# Patient Record
Sex: Female | Born: 1950 | Race: White | Hispanic: No | Smoking: Never smoker
Health system: Southern US, Community
[De-identification: ages and names within clinical notes are randomized; demographics above are authoritative.]

## PROBLEM LIST (undated history)

## (undated) DIAGNOSIS — F32A Depression, unspecified: Secondary | ICD-10-CM

## (undated) DIAGNOSIS — E785 Hyperlipidemia, unspecified: Secondary | ICD-10-CM

## (undated) DIAGNOSIS — E781 Pure hyperglyceridemia: Secondary | ICD-10-CM

## (undated) DIAGNOSIS — R519 Headache, unspecified: Secondary | ICD-10-CM

## (undated) DIAGNOSIS — R112 Nausea with vomiting, unspecified: Secondary | ICD-10-CM

## (undated) DIAGNOSIS — Z9889 Other specified postprocedural states: Secondary | ICD-10-CM

## (undated) DIAGNOSIS — K219 Gastro-esophageal reflux disease without esophagitis: Secondary | ICD-10-CM

## (undated) DIAGNOSIS — C55 Malignant neoplasm of uterus, part unspecified: Secondary | ICD-10-CM

## (undated) DIAGNOSIS — I10 Essential (primary) hypertension: Secondary | ICD-10-CM

## (undated) DIAGNOSIS — G2581 Restless legs syndrome: Secondary | ICD-10-CM

## (undated) DIAGNOSIS — N39490 Overflow incontinence: Secondary | ICD-10-CM

## (undated) DIAGNOSIS — E119 Type 2 diabetes mellitus without complications: Secondary | ICD-10-CM

## (undated) DIAGNOSIS — F419 Anxiety disorder, unspecified: Secondary | ICD-10-CM

## (undated) DIAGNOSIS — J439 Emphysema, unspecified: Secondary | ICD-10-CM

## (undated) DIAGNOSIS — I2510 Atherosclerotic heart disease of native coronary artery without angina pectoris: Secondary | ICD-10-CM

## (undated) DIAGNOSIS — R3915 Urgency of urination: Secondary | ICD-10-CM

## (undated) DIAGNOSIS — D649 Anemia, unspecified: Secondary | ICD-10-CM

## (undated) DIAGNOSIS — E039 Hypothyroidism, unspecified: Secondary | ICD-10-CM

## (undated) DIAGNOSIS — M1990 Unspecified osteoarthritis, unspecified site: Secondary | ICD-10-CM

## (undated) DIAGNOSIS — F329 Major depressive disorder, single episode, unspecified: Secondary | ICD-10-CM

## (undated) HISTORY — DX: Pure hyperglyceridemia: E78.1

## (undated) HISTORY — DX: Gastro-esophageal reflux disease without esophagitis: K21.9

## (undated) HISTORY — DX: Type 2 diabetes mellitus without complications: E11.9

## (undated) HISTORY — DX: Unspecified osteoarthritis, unspecified site: M19.90

## (undated) HISTORY — DX: Malignant neoplasm of uterus, part unspecified: C55

## (undated) HISTORY — DX: Depression, unspecified: F32.A

## (undated) HISTORY — DX: Hyperlipidemia, unspecified: E78.5

## (undated) HISTORY — DX: Restless legs syndrome: G25.81

## (undated) HISTORY — PX: FOOT SURGERY: SHX648

## (undated) HISTORY — DX: Overflow incontinence: N39.490

## (undated) HISTORY — DX: Hypothyroidism, unspecified: E03.9

## (undated) HISTORY — DX: Emphysema, unspecified: J43.9

## (undated) HISTORY — PX: TOTAL ABDOMINAL HYSTERECTOMY: SHX209

## (undated) HISTORY — PX: CORONARY/GRAFT ACUTE MI REVASCULARIZATION: CATH118305

## (undated) HISTORY — PX: CORONARY STENT PLACEMENT: SHX1402

## (undated) HISTORY — DX: Essential (primary) hypertension: I10

## (undated) HISTORY — PX: SPINE SURGERY: SHX786

## (undated) HISTORY — DX: Atherosclerotic heart disease of native coronary artery without angina pectoris: I25.10

## (undated) HISTORY — DX: Major depressive disorder, single episode, unspecified: F32.9

## (undated) HISTORY — PX: REPLACEMENT TOTAL KNEE: SUR1224

## (undated) HISTORY — PX: CHOLECYSTECTOMY: SHX55

---

## 2001-01-14 DIAGNOSIS — J189 Pneumonia, unspecified organism: Secondary | ICD-10-CM

## 2001-01-14 HISTORY — DX: Pneumonia, unspecified organism: J18.9

## 2013-01-14 DIAGNOSIS — D126 Benign neoplasm of colon, unspecified: Secondary | ICD-10-CM

## 2013-01-14 HISTORY — PX: TOTAL HIP ARTHROPLASTY: SHX124

## 2013-01-14 HISTORY — DX: Benign neoplasm of colon, unspecified: D12.6

## 2017-01-21 ENCOUNTER — Encounter: Payer: Self-pay | Admitting: Cardiovascular Disease

## 2017-01-21 ENCOUNTER — Encounter (INDEPENDENT_AMBULATORY_CARE_PROVIDER_SITE_OTHER): Payer: Self-pay

## 2017-01-21 ENCOUNTER — Ambulatory Visit: Payer: Medicare Other | Admitting: Cardiovascular Disease

## 2017-01-21 VITALS — BP 112/62 | HR 88 | Ht 64.0 in | Wt 181.8 lb

## 2017-01-21 DIAGNOSIS — E782 Mixed hyperlipidemia: Secondary | ICD-10-CM | POA: Diagnosis not present

## 2017-01-21 DIAGNOSIS — I2510 Atherosclerotic heart disease of native coronary artery without angina pectoris: Secondary | ICD-10-CM

## 2017-01-21 DIAGNOSIS — R0789 Other chest pain: Secondary | ICD-10-CM | POA: Diagnosis not present

## 2017-01-21 MED ORDER — NITROGLYCERIN 0.4 MG SL SUBL: 0.4000 mg | SUBLINGUAL_TABLET | SUBLINGUAL | 6 refills | Status: DC | PRN

## 2017-01-21 NOTE — Progress Notes (Signed)
Cardiology Office Note:    Date:  01/21/2017   ID:  Felicia [name], DOB 01-17-1950, MRN [id number]  PCP:  Felicia Kid, MD  Cardiologist:  Felicia Moores, MD    Referring MD: Felicia Kid, MD   Chief Complaint  Patient presents with  . Coronary Artery Disease   Problem list 1.  Coronary artery disease - March, 2017,  Resolute Integrity 2.25 x 18 mm stent - mid LAD  2.  Diabetes mellitus 3.  Hypothyroidism 4.  Hyperlipidemia  History of Present Illness:    Felicia [name] is a 67 y.o. female with a hx of coronary artery disease, diabetes mellitus, and hypothyroidism.  She recently moved from [place].  She is here to establish cardiology care.  Has had some unusual chest pressure .   Not exactily like her previous discomfort prior stenting . Had a stress echo which suggested  CAD.    Has chest pressure -   Band like sensation,  Does not exercise She does not think it worsens with exertion. ,  Does not radiate, not associated with increased dyspnea.  Not associated with presyncope .  The sensation comes and goes.   The pain has been present for the past 3 months .   Is on Bisoprilol 5 mg a day  Tried Metoprolol but this caused lots of fatigue and sleepyness.    Past Medical History:  Diagnosis Date  . CAD (coronary artery disease)   . Depression   . DM (diabetes mellitus) ([place])   . GERD (gastroesophageal reflux disease)   . High triglycerides   . Hyperlipidemia   . Hypothyroid   . OA (osteoarthritis)   . Overflow incontinence   . RLS (restless legs syndrome)   . Uterine cancer [facility])     Past Surgical History:  Procedure Laterality Date  . CORONARY STENT PLACEMENT    . CORONARY/GRAFT ACUTE MI REVASCULARIZATION    . FOOT SURGERY Left   . REPLACEMENT TOTAL KNEE Right   . SPINE SURGERY    . TOTAL ABDOMINAL HYSTERECTOMY     FOR UTERUS CANCER  . TOTAL HIP ARTHROPLASTY Left     Current Medications: Current Meds  Medication Sig  . aspirin EC 81 MG tablet Take 81  mg by mouth daily.  Marland Kitchen atorvastatin (LIPITOR) 40 MG tablet Take 40 mg by mouth daily.  . bisoprolol (ZEBETA) 5 MG tablet Take 5 mg by mouth daily.  . citalopram (CELEXA) 20 MG tablet Take 10 mg by mouth daily.   . empagliflozin (JARDIANCE) 10 MG TABS tablet Take 20 mg by mouth daily.   . Exenatide ER (BYDUREON) 2 MG PEN Inject into the skin.  Marland Kitchen glimepiride (AMARYL) 4 MG tablet Take 4 mg by mouth daily with breakfast.  . levothyroxine (SYNTHROID, LEVOTHROID) 125 MCG tablet Take 125 mcg by mouth daily before breakfast.  . metFORMIN (GLUCOPHAGE) 500 MG tablet TAKE 2 TABLETS IN THE MORNING AND TAKE 2 TABLETS IN THE AFTERNOON BY MOUTH  . omeprazole (PRILOSEC) 40 MG capsule Take 40 mg by mouth daily.  Marland Kitchen rOPINIRole (REQUIP) 0.5 MG tablet 0.5 mg. TAKE 1 TABLET 1 TO 3 HOURS BY MOUTH BEFORE BEDTIME     Allergies:   Patient has no known allergies.   Social History   Socioeconomic History  . Marital status: Divorced    Spouse name: None  . Number of children: None  . Years of education: None  . Highest education level: None  Social Needs  . Financial resource strain:  None  . Food insecurity - worry: None  . Food insecurity - inability: None  . Transportation needs - medical: None  . Transportation needs - non-medical: None  Occupational History  . None  Tobacco Use  . Smoking status: Never Smoker  . Smokeless tobacco: Never Used  Substance and Sexual Activity  . Alcohol use: Yes    Comment: OCCASIONALLY  . Drug use: No  . Sexual activity: None  Other Topics Concern  . None  Social History Narrative  . None     Family History: The patient's family history includes AAA (abdominal aortic aneurysm) in her mother; Diabetes in her father. ROS:   Please see the history of present illness.     All other systems reviewed and are negative.  EKGs/Labs/Other Studies Reviewed:    The following studies were reviewed today:   EKG:  EKG is  ordered today.  The ekg ordered today  demonstrates   NSR at at 75.   No ST or T wave change.  Recent Labs: No results found for requested labs within last 8760 hours.  Recent Lipid Panel No results found for: CHOL, TRIG, HDL, CHOLHDL, VLDL, LDLCALC, LDLDIRECT  Physical Exam:    VS:  BP 112/62 (BP Location: Left Arm)   Pulse 88   Ht 5\' 4"  (1.626 m)   Wt 181 lb 12.8 oz (82.5 kg)   SpO2 98%   BMI 31.21 kg/m     Wt Readings from Last 3 Encounters:  01/21/17 181 lb 12.8 oz (82.5 kg)     GEN:  Well nourished, well developed in no acute distress HEENT: Normal NECK: No JVD; No carotid bruits LYMPHATICS: No lymphadenopathy CARDIAC: RR, no murmurs, rubs, gallops RESPIRATORY:  Clear to auscultation without rales, wheezing or rhonchi  ABDOMEN: Soft, non-tender, non-distended MUSCULOSKELETAL:  No edema; No deformity  SKIN: Warm and dry NEUROLOGIC:  Alert and oriented x 3 PSYCHIATRIC:  Normal affect   ASSESSMENT:    No diagnosis found. PLAN:    In order of problems listed above:  1. Coronary artery disease: [name] presents with a history of coronary artery disease.  She has a drug-eluting stent in her mid LAD.  She now presents with bandlike chest discomfort.  The discomfort is not exactly like her presenting episodes of chest pain but does have some concerning characteristics.  She does not think that it worsens with exertion but she admits that she does not exercise at all. We will get a stress Myoview study for further evaluation.  2.  Hyperlipidemia: Continue atorvastatin  We will see her again in 3 months for follow-up visit.  Medication Adjustments/Labs and Tests Ordered: Current medicines are reviewed at length with the patient today.  Concerns regarding medicines are outlined above.  No orders of the defined types were placed in this encounter.  No orders of the defined types were placed in this encounter.   Signed, Felicia Moores, MD  01/21/2017 3:34 PM    [place]

## 2017-01-21 NOTE — Patient Instructions (Signed)
Medication Instructions:  Your physician recommends that you continue on your current medications as directed. Please refer to the Current Medication list given to you today.   Labwork: None Ordered   Testing/Procedures: Your physician has requested that you have an exercise stress myoview. For further information please visit [email or web address]. Please follow instruction sheet, as given.    Follow-Up: Your physician recommends that you schedule a follow-up appointment in: 3 months with Dr. Acie Fredrickson   If you need a refill on your cardiac medications before your next appointment, please call your pharmacy.   Thank you for choosing [facility]! Christen Bame, RN [phone]

## 2017-01-22 ENCOUNTER — Telehealth (HOSPITAL_COMMUNITY): Payer: Self-pay | Admitting: *Deleted

## 2017-01-22 NOTE — Telephone Encounter (Signed)
Patient given detailed instructions per Myocardial Perfusion Study Information Sheet for the test on 01/23/17 at 7:45. Patient notified to arrive 15 minutes early and that it is imperative to arrive on time for appointment to keep from having the test rescheduled.  If you need to cancel or reschedule your appointment, please call the office within 24 hours of your appointment. . Patient verbalized understanding.Felicia [name]

## 2017-01-23 ENCOUNTER — Ambulatory Visit (HOSPITAL_COMMUNITY): Payer: Medicare Other | Attending: Cardiovascular Disease

## 2017-01-23 DIAGNOSIS — R0789 Other chest pain: Secondary | ICD-10-CM

## 2017-01-23 DIAGNOSIS — E119 Type 2 diabetes mellitus without complications: Secondary | ICD-10-CM | POA: Diagnosis not present

## 2017-01-23 DIAGNOSIS — I2510 Atherosclerotic heart disease of native coronary artery without angina pectoris: Secondary | ICD-10-CM | POA: Diagnosis not present

## 2017-01-23 DIAGNOSIS — R079 Chest pain, unspecified: Secondary | ICD-10-CM | POA: Insufficient documentation

## 2017-01-23 DIAGNOSIS — R9439 Abnormal result of other cardiovascular function study: Secondary | ICD-10-CM | POA: Diagnosis not present

## 2017-01-23 LAB — MYOCARDIAL PERFUSION IMAGING
CHL CUP MPHR: 153 {beats}/min
CHL CUP RESTING HR STRESS: 90 {beats}/min
CSEPED: 5 min
CSEPEDS: 0 s
CSEPHR: 107 %
Estimated workload: 4.6 METS
LV sys vol: 10 mL
LVDIAVOL: 50 mL (ref 46–106)
Peak HR: 164 {beats}/min
RATE: 0.43
SDS: 8
SRS: 10
SSS: 18
TID: 0.74

## 2017-01-23 MED ORDER — TECHNETIUM TC 99M TETROFOSMIN IV KIT
10.7000 | PACK | Freq: Once | INTRAVENOUS | Status: AC | PRN
  Administered 2017-01-23: 10.7 via INTRAVENOUS
  Filled 2017-01-23: qty 11

## 2017-01-23 MED ORDER — TECHNETIUM TC 99M TETROFOSMIN IV KIT
30.5000 | PACK | Freq: Once | INTRAVENOUS | Status: AC | PRN
  Administered 2017-01-23: 30.5 via INTRAVENOUS
  Filled 2017-01-23: qty 31

## 2017-01-23 NOTE — Addendum Note (Signed)
Addended by: De Burrs on: 01/23/2017 01:24 PM   Modules accepted: Orders

## 2017-01-24 ENCOUNTER — Other Ambulatory Visit: Payer: Self-pay | Admitting: Nurse Practitioner

## 2017-01-24 MED ORDER — ATORVASTATIN CALCIUM 40 MG PO TABS: 40.0000 mg | ORAL_TABLET | Freq: Every day | ORAL | 3 refills | Status: DC

## 2017-01-24 MED ORDER — BISOPROLOL FUMARATE 5 MG PO TABS: 5.0000 mg | ORAL_TABLET | Freq: Every day | ORAL | 3 refills | Status: DC

## 2017-04-23 ENCOUNTER — Encounter: Payer: Self-pay | Admitting: Cardiovascular Disease

## 2017-04-29 ENCOUNTER — Ambulatory Visit: Payer: Medicare Other | Admitting: Cardiovascular Disease

## 2017-05-06 ENCOUNTER — Ambulatory Visit: Payer: Medicare Other | Admitting: Cardiovascular Disease

## 2017-05-06 ENCOUNTER — Encounter: Payer: Self-pay | Admitting: Cardiovascular Disease

## 2017-05-06 VITALS — BP 130/80 | HR 73 | Ht 64.0 in | Wt 189.0 lb

## 2017-05-06 DIAGNOSIS — E782 Mixed hyperlipidemia: Secondary | ICD-10-CM

## 2017-05-06 DIAGNOSIS — I2510 Atherosclerotic heart disease of native coronary artery without angina pectoris: Secondary | ICD-10-CM | POA: Diagnosis not present

## 2017-05-06 LAB — BASIC METABOLIC PANEL
BUN/Creatinine Ratio: 22 (ref 12–28)
BUN: 14 mg/dL (ref 8–27)
CO2: 22 mmol/L (ref 20–29)
CREATININE: 0.65 mg/dL (ref 0.57–1.00)
Calcium: 10.5 mg/dL — ABNORMAL HIGH (ref 8.7–10.3)
Chloride: 99 mmol/L (ref 96–106)
GFR calc Af Amer: 106 mL/min/{1.73_m2} (ref >59)
GFR, EST NON AFRICAN AMERICAN: 92 mL/min/{1.73_m2} (ref >59)
Glucose: 198 mg/dL — ABNORMAL HIGH (ref 65–99)
POTASSIUM: 4.6 mmol/L (ref 3.5–5.2)
Sodium: 138 mmol/L (ref 134–144)

## 2017-05-06 LAB — HEPATIC FUNCTION PANEL
ALT: 35 IU/L — ABNORMAL HIGH (ref 0–32)
AST: 21 IU/L (ref 0–40)
Albumin: 4.3 g/dL (ref 3.6–4.8)
Alkaline Phosphatase: 68 IU/L (ref 39–117)
Bilirubin Total: <0.2 mg/dL (ref 0.0–1.2)
Bilirubin, Direct: 0.08 mg/dL (ref 0.00–0.40)
Total Protein: 7.1 g/dL (ref 6.0–8.5)

## 2017-05-06 LAB — LIPID PANEL
Chol/HDL Ratio: 4.2 ratio (ref 0.0–4.4)
Cholesterol, Total: 196 mg/dL (ref 100–199)
HDL: 47 mg/dL (ref >39)
Triglycerides: 630 mg/dL (ref 0–149)

## 2017-05-06 NOTE — Patient Instructions (Signed)
Medication Instructions:  Your physician recommends that you continue on your current medications as directed. Please refer to the Current Medication list given to you today.   Labwork: TODAY - cholesterol, liver panel, basic metabolic panel   Testing/Procedures: None Ordered   Follow-Up: Your physician wants you to follow-up in: 6 months with a PA or Nurse Practitioner on Dr. Elmarie Shiley team. You will receive a reminder letter in the mail two months in advance. If you don't receive a letter, please call our office to schedule the follow-up appointment.   If you need a refill on your cardiac medications before your next appointment, please call your pharmacy.   Thank you for choosing [facility]! Christen Bame, RN [phone]

## 2017-05-06 NOTE — Progress Notes (Signed)
Cardiology Office Note:    Date:  05/06/2017   ID:  Felicia [name], DOB 05/17/1950, MRN [id number]  PCP:  Dineen Kid, MD  Cardiologist:  Mertie Moores, MD    Referring MD: Dineen Kid, MD   Chief Complaint  Patient presents with  . Coronary Artery Disease   Problem list 1.  Coronary artery disease - March, 2017,  Resolute Integrity 2.25 x 18 mm stent - mid LAD  2.  Diabetes mellitus 3.  Hypothyroidism 4.  Hyperlipidemia  History of Present Illness:    Felicia [name] is a 67 y.o. female with a hx of coronary artery disease, diabetes mellitus, and hypothyroidism.  She recently moved from [place].  She is here to establish cardiology care.  Has had some unusual chest pressure .   Not exactily like her previous discomfort prior stenting . Had a stress echo which suggested  CAD.    Has chest pressure -   Band like sensation,  Does not exercise She does not think it worsens with exertion. ,  Does not radiate, not associated with increased dyspnea.  Not associated with presyncope .  The sensation comes and goes.   The pain has been present for the past 3 months .   Is on Bisoprilol 5 mg a day  Tried Metoprolol but this caused lots of fatigue and sleepyness.   May 06, 2017: Our were seen today for follow-up of her coronary artery disease.  She is status post stenting of her mid LAD March 2017. Is a history of hyperlipidemia. No angina, Exercising in the pool regularly  Has lots of arthritis .  Suggested a stationary bike or elliptical bike   Past Medical History:  Diagnosis Date  . CAD (coronary artery disease)   . Depression   . DM (diabetes mellitus) ([place])   . GERD (gastroesophageal reflux disease)   . High triglycerides   . Hyperlipidemia   . Hypothyroid   . OA (osteoarthritis)   . Overflow incontinence   . RLS (restless legs syndrome)   . Uterine cancer [facility])     Past Surgical History:  Procedure Laterality Date  . CORONARY STENT PLACEMENT    .  CORONARY/GRAFT ACUTE MI REVASCULARIZATION    . FOOT SURGERY Left   . REPLACEMENT TOTAL KNEE Right   . SPINE SURGERY    . TOTAL ABDOMINAL HYSTERECTOMY     FOR UTERUS CANCER  . TOTAL HIP ARTHROPLASTY Left     Current Medications: Current Meds  Medication Sig  . aspirin EC 81 MG tablet Take 81 mg by mouth daily.  Marland Kitchen atorvastatin (LIPITOR) 40 MG tablet Take 1 tablet (40 mg total) by mouth daily.  . bisoprolol (ZEBETA) 5 MG tablet Take 1 tablet (5 mg total) by mouth daily.  . citalopram (CELEXA) 20 MG tablet Take 10 mg by mouth daily.   . Dulaglutide (TRULICITY) 1.5 UX/3.2GM SOPN Inject into the skin as directed.  . empagliflozin (JARDIANCE) 10 MG TABS tablet Take 20 mg by mouth daily.   . Exenatide (BYDUREON [place]) Inject into the skin as directed.  Marland Kitchen glimepiride (AMARYL) 4 MG tablet Take 4 mg by mouth as directed. Sometimes takes an extra half at supper if sugar level high  . levothyroxine (SYNTHROID, LEVOTHROID) 125 MCG tablet Take 125 mcg by mouth daily before breakfast.  . metFORMIN (GLUCOPHAGE) 500 MG tablet TAKE 2 TABLETS IN THE MORNING AND TAKE 2 TABLETS IN THE AFTERNOON BY MOUTH  . nitroGLYCERIN (NITROSTAT) 0.4 MG SL tablet Place  1 tablet (0.4 mg total) under the tongue every 5 (five) minutes as needed for chest pain.  Marland Kitchen omeprazole (PRILOSEC) 40 MG capsule Take 40 mg by mouth daily.  Marland Kitchen rOPINIRole (REQUIP) 0.5 MG tablet 0.5 mg. TAKE 1 TABLET 1 TO 3 HOURS BY MOUTH BEFORE BEDTIME     Allergies:   Patient has no known allergies.   Social History   Socioeconomic History  . Marital status: Divorced    Spouse name: Not on file  . Number of children: Not on file  . Years of education: Not on file  . Highest education level: Not on file  Occupational History  . Not on file  Social Needs  . Financial resource strain: Not on file  . Food insecurity:    Worry: Not on file    Inability: Not on file  . Transportation needs:    Medical: Not on file    Non-medical: Not on file  Tobacco  Use  . Smoking status: Never Smoker  . Smokeless tobacco: Never Used  Substance and Sexual Activity  . Alcohol use: Yes    Comment: OCCASIONALLY  . Drug use: No  . Sexual activity: Not on file  Lifestyle  . Physical activity:    Days per week: Not on file    Minutes per session: Not on file  . Stress: Not on file  Relationships  . Social connections:    Talks on phone: Not on file    Gets together: Not on file    Attends religious service: Not on file    Active member of club or organization: Not on file    Attends meetings of clubs or organizations: Not on file    Relationship status: Not on file  Other Topics Concern  . Not on file  Social History Narrative  . Not on file     Family History: The patient's family history includes AAA (abdominal aortic aneurysm) in her mother; Diabetes in her father. ROS:   Please see the history of present illness.     All other systems reviewed and are negative.    Physical Exam: Blood pressure 130/80, pulse 73, height 5\' 4"  (1.626 m), weight 189 lb (85.7 kg), SpO2 97 %.  GEN:  Well nourished, well developed in no acute distress HEENT: Normal NECK: No JVD; No carotid bruits LYMPHATICS: No lymphadenopathy CARDIAC: RRR   RESPIRATORY:  Clear to auscultation without rales, wheezing or rhonchi  ABDOMEN: Soft, non-tender, non-distended MUSCULOSKELETAL:  No edema; No deformity  SKIN: Warm and dry NEUROLOGIC:  Alert and oriented x 3    Recent Labs: No results found for requested labs within last 8760 hours.  Recent Lipid Panel No results found for: CHOL, TRIG, HDL, CHOLHDL, VLDL, LDLCALC, LDLDIRECT    EKGs/Labs/Other Studies Reviewed:    EKG:       ASSESSMENT:    No diagnosis found. PLAN:      1.   Coronary artery disease:  S/p stenting of her mid LAD  2.  Hyperlipidemia:   Continue atorvastatin.  Will check labs today.  I have suggested that she increase her exercise.  3. Dizziness:   Has dizziness at night ,   ?   May be related to orthostatic hypotension.   Also has vertigo symptoms     Medication Adjustments/Labs and Tests Ordered: Current medicines are reviewed at length with the patient today.  Concerns regarding medicines are outlined above.  No orders of the defined types were placed in this  encounter.  No orders of the defined types were placed in this encounter.   Signed, Mertie Moores, MD  05/06/2017 9:28 AM    [place]

## 2017-05-07 ENCOUNTER — Telehealth: Payer: Self-pay

## 2017-05-07 MED ORDER — FENOFIBRATE 145 MG PO TABS: 145.0000 mg | ORAL_TABLET | Freq: Every day | ORAL | 3 refills | Status: DC

## 2017-05-07 NOTE — Telephone Encounter (Signed)
-----   Message from Thayer Headings, MD sent at 05/07/2017  9:13 AM EDT ----- Katherene Ponto are extremely elevated.  She needs to start Fenofibrate 145 mg a day ( or alternatively the 160 mg tab, 135 mg tab are all equivilent doses.) Needs to greatly reduce her intake of bread, desserts, fried foods Needs to exercise much more

## 2017-05-07 NOTE — Telephone Encounter (Signed)
Informed patient of results and verbal understanding expressed.  Instructed patient to START FENOFIBRATE 145 mg daily. She understands the exact dosage may be a little different based on insurance coverage. She will reduce her intake of carbohydrates and fried foods and exercise more. She understands Dr. Elmarie Shiley nurse will call her if fasting labs are needed prior to next appointment in 6 months. She was grateful for call and agrees with treatment plan.

## 2017-05-08 ENCOUNTER — Other Ambulatory Visit: Payer: Self-pay | Admitting: Nurse Practitioner

## 2017-05-08 DIAGNOSIS — E781 Pure hyperglyceridemia: Secondary | ICD-10-CM

## 2017-05-28 ENCOUNTER — Other Ambulatory Visit (HOSPITAL_COMMUNITY): Payer: Self-pay | Admitting: Orthopedic Surgery

## 2017-05-28 DIAGNOSIS — Z96651 Presence of right artificial knee joint: Secondary | ICD-10-CM

## 2017-05-28 DIAGNOSIS — M25561 Pain in right knee: Secondary | ICD-10-CM

## 2017-06-05 ENCOUNTER — Ambulatory Visit (HOSPITAL_COMMUNITY): Payer: Medicare Other

## 2017-06-05 ENCOUNTER — Ambulatory Visit (HOSPITAL_COMMUNITY)
Admission: RE | Admit: 2017-06-05 | Discharge: 2017-06-05 | Disposition: A | Discharge status: Home / Self Care | Payer: Medicare Other | Source: Ambulatory Visit | Attending: Orthopedic Surgery | Admitting: Orthopedic Surgery

## 2017-06-05 DIAGNOSIS — Z96651 Presence of right artificial knee joint: Secondary | ICD-10-CM | POA: Diagnosis present

## 2017-06-05 DIAGNOSIS — M25561 Pain in right knee: Secondary | ICD-10-CM | POA: Diagnosis present

## 2017-06-05 MED ORDER — TECHNETIUM TC 99M MEDRONATE IV KIT
20.0000 | PACK | Freq: Once | INTRAVENOUS | Status: AC | PRN
  Administered 2017-06-05: 20 via INTRAVENOUS

## 2017-07-07 ENCOUNTER — Other Ambulatory Visit: Payer: Self-pay | Admitting: Family Medicine

## 2017-07-07 DIAGNOSIS — Z8249 Family history of ischemic heart disease and other diseases of the circulatory system: Secondary | ICD-10-CM

## 2017-07-24 ENCOUNTER — Ambulatory Visit
Admission: RE | Admit: 2017-07-24 | Discharge: 2017-07-24 | Disposition: A | Discharge status: Home / Self Care | Payer: Medicare Other | Source: Ambulatory Visit | Attending: Family Medicine | Admitting: Family Medicine

## 2017-07-24 DIAGNOSIS — Z8249 Family history of ischemic heart disease and other diseases of the circulatory system: Secondary | ICD-10-CM

## 2017-08-06 ENCOUNTER — Other Ambulatory Visit: Payer: Self-pay | Admitting: Obstetrics & Gynecology

## 2017-08-13 ENCOUNTER — Other Ambulatory Visit: Payer: Medicare Other | Admitting: *Deleted

## 2017-08-13 DIAGNOSIS — E781 Pure hyperglyceridemia: Secondary | ICD-10-CM

## 2017-08-13 LAB — HEPATIC FUNCTION PANEL
ALBUMIN: 4.5 g/dL (ref 3.6–4.8)
ALT: 40 IU/L — ABNORMAL HIGH (ref 0–32)
AST: 25 IU/L (ref 0–40)
Alkaline Phosphatase: 47 IU/L (ref 39–117)
Bilirubin Total: 0.2 mg/dL (ref 0.0–1.2)
Bilirubin, Direct: 0.12 mg/dL (ref 0.00–0.40)
TOTAL PROTEIN: 7 g/dL (ref 6.0–8.5)

## 2017-08-13 LAB — LIPID PANEL
CHOL/HDL RATIO: 3.9 ratio (ref 0.0–4.4)
Cholesterol, Total: 183 mg/dL (ref 100–199)
HDL: 47 mg/dL (ref >39)
LDL CALC: 61 mg/dL (ref 0–99)
TRIGLYCERIDES: 375 mg/dL — AB (ref 0–149)
VLDL CHOLESTEROL CAL: 75 mg/dL — AB (ref 5–40)

## 2017-08-13 LAB — BASIC METABOLIC PANEL
BUN/Creatinine Ratio: 22 (ref 12–28)
BUN: 19 mg/dL (ref 8–27)
CO2: 22 mmol/L (ref 20–29)
Calcium: 10.1 mg/dL (ref 8.7–10.3)
Chloride: 100 mmol/L (ref 96–106)
Creatinine, Ser: 0.88 mg/dL (ref 0.57–1.00)
GFR calc Af Amer: 79 mL/min/{1.73_m2} (ref >59)
GFR calc non Af Amer: 68 mL/min/{1.73_m2} (ref >59)
Glucose: 196 mg/dL — ABNORMAL HIGH (ref 65–99)
POTASSIUM: 4.3 mmol/L (ref 3.5–5.2)
SODIUM: 140 mmol/L (ref 134–144)

## 2017-11-07 ENCOUNTER — Ambulatory Visit: Payer: Medicare Other | Admitting: Cardiology

## 2017-11-18 ENCOUNTER — Encounter (INDEPENDENT_AMBULATORY_CARE_PROVIDER_SITE_OTHER): Payer: Self-pay

## 2017-11-18 ENCOUNTER — Encounter: Payer: Self-pay | Admitting: Cardiology

## 2017-11-18 ENCOUNTER — Ambulatory Visit: Payer: Medicare Other | Admitting: Cardiology

## 2017-11-18 VITALS — BP 122/62 | HR 90 | Ht 64.0 in | Wt 184.8 lb

## 2017-11-18 DIAGNOSIS — E785 Hyperlipidemia, unspecified: Secondary | ICD-10-CM | POA: Diagnosis not present

## 2017-11-18 DIAGNOSIS — E119 Type 2 diabetes mellitus without complications: Secondary | ICD-10-CM | POA: Insufficient documentation

## 2017-11-18 DIAGNOSIS — Z7984 Long term (current) use of oral hypoglycemic drugs: Secondary | ICD-10-CM | POA: Insufficient documentation

## 2017-11-18 DIAGNOSIS — I2510 Atherosclerotic heart disease of native coronary artery without angina pectoris: Secondary | ICD-10-CM | POA: Diagnosis not present

## 2017-11-18 DIAGNOSIS — R42 Dizziness and giddiness: Secondary | ICD-10-CM | POA: Insufficient documentation

## 2017-11-18 NOTE — Progress Notes (Signed)
Cardiology Office Note:    Date:  11/18/2017   ID:  Felicia [name], DOB 01/04/1951, MRN [id number]  PCP:  Dineen Kid, MD  Cardiologist:  Mertie Moores, MD  Referring MD: Dineen Kid, MD   Chief Complaint  Patient presents with  . Follow-up    CAD    History of Present Illness:    Felicia [name] is a 67 y.o. female with a past medical history significant for CAD S/P DES to LAD 03/2015, hyperlipidemia, DM, and hypothyroidism.  The patient relocated here from [place].  She was last seen in the office by Dr. Acie Fredrickson on 05/06/2017 at which time labs were checked and she was noted to have elevated triglycerides so fenofibrate was added.  She was also having some dizziness at night, potentially related to orthostatic hypotension.  The patient also had vertigo symptoms.  She is here today for six-month follow-up. She is again complaining of being dizzy when she lies down at night, lasting for about 5 minutes. About every other night. Room spinning. Has had balance issues since her cervical spine surgery in [place].   No chest pain, shortness of breath, palpitations, lightheadedness, syncope.   Hasn't been exercising lately. Has just started a part time job and hopes it will have her be more active.   Past Medical History:  Diagnosis Date  . CAD (coronary artery disease)   . Depression   . DM (diabetes mellitus) ([place])   . GERD (gastroesophageal reflux disease)   . High triglycerides   . Hyperlipidemia   . Hypothyroid   . OA (osteoarthritis)   . Overflow incontinence   . RLS (restless legs syndrome)   . Uterine cancer [facility])     Past Surgical History:  Procedure Laterality Date  . CORONARY STENT PLACEMENT    . CORONARY/GRAFT ACUTE MI REVASCULARIZATION    . FOOT SURGERY Left   . REPLACEMENT TOTAL KNEE Right   . SPINE SURGERY    . TOTAL ABDOMINAL HYSTERECTOMY     FOR UTERUS CANCER  . TOTAL HIP ARTHROPLASTY Left     Current Medications: Current Meds  Medication Sig    . aspirin EC 81 MG tablet Take 81 mg by mouth daily.  Marland Kitchen atorvastatin (LIPITOR) 40 MG tablet Take 1 tablet (40 mg total) by mouth daily.  . bisoprolol (ZEBETA) 5 MG tablet Take 1 tablet (5 mg total) by mouth daily.  . Dulaglutide (TRULICITY) 1.5 OE/7.0JJ SOPN Inject into the skin as directed.  . fenofibrate (TRICOR) 145 MG tablet Take 1 tablet (145 mg total) by mouth daily.  Marland Kitchen GLIPIZIDE XL 10 MG 24 hr tablet TAKE 1 TABLET BY MOUTH ONCE DAILY WITH BREAKFAST FOR 90 DAYS  . JARDIANCE 25 MG TABS tablet TAKE 1 TABLET BY MOUTH ONCE DAILY IN THE MORNING  . levothyroxine (SYNTHROID, LEVOTHROID) 125 MCG tablet Take 125 mcg by mouth daily before breakfast.  . metFORMIN (GLUCOPHAGE) 500 MG tablet TAKE 2 TABLETS IN THE MORNING AND TAKE 2 TABLETS IN THE AFTERNOON BY MOUTH  . nitroGLYCERIN (NITROSTAT) 0.4 MG SL tablet Place 1 tablet (0.4 mg total) under the tongue every 5 (five) minutes as needed for chest pain.  . nortriptyline (PAMELOR) 25 MG capsule TAKE 1 CAPSULE BY MOUTH ONCE DAILY AT BEDTIME FOR 30 DAYS  . omeprazole (PRILOSEC) 40 MG capsule Take 40 mg by mouth daily.  Marland Kitchen rOPINIRole (REQUIP) 0.5 MG tablet 0.5 mg. TAKE 1 TABLET 1 TO 3 HOURS BY MOUTH BEFORE BEDTIME     Allergies:  Patient has no known allergies.   Social History   Socioeconomic History  . Marital status: Divorced    Spouse name: Not on file  . Number of children: Not on file  . Years of education: Not on file  . Highest education level: Not on file  Occupational History  . Not on file  Social Needs  . Financial resource strain: Not on file  . Food insecurity:    Worry: Not on file    Inability: Not on file  . Transportation needs:    Medical: Not on file    Non-medical: Not on file  Tobacco Use  . Smoking status: Never Smoker  . Smokeless tobacco: Never Used  Substance and Sexual Activity  . Alcohol use: Yes    Comment: OCCASIONALLY  . Drug use: No  . Sexual activity: Not on file  Lifestyle  . Physical activity:     Days per week: Not on file    Minutes per session: Not on file  . Stress: Not on file  Relationships  . Social connections:    Talks on phone: Not on file    Gets together: Not on file    Attends religious service: Not on file    Active member of club or organization: Not on file    Attends meetings of clubs or organizations: Not on file    Relationship status: Not on file  Other Topics Concern  . Not on file  Social History Narrative  . Not on file     Family History: The patient's family history includes AAA (abdominal aortic aneurysm) in her mother; Diabetes in her father. ROS:   Please see the history of present illness.     All other systems reviewed and are negative.  EKGs/Labs/Other Studies Reviewed:    The following studies were reviewed today:  Nuclear stress test 01/23/17 Study Highlights    Nuclear stress EF: 80%.  There was no ST segment deviation noted during stress.  No T wave inversion was noted during stress.  Defect 1: There is a small defect of mild severity present in the apical anterior location.  This is a low risk study.   There is small fixed anteroapical defect that may represent a small scar in the distal LAD territory, but is more likely due to breast attenuation artifact. No evidence of reversible ischemia. Low risk stress nuclear study with otherwise normal perfusion and normal left ventricular regional and global systolic function.       EKG:  EKG is ordered today.  The ekg ordered today demonstrates NSR with LAFB, QRS 90  Recent Labs: 08/13/2017: ALT 40; BUN 19; Creatinine, Ser 0.88; Potassium 4.3; Sodium 140   Recent Lipid Panel    Component Value Date/Time   CHOL 183 08/13/2017 0859   TRIG 375 (H) 08/13/2017 0859   HDL 47 08/13/2017 0859   CHOLHDL 3.9 08/13/2017 0859   LDLCALC 61 08/13/2017 0859    Physical Exam:    VS:  Ht 5\' 4"  (1.626 m)   Wt 184 lb 12.8 oz (83.8 kg)   SpO2 97%   BMI 31.72 kg/m     Wt Readings  from Last 3 Encounters:  11/18/17 184 lb 12.8 oz (83.8 kg)  05/06/17 189 lb (85.7 kg)  01/21/17 181 lb 12.8 oz (82.5 kg)    Orthostatic VS for the past 24 hrs (Last 3 readings):  BP- Lying Pulse- Lying BP- Sitting Pulse- Sitting BP- Standing at 0 minutes Pulse- Standing at  0 minutes BP- Standing at 3 minutes Pulse- Standing at 3 minutes  11/18/17 0930 128/78 71 123/70 73 117/66 84 120/77 80    Physical Exam  Constitutional: She is oriented to person, place, and time. She appears well-developed and well-nourished. No distress.  HENT:  Head: Normocephalic and atraumatic.  Neck: Normal range of motion. Neck supple. No JVD present.  Cardiovascular: Normal rate, regular rhythm, normal heart sounds and intact distal pulses. Exam reveals no gallop and no friction rub.  No murmur heard. Pulmonary/Chest: Effort normal and breath sounds normal. No respiratory distress. She has no wheezes. She has no rales.  Abdominal: Soft.  Musculoskeletal: Normal range of motion. She exhibits no edema or deformity.  Neurological: She is alert and oriented to person, place, and time.  Skin: Skin is warm and dry.  Psychiatric: She has a normal mood and affect. Her behavior is normal. Judgment and thought content normal.  Vitals reviewed.    ASSESSMENT:    1. Coronary artery disease involving native coronary artery of native heart without angina pectoris   2. Hyperlipidemia LDL goal <70   3. Type 2 diabetes mellitus without complication, without long-term current use of insulin ([place])   4. Dizziness    PLAN:    In order of problems listed above:  CAD S/P DES to LAD 2017: On aspirin, statin, beta-blocker. No anginal symptoms.   Hyperlipidemia: On atorvastatin and addition of fenofibrate in 05/03/2017 for elevated triglycerides >600. Labs at PCP 08/13/17 Trig 375, LDL 61. We had a long discussion on heart healthy diet, exercise and need for improved blood sugar.   Diabetes type 2:  Management per primary  care. A1c 8.8 09/30/17. She has been switched from amaryl to jardiance and glipizide, continues on metformin. Pt reports improved home blood sugars. Advised to start exercising.   Dizziness: spinning when lays down at night more consistent with vertigo. Pt has hx of poor balance since her cervical neck surgery. Advised to discuss with her PCP, may benefit from seeing a neurologist.    Medication Adjustments/Labs and Tests Ordered: Current medicines are reviewed at length with the patient today.  Concerns regarding medicines are outlined above. Labs and tests ordered and medication changes are outlined in the patient instructions below:  Patient Instructions  Medication Instructions:  Your physician recommends that you continue on your current medications as directed. Please refer to the Current Medication list given to you today.  If you need a refill on your cardiac medications before your next appointment, please call your pharmacy.   Lab work: None  If you have labs (blood work) drawn today and your tests are completely normal, you will receive your results only by: Marland Kitchen MyChart Message (if you have MyChart) OR . A paper copy in the mail If you have any lab test that is abnormal or we need to change your treatment, we will call you to review the results.  Testing/Procedures: None  Follow-Up: At [facility], you and your health needs are our priority.  As part of our continuing mission to provide you with exceptional heart care, we have created designated Provider Care Teams.  These Care Teams include your primary Cardiologist (physician) and Advanced Practice Providers (APPs -  Physician Assistants and Nurse Practitioners) who all work together to provide you with the care you need, when you need it. You will need a follow up appointment in:  6 months.  Please call our office 2 months in advance to schedule this appointment.  You may see Mertie Moores, MD or one of the following Advanced  Practice Providers on your designated Care Team: Richardson Dopp, PA-C [place], [place] . Daune Perch, NP  Any Other Special Instructions Will Be Listed Below (If Applicable).   DASH Eating Plan DASH stands for "Dietary Approaches to Stop Hypertension." The DASH eating plan is a healthy eating plan that has been shown to reduce high blood pressure (hypertension). It may also reduce your risk for type 2 diabetes, heart disease, and stroke. The DASH eating plan may also help with weight loss. What are tips for following this plan? General guidelines  Avoid eating more than 2,300 mg (milligrams) of salt (sodium) a day. If you have hypertension, you may need to reduce your sodium intake to 1,500 mg a day.  Limit alcohol intake to no more than 1 drink a day for nonpregnant women and 2 drinks a day for men. One drink equals 12 oz of beer, 5 oz of wine, or 1 oz of hard liquor.  Work with your health care provider to maintain a healthy body weight or to lose weight. Ask what an ideal weight is for you.  Get at least 30 minutes of exercise that causes your heart to beat faster (aerobic exercise) most days of the week. Activities may include walking, swimming, or biking.  Work with your health care provider or diet and nutrition specialist (dietitian) to adjust your eating plan to your individual calorie needs. Reading food labels  Check food labels for the amount of sodium per serving. Choose foods with less than 5 percent of the Daily Value of sodium. Generally, foods with less than 300 mg of sodium per serving fit into this eating plan.  To find whole grains, look for the word "whole" as the first word in the ingredient list. Shopping  Buy products labeled as "low-sodium" or "no salt added."  Buy fresh foods. Avoid canned foods and premade or frozen meals. Cooking  Avoid adding salt when cooking. Use salt-free seasonings or herbs instead of table salt or sea salt. Check with your health  care provider or pharmacist before using salt substitutes.  Do not fry foods. Cook foods using healthy methods such as baking, boiling, grilling, and broiling instead.  Cook with heart-healthy oils, such as olive, canola, soybean, or sunflower oil. Meal planning   Eat a balanced diet that includes: ? 5 or more servings of fruits and vegetables each day. At each meal, try to fill half of your plate with fruits and vegetables. ? Up to 6-8 servings of whole grains each day. ? Less than 6 oz of lean meat, poultry, or fish each day. A 3-oz serving of meat is about the same size as a deck of cards. One egg equals 1 oz. ? 2 servings of low-fat dairy each day. ? A serving of nuts, seeds, or beans 5 times each week. ? Heart-healthy fats. Healthy fats called Omega-3 fatty acids are found in foods such as flaxseeds and coldwater fish, like sardines, salmon, and mackerel.  Limit how much you eat of the following: ? Canned or prepackaged foods. ? Food that is high in trans fat, such as fried foods. ? Food that is high in saturated fat, such as fatty meat. ? Sweets, desserts, sugary drinks, and other foods with added sugar. ? Full-fat dairy products.  Do not salt foods before eating.  Try to eat at least 2 vegetarian meals each week.  Eat more home-cooked food and less restaurant,  buffet, and fast food.  When eating at a restaurant, ask that your food be prepared with less salt or no salt, if possible. What foods are recommended? The items listed may not be a complete list. Talk with your dietitian about what dietary choices are best for you. Grains Whole-grain or whole-wheat bread. Whole-grain or whole-wheat pasta. Brown rice. [place]. Bulgur. Whole-grain and low-sodium cereals. Pita bread. Low-fat, low-sodium crackers. Whole-wheat flour tortillas. Vegetables Fresh or frozen vegetables (raw, steamed, roasted, or grilled). Low-sodium or reduced-sodium tomato and vegetable juice.  Low-sodium or reduced-sodium tomato sauce and tomato paste. Low-sodium or reduced-sodium canned vegetables. Fruits All fresh, dried, or frozen fruit. Canned fruit in natural juice (without added sugar). Meat and other protein foods Skinless chicken or [place]. Ground chicken or [place]. Pork with fat trimmed off. Fish and seafood. Egg whites. Dried beans, peas, or lentils. Unsalted nuts, nut butters, and seeds. Unsalted canned beans. Lean cuts of beef with fat trimmed off. Low-sodium, lean deli meat. Dairy Low-fat (1%) or fat-free (skim) milk. Fat-free, low-fat, or reduced-fat cheeses. Nonfat, low-sodium ricotta or cottage cheese. Low-fat or nonfat yogurt. Low-fat, low-sodium cheese. Fats and oils Soft margarine without trans fats. Vegetable oil. Low-fat, reduced-fat, or light mayonnaise and salad dressings (reduced-sodium). Canola, safflower, olive, soybean, and sunflower oils. Avocado. Seasoning and other foods Herbs. Spices. Seasoning mixes without salt. Unsalted popcorn and pretzels. Fat-free sweets. What foods are not recommended? The items listed may not be a complete list. Talk with your dietitian about what dietary choices are best for you. Grains Baked goods made with fat, such as croissants, muffins, or some breads. Dry pasta or rice meal packs. Vegetables Creamed or fried vegetables. Vegetables in a cheese sauce. Regular canned vegetables (not low-sodium or reduced-sodium). Regular canned tomato sauce and paste (not low-sodium or reduced-sodium). Regular tomato and vegetable juice (not low-sodium or reduced-sodium). Angie Fava. Olives. Fruits Canned fruit in a light or heavy syrup. Fried fruit. Fruit in cream or butter sauce. Meat and other protein foods Fatty cuts of meat. Ribs. Fried meat. Berniece Salines. Sausage. Bologna and other processed lunch meats. Salami. Fatback. Hotdogs. Bratwurst. Salted nuts and seeds. Canned beans with added salt. Canned or smoked fish. Whole eggs or egg yolks. Chicken  or [place] with skin. Dairy Whole or 2% milk, cream, and half-and-half. Whole or full-fat cream cheese. Whole-fat or sweetened yogurt. Full-fat cheese. Nondairy creamers. Whipped toppings. Processed cheese and cheese spreads. Fats and oils Butter. Stick margarine. Lard. Shortening. Ghee. Bacon fat. Tropical oils, such as coconut, palm kernel, or palm oil. Seasoning and other foods Salted popcorn and pretzels. Onion salt, garlic salt, seasoned salt, table salt, and sea salt. Worcestershire sauce. Tartar sauce. Barbecue sauce. Teriyaki sauce. Soy sauce, including reduced-sodium. Steak sauce. Canned and packaged gravies. Fish sauce. Oyster sauce. Cocktail sauce. Horseradish that you find on the shelf. Ketchup. Mustard. Meat flavorings and tenderizers. Bouillon cubes. Hot sauce and Tabasco sauce. Premade or packaged marinades. Premade or packaged taco seasonings. Relishes. Regular salad dressings. Where to find more information:  National Heart, Lung, and [place]: https://wilson-eaton.com/  American Heart Association: www.heart.org Summary  The DASH eating plan is a healthy eating plan that has been shown to reduce high blood pressure (hypertension). It may also reduce your risk for type 2 diabetes, heart disease, and stroke.  With the DASH eating plan, you should limit salt (sodium) intake to 2,300 mg a day. If you have hypertension, you may need to reduce your sodium intake to 1,500 mg a day.  When  on the DASH eating plan, aim to eat more fresh fruits and vegetables, whole grains, lean proteins, low-fat dairy, and heart-healthy fats.  Work with your health care provider or diet and nutrition specialist (dietitian) to adjust your eating plan to your individual calorie needs. This information is not intended to replace advice given to you by your health care provider. Make sure you discuss any questions you have with your health care provider. Document Released: 12/20/2010 Document Revised:  12/25/2015 Document Reviewed: 12/25/2015 Elsevier Interactive Patient Education  2018 [place], Daune Perch, NP  11/18/2017 1:00 PM    [place]

## 2017-11-18 NOTE — Patient Instructions (Signed)
Medication Instructions:  Your physician recommends that you continue on your current medications as directed. Please refer to the Current Medication list given to you today.  If you need a refill on your cardiac medications before your next appointment, please call your pharmacy.   Lab work: None  If you have labs (blood work) drawn today and your tests are completely normal, you will receive your results only by: . MyChart Message (if you have MyChart) OR . A paper copy in the mail If you have any lab test that is abnormal or we need to change your treatment, we will call you to review the results.  Testing/Procedures: None  Follow-Up: At [facility], you and your health needs are our priority.  As part of our continuing mission to provide you with exceptional heart care, we have created designated Provider Care Teams.  These Care Teams include your primary Cardiologist (physician) and Advanced Practice Providers (APPs -  Physician Assistants and Nurse Practitioners) who all work together to provide you with the care you need, when you need it. You will need a follow up appointment in:  6 months.  Please call our office 2 months in advance to schedule this appointment.  You may see Philip Nahser, MD or one of the following Advanced Practice Providers on your designated Care Team: Scott Weaver, PA-C Vin Bhagat, PA-C . Janine Hammond, NP  Any Other Special Instructions Will Be Listed Below (If Applicable).  DASH Eating Plan DASH stands for "Dietary Approaches to Stop Hypertension." The DASH eating plan is a healthy eating plan that has been shown to reduce high blood pressure (hypertension). It may also reduce your risk for type 2 diabetes, heart disease, and stroke. The DASH eating plan may also help with weight loss. What are tips for following this plan? General guidelines  Avoid eating more than 2,300 mg (milligrams) of salt (sodium) a day. If you have hypertension, you may need  to reduce your sodium intake to 1,500 mg a day.  Limit alcohol intake to no more than 1 drink a day for nonpregnant women and 2 drinks a day for men. One drink equals 12 oz of beer, 5 oz of wine, or 1 oz of hard liquor.  Work with your health care provider to maintain a healthy body weight or to lose weight. Ask what an ideal weight is for you.  Get at least 30 minutes of exercise that causes your heart to beat faster (aerobic exercise) most days of the week. Activities may include walking, swimming, or biking.  Work with your health care provider or diet and nutrition specialist (dietitian) to adjust your eating plan to your individual calorie needs. Reading food labels  Check food labels for the amount of sodium per serving. Choose foods with less than 5 percent of the Daily Value of sodium. Generally, foods with less than 300 mg of sodium per serving fit into this eating plan.  To find whole grains, look for the word "whole" as the first word in the ingredient list. Shopping  Buy products labeled as "low-sodium" or "no salt added."  Buy fresh foods. Avoid canned foods and premade or frozen meals. Cooking  Avoid adding salt when cooking. Use salt-free seasonings or herbs instead of table salt or sea salt. Check with your health care provider or pharmacist before using salt substitutes.  Do not fry foods. Cook foods using healthy methods such as baking, boiling, grilling, and broiling instead.  Cook with heart-healthy oils, such as   olive, canola, soybean, or sunflower oil. Meal planning   Eat a balanced diet that includes: ? 5 or more servings of fruits and vegetables each day. At each meal, try to fill half of your plate with fruits and vegetables. ? Up to 6-8 servings of whole grains each day. ? Less than 6 oz of lean meat, poultry, or fish each day. A 3-oz serving of meat is about the same size as a deck of cards. One egg equals 1 oz. ? 2 servings of low-fat dairy each day. ? A  serving of nuts, seeds, or beans 5 times each week. ? Heart-healthy fats. Healthy fats called Omega-3 fatty acids are found in foods such as flaxseeds and coldwater fish, like sardines, salmon, and mackerel.  Limit how much you eat of the following: ? Canned or prepackaged foods. ? Food that is high in trans fat, such as fried foods. ? Food that is high in saturated fat, such as fatty meat. ? Sweets, desserts, sugary drinks, and other foods with added sugar. ? Full-fat dairy products.  Do not salt foods before eating.  Try to eat at least 2 vegetarian meals each week.  Eat more home-cooked food and less restaurant, buffet, and fast food.  When eating at a restaurant, ask that your food be prepared with less salt or no salt, if possible. What foods are recommended? The items listed may not be a complete list. Talk with your dietitian about what dietary choices are best for you. Grains Whole-grain or whole-wheat bread. Whole-grain or whole-wheat pasta. Brown rice. Oatmeal. Quinoa. Bulgur. Whole-grain and low-sodium cereals. Pita bread. Low-fat, low-sodium crackers. Whole-wheat flour tortillas. Vegetables Fresh or frozen vegetables (raw, steamed, roasted, or grilled). Low-sodium or reduced-sodium tomato and vegetable juice. Low-sodium or reduced-sodium tomato sauce and tomato paste. Low-sodium or reduced-sodium canned vegetables. Fruits All fresh, dried, or frozen fruit. Canned fruit in natural juice (without added sugar). Meat and other protein foods Skinless chicken or turkey. Ground chicken or turkey. Pork with fat trimmed off. Fish and seafood. Egg whites. Dried beans, peas, or lentils. Unsalted nuts, nut butters, and seeds. Unsalted canned beans. Lean cuts of beef with fat trimmed off. Low-sodium, lean deli meat. Dairy Low-fat (1%) or fat-free (skim) milk. Fat-free, low-fat, or reduced-fat cheeses. Nonfat, low-sodium ricotta or cottage cheese. Low-fat or nonfat yogurt. Low-fat,  low-sodium cheese. Fats and oils Soft margarine without trans fats. Vegetable oil. Low-fat, reduced-fat, or light mayonnaise and salad dressings (reduced-sodium). Canola, safflower, olive, soybean, and sunflower oils. Avocado. Seasoning and other foods Herbs. Spices. Seasoning mixes without salt. Unsalted popcorn and pretzels. Fat-free sweets. What foods are not recommended? The items listed may not be a complete list. Talk with your dietitian about what dietary choices are best for you. Grains Baked goods made with fat, such as croissants, muffins, or some breads. Dry pasta or rice meal packs. Vegetables Creamed or fried vegetables. Vegetables in a cheese sauce. Regular canned vegetables (not low-sodium or reduced-sodium). Regular canned tomato sauce and paste (not low-sodium or reduced-sodium). Regular tomato and vegetable juice (not low-sodium or reduced-sodium). Pickles. Olives. Fruits Canned fruit in a light or heavy syrup. Fried fruit. Fruit in cream or butter sauce. Meat and other protein foods Fatty cuts of meat. Ribs. Fried meat. Bacon. Sausage. Bologna and other processed lunch meats. Salami. Fatback. Hotdogs. Bratwurst. Salted nuts and seeds. Canned beans with added salt. Canned or smoked fish. Whole eggs or egg yolks. Chicken or turkey with skin. Dairy Whole or 2% milk, cream, and   half-and-half. Whole or full-fat cream cheese. Whole-fat or sweetened yogurt. Full-fat cheese. Nondairy creamers. Whipped toppings. Processed cheese and cheese spreads. Fats and oils Butter. Stick margarine. Lard. Shortening. Ghee. Bacon fat. Tropical oils, such as coconut, palm kernel, or palm oil. Seasoning and other foods Salted popcorn and pretzels. Onion salt, garlic salt, seasoned salt, table salt, and sea salt. Worcestershire sauce. Tartar sauce. Barbecue sauce. Teriyaki sauce. Soy sauce, including reduced-sodium. Steak sauce. Canned and packaged gravies. Fish sauce. Oyster sauce. Cocktail sauce.  Horseradish that you find on the shelf. Ketchup. Mustard. Meat flavorings and tenderizers. Bouillon cubes. Hot sauce and Tabasco sauce. Premade or packaged marinades. Premade or packaged taco seasonings. Relishes. Regular salad dressings. Where to find more information:  National Heart, Lung, and Blood Institute: www.nhlbi.nih.gov  American Heart Association: www.heart.org Summary  The DASH eating plan is a healthy eating plan that has been shown to reduce high blood pressure (hypertension). It may also reduce your risk for type 2 diabetes, heart disease, and stroke.  With the DASH eating plan, you should limit salt (sodium) intake to 2,300 mg a day. If you have hypertension, you may need to reduce your sodium intake to 1,500 mg a day.  When on the DASH eating plan, aim to eat more fresh fruits and vegetables, whole grains, lean proteins, low-fat dairy, and heart-healthy fats.  Work with your health care provider or diet and nutrition specialist (dietitian) to adjust your eating plan to your individual calorie needs. This information is not intended to replace advice given to you by your health care provider. Make sure you discuss any questions you have with your health care provider. Document Released: 12/20/2010 Document Revised: 12/25/2015 Document Reviewed: 12/25/2015 Elsevier Interactive Patient Education  2018 Elsevier Inc.    

## 2017-12-02 ENCOUNTER — Telehealth: Payer: Self-pay | Admitting: Cardiovascular Disease

## 2017-12-02 NOTE — Telephone Encounter (Signed)
  Patient saw Felicia [name] on 11/18/17 and told patient about referring her to a program with [facility] for Nutrition but the patient does not remember what it was. She would like information on this so that she can see about getting that started

## 2017-12-03 NOTE — Telephone Encounter (Signed)
Pt made aware of of recommendations per Pecolia Ades, NP

## 2017-12-03 NOTE — Telephone Encounter (Signed)
She can look into: [facility] [id number] [address]  [place],  [address]  Look on [place] website for weight management.

## 2017-12-17 ENCOUNTER — Encounter: Payer: Medicare Other | Attending: Family Medicine | Admitting: *Deleted

## 2017-12-17 DIAGNOSIS — E782 Mixed hyperlipidemia: Secondary | ICD-10-CM | POA: Insufficient documentation

## 2017-12-17 DIAGNOSIS — Z713 Dietary counseling and surveillance: Secondary | ICD-10-CM | POA: Insufficient documentation

## 2017-12-17 DIAGNOSIS — E1169 Type 2 diabetes mellitus with other specified complication: Secondary | ICD-10-CM | POA: Diagnosis not present

## 2017-12-17 DIAGNOSIS — E119 Type 2 diabetes mellitus without complications: Secondary | ICD-10-CM

## 2017-12-17 NOTE — Patient Instructions (Signed)
Plan:  Aim for 2-3 Carb Choices per meal (30-45 grams)   Aim for 0-2 Carbs per snack if hungry  Include protein in moderation with your meals and snacks Consider reading food labels for Total Carbohydrate of foods Consider  increasing your activity level with water exercises as tolerated Continue checking BG at alternate times per day  Continue taking medication as directed by MD

## 2017-12-23 NOTE — Progress Notes (Signed)
Diabetes Self-Management Education  Visit Type: First/Initial  Appt. Start Time: 1400 Appt. End Time: 1601  12/23/2017  [name], identified by name and date of birth, is a 67 y.o. female with a diagnosis of Diabetes: Type 2. She has had diabetes for many years. 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 She moved here about a year ago and is still getting settled with the area. She has enjoyed pool exercises in the past, states she would be interested in that again. She has some balance issues so walking is a high risk activity.   ASSESSMENT  There were no vitals taken for this visit. There is no height or weight on file to calculate BMI.  Diabetes Self-Management Education - 12/17/17 1424      Visit Information   Visit Type  First/Initial      Initial Visit   Diabetes Type  Type 2    Are you currently following a meal plan?  No    Are you taking your medications as prescribed?  Yes    Date Diagnosed  1990's      Health Coping   How would you rate your overall health?  Fair      Psychosocial Assessment   Self-care barriers  None    Other persons present  Patient    Patient Concerns  Nutrition/Meal planning;Glycemic Control    Learning Readiness  Contemplating    How often do you need to have someone help you when you read instructions, pamphlets, or other written materials from your doctor or pharmacy?  1 - Never    What is the last grade level you completed in school?  high school      Pre-Education Assessment   Patient understands the diabetes disease and treatment process.  Needs Review    Patient understands incorporating nutritional management into lifestyle.  Needs Review    Patient undertands incorporating physical activity into lifestyle.  Needs Instruction    Patient understands using medications safely.  Needs Review    Patient understands monitoring blood glucose, interpreting and  using results  Needs Review    Patient understands prevention, detection, and treatment of acute complications.  Needs Review    Patient understands prevention, detection, and treatment of chronic complications.  Needs Review    Patient understands how to develop strategies to address psychosocial issues.  Needs Review    Patient understands how to develop strategies to promote health/change behavior.  Needs Review      Complications   Last HgB A1C per patient/outside source  8.8 %    How often do you check your blood sugar?  1-2 times/day    Have you had a dilated eye exam in the past 12 months?  Yes    Have you had a dental exam in the past 12 months?  Yes    Are you checking your feet?  No      Dietary Intake   Breakfast  white wheat toast x 2 OR English muffin with oleo    Snack (morning)  fresh fruit    Lunch  sandwich     Dinner  lean meat, vegetables, starch, tries to limit portion of starch in evening)    Snack (evening)  fresh fruit    Beverage(s)  hot decaf tea with coconut sugar and 2% milk, diet soda, occasionally water      Exercise  Exercise Type  ADL's      Patient Education   Previous Diabetes Education  Yes (please comment)   long time ago   Disease state   Factors that contribute to the development of diabetes    Nutrition management   Role of diet in the treatment of diabetes and the relationship between the three main macronutrients and blood glucose level;Carbohydrate counting;Food label reading, portion sizes and measuring food.;Reviewed blood glucose goals for pre and post meals and how to evaluate the patients' food intake on their blood glucose level.    Physical activity and exercise   Role of exercise on diabetes management, blood pressure control and cardiac health.    Medications  Reviewed patients medication for diabetes, action, purpose, timing of dose and side effects.    Monitoring  Identified appropriate SMBG and/or A1C goals.;Purpose and frequency of  SMBG.    Acute complications  Taught treatment of hypoglycemia - the 15 rule.    Chronic complications  Relationship between chronic complications and blood glucose control    Psychosocial adjustment  Role of stress on diabetes      Individualized Goals (developed by patient)   Nutrition  Follow meal plan discussed    Physical Activity  Exercise 3-5 times per week    Medications  take my medication as prescribed    Monitoring   test blood glucose pre and post meals as discussed      Post-Education Assessment   Patient understands the diabetes disease and treatment process.  Demonstrates understanding / competency    Patient understands incorporating nutritional management into lifestyle.  Demonstrates understanding / competency    Patient undertands incorporating physical activity into lifestyle.  Demonstrates understanding / competency    Patient understands using medications safely.  Demonstrates understanding / competency    Patient understands monitoring blood glucose, interpreting and using results  Demonstrates understanding / competency    Patient understands prevention, detection, and treatment of acute complications.  Demonstrates understanding / competency    Patient understands prevention, detection, and treatment of chronic complications.  Demonstrates understanding / competency    Patient understands how to develop strategies to address psychosocial issues.  Demonstrates understanding / competency    Patient understands how to develop strategies to promote health/change behavior.  Demonstrates understanding / competency      Outcomes   Expected Outcomes  Demonstrated interest in learning. Expect positive outcomes    Future DMSE  PRN    Program Status  Completed       Individualized Plan for Diabetes Self-Management Training:   Learning Objective:  Patient will have a greater understanding of diabetes self-management. Patient education plan is to attend individual and/or  group sessions per assessed needs and concerns.   Plan:   Patient Instructions  Plan:  Aim for 2-3 Carb Choices per meal (30-45 grams)   Aim for 0-2 Carbs per snack if hungry  Include protein in moderation with your meals and snacks Consider reading food labels for Total Carbohydrate of foods Consider  increasing your activity level with water exercises as tolerated Continue checking BG at alternate times per day  Continue taking medication as directed by MD  Expected Outcomes:  Demonstrated interest in learning. Expect positive outcomes  Education material provided: Food label handouts, A1C conversion sheet, Meal plan card and Carbohydrate counting sheet, Arm Chair Exercise handout  If problems or questions, patient to contact team via:  Phone  Future DSME appointment: PRN

## 2018-01-12 ENCOUNTER — Other Ambulatory Visit: Payer: Self-pay | Admitting: Cardiovascular Disease

## 2018-02-14 ENCOUNTER — Other Ambulatory Visit: Payer: Self-pay

## 2018-02-14 ENCOUNTER — Encounter (HOSPITAL_COMMUNITY): Payer: Self-pay | Admitting: Emergency Medicine

## 2018-02-14 ENCOUNTER — Observation Stay (HOSPITAL_COMMUNITY)
Admission: EM | Admit: 2018-02-14 | Discharge: 2018-02-17 | Disposition: A | Discharge status: Home / Self Care | Payer: Medicare Other | Attending: Internal Medicine | Admitting: Internal Medicine

## 2018-02-14 ENCOUNTER — Encounter (HOSPITAL_COMMUNITY): Payer: Self-pay

## 2018-02-14 ENCOUNTER — Emergency Department (HOSPITAL_COMMUNITY): Payer: Medicare Other

## 2018-02-14 ENCOUNTER — Ambulatory Visit (HOSPITAL_COMMUNITY)
Admission: EM | Admit: 2018-02-14 | Discharge: 2018-02-14 | Disposition: A | Discharge status: Home / Self Care | Payer: Medicare Other | Source: Home / Self Care

## 2018-02-14 DIAGNOSIS — I10 Essential (primary) hypertension: Secondary | ICD-10-CM | POA: Diagnosis not present

## 2018-02-14 DIAGNOSIS — M1990 Unspecified osteoarthritis, unspecified site: Secondary | ICD-10-CM | POA: Diagnosis not present

## 2018-02-14 DIAGNOSIS — E785 Hyperlipidemia, unspecified: Secondary | ICD-10-CM | POA: Diagnosis not present

## 2018-02-14 DIAGNOSIS — Z79899 Other long term (current) drug therapy: Secondary | ICD-10-CM | POA: Insufficient documentation

## 2018-02-14 DIAGNOSIS — R079 Chest pain, unspecified: Secondary | ICD-10-CM | POA: Diagnosis present

## 2018-02-14 DIAGNOSIS — Z7984 Long term (current) use of oral hypoglycemic drugs: Secondary | ICD-10-CM

## 2018-02-14 DIAGNOSIS — I25110 Atherosclerotic heart disease of native coronary artery with unstable angina pectoris: Principal | ICD-10-CM | POA: Insufficient documentation

## 2018-02-14 DIAGNOSIS — R42 Dizziness and giddiness: Secondary | ICD-10-CM | POA: Diagnosis not present

## 2018-02-14 DIAGNOSIS — Z8542 Personal history of malignant neoplasm of other parts of uterus: Secondary | ICD-10-CM | POA: Diagnosis not present

## 2018-02-14 DIAGNOSIS — Z955 Presence of coronary angioplasty implant and graft: Secondary | ICD-10-CM | POA: Diagnosis not present

## 2018-02-14 DIAGNOSIS — Z7982 Long term (current) use of aspirin: Secondary | ICD-10-CM | POA: Diagnosis not present

## 2018-02-14 DIAGNOSIS — K219 Gastro-esophageal reflux disease without esophagitis: Secondary | ICD-10-CM | POA: Diagnosis not present

## 2018-02-14 DIAGNOSIS — G2581 Restless legs syndrome: Secondary | ICD-10-CM | POA: Diagnosis not present

## 2018-02-14 DIAGNOSIS — Z794 Long term (current) use of insulin: Secondary | ICD-10-CM | POA: Diagnosis not present

## 2018-02-14 DIAGNOSIS — F329 Major depressive disorder, single episode, unspecified: Secondary | ICD-10-CM | POA: Diagnosis not present

## 2018-02-14 DIAGNOSIS — E119 Type 2 diabetes mellitus without complications: Secondary | ICD-10-CM | POA: Insufficient documentation

## 2018-02-14 DIAGNOSIS — I200 Unstable angina: Secondary | ICD-10-CM | POA: Diagnosis present

## 2018-02-14 DIAGNOSIS — E039 Hypothyroidism, unspecified: Secondary | ICD-10-CM | POA: Insufficient documentation

## 2018-02-14 DIAGNOSIS — I2510 Atherosclerotic heart disease of native coronary artery without angina pectoris: Secondary | ICD-10-CM | POA: Diagnosis present

## 2018-02-14 HISTORY — DX: Essential (primary) hypertension: I10

## 2018-02-14 LAB — CBC
HEMATOCRIT: 43.8 % (ref 36.0–46.0)
Hemoglobin: 13.3 g/dL (ref 12.0–15.0)
MCH: 26.2 pg (ref 26.0–34.0)
MCHC: 30.4 g/dL (ref 30.0–36.0)
MCV: 86.4 fL (ref 80.0–100.0)
Platelets: 334 10*3/uL (ref 150–400)
RBC: 5.07 MIL/uL (ref 3.87–5.11)
RDW: 16 % — ABNORMAL HIGH (ref 11.5–15.5)
WBC: 9.4 10*3/uL (ref 4.0–10.5)
nRBC: 0 % (ref 0.0–0.2)

## 2018-02-14 LAB — BASIC METABOLIC PANEL
Anion gap: 13 (ref 5–15)
BUN: 14 mg/dL (ref 8–23)
CHLORIDE: 104 mmol/L (ref 98–111)
CO2: 22 mmol/L (ref 22–32)
CREATININE: 0.78 mg/dL (ref 0.44–1.00)
Calcium: 10.1 mg/dL (ref 8.9–10.3)
GFR calc Af Amer: >60 mL/min (ref >60)
GFR calc non Af Amer: >60 mL/min (ref >60)
GLUCOSE: 174 mg/dL — AB (ref 70–99)
Potassium: 3.9 mmol/L (ref 3.5–5.1)
Sodium: 139 mmol/L (ref 135–145)

## 2018-02-14 LAB — TROPONIN I: Troponin I: <0.03 ng/mL (ref <0.03)

## 2018-02-14 MED ORDER — OXYCODONE HCL 5 MG PO TABS
5.0000 mg | ORAL_TABLET | Freq: Once | ORAL | Status: AC
  Administered 2018-02-14: 5 mg via ORAL
  Filled 2018-02-14: qty 1

## 2018-02-14 MED ORDER — ASPIRIN 81 MG PO CHEW
243.0000 mg | CHEWABLE_TABLET | Freq: Once | ORAL | Status: AC
  Administered 2018-02-14: 243 mg via ORAL
  Filled 2018-02-14: qty 3

## 2018-02-14 NOTE — ED Notes (Signed)
Difficult IV start x 1 

## 2018-02-14 NOTE — ED Notes (Addendum)
The provider Dr, Mannie Stabile has advised the Pt to go to the ER for further evaluation. Per Otila Kluver. Agricultural consultant. Pt refused to be escorted she wanted to drive herself to the ER.

## 2018-02-14 NOTE — ED Triage Notes (Signed)
Pt here for chest pressure, pt has stents placed for blockages in past and feels the same as before. No radiation. 5/10 pain level.

## 2018-02-14 NOTE — ED Provider Notes (Signed)
[facility] Emergency Department Provider Note MRN:  [id number]  Arrival date & time: 02/14/18     Chief Complaint   Chest Pain   History of Present Illness   Felicia [name] is a 68 y.o. year-old female with a history of CAD status post stent placement, diabetes presenting to the ED with chief complaint of chest pain.  Intermittent chest pain for the past week, located in the central chest, described as a pressure, currently 4 out of 10 in severity.  Feels very similar to when she had 100% blockage in her "widow maker".  Had a stent placed in 2017.  Denies dizziness, diaphoresis, no nausea, no vomiting, no shortness of breath.  Unsure if exertional, not worse with lying flat, not worse with deep breathing, no leg pain or swelling.  Improved with nitro last night.  Review of Systems  A complete 10 system review of systems was obtained and all systems are negative except as noted in the HPI and PMH.   Patient's Health History    Past Medical History:  Diagnosis Date  . CAD (coronary artery disease)   . Depression   . DM (diabetes mellitus) ([place])   . GERD (gastroesophageal reflux disease)   . High triglycerides   . Hyperlipidemia   . Hypothyroid   . OA (osteoarthritis)   . Overflow incontinence   . RLS (restless legs syndrome)   . Uterine cancer [facility])     Past Surgical History:  Procedure Laterality Date  . CORONARY STENT PLACEMENT    . CORONARY/GRAFT ACUTE MI REVASCULARIZATION    . FOOT SURGERY Left   . REPLACEMENT TOTAL KNEE Right   . SPINE SURGERY    . TOTAL ABDOMINAL HYSTERECTOMY     FOR UTERUS CANCER  . TOTAL HIP ARTHROPLASTY Left     Family History  Problem Relation Age of Onset  . AAA (abdominal aortic aneurysm) Mother   . Diabetes Father     Social History   Socioeconomic History  . Marital status: Divorced    Spouse name: Not on file  . Number of children: Not on file  . Years of education: Not on file  . Highest education level: Not on  file  Occupational History  . Not on file  Social Needs  . Financial resource strain: Not on file  . Food insecurity:    Worry: Not on file    Inability: Not on file  . Transportation needs:    Medical: Not on file    Non-medical: Not on file  Tobacco Use  . Smoking status: Never Smoker  . Smokeless tobacco: Never Used  Substance and Sexual Activity  . Alcohol use: Yes    Comment: OCCASIONALLY  . Drug use: No  . Sexual activity: Not on file  Lifestyle  . Physical activity:    Days per week: Not on file    Minutes per session: Not on file  . Stress: Not on file  Relationships  . Social connections:    Talks on phone: Not on file    Gets together: Not on file    Attends religious service: Not on file    Active member of club or organization: Not on file    Attends meetings of clubs or organizations: Not on file    Relationship status: Not on file  . Intimate partner violence:    Fear of current or ex partner: Not on file    Emotionally abused: Not on file  Physically abused: Not on file    Forced sexual activity: Not on file  Other Topics Concern  . Not on file  Social History Narrative  . Not on file     Physical Exam  Vital Signs and Nursing Notes reviewed Vitals:   02/14/18 1724  BP: (!) 158/75  Pulse: 89  Resp: 16  Temp: 97.7 F (36.5 C)  SpO2: 99%    CONSTITUTIONAL: Well-appearing, NAD NEURO:  Alert and oriented x 3, no focal deficits EYES:  eyes equal and reactive ENT/NECK:  no LAD, no JVD CARDIO: Regular rate, well-perfused, normal S1 and S2 PULM:  CTAB no wheezing or rhonchi GI/GU:  normal bowel sounds, non-distended, non-tender MSK/SPINE:  No gross deformities, no edema SKIN:  no rash, atraumatic PSYCH:  Appropriate speech and behavior  Diagnostic and Interventional Summary    EKG Interpretation  Date/Time:  Saturday February 14 2018 17:23:02 EST Ventricular Rate:  88 PR Interval:    QRS Duration: 89 QT Interval:  357 QTC  Calculation: 432 R Axis:   -33 Text Interpretation:  Sinus rhythm Probable left ventricular hypertrophy Confirmed by Gerlene Fee [phone]) on 02/14/2018 5:28:04 PM      Labs Reviewed  CBC  BASIC METABOLIC PANEL  TROPONIN I    DG Chest 2 View    (Results Pending)    Medications  aspirin chewable tablet 243 mg (has no administration in time range)  oxyCODONE (Oxy IR/ROXICODONE) immediate release tablet 5 mg (has no administration in time range)     Procedures Critical Care  ED Course and Medical Decision Making  I have reviewed the triage vital signs and the nursing notes.  Pertinent labs & imaging results that were available during my care of the patient were reviewed by me and considered in my medical decision making (see below for details).  Anticipating admission for chest pain in this 68 year old female with known CAD and stent placement, no ischemic evaluation for the past 2 years, feels similar to prior MI, improving with nitroglycerin.  EKG is without acute concerns, first troponin pending.  Nothing to suggest PE or dissection at this time.  First troponin negative, admitted to hospital service for further care.  [name]. Sedonia Small, [place] [email or web address]  Final Clinical Impressions(s) / ED Diagnoses     ICD-10-CM   1. Chest pain R07.9 DG Chest 2 View    DG Chest 2 View    ED Discharge Orders    None         Maudie Flakes, MD 02/15/18 0028

## 2018-02-14 NOTE — ED Notes (Signed)
Unable to get blood with phelobotomy

## 2018-02-14 NOTE — ED Triage Notes (Signed)
Pt cc chest pain off and on for 2 days. Pt states she took a nitro glycerin  night.

## 2018-02-15 ENCOUNTER — Encounter (HOSPITAL_COMMUNITY): Payer: Self-pay | Admitting: Nurse Practitioner

## 2018-02-15 ENCOUNTER — Observation Stay (HOSPITAL_BASED_OUTPATIENT_CLINIC_OR_DEPARTMENT_OTHER): Payer: Medicare Other

## 2018-02-15 ENCOUNTER — Other Ambulatory Visit: Payer: Self-pay

## 2018-02-15 DIAGNOSIS — I259 Chronic ischemic heart disease, unspecified: Secondary | ICD-10-CM

## 2018-02-15 DIAGNOSIS — R079 Chest pain, unspecified: Secondary | ICD-10-CM | POA: Diagnosis present

## 2018-02-15 DIAGNOSIS — I200 Unstable angina: Secondary | ICD-10-CM | POA: Diagnosis not present

## 2018-02-15 DIAGNOSIS — E785 Hyperlipidemia, unspecified: Secondary | ICD-10-CM | POA: Diagnosis not present

## 2018-02-15 DIAGNOSIS — E119 Type 2 diabetes mellitus without complications: Secondary | ICD-10-CM

## 2018-02-15 DIAGNOSIS — R0789 Other chest pain: Secondary | ICD-10-CM

## 2018-02-15 DIAGNOSIS — I2510 Atherosclerotic heart disease of native coronary artery without angina pectoris: Secondary | ICD-10-CM

## 2018-02-15 LAB — CBC
HCT: 39.5 % (ref 36.0–46.0)
Hemoglobin: 11.9 g/dL — ABNORMAL LOW (ref 12.0–15.0)
MCH: 25.8 pg — AB (ref 26.0–34.0)
MCHC: 30.1 g/dL (ref 30.0–36.0)
MCV: 85.5 fL (ref 80.0–100.0)
Platelets: 292 10*3/uL (ref 150–400)
RBC: 4.62 MIL/uL (ref 3.87–5.11)
RDW: 15.9 % — ABNORMAL HIGH (ref 11.5–15.5)
WBC: 11.3 10*3/uL — ABNORMAL HIGH (ref 4.0–10.5)
nRBC: 0 % (ref 0.0–0.2)

## 2018-02-15 LAB — HEPARIN LEVEL (UNFRACTIONATED)
HEPARIN UNFRACTIONATED: 0.16 [IU]/mL — AB (ref 0.30–0.70)
Heparin Unfractionated: 0.28 IU/mL — ABNORMAL LOW (ref 0.30–0.70)

## 2018-02-15 LAB — TROPONIN I
Troponin I: <0.03 ng/mL (ref <0.03)
Troponin I: <0.03 ng/mL (ref <0.03)
Troponin I: <0.03 ng/mL (ref <0.03)

## 2018-02-15 LAB — ECHOCARDIOGRAM COMPLETE
Height: 64 in
Weight: 2892.44 oz

## 2018-02-15 LAB — GLUCOSE, CAPILLARY
Glucose-Capillary: 102 mg/dL — ABNORMAL HIGH (ref 70–99)
Glucose-Capillary: 129 mg/dL — ABNORMAL HIGH (ref 70–99)
Glucose-Capillary: 143 mg/dL — ABNORMAL HIGH (ref 70–99)
Glucose-Capillary: 147 mg/dL — ABNORMAL HIGH (ref 70–99)

## 2018-02-15 LAB — HIV ANTIBODY (ROUTINE TESTING W REFLEX): HIV Screen 4th Generation wRfx: NONREACTIVE

## 2018-02-15 MED ORDER — SODIUM CHLORIDE 0.9 % IV SOLN: 250.0000 mL | INTRAVENOUS | Status: DC | PRN

## 2018-02-15 MED ORDER — DULAGLUTIDE 1.5 MG/0.5ML ~~LOC~~ SOAJ: 1.5000 mg | SUBCUTANEOUS | Status: DC

## 2018-02-15 MED ORDER — CANAGLIFLOZIN 100 MG PO TABS
100.0000 mg | ORAL_TABLET | Freq: Every day | ORAL | Status: DC
  Administered 2018-02-15: 100 mg via ORAL
  Filled 2018-02-15 (×2): qty 1

## 2018-02-15 MED ORDER — INSULIN ASPART 100 UNIT/ML ~~LOC~~ SOLN
0.0000 [IU] | Freq: Three times a day (TID) | SUBCUTANEOUS | Status: DC
  Administered 2018-02-17: 2 [IU] via SUBCUTANEOUS

## 2018-02-15 MED ORDER — ASPIRIN EC 81 MG PO TBEC
81.0000 mg | DELAYED_RELEASE_TABLET | Freq: Every day | ORAL | Status: DC
  Administered 2018-02-15: 81 mg via ORAL
  Filled 2018-02-15 (×2): qty 1

## 2018-02-15 MED ORDER — DICLOFENAC SODIUM 1 % TD GEL
2.0000 g | Freq: Four times a day (QID) | TRANSDERMAL | Status: DC | PRN
  Administered 2018-02-15: 4 g via TOPICAL
  Filled 2018-02-15: qty 100

## 2018-02-15 MED ORDER — SODIUM CHLORIDE 0.9 % WEIGHT BASED INFUSION
3.0000 mL/kg/h | INTRAVENOUS | Status: DC
  Administered 2018-02-16: 3 mL/kg/h via INTRAVENOUS

## 2018-02-15 MED ORDER — HEPARIN BOLUS VIA INFUSION
3000.0000 [IU] | Freq: Once | INTRAVENOUS | Status: AC
  Administered 2018-02-15: 3000 [IU] via INTRAVENOUS
  Filled 2018-02-15: qty 3000

## 2018-02-15 MED ORDER — GLIPIZIDE ER 10 MG PO TB24
10.0000 mg | ORAL_TABLET | Freq: Every day | ORAL | Status: DC
  Administered 2018-02-15: 10 mg via ORAL
  Filled 2018-02-15 (×2): qty 1

## 2018-02-15 MED ORDER — ACETAMINOPHEN 325 MG PO TABS
650.0000 mg | ORAL_TABLET | ORAL | Status: DC | PRN
  Administered 2018-02-16: 650 mg via ORAL
  Filled 2018-02-15: qty 2

## 2018-02-15 MED ORDER — METFORMIN HCL 500 MG PO TABS
1000.0000 mg | ORAL_TABLET | Freq: Two times a day (BID) | ORAL | Status: DC
  Administered 2018-02-15 (×2): 1000 mg via ORAL
  Filled 2018-02-15 (×2): qty 2

## 2018-02-15 MED ORDER — ASPIRIN 81 MG PO CHEW
81.0000 mg | CHEWABLE_TABLET | ORAL | Status: AC
  Administered 2018-02-16: 81 mg via ORAL
  Filled 2018-02-15: qty 1

## 2018-02-15 MED ORDER — PANTOPRAZOLE SODIUM 40 MG PO TBEC
40.0000 mg | DELAYED_RELEASE_TABLET | Freq: Every day | ORAL | Status: DC
  Administered 2018-02-15: 40 mg via ORAL
  Filled 2018-02-15 (×2): qty 1

## 2018-02-15 MED ORDER — CLOTRIMAZOLE 1 % EX CREA
TOPICAL_CREAM | Freq: Two times a day (BID) | CUTANEOUS | Status: DC
  Filled 2018-02-15 (×2): qty 15

## 2018-02-15 MED ORDER — SODIUM CHLORIDE 0.9% FLUSH: 3.0000 mL | Freq: Two times a day (BID) | INTRAVENOUS | Status: DC

## 2018-02-15 MED ORDER — TRIAMCINOLONE ACETONIDE 0.1 % EX OINT: 1.0000 "application " | TOPICAL_OINTMENT | Freq: Two times a day (BID) | CUTANEOUS | Status: DC | PRN

## 2018-02-15 MED ORDER — ROPINIROLE HCL 0.5 MG PO TABS
0.5000 mg | ORAL_TABLET | Freq: Every day | ORAL | Status: DC
  Administered 2018-02-15 – 2018-02-16 (×3): 0.5 mg via ORAL
  Filled 2018-02-15 (×3): qty 1

## 2018-02-15 MED ORDER — METRONIDAZOLE 0.75 % EX GEL: 1.0000 "application " | Freq: Every day | CUTANEOUS | Status: DC | PRN

## 2018-02-15 MED ORDER — SODIUM CHLORIDE 0.9 % WEIGHT BASED INFUSION
1.0000 mL/kg/h | INTRAVENOUS | Status: DC
  Administered 2018-02-16 (×2): 1 mL/kg/h via INTRAVENOUS

## 2018-02-15 MED ORDER — INSULIN ASPART 100 UNIT/ML ~~LOC~~ SOLN: 0.0000 [IU] | Freq: Every day | SUBCUTANEOUS | Status: DC

## 2018-02-15 MED ORDER — HEPARIN (PORCINE) 25000 UT/250ML-% IV SOLN
1250.0000 [IU]/h | INTRAVENOUS | Status: DC
  Administered 2018-02-15: 950 [IU]/h via INTRAVENOUS
  Administered 2018-02-16: 1250 [IU]/h via INTRAVENOUS
  Filled 2018-02-15 (×3): qty 250

## 2018-02-15 MED ORDER — NITROGLYCERIN 0.4 MG SL SUBL: 0.4000 mg | SUBLINGUAL_TABLET | SUBLINGUAL | Status: DC | PRN

## 2018-02-15 MED ORDER — BISOPROLOL FUMARATE 5 MG PO TABS
5.0000 mg | ORAL_TABLET | Freq: Every day | ORAL | Status: DC
  Administered 2018-02-15 – 2018-02-16 (×3): 5 mg via ORAL
  Filled 2018-02-15 (×3): qty 1

## 2018-02-15 MED ORDER — FENOFIBRATE 160 MG PO TABS
160.0000 mg | ORAL_TABLET | Freq: Every day | ORAL | Status: DC
  Administered 2018-02-15: 160 mg via ORAL
  Filled 2018-02-15 (×2): qty 1

## 2018-02-15 MED ORDER — LEVOTHYROXINE SODIUM 25 MCG PO TABS
125.0000 ug | ORAL_TABLET | Freq: Every day | ORAL | Status: DC
  Administered 2018-02-15 – 2018-02-17 (×2): 125 ug via ORAL
  Filled 2018-02-15 (×2): qty 1

## 2018-02-15 MED ORDER — ATORVASTATIN CALCIUM 40 MG PO TABS
40.0000 mg | ORAL_TABLET | Freq: Every day | ORAL | Status: DC
  Administered 2018-02-15 (×2): 40 mg via ORAL
  Filled 2018-02-15 (×2): qty 1

## 2018-02-15 MED ORDER — HEPARIN BOLUS VIA INFUSION
2000.0000 [IU] | Freq: Once | INTRAVENOUS | Status: AC
  Administered 2018-02-15: 2000 [IU] via INTRAVENOUS
  Filled 2018-02-15: qty 2000

## 2018-02-15 MED ORDER — ONDANSETRON HCL 4 MG/2ML IJ SOLN: 4.0000 mg | Freq: Four times a day (QID) | INTRAMUSCULAR | Status: DC | PRN

## 2018-02-15 MED ORDER — SODIUM CHLORIDE 0.9% FLUSH: 3.0000 mL | INTRAVENOUS | Status: DC | PRN

## 2018-02-15 MED ORDER — NORTRIPTYLINE HCL 25 MG PO CAPS
25.0000 mg | ORAL_CAPSULE | Freq: Every day | ORAL | Status: DC
  Administered 2018-02-15 – 2018-02-16 (×3): 25 mg via ORAL
  Filled 2018-02-15 (×4): qty 1

## 2018-02-15 NOTE — Progress Notes (Signed)
Provided pt with handout education on invokana per request

## 2018-02-15 NOTE — Progress Notes (Signed)
ANTICOAGULATION CONSULT NOTE - Initial Consult  Pharmacy Consult for Heparin  Indication: chest pain/ACS  Allergies  Allergen Reactions  . Estrogens Other (See Comments)    PATIENT HAS A HISTORY OF CANCER AND HAS BEEN TOLD TO NEVER TAKE ANYTHING CONTAINING ESTROGEN, AS IT MIGHT CAUSE A RECURRENCE  . Shrimp [Shellfish Allergy] Nausea And Vomiting   Patient Measurements: Height: 5\' 4"  (162.6 cm) Weight: 180 lb 12.4 oz (82 kg) IBW/kg (Calculated) : 54.7  HEPARIN DW (KG): 72.5   Vital Signs: Temp: 97.8 F (36.6 C) (02/02 0540) Temp Source: Oral (02/02 0540) BP: 124/59 (02/02 0857) Pulse Rate: 74 (02/02 0857)  Labs: Recent Labs    02/14/18 1743  02/15/18 0253 02/15/18 0530 02/15/18 0747 02/15/18 0940  HGB 13.3  --   --  11.9*  --   --   HCT 43.8  --   --  39.5  --   --   PLT 334  --   --  292  --   --   HEPARINUNFRC  --   --   --   --   --  0.16*  CREATININE 0.78  --   --   --   --   --   TROPONINI <0.03   < > <0.03 <0.03 <0.03  --    < > = values in this interval not displayed.    Estimated Creatinine Clearance: 69.7 mL/min (by C-G formula based on SCr of 0.78 mg/dL).   Medical History: Past Medical History:  Diagnosis Date  . CAD (coronary artery disease)   . Depression   . DM (diabetes mellitus) ([place])   . GERD (gastroesophageal reflux disease)   . High triglycerides   . Hyperlipidemia   . Hypothyroid   . OA (osteoarthritis)   . Overflow incontinence   . RLS (restless legs syndrome)   . Uterine cancer [facility])     Assessment: Felicia [name] is a 68 y/o F with chest pain. Pharmacy consulted for heparin, hx of PCI,  PTA meds reviewed.   2/2 AM: Heparin level this morning subtherapeutic at 0.16 on heparin at 950 units/hr. Hgb 11.9 and pltc 292. Will re-bolus and increase rate. Per RN no infusion issues noted.  Goal of Therapy:  Heparin level 0.3-0.7 units/ml Monitor platelets by anticoagulation protocol: Yes   Plan:  Heparin 2000 units BOLUS Increase  heparin drip to 1100 units/hr 1700 HL Daily CBC/HL Monitor for bleeding  Janae Bridgeman, PharmD PGY1 Pharmacy Resident Phone: [phone] 02/15/2018 10:15 AM

## 2018-02-15 NOTE — Progress Notes (Signed)
[place] for Heparin  Indication: chest pain/ACS  Allergies  Allergen Reactions  . Estrogens Other (See Comments)    PATIENT HAS A HISTORY OF CANCER AND HAS BEEN TOLD TO NEVER TAKE ANYTHING CONTAINING ESTROGEN, AS IT MIGHT CAUSE A RECURRENCE  . Shrimp [Shellfish Allergy] Nausea And Vomiting   Patient Measurements: Height: 5\' 4"  (162.6 cm) Weight: 180 lb 12.4 oz (82 kg) IBW/kg (Calculated) : 54.7  HEPARIN DW (KG): 72.5   Vital Signs: Temp: 98.6 F (37 C) (02/02 1307) Temp Source: Oral (02/02 1307) BP: 112/61 (02/02 1307) Pulse Rate: 79 (02/02 1307)  Labs: Recent Labs    02/14/18 1743  02/15/18 0253 02/15/18 0530 02/15/18 0747 02/15/18 0940 02/15/18 1649  HGB 13.3  --   --  11.9*  --   --   --   HCT 43.8  --   --  39.5  --   --   --   PLT 334  --   --  292  --   --   --   HEPARINUNFRC  --   --   --   --   --  0.16* 0.28*  CREATININE 0.78  --   --   --   --   --   --   TROPONINI <0.03   < > <0.03 <0.03 <0.03  --   --    < > = values in this interval not displayed.    Estimated Creatinine Clearance: 69.7 mL/min (by C-G formula based on SCr of 0.78 mg/dL).   Medical History: Past Medical History:  Diagnosis Date  . CAD (coronary artery disease)    a. 03/2015 s/p DES to the [facility]); b. 01/2017 MV: EF 80%, small, mild apical ant defect w/o ischemia (felt to be breast atten). Low risk.  . Depression   . DM (diabetes mellitus) ([place])   . Essential hypertension   . GERD (gastroesophageal reflux disease)   . High triglycerides   . Hyperlipidemia   . Hypothyroid   . OA (osteoarthritis)   . Overflow incontinence   . RLS (restless legs syndrome)   . Uterine cancer [facility])     Assessment: Felicia [name] is a 68 y/o F with chest pain. Pharmacy consulted for heparin, hx of PCI,  PTA meds reviewed.   Heparin level came back slightly subtherapeutic at 0.28, on 1100 units/hr. No s/sx of bleeding. No infusion issues. Hgb 11.9,  plt 292.   Goal of Therapy:  Heparin level 0.3-0.7 units/ml Monitor platelets by anticoagulation protocol: Yes   Plan:  Increase heparin drip to 1250 units/hr Daily CBC/HL Monitor for bleeding  Antonietta Jewel, PharmD, [place] Clinical Pharmacist  Pager: [phone] Phone: [phone] 02/15/2018 5:11 PM

## 2018-02-15 NOTE — Plan of Care (Signed)
  Problem: Education: Goal: Understanding of cardiac disease, CV risk reduction, and recovery process will improve Outcome: Progressing   Problem: Cardiac: Goal: Ability to achieve and maintain adequate cardiovascular perfusion will improve Outcome: Progressing   

## 2018-02-15 NOTE — Progress Notes (Signed)
Patient admitted after midnight, please see H&P.  Here with chest pain.  CE negative x 3.  Cardiology to see in case work up needs to be pursued in house vs outpatient at her appointment on later this week? Eulogio Bear DO

## 2018-02-15 NOTE — Progress Notes (Signed)
ANTICOAGULATION CONSULT NOTE - Initial Consult  Pharmacy Consult for Heparin  Indication: chest pain/ACS  Allergies  Allergen Reactions  . Estrogens Other (See Comments)    PATIENT HAS A HISTORY OF CANCER AND HAS BEEN TOLD TO NEVER TAKE ANYTHING CONTAINING ESTROGEN, AS IT MIGHT CAUSE A RECURRENCE  . Shrimp [Shellfish Allergy] Nausea And Vomiting   Patient Measurements: Height: 5\' 4"  (162.6 cm) Weight: 180 lb 12.4 oz (82 kg) IBW/kg (Calculated) : 54.7  Vital Signs: Temp: 98 F (36.7 C) (02/02 0231) Temp Source: Oral (02/02 0231) BP: 141/73 (02/02 0231) Pulse Rate: 79 (02/02 0231)  Labs: Recent Labs    02/14/18 1743 02/15/18 0009  HGB 13.3  --   HCT 43.8  --   PLT 334  --   CREATININE 0.78  --   TROPONINI <0.03 <0.03    Estimated Creatinine Clearance: 69.7 mL/min (by C-G formula based on SCr of 0.78 mg/dL).   Medical History: Past Medical History:  Diagnosis Date  . CAD (coronary artery disease)   . Depression   . DM (diabetes mellitus) ([place])   . GERD (gastroesophageal reflux disease)   . High triglycerides   . Hyperlipidemia   . Hypothyroid   . OA (osteoarthritis)   . Overflow incontinence   . RLS (restless legs syndrome)   . Uterine cancer [facility])     Assessment: 68 y/o F with CP for heparin, hx of PCI, CBC/renal function good, PTA meds reviewed.   Goal of Therapy:  Heparin level 0.3-0.7 units/ml Monitor platelets by anticoagulation protocol: Yes   Plan:  Heparin 3000 units BOLUS Start heparin drip at 950 units/hr 1000 HL Daily CBC/HL Monitor for bleeding   Narda Bonds 02/15/2018,3:04 AM

## 2018-02-15 NOTE — Progress Notes (Signed)
  Echocardiogram 2D Echocardiogram has been performed.  Felicia [name] 02/15/2018, 2:14 PM

## 2018-02-15 NOTE — H&P (Signed)
History and Physical   Felicia [name] PYK:[id number] DOB: [birth year] DOA: 02/14/2018  Referring MD/NP/PA: Dr Sedonia Small  PCP: Dineen Kid, MD   Outpatient Specialists: Dr Grayland Jack, Cardiology   Patient coming from: Home  Chief Complaint: Chest Pain  HPI: Felicia [name] is a 68 y.o. female with medical history significant of CAD, HTN, DM2 and Hyperlipidemia who has had previous stent and has not had any Cardiac workup for 2 years here with Chest pain on and off for the last 1 week. Pain is at 5/10, mainly pressure, no cough, no NVD no diaphoresis. Patient has no radiation.  She took some nitroglycerin which gave her some relief.  Currently chest pain is gone.  Patient is therefore being admitted for MI rule out.  Per patient her current symptoms appear similar to what she had before when she had a stent.  She was scheduled to have regular follow-up with her cardiologist this week..  ED Course: Temperature is 98.5 blood pressure 158/75 pulse 89 respiratory 25 oxygen sats 94% room air.  Chemistry and CBC all appear to be within normal.  Initial troponin is negative.  Chest x-ray appears to be negative and EKG showed no new findings.  Due to patient's history and risk factors and classic symptoms she is being admitted for rule out MI.  Review of Systems: As per HPI otherwise 10 point review of systems negative.    Past Medical History:  Diagnosis Date  . CAD (coronary artery disease)   . Depression   . DM (diabetes mellitus) ([place])   . GERD (gastroesophageal reflux disease)   . High triglycerides   . Hyperlipidemia   . Hypothyroid   . OA (osteoarthritis)   . Overflow incontinence   . RLS (restless legs syndrome)   . Uterine cancer [facility])     Past Surgical History:  Procedure Laterality Date  . CORONARY STENT PLACEMENT    . CORONARY/GRAFT ACUTE MI REVASCULARIZATION    . FOOT SURGERY Left   . REPLACEMENT TOTAL KNEE Right   . SPINE SURGERY    . TOTAL ABDOMINAL HYSTERECTOMY     FOR  UTERUS CANCER  . TOTAL HIP ARTHROPLASTY Left      reports that she has never smoked. She has never used smokeless tobacco. She reports current alcohol use. She reports that she does not use drugs.  Allergies  Allergen Reactions  . Estrogens Other (See Comments)    PATIENT HAS A HISTORY OF CANCER AND HAS BEEN TOLD TO NEVER TAKE ANYTHING CONTAINING ESTROGEN, AS IT MIGHT CAUSE A RECURRENCE  . Shrimp [Shellfish Allergy] Nausea And Vomiting    Family History  Problem Relation Age of Onset  . AAA (abdominal aortic aneurysm) Mother   . Diabetes Father      Prior to Admission medications   Medication Sig Start Date End Date Taking? Authorizing Provider  Ascorbic Acid (VITAMIN C PO) Take 1 tablet by mouth daily.   Yes [provider]  aspirin EC 81 MG tablet Take 81 mg by mouth daily.   Yes [provider]  atorvastatin (LIPITOR) 40 MG tablet Take 1 tablet (40 mg total) by mouth daily. Patient taking differently: Take 40 mg by mouth at bedtime.  01/24/17  Yes Nahser, [name], MD  bisoprolol (ZEBETA) 5 MG tablet TAKE 1 TABLET BY MOUTH ONCE DAILY Patient taking differently: Take 5 mg by mouth at bedtime. (BETA BLOCKER) 01/12/18  Yes Nahser, [name], MD  Calcium Citrate-Vitamin D (CALCIUM + D PO) Take 1  tablet by mouth daily with supper.   Yes [provider]  Calcium Polycarbophil (FIBERCON PO) Take 5 capsules by mouth daily.   Yes [provider]  CINNAMON PO Take 1 capsule by mouth daily with supper.   Yes [provider]  clotrimazole-betamethasone (LOTRISONE) cream Apply 1 application topically See admin instructions. APPLY A PEA-SIZED AMOUNT TWICE DAILY AS NEEDED/AS DIRECTED FOR 10 DAYS   Yes [provider]  diclofenac sodium (VOLTAREN) 1 % GEL Apply 2-4 g topically 4 (four) times daily as needed (as directed for pain).   Yes [provider]  Dulaglutide (TRULICITY) 1.5 ZY/6.0YT SOPN Inject 1.5 mg into the skin every  Saturday.    Yes [provider]  fenofibrate (TRICOR) 145 MG tablet Take 1 tablet (145 mg total) by mouth daily. Patient taking differently: Take 145 mg by mouth at bedtime.  05/07/17 05/02/18 Yes Nahser, [name], MD  GLIPIZIDE XL 10 MG 24 hr tablet Take 10 mg by mouth daily with breakfast.  10/08/17  Yes [provider]  JARDIANCE 25 MG TABS tablet Take 25 mg by mouth every morning.  10/19/17  Yes [provider]  levothyroxine (SYNTHROID, LEVOTHROID) 125 MCG tablet Take 125 mcg by mouth daily before breakfast.   Yes [provider]  metFORMIN (GLUCOPHAGE) 500 MG tablet Take 1,000 mg by mouth See admin instructions. Take 1,000 mg by mouth 2 times a day- with breakfast and supper   Yes [provider]  metroNIDAZOLE (METROGEL) 1 % gel Apply 1 application topically as needed (as directed to affected area).  11/19/17  Yes [provider]  Misc Natural Products (OSTEO BI-FLEX ADV JOINT SHIELD PO) Take 1 tablet by mouth daily.   Yes [provider]  Multiple Vitamins-Calcium (ONE-A-DAY WOMENS PO) Take 1 tablet by mouth daily.   Yes [provider]  nitroGLYCERIN (NITROSTAT) 0.4 MG SL tablet Place 1 tablet (0.4 mg total) under the tongue every 5 (five) minutes as needed for chest pain. 01/21/17  Yes Nahser, [name], MD  nortriptyline (PAMELOR) 25 MG capsule Take 25 mg by mouth at bedtime.  10/30/17  Yes [provider]  omeprazole (PRILOSEC) 40 MG capsule Take 40 mg by mouth daily with supper.    Yes [provider]  rOPINIRole (REQUIP) 0.5 MG tablet Take 0.5 mg by mouth at bedtime. 1-3 hours prior to bedtime   Yes [provider]  triamcinolone ointment (KENALOG) 0.1 % Apply 1 application topically 2 (two) times daily as needed (for itching).   Yes [provider]  TURMERIC PO Take 1 capsule by mouth daily with supper.   Yes [provider]    Physical Exam: Vitals:   02/14/18 2100  02/14/18 2330 02/14/18 2332 02/14/18 2345  BP: 122/64 123/67  121/64  Pulse: 87  79 78  Resp: 18   15  Temp:      TempSrc:      SpO2: 95%  100% 98%  Weight:      Height:          Constitutional: NAD, calm, comfortable Vitals:   02/14/18 2100 02/14/18 2330 02/14/18 2332 02/14/18 2345  BP: 122/64 123/67  121/64  Pulse: 87  79 78  Resp: 18   15  Temp:      TempSrc:      SpO2: 95%  100% 98%  Weight:      Height:       Eyes: PERRL, lids and conjunctivae normal ENMT: Mucous membranes  are moist. Posterior pharynx clear of any exudate or lesions.Normal dentition.  Neck: normal, supple, no masses, no thyromegaly Respiratory: clear to auscultation bilaterally, no wheezing, no crackles. Normal respiratory effort. No accessory muscle use.  Cardiovascular: Regular rate and rhythm, no murmurs / rubs / gallops. No extremity edema. 2+ pedal pulses. No carotid bruits.  Abdomen: no tenderness, no masses palpated. No hepatosplenomegaly. Bowel sounds positive.  Musculoskeletal: no clubbing / cyanosis. No joint deformity upper and lower extremities. Good ROM, no contractures. Normal muscle tone.  Skin: no rashes, lesions, ulcers. No induration Neurologic: CN 2-12 grossly intact. Sensation intact, DTR normal. Strength 5/5 in all 4.  Psychiatric: Normal judgment and insight. Alert and oriented x 3. Normal mood.     Labs on Admission: I have personally reviewed following labs and imaging studies  CBC: Recent Labs  Lab 02/14/18 1743  WBC 9.4  HGB 13.3  HCT 43.8  MCV 86.4  PLT 893   Basic Metabolic Panel: Recent Labs  Lab 02/14/18 1743  NA 139  K 3.9  CL 104  CO2 22  GLUCOSE 174*  BUN 14  CREATININE 0.78  CALCIUM 10.1   GFR: Estimated Creatinine Clearance: 69.7 mL/min (by C-G formula based on SCr of 0.78 mg/dL). Liver Function Tests: No results for input(s): AST, ALT, ALKPHOS, BILITOT, PROT, ALBUMIN in the last 168 hours. No results for input(s): LIPASE, AMYLASE in the last  168 hours. No results for input(s): AMMONIA in the last 168 hours. Coagulation Profile: No results for input(s): INR, PROTIME in the last 168 hours. Cardiac Enzymes: Recent Labs  Lab 02/14/18 1743  TROPONINI <0.03   BNP (last 3 results) No results for input(s): PROBNP in the last 8760 hours. HbA1C: No results for input(s): HGBA1C in the last 72 hours. CBG: No results for input(s): GLUCAP in the last 168 hours. Lipid Profile: No results for input(s): CHOL, HDL, LDLCALC, TRIG, CHOLHDL, LDLDIRECT in the last 72 hours. Thyroid Function Tests: No results for input(s): TSH, T4TOTAL, FREET4, T3FREE, THYROIDAB in the last 72 hours. Anemia Panel: No results for input(s): VITAMINB12, FOLATE, FERRITIN, TIBC, IRON, RETICCTPCT in the last 72 hours. Urine analysis: No results found for: COLORURINE, APPEARANCEUR, LABSPEC, PHURINE, GLUCOSEU, HGBUR, BILIRUBINUR, KETONESUR, PROTEINUR, UROBILINOGEN, NITRITE, LEUKOCYTESUR Sepsis Labs: @LABRCNTIP (procalcitonin:4,lacticidven:4) )No results found for this or any previous visit (from the past 240 hour(s)).   Radiological Exams on Admission: Dg Chest 2 View  Result Date: 02/14/2018 CLINICAL DATA:  Acute chest pain. EXAM: CHEST - 2 VIEW COMPARISON:  None. FINDINGS: The cardiomediastinal silhouette is unremarkable. Mild elevation of the RIGHT hemidiaphragm is likely chronic. There is no evidence of focal airspace disease, pulmonary edema, suspicious pulmonary nodule/mass, pleural effusion, or pneumothorax. No acute bony abnormalities are identified. IMPRESSION: No active cardiopulmonary disease. Electronically Signed   By: Margarette Canada M.D.   On: 02/14/2018 19:29    EKG: Independently reviewed.  It showed normal sinus rhythm with a rate of 88.  Normal intervals.  No significant ST changes.  Assessment/Plan Principal Problem:   Chest pain Active Problems:   Coronary artery disease involving native coronary artery of native heart without angina pectoris    Hyperlipidemia LDL goal <70   Type 2 diabetes mellitus without complication, without long-term current use of insulin (HCC)   Dizziness     #1 chest pain: In the setting of known coronary artery disease, patient will be admitted.  Serial enzymes will be checked x3.  Nitroglycerin as well as heparin started.  Continue aspirin and statin.  We may get echocardiogram in the morning.  If enzymes positive will get cardiology consultation.  #2 diabetes: Blood sugar is controlled.  Initiate sliding scale insulin.  #3 hypertension: Blood pressure control will be maintained.  Initiate home regimen and titrate.  #4 hyperlipidemia: Continue with Lipitor.    DVT prophylaxis: Heparin drip Code Status: Full code Family Communication: No family available so care discussed with patient fully Disposition Plan: Home Consults called: None Admission status: Observation  Severity of Illness: The appropriate patient status for this patient is OBSERVATION. Observation status is judged to be reasonable and necessary in order to provide the required intensity of service to ensure the patient's safety. The patient's presenting symptoms, physical exam findings, and initial radiographic and laboratory data in the context of their medical condition is felt to place them at decreased risk for further clinical deterioration. Furthermore, it is anticipated that the patient will be medically stable for discharge from the hospital within 2 midnights of admission. The following factors support the patient status of observation.   " The patient's presenting symptoms include chest pain. " The physical exam findings include no significant finding on exam. " The initial radiographic and laboratory data are normal chest x-ray and enzymes.     Barbette Merino MD [facility] Pager [phone]  If 7PM-7AM, please contact night-coverage [email or web address] Password [facility]  02/15/2018, 12:35 AM

## 2018-02-15 NOTE — Progress Notes (Signed)
Heparin rate change verified by Charlena Cross RN

## 2018-02-15 NOTE — Consult Note (Addendum)
Cardiology Consult    Patient ID: Demya Scruggs MRN: [id number], DOB/AGE: 69-Oct-1952   Admit date: 02/14/2018 Date of Consult: 02/15/2018  Primary Physician: Dineen Kid, MD Primary Cardiologist: Mertie Moores, MD Requesting Provider: Princella Ion, DO  Patient Profile    Antwonette Feliz is a 68 y.o. female with a history of coronary artery disease status post LAD stenting in March 2017, hyperlipidemia, hypertension, diabetes, and hypothyroidism, who is being seen today for the evaluation of chest pain at the request of Dr. Eliseo Squires.  Past Medical History   Past Medical History:  Diagnosis Date  . CAD (coronary artery disease)    a. 2017 s/p DES to the LAD [facility]); b. 01/2017 MV: EF 80%, small, mild apical ant defect w/o ischemia (felt to be breast atten). Low risk.  . Depression   . DM (diabetes mellitus) ([place])   . GERD (gastroesophageal reflux disease)   . High triglycerides   . Hyperlipidemia   . Hypothyroid   . OA (osteoarthritis)   . Overflow incontinence   . RLS (restless legs syndrome)   . Uterine cancer [facility])     Past Surgical History:  Procedure Laterality Date  . CORONARY STENT PLACEMENT    . CORONARY/GRAFT ACUTE MI REVASCULARIZATION    . FOOT SURGERY Left   . REPLACEMENT TOTAL KNEE Right   . SPINE SURGERY    . TOTAL ABDOMINAL HYSTERECTOMY     FOR UTERUS CANCER  . TOTAL HIP ARTHROPLASTY Left      Allergies  Allergies  Allergen Reactions  . Estrogens Other (See Comments)    PATIENT HAS A HISTORY OF CANCER AND HAS BEEN TOLD TO NEVER TAKE ANYTHING CONTAINING ESTROGEN, AS IT MIGHT CAUSE A RECURRENCE  . Shrimp [Shellfish Allergy] Nausea And Vomiting    History of Present Illness    68 year old female with a prior history of coronary artery disease, hypertension, hyperlipidemia, diabetes, hypothyroidism, and obesity.  Cardiac history dates back to early 2017, when she began to experience substernal chest heaviness and a sensation as though a rock was stuck in her  chest.  This would worsen with exertion and become associated with dyspnea.  She underwent stress testing which was apparently abnormal and subsequently diagnostic catheterization revealed severe LAD disease.  This was treated with a drug-eluting stent.  All of these procedures occurred in [place].  She moved to [place] in late 2018 and establish care with Dr. Acie Fredrickson.  In January 2019, she reported some chest pressure and she underwent stress testing which showed a small, mild apical defect, which was felt to most likely secondary to breast attenuation.  She was medically managed.  She did well over the course of 2019.  She continues to work part-time at [employer] where she exerts herself a fair amount and does not typically experience symptoms.  Over the past week however, she has been experiencing a very mild-4/10 substernal chest pressure without associated symptoms, most frequently occurring at rest, not worsened by exertion, lasting hours at a time, and resolving spontaneously.  Symptoms wax and waned over the course of the last week and she also began to notice that her blood pressures were trending in the 140s and 150s.  On the evening of January 31, after having symptoms for much of the day, she took a nitroglycerin and thinks that symptoms resolved within about 30 minutes.  She spoke to her daughter on February 1 about her symptoms and her daughter told her to present to the  ED for evaluation.  Here, ECG showed no acute changes.  Troponins have been normal.  She has had intermittent recurrent chest discomfort while at rest during hospitalization.  Discomfort does not seem to worsen with deep breathing, position changes, or palpation.  Inpatient Medications    . aspirin EC  81 mg Oral Daily  . atorvastatin  40 mg Oral QHS  . bisoprolol  5 mg Oral QHS  . canagliflozin  100 mg Oral QAC breakfast  . clotrimazole   Topical BID  . [START ON 02/21/2018] Dulaglutide  1.5 mg  Subcutaneous Q Sat  . fenofibrate  160 mg Oral Daily  . glipiZIDE  10 mg Oral Q breakfast  . insulin aspart  0-5 Units Subcutaneous QHS  . insulin aspart  0-9 Units Subcutaneous TID WC  . levothyroxine  125 mcg Oral QAC breakfast  . metFORMIN  1,000 mg Oral BID WC  . nortriptyline  25 mg Oral QHS  . pantoprazole  40 mg Oral Daily  . rOPINIRole  0.5 mg Oral QHS    Family History    Family History  Problem Relation Age of Onset  . AAA (abdominal aortic aneurysm) Mother   . Diabetes Father    She indicated that her mother is deceased. She indicated that her father is deceased. She indicated that only one of her four sisters is alive. She indicated that both of her brothers are alive.   Social History    Social History   Socioeconomic History  . Marital status: Divorced    Spouse name: Not on file  . Number of children: Not on file  . Years of education: Not on file  . Highest education level: Not on file  Occupational History  . Not on file  Social Needs  . Financial resource strain: Not on file  . Food insecurity:    Worry: Not on file    Inability: Not on file  . Transportation needs:    Medical: Not on file    Non-medical: Not on file  Tobacco Use  . Smoking status: Never Smoker  . Smokeless tobacco: Never Used  Substance and Sexual Activity  . Alcohol use: Yes    Comment: OCCASIONALLY  . Drug use: No  . Sexual activity: Not on file  Lifestyle  . Physical activity:    Days per week: Not on file    Minutes per session: Not on file  . Stress: Not on file  Relationships  . Social connections:    Talks on phone: Not on file    Gets together: Not on file    Attends religious service: Not on file    Active member of club or organization: Not on file    Attends meetings of clubs or organizations: Not on file    Relationship status: Not on file  . Intimate partner violence:    Fear of current or ex partner: Not on file    Emotionally abused: Not on file     Physically abused: Not on file    Forced sexual activity: Not on file  Other Topics Concern  . Not on file  Social History Narrative  . Not on file     Review of Systems    General:  No chills, fever, night sweats or weight changes.  Cardiovascular:  +++ chest heaviness, no dyspnea on exertion, edema, orthopnea, palpitations, paroxysmal nocturnal dyspnea. Dermatological: No rash, lesions/masses Respiratory: No cough, dyspnea Urologic: No hematuria, dysuria Abdominal:   No nausea, vomiting,  diarrhea, bright red blood per rectum, melena, or hematemesis Neurologic:  No visual changes, wkns, changes in mental status. All other systems reviewed and are otherwise negative except as noted above.  Physical Exam    Blood pressure (!) 124/59, pulse 74, temperature 97.8 F (36.6 C), temperature source Oral, resp. rate 18, height 5\' 4"  (1.626 m), weight 82 kg, SpO2 94 %.  General: Pleasant, NAD Psych: Normal affect. Neuro: Alert and oriented X 3. Moves all extremities spontaneously. HEENT: Normal  Neck: Supple without bruits or JVD. Lungs:  Resp regular and unlabored, CTA. Heart: RRR no s3, s4, or murmurs. Abdomen: Soft, non-tender, non-distended, BS + x 4.  Extremities: No clubbing, cyanosis or edema. DP/PT/Radials 2+ and equal bilaterally.  Labs     Recent Labs    02/15/18 0009 02/15/18 0253 02/15/18 0530 02/15/18 0747  TROPONINI <0.03 <0.03 <0.03 <0.03   Lab Results  Component Value Date   WBC 11.3 (H) 02/15/2018   HGB 11.9 (L) 02/15/2018   HCT 39.5 02/15/2018   MCV 85.5 02/15/2018   PLT 292 02/15/2018    Recent Labs  Lab 02/14/18 1743  NA 139  K 3.9  CL 104  CO2 22  BUN 14  CREATININE 0.78  CALCIUM 10.1  GLUCOSE 174*   Lab Results  Component Value Date   CHOL 183 08/13/2017   HDL 47 08/13/2017   LDLCALC 61 08/13/2017   TRIG 375 (H) 08/13/2017     Radiology Studies    Dg Chest 2 View  Result Date: 02/14/2018 CLINICAL DATA:  Acute chest pain. EXAM:  CHEST - 2 VIEW COMPARISON:  None. FINDINGS: The cardiomediastinal silhouette is unremarkable. Mild elevation of the RIGHT hemidiaphragm is likely chronic. There is no evidence of focal airspace disease, pulmonary edema, suspicious pulmonary nodule/mass, pleural effusion, or pneumothorax. No acute bony abnormalities are identified. IMPRESSION: No active cardiopulmonary disease. Electronically Signed   By: Margarette Canada M.D.   On: 02/14/2018 19:29    ECG & Cardiac Imaging    Regular sinus rhythm, 88, left axis deviation, no acute changes- personally reviewed.  Assessment & Plan    1.  Midsternal chest discomfort/coronary artery disease: Patient with prior history of CAD status post LAD stenting in March 2017 in [place].  She says at that time, she had a rock like substernal chest heaviness that was worse with exertion and associated with dyspnea.  Over the past 2 weeks, she has had intermittent mild chest pressure that typically occurs at rest, lasts hours at a time, and does not change with exertion.  There are no associated symptoms.  Because of symptoms and some elevated blood pressure readings at home, she presented to the emergency department on February 1.  Here, ECG was nonacute and troponins normal.  She is chest pain-free this morning.  We discussed options for evaluation.  Though we initially discussed possible discharge with outpatient stress testing, due to recurrent symptoms at rest, we agreed that it would likely be best to keep her in the hospital and have stress testing performed in the a.m.  Continue aspirin, statin, beta-blocker therapy.  2.  Essential hypertension: Patient recently noting elevated blood pressures.  Pressure stable here.  Continue beta-blocker.  3.  Hyperlipidemia/hypertriglyceridemia: Continue statin and fibroid therapy.  We can consider addition of vascepa as an outpatient.  4.  Type 2 diabetes mellitus: I will place her Metformin on hold in case she requires  catheterization.  Signed, Murray Hodgkins, NP 02/15/2018, 12:42 PM  For  questions or updates, please contact   Please consult [email or web address] for contact info under Cardiology/STEMI.  Patient seen and examined with the above-signed Advanced Practice Provider and/or Housestaff. I personally reviewed laboratory data, imaging studies and relevant notes. I independently examined the patient and formulated the important aspects of the plan. I have edited the note to reflect any of my changes or salient points. I have personally discussed the plan with the patient and/or family.  68 y/o woman with Dm2 and CAD s/p LAD stenting in 2017. Presents with 1 week h/o progressive CP. CP occurs both at rest and less with exertion. ECG and troponin are normal. Echo done at bedside and reviewed personally shows EF 60% and no RWMA.   Initially we had planned on stress testing but prior to my arrival patient walked to bathroom and had recurrent exertional CP. We have thus discussed need for catha nd she agrees to proceed. Will schedule for tomorrow. Continue ASA, statin and b-blocker.   Glori Bickers, MD  2:16 PM

## 2018-02-16 ENCOUNTER — Encounter (HOSPITAL_COMMUNITY)
Admission: EM | Disposition: A | Discharge status: Home / Self Care | Payer: Self-pay | Source: Home / Self Care | Attending: Emergency Medicine

## 2018-02-16 DIAGNOSIS — R079 Chest pain, unspecified: Secondary | ICD-10-CM | POA: Diagnosis not present

## 2018-02-16 DIAGNOSIS — E785 Hyperlipidemia, unspecified: Secondary | ICD-10-CM | POA: Diagnosis not present

## 2018-02-16 DIAGNOSIS — E119 Type 2 diabetes mellitus without complications: Secondary | ICD-10-CM | POA: Diagnosis not present

## 2018-02-16 HISTORY — PX: LEFT HEART CATH AND CORONARY ANGIOGRAPHY: CATH118249

## 2018-02-16 LAB — GLUCOSE, CAPILLARY
GLUCOSE-CAPILLARY: 109 mg/dL — AB (ref 70–99)
GLUCOSE-CAPILLARY: 79 mg/dL (ref 70–99)
Glucose-Capillary: 104 mg/dL — ABNORMAL HIGH (ref 70–99)
Glucose-Capillary: 54 mg/dL — ABNORMAL LOW (ref 70–99)
Glucose-Capillary: 179 mg/dL — ABNORMAL HIGH (ref 70–99)

## 2018-02-16 LAB — CBC
HCT: 41.1 % (ref 36.0–46.0)
HEMOGLOBIN: 12.9 g/dL (ref 12.0–15.0)
MCH: 26.9 pg (ref 26.0–34.0)
MCHC: 31.4 g/dL (ref 30.0–36.0)
MCV: 85.8 fL (ref 80.0–100.0)
Platelets: 315 10*3/uL (ref 150–400)
RBC: 4.79 MIL/uL (ref 3.87–5.11)
RDW: 16 % — ABNORMAL HIGH (ref 11.5–15.5)
WBC: 10.7 10*3/uL — ABNORMAL HIGH (ref 4.0–10.5)
nRBC: 0 % (ref 0.0–0.2)

## 2018-02-16 LAB — HEPARIN LEVEL (UNFRACTIONATED): Heparin Unfractionated: 0.33 IU/mL (ref 0.30–0.70)

## 2018-02-16 SURGERY — LEFT HEART CATH AND CORONARY ANGIOGRAPHY
Anesthesia: LOCAL

## 2018-02-16 MED ORDER — SODIUM CHLORIDE 0.9% FLUSH: 3.0000 mL | Freq: Two times a day (BID) | INTRAVENOUS | Status: DC

## 2018-02-16 MED ORDER — LIDOCAINE HCL (PF) 1 % IJ SOLN
INTRAMUSCULAR | Status: DC | PRN
  Administered 2018-02-16: 2 mL

## 2018-02-16 MED ORDER — IOHEXOL 350 MG/ML SOLN
INTRAVENOUS | Status: DC | PRN
  Administered 2018-02-16: 60 mL via INTRAVENOUS

## 2018-02-16 MED ORDER — VERAPAMIL HCL 2.5 MG/ML IV SOLN
INTRAVENOUS | Status: DC | PRN
  Administered 2018-02-16 (×2): via INTRA_ARTERIAL

## 2018-02-16 MED ORDER — HEPARIN (PORCINE) IN NACL 1000-0.9 UT/500ML-% IV SOLN
INTRAVENOUS | Status: AC
  Filled 2018-02-16: qty 500

## 2018-02-16 MED ORDER — ASPIRIN 81 MG PO CHEW: 81.0000 mg | CHEWABLE_TABLET | Freq: Every day | ORAL | Status: DC

## 2018-02-16 MED ORDER — SODIUM CHLORIDE 0.9 % IV SOLN: 250.0000 mL | INTRAVENOUS | Status: DC | PRN

## 2018-02-16 MED ORDER — SODIUM CHLORIDE 0.9% FLUSH: 3.0000 mL | INTRAVENOUS | Status: DC | PRN

## 2018-02-16 MED ORDER — HEPARIN SODIUM (PORCINE) 1000 UNIT/ML IJ SOLN
INTRAMUSCULAR | Status: DC | PRN
  Administered 2018-02-16: 4000 [IU] via INTRAVENOUS

## 2018-02-16 MED ORDER — CLOPIDOGREL BISULFATE 75 MG PO TABS
75.0000 mg | ORAL_TABLET | Freq: Every day | ORAL | Status: DC
  Filled 2018-02-16: qty 1

## 2018-02-16 MED ORDER — SODIUM CHLORIDE 0.9 % IV SOLN: INTRAVENOUS | Status: AC

## 2018-02-16 MED ORDER — VERAPAMIL HCL 2.5 MG/ML IV SOLN
INTRAVENOUS | Status: AC
  Filled 2018-02-16: qty 2

## 2018-02-16 MED ORDER — ACETAMINOPHEN 325 MG PO TABS: 650.0000 mg | ORAL_TABLET | ORAL | Status: DC | PRN

## 2018-02-16 MED ORDER — HEPARIN (PORCINE) IN NACL 1000-0.9 UT/500ML-% IV SOLN
INTRAVENOUS | Status: DC | PRN
  Administered 2018-02-16 (×2): 500 mL

## 2018-02-16 MED ORDER — ATORVASTATIN CALCIUM 80 MG PO TABS: 80.0000 mg | ORAL_TABLET | Freq: Every day | ORAL | Status: DC

## 2018-02-16 MED ORDER — MORPHINE SULFATE (PF) 2 MG/ML IV SOLN: 2.0000 mg | INTRAVENOUS | Status: DC | PRN

## 2018-02-16 MED ORDER — ONDANSETRON HCL 4 MG/2ML IJ SOLN: 4.0000 mg | Freq: Four times a day (QID) | INTRAMUSCULAR | Status: DC | PRN

## 2018-02-16 MED ORDER — MORPHINE SULFATE (PF) 10 MG/ML IV SOLN: 2.0000 mg | INTRAVENOUS | Status: DC | PRN

## 2018-02-16 SURGICAL SUPPLY — 13 items
CATH INFINITI 5 FR JL3.5 (CATHETERS) ×2 IMPLANT
CATH INFINITI 5FR ANG PIGTAIL (CATHETERS) ×2 IMPLANT
CATH INFINITI 5FR JL4 (CATHETERS) ×2 IMPLANT
CATH OPTITORQUE TIG 4.0 5F (CATHETERS) ×2 IMPLANT
DEVICE RAD COMP TR BAND LRG (VASCULAR PRODUCTS) ×2 IMPLANT
GLIDESHEATH SLEND A-KIT 6F 22G (SHEATH) ×2 IMPLANT
GUIDEWIRE INQWIRE 1.5J.035X260 (WIRE) ×1 IMPLANT
INQWIRE 1.5J .035X260CM (WIRE) ×2
KIT HEART LEFT (KITS) ×2 IMPLANT
PACK CARDIAC CATHETERIZATION (CUSTOM PROCEDURE TRAY) ×2 IMPLANT
TRANSDUCER W/STOPCOCK (MISCELLANEOUS) ×2 IMPLANT
TUBING CIL FLEX 10 FLL-RA (TUBING) ×2 IMPLANT
WIRE HI TORQ VERSACORE-J 145CM (WIRE) ×2 IMPLANT

## 2018-02-16 NOTE — Interval H&P Note (Signed)
Cath Lab Visit (complete for each Cath Lab visit)  Clinical Evaluation Leading to the Procedure:   ACS: Yes.    Non-ACS:    Anginal Classification: CCS III  Anti-ischemic medical therapy: Minimal Therapy (1 class of medications)  Non-Invasive Test Results: No non-invasive testing performed  Prior CABG: No previous CABG      History and Physical Interval Note:  02/16/2018 4:22 PM  Felicia [name]  has presented today for surgery, with the diagnosis of unstable angina  The various methods of treatment have been discussed with the patient and family. After consideration of risks, benefits and other options for treatment, the patient has consented to  Procedure(s): LEFT HEART CATH AND CORONARY ANGIOGRAPHY (N/A) as a surgical intervention .  The patient's history has been reviewed, patient examined, no change in status, stable for surgery.  I have reviewed the patient's chart and labs.  Questions were answered to the patient's satisfaction.     Felicia [name]

## 2018-02-16 NOTE — Progress Notes (Addendum)
Progress Note  Patient Name: Felicia [name] Date of Encounter: 02/16/2018  Primary Cardiologist: Mertie Moores, MD    Subjective   68 year old female with a history of coronary artery disease.  She is status post stenting of her left anterior descending artery in 2007.  She presents with a week long history of progressive chest pain.  This pain has been occurring with rest and with exertion.  She has had some exertional chest pain here in the hospital and so she was scheduled for heart catheterization.   Inpatient Medications    Scheduled Meds: . aspirin EC  81 mg Oral Daily  . atorvastatin  40 mg Oral QHS  . bisoprolol  5 mg Oral QHS  . canagliflozin  100 mg Oral QAC breakfast  . clotrimazole   Topical BID  . [START ON 02/21/2018] Dulaglutide  1.5 mg Subcutaneous Q Sat  . fenofibrate  160 mg Oral Daily  . glipiZIDE  10 mg Oral Q breakfast  . insulin aspart  0-5 Units Subcutaneous QHS  . insulin aspart  0-9 Units Subcutaneous TID WC  . levothyroxine  125 mcg Oral QAC breakfast  . metFORMIN  1,000 mg Oral BID WC  . nortriptyline  25 mg Oral QHS  . pantoprazole  40 mg Oral Daily  . rOPINIRole  0.5 mg Oral QHS  . sodium chloride flush  3 mL Intravenous Q12H   Continuous Infusions: . sodium chloride    . sodium chloride 1 mL/kg/hr (02/16/18 0556)  . heparin 1,250 Units/hr (02/15/18 1715)   PRN Meds: sodium chloride, acetaminophen, diclofenac sodium, metroNIDAZOLE, nitroGLYCERIN, ondansetron (ZOFRAN) IV, sodium chloride flush, triamcinolone ointment   Vital Signs    Vitals:   02/15/18 1716 02/15/18 2142 02/16/18 0000 02/16/18 0500  BP: 119/72 (!) 117/54 (!) 107/58 118/61  Pulse: 77 70 79 74  Resp: 19 18 18 16   Temp: 98.5 F (36.9 C) 98.2 F (36.8 C) 97.9 F (36.6 C) 98.4 F (36.9 C)  TempSrc: Oral  Oral Oral  SpO2: 98% 95% 93% 95%  Weight:      Height:        Intake/Output Summary (Last 24 hours) at 02/16/2018 1118 Last data filed at 02/16/2018 9983 Gross per 24 hour   Intake 1378.46 ml  Output -  Net 1378.46 ml   Last 3 Weights 02/14/2018 02/14/2018 11/18/2017  Weight (lbs) 180 lb 12.4 oz 182 lb 184 lb 12.8 oz  Weight (kg) 82 kg 82.555 kg 83.825 kg      Telemetry    NSR  - Personally Reviewed  ECG     NSR  - Personally Reviewed  Physical Exam   GEN:  Middle-aged female, no acute distress.   Neck: No JVD Cardiac: RRR, no murmurs, rubs, or gallops.  Respiratory: Clear to auscultation bilaterally. GI: Soft, nontender, non-distended  MS: No edema; No deformity. Neuro:  Nonfocal  Psych: Normal affect   Labs    Chemistry Recent Labs  Lab 02/14/18 1743  NA 139  K 3.9  CL 104  CO2 22  GLUCOSE 174*  BUN 14  CREATININE 0.78  CALCIUM 10.1  GFRNONAA >60  GFRAA >60  ANIONGAP 13     Hematology Recent Labs  Lab 02/14/18 1743 02/15/18 0530 02/16/18 0506  WBC 9.4 11.3* 10.7*  RBC 5.07 4.62 4.79  HGB 13.3 11.9* 12.9  HCT 43.8 39.5 41.1  MCV 86.4 85.5 85.8  MCH 26.2 25.8* 26.9  MCHC 30.4 30.1 31.4  RDW 16.0* 15.9* 16.0*  PLT  334 292 315    Cardiac Enzymes Recent Labs  Lab 02/15/18 0009 02/15/18 0253 02/15/18 0530 02/15/18 0747  TROPONINI <0.03 <0.03 <0.03 <0.03   No results for input(s): TROPIPOC in the last 168 hours.   BNPNo results for input(s): BNP, PROBNP in the last 168 hours.   DDimer No results for input(s): DDIMER in the last 168 hours.   Radiology    Dg Chest 2 View  Result Date: 02/14/2018 CLINICAL DATA:  Acute chest pain. EXAM: CHEST - 2 VIEW COMPARISON:  None. FINDINGS: The cardiomediastinal silhouette is unremarkable. Mild elevation of the RIGHT hemidiaphragm is likely chronic. There is no evidence of focal airspace disease, pulmonary edema, suspicious pulmonary nodule/mass, pleural effusion, or pneumothorax. No acute bony abnormalities are identified. IMPRESSION: No active cardiopulmonary disease. Electronically Signed   By: Margarette Canada M.D.   On: 02/14/2018 19:29    Cardiac Studies     Patient  Profile     68 y.o. female a known history of coronary artery disease.  She presents with a week long history of intermittent chest pain.  Assessment & Plan    .  Coronary artery disease: The patient presents with a weeklong history of chest pain.  Her chest pain feels fairly similar to her previous episodes of angina prior to her stenting in 2017.  The pain is not quite as severe.  Have exertional chest discomfort yesterday while walking to the bathroom. Agree with plans for heart catheterization.  I have discussed the risks, benefits, options.  She understands and agrees to proceed.  2.  Hyperlipidemia: Continue atorvastatin. Her last triglyceride level was 375.  Her LDL is 61.  Total cholesterol is 183. New fenofibrate.  She may need an appointment with the lipid clinic for further advice regarding her markedly elevated triglyceride level. Needs to work on a better diet, exercise, weight loss program.  For questions or updates, please contact [place] Please consult [email or web address] for contact info under        Signed, Mertie Moores, MD  02/16/2018, 11:18 AM

## 2018-02-16 NOTE — Progress Notes (Signed)
Pt transported off unit to Cath lab. Delia Heady RN

## 2018-02-16 NOTE — Progress Notes (Signed)
I responded to a [place] to provide Advance Directive information for the patient. I visited the patient's rooms and gave an overview of the AD and answered her questions. I left a copy of the AD for her to review with her daughter and to complete at a later time. I shared that the Chaplain is available to provide additional support as needed or requested.    02/16/18 1100  Clinical Encounter Type  Visited With Patient  Visit Type Spiritual support  Referral From Nurse  Consult/Referral To Chaplain  Spiritual Encounters  Spiritual Needs Prayer;Literature    Chaplain Dr Redgie Grayer

## 2018-02-16 NOTE — Care Management Obs Status (Signed)
[place] NOTIFICATION   Patient Details  Name: Felicia [name] MRN: [id number] Date of Birth: 10-Dec-1950   Medicare Observation Status Notification Given:  Yes    Midge Minium RN, BSN, NCM-BC, ACM-RN [phone] 02/16/2018, 11:14 AM

## 2018-02-16 NOTE — Progress Notes (Signed)
Progress Note    Felicia [name]  YHC:[id number] DOB: [birth year]  DOA: 02/14/2018 PCP: Dineen Kid, MD    Brief Narrative:     Medical records reviewed and are as summarized below:  Felicia [name] is an 68 y.o. female with medical history significant of CAD, HTN, DM2 and Hyperlipidemia who has had previous stent and has not had any Cardiac workup for 2 years here with Chest pain on and off for the last 1 week. Pain is at 5/10, mainly pressure, no cough, no NVD no diaphoresis. Patient has no radiation.  She took some nitroglycerin which gave her some relief.  Await heart cath planned for later today.  Assessment/Plan:   Principal Problem:   Chest pain Active Problems:   Coronary artery disease involving native coronary artery of native heart without angina pectoris   Hyperlipidemia LDL goal <70   Type 2 diabetes mellitus without complication, without long-term current use of insulin (HCC)   Dizziness  chest pain:  -known coronary artery disease, patient will be admitted.  -plan is for heart cath -appreciate cardiology consult -echo: with preserved EF   diabetes type 2 -SSI while in hospital -hold home meds   hypertension:   hyperlipidemia:  -Continue with Lipitor.  Obesity Estimated body mass index is 31.03 kg/m as calculated from the following:   Height as of this encounter: 5\' 4"  (1.626 m).   Weight as of this encounter: 82 kg.   Family Communication/Anticipated D/C date and plan/Code Status   DVT prophylaxis: heparin gtt Code Status: Full Code.  Family Communication:  Disposition Plan:    Medical Consultants:    cards  Subjective:   Waiting on cath-- no chest pain overnight-- did have some right leg pain  Objective:    Vitals:   02/15/18 2142 02/16/18 0000 02/16/18 0500 02/16/18 1218  BP: (!) 117/54 (!) 107/58 118/61 123/68  Pulse: 70 79 74 81  Resp: 18 18 16 18   Temp: 98.2 F (36.8 C) 97.9 F (36.6 C) 98.4 F (36.9 C) 97.8 F (36.6 C)   TempSrc:  Oral Oral Oral  SpO2: 95% 93% 95% 96%  Weight:      Height:        Intake/Output Summary (Last 24 hours) at 02/16/2018 1255 Last data filed at 02/16/2018 1206 Gross per 24 hour  Intake 1464.06 ml  Output -  Net 1464.06 ml   Filed Weights   02/14/18 1722  Weight: 82 kg    Exam: In bed, NAD rrr No wheezing, no increased work of breathing +BS, soft  Data Reviewed:   I have personally reviewed following labs and imaging studies:  Labs: Labs show the following:   Basic Metabolic Panel: Recent Labs  Lab 02/14/18 1743  NA 139  K 3.9  CL 104  CO2 22  GLUCOSE 174*  BUN 14  CREATININE 0.78  CALCIUM 10.1   GFR Estimated Creatinine Clearance: 69.7 mL/min (by C-G formula based on SCr of 0.78 mg/dL). Liver Function Tests: No results for input(s): AST, ALT, ALKPHOS, BILITOT, PROT, ALBUMIN in the last 168 hours. No results for input(s): LIPASE, AMYLASE in the last 168 hours. No results for input(s): AMMONIA in the last 168 hours. Coagulation profile No results for input(s): INR, PROTIME in the last 168 hours.  CBC: Recent Labs  Lab 02/14/18 1743 02/15/18 0530 02/16/18 0506  WBC 9.4 11.3* 10.7*  HGB 13.3 11.9* 12.9  HCT 43.8 39.5 41.1  MCV 86.4 85.5 85.8  PLT 334 292 315  Cardiac Enzymes: Recent Labs  Lab 02/14/18 1743 02/15/18 0009 02/15/18 0253 02/15/18 0530 02/15/18 0747  TROPONINI <0.03 <0.03 <0.03 <0.03 <0.03   BNP (last 3 results) No results for input(s): PROBNP in the last 8760 hours. CBG: Recent Labs  Lab 02/15/18 1222 02/15/18 1632 02/15/18 2118 02/16/18 0625 02/16/18 1213  GLUCAP 129* 143* 147* 109* 104*   D-Dimer: No results for input(s): DDIMER in the last 72 hours. Hgb A1c: No results for input(s): HGBA1C in the last 72 hours. Lipid Profile: No results for input(s): CHOL, HDL, LDLCALC, TRIG, CHOLHDL, LDLDIRECT in the last 72 hours. Thyroid function studies: No results for input(s): TSH, T4TOTAL, T3FREE, THYROIDAB in  the last 72 hours.  Invalid input(s): FREET3 Anemia work up: No results for input(s): VITAMINB12, FOLATE, FERRITIN, TIBC, IRON, RETICCTPCT in the last 72 hours. Sepsis Labs: Recent Labs  Lab 02/14/18 1743 02/15/18 0530 02/16/18 0506  WBC 9.4 11.3* 10.7*    Microbiology No results found for this or any previous visit (from the past 240 hour(s)).  Procedures and diagnostic studies:  Dg Chest 2 View  Result Date: 02/14/2018 CLINICAL DATA:  Acute chest pain. EXAM: CHEST - 2 VIEW COMPARISON:  None. FINDINGS: The cardiomediastinal silhouette is unremarkable. Mild elevation of the RIGHT hemidiaphragm is likely chronic. There is no evidence of focal airspace disease, pulmonary edema, suspicious pulmonary nodule/mass, pleural effusion, or pneumothorax. No acute bony abnormalities are identified. IMPRESSION: No active cardiopulmonary disease. Electronically Signed   By: Margarette Canada M.D.   On: 02/14/2018 19:29    Medications:   . aspirin EC  81 mg Oral Daily  . atorvastatin  40 mg Oral QHS  . bisoprolol  5 mg Oral QHS  . canagliflozin  100 mg Oral QAC breakfast  . clotrimazole   Topical BID  . [START ON 02/21/2018] Dulaglutide  1.5 mg Subcutaneous Q Sat  . fenofibrate  160 mg Oral Daily  . glipiZIDE  10 mg Oral Q breakfast  . insulin aspart  0-5 Units Subcutaneous QHS  . insulin aspart  0-9 Units Subcutaneous TID WC  . levothyroxine  125 mcg Oral QAC breakfast  . metFORMIN  1,000 mg Oral BID WC  . nortriptyline  25 mg Oral QHS  . pantoprazole  40 mg Oral Daily  . rOPINIRole  0.5 mg Oral QHS  . sodium chloride flush  3 mL Intravenous Q12H   Continuous Infusions: . sodium chloride    . sodium chloride 1 mL/kg/hr (02/16/18 0556)  . heparin 1,250 Units/hr (02/15/18 1715)     LOS: 0 days   Geradine Girt  [facility]   How to contact the [facility] Attending or Consulting provider [place] or covering provider during after hours [place], for this patient?  1. Check the care team in  [facility] and look for a) attending/consulting TR[facility] provider listed and b) the [facility] team listed 2. Log into [email or web address] and use [place]'s universal password to access. If you do not have the password, please contact the hospital operator. 3. Locate the [facility] provider you are looking for under [facility] and page to a number that you can be directly reached. 4. If you still have difficulty reaching the provider, please page the [facility] (Director on Call) for the Hospitalists listed on amion for assistance.  02/16/2018, 12:55 PM

## 2018-02-16 NOTE — Progress Notes (Signed)
ANTICOAGULATION CONSULT NOTE - Follow Up Consult  Pharmacy Consult for Heparin Indication: chest pain/ACS  Allergies  Allergen Reactions  . Estrogens Other (See Comments)    PATIENT HAS A HISTORY OF CANCER AND HAS BEEN TOLD TO NEVER TAKE ANYTHING CONTAINING ESTROGEN, AS IT MIGHT CAUSE A RECURRENCE  . Shrimp [Shellfish Allergy] Nausea And Vomiting    Patient Measurements: Height: 5\' 4"  (162.6 cm) Weight: 180 lb 12.4 oz (82 kg) IBW/kg (Calculated) : 54.7 Heparin Dosing Weight: 72.5 kg  Vital Signs: Temp: 98.4 F (36.9 C) (02/03 0500) Temp Source: Oral (02/03 0500) BP: 118/61 (02/03 0500) Pulse Rate: 74 (02/03 0500)  Labs: Recent Labs    02/14/18 1743  02/15/18 0253 02/15/18 0530 02/15/18 0747 02/15/18 0940 02/15/18 1649 02/16/18 0506  HGB 13.3  --   --  11.9*  --   --   --  12.9  HCT 43.8  --   --  39.5  --   --   --  41.1  PLT 334  --   --  292  --   --   --  315  HEPARINUNFRC  --   --   --   --   --  0.16* 0.28* 0.33  CREATININE 0.78  --   --   --   --   --   --   --   TROPONINI <0.03   < > <0.03 <0.03 <0.03  --   --   --    < > = values in this interval not displayed.    Estimated Creatinine Clearance: 69.7 mL/min (by C-G formula based on SCr of 0.78 mg/dL).  Assessment:  68 yr old female admitted with chest pain on 02/14/18.  Pharmacy consulted for IV heparin. Hx PCI.      Heparin level is low therapeutic (0.33) today on 1250 units/hr. CBC stable.   Planning cardiac cath today.  Goal of Therapy:  Heparin level 0.3-0.7 units/ml Monitor platelets by anticoagulation protocol: Yes   Plan:   Continue heparin drip at 1250 units/hr  Daily heparin level and CBC while on heparin.  Follow up post-cath.  Arty Baumgartner, [place] Pager: [phone] 02/16/2018,10:36 AM

## 2018-02-16 NOTE — H&P (View-Only) (Signed)
Progress Note  Patient Name: Felicia [name] Date of Encounter: 02/16/2018  Primary Cardiologist: Mertie Moores, MD    Subjective   68 year old female with a history of coronary artery disease.  She is status post stenting of her left anterior descending artery in 2007.  She presents with a week long history of progressive chest pain.  This pain has been occurring with rest and with exertion.  She has had some exertional chest pain here in the hospital and so she was scheduled for heart catheterization.   Inpatient Medications    Scheduled Meds: . aspirin EC  81 mg Oral Daily  . atorvastatin  40 mg Oral QHS  . bisoprolol  5 mg Oral QHS  . canagliflozin  100 mg Oral QAC breakfast  . clotrimazole   Topical BID  . [START ON 02/21/2018] Dulaglutide  1.5 mg Subcutaneous Q Sat  . fenofibrate  160 mg Oral Daily  . glipiZIDE  10 mg Oral Q breakfast  . insulin aspart  0-5 Units Subcutaneous QHS  . insulin aspart  0-9 Units Subcutaneous TID WC  . levothyroxine  125 mcg Oral QAC breakfast  . metFORMIN  1,000 mg Oral BID WC  . nortriptyline  25 mg Oral QHS  . pantoprazole  40 mg Oral Daily  . rOPINIRole  0.5 mg Oral QHS  . sodium chloride flush  3 mL Intravenous Q12H   Continuous Infusions: . sodium chloride    . sodium chloride 1 mL/kg/hr (02/16/18 0556)  . heparin 1,250 Units/hr (02/15/18 1715)   PRN Meds: sodium chloride, acetaminophen, diclofenac sodium, metroNIDAZOLE, nitroGLYCERIN, ondansetron (ZOFRAN) IV, sodium chloride flush, triamcinolone ointment   Vital Signs    Vitals:   02/15/18 1716 02/15/18 2142 02/16/18 0000 02/16/18 0500  BP: 119/72 (!) 117/54 (!) 107/58 118/61  Pulse: 77 70 79 74  Resp: 19 18 18 16   Temp: 98.5 F (36.9 C) 98.2 F (36.8 C) 97.9 F (36.6 C) 98.4 F (36.9 C)  TempSrc: Oral  Oral Oral  SpO2: 98% 95% 93% 95%  Weight:      Height:        Intake/Output Summary (Last 24 hours) at 02/16/2018 1118 Last data filed at 02/16/2018 1448 Gross per 24 hour   Intake 1378.46 ml  Output -  Net 1378.46 ml   Last 3 Weights 02/14/2018 02/14/2018 11/18/2017  Weight (lbs) 180 lb 12.4 oz 182 lb 184 lb 12.8 oz  Weight (kg) 82 kg 82.555 kg 83.825 kg      Telemetry    NSR  - Personally Reviewed  ECG     NSR  - Personally Reviewed  Physical Exam   GEN:  Middle-aged female, no acute distress.   Neck: No JVD Cardiac: RRR, no murmurs, rubs, or gallops.  Respiratory: Clear to auscultation bilaterally. GI: Soft, nontender, non-distended  MS: No edema; No deformity. Neuro:  Nonfocal  Psych: Normal affect   Labs    Chemistry Recent Labs  Lab 02/14/18 1743  NA 139  K 3.9  CL 104  CO2 22  GLUCOSE 174*  BUN 14  CREATININE 0.78  CALCIUM 10.1  GFRNONAA >60  GFRAA >60  ANIONGAP 13     Hematology Recent Labs  Lab 02/14/18 1743 02/15/18 0530 02/16/18 0506  WBC 9.4 11.3* 10.7*  RBC 5.07 4.62 4.79  HGB 13.3 11.9* 12.9  HCT 43.8 39.5 41.1  MCV 86.4 85.5 85.8  MCH 26.2 25.8* 26.9  MCHC 30.4 30.1 31.4  RDW 16.0* 15.9* 16.0*  PLT  334 292 315    Cardiac Enzymes Recent Labs  Lab 02/15/18 0009 02/15/18 0253 02/15/18 0530 02/15/18 0747  TROPONINI <0.03 <0.03 <0.03 <0.03   No results for input(s): TROPIPOC in the last 168 hours.   BNPNo results for input(s): BNP, PROBNP in the last 168 hours.   DDimer No results for input(s): DDIMER in the last 168 hours.   Radiology    Dg Chest 2 View  Result Date: 02/14/2018 CLINICAL DATA:  Acute chest pain. EXAM: CHEST - 2 VIEW COMPARISON:  None. FINDINGS: The cardiomediastinal silhouette is unremarkable. Mild elevation of the RIGHT hemidiaphragm is likely chronic. There is no evidence of focal airspace disease, pulmonary edema, suspicious pulmonary nodule/mass, pleural effusion, or pneumothorax. No acute bony abnormalities are identified. IMPRESSION: No active cardiopulmonary disease. Electronically Signed   By: Margarette Canada M.D.   On: 02/14/2018 19:29    Cardiac Studies     Patient  Profile     68 y.o. female a known history of coronary artery disease.  She presents with a week long history of intermittent chest pain.  Assessment & Plan    .  Coronary artery disease: The patient presents with a weeklong history of chest pain.  Her chest pain feels fairly similar to her previous episodes of angina prior to her stenting in 2017.  The pain is not quite as severe.  Have exertional chest discomfort yesterday while walking to the bathroom. Agree with plans for heart catheterization.  I have discussed the risks, benefits, options.  She understands and agrees to proceed.  2.  Hyperlipidemia: Continue atorvastatin. Her last triglyceride level was 375.  Her LDL is 61.  Total cholesterol is 183. New fenofibrate.  She may need an appointment with the lipid clinic for further advice regarding her markedly elevated triglyceride level. Needs to work on a better diet, exercise, weight loss program.  For questions or updates, please contact [place] Please consult [email or web address] for contact info under        Signed, Mertie Moores, MD  02/16/2018, 11:18 AM

## 2018-02-16 NOTE — Progress Notes (Signed)
TR BAND REMOVAL  LOCATION:    right radial  DEFLATED PER PROTOCOL:    Yes.    TIME BAND OFF / DRESSING APPLIED:    1855   SITE UPON ARRIVAL:    Level 0  SITE AFTER BAND REMOVAL:    Level 0  CIRCULATION SENSATION AND MOVEMENT:    Within Normal Limits   Yes.    COMMENTS:   Rechecked at bedside report with no change in assessment.

## 2018-02-17 ENCOUNTER — Encounter (HOSPITAL_COMMUNITY): Payer: Self-pay | Admitting: Cardiovascular Disease

## 2018-02-17 DIAGNOSIS — E785 Hyperlipidemia, unspecified: Secondary | ICD-10-CM | POA: Diagnosis not present

## 2018-02-17 DIAGNOSIS — I2510 Atherosclerotic heart disease of native coronary artery without angina pectoris: Secondary | ICD-10-CM | POA: Diagnosis not present

## 2018-02-17 DIAGNOSIS — E119 Type 2 diabetes mellitus without complications: Secondary | ICD-10-CM | POA: Diagnosis not present

## 2018-02-17 DIAGNOSIS — R079 Chest pain, unspecified: Secondary | ICD-10-CM | POA: Diagnosis not present

## 2018-02-17 LAB — CBC
HCT: 39.4 % (ref 36.0–46.0)
HEMOGLOBIN: 12.3 g/dL (ref 12.0–15.0)
MCH: 26.7 pg (ref 26.0–34.0)
MCHC: 31.2 g/dL (ref 30.0–36.0)
MCV: 85.7 fL (ref 80.0–100.0)
Platelets: 315 10*3/uL (ref 150–400)
RBC: 4.6 MIL/uL (ref 3.87–5.11)
RDW: 15.9 % — ABNORMAL HIGH (ref 11.5–15.5)
WBC: 9.3 10*3/uL (ref 4.0–10.5)
nRBC: 0 % (ref 0.0–0.2)

## 2018-02-17 LAB — BASIC METABOLIC PANEL
Anion gap: 8 (ref 5–15)
BUN: 17 mg/dL (ref 8–23)
CO2: 23 mmol/L (ref 22–32)
Calcium: 9.7 mg/dL (ref 8.9–10.3)
Chloride: 108 mmol/L (ref 98–111)
Creatinine, Ser: 0.85 mg/dL (ref 0.44–1.00)
GFR calc Af Amer: >60 mL/min (ref >60)
GFR calc non Af Amer: >60 mL/min (ref >60)
Glucose, Bld: 154 mg/dL — ABNORMAL HIGH (ref 70–99)
Potassium: 4 mmol/L (ref 3.5–5.1)
Sodium: 139 mmol/L (ref 135–145)

## 2018-02-17 LAB — GLUCOSE, CAPILLARY: Glucose-Capillary: 162 mg/dL — ABNORMAL HIGH (ref 70–99)

## 2018-02-17 MED ORDER — HYDROCORTISONE 1 % EX CREA
TOPICAL_CREAM | Freq: Two times a day (BID) | CUTANEOUS | Status: DC
  Filled 2018-02-17: qty 28

## 2018-02-17 MED ORDER — METFORMIN HCL 500 MG PO TABS: 1000.0000 mg | ORAL_TABLET | ORAL | Status: DC

## 2018-02-17 MED ORDER — HYDROCORTISONE 0.5 % EX CREA
TOPICAL_CREAM | Freq: Two times a day (BID) | CUTANEOUS | Status: DC
  Filled 2018-02-17: qty 28.35

## 2018-02-17 MED ORDER — ATORVASTATIN CALCIUM 80 MG PO TABS: 80.0000 mg | ORAL_TABLET | Freq: Every day | ORAL | 0 refills | Status: DC

## 2018-02-17 NOTE — Progress Notes (Addendum)
Progress Note  Patient Name: Felicia [name] Date of Encounter: 02/17/2018  Primary Cardiologist: Mertie Moores, MD   Subjective   No chest pain overnight.  Right wrist cath site is stable.  Inpatient Medications    Scheduled Meds: . aspirin EC  81 mg Oral Daily  . atorvastatin  80 mg Oral q1800  . bisoprolol  5 mg Oral QHS  . clopidogrel  75 mg Oral Q breakfast  . clotrimazole   Topical BID  . [START ON 02/21/2018] Dulaglutide  1.5 mg Subcutaneous Q Sat  . fenofibrate  160 mg Oral Daily  . insulin aspart  0-5 Units Subcutaneous QHS  . insulin aspart  0-9 Units Subcutaneous TID WC  . levothyroxine  125 mcg Oral QAC breakfast  . nortriptyline  25 mg Oral QHS  . pantoprazole  40 mg Oral Daily  . rOPINIRole  0.5 mg Oral QHS  . sodium chloride flush  3 mL Intravenous Q12H   Continuous Infusions: . sodium chloride     PRN Meds: sodium chloride, acetaminophen, diclofenac sodium, metroNIDAZOLE, morphine injection, nitroGLYCERIN, ondansetron (ZOFRAN) IV, sodium chloride flush, triamcinolone ointment   Vital Signs    Vitals:   02/16/18 1921 02/16/18 2000 02/16/18 2216 02/17/18 0614  BP: (!) 131/59  (!) 111/58 (!) 101/41  Pulse: 90 90 72 67  Resp: 18 20 17 16   Temp:   97.6 F (36.4 C) 97.8 F (36.6 C)  TempSrc:   Oral Oral  SpO2: 99% 99% 98% 96%  Weight:    80.4 kg  Height:        Intake/Output Summary (Last 24 hours) at 02/17/2018 0831 Last data filed at 02/16/2018 2217 Gross per 24 hour  Intake 934.02 ml  Output -  Net 934.02 ml   Last 3 Weights 02/17/2018 02/14/2018 02/14/2018  Weight (lbs) 177 lb 4 oz 180 lb 12.4 oz 182 lb  Weight (kg) 80.4 kg 82 kg 82.555 kg      Telemetry    Sinus rhythm - Personally Reviewed  ECG    No new tracings - Personally Reviewed  Physical Exam   GEN: No acute distress.   Neck: No JVD Cardiac: RRR, no murmurs, rubs, or gallops.  Respiratory: Clear to auscultation bilaterally. GI: Soft, nontender, non-distended  MS: No edema; No  deformity. Neuro:  Nonfocal  Psych: Normal affect   Labs    Chemistry Recent Labs  Lab 02/14/18 1743 02/17/18 0533  NA 139 139  K 3.9 4.0  CL 104 108  CO2 22 23  GLUCOSE 174* 154*  BUN 14 17  CREATININE 0.78 0.85  CALCIUM 10.1 9.7  GFRNONAA >60 >60  GFRAA >60 >60  ANIONGAP 13 8     Hematology Recent Labs  Lab 02/15/18 0530 02/16/18 0506 02/17/18 0533  WBC 11.3* 10.7* 9.3  RBC 4.62 4.79 4.60  HGB 11.9* 12.9 12.3  HCT 39.5 41.1 39.4  MCV 85.5 85.8 85.7  MCH 25.8* 26.9 26.7  MCHC 30.1 31.4 31.2  RDW 15.9* 16.0* 15.9*  PLT 292 315 315    Cardiac Enzymes Recent Labs  Lab 02/15/18 0009 02/15/18 0253 02/15/18 0530 02/15/18 0747  TROPONINI <0.03 <0.03 <0.03 <0.03   No results for input(s): TROPIPOC in the last 168 hours.   BNPNo results for input(s): BNP, PROBNP in the last 168 hours.   DDimer No results for input(s): DDIMER in the last 168 hours.   Radiology    No results found.  Cardiac Studies   Left heart cath 02/16/2018  IMPRESSION: [name] has a widely patent proximal LAD stent with no other significant CAD.  She has normal filling pressures.  I believe her chest pain is noncardiac.  The sheath was removed and a TR band was placed on the right wrist to achieve patent hemostasis.  The patient left the lab in stable condition.  Antiplatelet/Anticoag Recommend Aspirin 81mg  daily for moderate CAD.   Diagnostic  Dominance: Right    _________________  Echocardiogram 02/15/2018 1. The left ventricle has normal systolic function of 69-62%. The cavity size is normal. There is no left ventricular wall thickness. Echo evidence of impaired relaxation diastolic filling patterns. Normal left ventricular filling pressures.  2. Normal left atrial size.  3. Normal right atrial size.  4. Normal tricuspid valve.  5. No atrial level shunt detected by color flow Doppler.  FINDINGS  Left Ventricle: No evidence of left ventricular regional wall motion  abnormalities. The left ventricle has normal systolic function of 95-28%. The cavity size is normal. There is no left ventricular wall thickness. Echo evidence of impaired relaxation  diastolic filling patterns. Normal left ventricular filling pressures. Right Ventricle: The right ventricle is normal in size. There is normal hypertrophy. There is normal systolic function. Right ventricular systolic pressure could not be assessed. Left Atrium: The left atrium is normal in size. Right Atrium: The right atrial size is normal in size. Interatrial Septum: No atrial level shunt detected by color flow Doppler.    Patient Profile     68 y.o. female with known history of coronary artery disease.  She presents with a week long history of intermittent chest pain.  Assessment & Plan    CAD -Here for evaluation of long hx of intermittent chest pain.  -Cardiac cath done today showed widely patent prior proximal LAD stent with no other significant CAD.  Normal filling pressures.  Her chest pain is believed to be noncardiac in origin. -Patient was monitored overnight following cath.  She is stable with no current chest pain. -There is question of possible GERD causing her symptoms.  She will need to follow-up with primary care. -Continue aspirin 81 mg and statin for moderate CAD.  Hyperlipidemia -Continues on atorvastatin.  Triglycerides elevated at 375.  LDL 61, TC 183.  She is newly placed on fenofibrate.  She may need appointment with the lipid clinic for further advice regarding markedly elevated triglyceride level. -Patient needs to work on heart healthy diet, exercise and weight loss.   [facility] will sign off.   Medication Recommendations:  Aspirin 81 mg adily Other recommendations (labs, testing, etc):  none Follow up as an outpatient:  3-4 weeks with Dr. Acie Fredrickson or APP.   For questions or updates, please contact [place] Please consult [email or web address] for contact info under         Signed, Daune Perch, NP  02/17/2018, 8:31 AM     Attending Note:   The patient was seen and examined.  Agree with assessment and plan as noted above.  Changes made to the above note as needed.  Patient seen and independently examined with  Pecolia Ades, NP .   We discussed all aspects of the encounter. I agree with the assessment and plan as stated above.  1.  Coronary artery disease: Tyrika presented to the hospital with atypical chest pain.  Pulmonary levels remain negative.  Because of her pain she had heart catheterization.  Her LAD stent was found to be widely patent.  Is now discharged in satisfactory condition.  She will remain on aspirin 81 mg a day.  She has fresh nitroglycerin at home.   she will see our nurse practitioner in 3 to 4 weeks and I will see her in several months.   I have spent a total of 40 minutes with patient reviewing hospital  notes , telemetry, EKGs, labs and examining patient as well as establishing an assessment and plan that was discussed with the patient. > 50% of time was spent in direct patient care.    Thayer Headings, Brooke Bonito., MD, [facility] 02/17/2018, 10:07 AM [address]  [place] Pager [phone]

## 2018-02-17 NOTE — Discharge Instructions (Signed)
Radial Site Care ° °This sheet gives you information about how to care for yourself after your procedure. Your health care provider may also give you more specific instructions. If you have problems or questions, contact your health care provider. °What can I expect after the procedure? °After the procedure, it is common to have: °· Bruising and tenderness at the catheter insertion area. °Follow these instructions at home: °Medicines °· Take over-the-counter and prescription medicines only as told by your health care provider. °Insertion site care °· Follow instructions from your health care provider about how to take care of your insertion site. Make sure you: °? Wash your hands with soap and water before you change your bandage (dressing). If soap and water are not available, use hand sanitizer. °? Change your dressing as told by your health care provider. °? Leave stitches (sutures), skin glue, or adhesive strips in place. These skin closures may need to stay in place for 2 weeks or longer. If adhesive strip edges start to loosen and curl up, you may trim the loose edges. Do not remove adhesive strips completely unless your health care provider tells you to do that. °· Check your insertion site every day for signs of infection. Check for: °? Redness, swelling, or pain. °? Fluid or blood. °? Pus or a bad smell. °? Warmth. °· Do not take baths, swim, or use a hot tub until your health care provider approves. °· You may shower 24-48 hours after the procedure, or as directed by your health care provider. °? Remove the dressing and gently wash the site with plain soap and water. °? Pat the area dry with a clean towel. °? Do not rub the site. That could cause bleeding. °· Do not apply powder or lotion to the site. °Activity ° °· For 24 hours after the procedure, or as directed by your health care provider: °? Do not flex or bend the affected arm. °? Do not push or pull heavy objects with the affected arm. °? Do not  drive yourself home from the hospital or clinic. You may drive 24 hours after the procedure unless your health care provider tells you not to. °? Do not operate machinery or power tools. °· Do not lift anything that is heavier than 10 lb (4.5 kg), or the limit that you are told, until your health care provider says that it is safe. °· Ask your health care provider when it is okay to: °? Return to work or school. °? Resume usual physical activities or sports. °? Resume sexual activity. °General instructions °· If the catheter site starts to bleed, raise your arm and put firm pressure on the site. If the bleeding does not stop, get help right away. This is a medical emergency. °· If you went home on the same day as your procedure, a responsible adult should be with you for the first 24 hours after you arrive home. °· Keep all follow-up visits as told by your health care provider. This is important. °Contact a health care provider if: °· You have a fever. °· You have redness, swelling, or yellow drainage around your insertion site. °Get help right away if: °· You have unusual pain at the radial site. °· The catheter insertion area swells very fast. °· The insertion area is bleeding, and the bleeding does not stop when you hold steady pressure on the area. °· Your arm or hand becomes pale, cool, tingly, or numb. °These symptoms may represent a serious problem   that is an emergency. Do not wait to see if the symptoms will go away. Get medical help right away. Call your local emergency services (911 in the [place] Do not drive yourself to the hospital. °Summary °· After the procedure, it is common to have bruising and tenderness at the site. °· Follow instructions from your health care provider about how to take care of your radial site wound. Check the wound every day for signs of infection. °· Do not lift anything that is heavier than 10 lb (4.5 kg), or the limit that you are told, until your health care provider says  that it is safe. °This information is not intended to replace advice given to you by your health care provider. Make sure you discuss any questions you have with your health care provider. °Document Released: 02/02/2010 Document Revised: 02/05/2017 Document Reviewed: 02/05/2017 °Elsevier Interactive Patient Education © 2019 [employer] ° °

## 2018-02-17 NOTE — Discharge Summary (Signed)
Physician Discharge Summary  Felicia [name] ION:[id number] DOB: 1950/05/22 DOA: 02/14/2018  PCP: Dineen Kid, MD  Admit date: 02/14/2018 Discharge date: 02/17/2018  Admitted From: home Discharge disposition: home   Recommendations for Outpatient Follow-Up:   1. Consider GI referral for non-cardiac chest pain work up if re-occurs-- ? Need for EGD   Discharge Diagnosis:   Principal Problem:   Chest pain Active Problems:   Coronary artery disease involving native coronary artery of native heart without angina pectoris   Hyperlipidemia LDL goal <70   Type 2 diabetes mellitus without complication, without long-term current use of insulin ([place])   Dizziness    Discharge Condition: Improved.  Diet recommendation: Low sodium, heart healthy  Wound care: None.  Code status: Full.   History of Present Illness:   Felicia [name] is a 68 y.o. female with medical history significant of CAD, HTN, DM2 and Hyperlipidemia who has had previous stent and has not had any Cardiac workup for 2 years here with Chest pain on and off for the last 1 week. Pain is at 5/10, mainly pressure, no cough, no NVD no diaphoresis. Patient has no radiation.  She took some nitroglycerin which gave her some relief.  Currently chest pain is gone.  Patient is therefore being admitted for MI rule out.  Per patient her current symptoms appear similar to what she had before when she had a stent.  She was scheduled to have regular follow-up with her cardiologist this week.Marland Kitchen   Hospital Course by Problem:   CAD -Here for evaluation of long hx of intermittent chest pain.  -Cardiac cath done today showed widely patent prior proximal LAD stent with no other significant CAD.  Normal filling pressures.  Her chest pain is believed to be noncardiac in origin. -Patient was monitored overnight following cath.  She is stable with no current chest pain. -There is question of possible GERD causing her symptoms.  She will need to  follow-up with primary care and consideration given for GI referral for EGD -Continue aspirin 81 mg and statin for moderate CAD.  Hyperlipidemia -per cards: Continues on atorvastatin.  Triglycerides elevated at 375.  LDL 61, TC 183.  She is newly placed on fenofibrate.  She may need appointment with the lipid clinic for further advice regarding markedly elevated triglyceride level. -encouraged heart healthy diet, exercise and weight loss.     Medical Consultants:   cards   Discharge Exam:   Vitals:   02/17/18 0614 02/17/18 0813  BP: (!) 101/41 (!) 114/52  Pulse: 67 68  Resp: 16 17  Temp: 97.8 F (36.6 C) 98.2 F (36.8 C)  SpO2: 96% 99%   Vitals:   02/16/18 2000 02/16/18 2216 02/17/18 0614 02/17/18 0813  BP:  (!) 111/58 (!) 101/41 (!) 114/52  Pulse: 90 72 67 68  Resp: 20 17 16 17   Temp:  97.6 F (36.4 C) 97.8 F (36.6 C) 98.2 F (36.8 C)  TempSrc:  Oral Oral Oral  SpO2: 99% 98% 96% 99%  Weight:   80.4 kg   Height:        General exam: Appears calm and comfortable.     The results of significant diagnostics from this hospitalization (including imaging, microbiology, ancillary and laboratory) are listed below for reference.     Procedures and Diagnostic Studies:   Dg Chest 2 View  Result Date: 02/14/2018 CLINICAL DATA:  Acute chest pain. EXAM: CHEST - 2 VIEW COMPARISON:  None. FINDINGS: The cardiomediastinal silhouette  is unremarkable. Mild elevation of the RIGHT hemidiaphragm is likely chronic. There is no evidence of focal airspace disease, pulmonary edema, suspicious pulmonary nodule/mass, pleural effusion, or pneumothorax. No acute bony abnormalities are identified. IMPRESSION: No active cardiopulmonary disease. Electronically Signed   By: Margarette Canada M.D.   On: 02/14/2018 19:29     Labs:   Basic Metabolic Panel: Recent Labs  Lab 02/14/18 1743 02/17/18 0533  NA 139 139  K 3.9 4.0  CL 104 108  CO2 22 23  GLUCOSE 174* 154*  BUN 14 17  CREATININE  0.78 0.85  CALCIUM 10.1 9.7   GFR Estimated Creatinine Clearance: 65 mL/min (by C-G formula based on SCr of 0.85 mg/dL). Liver Function Tests: No results for input(s): AST, ALT, ALKPHOS, BILITOT, PROT, ALBUMIN in the last 168 hours. No results for input(s): LIPASE, AMYLASE in the last 168 hours. No results for input(s): AMMONIA in the last 168 hours. Coagulation profile No results for input(s): INR, PROTIME in the last 168 hours.  CBC: Recent Labs  Lab 02/14/18 1743 02/15/18 0530 02/16/18 0506 02/17/18 0533  WBC 9.4 11.3* 10.7* 9.3  HGB 13.3 11.9* 12.9 12.3  HCT 43.8 39.5 41.1 39.4  MCV 86.4 85.5 85.8 85.7  PLT 334 292 315 315   Cardiac Enzymes: Recent Labs  Lab 02/14/18 1743 02/15/18 0009 02/15/18 0253 02/15/18 0530 02/15/18 0747  TROPONINI <0.03 <0.03 <0.03 <0.03 <0.03   BNP: Invalid input(s): POCBNP CBG: Recent Labs  Lab 02/16/18 1213 02/16/18 1712 02/16/18 1802 02/16/18 2237 02/17/18 0613  GLUCAP 104* 54* 79 179* 162*   D-Dimer No results for input(s): DDIMER in the last 72 hours. Hgb A1c No results for input(s): HGBA1C in the last 72 hours. Lipid Profile No results for input(s): CHOL, HDL, LDLCALC, TRIG, CHOLHDL, LDLDIRECT in the last 72 hours. Thyroid function studies No results for input(s): TSH, T4TOTAL, T3FREE, THYROIDAB in the last 72 hours.  Invalid input(s): FREET3 Anemia work up No results for input(s): VITAMINB12, FOLATE, FERRITIN, TIBC, IRON, RETICCTPCT in the last 72 hours. Microbiology No results found for this or any previous visit (from the past 240 hour(s)).   Discharge Instructions:   Discharge Instructions    Diet - low sodium heart healthy   Complete by:  As directed    Diet Carb Modified   Complete by:  As directed    Discharge instructions   Complete by:  As directed    Hold metformin for 48 hours after cath   Increase activity slowly   Complete by:  As directed      Allergies as of 02/17/2018      Reactions    Estrogens Other (See Comments)   PATIENT HAS A HISTORY OF CANCER AND HAS BEEN TOLD TO NEVER TAKE ANYTHING CONTAINING ESTROGEN, AS IT MIGHT CAUSE A RECURRENCE   Shrimp [shellfish Allergy] Nausea And Vomiting      Medication List    TAKE these medications   aspirin EC 81 MG tablet Take 81 mg by mouth daily.   atorvastatin 80 MG tablet Commonly known as:  LIPITOR Take 1 tablet (80 mg total) by mouth daily at 6 PM. What changed:    medication strength  how much to take  when to take this   bisoprolol 5 MG tablet Commonly known as:  ZEBETA TAKE 1 TABLET BY MOUTH ONCE DAILY What changed:    when to take this  additional instructions   CALCIUM + D PO Take 1 tablet by mouth daily with supper.  CINNAMON PO Take 1 capsule by mouth daily with supper.   clotrimazole-betamethasone cream Commonly known as:  LOTRISONE Apply 1 application topically See admin instructions. APPLY A PEA-SIZED AMOUNT TWICE DAILY AS NEEDED/AS DIRECTED FOR 10 DAYS   fenofibrate 145 MG tablet Commonly known as:  TRICOR Take 1 tablet (145 mg total) by mouth daily. What changed:  when to take this   FIBERCON PO Take 5 capsules by mouth daily.   GLIPIZIDE XL 10 MG 24 hr tablet Generic drug:  glipiZIDE Take 10 mg by mouth daily with breakfast.   JARDIANCE 25 MG Tabs tablet Generic drug:  empagliflozin Take 25 mg by mouth every morning.   levothyroxine 125 MCG tablet Commonly known as:  SYNTHROID, LEVOTHROID Take 125 mcg by mouth daily before breakfast.   metFORMIN 500 MG tablet Commonly known as:  GLUCOPHAGE Take 2 tablets (1,000 mg total) by mouth See admin instructions. Take 1,000 mg by mouth 2 times a day- with breakfast and supper Start taking on:  February 19, 2018 What changed:  These instructions start on February 19, 2018. If you are unsure what to do until then, ask your doctor or other care provider.   metroNIDAZOLE 1 % gel Commonly known as:  METROGEL Apply 1 application topically  as needed (as directed to affected area).   nitroGLYCERIN 0.4 MG SL tablet Commonly known as:  NITROSTAT Place 1 tablet (0.4 mg total) under the tongue every 5 (five) minutes as needed for chest pain.   nortriptyline 25 MG capsule Commonly known as:  PAMELOR Take 25 mg by mouth at bedtime.   omeprazole 40 MG capsule Commonly known as:  PRILOSEC Take 40 mg by mouth daily with supper.   ONE-A-DAY WOMENS PO Take 1 tablet by mouth daily.   OSTEO BI-FLEX ADV JOINT SHIELD PO Take 1 tablet by mouth daily.   rOPINIRole 0.5 MG tablet Commonly known as:  REQUIP Take 0.5 mg by mouth at bedtime. 1-3 hours prior to bedtime   triamcinolone ointment 0.1 % Commonly known as:  KENALOG Apply 1 application topically 2 (two) times daily as needed (for itching).   TRULICITY 1.5 RD/4.0CX Sopn Generic drug:  Dulaglutide Inject 1.5 mg into the skin every Saturday.   TURMERIC PO Take 1 capsule by mouth daily with supper.   VITAMIN C PO Take 1 tablet by mouth daily.   VOLTAREN 1 % Gel Generic drug:  diclofenac sodium Apply 2-4 g topically 4 (four) times daily as needed (as directed for pain).      Follow-up Information    Nahser, [name], MD Follow up.   Specialty:  Cardiology Why:  Cardiology hospital follow-up on 03/05/2018 at 9:40 AM.  Please arrive 10 minutes early for check-in. Contact information: [place] [address] [address] [phone]            Time coordinating discharge: 25 min  Signed:  Geradine Girt DO  [facility] 02/17/2018, 9:26 AM

## 2018-03-04 ENCOUNTER — Ambulatory Visit: Payer: Medicare Other | Admitting: *Deleted

## 2018-03-05 ENCOUNTER — Ambulatory Visit: Payer: Medicare Other | Admitting: Cardiovascular Disease

## 2018-03-30 ENCOUNTER — Other Ambulatory Visit: Payer: Self-pay

## 2018-03-30 MED ORDER — ATORVASTATIN CALCIUM 80 MG PO TABS: 80.0000 mg | ORAL_TABLET | Freq: Every day | ORAL | 0 refills | Status: DC

## 2018-04-22 ENCOUNTER — Other Ambulatory Visit: Payer: Self-pay | Admitting: Cardiovascular Disease

## 2018-07-05 ENCOUNTER — Other Ambulatory Visit: Payer: Self-pay | Admitting: Cardiovascular Disease

## 2018-09-16 ENCOUNTER — Telehealth: Payer: Medicare Other | Admitting: Cardiovascular Disease

## 2018-09-16 ENCOUNTER — Other Ambulatory Visit: Payer: Self-pay

## 2018-09-16 ENCOUNTER — Ambulatory Visit (INDEPENDENT_AMBULATORY_CARE_PROVIDER_SITE_OTHER): Payer: Medicare Other | Admitting: Cardiovascular Disease

## 2018-09-16 ENCOUNTER — Encounter: Payer: Self-pay | Admitting: Cardiovascular Disease

## 2018-09-16 VITALS — BP 122/60 | HR 86 | Ht 64.0 in | Wt 177.8 lb

## 2018-09-16 DIAGNOSIS — I2510 Atherosclerotic heart disease of native coronary artery without angina pectoris: Secondary | ICD-10-CM | POA: Diagnosis not present

## 2018-09-16 DIAGNOSIS — E785 Hyperlipidemia, unspecified: Secondary | ICD-10-CM | POA: Diagnosis not present

## 2018-09-16 MED ORDER — BISOPROLOL FUMARATE 5 MG PO TABS: 5.0000 mg | ORAL_TABLET | Freq: Every day | ORAL | 3 refills | Status: DC

## 2018-09-16 MED ORDER — NITROGLYCERIN 0.4 MG SL SUBL: 0.4000 mg | SUBLINGUAL_TABLET | SUBLINGUAL | 6 refills | Status: DC | PRN

## 2018-09-16 MED ORDER — FENOFIBRATE 145 MG PO TABS: 145.0000 mg | ORAL_TABLET | Freq: Every day | ORAL | 3 refills | Status: DC

## 2018-09-16 MED ORDER — ATORVASTATIN CALCIUM 80 MG PO TABS: ORAL_TABLET | ORAL | 3 refills | Status: DC

## 2018-09-16 NOTE — Progress Notes (Signed)
Cardiology Office Note:    Date:  09/16/2018   ID:  Felicia [name], DOB [birth year], MRN [id number]  PCP:  Kathyrn Lass, MD  Cardiologist:  Mertie Moores, MD    Referring MD: Dineen Kid, MD   Chief Complaint  Patient presents with  . Coronary Artery Disease   Problem list 1.  Coronary artery disease - March, 2017,  Resolute Integrity 2.25 x 18 mm stent - mid LAD  2.  Diabetes mellitus 3.  Hypothyroidism 4.  Hyperlipidemia     Felicia [name] is a 68 y.o. female with a hx of coronary artery disease, diabetes mellitus, and hypothyroidism.  She recently moved from [place].  She is here to establish cardiology care.  Has had some unusual chest pressure .   Not exactily like her previous discomfort prior stenting . Had a stress echo which suggested  CAD.    Has chest pressure -   Band like sensation,  Does not exercise She does not think it worsens with exertion. ,  Does not radiate, not associated with increased dyspnea.  Not associated with presyncope .  The sensation comes and goes.   The pain has been present for the past 3 months .   Is on Bisoprilol 5 mg a day  Tried Metoprolol but this caused lots of fatigue and sleepyness.   May 06, 2017: Our were seen today for follow-up of her coronary artery disease.  She is status post stenting of her mid LAD March 2017. Is a history of hyperlipidemia. No angina, Exercising in the pool regularly  Has lots of arthritis .  Suggested a stationary bike or elliptical bike   Sept. 2020  Has been working - Advertising copywriter in [employer]  Not much cardiiac exercise  No CP or dyspnea  We discussed her elevated Triglyceride levels and discussed Vascepa - she does not think she could afford it   Past Medical History:  Diagnosis Date  . CAD (coronary artery disease)    a. 03/2015 s/p DES to the LAD [facility]); b. 01/2017 MV: EF 80%, small, mild apical ant defect w/o ischemia (felt to be breast atten). Low risk.  . Depression   . DM  (diabetes mellitus) ([place])   . Essential hypertension   . GERD (gastroesophageal reflux disease)   . High triglycerides   . Hyperlipidemia   . Hypothyroid   . OA (osteoarthritis)   . Overflow incontinence   . RLS (restless legs syndrome)   . Uterine cancer [facility])     Past Surgical History:  Procedure Laterality Date  . CORONARY STENT PLACEMENT    . CORONARY/GRAFT ACUTE MI REVASCULARIZATION    . FOOT SURGERY Left   . LEFT HEART CATH AND CORONARY ANGIOGRAPHY N/A 02/16/2018   Procedure: LEFT HEART CATH AND CORONARY ANGIOGRAPHY;  Surgeon: Lorretta Harp, MD;  Location: [place] [facility];  Service: Cardiovascular;  Laterality: N/A;  . REPLACEMENT TOTAL KNEE Right   . SPINE SURGERY    . TOTAL ABDOMINAL HYSTERECTOMY     FOR UTERUS CANCER  . TOTAL HIP ARTHROPLASTY Left     Current Medications: Current Meds  Medication Sig  . Ascorbic Acid (VITAMIN C PO) Take 1 tablet by mouth daily.  Marland Kitchen aspirin EC 81 MG tablet Take 81 mg by mouth daily.  Marland Kitchen atorvastatin (LIPITOR) 80 MG tablet TAKE 1 TABLET BY MOUTH ONCE DAILY AT 6 PM  . bisoprolol (ZEBETA) 5 MG tablet Take 1 tablet (5 mg total) by mouth daily.  Marland Kitchen  Calcium Citrate-Vitamin D (CALCIUM + D PO) Take 1 tablet by mouth daily with supper.  . Calcium Polycarbophil (FIBERCON PO) Take 5 capsules by mouth daily.  . celecoxib (CELEBREX) 200 MG capsule Take 1 capsule by mouth 2 (two) times daily.  Marland Kitchen CINNAMON PO Take 1 capsule by mouth daily with supper.  . clotrimazole-betamethasone (LOTRISONE) cream Apply 1 application topically See admin instructions. APPLY A PEA-SIZED AMOUNT TWICE DAILY AS NEEDED/AS DIRECTED FOR 10 DAYS  . diclofenac sodium (VOLTAREN) 1 % GEL Apply 2-4 g topically 4 (four) times daily as needed (as directed for pain).  . Dulaglutide (TRULICITY) 1.5 0000000 SOPN Inject 1.5 mg into the skin every Saturday.   . fenofibrate (TRICOR) 145 MG tablet Take 1 tablet (145 mg total) by mouth daily.  Marland Kitchen GLIPIZIDE XL 10 MG 24 hr tablet Take 10  mg by mouth daily with breakfast.   . JARDIANCE 25 MG TABS tablet Take 25 mg by mouth every morning.   Marland Kitchen levothyroxine (SYNTHROID, LEVOTHROID) 125 MCG tablet Take 125 mcg by mouth daily before breakfast.  . metFORMIN (GLUCOPHAGE) 500 MG tablet Take 2 tablets (1,000 mg total) by mouth See admin instructions. Take 1,000 mg by mouth 2 times a day- with breakfast and supper  . metroNIDAZOLE (METROGEL) 1 % gel Apply 1 application topically as needed (as directed to affected area).   . Misc Natural Products (OSTEO BI-FLEX ADV JOINT SHIELD PO) Take 1 tablet by mouth daily.  . Multiple Vitamins-Calcium (ONE-A-DAY WOMENS PO) Take 1 tablet by mouth daily.  . nitroGLYCERIN (NITROSTAT) 0.4 MG SL tablet Place 1 tablet (0.4 mg total) under the tongue every 5 (five) minutes as needed for chest pain.  . nortriptyline (PAMELOR) 25 MG capsule Take 25 mg by mouth at bedtime.   Marland Kitchen omeprazole (PRILOSEC) 40 MG capsule Take 40 mg by mouth daily with supper.   Marland Kitchen rOPINIRole (REQUIP) 0.5 MG tablet Take 0.5 mg by mouth at bedtime. 1-3 hours prior to bedtime  . triamcinolone ointment (KENALOG) 0.1 % Apply 1 application topically 2 (two) times daily as needed (for itching).  . TURMERIC PO Take 1 capsule by mouth daily with supper.  . [DISCONTINUED] atorvastatin (LIPITOR) 80 MG tablet TAKE 1 TABLET BY MOUTH ONCE DAILY AT 6 PM  . [DISCONTINUED] bisoprolol (ZEBETA) 5 MG tablet TAKE 1 TABLET BY MOUTH ONCE DAILY  . [DISCONTINUED] fenofibrate (TRICOR) 145 MG tablet Take 1 tablet by mouth once daily  . [DISCONTINUED] nitroGLYCERIN (NITROSTAT) 0.4 MG SL tablet Place 1 tablet (0.4 mg total) under the tongue every 5 (five) minutes as needed for chest pain.     Allergies:   Estrogens and Shrimp [shellfish allergy]   Social History   Socioeconomic History  . Marital status: Divorced    Spouse name: Not on file  . Number of children: Not on file  . Years of education: Not on file  . Highest education level: Not on file   Occupational History  . Not on file  Social Needs  . Financial resource strain: Not on file  . Food insecurity    Worry: Not on file    Inability: Not on file  . Transportation needs    Medical: Not on file    Non-medical: Not on file  Tobacco Use  . Smoking status: Never Smoker  . Smokeless tobacco: Never Used  Substance and Sexual Activity  . Alcohol use: Yes    Comment: OCCASIONALLY  . Drug use: No  . Sexual activity: Not on file  Lifestyle  . Physical activity    Days per week: Not on file    Minutes per session: Not on file  . Stress: Not on file  Relationships  . Social Herbalist on phone: Not on file    Gets together: Not on file    Attends religious service: Not on file    Active member of club or organization: Not on file    Attends meetings of clubs or organizations: Not on file    Relationship status: Not on file  Other Topics Concern  . Not on file  Social History Narrative  . Not on file     Family History: The patient's family history includes AAA (abdominal aortic aneurysm) in her mother; Diabetes in her father. ROS:   Please see the history of present illness.     All other systems reviewed and are negative.   Physical Exam: Blood pressure 122/60, pulse 86, height 5\' 4"  (1.626 m), weight 177 lb 12.8 oz (80.6 kg), SpO2 98 %.  GEN:  Well nourished, well developed in no acute distress HEENT: Normal NECK: No JVD; No carotid bruits LYMPHATICS: No lymphadenopathy CARDIAC: RRR , no murmurs, rubs, gallops RESPIRATORY:  Clear to auscultation without rales, wheezing or rhonchi  ABDOMEN: Soft, non-tender, non-distended MUSCULOSKELETAL:  No edema; No deformity  SKIN: Warm and dry NEUROLOGIC:  Alert and oriented x 3    Recent Labs: 02/17/2018: BUN 17; Creatinine, Ser 0.85; Hemoglobin 12.3; Platelets 315; Potassium 4.0; Sodium 139  Recent Lipid Panel    Component Value Date/Time   CHOL 183 08/13/2017 0859   TRIG 375 (H) 08/13/2017 0859    HDL 47 08/13/2017 0859   CHOLHDL 3.9 08/13/2017 0859   LDLCALC 61 08/13/2017 0859      EKGs/Labs/Other Studies Reviewed:    EKG:       ASSESSMENT:    1. Coronary artery disease involving native coronary artery of native heart without angina pectoris    PLAN:      1.   Coronary artery disease:  ,  Stable , no angina   2.  Hyperlipidemia:   Continue atorva.  Fenofibrate. We discussed Vascepa .  She does not think she can afford it   Increase diet and exercise   Medication Adjustments/Labs and Tests Ordered: Current medicines are reviewed at length with the patient today.  Concerns regarding medicines are outlined above.  Orders Placed This Encounter  Procedures  . Basic metabolic panel  . Hepatic function panel  . Lipid panel   Meds ordered this encounter  Medications  . atorvastatin (LIPITOR) 80 MG tablet    Sig: TAKE 1 TABLET BY MOUTH ONCE DAILY AT 6 PM    Dispense:  90 tablet    Refill:  3  . bisoprolol (ZEBETA) 5 MG tablet    Sig: Take 1 tablet (5 mg total) by mouth daily.    Dispense:  90 tablet    Refill:  3  . fenofibrate (TRICOR) 145 MG tablet    Sig: Take 1 tablet (145 mg total) by mouth daily.    Dispense:  90 tablet    Refill:  3  . nitroGLYCERIN (NITROSTAT) 0.4 MG SL tablet    Sig: Place 1 tablet (0.4 mg total) under the tongue every 5 (five) minutes as needed for chest pain.    Dispense:  25 tablet    Refill:  6    Signed, Mertie Moores, MD  09/16/2018 5:36 PM    [facility]

## 2018-09-16 NOTE — Patient Instructions (Addendum)
Medication Instructions:  No changes If you need a refill on your cardiac medications before your next appointment, please call your pharmacy.   Lab work: Prior to your next visit - have bmet/lipids/liver done If you have labs (blood work) drawn today and your tests are completely normal, you will receive your results only by: Marland Kitchen MyChart Message (if you have MyChart) OR . A paper copy in the mail If you have any lab test that is abnormal or we need to change your treatment, we will call you to review the results.  Testing/Procedures: none  Follow-Up: At [facility], you and your health needs are our priority.  As part of our continuing mission to provide you with exceptional heart care, we have created designated Provider Care Teams.  These Care Teams include your primary Cardiologist (physician) and Advanced Practice Providers (APPs -  Physician Assistants and Nurse Practitioners) who all work together to provide you with the care you need, when you need it. You will need a follow up appointment in:  6 months.  Please call our office 2 months in advance to schedule this appointment.  You may see one of the following Advanced Practice Providers on your designated Care Team: Richardson Dopp, PA-C [place], [place] . Daune Perch, NP  Any Other Special Instructions Will Be Listed Below (If Applicable).

## 2018-09-17 ENCOUNTER — Telehealth: Payer: Self-pay

## 2018-09-17 DIAGNOSIS — E785 Hyperlipidemia, unspecified: Secondary | ICD-10-CM

## 2018-09-17 LAB — HEPATIC FUNCTION PANEL
ALT: 138 IU/L — ABNORMAL HIGH (ref 0–32)
AST: 119 IU/L — ABNORMAL HIGH (ref 0–40)
Albumin: 4.2 g/dL (ref 3.8–4.8)
Alkaline Phosphatase: 49 IU/L (ref 39–117)
Bilirubin Total: 0.2 mg/dL (ref 0.0–1.2)
Bilirubin, Direct: 0.12 mg/dL (ref 0.00–0.40)
Total Protein: 7.2 g/dL (ref 6.0–8.5)

## 2018-09-17 LAB — BASIC METABOLIC PANEL
BUN/Creatinine Ratio: 24 (ref 12–28)
BUN: 19 mg/dL (ref 8–27)
CO2: 23 mmol/L (ref 20–29)
Calcium: 10.6 mg/dL — ABNORMAL HIGH (ref 8.7–10.3)
Chloride: 104 mmol/L (ref 96–106)
Creatinine, Ser: 0.8 mg/dL (ref 0.57–1.00)
GFR calc Af Amer: 88 mL/min/{1.73_m2} (ref >59)
GFR calc non Af Amer: 76 mL/min/{1.73_m2} (ref >59)
Glucose: 135 mg/dL — ABNORMAL HIGH (ref 65–99)
Potassium: 4.9 mmol/L (ref 3.5–5.2)
Sodium: 140 mmol/L (ref 134–144)

## 2018-09-17 LAB — LIPID PANEL
Chol/HDL Ratio: 3 ratio (ref 0.0–4.4)
Cholesterol, Total: 130 mg/dL (ref 100–199)
HDL: 44 mg/dL (ref >39)
LDL Chol Calc (NIH): 49 mg/dL (ref 0–99)
Triglycerides: 232 mg/dL — ABNORMAL HIGH (ref 0–149)
VLDL Cholesterol Cal: 37 mg/dL (ref 5–40)

## 2018-09-17 NOTE — Addendum Note (Signed)
Addended by: Polly Cobia A on: 09/17/2018 12:43 PM   Modules accepted: Orders

## 2018-09-17 NOTE — Telephone Encounter (Signed)
Pt. Informed of elevated liver enzymes and Dr. Elmarie Shiley orders to stop fenofibrate, hold atorvastatin for 1 week, then resume atorvastatin and to come to office to have fasting liver enzymes rechecked in 1-2 weeks. She verbalizes understanding and agrees to plan.

## 2018-09-25 ENCOUNTER — Other Ambulatory Visit: Payer: Self-pay

## 2018-09-25 ENCOUNTER — Other Ambulatory Visit: Payer: Medicare Other | Admitting: *Deleted

## 2018-09-25 DIAGNOSIS — E785 Hyperlipidemia, unspecified: Secondary | ICD-10-CM

## 2018-09-25 LAB — HEPATIC FUNCTION PANEL
ALT: 111 IU/L — ABNORMAL HIGH (ref 0–32)
AST: 78 IU/L — ABNORMAL HIGH (ref 0–40)
Albumin: 4.4 g/dL (ref 3.8–4.8)
Alkaline Phosphatase: 57 IU/L (ref 39–117)
Bilirubin Total: 0.3 mg/dL (ref 0.0–1.2)
Bilirubin, Direct: 0.11 mg/dL (ref 0.00–0.40)
Total Protein: 7.3 g/dL (ref 6.0–8.5)

## 2018-09-28 ENCOUNTER — Telehealth: Payer: Self-pay | Admitting: Nurse Practitioner

## 2018-09-28 DIAGNOSIS — R748 Abnormal levels of other serum enzymes: Secondary | ICD-10-CM

## 2018-09-28 DIAGNOSIS — E782 Mixed hyperlipidemia: Secondary | ICD-10-CM

## 2018-09-28 DIAGNOSIS — E781 Pure hyperglyceridemia: Secondary | ICD-10-CM

## 2018-09-28 NOTE — Telephone Encounter (Signed)
Patient called back she has a few more questions about the message below.

## 2018-09-28 NOTE — Telephone Encounter (Signed)
Spoke with patient about elevated liver enzymes and to continue to hold atorvastatin per Dr. Acie Fredrickson. Patient restarted atorvastatin on Thursday per instruction given by our office on 9/3. I advised her to discontinue until further notice. She is scheduled for repeat lab on 10/7. I answered her questions about the causes for elevated liver enzymes and she reports a hx of uterine cancer. I advised her that I will forward results to Dr. Sabra Heck, PCP, and encouraged her to call her for advice also. Patient verbalized understanding and agreement and thanked me for the call.

## 2018-09-28 NOTE — Telephone Encounter (Signed)
Left detailed message requesting patient to continue to hold atorvastatin and call back to schedule a 3 week lab appointment

## 2018-09-28 NOTE — Telephone Encounter (Signed)
-----   Message from Thayer Headings, MD sent at 09/25/2018  6:09 PM EDT ----- Liver enz are better .  Will continue to hold the Atorvastatin for 2 more weeks.   DC fenofibrate Recheck liver enz in 3 weeks

## 2018-10-02 ENCOUNTER — Other Ambulatory Visit: Payer: Self-pay | Admitting: Family Medicine

## 2018-10-02 DIAGNOSIS — R748 Abnormal levels of other serum enzymes: Secondary | ICD-10-CM

## 2018-10-07 ENCOUNTER — Telehealth: Payer: Self-pay | Admitting: *Deleted

## 2018-10-07 NOTE — Telephone Encounter (Signed)
Called and left the patient a message to call the office back. Need to schedule an appt for a new patient survillence

## 2018-10-07 NOTE — Telephone Encounter (Signed)
Patient called back and scheduled an appt for 10/9

## 2018-10-14 ENCOUNTER — Ambulatory Visit
Admission: RE | Admit: 2018-10-14 | Discharge: 2018-10-14 | Disposition: A | Discharge status: Home / Self Care | Payer: Medicare Other | Source: Ambulatory Visit | Attending: Family Medicine | Admitting: Family Medicine

## 2018-10-14 DIAGNOSIS — R748 Abnormal levels of other serum enzymes: Secondary | ICD-10-CM

## 2018-10-20 ENCOUNTER — Other Ambulatory Visit: Payer: Medicare Other

## 2018-10-21 ENCOUNTER — Other Ambulatory Visit: Payer: Medicare Other | Admitting: *Deleted

## 2018-10-21 ENCOUNTER — Other Ambulatory Visit: Payer: Self-pay

## 2018-10-21 DIAGNOSIS — R748 Abnormal levels of other serum enzymes: Secondary | ICD-10-CM

## 2018-10-21 DIAGNOSIS — E782 Mixed hyperlipidemia: Secondary | ICD-10-CM

## 2018-10-21 DIAGNOSIS — E781 Pure hyperglyceridemia: Secondary | ICD-10-CM

## 2018-10-21 LAB — HEPATIC FUNCTION PANEL
ALT: 37 IU/L — ABNORMAL HIGH (ref 0–32)
AST: 28 IU/L (ref 0–40)
Albumin: 4.2 g/dL (ref 3.8–4.8)
Alkaline Phosphatase: 60 IU/L (ref 39–117)
Bilirubin Total: 0.2 mg/dL (ref 0.0–1.2)
Bilirubin, Direct: 0.1 mg/dL (ref 0.00–0.40)
Total Protein: 6.8 g/dL (ref 6.0–8.5)

## 2018-10-22 ENCOUNTER — Telehealth: Payer: Self-pay | Admitting: Nurse Practitioner

## 2018-10-22 DIAGNOSIS — E781 Pure hyperglyceridemia: Secondary | ICD-10-CM

## 2018-10-22 DIAGNOSIS — E782 Mixed hyperlipidemia: Secondary | ICD-10-CM

## 2018-10-22 MED ORDER — ATORVASTATIN CALCIUM 40 MG PO TABS: 40.0000 mg | ORAL_TABLET | Freq: Every day | ORAL | 3 refills | Status: DC

## 2018-10-22 NOTE — Telephone Encounter (Signed)
Reviewed lab results with patient. She states she is scheduled to see a nutritionist for management of fatty liver. She agrees to start atorvastatin 40 mg and will call back to let us know if she is advised to change statin medication. She is scheduled for repeat lab work on 11/11. She thanked me for the call.

## 2018-10-22 NOTE — Telephone Encounter (Signed)
-----   Message from Thayer Headings, MD sent at 10/22/2018 12:59 PM EDT ----- It appears that her Liver enz increased with the addition of fenofibrate to atorvastatin Liver enz have returned to normal Lets restart atorvastatin 40 mg a day  Check liver enz, lipid levels, bmp in 1 month

## 2018-10-23 ENCOUNTER — Inpatient Hospital Stay (HOSPITAL_BASED_OUTPATIENT_CLINIC_OR_DEPARTMENT_OTHER): Payer: Medicare Other | Admitting: Gynecologic Oncology

## 2018-10-23 ENCOUNTER — Encounter: Payer: Self-pay | Admitting: Gynecologic Oncology

## 2018-10-23 ENCOUNTER — Other Ambulatory Visit: Payer: Self-pay

## 2018-10-23 ENCOUNTER — Encounter: Payer: Self-pay | Admitting: Gastroenterology

## 2018-10-23 VITALS — BP 139/74 | HR 82 | Temp 98.2°F | Resp 16 | Ht 64.0 in | Wt 178.0 lb

## 2018-10-23 DIAGNOSIS — E039 Hypothyroidism, unspecified: Secondary | ICD-10-CM | POA: Insufficient documentation

## 2018-10-23 DIAGNOSIS — I2510 Atherosclerotic heart disease of native coronary artery without angina pectoris: Secondary | ICD-10-CM | POA: Diagnosis not present

## 2018-10-23 DIAGNOSIS — Z923 Personal history of irradiation: Secondary | ICD-10-CM | POA: Insufficient documentation

## 2018-10-23 DIAGNOSIS — Z90710 Acquired absence of both cervix and uterus: Secondary | ICD-10-CM | POA: Insufficient documentation

## 2018-10-23 DIAGNOSIS — C541 Malignant neoplasm of endometrium: Secondary | ICD-10-CM | POA: Diagnosis present

## 2018-10-23 DIAGNOSIS — E781 Pure hyperglyceridemia: Secondary | ICD-10-CM | POA: Insufficient documentation

## 2018-10-23 DIAGNOSIS — F329 Major depressive disorder, single episode, unspecified: Secondary | ICD-10-CM | POA: Insufficient documentation

## 2018-10-23 DIAGNOSIS — M1990 Unspecified osteoarthritis, unspecified site: Secondary | ICD-10-CM | POA: Diagnosis not present

## 2018-10-23 DIAGNOSIS — K7460 Unspecified cirrhosis of liver: Secondary | ICD-10-CM

## 2018-10-23 DIAGNOSIS — N39490 Overflow incontinence: Secondary | ICD-10-CM | POA: Diagnosis not present

## 2018-10-23 DIAGNOSIS — R197 Diarrhea, unspecified: Secondary | ICD-10-CM

## 2018-10-23 DIAGNOSIS — K219 Gastro-esophageal reflux disease without esophagitis: Secondary | ICD-10-CM | POA: Insufficient documentation

## 2018-10-23 DIAGNOSIS — E119 Type 2 diabetes mellitus without complications: Secondary | ICD-10-CM | POA: Insufficient documentation

## 2018-10-23 DIAGNOSIS — Z90722 Acquired absence of ovaries, bilateral: Secondary | ICD-10-CM | POA: Diagnosis not present

## 2018-10-23 DIAGNOSIS — I10 Essential (primary) hypertension: Secondary | ICD-10-CM | POA: Insufficient documentation

## 2018-10-23 NOTE — Progress Notes (Signed)
Consult Note: Gyn-Onc  Consult was requested by Felicia. Kathyrn [name] for the evaluation of Felicia [name] 68 y.o. female  CC:  Chief Complaint  Patient presents with  . Endometrial cancer Felicia [name])    Assessment/Plan:  [name]  is a 68 y.o.  year old with a history of FIGO grade 3, stage IA endometrial adenocarcinoma, s/p vaginal brachytherapy completed in February, 2016, now with symptoms of abdominal pain, bloating, diarrhea and abnormal LFT's with cirrhosis on [place].  I have a low suspicion that her symptoms are related to an occult recurrence, however I will have a CT scan of the abdomen and pelvis ordered and if this shows recurrence we would proceed with treatment as indicated.  If the scan fails to show recurrence, I have concern that her symptoms are related to an underlying diagnosis of cirrhosis for unclear etiology.  We are referring her to gastroenterology for further evaluation.  She is now 5 years status post completion of treatment for stage Ia endometrial cancer.  Cancer specific follow-up is not necessary including serial scanning.  It is appropriate for her to continue to see Felicia. Nelda [name] annually for routine wellness GYN care.  HPI: [name] is a 68 year old P1 who is seen in consultation at the request of Felicia Felicia [name] for evaluation of abdominal pains and diarrhea in the setting of a history of endometrial cancer.  I reviewed the patient's medical history from [place].  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  Surgery was on November 24, 2012.  And included a robotic assisted total hysterectomy, BSO, pelvic and periaortic lymph node dissection, and omental biopsy.  Tumor was found only in the endometrium arising in a polyp measuring 1.7 x 1.6 x 0.8 cm.  There was no myometrial invasion, no lymphovascular space invasion, no lower uterine segment involvement.  The cervix was  uninvolved as were the adnexa and lymph nodes and omentum.  Postoperatively she received vaginal brachii therapy completing treatment in the springtime of 2015.  She maintained surveillance in [place] until she relocated to [place] where she has established care with OB/GYN Felicia [name].   The patient reported to me that she has been having persistent chronic diarrhea and a sensation of incomplete emptying of her bowels.  She has abdominal bloating and feels that her gut is sensitive.  Comprehensive metabolic panel that was drawn earlier in September 2020 revealed mildly elevated LFTs.  An abdominal ultrasound performed on October 14, 2018 revealed a liver showing heterogeneous echotexture predominantly hyperechoic with no signs of focal masses.  The portal vein is patent.  The findings were consistent with hepatic steatosis and possible cirrhosis.  The patient reported medical history otherwise significant for history of a cholecystectomy, hip and knee replacements, neck surgery.  She has coronary artery disease and underwent coronary stent placement in 2017.  She is without symptoms of coronary or peripheral vascular disease at present  Current Meds:  Outpatient Encounter Medications as of 10/23/2018  Medication Sig  . Ascorbic Acid (VITAMIN C PO) Take 1 tablet by mouth daily.  Marland Kitchen aspirin EC 81 MG tablet Take 81 mg by mouth daily.  Marland Kitchen atorvastatin (LIPITOR) 40 MG tablet Take 1 tablet (40 mg total) by mouth daily.  . bisoprolol (ZEBETA) 5 MG tablet Take 1 tablet (5 mg total) by mouth daily.  . Calcium Citrate-Vitamin D (CALCIUM + D PO) Take 1  tablet by mouth daily with supper.  . Calcium Polycarbophil (FIBERCON PO) Take 5 capsules by mouth daily.  . celecoxib (CELEBREX) 200 MG capsule Take 1 capsule by mouth 2 (two) times daily.  Marland Kitchen CINNAMON PO Take 1 capsule by mouth daily with supper.  . clotrimazole-betamethasone (LOTRISONE) cream Apply 1 application topically See admin instructions.  APPLY A PEA-SIZED AMOUNT TWICE DAILY AS NEEDED/AS DIRECTED FOR 10 DAYS  . diclofenac sodium (VOLTAREN) 1 % GEL Apply 2-4 g topically 4 (four) times daily as needed (as directed for pain).  . Dulaglutide (TRULICITY) 1.5 0000000 SOPN Inject 1.5 mg into the skin every Saturday.   Marland Kitchen GLIPIZIDE XL 10 MG 24 hr tablet Take 10 mg by mouth daily with breakfast.   . JARDIANCE 25 MG TABS tablet Take 25 mg by mouth every morning.   Marland Kitchen levothyroxine (SYNTHROID, LEVOTHROID) 125 MCG tablet Take 125 mcg by mouth daily before breakfast.  . metFORMIN (GLUCOPHAGE) 500 MG tablet Take 2 tablets (1,000 mg total) by mouth See admin instructions. Take 1,000 mg by mouth 2 times a day- with breakfast and supper  . metroNIDAZOLE (METROGEL) 1 % gel Apply 1 application topically as needed (as directed to affected area).   . Misc Natural Products (OSTEO BI-FLEX ADV JOINT SHIELD PO) Take 1 tablet by mouth daily.  . Multiple Vitamins-Calcium (ONE-A-DAY WOMENS PO) Take 1 tablet by mouth daily.  . nitroGLYCERIN (NITROSTAT) 0.4 MG SL tablet Place 1 tablet (0.4 mg total) under the tongue every 5 (five) minutes as needed for chest pain.  . nortriptyline (PAMELOR) 25 MG capsule Take 25 mg by mouth at bedtime.   Marland Kitchen omeprazole (PRILOSEC) 40 MG capsule Take 40 mg by mouth daily with supper.   Marland Kitchen rOPINIRole (REQUIP) 0.5 MG tablet Take 0.5 mg by mouth at bedtime. 1-3 hours prior to bedtime  . triamcinolone ointment (KENALOG) 0.1 % Apply 1 application topically 2 (two) times daily as needed (for itching).  . TURMERIC PO Take 1 capsule by mouth daily with supper.   No facility-administered encounter medications on file as of 10/23/2018.     Allergy:  Allergies  Allergen Reactions  . Estrogens Other (See Comments)    PATIENT HAS A HISTORY OF CANCER AND HAS BEEN TOLD TO NEVER TAKE ANYTHING CONTAINING ESTROGEN, AS IT MIGHT CAUSE A RECURRENCE  . Shrimp [Shellfish Allergy] Nausea And Vomiting    Social Hx:   Social History   Socioeconomic  History  . Marital status: Divorced    Spouse name: Not on file  . Number of children: Not on file  . Years of education: Not on file  . Highest education level: Not on file  Occupational History  . Not on file  Social Needs  . Financial resource strain: Not on file  . Food insecurity    Worry: Not on file    Inability: Not on file  . Transportation needs    Medical: Not on file    Non-medical: Not on file  Tobacco Use  . Smoking status: Never Smoker  . Smokeless tobacco: Never Used  Substance and Sexual Activity  . Alcohol use: Yes    Comment: OCCASIONALLY  . Drug use: No  . Sexual activity: Not on file  Lifestyle  . Physical activity    Days per week: Not on file    Minutes per session: Not on file  . Stress: Not on file  Relationships  . Social connections    Talks on phone: Not on file  Gets together: Not on file    Attends religious service: Not on file    Active member of club or organization: Not on file    Attends meetings of clubs or organizations: Not on file    Relationship status: Not on file  . Intimate partner violence    Fear of current or ex partner: Not on file    Emotionally abused: Not on file    Physically abused: Not on file    Forced sexual activity: Not on file  Other Topics Concern  . Not on file  Social History Narrative  . Not on file    Past Surgical Hx:  Past Surgical History:  Procedure Laterality Date  . CORONARY STENT PLACEMENT    . CORONARY/GRAFT ACUTE MI REVASCULARIZATION    . FOOT SURGERY Left   . LEFT HEART CATH AND CORONARY ANGIOGRAPHY N/A 02/16/2018   Procedure: LEFT HEART CATH AND CORONARY ANGIOGRAPHY;  Surgeon: Lorretta Harp, MD;  Location: [facility];  Service: Cardiovascular;  Laterality: N/A;  . REPLACEMENT TOTAL KNEE Right   . SPINE SURGERY    . TOTAL ABDOMINAL HYSTERECTOMY     FOR UTERUS CANCER  . TOTAL HIP ARTHROPLASTY Left     Past Medical Hx:  Past Medical History:  Diagnosis Date  . CAD  (coronary artery disease)    a. 03/2015 s/p DES to the LAD [facility]); b. 01/2017 MV: EF 80%, small, mild apical ant defect w/o ischemia (felt to be breast atten). Low risk.  . Depression   . DM (diabetes mellitus) ([place])   . Essential hypertension   . GERD (gastroesophageal reflux disease)   . High triglycerides   . Hyperlipidemia   . Hypothyroid   . OA (osteoarthritis)   . Overflow incontinence   . RLS (restless legs syndrome)   . Uterine cancer [name])     Past Gynecological History:  C/s x1  No LMP recorded. Patient has had a hysterectomy.  Family Hx:  Family History  Problem Relation Age of Onset  . AAA (abdominal aortic aneurysm) Mother   . Diabetes Father   . Breast cancer Cousin     Review of Systems:  Constitutional  Feels well,   ENT Normal appearing ears and nares bilaterally Skin/Breast  No rash, sores, jaundice, itching, dryness Cardiovascular  No chest pain, shortness of breath, or edema  Pulmonary  No cough or wheeze.  Gastro Intestinal  + diarrhea and bloating and intermittent pains Genito Urinary  No frequency, urgency, dysuria, no bleeding Musculo Skeletal  No myalgia, arthralgia, joint swelling or pain  Neurologic  No weakness, numbness, change in gait,  Psychology  No depression, anxiety, insomnia.   Vitals:  Blood pressure 139/74, pulse 82, temperature 98.2 F (36.8 C), temperature source Temporal, resp. rate 16, height 5\' 4"  (1.626 m), weight 178 lb (80.7 kg), SpO2 100 %.  Physical Exam: WD in NAD Neck  Supple NROM, without any enlargements.  Lymph Node Survey No cervical supraclavicular or inguinal adenopathy Cardiovascular  Pulse normal rate, regularity and rhythm. S1 and S2 normal.  Lungs  Clear to auscultation bilateraly, without wheezes/crackles/rhonchi. Good air movement.  Skin  No rash/lesions/breakdown  Psychiatry  Alert and oriented to person, place, and time  Abdomen  Normoactive bowel sounds, abdomen soft, non-tender  and mildly obese (BMI 30) without evidence of hernia.  Back No CVA tenderness Genito Urinary  Vulva/vagina: Normal external female genitalia.  No lesions. No discharge or bleeding.  Bladder/urethra:  No lesions  or masses, well supported bladder  Vagina: normal, no lesions, no palpable masses  Cervix and uterus surgically absent  Adnexa: no palpable masses. Rectal  deferred Extremities  No bilateral cyanosis, clubbing or edema.   Thereasa Solo, MD  10/23/2018, 12:12 PM

## 2018-10-23 NOTE — Patient Instructions (Signed)
Dr Denman George does not feel that there is a recurrence of your cancer causing your symptoms. She is ordering a CT scan to better identify this. If it is normal, you will not require follow-up with her.  She is referring you to see a liver specialist to discuss the cirrhosis and diarrhea.   It is reasonable for you to cancel your appointment with Dr Nelda Marseille this year. However, you should continue to follow-up with her once a year for wellness checks.  Dr Serita Grit office can be reached at [phone].

## 2018-10-26 ENCOUNTER — Other Ambulatory Visit: Payer: Self-pay

## 2018-10-26 ENCOUNTER — Other Ambulatory Visit: Payer: Self-pay | Admitting: Gynecologic Oncology

## 2018-10-26 ENCOUNTER — Inpatient Hospital Stay: Payer: Medicare Other | Attending: Gynecologic Oncology

## 2018-10-26 DIAGNOSIS — C541 Malignant neoplasm of endometrium: Secondary | ICD-10-CM | POA: Diagnosis not present

## 2018-10-26 LAB — BASIC METABOLIC PANEL
Anion gap: 11 (ref 5–15)
BUN: 19 mg/dL (ref 8–23)
CO2: 24 mmol/L (ref 22–32)
Calcium: 9.9 mg/dL (ref 8.9–10.3)
Chloride: 105 mmol/L (ref 98–111)
Creatinine, Ser: 0.8 mg/dL (ref 0.44–1.00)
GFR calc Af Amer: >60 mL/min (ref >60)
GFR calc non Af Amer: >60 mL/min (ref >60)
Glucose, Bld: 170 mg/dL — ABNORMAL HIGH (ref 70–99)
Potassium: 4.1 mmol/L (ref 3.5–5.1)
Sodium: 140 mmol/L (ref 135–145)

## 2018-10-29 ENCOUNTER — Ambulatory Visit (HOSPITAL_COMMUNITY)
Admission: RE | Admit: 2018-10-29 | Discharge: 2018-10-29 | Disposition: A | Discharge status: Home / Self Care | Payer: Medicare Other | Source: Ambulatory Visit | Attending: Gynecologic Oncology | Admitting: Gynecologic Oncology

## 2018-10-29 ENCOUNTER — Encounter (HOSPITAL_COMMUNITY): Payer: Self-pay

## 2018-10-29 ENCOUNTER — Telehealth: Payer: Self-pay

## 2018-10-29 ENCOUNTER — Other Ambulatory Visit: Payer: Self-pay

## 2018-10-29 DIAGNOSIS — C541 Malignant neoplasm of endometrium: Secondary | ICD-10-CM | POA: Insufficient documentation

## 2018-10-29 MED ORDER — IOHEXOL 300 MG/ML  SOLN
100.0000 mL | Freq: Once | INTRAMUSCULAR | Status: AC | PRN
  Administered 2018-10-29: 100 mL via INTRAVENOUS

## 2018-10-29 MED ORDER — SODIUM CHLORIDE (PF) 0.9 % IJ SOLN
INTRAMUSCULAR | Status: AC
  Filled 2018-10-29: qty 50

## 2018-10-29 NOTE — Telephone Encounter (Signed)
Told Felicia [name] that the CT scan showed no findings of recurrent/metastatic endometrial cancer. It does show the early indicator of cirrhosis and a bladder cystocele. She is referred to Dr. Ardis Hughs to f/u the possible cirrhosis. She does experience some urinary continence.  She will discuss the pelvic floor laxity and bladder cystocele with Dr. Nelda Marseille the next time she sees  Her.

## 2018-11-25 ENCOUNTER — Other Ambulatory Visit: Payer: Self-pay

## 2018-11-25 ENCOUNTER — Ambulatory Visit (INDEPENDENT_AMBULATORY_CARE_PROVIDER_SITE_OTHER): Payer: Medicare Other | Admitting: Gastroenterology

## 2018-11-25 ENCOUNTER — Other Ambulatory Visit: Payer: Medicare Other | Admitting: *Deleted

## 2018-11-25 ENCOUNTER — Encounter (INDEPENDENT_AMBULATORY_CARE_PROVIDER_SITE_OTHER): Payer: Self-pay

## 2018-11-25 ENCOUNTER — Encounter: Payer: Self-pay | Admitting: Gastroenterology

## 2018-11-25 VITALS — BP 124/66 | HR 64 | Temp 97.4°F | Ht 64.0 in | Wt 175.0 lb

## 2018-11-25 DIAGNOSIS — Z86010 Personal history of colonic polyps: Secondary | ICD-10-CM

## 2018-11-25 DIAGNOSIS — E782 Mixed hyperlipidemia: Secondary | ICD-10-CM

## 2018-11-25 DIAGNOSIS — R7989 Other specified abnormal findings of blood chemistry: Secondary | ICD-10-CM

## 2018-11-25 DIAGNOSIS — E781 Pure hyperglyceridemia: Secondary | ICD-10-CM

## 2018-11-25 LAB — BASIC METABOLIC PANEL
BUN/Creatinine Ratio: 24 (ref 12–28)
BUN: 18 mg/dL (ref 8–27)
CO2: 21 mmol/L (ref 20–29)
Calcium: 10.1 mg/dL (ref 8.7–10.3)
Chloride: 103 mmol/L (ref 96–106)
Creatinine, Ser: 0.74 mg/dL (ref 0.57–1.00)
GFR calc Af Amer: 96 mL/min/{1.73_m2} (ref >59)
GFR calc non Af Amer: 84 mL/min/{1.73_m2} (ref >59)
Glucose: 141 mg/dL — ABNORMAL HIGH (ref 65–99)
Potassium: 4.6 mmol/L (ref 3.5–5.2)
Sodium: 139 mmol/L (ref 134–144)

## 2018-11-25 LAB — LIPID PANEL
Chol/HDL Ratio: 3.7 ratio (ref 0.0–4.4)
Cholesterol, Total: 165 mg/dL (ref 100–199)
HDL: 45 mg/dL (ref >39)
LDL Chol Calc (NIH): 71 mg/dL (ref 0–99)
Triglycerides: 306 mg/dL — ABNORMAL HIGH (ref 0–149)
VLDL Cholesterol Cal: 49 mg/dL — ABNORMAL HIGH (ref 5–40)

## 2018-11-25 LAB — HEPATIC FUNCTION PANEL
ALT: 36 IU/L — ABNORMAL HIGH (ref 0–32)
AST: 26 IU/L (ref 0–40)
Albumin: 4.2 g/dL (ref 3.8–4.8)
Alkaline Phosphatase: 66 IU/L (ref 39–117)
Bilirubin Total: 0.2 mg/dL (ref 0.0–1.2)
Bilirubin, Direct: 0.09 mg/dL (ref 0.00–0.40)
Total Protein: 7.1 g/dL (ref 6.0–8.5)

## 2018-11-25 NOTE — Progress Notes (Signed)
HPI: This is a very pleasant 68 year old woman who was referred to me by Felicia Lass, MD  to evaluate elevated liver tests, personal history of colon polyps.     Looks like her cardiologist noticed that her LFTs had increased and so he stopped both her fenofibrate and atorvastatin.  Most recently he restarted her atorvastatin but kept the fenofibrate on hold and after a month or so of that regimen her liver tests are proving to remain completely normal.  She had repeat LFTs checked today, that result is still pending  She has never been told that she had liver issues before.  Liver disease does not run in her family.  She is a nondrinker   She does have issues with alternating constipation and diarrhea she can go a week without having a bowel movement and then really have to push and strain to move her bowels.  She has tried MiraLAX at times.  She has tried fiber capsules at times.  She does not see any bleeding.  She brought with her a letter from the "[facility]" stating that she is due for repeat colonoscopy dated October 2020.  She says that she had polyps removed 5 or 6 years ago.  She thinks it was only one polyp.  Old Data Reviewed: Blood work September 2020 shows AST 119, ALT 138.  Blood work October 21, 2018 shows AST 28, ALT 37.   Blood work 11/04/2018 shows completely normal AST and ALT.  Hepatitis C antibody was negative.  CT scan abdomen pelvis October 2020 ordered by her gynecologist for abdominal pain, bloating, history of endometrial cancer showed possible hepatic steatosis with "mild prominence of the caudate lobe of the liver which can be an early indicator of cirrhosis, although no overt hepatic nodularity is observed"   Rest of her liver tests were all persistently normal.   Review of systems: Pertinent positive and negative review of systems were noted in the above HPI section. All other review negative.   Past Medical History:  Diagnosis Date  .  CAD (coronary artery disease)    a. 03/2015 s/p DES to the [facility]); b. 01/2017 MV: EF 80%, small, mild apical ant defect w/o ischemia (felt to be breast atten). Low risk.  . Depression   . DM (diabetes mellitus) ([place])   . Essential hypertension   . GERD (gastroesophageal reflux disease)   . High triglycerides   . Hyperlipidemia   . Hypothyroid   . OA (osteoarthritis)   . Overflow incontinence   . RLS (restless legs syndrome)   . Uterine cancer ([place]) dx'd 2014    Past Surgical History:  Procedure Laterality Date  . CORONARY STENT PLACEMENT    . CORONARY/GRAFT ACUTE MI REVASCULARIZATION    . FOOT SURGERY Left   . LEFT HEART CATH AND CORONARY ANGIOGRAPHY N/A 02/16/2018   Procedure: LEFT HEART CATH AND CORONARY ANGIOGRAPHY;  Surgeon: Lorretta Harp, MD;  Location: [facility];  Service: Cardiovascular;  Laterality: N/A;  . REPLACEMENT TOTAL KNEE Right   . SPINE SURGERY    . TOTAL ABDOMINAL HYSTERECTOMY     FOR UTERUS CANCER  . TOTAL HIP ARTHROPLASTY Left     Current Outpatient Medications  Medication Sig Dispense Refill  . Ascorbic Acid (VITAMIN C PO) Take 1 tablet by mouth daily.    Marland Kitchen aspirin EC 81 MG tablet Take 81 mg by mouth daily.    Marland Kitchen atorvastatin (LIPITOR) 40 MG tablet Take 1 tablet (  40 mg total) by mouth daily. 90 tablet 3  . bisoprolol (ZEBETA) 5 MG tablet Take 1 tablet (5 mg total) by mouth daily. 90 tablet 3  . Calcium Citrate-Vitamin D (CALCIUM + D PO) Take 1 tablet by mouth daily with supper.    . Calcium Polycarbophil (FIBERCON PO) Take 5 capsules by mouth daily.    . celecoxib (CELEBREX) 200 MG capsule Take 1 capsule by mouth 2 (two) times daily.    Marland Kitchen CINNAMON PO Take 1 capsule by mouth daily with supper.    . clotrimazole-betamethasone (LOTRISONE) cream Apply 1 application topically See admin instructions. APPLY A PEA-SIZED AMOUNT TWICE DAILY AS NEEDED/AS DIRECTED FOR 10 DAYS    . diclofenac sodium (VOLTAREN) 1 % GEL Apply 2-4 g topically 4 (four)  times daily as needed (as directed for pain).    . Dulaglutide (TRULICITY) 1.5 0000000 SOPN Inject 1.5 mg into the skin every Saturday.     Marland Kitchen GLIPIZIDE XL 10 MG 24 hr tablet Take 10 mg by mouth daily with breakfast.   3  . JARDIANCE 25 MG TABS tablet Take 25 mg by mouth every morning.   3  . levothyroxine (SYNTHROID, LEVOTHROID) 125 MCG tablet Take 125 mcg by mouth daily before breakfast.    . metFORMIN (GLUCOPHAGE) 500 MG tablet Take 2 tablets (1,000 mg total) by mouth See admin instructions. Take 1,000 mg by mouth 2 times a day- with breakfast and supper    . metroNIDAZOLE (METROGEL) 1 % gel Apply 1 application topically as needed (as directed to affected area).     . Misc Natural Products (OSTEO BI-FLEX ADV JOINT SHIELD PO) Take 1 tablet by mouth daily.    . Multiple Vitamins-Calcium (ONE-A-DAY WOMENS PO) Take 1 tablet by mouth daily.    . nitroGLYCERIN (NITROSTAT) 0.4 MG SL tablet Place 1 tablet (0.4 mg total) under the tongue every 5 (five) minutes as needed for chest pain. 25 tablet 6  . nortriptyline (PAMELOR) 25 MG capsule Take 25 mg by mouth at bedtime.   1  . omeprazole (PRILOSEC) 40 MG capsule Take 40 mg by mouth daily with supper.     Marland Kitchen rOPINIRole (REQUIP) 0.5 MG tablet Take 0.5 mg by mouth at bedtime. 1-3 hours prior to bedtime    . triamcinolone ointment (KENALOG) 0.1 % Apply 1 application topically 2 (two) times daily as needed (for itching).    . TURMERIC PO Take 1 capsule by mouth daily with supper.     No current facility-administered medications for this visit.     Allergies as of 11/25/2018 - Review Complete 10/29/2018  Allergen Reaction Noted  . Estrogens Other (See Comments) 02/14/2018  . Shrimp [shellfish allergy] Nausea And Vomiting 02/14/2018    Family History  Problem Relation Age of Onset  . AAA (abdominal aortic aneurysm) Mother   . Diabetes Father   . Breast cancer Cousin     Social History   Socioeconomic History  . Marital status: Divorced    Spouse  name: Not on file  . Number of children: Not on file  . Years of education: Not on file  . Highest education level: Not on file  Occupational History  . Not on file  Social Needs  . Financial resource strain: Not on file  . Food insecurity    Worry: Not on file    Inability: Not on file  . Transportation needs    Medical: Not on file    Non-medical: Not on file  Tobacco  Use  . Smoking status: Never Smoker  . Smokeless tobacco: Never Used  Substance and Sexual Activity  . Alcohol use: Yes    Comment: OCCASIONALLY  . Drug use: No  . Sexual activity: Not on file  Lifestyle  . Physical activity    Days per week: Not on file    Minutes per session: Not on file  . Stress: Not on file  Relationships  . Social Herbalist on phone: Not on file    Gets together: Not on file    Attends religious service: Not on file    Active member of club or organization: Not on file    Attends meetings of clubs or organizations: Not on file    Relationship status: Not on file  . Intimate partner violence    Fear of current or ex partner: Not on file    Emotionally abused: Not on file    Physically abused: Not on file    Forced sexual activity: Not on file  Other Topics Concern  . Not on file  Social History Narrative  . Not on file     Physical Exam: BP 124/66   Pulse 64   Temp (!) 97.4 F (36.3 C) (Temporal)   Ht 5\' 4"  (1.626 m)   Wt 175 lb (79.4 kg)   BMI 30.04 kg/m  Constitutional: generally well-appearing Psychiatric: alert and oriented x3 Eyes: extraocular movements intact Mouth: oral pharynx moist, no lesions Neck: supple no lymphadenopathy Cardiovascular: heart regular rate and rhythm Lungs: clear to auscultation bilaterally Abdomen: soft, nontender, nondistended, no obvious ascites, no peritoneal signs, normal bowel sounds Extremities: no lower extremity edema bilaterally Skin: no lesions on visible extremities   Assessment and plan: 68 y.o. female with  transient elevated liver tests, personal history of polyps, alternating constipation diarrhea  First her most recent liver testing which was about 3 weeks ago show completely normal liver tests.  I think her transient elevation was related to her cholesterol medicines.  Her cardiologist is making adjustments to her cholesterol medicine and a repeat set of liver testing was just ordered today.  She did have some steatosis based on CT scan and a larger than usual caudate liver however she has no clinical signs or symptoms of cirrhosis.  She might have some extra fat in her liver that shows up on CT scan but as long as her transaminases are normal I would certainly not say that she has Karlene Lineman.  Second she has alternating constipation and diarrhea recommend she try fiber supplements on a daily basis with Citrucel powder.  She had a polyp removed from the colon and hand sure we are sending way to get those records sent here for review.  After I see those records I will advise her on the timing of her next surveillance colonoscopy.   Please see the "Patient Instructions" section for addition details about the plan.   Owens Loffler, MD [facility] 11/25/2018, 3:07 PM  Cc: Felicia Lass, MD

## 2018-11-25 NOTE — Patient Instructions (Signed)
If you are age 69 or older, your body mass index should be between 23-30. Your Body mass index is 30.04 kg/m. If this is out of the aforementioned range listed, please consider follow up with your Primary Care Provider.  If you are age 8 or younger, your body mass index should be between 19-25. Your Body mass index is 30.04 kg/m. If this is out of the aformentioned range listed, please consider follow up with your Primary Care Provider.   Please purchase the following medications over the counter and take as directed:  1. Start Citrucel powder - start with small spoon full and work your way up to a giant spoon full over the next week.   Will wait on previous colonoscopy records and will advice once I review them.

## 2018-11-26 ENCOUNTER — Telehealth: Payer: Self-pay

## 2018-11-26 DIAGNOSIS — R7989 Other specified abnormal findings of blood chemistry: Secondary | ICD-10-CM

## 2018-11-26 NOTE — Telephone Encounter (Signed)
-----   Message from Milus Banister, MD sent at 11/26/2018  7:19 AM EST ----- Please let her know that her LFTs from yesterday (ordered by cardiology) were still much improved from 1-2 months ago but one of the values is still slightly elevated.  She needs repeat lfts in 2 months, if still slight elevation then will proceed with usual battery of labs (viral, AIH etc).  Thanks ----- Message ----- From: Milus Banister, MD Sent: 11/25/2018   4:29 PM EST To: Milus Banister, MD  Lfts from today??

## 2018-11-26 NOTE — Telephone Encounter (Signed)
Line rings then goes to busy will try later

## 2018-11-27 NOTE — Telephone Encounter (Signed)
The pt has been advised and lab order in Epic  

## 2018-11-27 NOTE — Telephone Encounter (Signed)
Left message on machine to call back  

## 2018-12-02 ENCOUNTER — Encounter: Payer: Medicare Other | Attending: Family Medicine | Admitting: Registered"

## 2018-12-02 ENCOUNTER — Other Ambulatory Visit: Payer: Self-pay

## 2018-12-02 ENCOUNTER — Encounter: Payer: Self-pay | Admitting: Registered"

## 2018-12-02 DIAGNOSIS — E119 Type 2 diabetes mellitus without complications: Secondary | ICD-10-CM | POA: Diagnosis present

## 2018-12-02 NOTE — Progress Notes (Signed)
Diabetes Self-Management Education  Visit Type: First/Initial  Appt. Start Time: 1405 Appt. End Time: [id number]  12/04/2018  [name], identified by name and date of birth, is a 68 y.o. female with a diagnosis of Diabetes: Type 2.   ASSESSMENT  There were no vitals taken for this visit. There is no height or weight on file to calculate BMI.  A1c 8.2% in March 2020; 6.8% in October 2020  SMBG: FBG 66-138 mg/dL; Pt states 66 mg/dL last week or the week before did not have hypoglycemic symptoms. Pt states she may check BG when feeling lousy, pt did not report any remarkable readings.  Med: currently metformin, Jardiance & glipizide. Patient states she was started on insulin early into her diagnosis but was able to stop lantus in 2010 d/t weight loss. Pt states she decided to stop all supplements except multivitamin and vitamin D & E due to her concern about cirrhosis diagnosis.  Diet: Pt states she works in a [employer] and snacks on meat, cheese, or tator tot type foods, likes the crispy crumbs from the rotisserie, does not have a structured meal during her 4-6 hr shift.   Patient has a limited variety of foods, eats mostly just English muffins for breakfast and dinner and eggs with dinner. Pt states she feels she has to have chocolate cupcakes or chocolate bar. Doesn't like spicy, or smoked food  GI: Pt states she goes back and forth from constipation to loose stools. RD did not see IBS in problem list in Epic and did not address these complaints this visit. Pt states d/t diverticulitis she avoids sesame seeds but other nuts don't seem to bother her.  Energy: sometimes feels tired all the time. Chewing on ice again, gave it up for awhile.  Sleep: Pt states sometimes she is awake for 24 hours, doesn't have a regular sleep pattern. Took a pain pill last night, woke up at 3:30 am awake a couple hours then fell back to sleep. Doesn't have a TV but watches shows on iPad.   Pt reports she is  tired 9-10 pm at night, doses off for a couple min while watching iPad, also has light on. Pt states she tries to take meds at 11 pm to have a consistent time. Pt also reports her oxygen level goes down when sleeps, "touch of sleep apnea." Pt states she used to have RSL that could disturb her sleep but not as bad d/t medicine. States she is supposed to take RSL med several hours before bed.  Physical activity: Pt states she had neck surgery ~2018? And has had balance issues every since. Pt states she has canes at her house in and in car which she uses on "bad days". Pt states she when she is sitting has to stand for a few seconds before she starts walking d/t impaired balance. Patient describes it as her "legs don't want to work." Pt states her cardiologist wants her to walk, but it doesn't feel safe to her and she does not have anyone to walk with her.  Diabetes Self-Management Education - 12/02/18 1434      Visit Information   Visit Type  First/Initial      Initial Visit   Diabetes Type  Type 2    Are you currently following a meal plan?  No    Are you taking your medications as prescribed?  Yes   Trulicity, jardiance, metformin, Glipizide   Date Diagnosed  1999  Health Coping   How would you rate your overall health?  Fair      Psychosocial Assessment   Patient Belief/Attitude about Diabetes  --   doesn't bother me, it is what it is   How often do you need to have someone help you when you read instructions, pamphlets, or other written materials from your doctor or pharmacy?  1 - Never    What is the last grade level you completed in school?  12      Complications   Last HgB A1C per patient/outside source  7.8 %   11/04/18   How often do you check your blood sugar?  1-2 times/day    Fasting Blood glucose range (mg/dL)  <70;70-129;130-179   66-132 mg/dL   Number of hypoglycemic episodes per month  1    Can you tell when your blood sugar is low?  No    Have you had a dilated eye  exam in the past 12 months?  Yes    Have you had a dental exam in the past 12 months?  No      Dietary Intake   Breakfast  English Muffin, cinnamon sugar    Snack (morning)  (at work) chicken OR Automotive engineer  none OR big tator tots, cheese & bacon    Snack (afternoon)  cheese sticks    Dinner  2 eggs, Engl muffin, ceddar sausage    Snack (evening)  strawberry sugar-free jelly on Engl. muffin    Beverage(s)  2 bottles water, decaf hot tea, sugar free coke, tonic zero sugar      Exercise   Exercise Type  ADL's      Patient Education   Previous Diabetes Education  Yes (please comment)    Nutrition management   Role of diet in the treatment of diabetes and the relationship between the three main macronutrients and blood glucose level    Medications  Reviewed patients medication for diabetes, action, purpose, timing of dose and side effects.      Individualized Goals (developed by patient)   Nutrition  Other (comment)   increase vegetable intake   Monitoring   test my blood glucose as discussed      Outcomes   Expected Outcomes  Demonstrated interest in learning. Expect positive outcomes    Future DMSE  4-6 wks    Program Status  Not Completed       Individualized Plan for Diabetes Self-Management Training:   Learning Objective:  Patient will have a greater understanding of diabetes self-management. Patient education plan is to attend individual and/or group sessions per assessed needs and concerns.   Plan:   Patient Instructions  Let doctor know that you have a craving to for chewing on ice. It could be because your iron might be low. There is an iron supplement in your med list 240 mg, (27 Fe), you may want to start taking it again.   Consider talking to your doctor about the Glipizide to see if this is something your could discontinue due to your potential for low blood sugar (unawareness).  Consider checking your blood sugar about 90 min after breakfast. [employer] is  to be less than 180.  Consider including more vegetables in your diet. Consider more beans, legumes. Carrots and Humus would be a great snack. May help with your restless legs.  Consider Sleep tips: Start with #2 Sleep when Sleepy - getting in a routine of going to sleep at  9-10 pm; #6 Bed is for sleeping.   Expected Outcomes:  Demonstrated interest in learning. Expect positive outcomes  Education material provided: A1c chart, sleep hygiene  If problems or questions, patient to contact team via:  Phone  Future DSME appointment: 4-6 wks

## 2018-12-02 NOTE — Patient Instructions (Addendum)
Let doctor know that you have a craving to for chewing on ice. It could be because your iron might be low. There is an iron supplement in your med list 240 mg, (27 Fe), you may want to start taking it again.   Consider talking to your doctor about the Glipizide to see if this is something your could discontinue due to your potential for low blood sugar (unawareness).  Consider checking your blood sugar about 90 min after breakfast. [employer] is to be less than 180.  Consider including more vegetables in your diet. Consider more beans, legumes. Carrots and Humus would be a great snack. May help with your restless legs.  Consider Sleep tips: Start with #2 Sleep when Sleepy - getting in a routine of going to sleep at 9-10 pm; #6 Bed is for sleeping.

## 2018-12-07 ENCOUNTER — Telehealth: Payer: Self-pay | Admitting: Gastroenterology

## 2018-12-07 NOTE — Telephone Encounter (Signed)
Colonoscopy for routine risk screening, September 2015 in [facility] [address] [place].  Findings 4 mm hepatic flexure polyp.  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  Please let her know that I reviewed her [place] colonoscopy report from 2015 and she needs repeat surveillance colonoscopy September 2022, 7 years from her last one.

## 2018-12-07 NOTE — Telephone Encounter (Signed)
Recall in Epic pt aware

## 2019-01-13 ENCOUNTER — Ambulatory Visit: Payer: Medicare Other | Admitting: Registered"

## 2019-01-22 ENCOUNTER — Ambulatory Visit: Payer: Medicare Other | Admitting: Neurology

## 2019-05-05 DIAGNOSIS — M65321 Trigger finger, right index finger: Secondary | ICD-10-CM | POA: Insufficient documentation

## 2019-05-05 DIAGNOSIS — M19041 Primary osteoarthritis, right hand: Secondary | ICD-10-CM | POA: Insufficient documentation

## 2019-05-05 DIAGNOSIS — M1811 Unilateral primary osteoarthritis of first carpometacarpal joint, right hand: Secondary | ICD-10-CM | POA: Insufficient documentation

## 2019-05-06 ENCOUNTER — Other Ambulatory Visit: Payer: Self-pay | Admitting: Orthopedic Surgery

## 2019-05-07 ENCOUNTER — Other Ambulatory Visit: Payer: Self-pay

## 2019-05-07 ENCOUNTER — Encounter: Payer: Self-pay | Admitting: Cardiovascular Disease

## 2019-05-07 ENCOUNTER — Encounter (INDEPENDENT_AMBULATORY_CARE_PROVIDER_SITE_OTHER): Payer: Self-pay

## 2019-05-07 ENCOUNTER — Ambulatory Visit: Payer: Medicare Other | Admitting: Cardiovascular Disease

## 2019-05-07 VITALS — BP 112/68 | HR 68 | Ht 64.0 in | Wt 172.5 lb

## 2019-05-07 DIAGNOSIS — E781 Pure hyperglyceridemia: Secondary | ICD-10-CM

## 2019-05-07 DIAGNOSIS — E785 Hyperlipidemia, unspecified: Secondary | ICD-10-CM

## 2019-05-07 DIAGNOSIS — R748 Abnormal levels of other serum enzymes: Secondary | ICD-10-CM | POA: Diagnosis not present

## 2019-05-07 DIAGNOSIS — I2510 Atherosclerotic heart disease of native coronary artery without angina pectoris: Secondary | ICD-10-CM

## 2019-05-07 DIAGNOSIS — E119 Type 2 diabetes mellitus without complications: Secondary | ICD-10-CM

## 2019-05-07 NOTE — Patient Instructions (Signed)
Medication Instructions:  Your physician recommends that you continue on your current medications as directed. Please refer to the Current Medication list given to you today.  *If you need a refill on your cardiac medications before your next appointment, please call your pharmacy*   Lab Work: None Ordered If you have labs (blood work) drawn today and your tests are completely normal, you will receive your results only by: . MyChart Message (if you have MyChart) OR . A paper copy in the mail If you have any lab test that is abnormal or we need to change your treatment, we will call you to review the results.   Testing/Procedures: None Ordered   Follow-Up: At [facility], you and your health needs are our priority.  As part of our continuing mission to provide you with exceptional heart care, we have created designated Provider Care Teams.  These Care Teams include your primary Cardiologist (physician) and Advanced Practice Providers (APPs -  Physician Assistants and Nurse Practitioners) who all work together to provide you with the care you need, when you need it.  We recommend signing up for the patient portal called "MyChart".  Sign up information is provided on this After Visit Summary.  MyChart is used to connect with patients for Virtual Visits (Telemedicine).  Patients are able to view lab/test results, encounter notes, upcoming appointments, etc.  Non-urgent messages can be sent to your provider as well.   To learn more about what you can do with MyChart, go to [email or web address].    Your next appointment:   1 year(s)  The format for your next appointment:   In Person  Provider:   You may see Philip Nahser, MD or one of the following Advanced Practice Providers on your designated Care Team:    Scott Weaver, PA-C  Vin Bhagat, PA-C  Janine Hammond, NP     

## 2019-05-07 NOTE — Progress Notes (Signed)
Cardiology Office Note:    Date:  05/07/2019   ID:  Felicia [name], DOB [birth year], MRN [id number]  PCP:  Kathyrn Lass, MD  Cardiologist:  Mertie Moores, MD    Referring MD: Kathyrn Lass, MD   Chief Complaint  Patient presents with  . Coronary Artery Disease   Problem list 1.  Coronary artery disease - March, 2017,  Resolute Integrity 2.25 x 18 mm stent - mid LAD  2.  Diabetes mellitus 3.  Hypothyroidism 4.  Hyperlipidemia     Felicia [name] is a 69 y.o. female with a hx of coronary artery disease, diabetes mellitus, and hypothyroidism.  She recently moved from [place].  She is here to establish cardiology care.  Has had some unusual chest pressure .   Not exactily like her previous discomfort prior stenting . Had a stress echo which suggested  CAD.    Has chest pressure -   Band like sensation,  Does not exercise She does not think it worsens with exertion. ,  Does not radiate, not associated with increased dyspnea.  Not associated with presyncope .  The sensation comes and goes.   The pain has been present for the past 3 months .   Is on Bisoprilol 5 mg a day  Tried Metoprolol but this caused lots of fatigue and sleepyness.   May 06, 2017: Our were seen today for follow-up of her coronary artery disease.  She is status post stenting of her mid LAD March 2017. Is a history of hyperlipidemia. No angina, Exercising in the pool regularly  Has lots of arthritis .  Suggested a stationary bike or elliptical bike   Sept. 2020  Has been working - [employer] in [employer]  Not much cardiiac exercise  No CP or dyspnea  We discussed her elevated Triglyceride levels and discussed Vascepa - she does not think she could afford it   May 07, 2019:  Felicia [name] is seen today for follow-up of her coronary artery disease.  She is status post stenting of her mid LAD in March, 2017.  She has a history of hyperlipidemia. A little bit of exercise.   Does PT  Still working - the  [employer] at [place] to have right trigger finger surgery  [name], MD)  She is at low risk for this finger surgery  She may hold her ASA for 5-7 days prior to surgery if needed for her surgery   Labs were drawn 2 days ago  Goal LDL is < 70  Her trigs have been elevated.   Goal trig level is 150     Past Medical History:  Diagnosis Date  . CAD (coronary artery disease)    a. 03/2015 s/p DES to the LAD [facility]); b. 01/2017 MV: EF 80%, small, mild apical ant defect w/o ischemia (felt to be breast atten). Low risk.  . Depression   . DM (diabetes mellitus) ([place])   . Essential hypertension   . GERD (gastroesophageal reflux disease)   . High triglycerides   . Hyperlipidemia   . Hypothyroid   . OA (osteoarthritis)   . Overflow incontinence   . RLS (restless legs syndrome)   . Uterine cancer ([place]) dx'd 2014    Past Surgical History:  Procedure Laterality Date  . CORONARY STENT PLACEMENT    . CORONARY/GRAFT ACUTE MI REVASCULARIZATION    . FOOT SURGERY Left   . LEFT HEART CATH AND CORONARY ANGIOGRAPHY N/A 02/16/2018  Procedure: LEFT HEART CATH AND CORONARY ANGIOGRAPHY;  Surgeon: Lorretta Harp, MD;  Location: [facility];  Service: Cardiovascular;  Laterality: N/A;  . REPLACEMENT TOTAL KNEE Right   . SPINE SURGERY    . TOTAL ABDOMINAL HYSTERECTOMY     FOR UTERUS CANCER  . TOTAL HIP ARTHROPLASTY Left     Current Medications: Current Meds  Medication Sig  . ACCU-CHEK GUIDE test strip 1 each by Other route as needed.  . Ascorbic Acid (VITAMIN C PO) Take 1 tablet by mouth daily.  Marland Kitchen aspirin EC 81 MG tablet Take 81 mg by mouth daily.  Marland Kitchen atorvastatin (LIPITOR) 40 MG tablet Take 1 tablet (40 mg total) by mouth daily.  . bisoprolol (ZEBETA) 5 MG tablet Take 1 tablet (5 mg total) by mouth daily.  . Calcium Citrate-Vitamin D (CALCIUM + D PO) Take 1 tablet by mouth daily with supper.  . Calcium Polycarbophil (FIBERCON PO) Take 5 capsules by mouth daily.  .  celecoxib (CELEBREX) 200 MG capsule Take 1 capsule by mouth 2 (two) times daily.  Marland Kitchen CINNAMON PO Take 1 capsule by mouth daily with supper.  . clotrimazole-betamethasone (LOTRISONE) cream Apply 1 application topically See admin instructions. APPLY A PEA-SIZED AMOUNT TWICE DAILY AS NEEDED/AS DIRECTED FOR 10 DAYS  . diclofenac sodium (VOLTAREN) 1 % GEL Apply 2-4 g topically 4 (four) times daily as needed (as directed for pain).  . Dulaglutide (TRULICITY) 1.5 0000000 SOPN Inject 1.5 mg into the skin every Saturday.   . Ferrous Sulfate (IRON PO) Take 1 tablet by mouth daily. alternates days:1 tablet one day and 2 tablets the next day  . GLIPIZIDE XL 10 MG 24 hr tablet Take 10 mg by mouth daily with breakfast.   . JARDIANCE 25 MG TABS tablet Take 25 mg by mouth every morning.   Marland Kitchen levothyroxine (SYNTHROID, LEVOTHROID) 125 MCG tablet Take 125 mcg by mouth daily before breakfast.  . metFORMIN (GLUCOPHAGE) 500 MG tablet Take 2 tablets (1,000 mg total) by mouth See admin instructions. Take 1,000 mg by mouth 2 times a day- with breakfast and supper  . metroNIDAZOLE (METROGEL) 1 % gel Apply 1 application topically as needed (as directed to affected area).   . Misc Natural Products (OSTEO BI-FLEX ADV JOINT SHIELD PO) Take 1 tablet by mouth daily.  . Multiple Vitamins-Calcium (ONE-A-DAY WOMENS PO) Take 1 tablet by mouth daily.  . nitroGLYCERIN (NITROSTAT) 0.4 MG SL tablet Place 1 tablet (0.4 mg total) under the tongue every 5 (five) minutes as needed for chest pain.  . nortriptyline (PAMELOR) 25 MG capsule Take 25 mg by mouth at bedtime.   Marland Kitchen omeprazole (PRILOSEC) 40 MG capsule Take 40 mg by mouth daily with supper.   Marland Kitchen rOPINIRole (REQUIP) 0.5 MG tablet Take 0.5 mg by mouth at bedtime. 1-3 hours prior to bedtime  . triamcinolone ointment (KENALOG) 0.1 % Apply 1 application topically 2 (two) times daily as needed (for itching).  . TURMERIC PO Take 1 capsule by mouth daily with supper.     Allergies:   Estrogens  and Shrimp [shellfish allergy]   Social History   Socioeconomic History  . Marital status: Divorced    Spouse name: Not on file  . Number of children: Not on file  . Years of education: Not on file  . Highest education level: Not on file  Occupational History  . Not on file  Tobacco Use  . Smoking status: Never Smoker  . Smokeless tobacco: Never Used  Substance  and Sexual Activity  . Alcohol use: Yes    Comment: OCCASIONALLY  . Drug use: No  . Sexual activity: Not on file  Other Topics Concern  . Not on file  Social History Narrative  . Not on file   Social Determinants of Health   Financial Resource Strain:   . Difficulty of Paying Living Expenses:   Food Insecurity:   . Worried About Charity fundraiser in the Last Year:   . Arboriculturist in the Last Year:   Transportation Needs:   . Film/video editor (Medical):   Marland Kitchen Lack of Transportation (Non-Medical):   Physical Activity:   . Days of Exercise per Week:   . Minutes of Exercise per Session:   Stress:   . Feeling of Stress :   Social Connections:   . Frequency of Communication with Friends and Family:   . Frequency of Social Gatherings with Friends and Family:   . Attends Religious Services:   . Active Member of Clubs or Organizations:   . Attends Archivist Meetings:   Marland Kitchen Marital Status:      Family History: The patient's family history includes AAA (abdominal aortic aneurysm) in her mother; Breast cancer in her cousin; Diabetes in her father. ROS:   Please see the history of present illness.     All other systems reviewed and are negative.   Physical Exam: Blood pressure 112/68, pulse 68, height 5\' 4"  (1.626 m), weight 172 lb 8 oz (78.2 kg), SpO2 99 %.  GEN:  Well nourished, well developed in no acute distress HEENT: Normal NECK: No JVD; No carotid bruits LYMPHATICS: No lymphadenopathy CARDIAC: RRR, no murmurs, rubs, gallops RESPIRATORY:  Clear to auscultation without rales, wheezing  or rhonchi  ABDOMEN: Soft, non-tender, non-distended MUSCULOSKELETAL:  No edema; No deformity  SKIN: Warm and dry NEUROLOGIC:  Alert and oriented x 3   Recent Labs: 11/25/2018: ALT 36; BUN 18; Creatinine, Ser 0.74; Potassium 4.6; Sodium 139  Recent Lipid Panel    Component Value Date/Time   CHOL 165 11/25/2018 1053   TRIG 306 (H) 11/25/2018 1053   HDL 45 11/25/2018 1053   CHOLHDL 3.7 11/25/2018 1053   LDLCALC 71 11/25/2018 1053      EKGs/Labs/Other Studies Reviewed:    EKG:       ASSESSMENT:    1. Hypertriglyceridemia   2. Elevated liver enzymes   3. Hyperlipidemia LDL goal <70   4. Coronary artery disease involving native coronary artery of native heart without angina pectoris   5. Type 2 diabetes mellitus without complication, without long-term current use of insulin ([place])    PLAN:      1.   Coronary artery disease: Cayleen is doing well.  She is not had any episodes of chest pain or shortness of breath.  She is at low risk for her upcoming finger surgery.  She may hold her aspirin for 5 to 7 days prior to surgery.  2.  Hyperlipidemia:   She still eats quite a bit of processed meats.  She loves bologna.  I have cautioned her about eating processed meats and encouraged her to eat more lean chicken or fish or perhaps [place].  She had labs drawn at her primary medical doctor's office.  We will try to get them to fax [place] over a copy of the labs.  I encouraged her to start exercising.  I will have her see my APP in 1 year.   Increase diet  and exercise   Medication Adjustments/Labs and Tests Ordered: Current medicines are reviewed at length with the patient today.  Concerns regarding medicines are outlined above.  Orders Placed This Encounter  Procedures  . EKG 12-Lead   No orders of the defined types were placed in this encounter.   Signed, Mertie Moores, MD  05/07/2019 2:25 PM    [place]

## 2019-06-04 ENCOUNTER — Other Ambulatory Visit: Payer: Self-pay

## 2019-06-04 ENCOUNTER — Encounter (HOSPITAL_BASED_OUTPATIENT_CLINIC_OR_DEPARTMENT_OTHER): Payer: Self-pay | Admitting: Orthopedic Surgery

## 2019-06-08 ENCOUNTER — Other Ambulatory Visit (HOSPITAL_COMMUNITY)
Admission: RE | Admit: 2019-06-08 | Discharge: 2019-06-08 | Disposition: A | Discharge status: Home / Self Care | Payer: Medicare Other | Source: Ambulatory Visit | Attending: Orthopedic Surgery | Admitting: Orthopedic Surgery

## 2019-06-08 ENCOUNTER — Encounter (HOSPITAL_BASED_OUTPATIENT_CLINIC_OR_DEPARTMENT_OTHER)
Admission: RE | Admit: 2019-06-08 | Discharge: 2019-06-08 | Disposition: A | Discharge status: Home / Self Care | Payer: Medicare Other | Source: Ambulatory Visit | Attending: Orthopedic Surgery | Admitting: Orthopedic Surgery

## 2019-06-08 DIAGNOSIS — E039 Hypothyroidism, unspecified: Secondary | ICD-10-CM | POA: Diagnosis not present

## 2019-06-08 DIAGNOSIS — F329 Major depressive disorder, single episode, unspecified: Secondary | ICD-10-CM | POA: Diagnosis not present

## 2019-06-08 DIAGNOSIS — M25741 Osteophyte, right hand: Secondary | ICD-10-CM | POA: Diagnosis not present

## 2019-06-08 DIAGNOSIS — Z7982 Long term (current) use of aspirin: Secondary | ICD-10-CM | POA: Diagnosis not present

## 2019-06-08 DIAGNOSIS — Z791 Long term (current) use of non-steroidal anti-inflammatories (NSAID): Secondary | ICD-10-CM | POA: Diagnosis not present

## 2019-06-08 DIAGNOSIS — I10 Essential (primary) hypertension: Secondary | ICD-10-CM | POA: Diagnosis not present

## 2019-06-08 DIAGNOSIS — Z8542 Personal history of malignant neoplasm of other parts of uterus: Secondary | ICD-10-CM | POA: Diagnosis not present

## 2019-06-08 DIAGNOSIS — K219 Gastro-esophageal reflux disease without esophagitis: Secondary | ICD-10-CM | POA: Diagnosis not present

## 2019-06-08 DIAGNOSIS — I2510 Atherosclerotic heart disease of native coronary artery without angina pectoris: Secondary | ICD-10-CM | POA: Diagnosis not present

## 2019-06-08 DIAGNOSIS — G2581 Restless legs syndrome: Secondary | ICD-10-CM | POA: Diagnosis not present

## 2019-06-08 DIAGNOSIS — Z79899 Other long term (current) drug therapy: Secondary | ICD-10-CM | POA: Diagnosis not present

## 2019-06-08 DIAGNOSIS — Z20822 Contact with and (suspected) exposure to covid-19: Secondary | ICD-10-CM | POA: Insufficient documentation

## 2019-06-08 DIAGNOSIS — Z833 Family history of diabetes mellitus: Secondary | ICD-10-CM | POA: Diagnosis not present

## 2019-06-08 DIAGNOSIS — Z955 Presence of coronary angioplasty implant and graft: Secondary | ICD-10-CM | POA: Diagnosis not present

## 2019-06-08 DIAGNOSIS — M19041 Primary osteoarthritis, right hand: Secondary | ICD-10-CM | POA: Diagnosis not present

## 2019-06-08 DIAGNOSIS — M65321 Trigger finger, right index finger: Secondary | ICD-10-CM | POA: Diagnosis not present

## 2019-06-08 DIAGNOSIS — Z01812 Encounter for preprocedural laboratory examination: Secondary | ICD-10-CM | POA: Insufficient documentation

## 2019-06-08 DIAGNOSIS — E785 Hyperlipidemia, unspecified: Secondary | ICD-10-CM | POA: Diagnosis not present

## 2019-06-08 DIAGNOSIS — E119 Type 2 diabetes mellitus without complications: Secondary | ICD-10-CM | POA: Diagnosis not present

## 2019-06-08 DIAGNOSIS — Z7984 Long term (current) use of oral hypoglycemic drugs: Secondary | ICD-10-CM | POA: Diagnosis not present

## 2019-06-08 LAB — BASIC METABOLIC PANEL
Anion gap: 11 (ref 5–15)
BUN: 11 mg/dL (ref 8–23)
CO2: 24 mmol/L (ref 22–32)
Calcium: 9.8 mg/dL (ref 8.9–10.3)
Chloride: 102 mmol/L (ref 98–111)
Creatinine, Ser: 0.7 mg/dL (ref 0.44–1.00)
GFR calc Af Amer: >60 mL/min (ref >60)
GFR calc non Af Amer: >60 mL/min (ref >60)
Glucose, Bld: 250 mg/dL — ABNORMAL HIGH (ref 70–99)
Potassium: 4.4 mmol/L (ref 3.5–5.1)
Sodium: 137 mmol/L (ref 135–145)

## 2019-06-08 LAB — SARS CORONAVIRUS 2 (TAT 6-24 HRS): SARS Coronavirus 2: NEGATIVE

## 2019-06-08 NOTE — Progress Notes (Signed)

## 2019-06-11 ENCOUNTER — Encounter (HOSPITAL_BASED_OUTPATIENT_CLINIC_OR_DEPARTMENT_OTHER)
Admission: RE | Disposition: A | Discharge status: Home / Self Care | Payer: Self-pay | Source: Home / Self Care | Attending: Orthopedic Surgery

## 2019-06-11 ENCOUNTER — Other Ambulatory Visit: Payer: Self-pay

## 2019-06-11 ENCOUNTER — Ambulatory Visit (HOSPITAL_BASED_OUTPATIENT_CLINIC_OR_DEPARTMENT_OTHER): Payer: Medicare Other | Admitting: Anesthesiology

## 2019-06-11 ENCOUNTER — Encounter (HOSPITAL_BASED_OUTPATIENT_CLINIC_OR_DEPARTMENT_OTHER): Payer: Self-pay | Admitting: Orthopedic Surgery

## 2019-06-11 ENCOUNTER — Ambulatory Visit (HOSPITAL_BASED_OUTPATIENT_CLINIC_OR_DEPARTMENT_OTHER)
Admission: RE | Admit: 2019-06-11 | Discharge: 2019-06-11 | Disposition: A | Discharge status: Home / Self Care | Payer: Medicare Other | Attending: Orthopedic Surgery | Admitting: Orthopedic Surgery

## 2019-06-11 DIAGNOSIS — Z7982 Long term (current) use of aspirin: Secondary | ICD-10-CM | POA: Insufficient documentation

## 2019-06-11 DIAGNOSIS — E039 Hypothyroidism, unspecified: Secondary | ICD-10-CM | POA: Insufficient documentation

## 2019-06-11 DIAGNOSIS — G2581 Restless legs syndrome: Secondary | ICD-10-CM | POA: Insufficient documentation

## 2019-06-11 DIAGNOSIS — M25741 Osteophyte, right hand: Secondary | ICD-10-CM | POA: Insufficient documentation

## 2019-06-11 DIAGNOSIS — M65321 Trigger finger, right index finger: Secondary | ICD-10-CM | POA: Insufficient documentation

## 2019-06-11 DIAGNOSIS — E785 Hyperlipidemia, unspecified: Secondary | ICD-10-CM | POA: Insufficient documentation

## 2019-06-11 DIAGNOSIS — Z79899 Other long term (current) drug therapy: Secondary | ICD-10-CM | POA: Insufficient documentation

## 2019-06-11 DIAGNOSIS — K219 Gastro-esophageal reflux disease without esophagitis: Secondary | ICD-10-CM | POA: Insufficient documentation

## 2019-06-11 DIAGNOSIS — Z955 Presence of coronary angioplasty implant and graft: Secondary | ICD-10-CM | POA: Insufficient documentation

## 2019-06-11 DIAGNOSIS — Z8542 Personal history of malignant neoplasm of other parts of uterus: Secondary | ICD-10-CM | POA: Insufficient documentation

## 2019-06-11 DIAGNOSIS — I2510 Atherosclerotic heart disease of native coronary artery without angina pectoris: Secondary | ICD-10-CM | POA: Insufficient documentation

## 2019-06-11 DIAGNOSIS — F329 Major depressive disorder, single episode, unspecified: Secondary | ICD-10-CM | POA: Insufficient documentation

## 2019-06-11 DIAGNOSIS — Z7984 Long term (current) use of oral hypoglycemic drugs: Secondary | ICD-10-CM | POA: Insufficient documentation

## 2019-06-11 DIAGNOSIS — Z791 Long term (current) use of non-steroidal anti-inflammatories (NSAID): Secondary | ICD-10-CM | POA: Insufficient documentation

## 2019-06-11 DIAGNOSIS — Z833 Family history of diabetes mellitus: Secondary | ICD-10-CM | POA: Insufficient documentation

## 2019-06-11 DIAGNOSIS — M19041 Primary osteoarthritis, right hand: Secondary | ICD-10-CM | POA: Insufficient documentation

## 2019-06-11 DIAGNOSIS — I10 Essential (primary) hypertension: Secondary | ICD-10-CM | POA: Insufficient documentation

## 2019-06-11 DIAGNOSIS — E119 Type 2 diabetes mellitus without complications: Secondary | ICD-10-CM | POA: Insufficient documentation

## 2019-06-11 HISTORY — DX: Other specified postprocedural states: Z98.890

## 2019-06-11 HISTORY — DX: Other specified postprocedural states: R11.2

## 2019-06-11 HISTORY — PX: FINGER ARTHRODESIS: SHX5000

## 2019-06-11 HISTORY — DX: Nausea with vomiting, unspecified: R11.2

## 2019-06-11 LAB — GLUCOSE, CAPILLARY
Glucose-Capillary: 151 mg/dL — ABNORMAL HIGH (ref 70–99)
Glucose-Capillary: 129 mg/dL — ABNORMAL HIGH (ref 70–99)

## 2019-06-11 SURGERY — FUSION, JOINT, FINGER
Anesthesia: General | Site: Finger | Laterality: Right

## 2019-06-11 MED ORDER — FENTANYL CITRATE (PF) 100 MCG/2ML IJ SOLN
INTRAMUSCULAR | Status: DC | PRN
  Administered 2019-06-11 (×3): 50 ug via INTRAVENOUS

## 2019-06-11 MED ORDER — PROPOFOL 10 MG/ML IV BOLUS
INTRAVENOUS | Status: DC | PRN
  Administered 2019-06-11: 50 mg via INTRAVENOUS
  Administered 2019-06-11: 150 mg via INTRAVENOUS

## 2019-06-11 MED ORDER — PHENYLEPHRINE 40 MCG/ML (10ML) SYRINGE FOR IV PUSH (FOR BLOOD PRESSURE SUPPORT)
PREFILLED_SYRINGE | INTRAVENOUS | Status: DC | PRN
  Administered 2019-06-11: 80 ug via INTRAVENOUS
  Administered 2019-06-11 (×2): 40 ug via INTRAVENOUS
  Administered 2019-06-11: 80 ug via INTRAVENOUS

## 2019-06-11 MED ORDER — ORAL CARE MOUTH RINSE
15.0000 mL | Freq: Once | OROMUCOSAL | Status: DC
Start: 2019-06-11 — End: 2019-06-11

## 2019-06-11 MED ORDER — FENTANYL CITRATE (PF) 100 MCG/2ML IJ SOLN
INTRAMUSCULAR | Status: AC
  Filled 2019-06-11: qty 2

## 2019-06-11 MED ORDER — BETAMETHASONE SOD PHOS & ACET 6 (3-3) MG/ML IJ SUSP
INTRAMUSCULAR | Status: AC
  Filled 2019-06-11: qty 5

## 2019-06-11 MED ORDER — LIDOCAINE HCL (PF) 1 % IJ SOLN
INTRAMUSCULAR | Status: DC | PRN
  Administered 2019-06-11: 1 mL

## 2019-06-11 MED ORDER — LIDOCAINE HCL (PF) 1 % IJ SOLN
INTRAMUSCULAR | Status: AC
  Filled 2019-06-11: qty 5

## 2019-06-11 MED ORDER — HYDROCODONE-ACETAMINOPHEN 5-325 MG PO TABS: ORAL_TABLET | ORAL | 0 refills | Status: DC

## 2019-06-11 MED ORDER — DEXAMETHASONE SODIUM PHOSPHATE 10 MG/ML IJ SOLN
INTRAMUSCULAR | Status: DC | PRN
  Administered 2019-06-11: 4 mg via INTRAVENOUS

## 2019-06-11 MED ORDER — ONDANSETRON HCL 4 MG/2ML IJ SOLN
INTRAMUSCULAR | Status: DC | PRN
  Administered 2019-06-11: 4 mg via INTRAVENOUS

## 2019-06-11 MED ORDER — TRIAMCINOLONE ACETONIDE 40 MG/ML IJ SUSP
INTRAMUSCULAR | Status: AC
  Filled 2019-06-11: qty 5

## 2019-06-11 MED ORDER — ONDANSETRON HCL 4 MG/2ML IJ SOLN: 4.0000 mg | Freq: Once | INTRAMUSCULAR | Status: DC | PRN

## 2019-06-11 MED ORDER — CHLORHEXIDINE GLUCONATE 0.12 % MT SOLN
15.0000 mL | Freq: Once | OROMUCOSAL | Status: DC
Start: 2019-06-11 — End: 2019-06-11

## 2019-06-11 MED ORDER — FENTANYL CITRATE (PF) 100 MCG/2ML IJ SOLN: 50.0000 ug | INTRAMUSCULAR | Status: DC | PRN

## 2019-06-11 MED ORDER — CEFAZOLIN SODIUM-DEXTROSE 2-4 GM/100ML-% IV SOLN
2.0000 g | INTRAVENOUS | Status: AC
  Administered 2019-06-11: 2 g via INTRAVENOUS

## 2019-06-11 MED ORDER — ACETAMINOPHEN 500 MG PO TABS
1000.0000 mg | ORAL_TABLET | Freq: Once | ORAL | Status: AC
  Administered 2019-06-11: 1000 mg via ORAL

## 2019-06-11 MED ORDER — CEFAZOLIN SODIUM-DEXTROSE 2-4 GM/100ML-% IV SOLN
INTRAVENOUS | Status: AC
  Filled 2019-06-11: qty 100

## 2019-06-11 MED ORDER — PROPOFOL 10 MG/ML IV BOLUS
INTRAVENOUS | Status: AC
  Filled 2019-06-11: qty 20

## 2019-06-11 MED ORDER — BETAMETHASONE SOD PHOS & ACET 6 (3-3) MG/ML IJ SUSP
INTRAMUSCULAR | Status: DC | PRN
  Administered 2019-06-11: 6 mg

## 2019-06-11 MED ORDER — FENTANYL CITRATE (PF) 100 MCG/2ML IJ SOLN: 25.0000 ug | INTRAMUSCULAR | Status: DC | PRN

## 2019-06-11 MED ORDER — MIDAZOLAM HCL 2 MG/2ML IJ SOLN: 1.0000 mg | INTRAMUSCULAR | Status: DC | PRN

## 2019-06-11 MED ORDER — BUPIVACAINE HCL (PF) 0.25 % IJ SOLN
INTRAMUSCULAR | Status: DC | PRN
  Administered 2019-06-11: 10 mL

## 2019-06-11 MED ORDER — ACETAMINOPHEN 500 MG PO TABS
ORAL_TABLET | ORAL | Status: AC
  Filled 2019-06-11: qty 2

## 2019-06-11 MED ORDER — LACTATED RINGERS IV SOLN: INTRAVENOUS | Status: DC

## 2019-06-11 SURGICAL SUPPLY — 54 items
BIT DRILL CANN LONG QR 1.7 (DRILL) ×1 IMPLANT
BLADE MINI RND TIP GREEN BEAV (BLADE) ×2 IMPLANT
BLADE SURG 15 STRL LF DISP TIS (BLADE) ×2 IMPLANT
BLADE SURG 15 STRL SS (BLADE) ×2
BNDG COHESIVE 1X5 TAN NS LF (GAUZE/BANDAGES/DRESSINGS) ×2 IMPLANT
BNDG ELASTIC 2X5.8 VLCR STR LF (GAUZE/BANDAGES/DRESSINGS) IMPLANT
BNDG ELASTIC 3X5.8 VLCR STR LF (GAUZE/BANDAGES/DRESSINGS) IMPLANT
BNDG ESMARK 4X9 LF (GAUZE/BANDAGES/DRESSINGS) ×2 IMPLANT
BNDG GAUZE ELAST 4 BULKY (GAUZE/BANDAGES/DRESSINGS) IMPLANT
CHLORAPREP W/TINT 26 (MISCELLANEOUS) ×2 IMPLANT
CORD BIPOLAR FORCEPS 12FT (ELECTRODE) ×2 IMPLANT
COVER BACK TABLE 60X90IN (DRAPES) ×2 IMPLANT
COVER MAYO STAND STRL (DRAPES) ×2 IMPLANT
COVER WAND RF STERILE (DRAPES) IMPLANT
CUFF TOURN SGL QUICK 18X4 (TOURNIQUET CUFF) ×2 IMPLANT
DRAPE EXTREMITY T 121X128X90 (DISPOSABLE) ×2 IMPLANT
DRAPE OEC MINIVIEW 54X84 (DRAPES) ×2 IMPLANT
DRAPE SURG 17X23 STRL (DRAPES) ×2 IMPLANT
DRILL LONG 1.7 (DRILL) ×2
GAUZE SPONGE 4X4 12PLY STRL (GAUZE/BANDAGES/DRESSINGS) ×2 IMPLANT
GAUZE XEROFORM 1X8 LF (GAUZE/BANDAGES/DRESSINGS) ×2 IMPLANT
GLOVE BIO SURGEON STRL SZ7.5 (GLOVE) ×2 IMPLANT
GLOVE BIOGEL PI IND STRL 8 (GLOVE) ×1 IMPLANT
GLOVE BIOGEL PI IND STRL 8.5 (GLOVE) ×1 IMPLANT
GLOVE BIOGEL PI INDICATOR 8 (GLOVE) ×1
GLOVE BIOGEL PI INDICATOR 8.5 (GLOVE) ×1
GLOVE SURG ORTHO 8.0 STRL STRW (GLOVE) IMPLANT
GLOVE SURG SS PI 8.5 STRL IVOR (GLOVE)
GLOVE SURG SS PI 8.5 STRL STRW (GLOVE) IMPLANT
GOWN STRL REUS W/ TWL LRG LVL3 (GOWN DISPOSABLE) ×1 IMPLANT
GOWN STRL REUS W/TWL LRG LVL3 (GOWN DISPOSABLE) ×1
GOWN STRL REUS W/TWL XL LVL3 (GOWN DISPOSABLE) ×2 IMPLANT
K-WIRE 0.35X4 (WIRE) ×2
KWIRE 0.35X4 (WIRE) ×1 IMPLANT
NS IRRIG 1000ML POUR BTL (IV SOLUTION) ×2 IMPLANT
PAD CAST 3X4 CTTN HI CHSV (CAST SUPPLIES) IMPLANT
PADDING CAST ABS 3INX4YD NS (CAST SUPPLIES)
PADDING CAST ABS 4INX4YD NS (CAST SUPPLIES)
PADDING CAST ABS COTTON 3X4 (CAST SUPPLIES) IMPLANT
PADDING CAST ABS COTTON 4X4 ST (CAST SUPPLIES) IMPLANT
PADDING CAST COTTON 3X4 STRL (CAST SUPPLIES)
SCREW HEADLESS 2.0X26 (Screw) ×2 IMPLANT
SET BASIN DAY SURGERY F.S. (CUSTOM PROCEDURE TRAY) ×2 IMPLANT
SLEEVE SCD COMPRESS KNEE MED (MISCELLANEOUS) IMPLANT
SPLINT PLASTER CAST XFAST 3X15 (CAST SUPPLIES) IMPLANT
SPLINT PLASTER XTRA FASTSET 3X (CAST SUPPLIES)
STOCKINETTE 4X48 STRL (DRAPES) ×2 IMPLANT
SUT CHROMIC 4 0 P 3 18 (SUTURE) ×2 IMPLANT
SUT ETHILON 3 0 PS 1 (SUTURE) IMPLANT
SUT ETHILON 4 0 PS 2 18 (SUTURE) ×2 IMPLANT
SYR BULB EAR ULCER 3OZ GRN STR (SYRINGE) ×2 IMPLANT
SYR CONTROL 10ML LL (SYRINGE) IMPLANT
TOWEL GREEN STERILE FF (TOWEL DISPOSABLE) ×2 IMPLANT
UNDERPAD 30X36 HEAVY ABSORB (UNDERPADS AND DIAPERS) ×2 IMPLANT

## 2019-06-11 NOTE — Anesthesia Postprocedure Evaluation (Signed)
Anesthesia Post Note  Patient: Felicia [name]  Procedure(s) Performed: ARTHRODESIS INDEX FINGER DISTAL PHALANGEAL JOINT (Right Finger)     Patient location during evaluation: PACU Anesthesia Type: General Level of consciousness: awake and alert Pain management: pain level controlled Vital Signs Assessment: post-procedure vital signs reviewed and stable Respiratory status: spontaneous breathing, nonlabored ventilation and respiratory function stable Cardiovascular status: blood pressure returned to baseline and stable Postop Assessment: no apparent nausea or vomiting Anesthetic complications: no    Last Vitals:  Vitals:   06/11/19 1530 06/11/19 1545  BP: 126/72   Pulse: (!) 101 93  Resp: 15 15  Temp: 36.6 C   SpO2: 95% 92%    Last Pain:  Vitals:   06/11/19 1530  TempSrc:   PainSc: 0-No pain                 Audry Pili

## 2019-06-11 NOTE — Op Note (Signed)
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  Right index finger DIP joint arthrodesis 2.  Right index finger injection of flexor tendon sheath   SURGEON:  [name], M.D.   ASSISTANT: Daryll Brod, MD   ANESTHESIA:  General   INTRAVENOUS FLUIDS:  Per anesthesia flow sheet.   ESTIMATED BLOOD LOSS:  Minimal.   COMPLICATIONS:  None.   SPECIMENS:  none   TOURNIQUET TIME:    Total Tourniquet Time Documented: Forearm (Right) - 37 minutes Total: Forearm (Right) - 37 minutes    DISPOSITION:  Stable to PACU.   INDICATIONS: 69 year old female with painful right index finger DIP joint.  There is significant degenerative change noted on radiographs.  She wishes to have a fusion of the DIP joint.  She also wishes to have an injection of the flexor tendon sheath for management of her trigger digit. Risks, benefits and alternatives of surgery were discussed including the risks of blood loss, infection, damage to nerves, vessels, tendons, ligaments, bone for surgery, need for additional surgery, complications with wound healing, continued pain, nonunion, malunion, stiffness.  She voiced understanding of these risks and elected to proceed.  OPERATIVE COURSE:  After being identified preoperatively by myself,  the patient and I agreed on the procedure and site of the procedure.  The surgical site was marked.  Surgical consent had been signed. She was given IV antibiotics as preoperative antibiotic prophylaxis. She was transferred to the operating room and placed on the operating table in supine position with the Right upper extremity on an  arm board.  General anesthesia was induced by the anesthesiologist.  The surgical pause was performed between surgeons anesthesia and operating room staff and all were in agreement as to the patient procedure and site procedure.  After prepping with alcohol the right index finger flexor tendon sheath was injected with a solution of 1/2 cc of 1% plain lidocaine and 1/2 cc of Celestone.  The right upper extremity was prepped and draped in normal sterile orthopedic fashion.  A surgical pause was performed between the surgeons, anesthesia, and operating room staff and all were in agreement as to the patient, procedure, and site of procedure.  Tourniquet at the proximal aspect of the extremity was inflated to 250 mmHg after exsanguination of the arm with an Esmarch bandage.    An S shaped incision was made over the dorsum of the right index finger DIP joint.  This is carried in subcutaneous tissues by spreading technique.  The extensor tendon was released.  There is a large osteophyte dorsally from the distal phalanx which was removed.  The joint was entered.  The radial and ulnar collateral ligaments were released with the [facility] blade.  The joint was significantly degenerative.  There is a cyst in the distal aspect of the middle phalanx which was removed with a curette.  The articular surface of the middle phalanx was removed with the rongeurs.  This was taken down to bleeding subchondral bone.  The articular surface of the distal phalanx was also taken down using the rongeurs to bleeding subchondral bone.  Osteophytes were removed with the rondure  as well.  The DIP joint was reduced.  There was good position of the finger.  The OsteoMed screws were used.  A guidepin was placed through the distal phalanx and then into the middle phalanx individually.  The C-arm was used in AP and lateral projections to ensure appropriate position of the pin.  The pin was then placed through the distal phalanx and then retrograded back  into the middle phalanx.  The C-arm was again used in AP and lateral projections to ensure appropriate reduction of the DIP joint and position of the pin which was the case.  A 26 mm screw was selected.  This was 2.0 millimeter diameter.  It was 26 mm in length.  This was held over the finger in a radiograph taken to ensure appropriate size.  The drill was used over the guidepin.  The screw was then placed in standard fashion over the guidepin.  Good purchase was obtained.  There was good compression at the DIP joint.  C-arm was again used in AP and lateral projections to ensure appropriate reduction position of the screw which was the case.  The wound was copiously irrigated with sterile saline.  The extensor tendon was repaired with a single 5-0 chromic suture in a figure-of-eight fashion.  The skin was closed with 4-0 nylon in a horizontal mattress fashion.  Digital block was performed with quarter percent plain Marcaine to aid in postoperative analgesia.  The wound was dressed with sterile Xeroform 4 x 4 and wrapped with a Coban dressing lightly.  An AlumaFoam splint was placed and wrapped lightly with Coban dressing.  Tourniquet was deflated at 37 minutes.  Fingertips were pink with brisk capillary refill after deflation of tourniquet.  The operative  drapes were broken down.  The patient was awoken from anesthesia safely.  She was transferred back to the stretcher and taken to PACU in stable condition.  I will see her back in the office in 1 week for postoperative followup.  I will give her a prescription for Norco 5/325 1-2 tabs PO q6 hours prn pain, dispense # 20.   [name], MD Electronically signed, 06/11/19

## 2019-06-11 NOTE — H&P (Signed)
Felicia [name] is an 69 y.o. female.   Chief Complaint: right index arthritis HPI: 69 yo female with right index finger dip joint arthritis.  This is painful and bothersome to her.  She wishes to have right index finger dip joint fusion for management of the pain.  Also notes triggering of right index finger and wishes to have injection of flexor sheath as well.  Allergies:  Allergies  Allergen Reactions  . Estrogens Other (See Comments)    PATIENT HAS A HISTORY OF CANCER AND HAS BEEN TOLD TO NEVER TAKE ANYTHING CONTAINING ESTROGEN, AS IT MIGHT CAUSE A RECURRENCE  . Shrimp [Shellfish Allergy] Nausea And Vomiting    Past Medical History:  Diagnosis Date  . CAD (coronary artery disease)    a. 03/2015 s/p DES to the LAD [facility]); b. 01/2017 MV: EF 80%, small, mild apical ant defect w/o ischemia (felt to be breast atten). Low risk.  . Depression   . DM (diabetes mellitus) ([place])   . Essential hypertension   . GERD (gastroesophageal reflux disease)   . High triglycerides   . Hyperlipidemia   . Hypothyroid   . OA (osteoarthritis)   . Overflow incontinence   . PONV (postoperative nausea and vomiting)    s/p gallbladder   . RLS (restless legs syndrome)   . Uterine cancer ([place]) dx'd 2014    Past Surgical History:  Procedure Laterality Date  . CORONARY STENT PLACEMENT    . CORONARY/GRAFT ACUTE MI REVASCULARIZATION    . FOOT SURGERY Left   . LEFT HEART CATH AND CORONARY ANGIOGRAPHY N/A 02/16/2018   Procedure: LEFT HEART CATH AND CORONARY ANGIOGRAPHY;  Surgeon: Lorretta Harp, MD;  Location: [facility];  Service: Cardiovascular;  Laterality: N/A;  . REPLACEMENT TOTAL KNEE Right   . SPINE SURGERY    . TOTAL ABDOMINAL HYSTERECTOMY     FOR UTERUS CANCER  . TOTAL HIP ARTHROPLASTY Left     Family History: Family History  Problem Relation Age of Onset  . AAA (abdominal aortic aneurysm) Mother   . Diabetes Father   . Breast cancer Cousin     Social History:   reports  that she has never smoked. She has never used smokeless tobacco. She reports current alcohol use. She reports that she does not use drugs.  Medications: Medications Prior to Admission  Medication Sig Dispense Refill  . Ascorbic Acid (VITAMIN C PO) Take 1 tablet by mouth daily.    Marland Kitchen aspirin EC 81 MG tablet Take 81 mg by mouth daily.    Marland Kitchen atorvastatin (LIPITOR) 40 MG tablet Take 1 tablet (40 mg total) by mouth daily. 90 tablet 3  . bisoprolol (ZEBETA) 5 MG tablet Take 1 tablet (5 mg total) by mouth daily. 90 tablet 3  . Calcium Citrate-Vitamin D (CALCIUM + D PO) Take 1 tablet by mouth daily with supper.    . Calcium Polycarbophil (FIBERCON PO) Take 5 capsules by mouth daily.    . celecoxib (CELEBREX) 200 MG capsule Take 1 capsule by mouth 2 (two) times daily.    Marland Kitchen CINNAMON PO Take 1 capsule by mouth daily with supper.    . Dulaglutide (TRULICITY) 1.5 0000000 SOPN Inject 1.5 mg into the skin every Saturday.     . Ferrous Sulfate (IRON PO) Take 1 tablet by mouth daily. alternates days:1 tablet one day and 2 tablets the next day    . GLIPIZIDE XL 10 MG 24 hr tablet Take 10 mg by mouth daily with  breakfast.   3  . JARDIANCE 25 MG TABS tablet Take 25 mg by mouth every morning.   3  . levothyroxine (SYNTHROID, LEVOTHROID) 125 MCG tablet Take 125 mcg by mouth daily before breakfast.    . metFORMIN (GLUCOPHAGE) 500 MG tablet Take 2 tablets (1,000 mg total) by mouth See admin instructions. Take 1,000 mg by mouth 2 times a day- with breakfast and supper    . Misc Natural Products (OSTEO BI-FLEX ADV JOINT SHIELD PO) Take 1 tablet by mouth daily.    . Multiple Vitamins-Calcium (ONE-A-DAY WOMENS PO) Take 1 tablet by mouth daily.    . nortriptyline (PAMELOR) 25 MG capsule Take 25 mg by mouth at bedtime.   1  . omeprazole (PRILOSEC) 40 MG capsule Take 40 mg by mouth daily with supper.     Marland Kitchen rOPINIRole (REQUIP) 0.5 MG tablet Take 0.5 mg by mouth at bedtime. 1-3 hours prior to bedtime    . tiZANidine  (ZANAFLEX) 4 MG tablet Take 4 mg by mouth 3 (three) times daily. As needed    . TURMERIC PO Take 1 capsule by mouth daily with supper.    Marland Kitchen ACCU-CHEK GUIDE test strip 1 each by Other route as needed.    . clotrimazole-betamethasone (LOTRISONE) cream Apply 1 application topically See admin instructions. APPLY A PEA-SIZED AMOUNT TWICE DAILY AS NEEDED/AS DIRECTED FOR 10 DAYS    . diclofenac sodium (VOLTAREN) 1 % GEL Apply 2-4 g topically 4 (four) times daily as needed (as directed for pain).    . metroNIDAZOLE (METROGEL) 1 % gel Apply 1 application topically as needed (as directed to affected area).     . nitroGLYCERIN (NITROSTAT) 0.4 MG SL tablet Place 1 tablet (0.4 mg total) under the tongue every 5 (five) minutes as needed for chest pain. 25 tablet 6  . triamcinolone ointment (KENALOG) 0.1 % Apply 1 application topically 2 (two) times daily as needed (for itching).      Results for orders placed or performed during the hospital encounter of 06/11/19 (from the past 48 hour(s))  Glucose, capillary     Status: Abnormal   Collection Time: 06/11/19  1:09 PM  Result Value Ref Range   Glucose-Capillary 151 (H) 70 - 99 mg/dL    Comment: Glucose reference range applies only to samples taken after fasting for at least 8 hours.    No results found.   A comprehensive review of systems was negative.  Blood pressure 135/67, pulse 82, temperature 98.2 F (36.8 C), temperature source Oral, resp. rate 18, height 5\' 4"  (1.626 m), weight 78 kg, SpO2 98 %.  General appearance: alert, cooperative and appears stated age Head: Normocephalic, without obvious abnormality, atraumatic Neck: supple, symmetrical, trachea midline Cardio: regular rate and rhythm Resp: clear to auscultation bilaterally Extremities: Intact sensation and capillary refill all digits.  +epl/fpl/io.  No wounds.  Pulses: 2+ and symmetric Skin: Skin color, texture, turgor normal. No rashes or lesions Neurologic: Grossly  normal Incision/Wound: none  Assessment/Plan Right index finger dip joint arthritis and trigger digit.  Plan right index finger dip joint fusion.  Non operative and operative treatment options have been discussed with the patient and patient wishes to proceed with operative treatment. Risks, benefits, and alternatives of surgery have been discussed and the patient agrees with the plan of care.   [name] 06/11/2019, 1:20 PM

## 2019-06-11 NOTE — Discharge Instructions (Addendum)

## 2019-06-11 NOTE — Anesthesia Procedure Notes (Signed)
Procedure Name: LMA Insertion Date/Time: 06/11/2019 2:33 PM Performed by: Lieutenant Diego, CRNA Pre-anesthesia Checklist: Patient identified, Emergency Drugs available, Suction available and Patient being monitored Patient Re-evaluated:Patient Re-evaluated prior to induction Oxygen Delivery Method: Circle system utilized Preoxygenation: Pre-oxygenation with 100% oxygen Induction Type: IV induction Ventilation: Mask ventilation without difficulty LMA: LMA inserted and LMA with gastric port inserted LMA Size: 4.0 Number of attempts: 2 Placement Confirmation: positive ETCO2 Tube secured with: Tape Dental Injury: Teeth and Oropharynx as per pre-operative assessment

## 2019-06-11 NOTE — Anesthesia Preprocedure Evaluation (Signed)
Anesthesia Evaluation  Patient identified by MRN, date of birth, ID band Patient awake    Reviewed: Allergy & Precautions, NPO status , Patient's Chart, lab work & pertinent test results  History of Anesthesia Complications (+) PONV  Airway Mallampati: II  TM Distance: >3 FB Neck ROM: Full    Dental  (+) Dental Advisory Given   Pulmonary neg pulmonary ROS,    breath sounds clear to auscultation       Cardiovascular hypertension, Pt. on medications and Pt. on home beta blockers + CAD and + Cardiac Stents   Rhythm:Regular Rate:Normal     Neuro/Psych negative neurological ROS     GI/Hepatic Neg liver ROS, GERD  ,  Endo/Other  diabetesHypothyroidism   Renal/GU negative Renal ROS     Musculoskeletal  (+) Arthritis ,   Abdominal   Peds  Hematology negative hematology ROS (+)   Anesthesia Other Findings   Reproductive/Obstetrics                             Anesthesia Physical Anesthesia Plan  ASA: III  Anesthesia Plan: General   Post-op Pain Management:    Induction: Intravenous  PONV Risk Score and Plan: 4 or greater and Dexamethasone, Ondansetron and Treatment may vary due to age or medical condition  Airway Management Planned: LMA  Additional Equipment: None  Intra-op Plan:   Post-operative Plan: Extubation in OR  Informed Consent: I have reviewed the patients History and Physical, chart, labs and discussed the procedure including the risks, benefits and alternatives for the proposed anesthesia with the patient or authorized representative who has indicated his/her understanding and acceptance.     Dental advisory given  Plan Discussed with: CRNA  Anesthesia Plan Comments:         Anesthesia Quick Evaluation

## 2019-06-11 NOTE — Transfer of Care (Signed)
Immediate Anesthesia Transfer of Care Note  Patient: Felicia [name]  Procedure(s) Performed: ARTHRODESIS INDEX FINGER DISTAL PHALANGEAL JOINT (Right Finger)  Patient Location: PACU  Anesthesia Type:General  Level of Consciousness: awake, alert  and oriented  Airway & Oxygen Therapy: Patient Spontanous Breathing and Patient connected to face mask oxygen  Post-op Assessment: Report given to RN and Post -op Vital signs reviewed and stable  Post vital signs: Reviewed and stable  Last Vitals:  Vitals Value Taken Time  BP 126/72 06/11/19 1530  Temp    Pulse 100 06/11/19 1532  Resp 31 06/11/19 1532  SpO2 96 % 06/11/19 1532  Vitals shown include unvalidated device data.  Last Pain:  Vitals:   06/11/19 1256  TempSrc: Oral  PainSc: 3       Patients Stated Pain Goal: 2 ([id number])  Complications: No apparent anesthesia complications

## 2019-06-11 NOTE — Op Note (Signed)
I assisted Surgeon(s) and Role:    [name], MD - Primary on the Procedure(s): ARTHRODESIS INDEX FINGER DISTAL PHALANGEAL JOINT on 06/11/2019.  I provided assistance on this case as follows: Set up, approach, debridement of the joint with shaping of the bones proximally and distally, placement of the guidepin, introduction of the screw, placement of the screw and closure of the wound application dressings and splint.  Electronically signed by: Daryll Brod, MD Date: 06/11/2019 Time: 3:26 PM

## 2019-06-16 ENCOUNTER — Encounter: Payer: Self-pay | Admitting: *Deleted

## 2019-08-30 ENCOUNTER — Other Ambulatory Visit: Payer: Self-pay | Admitting: Radiology

## 2019-09-01 ENCOUNTER — Telehealth: Payer: Self-pay | Admitting: Oncology

## 2019-09-01 NOTE — Telephone Encounter (Signed)
Spoke to patient to confirm the morning [facility] appointment for 8/25, [facility] will send packet to patient

## 2019-09-02 ENCOUNTER — Encounter: Payer: Self-pay | Admitting: *Deleted

## 2019-09-02 DIAGNOSIS — Z170 Estrogen receptor positive status [ER+]: Secondary | ICD-10-CM | POA: Insufficient documentation

## 2019-09-02 DIAGNOSIS — C50412 Malignant neoplasm of upper-outer quadrant of left female breast: Secondary | ICD-10-CM | POA: Insufficient documentation

## 2019-09-08 ENCOUNTER — Inpatient Hospital Stay: Payer: Medicare Other | Attending: Oncology | Admitting: Oncology

## 2019-09-08 ENCOUNTER — Ambulatory Visit: Payer: Medicare Other | Attending: General Surgery | Admitting: Physical Therapy

## 2019-09-08 ENCOUNTER — Telehealth: Payer: Self-pay | Admitting: *Deleted

## 2019-09-08 ENCOUNTER — Other Ambulatory Visit: Payer: Self-pay | Admitting: *Deleted

## 2019-09-08 ENCOUNTER — Encounter: Payer: Self-pay | Admitting: *Deleted

## 2019-09-08 ENCOUNTER — Encounter: Payer: Self-pay | Admitting: Radiation Oncology

## 2019-09-08 ENCOUNTER — Ambulatory Visit
Admission: RE | Admit: 2019-09-08 | Discharge: 2019-09-08 | Disposition: A | Discharge status: Home / Self Care | Payer: Medicare Other | Source: Ambulatory Visit | Attending: Radiation Oncology | Admitting: Radiation Oncology

## 2019-09-08 ENCOUNTER — Inpatient Hospital Stay: Payer: Medicare Other

## 2019-09-08 ENCOUNTER — Other Ambulatory Visit: Payer: Self-pay | Admitting: General Surgery

## 2019-09-08 ENCOUNTER — Other Ambulatory Visit: Payer: Self-pay

## 2019-09-08 ENCOUNTER — Encounter: Payer: Self-pay | Admitting: Physical Therapy

## 2019-09-08 ENCOUNTER — Inpatient Hospital Stay: Payer: Medicare Other | Admitting: Licensed Clinical Social Worker

## 2019-09-08 ENCOUNTER — Encounter: Payer: Self-pay | Admitting: Oncology

## 2019-09-08 VITALS — BP 130/67 | HR 75 | Temp 97.1°F | Resp 17 | Ht 64.0 in | Wt 172.4 lb

## 2019-09-08 DIAGNOSIS — R293 Abnormal posture: Secondary | ICD-10-CM | POA: Diagnosis present

## 2019-09-08 DIAGNOSIS — Z8542 Personal history of malignant neoplasm of other parts of uterus: Secondary | ICD-10-CM

## 2019-09-08 DIAGNOSIS — Z170 Estrogen receptor positive status [ER+]: Secondary | ICD-10-CM

## 2019-09-08 DIAGNOSIS — G629 Polyneuropathy, unspecified: Secondary | ICD-10-CM | POA: Diagnosis not present

## 2019-09-08 DIAGNOSIS — Z923 Personal history of irradiation: Secondary | ICD-10-CM

## 2019-09-08 DIAGNOSIS — C50412 Malignant neoplasm of upper-outer quadrant of left female breast: Secondary | ICD-10-CM | POA: Diagnosis present

## 2019-09-08 DIAGNOSIS — Z90722 Acquired absence of ovaries, bilateral: Secondary | ICD-10-CM

## 2019-09-08 DIAGNOSIS — C541 Malignant neoplasm of endometrium: Secondary | ICD-10-CM

## 2019-09-08 DIAGNOSIS — Z90710 Acquired absence of both cervix and uterus: Secondary | ICD-10-CM | POA: Diagnosis not present

## 2019-09-08 LAB — CBC WITH DIFFERENTIAL (CANCER CENTER ONLY)
Abs Immature Granulocytes: 0.02 10*3/uL (ref 0.00–0.07)
Basophils Absolute: 0.1 10*3/uL (ref 0.0–0.1)
Basophils Relative: 1 %
Eosinophils Absolute: 0.1 10*3/uL (ref 0.0–0.5)
Eosinophils Relative: 1 %
HCT: 43.6 % (ref 36.0–46.0)
Hemoglobin: 14 g/dL (ref 12.0–15.0)
Immature Granulocytes: 0 %
Lymphocytes Relative: 65 %
Lymphs Abs: 7.3 10*3/uL — ABNORMAL HIGH (ref 0.7–4.0)
MCH: 29.1 pg (ref 26.0–34.0)
MCHC: 32.1 g/dL (ref 30.0–36.0)
MCV: 90.6 fL (ref 80.0–100.0)
Monocytes Absolute: 0.8 10*3/uL (ref 0.1–1.0)
Monocytes Relative: 7 %
Neutro Abs: 3 10*3/uL (ref 1.7–7.7)
Neutrophils Relative %: 26 %
Platelet Count: 261 10*3/uL (ref 150–400)
RBC: 4.81 MIL/uL (ref 3.87–5.11)
RDW: 16.5 % — ABNORMAL HIGH (ref 11.5–15.5)
WBC Count: 11.2 10*3/uL — ABNORMAL HIGH (ref 4.0–10.5)
nRBC: 0 % (ref 0.0–0.2)

## 2019-09-08 LAB — CMP (CANCER CENTER ONLY)
ALT: 33 U/L (ref 0–44)
AST: 18 U/L (ref 15–41)
Albumin: 3.7 g/dL (ref 3.5–5.0)
Alkaline Phosphatase: 55 U/L (ref 38–126)
Anion gap: 11 (ref 5–15)
BUN: 12 mg/dL (ref 8–23)
CO2: 24 mmol/L (ref 22–32)
Calcium: 10.6 mg/dL — ABNORMAL HIGH (ref 8.9–10.3)
Chloride: 107 mmol/L (ref 98–111)
Creatinine: 0.73 mg/dL (ref 0.44–1.00)
GFR, Est AFR Am: >60 mL/min (ref >60)
GFR, Estimated: >60 mL/min (ref >60)
Glucose, Bld: 237 mg/dL — ABNORMAL HIGH (ref 70–99)
Potassium: 3.9 mmol/L (ref 3.5–5.1)
Sodium: 142 mmol/L (ref 135–145)
Total Bilirubin: 0.3 mg/dL (ref 0.3–1.2)
Total Protein: 6.9 g/dL (ref 6.5–8.1)

## 2019-09-08 LAB — GENETIC SCREENING ORDER

## 2019-09-08 NOTE — Telephone Encounter (Signed)
Left message to call back.  Kerin Ransom PA-C 09/08/2019 3:48 PM

## 2019-09-08 NOTE — Telephone Encounter (Signed)
Patient is returning [name]'s call. Please call back.

## 2019-09-08 NOTE — Progress Notes (Signed)
Radiation Oncology         [phone] ________________________________  Initial outpatient Consultation  Name: Felicia [name] MRN: [id number]  Date: 09/08/2019  DOB: July 23, 1950  EM:[id number], Lattie Haw, MD  Rolm Bookbinder, MD   REFERRING PHYSICIAN: Rolm Bookbinder, MD  DIAGNOSIS:    ICD-10-CM   1. Malignant neoplasm of upper-outer quadrant of left breast in female, estrogen receptor positive [facility])  C50.412    Z17.0    Cancer Staging Malignant neoplasm of upper-outer quadrant of left breast in female, estrogen receptor positive ([place]) Staging form: Breast, AJCC 8th Edition - Clinical stage from 09/08/2019: Stage IA (cT1c, cN0, cM0, G2, ER+, PR+, HER2+) - Unsigned   CHIEF COMPLAINT: Here to discuss management of left breast cancer  HISTORY OF PRESENT ILLNESS::Felicia [name] is a 69 y.o. female who presented with breast abnormality on the following imaging: screening mammogram on the date of 08/03/2019.  Symptoms, if any, at that time, were: none.   Ultrasound of breast on 08/17/2019 revealed 1.6 cm upper-outer left breast mass.   Biopsy on date of 08/30/2019 showed invasive mammary carcinoma, e-cadherin positive (ductal).  ER status: 100%; PR status 40%, Her2 status positive; Grade 2.  Of note, she also has a history of grade 1A, serous 3 uterine cancer, diagnosed in 2014 in [place]. She was treated with hysterectomy with BSO and adjuvant radiation therapy.  She reports that the radiation was given internally over 3 treatments.  She has met with medical oncology and due to a history of neuropathy chemotherapy will not be recommended but she anticipates receiving Herceptin.  She reports that she has upper back and neck pain due to the weight of her breasts and she is interested in breast reduction if possible.  PREVIOUS RADIATION THERAPY: Yes  2014:  (for uterine cancer; in [place])  PAST MEDICAL HISTORY:  has a past medical history of Breast cancer ([place]), CAD (coronary artery disease),  Depression, DM (diabetes mellitus) ([place]), Essential hypertension, GERD (gastroesophageal reflux disease), High triglycerides, Hyperlipidemia, Hypothyroid, OA (osteoarthritis), Overflow incontinence, PONV (postoperative nausea and vomiting), RLS (restless legs syndrome), and Uterine cancer ([place]) (dx'd 2014).    PAST SURGICAL HISTORY: Past Surgical History:  Procedure Laterality Date  . CORONARY STENT PLACEMENT    . CORONARY/GRAFT ACUTE MI REVASCULARIZATION    . FINGER ARTHRODESIS Right 06/11/2019   Procedure: ARTHRODESIS INDEX FINGER DISTAL PHALANGEAL JOINT;  Surgeon: [name], MD;  Location: [facility];  Service: Orthopedics;  Laterality: Right;  . FOOT SURGERY Left   . LEFT HEART CATH AND CORONARY ANGIOGRAPHY N/A 02/16/2018   Procedure: LEFT HEART CATH AND CORONARY ANGIOGRAPHY;  Surgeon: Lorretta Harp, MD;  Location: [facility];  Service: Cardiovascular;  Laterality: N/A;  . REPLACEMENT TOTAL KNEE Right   . SPINE SURGERY    . TOTAL ABDOMINAL HYSTERECTOMY     FOR UTERUS CANCER  . TOTAL HIP ARTHROPLASTY Left     FAMILY HISTORY: family history includes AAA (abdominal aortic aneurysm) in her mother; Alzheimer's disease in her father; Breast cancer in her cousin; Diabetes in her father; Throat cancer in her sister.  SOCIAL HISTORY:  reports that she has never smoked. She has never used smokeless tobacco. She reports current alcohol use. She reports that she does not use drugs.  ALLERGIES: Estrogens and Shrimp [shellfish allergy]  MEDICATIONS:  Current Outpatient Medications  Medication Sig Dispense Refill  . ACCU-CHEK GUIDE test strip 1 each by Other route as needed.    . Ascorbic Acid (VITAMIN C  PO) Take 1 tablet by mouth daily.    Marland Kitchen aspirin EC 81 MG tablet Take 81 mg by mouth daily.    Marland Kitchen atorvastatin (LIPITOR) 40 MG tablet Take 1 tablet (40 mg total) by mouth daily. 90 tablet 3  . bisoprolol (ZEBETA) 5 MG tablet Take 1 tablet (5 mg total) by mouth daily. 90  tablet 3  . Calcium Citrate-Vitamin D (CALCIUM + D PO) Take 1 tablet by mouth daily with supper.    . Calcium Polycarbophil (FIBERCON PO) Take 5 capsules by mouth daily.    . celecoxib (CELEBREX) 200 MG capsule Take 1 capsule by mouth 2 (two) times daily.    Marland Kitchen CINNAMON PO Take 1 capsule by mouth daily with supper.    . clotrimazole-betamethasone (LOTRISONE) cream Apply 1 application topically See admin instructions. APPLY A PEA-SIZED AMOUNT TWICE DAILY AS NEEDED/AS DIRECTED FOR 10 DAYS    . diclofenac sodium (VOLTAREN) 1 % GEL Apply 2-4 g topically 4 (four) times daily as needed (as directed for pain).    . Dulaglutide (TRULICITY) 1.5 PP/2.9JJ SOPN Inject 1.5 mg into the skin every Saturday.     . Ferrous Sulfate (IRON PO) Take 1 tablet by mouth daily. alternates days:1 tablet one day and 2 tablets the next day    . GLIPIZIDE XL 10 MG 24 hr tablet Take 10 mg by mouth daily with breakfast.   3  . HYDROcodone-acetaminophen (NORCO) 5-325 MG tablet 1-2 tabs po q6 hours prn pain 20 tablet 0  . JARDIANCE 25 MG TABS tablet Take 25 mg by mouth every morning.   3  . levothyroxine (SYNTHROID, LEVOTHROID) 125 MCG tablet Take 125 mcg by mouth daily before breakfast.    . metFORMIN (GLUCOPHAGE) 500 MG tablet Take 2 tablets (1,000 mg total) by mouth See admin instructions. Take 1,000 mg by mouth 2 times a day- with breakfast and supper    . metroNIDAZOLE (METROGEL) 1 % gel Apply 1 application topically as needed (as directed to affected area).     . Misc Natural Products (OSTEO BI-FLEX ADV JOINT SHIELD PO) Take 1 tablet by mouth daily.    . Multiple Vitamins-Calcium (ONE-A-DAY WOMENS PO) Take 1 tablet by mouth daily.    . nitroGLYCERIN (NITROSTAT) 0.4 MG SL tablet Place 1 tablet (0.4 mg total) under the tongue every 5 (five) minutes as needed for chest pain. (Patient not taking: Reported on 09/08/2019) 25 tablet 6  . nortriptyline (PAMELOR) 25 MG capsule Take 25 mg by mouth at bedtime.   1  . omeprazole  (PRILOSEC) 40 MG capsule Take 40 mg by mouth daily with supper.     Marland Kitchen rOPINIRole (REQUIP) 0.5 MG tablet Take 0.5 mg by mouth at bedtime. 1-3 hours prior to bedtime    . tiZANidine (ZANAFLEX) 4 MG tablet Take 4 mg by mouth 3 (three) times daily. As needed    . triamcinolone ointment (KENALOG) 0.1 % Apply 1 application topically 2 (two) times daily as needed (for itching).    . TURMERIC PO Take 1 capsule by mouth daily with supper.     No current facility-administered medications for this encounter.    REVIEW OF SYSTEMS: As above   PHYSICAL EXAM:  vitals were not taken for this visit.   General: Alert and oriented, in no acute distress Extremities: No cyanosis or edema. Breasts: No palpable masses in the breasts or axilla bilaterally    ECOG = 1  0 - Asymptomatic (Fully active, able to carry on all predisease  activities without restriction)  1 - Symptomatic but completely ambulatory (Restricted in physically strenuous activity but ambulatory and able to carry out work of a light or sedentary nature. For example, light housework, office work)  2 - Symptomatic, <50% in bed during the day (Ambulatory and capable of all self care but unable to carry out any work activities. Up and about more than 50% of waking hours)  3 - Symptomatic, >50% in bed, but not bedbound (Capable of only limited self-care, confined to bed or chair 50% or more of waking hours)  4 - Bedbound (Completely disabled. Cannot carry on any self-care. Totally confined to bed or chair)  5 - Death   Eustace Pen MM, Creech RH, Tormey DC, et al. [phone]). "Toxicity and response criteria of the [facility]". [place] Oncol. 5 (6): 649-55   LABORATORY DATA:  Lab Results  Component Value Date   WBC 11.2 (H) 09/08/2019   HGB 14.0 09/08/2019   HCT 43.6 09/08/2019   MCV 90.6 09/08/2019   PLT 261 09/08/2019   CMP     Component Value Date/Time   NA 142 09/08/2019 0811   NA 139 11/25/2018 1053   K 3.9  09/08/2019 0811   CL 107 09/08/2019 0811   CO2 24 09/08/2019 0811   GLUCOSE 237 (H) 09/08/2019 0811   BUN 12 09/08/2019 0811   BUN 18 11/25/2018 1053   CREATININE 0.73 09/08/2019 0811   CALCIUM 10.6 (H) 09/08/2019 0811   PROT 6.9 09/08/2019 0811   PROT 7.1 11/25/2018 1053   ALBUMIN 3.7 09/08/2019 0811   ALBUMIN 4.2 11/25/2018 1053   AST 18 09/08/2019 0811   ALT 33 09/08/2019 0811   ALKPHOS 55 09/08/2019 0811   BILITOT 0.3 09/08/2019 0811   GFRNONAA >60 09/08/2019 0811   GFRAA >60 09/08/2019 0811         RADIOGRAPHY: As above.  I personally reviewed her imaging at tumor board this morning  IMPRESSION/PLAN: This is a lovely 69 year old woman with left Breast Cancer; she was discussed at tumor board this morning.  I personally reviewed her pathology and imaging.  It was a pleasure meeting the patient today.  She anticipates breast conserving surgery and adjuvant Herceptin. We discussed the risks, benefits, and side effects of radiotherapy. I recommend radiotherapy to the left breast to reduce her risk of locoregional recurrence by 2/3.  We discussed that radiation would take approximately 4 weeks to complete and that I would give the patient a few weeks to heal following surgery before starting treatment planning.  Radiation can be given concurrently with Herceptin.  We spoke about acute effects including skin irritation and fatigue as well as much less common late effects including internal organ injury or irritation. We spoke about the latest technology that is used to minimize the risk of late effects for patients undergoing radiotherapy to the breast or chest wall. No guarantees of treatment were given. The patient is enthusiastic about proceeding with treatment. I will await her referral back to me for postoperative follow-up and eventual CT simulation/treatment planning.  We discussed measures to reduce the risk of infection during the COVID-19 pandemic.  She has not yet received her  vaccine.  We discussed the risks of Covid and the much higher likelihood of severe illness or hospitalization if she is not vaccinated.  I explained why the vaccine is very safe.  She is adamant that she does not want to receive the vaccine.  She knows to contact the  team if she changes her mind.  I wished her the best as she starts her cancer treatment and look forward to participating in her adjuvant therapy.  On date of service, in total, I spent 45 minutes on this encounter. Patient was seen in person.   __________________________________________   Eppie Gibson, MD   This document serves as a record of services personally performed by Eppie Gibson, MD. It was created on her behalf by Wilburn Mylar, a trained medical scribe. The creation of this record is based on the scribe's personal observations and the provider's statements to them. This document has been checked and approved by the attending provider.

## 2019-09-08 NOTE — Progress Notes (Signed)
[facility] Clinical Social Work INITIAL SDOH Screening Note   Felicia [name] is a 69 y.o. year old female who presented by herself to [facility] today.   Ms. Bronk was given information about support services today including CSW contact information, information about support team members and programs.   SDOH (Social Determinants of Health) assessments performed: Yes SDOH Interventions     Most Recent Value  SDOH Interventions  SDOH Interventions for the Following Domains Financial Strain, Food Insecurity, Housing  Food Insecurity Interventions [employer] Referral  Financial Strain Interventions [id number] Referral, Development worker, community, Other (Comment)  [breast cancer foundations]  Housing Interventions [id number] Referral, Other (Comment)  [income-based housing list]        Family/Social Information:  . Housing Arrangement: patient lives alone in a rented apartment (~$900/month) . Family members/support persons in your life? Daughter & son-in-law in [place] . Transportation: drives self . Financial concerns: Yes  o Are you able to meet your monthly expenses or do you feel financially stressed? Very stressed- income not sufficient for monthly bills o Are you concerned about future financial problems due to your illness or keeping your job and income through treatment? yes . Employment: Working part time. 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 Food Security: currently has food stamps but concerned they may be revoked again . Medication Concerns: no (if patient is experiencing medication concerns, please refer to pharmacy) . Services Currently in place:  Food stamps  . Concerns about diagnosis and/or treatment: concerned that she will not be able to continue part-time job which will cause additional financial stress. Has had many health issues in her life already . Patient reported  stressors: Housing, Work/ school, Presenter, broadcasting . Patient enjoys time with grandson and family Current coping skills/ strengths: Capable of independent living     SUMMARY: Current SDOH Barriers:  . Financial constraints related to mismatch of income and expenses . Limited access to food  Clinical Social Work Clinical Goal(s):  Marland Kitchen Over the next 30 days, patient will work with [place] and financial advocate to address needs related to finances  Interventions: . Patient interviewed and SDOH assessment performed . Referred to multiple community organizations for help with food and housing. . Provided patient with information on applying to breast cancer foundations and a list of affordable housing . Provided education and assistance to client regarding Advanced Directives.   Follow Up Plan: SW will follow up with patient by phone over the next 7 days. Patient will begin looking at housing options and completing applications for assistance Patient verbalizes understanding of plan: Yes   Edwinna Areola Lyrah Bradt LCSW

## 2019-09-08 NOTE — Progress Notes (Signed)
START OFF PATHWAY REGIMEN - Breast   [id number]:Trastuzumab and hyaluronidase-oysk 600 mg/10,000 units SUBQ D1 q21 Days:   A cycle is every 21 days:     Trastuzumab and hyaluronidase-oysk   **Always confirm dose/schedule in your pharmacy ordering system**  Patient Characteristics: Postoperative without Neoadjuvant Therapy (Pathologic Staging), Invasive Disease, Adjuvant Therapy, HER2 Positive, ER Positive, Node Negative, pT2, pN0, Tumor Size ?  3 cm Therapeutic Status: Postoperative without Neoadjuvant Therapy (Pathologic Staging) AJCC Grade: G2 AJCC N Category: pN0 AJCC M Category: cM0 ER Status: Positive (+) AJCC 8 Stage Grouping: IA HER2 Status: Positive (+) Oncotype Dx Recurrence Score: Not Appropriate AJCC T Category: pT2 PR Status: Positive (+) Intent of Therapy: Curative Intent, Discussed with Patient

## 2019-09-08 NOTE — Therapy (Addendum)
[place], [place], [address] Phone: [phone]   Fax:  [phone]  Physical Therapy Evaluation and Discharge Note  Patient Details  Name: Felicia [name] MRN: [id number] Date of Birth: 12/07/1950 Referring Provider (PT): Dr. Rolm Bookbinder  PHYSICAL THERAPY DISCHARGE SUMMARY  Visits from Start of Care: 1  Current functional level related to goals / functional outcomes: Unable to assess due to unplanned discharge    Remaining deficits: Unable to assess due to unplanned discharge   Education / Equipment: Unable to assess due to unplanned discharge   Patient agrees to discharge. Patient goals were not met. Patient is being discharged due to not returning since the last visit.  Signing therapist has never treated or seen patient, but is discharging patient on behalf of last treating therapist secondary to long standing open episode that needs closing.  1:44 PM, 01/22/21 Jerene Pitch, DPT Physical Therapy with [facility]  [phone] office    Encounter Date: 09/08/2019   PT End of Session - 09/08/19 1248     Visit Number 1    Number of Visits 2    Date for PT Re-Evaluation 11/03/19    PT Start Time 3220    PT Stop Time 2542   Also saw pt from 1015-1032 for a total of 28 minutes   PT Time Calculation (min) 11 min    Activity Tolerance Patient tolerated treatment well    Behavior During Therapy [facility] for tasks assessed/performed             Past Medical History:  Diagnosis Date   Breast cancer ([place])    CAD (coronary artery disease)    a. 03/2015 s/p DES to the LAD [facility]); b. 01/2017 MV: EF 80%, small, mild apical ant defect w/o ischemia (felt to be breast atten). Low risk.   Depression    DM (diabetes mellitus) ([place])    Essential hypertension    GERD (gastroesophageal reflux disease)    High triglycerides    Hyperlipidemia    Hypothyroid    OA  (osteoarthritis)    Overflow incontinence    PONV (postoperative nausea and vomiting)    s/p gallbladder    RLS (restless legs syndrome)    Uterine cancer ([place]) dx'd 2014    Past Surgical History:  Procedure Laterality Date   CORONARY STENT PLACEMENT     CORONARY/GRAFT ACUTE MI REVASCULARIZATION     FINGER ARTHRODESIS Right 06/11/2019   Procedure: ARTHRODESIS INDEX FINGER DISTAL PHALANGEAL JOINT;  Surgeon: [name], MD;  Location: [facility];  Service: Orthopedics;  Laterality: Right;   FOOT SURGERY Left    LEFT HEART CATH AND CORONARY ANGIOGRAPHY N/A 02/16/2018   Procedure: LEFT HEART CATH AND CORONARY ANGIOGRAPHY;  Surgeon: Lorretta Harp, MD;  Location: [facility];  Service: Cardiovascular;  Laterality: N/A;   REPLACEMENT TOTAL KNEE Right    SPINE SURGERY     TOTAL ABDOMINAL HYSTERECTOMY     FOR UTERUS CANCER   TOTAL HIP ARTHROPLASTY Left     There were no vitals filed for this visit.    Subjective Assessment - 09/08/19 1234     Subjective Patient reports she is here today to be esen by her medical team for her newly diagnosed left breast cancer.    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 It measures 1.6 cm and is located in the upper  outer quadrant. Ki67 is 20%. She has a history of uterine cancer in 2014 treated with hysterectomy and radiation, a left hip replacement, right knee replacement, and a C4-C7 fusion in 2014.    Patient Stated Goals Reduce lymphedema risk and learn post op shoulder ROM HEP    Currently in Pain? Yes    Pain Score 5     Pain Location Abdomen    Pain Orientation Mid    Pain Descriptors / Indicators Aching;Sharp    Pain Type Acute pain    Pain Onset 1 to 4 weeks ago    Pain Frequency Intermittent    Aggravating Factors  unknown    Pain Relieving Factors unknown    Multiple Pain Sites No                [facility] PT Assessment -  09/08/19 0001       Assessment   Medical Diagnosis Left breast cancer    Referring Provider (PT) Dr. Rolm Bookbinder    Onset Date/Surgical Date 08/03/19    Hand Dominance Right    Prior Therapy none      Precautions   Precautions Other (comment)    Precaution Comments active cancer      Restrictions   Weight Bearing Restrictions No      Balance Screen   Has the patient fallen in the past 6 months Yes    How many times? 2    Has the patient had a decrease in activity level because of a fear of falling?  No    Is the patient reluctant to leave their home because of a fear of falling?  No      Home Environment   Living Environment Private residence    Living Arrangements Alone    Available Help at Discharge Family      Prior Function   Level of Independence Independent    Vocation Part time employment    Vocation Requirements Works part time at [employer] She does not exercise      Cognition   Overall Cognitive Status Within Functional Limits for tasks assessed      Posture/Postural Control   Posture/Postural Control Postural limitations    Postural Limitations Rounded Shoulders;Forward head      ROM / Strength   AROM / PROM / Strength AROM;Strength      AROM   AROM Assessment Site Shoulder;Cervical    Right/Left Shoulder Right;Left    Right Shoulder Extension 45 Degrees    Right Shoulder Flexion 167 Degrees    Right Shoulder ABduction 171 Degrees    Right Shoulder Internal Rotation 62 Degrees    Right Shoulder External Rotation 80 Degrees    Left Shoulder Extension 41 Degrees    Left Shoulder Flexion 151 Degrees    Left Shoulder ABduction 175 Degrees    Left Shoulder Internal Rotation 60 Degrees    Left Shoulder External Rotation 80 Degrees    Cervical Flexion 25% limited    Cervical Extension 50% limited    Cervical - Right Side Bend 75% limited    Cervical - Left Side Bend 75% limited    Cervical - Right Rotation 50% limited    Cervical -  Left Rotation 50% limited      Strength   Overall Strength Within functional limits for tasks performed               LYMPHEDEMA/ONCOLOGY QUESTIONNAIRE - 09/08/19 0001  Type   Cancer Type Left breast cancer      Lymphedema Assessments   Lymphedema Assessments Upper extremities      Right Upper Extremity Lymphedema   10 cm Proximal to Olecranon Process 25.9 cm    Olecranon Process 23.2 cm    10 cm Proximal to Ulnar Styloid Process 20.8 cm    Just Proximal to Ulnar Styloid Process 15.7 cm    Across Hand at [employer] 19.2 cm    At [place] of 2nd Digit 6.6 cm      Left Upper Extremity Lymphedema   10 cm Proximal to Olecranon Process 27 cm    Olecranon Process 23.5 cm    10 cm Proximal to Ulnar Styloid Process 20.9 cm    Just Proximal to Ulnar Styloid Process 15.5 cm    Across Hand at [employer] 18.7 cm    At [place] of 2nd Digit 6.2 cm             L-DEX FLOWSHEETS - 09/08/19 1200       L-DEX LYMPHEDEMA SCREENING   Measurement Type Unilateral    L-DEX MEASUREMENT EXTREMITY Upper Extremity    POSITION  Standing    DOMINANT SIDE Right    At Risk Side Left    BASELINE SCORE (UNILATERAL) 3.3            The patient was assessed using the L-Dex machine today to produce a lymphedema index baseline score. The patient will be reassessed on a regular basis (typically every 3 months) to obtain new L-Dex scores. If the score is > 6.5 points away from his/her baseline score indicating onset of subclinical lymphedema, it will be recommended to wear a compression garment for 4 weeks, 12 hours per day and then be reassessed. If the score continues to be > 6.5 points from baseline at reassessment, we will initiate lymphedema treatment. Assessing in this manner has a 95% rate of preventing clinically significant lymphedema.       Katina Dung - 09/08/19 0001     Open a tight or new jar Moderate difficulty    Do heavy household chores (wash walls, wash floors) Mild  difficulty    Carry a shopping bag or briefcase Mild difficulty    Wash your back Moderate difficulty    Use a knife to cut food Moderate difficulty    Recreational activities in which you take some force or impact through your arm, shoulder, or hand (golf, hammering, tennis) Moderate difficulty    During the past week, to what extent has your arm, shoulder or hand problem interfered with your normal social activities with family, friends, neighbors, or groups? Slightly    During the past week, to what extent has your arm, shoulder or hand problem limited your work or other regular daily activities Slightly    Arm, shoulder, or hand pain. Mild    Tingling (pins and needles) in your arm, shoulder, or hand Mild    Difficulty Sleeping Moderate difficulty    DASH Score 36.36 %              Objective measurements completed on examination: See above findings.       Patient was instructed today in a home exercise program today for post op shoulder range of motion. These included active assist shoulder flexion in sitting, scapular retraction, wall walking with shoulder abduction, and hands behind head external rotation.  She was encouraged to do these twice a day, holding 3  seconds and repeating 5 times when permitted by her physician.            PT Education - 09/08/19 1247     Education Details Lymphedema risk reduction and post op shoulder ROM HEP    Person(s) Educated Patient    Methods Explanation;Demonstration;Handout    Comprehension Returned demonstration;Verbalized understanding                 PT Long Term Goals - 09/08/19 1253       PT LONG TERM GOAL #1   Title Patient will demonstrate she has regained full shoulder ROM and function post operatively compared to baselines.    Time 8    Period Weeks    Status New    Target Date 11/03/19             Breast Clinic Goals - 09/08/19 1253       Patient will be able to verbalize understanding of  pertinent lymphedema risk reduction practices relevant to her diagnosis specifically related to skin care.   Time 1    Period Days    Status Achieved      Patient will be able to return demonstrate and/or verbalize understanding of the post-op home exercise program related to regaining shoulder range of motion.   Time 1    Period Days    Status Achieved      Patient will be able to verbalize understanding of the importance of attending the postoperative After Breast Cancer Class for further lymphedema risk reduction education and therapeutic exercise.   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 It measures 1.6 cm and is located in the upper outer quadrant. Ki67 is 20%. She has a history of uterine cancer in 2014 treated with hysterectomy and radiation, a left hip replacement, right knee replacement, and a C4-C7 fusion in 2014. Her multidisciplinary medical team met prior to her assessments to determine a recommended treatment plan. She is planning to have a left lumpectomy and sentinel node biopsy followed by chemotherapy, radiation, and anti-estrogen therapy. She will benefit from a post op PT reassessment to determine needs and from L-Dex screens every 3 months to detect subclinical lymphedema.    Stability/Clinical Decision Making Stable/Uncomplicated    Clinical Decision Making Low    Rehab Potential Excellent    PT Frequency --   Eval and 1 f/u visit   PT Treatment/Interventions ADLs/Self Care Home Management;Therapeutic exercise;Patient/family education    PT Next Visit Plan Will reassess 3-4 weeks post op    PT Home Exercise Plan Post op shoulder ROM HEP    Consulted and Agree with Plan of Care Patient             Patient will benefit from skilled therapeutic intervention in order to  improve the following deficits and impairments:  Decreased knowledge of precautions, Postural dysfunction, Decreased range of motion, Impaired UE functional use, Pain  Visit Diagnosis: Malignant neoplasm of upper-outer quadrant of left breast in female, estrogen receptor positive ([place]) - Plan: PT plan of care cert/re-cert  Abnormal posture - Plan: PT plan of care cert/re-cert   Patient will follow up at outpatient cancer rehab 3-4 weeks following surgery.  If  the patient requires physical therapy at that time, a specific plan will be dictated and sent to the referring physician for approval. The patient was educated today on appropriate basic range of motion exercises to begin post operatively and the importance of attending the After Breast Cancer class following surgery.  Patient was educated today on lymphedema risk reduction practices as it pertains to recommendations that will benefit the patient immediately following surgery.  She verbalized good understanding.      Problem List Patient Active Problem List   Diagnosis Date Noted   Malignant neoplasm of upper-outer quadrant of left breast in female, estrogen receptor positive ([place]) 09/02/2019   Endometrial cancer ([place]) 10/23/2018   Cirrhosis of liver without ascites ([place]) 10/23/2018   Diarrhea 10/23/2018   Chest pain 02/15/2018   Coronary artery disease involving native coronary artery of native heart without angina pectoris 11/18/2017   Hyperlipidemia LDL goal <70 11/18/2017   Type 2 diabetes mellitus without complication, without long-term current use of insulin ([place]) 11/18/2017   Dizziness 11/18/2017   Annia Friendly, PT 09/08/19 12:55 PM  [place], [place], [address] Phone: [phone]   Fax:  [phone]  Name: Skyelyn Scruggs MRN: [id number] Date of Birth: 1950-05-06

## 2019-09-08 NOTE — Progress Notes (Signed)
[place]  Telephone:(336) [phone] Fax:(336) [phone]     ID: Felicia [name] DOB: 09/19/1950  MR#: [id number]  PJS#:[id number]  Patient Care Team: Kathyrn Lass, MD as PCP - General (Family Medicine) Nahser, [name], MD as PCP - Cardiology (Cardiology) Mauro Kaufmann, RN as Oncology Nurse Navigator Rockwell Germany, RN as Oncology Nurse Navigator Rolm Bookbinder, MD as Consulting Physician (General Surgery) Byrd Terrero, Virgie Dad, MD as Consulting Physician (Oncology) Eppie Gibson, MD as Attending Physician (Radiation Oncology) [name], MD as Consulting Physician (Rheumatology) [name], MD as Consulting Physician (Orthopedic Surgery) Janyth Pupa, DO as Consulting Physician (Obstetrics and Gynecology) Chauncey Cruel, MD OTHER MD:  CHIEF COMPLAINT: Triple positive invasive breast cancer  CURRENT TREATMENT: Awaiting definitive surgery   HISTORY OF CURRENT ILLNESS: Felicia [name] had routine screening mammography on 08/03/2019 showing a possible abnormality in the left breast. She underwent left diagnostic mammography with tomography and left breast ultrasonography at [facility] on 08/17/2019 showing: breast density category B; 1.6 cm upper-outer left breast mass; no significant left axillary abnormalities.  Accordingly on 08/30/2019 she proceeded to biopsy of the left breast area in question. The pathology from this procedure ([id number]) showed: invasive mammary carcinoma, e-cadherin positive, grade 2. Prognostic indicators significant for: estrogen receptor, 100% positive and progesterone receptor, 40% positive, both with strong staining intensity. Proliferation marker Ki67 at 20%. HER2 equivocal by immunohistochemistry (2+), but negative by fluorescent in situ hybridization with a signals ratio   and number per cell  .  Of note, she also has a history of FIGO grade 3, stage IA endometrial carcinoma, diagnosed in 2014 in [place]. She underwent robotic  assisted total hysterectomy with BSO and pelvic and perirectal aortic lymph node dissection as well as omental biopsy.  Tumor was found only in the endometrium arising in a polyp measuring 1.7 cm.  There was no myometrial invasion no lymphovascular space invasion, and no lower uterine segment involvement.  The cervix and adnexa, lymph nodes and omentum were all not involved.  She then received vaginal brachytherapy, no chemotherapy, completing treatment in the springtime of 2015. [Per patient's report, chemotherapy was suggested, but she opted against this.]  The patient's subsequent history is as detailed below.   INTERVAL HISTORY: Felicia [name] was evaluated in the multidisciplinary breast cancer clinic on 09/08/2019. Her case was also presented at the multidisciplinary breast cancer conference on the same day. At that time a preliminary plan was proposed: Lumpectomy and sentinel lymph node sampling, chemotherapy, anti-HER-2 treatment, adjuvant radiation   REVIEW OF SYSTEMS: There were no specific symptoms leading to the original mammogram, which was routinely scheduled. On the provided questionnaire, Felicia [name] reports pain to her upper stomach and back, wearing glasses, incontinence, easy bruising, back and joint pain with arthritis, headaches, diabetes, thyroid problem, and anemia.  Importantly, she has significant peripheral neuropathy already present secondary to her diabetes.  This involves her feet primarily and to some extent her hands.  The patient denies unusual headaches, visual changes, nausea, vomiting, stiff neck, or dizziness.  She does have some gait imbalance and has had some falls in the last year. There has been no cough, phlegm production, or pleurisy, no chest pain or pressure, and no change in bowel or bladder habits. The patient denies fever, rash, bleeding, unexplained fatigue or unexplained weight loss. A detailed review of systems was otherwise entirely negative.   PAST MEDICAL  HISTORY: Past Medical History:  Diagnosis Date   Breast cancer ([place])    CAD (coronary artery  disease)    a. 03/2015 s/p DES to the LAD [facility]); b. 01/2017 MV: EF 80%, small, mild apical ant defect w/o ischemia (felt to be breast atten). Low risk.   Depression    DM (diabetes mellitus) ([place])    Essential hypertension    GERD (gastroesophageal reflux disease)    High triglycerides    Hyperlipidemia    Hypothyroid    OA (osteoarthritis)    Overflow incontinence    PONV (postoperative nausea and vomiting)    s/p gallbladder    RLS (restless legs syndrome)    Uterine cancer ([place]) dx'd 2014    PAST SURGICAL HISTORY: Past Surgical History:  Procedure Laterality Date   CORONARY STENT PLACEMENT     CORONARY/GRAFT ACUTE MI REVASCULARIZATION     FINGER ARTHRODESIS Right 06/11/2019   Procedure: ARTHRODESIS INDEX FINGER DISTAL PHALANGEAL JOINT;  Surgeon: [name], MD;  Location: [place];  Service: Orthopedics;  Laterality: Right;   FOOT SURGERY Left    LEFT HEART CATH AND CORONARY ANGIOGRAPHY N/A 02/16/2018   Procedure: LEFT HEART CATH AND CORONARY ANGIOGRAPHY;  Surgeon: Lorretta Harp, MD;  Location: [place] [facility];  Service: Cardiovascular;  Laterality: N/A;   REPLACEMENT TOTAL KNEE Right    SPINE SURGERY     TOTAL ABDOMINAL HYSTERECTOMY     FOR UTERUS CANCER   TOTAL HIP ARTHROPLASTY Left     FAMILY HISTORY: Family History  Problem Relation Age of Onset   AAA (abdominal aortic aneurysm) Mother    Diabetes Father    Alzheimer's disease Father    Throat cancer Sister    Breast cancer Cousin    Her father died at age 62 from Alzheimer's. Her mother died at age 55 from a ruptured abdominal aneurysm. Felicia [name] has 2 brothers and 3 sisters. She reports breast cancer in a paternal cousin. She also notes throat cancer in her sister.   GYNECOLOGIC HISTORY:  No LMP recorded. Patient has had a hysterectomy. Menarche: 69 years  old Age at first live birth: 69 years old [place] P 2 LMP 1996 Contraceptive: prior use without issue HRT never used  Hysterectomy? Yes, 2014 BSO? 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  She on PACS 40 pounds boxes of chickens, has 2 cooked them in 13 pound cooking pans, and is generally on her feet at work but does not otherwise exercise.  She is divorced. She lives at home by herself, with no pets. Daughter Felicia [name], age 28, is a radiology tech in [place]. Daughter Felicia [name], age 39, is an Optometrist in [place], [place]. Felicia [name] has one grandchild. She is not a Ambulance person.    ADVANCED DIRECTIVES: Not in place; intends to name daughter Felicia [name] as her HCPOA.   HEALTH MAINTENANCE: Social History   Tobacco Use   Smoking status: Never Smoker   Smokeless tobacco: Never Used  Vaping Use   Vaping Use: Never used  Substance Use Topics   Alcohol use: Yes    Comment: OCCASIONALLY   Drug use: No     Colonoscopy: <10 years ago, in [place]  PAP: 2014?  Bone density: date unsure, also in [place]   Allergies  Allergen Reactions   Estrogens Other (See Comments)    PATIENT HAS A HISTORY OF CANCER AND HAS BEEN TOLD TO NEVER TAKE ANYTHING CONTAINING ESTROGEN, AS IT MIGHT CAUSE A RECURRENCE   Shrimp [Shellfish Allergy] Nausea And Vomiting  Current Outpatient Medications  Medication Sig Dispense Refill   ACCU-CHEK GUIDE test strip 1 each by Other route as needed.     aspirin EC 81 MG tablet Take 81 mg by mouth daily.     atorvastatin (LIPITOR) 40 MG tablet Take 1 tablet (40 mg total) by mouth daily. 90 tablet 3   bisoprolol (ZEBETA) 5 MG tablet Take 1 tablet (5 mg total) by mouth daily. 90 tablet 3   diclofenac sodium (VOLTAREN) 1 % GEL Apply 2-4 g topically 4 (four) times daily as needed (as directed for pain).     GLIPIZIDE XL 10 MG 24 hr tablet Take 10 mg by mouth daily with breakfast.   3   JARDIANCE 25 MG TABS  tablet Take 25 mg by mouth every morning.   3   levothyroxine (SYNTHROID, LEVOTHROID) 125 MCG tablet Take 125 mcg by mouth daily before breakfast.     metFORMIN (GLUCOPHAGE) 500 MG tablet Take 2 tablets (1,000 mg total) by mouth See admin instructions. Take 1,000 mg by mouth 2 times a day- with breakfast and supper     nortriptyline (PAMELOR) 25 MG capsule Take 25 mg by mouth at bedtime.   1   omeprazole (PRILOSEC) 40 MG capsule Take 40 mg by mouth daily with supper.      rOPINIRole (REQUIP) 0.5 MG tablet Take 0.5 mg by mouth at bedtime. 1-3 hours prior to bedtime     Ascorbic Acid (VITAMIN C PO) Take 1 tablet by mouth daily.     Calcium Citrate-Vitamin D (CALCIUM + D PO) Take 1 tablet by mouth daily with supper.     Calcium Polycarbophil (FIBERCON PO) Take 5 capsules by mouth daily.     celecoxib (CELEBREX) 200 MG capsule Take 1 capsule by mouth 2 (two) times daily.     CINNAMON PO Take 1 capsule by mouth daily with supper.     clotrimazole-betamethasone (LOTRISONE) cream Apply 1 application topically See admin instructions. APPLY A PEA-SIZED AMOUNT TWICE DAILY AS NEEDED/AS DIRECTED FOR 10 DAYS     Dulaglutide (TRULICITY) 1.5 OA/4.1YS SOPN Inject 1.5 mg into the skin every Saturday.      Ferrous Sulfate (IRON PO) Take 1 tablet by mouth daily. alternates days:1 tablet one day and 2 tablets the next day     HYDROcodone-acetaminophen (NORCO) 5-325 MG tablet 1-2 tabs po q6 hours prn pain 20 tablet 0   metroNIDAZOLE (METROGEL) 1 % gel Apply 1 application topically as needed (as directed to affected area).      Misc Natural Products (OSTEO BI-FLEX ADV JOINT SHIELD PO) Take 1 tablet by mouth daily.     Multiple Vitamins-Calcium (ONE-A-DAY WOMENS PO) Take 1 tablet by mouth daily.     nitroGLYCERIN (NITROSTAT) 0.4 MG SL tablet Place 1 tablet (0.4 mg total) under the tongue every 5 (five) minutes as needed for chest pain. (Patient not taking: Reported on 09/08/2019) 25 tablet 6    tiZANidine (ZANAFLEX) 4 MG tablet Take 4 mg by mouth 3 (three) times daily. As needed     triamcinolone ointment (KENALOG) 0.1 % Apply 1 application topically 2 (two) times daily as needed (for itching).     TURMERIC PO Take 1 capsule by mouth daily with supper.     No current facility-administered medications for this visit.    OBJECTIVE: White woman who appears stated age  Vitals:   09/08/19 0858  BP: 130/67  Pulse: 75  Resp: 17  Temp: (!) 97.1 F (36.2 C)  SpO2:  99%     Body mass index is 29.59 kg/m.   Wt Readings from Last 3 Encounters:  09/08/19 172 lb 6.4 oz (78.2 kg)  06/11/19 171 lb 15.3 oz (78 kg)  05/07/19 172 lb 8 oz (78.2 kg)      ECOG FS:1 - Symptomatic but completely ambulatory  Ocular: Sclerae unicteric, pupils round and equal Ear-nose-throat: Wearing a mask Lymphatic: No cervical or supraclavicular adenopathy Lungs no rales or rhonchi Heart regular rate and rhythm Abd soft, nontender, positive bowel sounds MSK no focal spinal tenderness, no joint edema Neuro: non-focal, well-oriented, appropriate affect Breasts: The right breast is unremarkable.  The left breast is status post recent biopsy.  There is a mild ecchymosis.  Both axillae are benign.   LAB RESULTS:  CMP     Component Value Date/Time   NA 142 09/08/2019 0811   NA 139 11/25/2018 1053   K 3.9 09/08/2019 0811   CL 107 09/08/2019 0811   CO2 24 09/08/2019 0811   GLUCOSE 237 (H) 09/08/2019 0811   BUN 12 09/08/2019 0811   BUN 18 11/25/2018 1053   CREATININE 0.73 09/08/2019 0811   CALCIUM 10.6 (H) 09/08/2019 0811   PROT 6.9 09/08/2019 0811   PROT 7.1 11/25/2018 1053   ALBUMIN 3.7 09/08/2019 0811   ALBUMIN 4.2 11/25/2018 1053   AST 18 09/08/2019 0811   ALT 33 09/08/2019 0811   ALKPHOS 55 09/08/2019 0811   BILITOT 0.3 09/08/2019 0811   GFRNONAA >60 09/08/2019 0811   GFRAA >60 09/08/2019 0811    No results found for: TOTALPROTELP, ALBUMINELP, A1GS, A2GS, BETS, BETA2SER, GAMS, MSPIKE,  SPEI  Lab Results  Component Value Date   WBC 11.2 (H) 09/08/2019   NEUTROABS 3.0 09/08/2019   HGB 14.0 09/08/2019   HCT 43.6 09/08/2019   MCV 90.6 09/08/2019   PLT 261 09/08/2019    No results found for: LABCA2  No components found for: OTLXBW620  No results for input(s): INR in the last 168 hours.  No results found for: LABCA2  No results found for: BTD974  No results found for: BUL845  No results found for: XMI680  No results found for: CA2729  No components found for: HGQUANT  No results found for: CEA1 / No results found for: CEA1   No results found for: AFPTUMOR  No results found for: CHROMOGRNA  No results found for: KPAFRELGTCHN, LAMBDASER, KAPLAMBRATIO (kappa/lambda light chains)  No results found for: HGBA, HGBA2QUANT, HGBFQUANT, HGBSQUAN (Hemoglobinopathy evaluation)   No results found for: LDH  No results found for: IRON, TIBC, IRONPCTSAT (Iron and TIBC)  No results found for: FERRITIN  Urinalysis No results found for: COLORURINE, APPEARANCEUR, LABSPEC, PHURINE, GLUCOSEU, HGBUR, BILIRUBINUR, KETONESUR, PROTEINUR, UROBILINOGEN, NITRITE, LEUKOCYTESUR   STUDIES: No results found.   ELIGIBLE FOR AVAILABLE RESEARCH PROTOCOL: AET  ASSESSMENT: 69 y.o. [place] woman status post left breast upper outer quadrant biopsy 08/30/2019 for a clinical T1c N0, stage IA invasive ductal carcinoma, grade 2, estrogen and progesterone receptor positive, HER-2 amplified, with an MIB-1 of 20%.  (0) status post TAH/BSO 2014 for a FIGO grade 3, stage IA endometrial carcinoma, status post vaginal brachytherapy, no adjuvant chemotherapy  (1) definitive surgery pending  (2) Herceptin immunotherapy to follow: SubQ requested to spare the patient port placement  (a) baseline echo  (b) will not use paclitaxel secondary to severe peripheral neuropathy present at baseline  (3) adjuvant radiation  (4) antiestrogens to start at the completion of local  treatment  PLAN: I met  today with Laportia to review her new diagnosis. Specifically we discussed the biology of her breast cancer, its diagnosis, staging, treatment  options and prognosis. We first reviewed the fact that cancer is not one disease but more than 100 different diseases and that it is important to keep them separate-- otherwise when friends and relatives discuss their own cancer experiences with Felicia [name] confusion can result. Similarly we explained that if breast cancer spreads to the bone or liver, the patient would not have bone cancer or liver cancer, but breast cancer in the bone and breast cancer in the liver: one cancer in three places-- not 3 different cancers which otherwise would have to be treated in 3 different ways.  We discussed the difference between local and systemic therapy. In terms of loco-regional treatment, lumpectomy plus radiation is equivalent to mastectomy as far as survival is concerned. For this reason, and because the cosmetic results are generally superior, we recommend breast conserving surgery.   We then discussed the rationale for systemic therapy. There is some risk that this cancer may have already spread to other parts of her body. Patients frequently ask at this point about bone scans, CAT scans and PET scans to find out if they have occult breast cancer somewhere else. The problem is that in early stage disease we are much more likely to find false positives then true cancers and this would expose the patient to unnecessary procedures as well as unnecessary radiation. Scans cannot answer the question the patient really would like to know, which is whether she has microscopic disease elsewhere in her body. For those reasons we do not recommend them.  Of course we would proceed to aggressive evaluation of any symptoms that might suggest metastatic disease, but that is not the case here.  Next we went over the options for systemic therapy which are  anti-estrogens, anti-HER-2 immunotherapy, and chemotherapy. Felicia [name] meets criteria for all 3, which makes her prognosis particularly good.  We discussed standard of care in her case, which involves adjuvant paclitaxel as well as trastuzumab.  However I am concerned that this patient already has significant peripheral neuropathy.  I do not think that she would tolerate the paclitaxel and I am concerned we could make her entirely disabled secondary to that treatment.  Since she will receive trastuzumab and antiestrogens, each of which separately cuts her risk of recurrence in half, I am comfortable in her case omitting the chemotherapy.  She has a good understanding of this plan.  Since she will not be receiving paclitaxel, we have the option of giving her the trastuzumab subcutaneously.  I have entered those orders today and we should know whether this will be approved or not.  If not she will need a port.  Felicia [name] has a good understanding of the overall plan. She agrees with it. She knows the goal of treatment in her case is cure. She will call with any problems that may develop before her next visit here.  Total encounter time 65 minutes.Felicia [name] Jews C. Hubert Derstine, MD 09/08/2019 12:00 PM Medical Oncology and Hematology [facility] [place], [place] [address] Tel. [phone]    Fax. [phone]   This document serves as a record of services personally performed by Lurline Del, MD. It was created on his behalf by Wilburn Mylar, a trained medical scribe. The creation of this record is based on the scribe's personal observations and the provider's statements to them.   Joylene Grapes Devanie Galanti  MD, have reviewed the above documentation for accuracy and completeness, and I agree with the above.    *Total Encounter Time as defined by the Centers for Medicare and Medicaid Services includes, in addition to the face-to-face time of a patient visit (documented in the note  above) non-face-to-face time: obtaining and reviewing outside history, ordering and reviewing medications, tests or procedures, care coordination (communications with other health care professionals or caregivers) and documentation in the medical record.

## 2019-09-08 NOTE — Patient Instructions (Signed)

## 2019-09-08 NOTE — Telephone Encounter (Signed)
° °  [facility] Pre-operative Risk Assessment    HEARTCARE STAFF: - Please ensure there is not already an duplicate clearance open for this procedure. - Under Visit Info/Reason for Call, type in Other and utilize the format Clearance MM/DD/YY or Clearance TBD. Do not use dashes or single digits. - If request is for dental extraction, please clarify the # of teeth to be extracted.  Request for surgical clearance: URGENT REQUEST  1. What type of surgery is being performed? LEFT BREAST SEED GUIDED LUMPECTOMY FOR LEFT BREAST CANCER   2. When is this surgery scheduled? TBD   3. What type of clearance is required (medical clearance vs. Pharmacy clearance to hold med vs. Both)? MEDICAL  4. Are there any medications that need to be held prior to surgery and how long? ASA   5. Practice name and name of physician performing surgery? [facility]; DR. Rodman [place]   6. What is the office phone number? [phone]   7.   What is the office fax number? Anderson, CMA  8.   Anesthesia type (None, local, MAC, general) ? GENERAL   Julaine Hua 09/08/2019, 3:16 PM  _________________________________________________________________   (provider comments below)

## 2019-09-09 NOTE — Telephone Encounter (Signed)
Send to scheduler to schedule appt

## 2019-09-09 NOTE — Telephone Encounter (Signed)
   Primary Cardiologist:Philip Nahser, MD  Chart reviewed as part of pre-operative protocol coverage. Because of Felicia [name] past medical history and time since last visit, they will require a follow-up visit in order to better assess preoperative cardiovascular risk.  Reviewed heart cath 23/2020, widely patent LAD stent without residual disease.During our phone call, she complained of heart pounding several days ago and had planned to call our office for an appt. She is scheduled for an echo in anticipation of chemotherapy. She is also scheduled for a CT scan due to some abdominal pain radiating to her back. Given her symptoms, I recommended an office visit for clearance. Of note, she reports general anesthesia with her recent finger surgery and did well.  Pre-op covering staff: - Please schedule appointment and call patient to inform them. If patient already had an upcoming appointment within acceptable timeframe, please add "pre-op clearance" to the appointment notes so provider is aware. - Please contact requesting surgeon's office via preferred method (i.e, phone, fax) to inform them of need for appointment prior to surgery.  If applicable, this message will also be routed to pharmacy pool and/or primary cardiologist for input on holding anticoagulant/antiplatelet agent as requested below so that this information is available to the clearing provider at time of patient's appointment.   Felicia Lin [name], PA  09/09/2019, 8:25 AM

## 2019-09-10 NOTE — Telephone Encounter (Signed)
Appt made on 09/13 @ 9:40 am with Dr Cathie Olden

## 2019-09-14 ENCOUNTER — Telehealth: Payer: Self-pay | Admitting: *Deleted

## 2019-09-14 NOTE — Telephone Encounter (Signed)
Left vm for pt regarding [facility] from 8.25.21. Contact information provided for questions or needs.

## 2019-09-15 ENCOUNTER — Other Ambulatory Visit: Payer: Self-pay

## 2019-09-15 ENCOUNTER — Inpatient Hospital Stay: Payer: Medicare Other | Attending: Oncology

## 2019-09-15 ENCOUNTER — Ambulatory Visit (HOSPITAL_COMMUNITY)
Admission: RE | Admit: 2019-09-15 | Discharge: 2019-09-15 | Disposition: A | Discharge status: Home / Self Care | Payer: Medicare Other | Source: Ambulatory Visit | Attending: Oncology | Admitting: Oncology

## 2019-09-15 DIAGNOSIS — E785 Hyperlipidemia, unspecified: Secondary | ICD-10-CM | POA: Insufficient documentation

## 2019-09-15 DIAGNOSIS — Z79811 Long term (current) use of aromatase inhibitors: Secondary | ICD-10-CM | POA: Insufficient documentation

## 2019-09-15 DIAGNOSIS — Z8542 Personal history of malignant neoplasm of other parts of uterus: Secondary | ICD-10-CM | POA: Insufficient documentation

## 2019-09-15 DIAGNOSIS — Z170 Estrogen receptor positive status [ER+]: Secondary | ICD-10-CM

## 2019-09-15 DIAGNOSIS — Z90722 Acquired absence of ovaries, bilateral: Secondary | ICD-10-CM | POA: Insufficient documentation

## 2019-09-15 DIAGNOSIS — Z833 Family history of diabetes mellitus: Secondary | ICD-10-CM | POA: Insufficient documentation

## 2019-09-15 DIAGNOSIS — Z801 Family history of malignant neoplasm of trachea, bronchus and lung: Secondary | ICD-10-CM | POA: Insufficient documentation

## 2019-09-15 DIAGNOSIS — I517 Cardiomegaly: Secondary | ICD-10-CM | POA: Diagnosis not present

## 2019-09-15 DIAGNOSIS — Z818 Family history of other mental and behavioral disorders: Secondary | ICD-10-CM | POA: Insufficient documentation

## 2019-09-15 DIAGNOSIS — I10 Essential (primary) hypertension: Secondary | ICD-10-CM | POA: Insufficient documentation

## 2019-09-15 DIAGNOSIS — C50412 Malignant neoplasm of upper-outer quadrant of left female breast: Secondary | ICD-10-CM | POA: Diagnosis not present

## 2019-09-15 DIAGNOSIS — I700 Atherosclerosis of aorta: Secondary | ICD-10-CM | POA: Insufficient documentation

## 2019-09-15 DIAGNOSIS — I350 Nonrheumatic aortic (valve) stenosis: Secondary | ICD-10-CM

## 2019-09-15 DIAGNOSIS — Z8249 Family history of ischemic heart disease and other diseases of the circulatory system: Secondary | ICD-10-CM | POA: Insufficient documentation

## 2019-09-15 DIAGNOSIS — K760 Fatty (change of) liver, not elsewhere classified: Secondary | ICD-10-CM | POA: Insufficient documentation

## 2019-09-15 DIAGNOSIS — E119 Type 2 diabetes mellitus without complications: Secondary | ICD-10-CM | POA: Insufficient documentation

## 2019-09-15 DIAGNOSIS — Z79899 Other long term (current) drug therapy: Secondary | ICD-10-CM | POA: Insufficient documentation

## 2019-09-15 DIAGNOSIS — K5730 Diverticulosis of large intestine without perforation or abscess without bleeding: Secondary | ICD-10-CM | POA: Insufficient documentation

## 2019-09-15 DIAGNOSIS — K219 Gastro-esophageal reflux disease without esophagitis: Secondary | ICD-10-CM | POA: Insufficient documentation

## 2019-09-15 DIAGNOSIS — Z803 Family history of malignant neoplasm of breast: Secondary | ICD-10-CM | POA: Insufficient documentation

## 2019-09-15 DIAGNOSIS — C50919 Malignant neoplasm of unspecified site of unspecified female breast: Secondary | ICD-10-CM

## 2019-09-15 DIAGNOSIS — Z9049 Acquired absence of other specified parts of digestive tract: Secondary | ICD-10-CM | POA: Insufficient documentation

## 2019-09-15 DIAGNOSIS — Z90710 Acquired absence of both cervix and uterus: Secondary | ICD-10-CM | POA: Insufficient documentation

## 2019-09-15 DIAGNOSIS — I082 Rheumatic disorders of both aortic and tricuspid valves: Secondary | ICD-10-CM | POA: Insufficient documentation

## 2019-09-15 DIAGNOSIS — I2510 Atherosclerotic heart disease of native coronary artery without angina pectoris: Secondary | ICD-10-CM | POA: Insufficient documentation

## 2019-09-15 HISTORY — DX: Malignant neoplasm of unspecified site of unspecified female breast: C50.919

## 2019-09-15 LAB — ECHOCARDIOGRAM COMPLETE
Area-P 1/2: 3.85 cm2
S' Lateral: 2.5 cm

## 2019-09-15 NOTE — Progress Notes (Signed)
Nutrition  Patient identified by attending Breast Clinic on 09/08/2019.  Patient was given nutrition packet with RD contact information by nurse navigator.    Chart reviewed.  69 year old female with new diagnosis of breast cancer, triple positive.  Planning lumpectomy, trastuzumab (antiHER-2 treatment), and radiation.    Ht: 64 inches Wt: 172 lb BMI: 29  No unexplained weight loss.  Patient currently is not at risk of malnutrition.  Please consult RD if changes occur in nutritional status.   Felicia [name], [place], [place] Registered Dietitian [phone] (mobile)

## 2019-09-15 NOTE — Progress Notes (Signed)
error 

## 2019-09-15 NOTE — Progress Notes (Signed)
*  PRELIMINARY RESULTS* Echocardiogram 2D Echocardiogram with 3D has been performed.  Darlina Sicilian M 09/15/2019, 10:13 AM

## 2019-09-16 ENCOUNTER — Ambulatory Visit (HOSPITAL_COMMUNITY)
Admission: RE | Admit: 2019-09-16 | Discharge: 2019-09-16 | Disposition: A | Discharge status: Home / Self Care | Payer: Medicare Other | Source: Ambulatory Visit | Attending: General Surgery | Admitting: General Surgery

## 2019-09-16 ENCOUNTER — Other Ambulatory Visit: Payer: Self-pay | Admitting: Oncology

## 2019-09-16 ENCOUNTER — Encounter: Payer: Self-pay | Admitting: Licensed Clinical Social Worker

## 2019-09-16 DIAGNOSIS — Z8542 Personal history of malignant neoplasm of other parts of uterus: Secondary | ICD-10-CM | POA: Diagnosis present

## 2019-09-16 MED ORDER — IOHEXOL 300 MG/ML  SOLN
100.0000 mL | Freq: Once | INTRAMUSCULAR | Status: AC | PRN
  Administered 2019-09-16: 100 mL via INTRAVENOUS

## 2019-09-16 NOTE — Progress Notes (Signed)
[facility] CSW Progress Note  Clinical Education officer, museum contacted patient by phone to follow-up on resources given during [facility]. Per patient, she received the information sent via e-mail for housing and financial assistance. She has not yet opened any of them or started any applications. CSW encouraged patient to explore resources when she is able and to call with any questions.    Edwinna Areola Rosia Syme , LCSW

## 2019-09-24 ENCOUNTER — Encounter: Payer: Self-pay | Admitting: *Deleted

## 2019-09-27 ENCOUNTER — Other Ambulatory Visit: Payer: Self-pay

## 2019-09-27 ENCOUNTER — Ambulatory Visit (INDEPENDENT_AMBULATORY_CARE_PROVIDER_SITE_OTHER): Payer: Medicare Other | Admitting: Cardiovascular Disease

## 2019-09-27 ENCOUNTER — Encounter: Payer: Self-pay | Admitting: Cardiovascular Disease

## 2019-09-27 VITALS — BP 114/64 | HR 76 | Ht 64.0 in | Wt 171.0 lb

## 2019-09-27 DIAGNOSIS — I2510 Atherosclerotic heart disease of native coronary artery without angina pectoris: Secondary | ICD-10-CM | POA: Diagnosis not present

## 2019-09-27 DIAGNOSIS — E782 Mixed hyperlipidemia: Secondary | ICD-10-CM

## 2019-09-27 LAB — LIPID PANEL
Chol/HDL Ratio: 2.9 ratio (ref 0.0–4.4)
Cholesterol, Total: 152 mg/dL (ref 100–199)
HDL: 52 mg/dL (ref >39)
LDL Chol Calc (NIH): 57 mg/dL (ref 0–99)
Triglycerides: 277 mg/dL — ABNORMAL HIGH (ref 0–149)
VLDL Cholesterol Cal: 43 mg/dL — ABNORMAL HIGH (ref 5–40)

## 2019-09-27 NOTE — Patient Instructions (Signed)
Medication Instructions:  Your physician recommends that you continue on your current medications as directed. Please refer to the Current Medication list given to you today.  *If you need a refill on your cardiac medications before your next appointment, please call your pharmacy*   Lab Work: TODAY: LIPIDS  If you have labs (blood work) drawn today and your tests are completely normal, you will receive your results only by: Marland Kitchen MyChart Message (if you have MyChart) OR . A paper copy in the mail If you have any lab test that is abnormal or we need to change your treatment, we will call you to review the results.   Testing/Procedures: None   Follow-Up: At [facility], you and your health needs are our priority.  As part of our continuing mission to provide you with exceptional heart care, we have created designated Provider Care Teams.  These Care Teams include your primary Cardiologist (physician) and Advanced Practice Providers (APPs -  Physician Assistants and Nurse Practitioners) who all work together to provide you with the care you need, when you need it.  We recommend signing up for the patient portal called "MyChart".  Sign up information is provided on this After Visit Summary.  MyChart is used to connect with patients for Virtual Visits (Telemedicine).  Patients are able to view lab/test results, encounter notes, upcoming appointments, etc.  Non-urgent messages can be sent to your provider as well.   To learn more about what you can do with MyChart, go to [email or web address].    Your next appointment:   12 month(s)  The format for your next appointment:   In Person  Provider:   You may see Mertie Moores, MD or one of the following Advanced Practice Providers on your designated Care Team:    Richardson Dopp, PA-C  Robbie Lis, [place]    Other Instructions None

## 2019-09-27 NOTE — Progress Notes (Addendum)
Cardiology Office Note:    Date:  09/29/2019   ID:  Felicia [name], DOB 1950/02/18, MRN [id number]  PCP:  Kathyrn Lass, MD  Cardiologist:  Mertie Moores, MD    Referring MD: Kathyrn Lass, MD   Chief Complaint  Patient presents with  . Coronary Artery Disease  . Hyperlipidemia   Problem list 1.  Coronary artery disease - March, 2017,  Resolute Integrity 2.25 x 18 mm stent - mid LAD  2.  Diabetes mellitus 3.  Hypothyroidism 4.  Hyperlipidemia     Felicia [name] is a 69 y.o. female with a hx of coronary artery disease, diabetes mellitus, and hypothyroidism.  She recently moved from [place].  She is here to establish cardiology care.  Has had some unusual chest pressure .   Not exactily like her previous discomfort prior stenting . Had a stress echo which suggested  CAD.    Has chest pressure -   Band like sensation,  Does not exercise She does not think it worsens with exertion. ,  Does not radiate, not associated with increased dyspnea.  Not associated with presyncope .  The sensation comes and goes.   The pain has been present for the past 3 months .   Is on Bisoprilol 5 mg a day  Tried Metoprolol but this caused lots of fatigue and sleepyness.   May 06, 2017: Our were seen today for follow-up of her coronary artery disease.  She is status post stenting of her mid LAD March 2017. Is a history of hyperlipidemia. No angina, Exercising in the pool regularly  Has lots of arthritis .  Suggested a stationary bike or elliptical bike   Sept. 2020  Has been working - [employer] in [employer]  Not much cardiiac exercise  No CP or dyspnea  We discussed her elevated Triglyceride levels and discussed Vascepa - she does not think she could afford it   May 07, 2019:  [name] is seen today for follow-up of her coronary artery disease.  She is status post stenting of her mid LAD in March, 2017.  She has a history of hyperlipidemia. A little bit of exercise.   Does PT    Still working - the [employer] at [place] to have right trigger finger surgery  [name], MD)  She is at low risk for this finger surgery  She may hold her ASA for 5-7 days prior to surgery if needed for her surgery   Labs were drawn 2 days ago  Goal LDL is < 70  Her trigs have been elevated.   Goal trig level is 150   Sept. 13,  2021  Felicia [name] is seen today for follow up . Hx of CAD Had finger surgery with Dr. Fredna Dow since I last saw her  Is doing well  No CP or dyspnea  Has had some palpitations , last for a few seconds.  Found out recently that she had left breast cancer ( found on mamogram )  She will likely be following up with Dr. Haroldine Laws for the next year     Past Medical History:  Diagnosis Date  . Breast cancer ([place])   . CAD (coronary artery disease)    a. 03/2015 s/p DES to the LAD [facility]); b. 01/2017 MV: EF 80%, small, mild apical ant defect w/o ischemia (felt to be breast atten). Low risk.  . Depression   . DM (diabetes mellitus) ([place])   . Essential hypertension   .  GERD (gastroesophageal reflux disease)   . High triglycerides   . Hyperlipidemia   . Hypothyroid   . OA (osteoarthritis)   . Overflow incontinence   . PONV (postoperative nausea and vomiting)    s/p gallbladder   . RLS (restless legs syndrome)   . Uterine cancer ([place]) dx'd 2014    Past Surgical History:  Procedure Laterality Date  . CORONARY STENT PLACEMENT    . CORONARY/GRAFT ACUTE MI REVASCULARIZATION    . FINGER ARTHRODESIS Right 06/11/2019   Procedure: ARTHRODESIS INDEX FINGER DISTAL PHALANGEAL JOINT;  Surgeon: [name], MD;  Location: [facility];  Service: Orthopedics;  Laterality: Right;  . FOOT SURGERY Left   . LEFT HEART CATH AND CORONARY ANGIOGRAPHY N/A 02/16/2018   Procedure: LEFT HEART CATH AND CORONARY ANGIOGRAPHY;  Surgeon: Lorretta Harp, MD;  Location: [facility];  Service: Cardiovascular;  Laterality: N/A;  . REPLACEMENT TOTAL KNEE  Right   . SPINE SURGERY    . TOTAL ABDOMINAL HYSTERECTOMY     FOR UTERUS CANCER  . TOTAL HIP ARTHROPLASTY Left     Current Medications: Current Meds  Medication Sig  . ACCU-CHEK GUIDE test strip 1 each by Other route as needed.  . Ascorbic Acid (VITAMIN C PO) Take 1 tablet by mouth daily.  Marland Kitchen aspirin EC 81 MG tablet Take 81 mg by mouth daily.  Marland Kitchen atorvastatin (LIPITOR) 40 MG tablet Take 1 tablet (40 mg total) by mouth daily.  . bisoprolol (ZEBETA) 5 MG tablet Take 1 tablet (5 mg total) by mouth daily.  . Calcium Citrate-Vitamin D (CALCIUM + D PO) Take 1 tablet by mouth daily with supper.  . Calcium Polycarbophil (FIBERCON PO) Take 5 capsules by mouth daily.  . celecoxib (CELEBREX) 200 MG capsule Take 1 capsule by mouth 2 (two) times daily.  Marland Kitchen CINNAMON PO Take 1 capsule by mouth daily with supper.  . clotrimazole-betamethasone (LOTRISONE) cream Apply 1 application topically See admin instructions. APPLY A PEA-SIZED AMOUNT TWICE DAILY AS NEEDED/AS DIRECTED FOR 10 DAYS  . diclofenac sodium (VOLTAREN) 1 % GEL Apply 2-4 g topically 4 (four) times daily as needed (as directed for pain).  Marland Kitchen diclofenac Sodium (VOLTAREN) 1 % GEL Apply 1 application topically 3 (three) times daily as needed.  . Ferrous Sulfate (IRON PO) Take 1 tablet by mouth daily. alternates days:1 tablet one day and 2 tablets the next day  . GLIPIZIDE XL 10 MG 24 hr tablet Take 20 mg by mouth daily.   Marland Kitchen HYDROcodone-acetaminophen (NORCO) 5-325 MG tablet 1-2 tabs po q6 hours prn pain  . JARDIANCE 25 MG TABS tablet Take 25 mg by mouth every morning.   Marland Kitchen levothyroxine (SYNTHROID, LEVOTHROID) 125 MCG tablet Take 125 mcg by mouth daily before breakfast.  . metFORMIN (GLUCOPHAGE) 500 MG tablet Take 2 tablets (1,000 mg total) by mouth See admin instructions. Take 1,000 mg by mouth 2 times a day- with breakfast and supper  . metroNIDAZOLE (METROGEL) 1 % gel Apply 1 application topically as needed (as directed to affected area).   . Misc  Natural Products (OSTEO BI-FLEX ADV JOINT SHIELD PO) Take 1 tablet by mouth daily.  . Multiple Vitamins-Calcium (ONE-A-DAY WOMENS PO) Take 1 tablet by mouth daily.  . nitroGLYCERIN (NITROSTAT) 0.4 MG SL tablet Place 1 tablet (0.4 mg total) under the tongue every 5 (five) minutes as needed for chest pain.  . nortriptyline (PAMELOR) 25 MG capsule Take 25 mg by mouth at bedtime.   Marland Kitchen omeprazole (PRILOSEC)  40 MG capsule Take 40 mg by mouth daily with supper.   Marland Kitchen rOPINIRole (REQUIP) 0.5 MG tablet Take 0.5 mg by mouth at bedtime. 1-3 hours prior to bedtime  . tiZANidine (ZANAFLEX) 4 MG tablet Take 4 mg by mouth 3 (three) times daily. As needed  . triamcinolone ointment (KENALOG) 0.1 % Apply 1 application topically 2 (two) times daily as needed (for itching).  . TURMERIC PO Take 1 capsule by mouth daily with supper.     Allergies:   Estrogens and Shrimp [shellfish allergy]   Social History   Socioeconomic History  . Marital status: Divorced    Spouse name: Not on file  . Number of children: Not on file  . Years of education: Not on file  . Highest education level: Not on file  Occupational History  . Not on file  Tobacco Use  . Smoking status: Never Smoker  . Smokeless tobacco: Never Used  Vaping Use  . Vaping Use: Never used  Substance and Sexual Activity  . Alcohol use: Yes    Comment: OCCASIONALLY  . Drug use: No  . Sexual activity: Not on file  Other Topics Concern  . Not on file  Social History Narrative  . Not on file   Social Determinants of Health   Financial Resource Strain: High Risk  . Difficulty of Paying Living Expenses: Very hard  Food Insecurity: Food Insecurity Present  . Worried About Charity fundraiser in the Last Year: Often true  . Ran Out of Food in the Last Year: Sometimes true  Transportation Needs: No Transportation Needs  . Lack of Transportation (Medical): No  . Lack of Transportation (Non-Medical): No  Physical Activity:   . Days of Exercise per  Week: Not on file  . Minutes of Exercise per Session: Not on file  Stress:   . Feeling of Stress : Not on file  Social Connections:   . Frequency of Communication with Friends and Family: Not on file  . Frequency of Social Gatherings with Friends and Family: Not on file  . Attends Religious Services: Not on file  . Active Member of Clubs or Organizations: Not on file  . Attends Archivist Meetings: Not on file  . Marital Status: Not on file     Family History: The patient's family history includes AAA (abdominal aortic aneurysm) in her mother; Alzheimer's disease in her father; Breast cancer in her cousin; Diabetes in her father; Throat cancer in her sister. ROS:   Please see the history of present illness.     All other systems reviewed and are negative.   Physical Exam: Blood pressure 114/64, pulse 76, height 5\' 4"  (1.626 m), weight 171 lb (77.6 kg), SpO2 97 %.  GEN:  Well nourished, well developed in no acute distress HEENT: Normal NECK: No JVD; No carotid bruits LYMPHATICS: No lymphadenopathy CARDIAC: RRR , no murmurs, rubs, gallops RESPIRATORY:  Clear to auscultation without rales, wheezing or rhonchi  ABDOMEN: Soft, non-tender, non-distended MUSCULOSKELETAL:  No edema; No deformity  SKIN: Warm and dry NEUROLOGIC:  Alert and oriented x 3   Recent Labs: 09/08/2019: ALT 33; BUN 12; Creatinine 0.73; Hemoglobin 14.0; Platelet Count 261; Potassium 3.9; Sodium 142  Recent Lipid Panel    Component Value Date/Time   CHOL 152 09/27/2019 1016   TRIG 277 (H) 09/27/2019 1016   HDL 52 09/27/2019 1016   CHOLHDL 2.9 09/27/2019 1016   LDLCALC 57 09/27/2019 1016      EKGs/Labs/Other Studies  Reviewed:    EKG:       ASSESSMENT:    1. Mixed hyperlipidemia   2. Coronary artery disease involving native coronary artery of native heart without angina pectoris    PLAN:      1.   Coronary artery disease:    No angina .  She apparently needs to have hand surgery and  also breast biopsy / possible breast surgery.  She may hold her ASA for 5-7 days prior to all  procedures.   She is at low risk for both of these procedures   2.  Hyperlipidemia:    Check lipids today .  Remains labs are stable  3.  Breast cancer:   Will likely establish with our Advanced Heart failure for the next 12 months,  She will need a PICC line .   Will look to see her in a year.  Sooner if needed.     Medication Adjustments/Labs and Tests Ordered: Current medicines are reviewed at length with the patient today.  Concerns regarding medicines are outlined above.  Orders Placed This Encounter  Procedures  . Lipid panel   No orders of the defined types were placed in this encounter.   Signed, Mertie Moores, MD  09/29/2019 6:01 PM    [facility]

## 2019-09-27 NOTE — Progress Notes (Signed)
[place]  Telephone:([phone]) [phone] Fax:(336) [phone]     ID: Felicia [name] DOB: 11/21/1950  MR#: [id number]  WER#:[id number]  Patient Care Team: Kathyrn Lass, MD as PCP - General (Family Medicine) Nahser, [name], MD as PCP - Cardiology (Cardiology) Mauro Kaufmann, RN as Oncology Nurse Navigator Rockwell Germany, RN as Oncology Nurse Navigator Rolm Bookbinder, MD as Consulting Physician (General Surgery) Griffin Dewilde, Virgie Dad, MD as Consulting Physician (Oncology) Eppie Gibson, MD as Attending Physician (Radiation Oncology) [name], MD as Consulting Physician (Rheumatology) [name], MD as Consulting Physician (Orthopedic Surgery) Janyth Pupa, DO as Consulting Physician (Obstetrics and Gynecology) Chauncey Cruel, MD OTHER MD:  CHIEF COMPLAINT: Triple positive invasive breast cancer  CURRENT TREATMENT: Awaiting definitive surgery   INTERVAL HISTORY: Felicia [name] returns today for follow up of her triple positive invasive breast cancer. She was evaluated in the multidisciplinary breast cancer clinic on 09/08/2019.   Since her last visit, she underwent echocardiogram on 09/15/2019 showing an ejection fraction of 60-65%.  She also underwent CT abdomen/pelvis on 09/16/2019 for follow up of her history of uterine cancer. This showed no findings to suggest recurrent or metastatic disease.  She is scheduled for left lumpectomy and sentinel lymph node biopsy on 10/07/2019 under Dr. Donne Hazel.   REVIEW OF SYSTEMS: Felicia [name] is still working in the [employer] and is very concerned about missing work time.  Her day off is Wednesday and she would really prefer if we did everything on a Wednesday if at all.  She has been cleared by cardiology for her upcoming surgery.  She had many questions today regarding what happens after that.  A detailed review of systems was otherwise stable.   HISTORY OF CURRENT ILLNESS: From the original intake  note:  Felicia [name] had routine screening mammography on 08/03/2019 showing a possible abnormality in the left breast. She underwent left diagnostic mammography with tomography and left breast ultrasonography at [facility] on 08/17/2019 showing: breast density category B; 1.6 cm upper-outer left breast mass; no significant left axillary abnormalities.  Accordingly on 08/30/2019 she proceeded to biopsy of the left breast area in question. The pathology from this procedure ([id number]) showed: invasive mammary carcinoma, e-cadherin positive, grade 2. Prognostic indicators significant for: estrogen receptor, 100% positive and progesterone receptor, 40% positive, both with strong staining intensity. Proliferation marker Ki67 at 20%. HER2 equivocal by immunohistochemistry (2+), but negative by fluorescent in situ hybridization with a signals ratio   and number per cell  .  Of note, she also has a history of FIGO grade 3, stage IA endometrial carcinoma, diagnosed in 2014 in [place]. She underwent robotic assisted total hysterectomy with BSO and pelvic and perirectal aortic lymph node dissection as well as omental biopsy.  Tumor was found only in the endometrium arising in a polyp measuring 1.7 cm.  There was no myometrial invasion no lymphovascular space invasion, and no lower uterine segment involvement.  The cervix and adnexa, lymph nodes and omentum were all not involved.  She then received vaginal brachytherapy, no chemotherapy, completing treatment in the springtime of 2015. [Per patient's report, chemotherapy was suggested, but she opted against this.]  The patient's subsequent history is as detailed below.   PAST MEDICAL HISTORY: Past Medical History:  Diagnosis Date  . Breast cancer ([place])   . CAD (coronary artery disease)    a. 03/2015 s/p DES to the LAD [facility]); b. 01/2017 MV: EF 80%, small, mild apical ant defect w/o ischemia (felt  to be breast atten). Low risk.  . Depression   . DM  (diabetes mellitus) ([place])   . Essential hypertension   . GERD (gastroesophageal reflux disease)   . High triglycerides   . Hyperlipidemia   . Hypothyroid   . OA (osteoarthritis)   . Overflow incontinence   . PONV (postoperative nausea and vomiting)    s/p gallbladder   . RLS (restless legs syndrome)   . Uterine cancer ([place]) dx'd 2014    PAST SURGICAL HISTORY: Past Surgical History:  Procedure Laterality Date  . CORONARY STENT PLACEMENT    . CORONARY/GRAFT ACUTE MI REVASCULARIZATION    . FINGER ARTHRODESIS Right 06/11/2019   Procedure: ARTHRODESIS INDEX FINGER DISTAL PHALANGEAL JOINT;  Surgeon: [name], MD;  Location: [facility];  Service: Orthopedics;  Laterality: Right;  . FOOT SURGERY Left   . LEFT HEART CATH AND CORONARY ANGIOGRAPHY N/A 02/16/2018   Procedure: LEFT HEART CATH AND CORONARY ANGIOGRAPHY;  Surgeon: Lorretta Harp, MD;  Location: [facility];  Service: Cardiovascular;  Laterality: N/A;  . REPLACEMENT TOTAL KNEE Right   . SPINE SURGERY    . TOTAL ABDOMINAL HYSTERECTOMY     FOR UTERUS CANCER  . TOTAL HIP ARTHROPLASTY Left     FAMILY HISTORY: Family History  Problem Relation Age of Onset  . AAA (abdominal aortic aneurysm) Mother   . Diabetes Father   . Alzheimer's disease Father   . Throat cancer Sister   . Breast cancer Cousin    Her father died at age 30 from Alzheimer's. Her mother died at age 59 from a ruptured abdominal aneurysm. Felicia [name] has 2 brothers and 3 sisters. She reports breast cancer in a paternal cousin. She also notes throat cancer in her sister.   GYNECOLOGIC HISTORY:  No LMP recorded. Patient has had a hysterectomy. Menarche: 69 years old Age at first live birth: 68 years old [place] P 2 LMP 1996 Contraceptive: prior use without issue HRT never used  Hysterectomy? Yes, 2014 BSO? 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  She on PACS 40 pounds boxes of chickens, has  2 cooked them in 13 pound cooking pans, and is generally on her feet at work but does not otherwise exercise.  She is divorced. She lives at home by herself, with no pets. Daughter Pershing Proud, age 42, is a radiology tech in [place]. Daughter Martyn Malay, age 90, is an Optometrist in [place], [place]. Felicia [name] has one grandchild. She is not a Ambulance person.    ADVANCED DIRECTIVES: Not in place; intends to name daughter [name] as her HCPOA.   HEALTH MAINTENANCE: Social History   Tobacco Use  . Smoking status: Never Smoker  . Smokeless tobacco: Never Used  Vaping Use  . Vaping Use: Never used  Substance Use Topics  . Alcohol use: Yes    Comment: OCCASIONALLY  . Drug use: No     Colonoscopy: <10 years ago, in [place]  PAP: 2014?  Bone density: date unsure, also in [place]   Allergies  Allergen Reactions  . Estrogens Other (See Comments)    PATIENT HAS A HISTORY OF CANCER AND HAS BEEN TOLD TO NEVER TAKE ANYTHING CONTAINING ESTROGEN, AS IT MIGHT CAUSE A RECURRENCE  . Shrimp [Shellfish Allergy] Nausea And Vomiting    Current Outpatient Medications  Medication Sig Dispense Refill  . ACCU-CHEK GUIDE test strip 1 each by Other route as needed.    Marland Kitchen  Ascorbic Acid (VITAMIN C PO) Take 1 tablet by mouth daily.    Marland Kitchen aspirin EC 81 MG tablet Take 81 mg by mouth daily.    Marland Kitchen atorvastatin (LIPITOR) 40 MG tablet Take 1 tablet (40 mg total) by mouth daily. 90 tablet 3  . bisoprolol (ZEBETA) 5 MG tablet Take 1 tablet (5 mg total) by mouth daily. 90 tablet 3  . Calcium Citrate-Vitamin D (CALCIUM + D PO) Take 1 tablet by mouth daily with supper.    . Calcium Polycarbophil (FIBERCON PO) Take 5 capsules by mouth daily.    . celecoxib (CELEBREX) 200 MG capsule Take 1 capsule by mouth 2 (two) times daily.    Marland Kitchen CINNAMON PO Take 1 capsule by mouth daily with supper.    . clotrimazole-betamethasone (LOTRISONE) cream Apply 1 application topically See admin instructions. APPLY A PEA-SIZED AMOUNT TWICE  DAILY AS NEEDED/AS DIRECTED FOR 10 DAYS    . diclofenac sodium (VOLTAREN) 1 % GEL Apply 2-4 g topically 4 (four) times daily as needed (as directed for pain).    Marland Kitchen diclofenac Sodium (VOLTAREN) 1 % GEL Apply 1 application topically 3 (three) times daily as needed.    . Ferrous Sulfate (IRON PO) Take 1 tablet by mouth daily. alternates days:1 tablet one day and 2 tablets the next day    . GLIPIZIDE XL 10 MG 24 hr tablet Take 20 mg by mouth daily.   3  . HYDROcodone-acetaminophen (NORCO) 5-325 MG tablet 1-2 tabs po q6 hours prn pain 20 tablet 0  . JARDIANCE 25 MG TABS tablet Take 25 mg by mouth every morning.   3  . levothyroxine (SYNTHROID, LEVOTHROID) 125 MCG tablet Take 125 mcg by mouth daily before breakfast.    . metFORMIN (GLUCOPHAGE) 500 MG tablet Take 2 tablets (1,000 mg total) by mouth See admin instructions. Take 1,000 mg by mouth 2 times a day- with breakfast and supper    . metroNIDAZOLE (METROGEL) 1 % gel Apply 1 application topically as needed (as directed to affected area).     . Misc Natural Products (OSTEO BI-FLEX ADV JOINT SHIELD PO) Take 1 tablet by mouth daily.    . Multiple Vitamins-Calcium (ONE-A-DAY WOMENS PO) Take 1 tablet by mouth daily.    . nitroGLYCERIN (NITROSTAT) 0.4 MG SL tablet Place 1 tablet (0.4 mg total) under the tongue every 5 (five) minutes as needed for chest pain. 25 tablet 6  . nortriptyline (PAMELOR) 25 MG capsule Take 25 mg by mouth at bedtime.   1  . omeprazole (PRILOSEC) 40 MG capsule Take 40 mg by mouth daily with supper.     Marland Kitchen rOPINIRole (REQUIP) 0.5 MG tablet Take 0.5 mg by mouth at bedtime. 1-3 hours prior to bedtime    . tiZANidine (ZANAFLEX) 4 MG tablet Take 4 mg by mouth 3 (three) times daily. As needed    . triamcinolone ointment (KENALOG) 0.1 % Apply 1 application topically 2 (two) times daily as needed (for itching).    . TURMERIC PO Take 1 capsule by mouth daily with supper.     No current facility-administered medications for this visit.     OBJECTIVE: White woman in no acute distress  Vitals:   09/28/19 1242  BP: 125/84  Pulse: 74  Resp: 18  Temp: 97.6 F (36.4 C)  SpO2: 100%     Body mass index is 29.35 kg/m.   Wt Readings from Last 3 Encounters:  09/28/19 171 lb (77.6 kg)  09/27/19 171 lb (77.6 kg)  09/08/19 172 lb 6.4 oz (78.2 kg)      ECOG FS:1 - Symptomatic but completely ambulatory  Sclerae unicteric, EOMs intact Wearing a mask No cervical or supraclavicular adenopathy Lungs no rales or rhonchi Heart regular rate and rhythm Abd soft, nontender, positive bowel sounds MSK no focal spinal tenderness, no upper extremity lymphedema Neuro: nonfocal, well oriented, appropriate affect Breasts: The right breast is benign.  I do not palpate a well-defined mass in the left breast.  Both axillae are benign.  LAB RESULTS:  CMP     Component Value Date/Time   NA 142 09/08/2019 0811   NA 139 11/25/2018 1053   K 3.9 09/08/2019 0811   CL 107 09/08/2019 0811   CO2 24 09/08/2019 0811   GLUCOSE 237 (H) 09/08/2019 0811   BUN 12 09/08/2019 0811   BUN 18 11/25/2018 1053   CREATININE 0.73 09/08/2019 0811   CALCIUM 10.6 (H) 09/08/2019 0811   PROT 6.9 09/08/2019 0811   PROT 7.1 11/25/2018 1053   ALBUMIN 3.7 09/08/2019 0811   ALBUMIN 4.2 11/25/2018 1053   AST 18 09/08/2019 0811   ALT 33 09/08/2019 0811   ALKPHOS 55 09/08/2019 0811   BILITOT 0.3 09/08/2019 0811   GFRNONAA >60 09/08/2019 0811   GFRAA >60 09/08/2019 0811    No results found for: TOTALPROTELP, ALBUMINELP, A1GS, A2GS, BETS, BETA2SER, GAMS, MSPIKE, SPEI  Lab Results  Component Value Date   WBC 11.2 (H) 09/08/2019   NEUTROABS 3.0 09/08/2019   HGB 14.0 09/08/2019   HCT 43.6 09/08/2019   MCV 90.6 09/08/2019   PLT 261 09/08/2019    No results found for: LABCA2  No components found for: [id number]  No results for input(s): INR in the last 168 hours.  No results found for: LABCA2  No results found for: XIP382  No results found for:  NKN397  No results found for: QBH419  No results found for: CA2729  No components found for: HGQUANT  No results found for: CEA1 / No results found for: CEA1   No results found for: AFPTUMOR  No results found for: CHROMOGRNA  No results found for: KPAFRELGTCHN, LAMBDASER, KAPLAMBRATIO (kappa/lambda light chains)  No results found for: HGBA, HGBA2QUANT, HGBFQUANT, HGBSQUAN (Hemoglobinopathy evaluation)   No results found for: LDH  No results found for: IRON, TIBC, IRONPCTSAT (Iron and TIBC)  No results found for: FERRITIN  Urinalysis No results found for: COLORURINE, APPEARANCEUR, LABSPEC, PHURINE, GLUCOSEU, HGBUR, BILIRUBINUR, KETONESUR, PROTEINUR, UROBILINOGEN, NITRITE, LEUKOCYTESUR   STUDIES: CT ABDOMEN PELVIS W CONTRAST  Result Date: 09/16/2019 CLINICAL DATA:  Uterine/cervical cancer, staging hx uterine cancer, increased abd pain, r/o disease EXAM: CT ABDOMEN AND PELVIS WITH CONTRAST TECHNIQUE: Multidetector CT imaging of the abdomen and pelvis was performed using the standard protocol following bolus administration of intravenous contrast. CONTRAST:  129m OMNIPAQUE IOHEXOL 300 MG/ML  SOLN COMPARISON:  CT abdomen pelvis 10/29/2018 FINDINGS: Lower chest: At least mild right main and left anterior descending coronary artery calcifications. Otherwise no acute abnormality. Hepatobiliary: Diffusely hypodense hepatic parenchyma. Status post cholecystectomy. No biliary ductal dilatation. Pancreas: Unremarkable. No pancreatic ductal dilatation or surrounding inflammatory changes. Spleen: Choose 1 next Adrenals/Urinary Tract: Adrenal glands are unremarkable. Bilateral kidneys enhance and excrete symmetrically. No hydronephrosis or nephrolithiasis. No hydroureter or ureterolithiasis. On delayed imaging, there is no urothelial wall thickening and there are no filling defects in the opacified portions of the bilateral collecting systems or ureters. The urinary bladder is unremarkable.  Stomach/Bowel: PO contrast is noted to reach the mid  transverse colon. Stomach is within normal limits. Appendix appears normal. No evidence of bowel wall thickening, distention, or inflammatory changes. Scattered sigmoid diverticulosis. There is stool throughout the colon. Vascular/Lymphatic: Mild aortic atherosclerotic plaque. No significant vascular findings are present. No enlarged abdominal or pelvic lymph nodes. Reproductive: Status post hysterectomy. No adnexal masses. Other: No abdominal wall hernia or abnormality. No abdominopelvic ascites. Musculoskeletal: Diffusely decreased bone density. Multilevel degenerative changes of the spine including osteophyte formation and facet arthropathy. No suspicious lytic or blastic osseous lesions. No acute displaced fracture. Partially visualized total left hip arthroplasty. IMPRESSION: No intra-abdominal or intrapelvic findings to suggest recurrence or metastatic disease in a patient with history of uterine/cervical cancer status post hysterectomy. Stool throughout the colon suggestive of constipation. Scattered sigmoid diverticulosis with no acute diverticulitis. Hepatic steatosis. Aortic Atherosclerosis (ICD10-I70.0). Electronically Signed   By: Iven Finn M.D.   On: 09/16/2019 16:55   ECHOCARDIOGRAM COMPLETE  Result Date: 09/15/2019    ECHOCARDIOGRAM REPORT   Patient Name:   Felicia [name] Date of Exam: 09/15/2019 Medical Rec #:  [id number]     Height:       64.0 in Accession #:    [id number]    Weight:       172.4 lb Date of Birth:  [birth year]    BSA:          1.837 m Patient Age:    53 years      BP:           130/67 mmHg Patient Gender: F             HR:           73 bpm. Exam Location:  Outpatient Procedure: 2D Echo, 3D Echo and Strain Analysis Indications:    Chemotherapy evaluation v87.41 / v58.11  History:        Patient has prior history of Echocardiogram examinations, most                 recent 02/15/2018. CAD; Risk Factors:Dyslipidemia, Hypertension                  and Diabetes. Breast Cancer. GERD.  Sonographer:    Darlina Sicilian RDCS Referring Phys: [place]  1. Left ventricular ejection fraction, by estimation, is 60 to 65%. The left ventricle has normal function. The left ventricle has no regional wall motion abnormalities. There is mild left ventricular hypertrophy. Left ventricular diastolic parameters are indeterminate.  2. Right ventricular systolic function is normal. The right ventricular size is normal. Tricuspid regurgitation signal is inadequate for assessing PA pressure.  3. The mitral valve is normal in structure. Trivial mitral valve regurgitation. No evidence of mitral stenosis.  4. The aortic valve is grossly normal. Aortic valve regurgitation is trivial. No aortic stenosis is present.  5. The inferior vena cava is normal in size with greater than 50% respiratory variability, suggesting right atrial pressure of 3 mmHg. FINDINGS  Left Ventricle: Left ventricular ejection fraction, by estimation, is 60 to 65%. The left ventricle has normal function. The left ventricle has no regional wall motion abnormalities. Global longitudinal strain performed but not reported based on interpreter judgement due to suboptimal tracking. The left ventricular internal cavity size was normal in size. There is mild left ventricular hypertrophy. Left ventricular diastolic parameters are indeterminate. Right Ventricle: The right ventricular size is normal. No increase in right ventricular wall thickness. Right ventricular systolic function is normal. Tricuspid regurgitation signal is inadequate for assessing  PA pressure. Left Atrium: Left atrial size was normal in size. Right Atrium: Right atrial size was normal in size. Pericardium: There is no evidence of pericardial effusion. Mitral Valve: The mitral valve is normal in structure. Normal mobility of the mitral valve leaflets. Mild mitral annular calcification. Trivial mitral valve  regurgitation. No evidence of mitral valve stenosis. Tricuspid Valve: The tricuspid valve is normal in structure. Tricuspid valve regurgitation is not demonstrated. No evidence of tricuspid stenosis. Aortic Valve: The aortic valve is grossly normal. Aortic valve regurgitation is trivial. No aortic stenosis is present. There is mild calcification of the aortic valve. Pulmonic Valve: The pulmonic valve was normal in structure. Pulmonic valve regurgitation is trivial. No evidence of pulmonic stenosis. Aorta: The aortic root is normal in size and structure. Venous: The inferior vena cava is normal in size with greater than 50% respiratory variability, suggesting right atrial pressure of 3 mmHg. IAS/Shunts: No atrial level shunt detected by color flow Doppler.  LEFT VENTRICLE PLAX 2D LVIDd:         3.50 cm  Diastology LVIDs:         2.50 cm  LV e' lateral:   6.57 cm/s LV PW:         1.00 cm  LV E/e' lateral: 11.0 LV IVS:        1.10 cm  LV e' medial:    5.08 cm/s LVOT diam:     1.70 cm  LV E/e' medial:  14.2 LV SV:         29 LV SV Index:   16 LVOT Area:     2.27 cm  RIGHT VENTRICLE RV S prime:     12.50 cm/s TAPSE (M-mode): 1.7 cm LEFT ATRIUM             Index       RIGHT ATRIUM          Index LA diam:        4.10 cm 2.23 cm/m  RA Area:     9.36 cm LA Vol (A2C):   28.3 ml 15.41 ml/m RA Volume:   17.60 ml 9.58 ml/m LA Vol (A4C):   47.2 ml 25.70 ml/m LA Biplane Vol: 37.5 ml 20.42 ml/m  AORTIC VALVE LVOT Vmax:   80.50 cm/s LVOT Vmean:  54.800 cm/s LVOT VTI:    0.127 m  AORTA Ao Root diam: 3.10 cm Ao Asc diam:  2.90 cm MITRAL VALVE MV Area (PHT): 3.85 cm    SHUNTS MV Decel Time: 197 msec    Systemic VTI:  0.13 m MV E velocity: 72.00 cm/s  Systemic Diam: 1.70 cm MV A velocity: 72.00 cm/s MV E/A ratio:  1.00 Cherlynn Kaiser MD Electronically signed by Cherlynn Kaiser MD Signature Date/Time: 09/15/2019/5:38:04 PM    Final      ELIGIBLE FOR AVAILABLE RESEARCH PROTOCOL: AET  ASSESSMENT: 69 y.o. [place] woman  status post left breast upper outer quadrant biopsy 08/30/2019 for a clinical T1c N0, stage IA invasive ductal carcinoma, grade 2, estrogen and progesterone receptor positive, HER-2 amplified, with an MIB-1 of 20%.  (0) status post TAH/BSO 2014 for a FIGO grade 3, stage IA endometrial carcinoma, status post vaginal brachytherapy, no adjuvant chemotherapy  (1) definitive surgery 10/07/2019  (2) Herceptin immunotherapy to follow: SubQ requested, authorization pending  (a) baseline echo 09/15/2019 shows an ejection fraction in the 60-65% range.  (b) will not use paclitaxel secondary to severe peripheral neuropathy present at baseline  (3) adjuvant radiation  (4) antiestrogens  to start at the completion of local treatment   PLAN: Felicia [name] is ready to proceed to surgery next week.  She would like to be able to return to work as early as 2 weeks later and I asked her to discuss that with her surgeon Dr. Donne Hazel.  She is already scheduled for physical therapy mid October.  I am going to see her approximately 1 month postop.  At that time we will set her up to start Herceptin the last week in October and she will be starting radiation sometime around then as well.  At the next visit also we will discuss antiestrogens.  Anastrozole plus denosumab/Prolia may be a good choice for her if approved by her insurance  Total encounter time 25 minutes.Sarajane Jews C. Kieanna Rollo, MD 09/28/2019 3:25 PM Medical Oncology and Hematology [facility] [place], [place] [address] Tel. [phone]    Fax. [phone]   This document serves as a record of services personally performed by Lurline Del, MD. It was created on his behalf by Wilburn Mylar, a trained medical scribe. The creation of this record is based on the scribe's personal observations and the provider's statements to them.   I, Lurline Del MD, have reviewed the above documentation for accuracy and completeness,  and I agree with the above.   *Total Encounter Time as defined by the Centers for Medicare and Medicaid Services includes, in addition to the face-to-face time of a patient visit (documented in the note above) non-face-to-face time: obtaining and reviewing outside history, ordering and reviewing medications, tests or procedures, care coordination (communications with other health care professionals or caregivers) and documentation in the medical record.

## 2019-09-28 ENCOUNTER — Other Ambulatory Visit: Payer: Self-pay

## 2019-09-28 ENCOUNTER — Telehealth: Payer: Self-pay | Admitting: Oncology

## 2019-09-28 ENCOUNTER — Inpatient Hospital Stay (HOSPITAL_BASED_OUTPATIENT_CLINIC_OR_DEPARTMENT_OTHER): Payer: Medicare Other | Admitting: Oncology

## 2019-09-28 VITALS — BP 125/84 | HR 74 | Temp 97.6°F | Resp 18 | Ht 64.0 in | Wt 171.0 lb

## 2019-09-28 DIAGNOSIS — Z90722 Acquired absence of ovaries, bilateral: Secondary | ICD-10-CM | POA: Diagnosis not present

## 2019-09-28 DIAGNOSIS — Z833 Family history of diabetes mellitus: Secondary | ICD-10-CM | POA: Diagnosis not present

## 2019-09-28 DIAGNOSIS — I700 Atherosclerosis of aorta: Secondary | ICD-10-CM | POA: Diagnosis not present

## 2019-09-28 DIAGNOSIS — K760 Fatty (change of) liver, not elsewhere classified: Secondary | ICD-10-CM | POA: Insufficient documentation

## 2019-09-28 DIAGNOSIS — E785 Hyperlipidemia, unspecified: Secondary | ICD-10-CM | POA: Diagnosis not present

## 2019-09-28 DIAGNOSIS — C50412 Malignant neoplasm of upper-outer quadrant of left female breast: Secondary | ICD-10-CM | POA: Diagnosis not present

## 2019-09-28 DIAGNOSIS — Z170 Estrogen receptor positive status [ER+]: Secondary | ICD-10-CM | POA: Diagnosis not present

## 2019-09-28 DIAGNOSIS — Z8249 Family history of ischemic heart disease and other diseases of the circulatory system: Secondary | ICD-10-CM | POA: Diagnosis not present

## 2019-09-28 DIAGNOSIS — I082 Rheumatic disorders of both aortic and tricuspid valves: Secondary | ICD-10-CM | POA: Diagnosis not present

## 2019-09-28 DIAGNOSIS — K5730 Diverticulosis of large intestine without perforation or abscess without bleeding: Secondary | ICD-10-CM | POA: Diagnosis not present

## 2019-09-28 DIAGNOSIS — Z9049 Acquired absence of other specified parts of digestive tract: Secondary | ICD-10-CM | POA: Diagnosis not present

## 2019-09-28 DIAGNOSIS — Z90710 Acquired absence of both cervix and uterus: Secondary | ICD-10-CM | POA: Diagnosis not present

## 2019-09-28 DIAGNOSIS — Z8542 Personal history of malignant neoplasm of other parts of uterus: Secondary | ICD-10-CM | POA: Diagnosis not present

## 2019-09-28 DIAGNOSIS — Z803 Family history of malignant neoplasm of breast: Secondary | ICD-10-CM | POA: Diagnosis not present

## 2019-09-28 DIAGNOSIS — I2510 Atherosclerotic heart disease of native coronary artery without angina pectoris: Secondary | ICD-10-CM | POA: Diagnosis not present

## 2019-09-28 DIAGNOSIS — Z801 Family history of malignant neoplasm of trachea, bronchus and lung: Secondary | ICD-10-CM | POA: Diagnosis not present

## 2019-09-28 DIAGNOSIS — I10 Essential (primary) hypertension: Secondary | ICD-10-CM | POA: Diagnosis not present

## 2019-09-28 DIAGNOSIS — C541 Malignant neoplasm of endometrium: Secondary | ICD-10-CM

## 2019-09-28 DIAGNOSIS — Z79811 Long term (current) use of aromatase inhibitors: Secondary | ICD-10-CM | POA: Diagnosis not present

## 2019-09-28 DIAGNOSIS — Z79899 Other long term (current) drug therapy: Secondary | ICD-10-CM | POA: Diagnosis not present

## 2019-09-28 DIAGNOSIS — Z818 Family history of other mental and behavioral disorders: Secondary | ICD-10-CM | POA: Diagnosis not present

## 2019-09-28 DIAGNOSIS — E119 Type 2 diabetes mellitus without complications: Secondary | ICD-10-CM | POA: Diagnosis not present

## 2019-09-28 NOTE — Telephone Encounter (Signed)
Scheduled appts per 9/14 los. Gave pt a print out of AVS.  

## 2019-09-29 ENCOUNTER — Telehealth: Payer: Self-pay | Admitting: *Deleted

## 2019-09-29 ENCOUNTER — Encounter: Payer: Self-pay | Admitting: *Deleted

## 2019-09-29 NOTE — Telephone Encounter (Signed)
[facility] was calling to check the status of the surgical clearance. The patient had an appointment 09/13 but nothing in the visit note states whether the patient is cleared for surgery or not. Please fax clearance to [phone]

## 2019-09-29 NOTE — Telephone Encounter (Signed)
Pt is at low risk for her upcoming breast seed implant / breast surgery . She may hold her ASA for 5-7 days prior to surgical procedure.

## 2019-09-29 NOTE — Telephone Encounter (Signed)
Left vm for pt to return call regarding treatment care plan. Contact information provided.

## 2019-09-29 NOTE — Telephone Encounter (Signed)
Dr. Acie Fredrickson, please review. Patient was seen 2 days ago at which time, she was doing well. Do you feel she is stable to proceed with L breast seed guided lumpectomy for breast CA? Can she hold ASA prior to the procedure?  Felicia [name] is a 69 yo female with PMH of CAD, HLD, DM II and hypothyroidism. Last PCI was in March 2017. Repeat cath in Feb 2020 showed widely patent LAD stent.   Please send your response to P CV DIV PREOP

## 2019-09-30 NOTE — Telephone Encounter (Signed)
   Primary Cardiologist: Mertie Moores, MD  Chart reviewed as part of pre-operative protocol coverage.  Felicia [name] was recently seen by Dr. Acie Fredrickson 09/27/19 for history of CAD, HLD, DM II and hypothyroidism with last PCI in March 2017 and repeat cath in Feb 2020 showing widely patent LAD stent.  Per Dr. Acie Fredrickson, patient would be at acceptable risk for the planned procedure without further cardiovascular testing.   She can hold aspirin 5-7 days prior to her procedure with plans to restart as soon as she is cleared to do so by her surgeon.   I will route this recommendation to the requesting party via Epic fax function and remove from pre-op pool.  Please call with questions.  Abigail Butts, PA-C 09/30/2019, 9:56 AM

## 2019-10-01 ENCOUNTER — Encounter (HOSPITAL_BASED_OUTPATIENT_CLINIC_OR_DEPARTMENT_OTHER): Payer: Self-pay | Admitting: General Surgery

## 2019-10-01 ENCOUNTER — Other Ambulatory Visit: Payer: Self-pay | Admitting: Oncology

## 2019-10-01 ENCOUNTER — Other Ambulatory Visit: Payer: Self-pay

## 2019-10-01 NOTE — Progress Notes (Signed)
Her subQ herceptin has been approved

## 2019-10-04 ENCOUNTER — Other Ambulatory Visit (HOSPITAL_COMMUNITY)
Admission: RE | Admit: 2019-10-04 | Discharge: 2019-10-04 | Disposition: A | Discharge status: Home / Self Care | Payer: Medicare Other | Source: Ambulatory Visit | Attending: General Surgery | Admitting: General Surgery

## 2019-10-04 ENCOUNTER — Encounter (HOSPITAL_BASED_OUTPATIENT_CLINIC_OR_DEPARTMENT_OTHER)
Admission: RE | Admit: 2019-10-04 | Discharge: 2019-10-04 | Disposition: A | Discharge status: Home / Self Care | Payer: Medicare Other | Source: Ambulatory Visit

## 2019-10-04 DIAGNOSIS — Z01812 Encounter for preprocedural laboratory examination: Secondary | ICD-10-CM | POA: Insufficient documentation

## 2019-10-04 DIAGNOSIS — Z20822 Contact with and (suspected) exposure to covid-19: Secondary | ICD-10-CM | POA: Diagnosis not present

## 2019-10-04 LAB — BASIC METABOLIC PANEL
Anion gap: 13 (ref 5–15)
BUN: 13 mg/dL (ref 8–23)
CO2: 23 mmol/L (ref 22–32)
Calcium: 9.9 mg/dL (ref 8.9–10.3)
Chloride: 103 mmol/L (ref 98–111)
Creatinine, Ser: 0.63 mg/dL (ref 0.44–1.00)
GFR calc Af Amer: >60 mL/min (ref >60)
GFR calc non Af Amer: >60 mL/min (ref >60)
Glucose, Bld: 237 mg/dL — ABNORMAL HIGH (ref 70–99)
Potassium: 4.5 mmol/L (ref 3.5–5.1)
Sodium: 139 mmol/L (ref 135–145)

## 2019-10-04 MED ORDER — ENSURE PRE-SURGERY PO LIQD: 296.0000 mL | Freq: Once | ORAL | Status: DC

## 2019-10-04 NOTE — Progress Notes (Signed)

## 2019-10-05 LAB — SARS CORONAVIRUS 2 (TAT 6-24 HRS): SARS Coronavirus 2: NEGATIVE

## 2019-10-06 NOTE — Anesthesia Preprocedure Evaluation (Addendum)
Anesthesia Evaluation  Patient identified by MRN, date of birth, ID band Patient awake    Reviewed: Allergy & Precautions, NPO status , Patient's Chart, lab work & pertinent test results  History of Anesthesia Complications (+) PONV and history of anesthetic complications  Airway Mallampati: II  TM Distance: >3 FB Neck ROM: Full    Dental no notable dental hx.    Pulmonary neg pulmonary ROS,    Pulmonary exam normal breath sounds clear to auscultation       Cardiovascular hypertension, Pt. on home beta blockers + CAD and + Cardiac Stents  Normal cardiovascular exam Rhythm:Regular Rate:Normal  Per-op eval per cardiologist Sondra Come)   Neuro/Psych  Headaches, PSYCHIATRIC DISORDERS Depression    GI/Hepatic Neg liver ROS, GERD  Medicated and Controlled,  Endo/Other  diabetes, Oral Hypoglycemic AgentsHypothyroidism   Renal/GU negative Renal ROS     Musculoskeletal  (+) Arthritis ,   Abdominal   Peds  Hematology HLD   Anesthesia Other Findings LEFT BREAST CANCER  Reproductive/Obstetrics                            Anesthesia Physical Anesthesia Plan  ASA: III  Anesthesia Plan: General and Regional   Post-op Pain Management: GA combined w/ Regional for post-op pain   Induction: Intravenous  PONV Risk Score and Plan: 4 or greater and Ondansetron, Dexamethasone, Propofol infusion, Midazolam and Treatment may vary due to age or medical condition  Airway Management Planned: LMA  Additional Equipment:   Intra-op Plan:   Post-operative Plan: Extubation in OR  Informed Consent: I have reviewed the patients History and Physical, chart, labs and discussed the procedure including the risks, benefits and alternatives for the proposed anesthesia with the patient or authorized representative who has indicated his/her understanding and acceptance.     Dental advisory given  Plan Discussed with:  CRNA  Anesthesia Plan Comments:        Anesthesia Quick Evaluation

## 2019-10-07 ENCOUNTER — Encounter (HOSPITAL_BASED_OUTPATIENT_CLINIC_OR_DEPARTMENT_OTHER)
Admission: RE | Disposition: A | Discharge status: Home / Self Care | Payer: Self-pay | Source: Home / Self Care | Attending: General Surgery

## 2019-10-07 ENCOUNTER — Other Ambulatory Visit: Payer: Self-pay

## 2019-10-07 ENCOUNTER — Encounter (HOSPITAL_BASED_OUTPATIENT_CLINIC_OR_DEPARTMENT_OTHER): Payer: Self-pay | Admitting: General Surgery

## 2019-10-07 ENCOUNTER — Ambulatory Visit (HOSPITAL_BASED_OUTPATIENT_CLINIC_OR_DEPARTMENT_OTHER)
Admission: RE | Admit: 2019-10-07 | Discharge: 2019-10-07 | Disposition: A | Discharge status: Home / Self Care | Payer: Medicare Other | Attending: General Surgery | Admitting: General Surgery

## 2019-10-07 ENCOUNTER — Ambulatory Visit (HOSPITAL_BASED_OUTPATIENT_CLINIC_OR_DEPARTMENT_OTHER): Payer: Medicare Other | Admitting: Anesthesiology

## 2019-10-07 ENCOUNTER — Encounter (HOSPITAL_COMMUNITY)
Admission: RE | Admit: 2019-10-07 | Discharge: 2019-10-07 | Disposition: A | Discharge status: Home / Self Care | Payer: Medicare Other | Source: Ambulatory Visit | Attending: General Surgery | Admitting: General Surgery

## 2019-10-07 DIAGNOSIS — R519 Headache, unspecified: Secondary | ICD-10-CM | POA: Diagnosis not present

## 2019-10-07 DIAGNOSIS — K5790 Diverticulosis of intestine, part unspecified, without perforation or abscess without bleeding: Secondary | ICD-10-CM | POA: Insufficient documentation

## 2019-10-07 DIAGNOSIS — Z833 Family history of diabetes mellitus: Secondary | ICD-10-CM | POA: Insufficient documentation

## 2019-10-07 DIAGNOSIS — I10 Essential (primary) hypertension: Secondary | ICD-10-CM | POA: Diagnosis not present

## 2019-10-07 DIAGNOSIS — Z170 Estrogen receptor positive status [ER+]: Secondary | ICD-10-CM

## 2019-10-07 DIAGNOSIS — K219 Gastro-esophageal reflux disease without esophagitis: Secondary | ICD-10-CM | POA: Insufficient documentation

## 2019-10-07 DIAGNOSIS — Z8542 Personal history of malignant neoplasm of other parts of uterus: Secondary | ICD-10-CM | POA: Diagnosis not present

## 2019-10-07 DIAGNOSIS — Z9049 Acquired absence of other specified parts of digestive tract: Secondary | ICD-10-CM | POA: Diagnosis not present

## 2019-10-07 DIAGNOSIS — C50912 Malignant neoplasm of unspecified site of left female breast: Secondary | ICD-10-CM | POA: Diagnosis present

## 2019-10-07 DIAGNOSIS — M1990 Unspecified osteoarthritis, unspecified site: Secondary | ICD-10-CM | POA: Diagnosis not present

## 2019-10-07 DIAGNOSIS — E039 Hypothyroidism, unspecified: Secondary | ICD-10-CM | POA: Insufficient documentation

## 2019-10-07 DIAGNOSIS — E119 Type 2 diabetes mellitus without complications: Secondary | ICD-10-CM | POA: Diagnosis not present

## 2019-10-07 DIAGNOSIS — F329 Major depressive disorder, single episode, unspecified: Secondary | ICD-10-CM | POA: Insufficient documentation

## 2019-10-07 DIAGNOSIS — Z90710 Acquired absence of both cervix and uterus: Secondary | ICD-10-CM | POA: Insufficient documentation

## 2019-10-07 DIAGNOSIS — Z9221 Personal history of antineoplastic chemotherapy: Secondary | ICD-10-CM | POA: Diagnosis not present

## 2019-10-07 DIAGNOSIS — C773 Secondary and unspecified malignant neoplasm of axilla and upper limb lymph nodes: Secondary | ICD-10-CM | POA: Insufficient documentation

## 2019-10-07 DIAGNOSIS — Z955 Presence of coronary angioplasty implant and graft: Secondary | ICD-10-CM | POA: Insufficient documentation

## 2019-10-07 DIAGNOSIS — C50412 Malignant neoplasm of upper-outer quadrant of left female breast: Secondary | ICD-10-CM | POA: Diagnosis not present

## 2019-10-07 DIAGNOSIS — Z7984 Long term (current) use of oral hypoglycemic drugs: Secondary | ICD-10-CM | POA: Insufficient documentation

## 2019-10-07 DIAGNOSIS — E785 Hyperlipidemia, unspecified: Secondary | ICD-10-CM | POA: Diagnosis not present

## 2019-10-07 DIAGNOSIS — I2510 Atherosclerotic heart disease of native coronary artery without angina pectoris: Secondary | ICD-10-CM | POA: Insufficient documentation

## 2019-10-07 HISTORY — DX: Anemia, unspecified: D64.9

## 2019-10-07 HISTORY — PX: BREAST LUMPECTOMY WITH RADIOACTIVE SEED AND SENTINEL LYMPH NODE BIOPSY: SHX6550

## 2019-10-07 HISTORY — DX: Urgency of urination: R39.15

## 2019-10-07 HISTORY — DX: Headache, unspecified: R51.9

## 2019-10-07 LAB — GLUCOSE, CAPILLARY
Glucose-Capillary: 192 mg/dL — ABNORMAL HIGH (ref 70–99)
Glucose-Capillary: 205 mg/dL — ABNORMAL HIGH (ref 70–99)

## 2019-10-07 SURGERY — BREAST LUMPECTOMY WITH RADIOACTIVE SEED AND SENTINEL LYMPH NODE BIOPSY
Anesthesia: Regional | Site: Breast | Laterality: Left

## 2019-10-07 MED ORDER — HEPARIN SOD (PORK) LOCK FLUSH 100 UNIT/ML IV SOLN
INTRAVENOUS | Status: AC
  Filled 2019-10-07: qty 5

## 2019-10-07 MED ORDER — LIDOCAINE HCL (CARDIAC) PF 100 MG/5ML IV SOSY
PREFILLED_SYRINGE | INTRAVENOUS | Status: DC | PRN
  Administered 2019-10-07: 60 mg via INTRAVENOUS

## 2019-10-07 MED ORDER — FENTANYL CITRATE (PF) 100 MCG/2ML IJ SOLN
100.0000 ug | Freq: Once | INTRAMUSCULAR | Status: AC
  Administered 2019-10-07: 50 ug via INTRAVENOUS

## 2019-10-07 MED ORDER — ONDANSETRON HCL 4 MG/2ML IJ SOLN: 4.0000 mg | Freq: Once | INTRAMUSCULAR | Status: DC | PRN

## 2019-10-07 MED ORDER — ROPIVACAINE HCL 5 MG/ML IJ SOLN
INTRAMUSCULAR | Status: DC | PRN
  Administered 2019-10-07: 30 mL via PERINEURAL

## 2019-10-07 MED ORDER — ONDANSETRON HCL 4 MG/2ML IJ SOLN
INTRAMUSCULAR | Status: AC
  Filled 2019-10-07: qty 2

## 2019-10-07 MED ORDER — FENTANYL CITRATE (PF) 100 MCG/2ML IJ SOLN
INTRAMUSCULAR | Status: AC
  Filled 2019-10-07: qty 2

## 2019-10-07 MED ORDER — MIDAZOLAM HCL 2 MG/2ML IJ SOLN
INTRAMUSCULAR | Status: AC
  Filled 2019-10-07: qty 2

## 2019-10-07 MED ORDER — KETOROLAC TROMETHAMINE 15 MG/ML IJ SOLN: 15.0000 mg | Freq: Once | INTRAMUSCULAR | Status: DC | PRN

## 2019-10-07 MED ORDER — BUPIVACAINE HCL (PF) 0.25 % IJ SOLN
INTRAMUSCULAR | Status: AC
  Filled 2019-10-07: qty 90

## 2019-10-07 MED ORDER — ACETAMINOPHEN 500 MG PO TABS
ORAL_TABLET | ORAL | Status: AC
  Filled 2019-10-07: qty 2

## 2019-10-07 MED ORDER — FENTANYL CITRATE (PF) 100 MCG/2ML IJ SOLN
INTRAMUSCULAR | Status: DC | PRN
Start: 2019-10-07 — End: 2019-10-07
  Administered 2019-10-07 (×2): 50 ug via INTRAVENOUS

## 2019-10-07 MED ORDER — PHENYLEPHRINE 40 MCG/ML (10ML) SYRINGE FOR IV PUSH (FOR BLOOD PRESSURE SUPPORT)
PREFILLED_SYRINGE | INTRAVENOUS | Status: AC
  Filled 2019-10-07: qty 10

## 2019-10-07 MED ORDER — SODIUM CHLORIDE (PF) 0.9 % IJ SOLN
INTRAMUSCULAR | Status: AC
  Filled 2019-10-07: qty 10

## 2019-10-07 MED ORDER — METHYLENE BLUE 0.5 % INJ SOLN
INTRAVENOUS | Status: AC
  Filled 2019-10-07: qty 10

## 2019-10-07 MED ORDER — MIDAZOLAM HCL 2 MG/2ML IJ SOLN
2.0000 mg | Freq: Once | INTRAMUSCULAR | Status: AC
  Administered 2019-10-07: 1 mg via INTRAVENOUS

## 2019-10-07 MED ORDER — CEFAZOLIN SODIUM-DEXTROSE 2-4 GM/100ML-% IV SOLN
2.0000 g | INTRAVENOUS | Status: AC
  Administered 2019-10-07: 2 g via INTRAVENOUS

## 2019-10-07 MED ORDER — HYDROCODONE-ACETAMINOPHEN 5-325 MG PO TABS: 1.0000 | ORAL_TABLET | Freq: Four times a day (QID) | ORAL | 0 refills | Status: DC | PRN

## 2019-10-07 MED ORDER — EPHEDRINE 5 MG/ML INJ
INTRAVENOUS | Status: AC
  Filled 2019-10-07: qty 10

## 2019-10-07 MED ORDER — DEXAMETHASONE SODIUM PHOSPHATE 10 MG/ML IJ SOLN
INTRAMUSCULAR | Status: AC
  Filled 2019-10-07: qty 1

## 2019-10-07 MED ORDER — FENTANYL CITRATE (PF) 100 MCG/2ML IJ SOLN: 25.0000 ug | INTRAMUSCULAR | Status: DC | PRN

## 2019-10-07 MED ORDER — DEXAMETHASONE SODIUM PHOSPHATE 4 MG/ML IJ SOLN
INTRAMUSCULAR | Status: DC | PRN
  Administered 2019-10-07: 5 mg via INTRAVENOUS

## 2019-10-07 MED ORDER — ACETAMINOPHEN 500 MG PO TABS
1000.0000 mg | ORAL_TABLET | ORAL | Status: AC
  Administered 2019-10-07: 1000 mg via ORAL

## 2019-10-07 MED ORDER — PROPOFOL 10 MG/ML IV BOLUS
INTRAVENOUS | Status: DC | PRN
  Administered 2019-10-07: 50 mg via INTRAVENOUS
  Administered 2019-10-07: 150 mg via INTRAVENOUS

## 2019-10-07 MED ORDER — CEFAZOLIN SODIUM-DEXTROSE 2-4 GM/100ML-% IV SOLN
INTRAVENOUS | Status: AC
  Filled 2019-10-07: qty 100

## 2019-10-07 MED ORDER — TECHNETIUM TC 99M SULFUR COLLOID FILTERED
1.0000 | Freq: Once | INTRAVENOUS | Status: AC | PRN
  Administered 2019-10-07: 1 via INTRADERMAL

## 2019-10-07 MED ORDER — PHENYLEPHRINE HCL (PRESSORS) 10 MG/ML IV SOLN
INTRAVENOUS | Status: DC | PRN
  Administered 2019-10-07 (×3): 80 ug via INTRAVENOUS

## 2019-10-07 MED ORDER — PROPOFOL 10 MG/ML IV BOLUS
INTRAVENOUS | Status: AC
  Filled 2019-10-07: qty 20

## 2019-10-07 MED ORDER — LACTATED RINGERS IV SOLN: INTRAVENOUS | Status: DC

## 2019-10-07 MED ORDER — GABAPENTIN 100 MG PO CAPS
ORAL_CAPSULE | ORAL | Status: AC
  Filled 2019-10-07: qty 1

## 2019-10-07 MED ORDER — EPHEDRINE SULFATE 50 MG/ML IJ SOLN
INTRAMUSCULAR | Status: DC | PRN
  Administered 2019-10-07: 20 mg via INTRAVENOUS
  Administered 2019-10-07: 10 mg via INTRAVENOUS
  Administered 2019-10-07: 20 mg via INTRAVENOUS
  Administered 2019-10-07: 10 mg via INTRAVENOUS

## 2019-10-07 MED ORDER — BUPIVACAINE HCL (PF) 0.25 % IJ SOLN
INTRAMUSCULAR | Status: DC | PRN
  Administered 2019-10-07: 5 mL

## 2019-10-07 MED ORDER — GABAPENTIN 100 MG PO CAPS
100.0000 mg | ORAL_CAPSULE | ORAL | Status: AC
  Administered 2019-10-07: 100 mg via ORAL

## 2019-10-07 MED ORDER — HEPARIN (PORCINE) IN NACL 1000-0.9 UT/500ML-% IV SOLN
INTRAVENOUS | Status: AC
  Filled 2019-10-07: qty 500

## 2019-10-07 MED ORDER — ONDANSETRON HCL 4 MG/2ML IJ SOLN
INTRAMUSCULAR | Status: DC | PRN
  Administered 2019-10-07: 4 mg via INTRAVENOUS

## 2019-10-07 SURGICAL SUPPLY — 47 items
ADH SKN CLS APL DERMABOND .7 (GAUZE/BANDAGES/DRESSINGS) ×1
APL PRP STRL LF DISP 70% ISPRP (MISCELLANEOUS) ×1
APPLIER CLIP 9.375 MED OPEN (MISCELLANEOUS) ×2
APR CLP MED 9.3 20 MLT OPN (MISCELLANEOUS) ×1
BINDER BREAST LRG (GAUZE/BANDAGES/DRESSINGS) IMPLANT
BINDER BREAST XLRG (GAUZE/BANDAGES/DRESSINGS) ×2 IMPLANT
BLADE SURG 15 STRL LF DISP TIS (BLADE) ×1 IMPLANT
BLADE SURG 15 STRL SS (BLADE) ×2
CHLORAPREP W/TINT 26 (MISCELLANEOUS) ×2 IMPLANT
CLIP APPLIE 9.375 MED OPEN (MISCELLANEOUS) ×1 IMPLANT
CLIP VESOCCLUDE SM WIDE 6/CT (CLIP) ×2 IMPLANT
COVER BACK TABLE 60X90IN (DRAPES) ×2 IMPLANT
COVER MAYO STAND STRL (DRAPES) ×2 IMPLANT
COVER PROBE W GEL 5X96 (DRAPES) ×2 IMPLANT
DERMABOND ADVANCED (GAUZE/BANDAGES/DRESSINGS) ×1
DERMABOND ADVANCED .7 DNX12 (GAUZE/BANDAGES/DRESSINGS) ×1 IMPLANT
DRAPE LAPAROSCOPIC ABDOMINAL (DRAPES) ×2 IMPLANT
DRAPE UTILITY XL STRL (DRAPES) ×2 IMPLANT
ELECT COATED BLADE 2.86 ST (ELECTRODE) ×2 IMPLANT
ELECT REM PT RETURN 9FT ADLT (ELECTROSURGICAL) ×2
ELECTRODE REM PT RTRN 9FT ADLT (ELECTROSURGICAL) ×1 IMPLANT
GLOVE BIO SURGEON STRL SZ 6.5 (GLOVE) ×2 IMPLANT
GLOVE BIO SURGEON STRL SZ7 (GLOVE) ×4 IMPLANT
GLOVE BIOGEL PI IND STRL 7.5 (GLOVE) ×1 IMPLANT
GLOVE BIOGEL PI INDICATOR 7.5 (GLOVE) ×1
GOWN STRL REUS W/ TWL LRG LVL3 (GOWN DISPOSABLE) ×2 IMPLANT
GOWN STRL REUS W/TWL LRG LVL3 (GOWN DISPOSABLE) ×4
KIT MARKER MARGIN INK (KITS) ×2 IMPLANT
NEEDLE HYPO 25X1 1.5 SAFETY (NEEDLE) ×2 IMPLANT
NS IRRIG 1000ML POUR BTL (IV SOLUTION) ×2 IMPLANT
PACK BASIN DAY SURGERY FS (CUSTOM PROCEDURE TRAY) ×2 IMPLANT
PENCIL SMOKE EVACUATOR (MISCELLANEOUS) ×2 IMPLANT
RETRACTOR ONETRAX LX 90X20 (MISCELLANEOUS) ×2 IMPLANT
SLEEVE SCD COMPRESS KNEE MED (MISCELLANEOUS) ×2 IMPLANT
SPONGE LAP 4X18 RFD (DISPOSABLE) ×2 IMPLANT
STRIP CLOSURE SKIN 1/2X4 (GAUZE/BANDAGES/DRESSINGS) ×2 IMPLANT
SUT MNCRL AB 4-0 PS2 18 (SUTURE) ×2 IMPLANT
SUT SILK 2 0 SH (SUTURE) ×2 IMPLANT
SUT VIC AB 2-0 SH 27 (SUTURE) ×4
SUT VIC AB 2-0 SH 27XBRD (SUTURE) ×2 IMPLANT
SUT VIC AB 3-0 SH 27 (SUTURE) ×4
SUT VIC AB 3-0 SH 27X BRD (SUTURE) ×2 IMPLANT
SYR CONTROL 10ML LL (SYRINGE) ×2 IMPLANT
TOWEL GREEN STERILE FF (TOWEL DISPOSABLE) ×2 IMPLANT
TRAY FAXITRON CT DISP (TRAY / TRAY PROCEDURE) ×2 IMPLANT
TUBE CONNECTING 20X1/4 (TUBING) ×2 IMPLANT
YANKAUER SUCT BULB TIP NO VENT (SUCTIONS) ×2 IMPLANT

## 2019-10-07 NOTE — Anesthesia Postprocedure Evaluation (Signed)
Anesthesia Post Note  Patient: Felicia [name]  Procedure(s) Performed: LEFT BREAST LUMPECTOMY WITH RADIOACTIVE SEED AND SENTINEL LYMPH NODE BIOPSY (Left Breast)     Patient location during evaluation: PACU Anesthesia Type: Regional and General Level of consciousness: awake Pain management: pain level controlled Vital Signs Assessment: post-procedure vital signs reviewed and stable Respiratory status: spontaneous breathing, nonlabored ventilation, respiratory function stable and patient connected to nasal cannula oxygen Cardiovascular status: blood pressure returned to baseline and stable Postop Assessment: no apparent nausea or vomiting Anesthetic complications: no   No complications documented.  Last Vitals:  Vitals:   10/07/19 1121 10/07/19 1209  BP:  135/71  Pulse: 88 90  Resp: 12 17  Temp:  36.9 C  SpO2: 96% 95%    Last Pain:  Vitals:   10/07/19 1209  PainSc: 0-No pain                 Kidada Ging P Rose Hegner

## 2019-10-07 NOTE — Discharge Instructions (Signed)
[facility] Office Phone Number [phone]  BREAST BIOPSY/ PARTIAL MASTECTOMY: POST OP INSTRUCTIONS Take 400 mg of ibuprofen every 8 hours or 650 mg tylenol every 6 hours for next 72 hours then as needed. Use ice several times daily also. Always review your discharge instruction sheet given to you by the facility where your surgery was performed.  IF YOU HAVE DISABILITY OR FAMILY LEAVE FORMS, YOU MUST BRING THEM TO THE OFFICE FOR PROCESSING.  DO NOT GIVE THEM TO YOUR DOCTOR.  1. A prescription for pain medication may be given to you upon discharge.  Take your pain medication as prescribed, if needed.  If narcotic pain medicine is not needed, then you may take acetaminophen (Tylenol), naprosyn (Alleve) or ibuprofen (Advil) as needed. 2. Take your usually prescribed medications unless otherwise directed 3. If you need a refill on your pain medication, please contact your pharmacy.  They will contact our office to request authorization.  Prescriptions will not be filled after 5pm or on week-ends. 4. You should eat very light the first 24 hours after surgery, such as soup, crackers, pudding, etc.  Resume your normal diet the day after surgery. 5. Most patients will experience some swelling and bruising in the breast.  Ice packs and a good support bra will help.  Wear the breast binder provided or a sports bra for 72 hours day and night.  After that wear a sports bra during the day until you return to the office. Swelling and bruising can take several days to resolve.  6. It is common to experience some constipation if taking pain medication after surgery.  Increasing fluid intake and taking a stool softener will usually help or prevent this problem from occurring.  A mild laxative (Milk of Magnesia or Miralax) should be taken according to package directions if there are no bowel movements after 48 hours. 7. Unless discharge instructions indicate otherwise, you may remove your bandages 48  hours after surgery and you may shower at that time.  You may have steri-strips (small skin tapes) in place directly over the incision.  These strips should be left on the skin for 7-10 days and will come off on their own.  If your surgeon used skin glue on the incision, you may shower in 24 hours.  The glue will flake off over the next 2-3 weeks.  Any sutures or staples will be removed at the office during your follow-up visit. 8. ACTIVITIES:  You may resume regular daily activities (gradually increasing) beginning the next day.  Wearing a good support bra or sports bra minimizes pain and swelling.  You may have sexual intercourse when it is comfortable. a. You may drive when you no longer are taking prescription pain medication, you can comfortably wear a seatbelt, and you can safely maneuver your car and apply brakes. b. RETURN TO WORK:  ______________________________________________________________________________________ 9. You should see your doctor in the office for a follow-up appointment approximately two weeks after your surgery.  Your doctor's nurse will typically make your follow-up appointment when she calls you with your pathology report.  Expect your pathology report 3-4 business days after your surgery.  You may call to check if you do not hear from us after three days. 10. OTHER INSTRUCTIONS: _______________________________________________________________________________________________ _____________________________________________________________________________________________________________________________________ _____________________________________________________________________________________________________________________________________ _____________________________________________________________________________________________________________________________________  WHEN TO CALL DR WAKEFIELD: 1. Fever over 101.0 2. Nausea and/or vomiting. 3. Extreme swelling or  bruising. 4. Continued bleeding from incision. 5. Increased pain, redness, or drainage from the incision.  The clinic   staff is available to answer your questions during regular business hours.  Please don't hesitate to call and ask to speak to one of the nurses for clinical concerns.  If you have a medical emergency, go to the nearest emergency room or call 911.  A surgeon from [name] is always on call at the hospital.  For further questions, please visit [email or web address]    Post Anesthesia Home Care Instructions  Activity: Get plenty of rest for the remainder of the day. A responsible individual must stay with you for 24 hours following the procedure.  For the next 24 hours, DO NOT: -Drive a car -Paediatric nurse -Drink alcoholic beverages -Take any medication unless instructed by your physician -Make any legal decisions or sign important papers.  Meals: Start with liquid foods such as gelatin or soup. Progress to regular foods as tolerated. Avoid greasy, spicy, heavy foods. If nausea and/or vomiting occur, drink only clear liquids until the nausea and/or vomiting subsides. Call your physician if vomiting continues.  Special Instructions/Symptoms: Your throat may feel dry or sore from the anesthesia or the breathing tube placed in your throat during surgery. If this causes discomfort, gargle with warm salt water. The discomfort should disappear within 24 hours.  If you had a scopolamine patch placed behind your ear for the management of post- operative nausea and/or vomiting:  1. The medication in the patch is effective for 72 hours, after which it should be removed.  Wrap patch in a tissue and discard in the trash. Wash hands thoroughly with soap and water. 2. You may remove the patch earlier than 72 hours if you experience unpleasant side effects which may include dry mouth, dizziness or visual disturbances. 3. Avoid touching the patch. Wash your  hands with soap and water after contact with the patch.   Regional Anesthesia Blocks  1. Numbness or the inability to move the "blocked" extremity may last from 3-48 hours after placement. The length of time depends on the medication injected and your individual response to the medication. If the numbness is not going away after 48 hours, call your surgeon.  2. The extremity that is blocked will need to be protected until the numbness is gone and the  Strength has returned. Because you cannot feel it, you will need to take extra care to avoid injury. Because it may be weak, you may have difficulty moving it or using it. You may not know what position it is in without looking at it while the block is in effect.  3. For blocks in the legs and feet, returning to weight bearing and walking needs to be done carefully. You will need to wait until the numbness is entirely gone and the strength has returned. You should be able to move your leg and foot normally before you try and bear weight or walk. You will need someone to be with you when you first try to ensure you do not fall and possibly risk injury.  4. Bruising and tenderness at the needle site are common side effects and will resolve in a few days.  5. Persistent numbness or new problems with movement should be communicated to the surgeon or the [place] [phone] [place] [phone]).  No tylenol until after 2:30 today if needed.

## 2019-10-07 NOTE — Op Note (Addendum)
Preoperative diagnosis: Clinical stage I left breast cancer  Postoperative diagnosis: Same as above Procedure: 1. left breast radioactive seed guided lumpectomy 2. left deep axillary sentinel lymph node biopsy  Surgeon: Dr. Serita Grammes Anesthesia: General Estimated blood loss: 10 cc Drains: None Complications: None Specimens: 1. left breast tissue marked with paint containing seed and clips 2. Additional left breast medial, inferior posterior margins marked short stitch superior, long stitch lateral, double stitch deep 3. left deep axillary sentinel lymph nodes with highest count of 469 Special count was correct completion Disposition to recovery stable condition   Indications:68 yof who has history uterine cancer in 2014 [place] care of in [place]. she has done well since then. she has had upper abdominal pain for some time that is worsening. she also had [place] mm that shows a luoq mass. no fh. she has a 1.6 cm mass on mm and on [place] has a 0.9x1.6x0.8 cm mass. axillary [place] is negative. 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 she is here to discuss options today. We elected to proceed with lumpectomy/sn biopsy.   Procedure: After informed consent was obtained the patient was first given antibiotics. SCDs were in place. She underwent a pec block. She was then placed under general anesthesia without complication. She was prepped and draped in the standard sterile surgical fashion. Surgical timeout was then performed.  I located the seed in the lateral portion of the breast. I infiltrated Marcaine all around this area and I made a curvilinear incision overlying the mass due to proximity to skin.  I then used the neoprobe to guide the excision of the mass and some surrounding tissue. I did a mammogram confirming removal of the seed and the clip. I reviewed the margins of the 3D imaging and I removed some additional margins as I thought some of the  residual mass was close.  I then placed clips in the cavity. The breast tissue was closed down with 2-0 vicryl. The skin was closed with 3-0 Vicryl and 4-0 Monocryl. Glue and Steri-Strips were applied.   I infiltrated marcaine in the axilla just below the hairline and made an incision.  then entered the axillary fascia. I identified the nodes containing radioactivity and excised them. The highest count is listed as above. I then was unable to really identify any background radioactivity. I obtained hemostasis . I then closed the axillary fascia with 2-0 Vicryl. The skin was closed with 3-0 Vicryl and 4-0 Monocryl. Glue and Steri-Strips were applied. She tolerated this well was extubated and transferred to recovery stable.

## 2019-10-07 NOTE — H&P (Signed)
69 yof who has history uterine cancer in 2014 [place] care of in [place]. she has done well since then. she has had upper abdominal pain for some time that is worsening. she also had [place] mm that shows a luoq mass. no fh. she has a 1.6 cm mass on mm and on [place] has a 0.9x1.6x0.8 cm mass. axillary [place] is negative. 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 she is interested in a reduction prior history of cardiac stent in 2017 in [place].   Past Surgical History Conni Slipper, RN; 09/08/2019 8:14 AM) Gallbladder Surgery - Laparoscopic  Hip Surgery  Left. Hysterectomy (due to cancer) - Complete  Spinal Surgery - Neck   Diagnostic Studies History Conni Slipper, RN; 09/08/2019 8:14 AM) Colonoscopy  1-5 years ago Mammogram  within last year Pap Smear  >5 years ago  Medication History Conni Slipper, RN; 09/08/2019 8:14 AM) Medications Reconciled  Social History Conni Slipper, RN; 09/08/2019 8:14 AM) Alcohol use  Occasional alcohol use. Caffeine use  Tea. No drug use  Tobacco use  Never smoker.  Family History Conni Slipper, RN; 09/08/2019 8:14 AM) Diabetes Mellitus  Family Members In General, Father, Sister.  Pregnancy / Birth History Conni Slipper, RN; 09/08/2019 8:14 AM) Age at menarche  69 years. Age of menopause  <44 Contraceptive History  Oral contraceptives. Gravida  2 Maternal age  70-30 Para  2 Regular periods   Other Problems Conni Slipper, RN; 09/08/2019 8:14 AM) Arthritis  Cancer  Diabetes Mellitus  Diverticulosis  Thyroid Disease     Review of Systems Conni Slipper RN; 09/08/2019 8:14 AM) General Present- Weight Gain. Not Present- Appetite Loss, Chills, Fatigue, Fever, Night Sweats and Weight Loss. Skin Present- Rash. Not Present- Change in Wart/Mole, Dryness, Hives, Jaundice, New Lesions, Non-Healing Wounds and Ulcer. HEENT Present- Seasonal Allergies and Wears  glasses/contact lenses. Not Present- Earache, Hearing Loss, Hoarseness, Nose Bleed, Oral Ulcers, Ringing in the Ears, Sinus Pain, Sore Throat, Visual Disturbances and Yellow Eyes. Respiratory Not Present- Bloody sputum, Chronic Cough, Difficulty Breathing, Snoring and Wheezing. Breast Not Present- Breast Mass, Breast Pain, Nipple Discharge and Skin Changes. Cardiovascular Not Present- Chest Pain, Difficulty Breathing Lying Down, Leg Cramps, Palpitations, Rapid Heart Rate, Shortness of Breath and Swelling of Extremities. Gastrointestinal Not Present- Abdominal Pain, Bloating, Bloody Stool, Change in Bowel Habits, Chronic diarrhea, Constipation, Difficulty Swallowing, Excessive gas, Gets full quickly at meals, Hemorrhoids, Indigestion, Nausea, Rectal Pain and Vomiting. Female Genitourinary Present- Urgency. Not Present- Frequency, Nocturia, Painful Urination and Pelvic Pain. Musculoskeletal Present- Back Pain. Not Present- Joint Pain, Joint Stiffness, Muscle Pain, Muscle Weakness and Swelling of Extremities. Neurological Present- Trouble walking. Not Present- Decreased Memory, Fainting, Headaches, Numbness, Seizures, Tingling, Tremor and Weakness. Psychiatric Not Present- Anxiety, Bipolar, Change in Sleep Pattern, Depression, Fearful and Frequent crying. Endocrine Present- Hot flashes. Not Present- Cold Intolerance, Excessive Hunger, Hair Changes, Heat Intolerance and New Diabetes. Hematology Present- Blood Thinners. Not Present- Easy Bruising, Excessive bleeding, Gland problems, HIV and Persistent Infections.   Physical Exam Rolm Bookbinder MD; 09/08/2019 2:29 PM) General Mental Status-Alert. Orientation-Oriented X3.  Breast Nipples-No Discharge. Breast Lump-No Palpable Breast Mass.  Lymphatic Head & Neck  General Head & Neck Lymphatics: Bilateral - Description - Normal. Axillary  General Axillary Region: Bilateral - Description - Normal. Note: no [place]  adenopathy     Assessment & Plan Rolm Bookbinder MD;  09/08/2019 2:32 PM) BREAST CANCER OF UPPER-OUTER QUADRANT OF LEFT FEMALE BREAST (C50.412) Story: Left breast seed guided lumpectomy, left ax sn biopsy, plastics for possible reduction (although I think proceeding is reasonable) We discussed the staging and pathophysiology of breast cancer. We discussed all of the different options for treatment for breast cancer including surgery, chemotherapy, radiation therapy and antiestrogens We discussed a sentinel lymph node biopsy as she does not appear to having lymph node involvement right now. We discussed the performance of that with injection of radioactive tracer. We discussed that there is a chance of having a positive node with a sentinel lymph node biopsy. We discussed up to a 5% risk lifetime of chronic shoulder pain as well as lymphedema associated with a sentinel lymph node biopsy. will do sozo measurements prior to surgery. We discussed the options for treatment of the breast cancer which included lumpectomy versus a mastectomy. We discussed the performance of the lumpectomy. We discussed a 5-10% chance of a positive margin requiring reexcision in the operating room. We also discussed that she may need radiation therapy if she undergoes lumpectomy. The breast cannot undergo more radiation therapy in the same breast after lumpectomy in the future. We discussed mastectomy and the postoperative care for that as well. Mastectomy can be followed by reconstruction. The decision for lumpectomy vs mastectomy has no impact on decision for chemotherapy. Most mastectomy patients will not need radiation therapy. We discussed that there is no difference in her survival whether she undergoes lumpectomy with radiation therapy or antiestrogen therapy versus a mastectomy. There is also no real difference between her recurrence in the breast. We discussed the risks of operation including bleeding, infection,  possible reoperation.

## 2019-10-07 NOTE — Progress Notes (Signed)
Nuc med inj performed by nuc med staff. No additional sedation required. Pt tol well. VSS

## 2019-10-07 NOTE — Anesthesia Procedure Notes (Signed)
Procedure Name: LMA Insertion Date/Time: 10/07/2019 9:37 AM Performed by: Bufford Spikes, CRNA Pre-anesthesia Checklist: Patient identified, Emergency Drugs available, Suction available and Patient being monitored Patient Re-evaluated:Patient Re-evaluated prior to induction Oxygen Delivery Method: Circle system utilized Preoxygenation: Pre-oxygenation with 100% oxygen Induction Type: IV induction Ventilation: Mask ventilation without difficulty LMA: LMA inserted LMA Size: 3.0 Number of attempts: 1 Airway Equipment and Method: Bite block Placement Confirmation: positive ETCO2 Tube secured with: Tape Dental Injury: Teeth and Oropharynx as per pre-operative assessment

## 2019-10-07 NOTE — Transfer of Care (Signed)
Immediate Anesthesia Transfer of Care Note  Patient: Felicia [name]  Procedure(s) Performed: LEFT BREAST LUMPECTOMY WITH RADIOACTIVE SEED AND SENTINEL LYMPH NODE BIOPSY (Left Breast)  Patient Location: PACU  Anesthesia Type:General  Level of Consciousness: awake, alert  and oriented  Airway & Oxygen Therapy: Patient Spontanous Breathing and Patient connected to nasal cannula oxygen  Post-op Assessment: Report given to RN and Post -op Vital signs reviewed and stable  Post vital signs: Reviewed and stable  Last Vitals:  Vitals Value Taken Time  BP 143/74 10/07/19 1037  Temp    Pulse 94 10/07/19 1038  Resp 19 10/07/19 1038  SpO2 97 % 10/07/19 1038  Vitals shown include unvalidated device data.  Last Pain:  Vitals:   10/07/19 0751  PainSc: 5       Patients Stated Pain Goal: 5 (82/95/62 1308)  Complications: No complications documented.

## 2019-10-07 NOTE — Progress Notes (Signed)
Assisted Dr. Ellender with left, ultrasound guided, pectoralis block. Side rails up, monitors on throughout procedure. See vital signs in flow sheet. Tolerated Procedure well. 

## 2019-10-07 NOTE — Interval H&P Note (Signed)
History and Physical Interval Note:  10/07/2019 9:25 AM  Felicia [name]  has presented today for surgery, with the diagnosis of LEFT BREAST CANCER.  The various methods of treatment have been discussed with the patient and family. After consideration of risks, benefits and other options for treatment, the patient has consented to  Procedure(s) with comments: LEFT BREAST LUMPECTOMY WITH RADIOACTIVE SEED AND SENTINEL LYMPH NODE BIOPSY (Left) - PEC BLOCK as a surgical intervention.  The patient's history has been reviewed, patient examined, no change in status, stable for surgery.  I have reviewed the patient's chart and labs.  Questions were answered to the patient's satisfaction.     Rolm Bookbinder

## 2019-10-07 NOTE — Anesthesia Procedure Notes (Signed)
Anesthesia Regional Block: Pectoralis block   Pre-Anesthetic Checklist: ,, timeout performed, Correct Patient, Correct Site, Correct Laterality, Correct Procedure, Correct Position, site marked, Risks and benefits discussed,  Surgical consent,  Pre-op evaluation,  At surgeon's request and post-op pain management  Laterality: Left  Prep: chloraprep       Needles:  Injection technique: Single-shot  Needle Type: Echogenic Stimulator Needle     Needle Length: 10cm  Needle Gauge: 20     Additional Needles:   Procedures:,,,, ultrasound used (permanent image in chart),,,,  Narrative:  Start time: 10/07/2019 8:30 AM End time: 10/07/2019 8:40 AM Injection made incrementally with aspirations every 5 mL.  Performed by: Personally  Anesthesiologist: Murvin Natal, MD  Additional Notes: Functioning IV was confirmed and monitors were applied.  A timeout was performed. Sterile prep, hand hygiene and sterile gloves were used. A 152mm 20ga BBraun echogenic stimulator needle was used. Negative aspiration and negative test dose prior to incremental administration of local anesthetic. The patient tolerated the procedure well.  Ultrasound guidance: relevent anatomy identified, needle position confirmed, local anesthetic spread visualized around nerve(s), vascular puncture avoided.  Image printed for medical record.

## 2019-10-08 ENCOUNTER — Encounter (HOSPITAL_BASED_OUTPATIENT_CLINIC_OR_DEPARTMENT_OTHER): Payer: Self-pay | Admitting: General Surgery

## 2019-10-11 ENCOUNTER — Other Ambulatory Visit: Payer: Self-pay | Admitting: Cardiovascular Disease

## 2019-10-11 NOTE — Addendum Note (Signed)
Addendum  created 10/11/19 1302 by Tawni Millers, CRNA   Charge Capture section accepted

## 2019-10-12 ENCOUNTER — Encounter: Payer: Self-pay | Admitting: *Deleted

## 2019-10-12 LAB — SURGICAL PATHOLOGY

## 2019-10-14 ENCOUNTER — Encounter: Payer: Self-pay | Admitting: *Deleted

## 2019-10-18 ENCOUNTER — Other Ambulatory Visit: Payer: Self-pay | Admitting: Oncology

## 2019-10-19 ENCOUNTER — Telehealth: Payer: Self-pay | Admitting: Oncology

## 2019-10-19 NOTE — Telephone Encounter (Signed)
Scheduled appt per 10/4 sch msg -unable to reach pt . Left message for patient with appt date and time

## 2019-10-21 NOTE — Progress Notes (Signed)
[place]  [phone] [phone]     ID: Sayge Brienza DOB: [birth year]  MR#: [id number]  HYW#:[id number]  Patient Care Team: Kathyrn Lass, MD as PCP - General (Family Medicine) Nahser, [name], MD as PCP - Cardiology (Cardiology) Mauro Kaufmann, RN as Oncology Nurse Navigator Rockwell Germany, RN as Oncology Nurse Navigator Rolm Bookbinder, MD as Consulting Physician (General Surgery) Man Bonneau, Virgie Dad, MD as Consulting Physician (Oncology) Eppie Gibson, MD as Attending Physician (Radiation Oncology) [name], MD as Consulting Physician (Rheumatology) [name], MD as Consulting Physician (Orthopedic Surgery) Janyth Pupa, DO as Consulting Physician (Obstetrics and Gynecology) Chauncey Cruel, MD OTHER MD:  CHIEF COMPLAINT: Triple positive invasive breast cancer  CURRENT TREATMENT: Trastuzumab, anti-estrogens   INTERVAL HISTORY: Donja returns today for follow up of her triple positive invasive breast cancer.    Since her last visit, she underwent left lumpectomy and sentinel lymph node biopsy on 10/07/2019 under Dr. Donne Hazel. Pathology from the procedure [id number]) showed: invasive ductal carcinoma, grade 2, 1.4 cm; ductal carcinoma in situ, high grade; margins uninvolved.  The single biopsied lymph node was positive for metastatic carcinoma (1/1).   REVIEW OF SYSTEMS: Eshika had a significant skin reaction to the ChloraPrep used for her surgery.  She tried all sorts of creams with very little success.  Eventually the rash cleared on its own although she still has a little bit of residual redness.  She is very eager to get back to work because she is concerned regarding electrical and water bills that she has to pay.  She wants to make sure that she will be able to drive herself home after treatment.  She is found another cousin who died from cancer, but she has not been able to ascertain what kind of cancer it was.   She is continuing to make inquiries of other family members.  Aside from this she continues to have significant peripheral neuropathy which is the baseline for her and her blood sugars currently are not well controlled.  HISTORY OF CURRENT ILLNESS: From the original intake note:  Annelyse Rey had routine screening mammography on 08/03/2019 showing a possible abnormality in the left breast. She underwent left diagnostic mammography with tomography and left breast ultrasonography at [facility] on 08/17/2019 showing: breast density category B; 1.6 cm upper-outer left breast mass; no significant left axillary abnormalities.  Accordingly on 08/30/2019 she proceeded to biopsy of the left breast area in question. The pathology from this procedure ([id number]) showed: invasive mammary carcinoma, e-cadherin positive, grade 2. Prognostic indicators significant for: estrogen receptor, 100% positive and progesterone receptor, 40% positive, both with strong staining intensity. Proliferation marker Ki67 at 20%. HER2 equivocal by immunohistochemistry (2+), but negative by fluorescent in situ hybridization with a signals ratio   and number per cell  .  Of note, she also has a history of FIGO grade 3, stage IA endometrial carcinoma, diagnosed in 2014 in [place]. She underwent robotic assisted total hysterectomy with BSO and pelvic and perirectal aortic lymph node dissection as well as omental biopsy.  Tumor was found only in the endometrium arising in a polyp measuring 1.7 cm.  There was no myometrial invasion no lymphovascular space invasion, and no lower uterine segment involvement.  The cervix and adnexa, lymph nodes and omentum were all not involved.  She then received vaginal brachytherapy, no chemotherapy, completing treatment in the springtime of 2015. [Per patient's report, chemotherapy was suggested, but she opted against this.]  The patient's subsequent  history is as detailed below.   PAST MEDICAL  HISTORY: Past Medical History:  Diagnosis Date  . Anemia   . Breast cancer ([place])   . CAD (coronary artery disease)    a. 03/2015 s/p DES to the LAD [facility]); b. 01/2017 MV: EF 80%, small, mild apical ant defect w/o ischemia (felt to be breast atten). Low risk.  . Depression   . DM (diabetes mellitus) ([place])   . Essential hypertension   . GERD (gastroesophageal reflux disease)   . Headache    secondary to neck surgery per patient  . High triglycerides   . Hyperlipidemia   . Hypothyroid   . OA (osteoarthritis)   . Overflow incontinence   . PONV (postoperative nausea and vomiting)    s/p gallbladder   . RLS (restless legs syndrome)   . Urinary urgency   . Uterine cancer ([place]) dx'd 2014    PAST SURGICAL HISTORY: Past Surgical History:  Procedure Laterality Date  . BREAST LUMPECTOMY WITH RADIOACTIVE SEED AND SENTINEL LYMPH NODE BIOPSY Left 10/07/2019   Procedure: LEFT BREAST LUMPECTOMY WITH RADIOACTIVE SEED AND SENTINEL LYMPH NODE BIOPSY;  Surgeon: Rolm Bookbinder, MD;  Location: [facility];  Service: General;  Laterality: Left;  . CORONARY STENT PLACEMENT    . CORONARY/GRAFT ACUTE MI REVASCULARIZATION    . FINGER ARTHRODESIS Right 06/11/2019   Procedure: ARTHRODESIS INDEX FINGER DISTAL PHALANGEAL JOINT;  Surgeon: [name], MD;  Location: [facility];  Service: Orthopedics;  Laterality: Right;  . FOOT SURGERY Left   . LEFT HEART CATH AND CORONARY ANGIOGRAPHY N/A 02/16/2018   Procedure: LEFT HEART CATH AND CORONARY ANGIOGRAPHY;  Surgeon: Lorretta Harp, MD;  Location: [facility];  Service: Cardiovascular;  Laterality: N/A;  . REPLACEMENT TOTAL KNEE Right   . SPINE SURGERY    . TOTAL ABDOMINAL HYSTERECTOMY     FOR UTERUS CANCER  . TOTAL HIP ARTHROPLASTY Left     FAMILY HISTORY: Family History  Problem Relation Age of Onset  . AAA (abdominal aortic aneurysm) Mother   . Diabetes Father   . Alzheimer's disease Father   . Throat  cancer Sister   . Breast cancer Cousin    Her father died at age 62 from Alzheimer's. Her mother died at age 79 from a ruptured abdominal aneurysm. Ki has 2 brothers and 3 sisters. She reports breast cancer in a paternal cousin. She also notes throat cancer in her sister.   GYNECOLOGIC HISTORY:  No LMP recorded. Patient has had a hysterectomy. Menarche: 69 years old Age at first live birth: 68 years old [place] P 2 LMP 1996 Contraceptive: prior use without issue HRT never used  Hysterectomy? Yes, 2014 BSO? 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  She unpacks 40 pounds boxes of chicken, has to cook them in 13 pound cooking pans, and is generally on her feet at work but does not otherwise exercise.  She is divorced. She lives at home by herself, with no pets. Daughter Pershing Proud, age 33, is a radiology tech in [place]. Daughter Martyn Malay, age 1, is an Optometrist in [place], [place]. Skylynn has one grandchild. She is not a Ambulance person.    ADVANCED DIRECTIVES: Not in place; intends to name daughter [name] as her HCPOA.   HEALTH MAINTENANCE: Social History   Tobacco Use  . Smoking status: Never Smoker  . Smokeless tobacco: Never Used  Vaping Use  .  Vaping Use: Never used  Substance Use Topics  . Alcohol use: Yes    Comment: OCCASIONALLY  . Drug use: No     Colonoscopy: <10 years ago, in [place]  PAP: 2014?  Bone density: date unsure, also in [place]   Allergies  Allergen Reactions  . Chloraprep One Step [Chlorhexidine Gluconate] Rash  . Estrogens Other (See Comments)    PATIENT HAS A HISTORY OF CANCER AND HAS BEEN TOLD TO NEVER TAKE ANYTHING CONTAINING ESTROGEN, AS IT MIGHT CAUSE A RECURRENCE  . Shrimp [Shellfish Allergy] Nausea And Vomiting    Current Outpatient Medications  Medication Sig Dispense Refill  . ACCU-CHEK GUIDE test strip 1 each by Other route as needed.    . Ascorbic Acid (VITAMIN C PO) Take 1  tablet by mouth daily.    Marland Kitchen aspirin EC 81 MG tablet Take 81 mg by mouth daily.    Marland Kitchen atorvastatin (LIPITOR) 40 MG tablet TAKE 1 TABLET BY MOUTH EVERY DAY 90 tablet 1  . bisoprolol (ZEBETA) 5 MG tablet Take 1 tablet (5 mg total) by mouth daily. 90 tablet 3  . Calcium Citrate-Vitamin D (CALCIUM + D PO) Take 1 tablet by mouth daily with supper.    . Calcium Polycarbophil (FIBERCON PO) Take 5 capsules by mouth daily.    . celecoxib (CELEBREX) 200 MG capsule Take 1 capsule by mouth 2 (two) times daily.    Marland Kitchen CINNAMON PO Take 1 capsule by mouth daily with supper.    . clotrimazole-betamethasone (LOTRISONE) cream Apply 1 application topically See admin instructions. APPLY A PEA-SIZED AMOUNT TWICE DAILY AS NEEDED/AS DIRECTED FOR 10 DAYS    . diclofenac sodium (VOLTAREN) 1 % GEL Apply 2-4 g topically 4 (four) times daily as needed (as directed for pain).    Marland Kitchen diclofenac Sodium (VOLTAREN) 1 % GEL Apply 1 application topically 3 (three) times daily as needed.    . Ferrous Sulfate (IRON PO) Take 1 tablet by mouth daily. alternates days:1 tablet one day and 2 tablets the next day    . GLIPIZIDE XL 10 MG 24 hr tablet Take 20 mg by mouth daily.   3  . HYDROcodone-acetaminophen (NORCO) 5-325 MG tablet Take 1-2 tablets by mouth every 6 (six) hours as needed for moderate pain. 1-2 tabs po q6 hours prn pain 10 tablet 0  . JARDIANCE 25 MG TABS tablet Take 25 mg by mouth every morning.   3  . levothyroxine (SYNTHROID, LEVOTHROID) 125 MCG tablet Take 125 mcg by mouth daily before breakfast.    . metFORMIN (GLUCOPHAGE) 500 MG tablet Take 2 tablets (1,000 mg total) by mouth See admin instructions. Take 1,000 mg by mouth 2 times a day- with breakfast and supper    . metroNIDAZOLE (METROGEL) 1 % gel Apply 1 application topically as needed (as directed to affected area).     . Misc Natural Products (OSTEO BI-FLEX ADV JOINT SHIELD PO) Take 1 tablet by mouth daily.    . Multiple Vitamins-Calcium (ONE-A-DAY WOMENS PO) Take 1  tablet by mouth daily.    . nitroGLYCERIN (NITROSTAT) 0.4 MG SL tablet Place 1 tablet (0.4 mg total) under the tongue every 5 (five) minutes as needed for chest pain. 25 tablet 6  . nortriptyline (PAMELOR) 25 MG capsule Take 25 mg by mouth at bedtime.   1  . omeprazole (PRILOSEC) 40 MG capsule Take 40 mg by mouth daily with supper.     Marland Kitchen rOPINIRole (REQUIP) 0.5 MG tablet Take 0.5 mg by mouth at  bedtime. 1-3 hours prior to bedtime    . tiZANidine (ZANAFLEX) 4 MG tablet Take 4 mg by mouth 3 (three) times daily. As needed    . triamcinolone ointment (KENALOG) 0.1 % Apply 1 application topically 2 (two) times daily as needed (for itching).    . TURMERIC PO Take 1 capsule by mouth daily with supper.     No current facility-administered medications for this visit.    OBJECTIVE: White woman who appears stated age  31:   10/22/19 1147  BP: 133/74  Pulse: 99  Resp: 18  Temp: (!) 97 F (36.1 C)  SpO2: 97%     Body mass index is 29.61 kg/m.   Wt Readings from Last 3 Encounters:  10/22/19 172 lb 8 oz (78.2 kg)  10/07/19 173 lb 8 oz (78.7 kg)  09/28/19 171 lb (77.6 kg)      ECOG FS:1 - Symptomatic but completely ambulatory  Sclerae unicteric, EOMs intact Wearing a mask No cervical or supraclavicular adenopathy Lungs no rales or rhonchi Heart regular rate and rhythm Abd soft, nontender, positive bowel sounds MSK no focal spinal tenderness, no upper extremity lymphedema Neuro: nonfocal, well oriented, appropriate affect Breasts: The right breast is unremarkable.  The left breast is status post lumpectomy.  The incisions are healing without dehiscence erythema or swelling.  Both axillae are benign Skin: There is a residual erythematous nonpalpable rash mostly between the breasts and slightly along the left axillary line   LAB RESULTS:  CMP     Component Value Date/Time   NA 139 10/04/2019 1330   NA 139 11/25/2018 1053   K 4.5 10/04/2019 1330   CL 103 10/04/2019 1330   CO2 23  10/04/2019 1330   GLUCOSE 237 (H) 10/04/2019 1330   BUN 13 10/04/2019 1330   BUN 18 11/25/2018 1053   CREATININE 0.63 10/04/2019 1330   CREATININE 0.73 09/08/2019 0811   CALCIUM 9.9 10/04/2019 1330   PROT 6.9 09/08/2019 0811   PROT 7.1 11/25/2018 1053   ALBUMIN 3.7 09/08/2019 0811   ALBUMIN 4.2 11/25/2018 1053   AST 18 09/08/2019 0811   ALT 33 09/08/2019 0811   ALKPHOS 55 09/08/2019 0811   BILITOT 0.3 09/08/2019 0811   GFRNONAA >60 10/04/2019 1330   GFRNONAA >60 09/08/2019 0811   GFRAA >60 10/04/2019 1330   GFRAA >60 09/08/2019 0811    No results found for: TOTALPROTELP, ALBUMINELP, A1GS, A2GS, BETS, BETA2SER, GAMS, MSPIKE, SPEI  Lab Results  Component Value Date   WBC 11.2 (H) 09/08/2019   NEUTROABS 3.0 09/08/2019   HGB 14.0 09/08/2019   HCT 43.6 09/08/2019   MCV 90.6 09/08/2019   PLT 261 09/08/2019    No results found for: LABCA2  No components found for: [id number]  No results for input(s): INR in the last 168 hours.  No results found for: LABCA2  No results found for: BLT903  No results found for: ESP233  No results found for: AQT622  No results found for: CA2729  No components found for: HGQUANT  No results found for: CEA1 / No results found for: CEA1   No results found for: AFPTUMOR  No results found for: CHROMOGRNA  No results found for: KPAFRELGTCHN, LAMBDASER, KAPLAMBRATIO (kappa/lambda light chains)  No results found for: HGBA, HGBA2QUANT, HGBFQUANT, HGBSQUAN (Hemoglobinopathy evaluation)   No results found for: LDH  No results found for: IRON, TIBC, IRONPCTSAT (Iron and TIBC)  No results found for: FERRITIN  Urinalysis No results found for: COLORURINE, APPEARANCEUR, [place], [place],  GLUCOSEU, HGBUR, BILIRUBINUR, KETONESUR, PROTEINUR, UROBILINOGEN, NITRITE, LEUKOCYTESUR   STUDIES: NM Sentinel Node Inj-No Rpt (Breast)  Result Date: 10/07/2019 Sulfur colloid was injected by the nuclear medicine technologist for melanoma sentinel  node.     ELIGIBLE FOR AVAILABLE RESEARCH PROTOCOL: AET  ASSESSMENT: 69 y.o. [place] woman status post left breast upper outer quadrant biopsy 08/30/2019 for a clinical T1c N0, stage IA invasive ductal carcinoma, grade 2, estrogen and progesterone receptor positive, HER-2 amplified, with an MIB-1 of 20%.  (0) status post TAH/BSO 2014 for a FIGO grade 3, stage IA endometrial carcinoma, status post vaginal brachytherapy, no adjuvant chemotherapy  (1) status post left lumpectomy and axillary sentinel node sampling 10/07/2019 for a pT1c pN1, stage IB invasive ductal carcinoma, grade 2, with negative margins.  (a) a total of 1 sentinel lymph node was removed.  (2) Herceptin immunotherapy to follow: SubQ requested, authorization pending  (a) baseline echo 09/15/2019 shows an ejection fraction in the 60-65% range.  (b) will not use paclitaxel secondary to severe peripheral neuropathy present at baseline  (3) adjuvant radiation to follow  (4) antiestrogens to start at the completion of local treatment   PLAN: Quinlynn 's situation is complex.  With a positive lymph node and HER-2 positive disease most patients would receive carboplatin, docetaxel, trastuzumab and Pertuzumab.  However I am concerned that she has poorly controlled diabetes and already has significant peripheral neuropathy.  I do not think we could give her docetaxel or even paclitaxel.  We might consider Abraxane since we do not need steroids for that but of course that also causes peripheral neuropathy.    She understands omitting chemotherapy is not the standard of care.  On the other hand I think with antiestrogens and anti-HER-2 treatment she is likely to have a very good long-term prognosis.  If we do not use chemotherapy she does not need a port and I am requesting approval for subQ trastuzumab.  She is scheduled for simulation 11/03/2019.  We can start her trastuzumab 11/04/2019 and I will see her that same day to make  sure that everything is in place.  She wanted me to fill out some papers that would allow her to return to work.  I do think she should not lift more than 15 pounds at a time and I put that in as a restriction.  Total encounter time 25 minutes.*.   Virgie Dad. Yobana Culliton, MD 10/22/2019 1:56 PM Medical Oncology and Hematology [facility] [place], [place] [address] Tel. [phone]    Fax. [phone]   This document serves as a record of services personally performed by Lurline Del, MD. It was created on his behalf by Wilburn Mylar, a trained medical scribe. The creation of this record is based on the scribe's personal observations and the provider's statements to them.   I, Lurline Del MD, have reviewed the above documentation for accuracy and completeness, and I agree with the above.   *Total Encounter Time as defined by the Centers for Medicare and Medicaid Services includes, in addition to the face-to-face time of a patient visit (documented in the note above) non-face-to-face time: obtaining and reviewing outside history, ordering and reviewing medications, tests or procedures, care coordination (communications with other health care professionals or caregivers) and documentation in the medical record.

## 2019-10-22 ENCOUNTER — Inpatient Hospital Stay: Payer: Medicare Other | Attending: Oncology | Admitting: Oncology

## 2019-10-22 ENCOUNTER — Encounter: Payer: Self-pay | Admitting: General Surgery

## 2019-10-22 ENCOUNTER — Other Ambulatory Visit: Payer: Self-pay

## 2019-10-22 VITALS — BP 133/74 | HR 99 | Temp 97.0°F | Resp 18 | Ht 64.0 in | Wt 172.5 lb

## 2019-10-22 DIAGNOSIS — C541 Malignant neoplasm of endometrium: Secondary | ICD-10-CM | POA: Diagnosis not present

## 2019-10-22 DIAGNOSIS — Z833 Family history of diabetes mellitus: Secondary | ICD-10-CM | POA: Diagnosis not present

## 2019-10-22 DIAGNOSIS — Z803 Family history of malignant neoplasm of breast: Secondary | ICD-10-CM | POA: Diagnosis not present

## 2019-10-22 DIAGNOSIS — I2510 Atherosclerotic heart disease of native coronary artery without angina pectoris: Secondary | ICD-10-CM | POA: Insufficient documentation

## 2019-10-22 DIAGNOSIS — Z79899 Other long term (current) drug therapy: Secondary | ICD-10-CM | POA: Diagnosis not present

## 2019-10-22 DIAGNOSIS — G629 Polyneuropathy, unspecified: Secondary | ICD-10-CM | POA: Insufficient documentation

## 2019-10-22 DIAGNOSIS — Z8249 Family history of ischemic heart disease and other diseases of the circulatory system: Secondary | ICD-10-CM | POA: Insufficient documentation

## 2019-10-22 DIAGNOSIS — Z170 Estrogen receptor positive status [ER+]: Secondary | ICD-10-CM | POA: Diagnosis not present

## 2019-10-22 DIAGNOSIS — Z801 Family history of malignant neoplasm of trachea, bronchus and lung: Secondary | ICD-10-CM | POA: Diagnosis not present

## 2019-10-22 DIAGNOSIS — C50412 Malignant neoplasm of upper-outer quadrant of left female breast: Secondary | ICD-10-CM | POA: Diagnosis present

## 2019-10-22 DIAGNOSIS — Z90722 Acquired absence of ovaries, bilateral: Secondary | ICD-10-CM | POA: Diagnosis not present

## 2019-10-22 DIAGNOSIS — E785 Hyperlipidemia, unspecified: Secondary | ICD-10-CM | POA: Insufficient documentation

## 2019-10-22 DIAGNOSIS — E119 Type 2 diabetes mellitus without complications: Secondary | ICD-10-CM | POA: Diagnosis not present

## 2019-10-25 ENCOUNTER — Other Ambulatory Visit: Payer: Self-pay

## 2019-10-25 ENCOUNTER — Encounter: Payer: Medicare Other | Attending: Family Medicine | Admitting: Registered"

## 2019-10-25 DIAGNOSIS — E119 Type 2 diabetes mellitus without complications: Secondary | ICD-10-CM | POA: Insufficient documentation

## 2019-10-25 NOTE — Progress Notes (Addendum)
Diabetes Self-Management Education  Visit Type: First/Initial  Appt. Start Time: 1540 Appt. End Time: 1645  11/03/2019  [name], identified by name and date of birth, is a 69 y.o. female with a diagnosis of Diabetes: Type 2.   ASSESSMENT  There were no vitals taken for this visit. There is no height or weight on file to calculate BMI.  Patient states she is having a hard time getting the right combination of diabetes medications and states she would like to see an endocrinologist to help her out. Pt states recently her fasting blood sugar was 254 mg/dL. Pt states she is not using Trulicity and her glipizide has increased. Per chart A1c on 08/27/19 was 8.2%  Patient states she needs a meal plan to help her know what she can eat and stay on track. Patient states she enjoys chocolate and uses coconut sugar because it is lower in carbs.   Pt reports that although she has a recent diagnosis of breast cancer and plans to start treatments, she will continue to work part-time. Patient states she has to work due to financial concerns. Pt states she was having a difficult time affording food, but has recently started receiving SNAP benefits and thinks she is okay for food now. (documented in [place])  Diabetes Self-Management Education - 11/03/19 1503      Visit Information   Visit Type First/Initial      Initial Visit   Diabetes Type Type 2    Are you currently following a meal plan? No    Are you taking your medications as prescribed? Yes   Metformin, gliipizide, jardiance     Health Coping   How would you rate your overall health? Fair      Psychosocial Assessment   Patient Belief/Attitude about Diabetes Defeat/Burnout      Complications   Last HgB A1C per patient/outside source 8.2 %    How often do you check your blood sugar? 1-2 times/day    Fasting Blood glucose range (mg/dL) >200      Exercise   Exercise Type ADL's      Patient Education   Previous Diabetes Education  Yes (please comment)    Nutrition management  Role of diet in the treatment of diabetes and the relationship between the three main macronutrients and blood glucose level;Meal options for control of blood glucose level and chronic complications.    Monitoring Identified appropriate SMBG and/or A1C goals.      Individualized Goals (developed by patient)   Nutrition Follow meal plan discussed      Outcomes   Expected Outcomes Demonstrated interest in learning. Expect positive outcomes    Future DMSE 2 months    Program Status Completed           Individualized Plan for Diabetes Self-Management Training:   Learning Objective:  Patient will have a greater understanding of diabetes self-management. Patient education plan is to attend individual and/or group sessions per assessed needs and concerns.   Patient Instructions  Consider calling your insurance company about if you can see an endocrinologist Consider looking for a support group or counselor to help you through the health challenges. Consider having protein bars that taste like chocolate. Try out the meal plan to see how that works for your blood sugar Consider testing some meals before and after to see how much the meal made your blood sugar go up.   Expected Outcomes:  Demonstrated interest in learning. Expect positive outcomes  Education material  provided: 7-day diabetes meal plan  If problems or questions, patient to contact team via:  Phone  Future DSME appointment: 2 months

## 2019-10-25 NOTE — Patient Instructions (Addendum)
Consider calling your insurance company about if you can see an endocrinologist Consider looking for a support group or counselor to help you through the health challenges. Consider having protein bars that taste like chocolate. Try out the meal plan to see how that works for your blood sugar Consider testing some meals before and after to see how much the meal made your blood sugar go up.

## 2019-10-28 ENCOUNTER — Telehealth: Payer: Self-pay

## 2019-10-28 NOTE — Telephone Encounter (Signed)
Pt called with question regarding appt. Attempted to call pt back, no answer, LVM.

## 2019-10-29 ENCOUNTER — Telehealth: Payer: Self-pay

## 2019-10-29 NOTE — Telephone Encounter (Signed)
Pt returned call. Looking for clarification on appts. Verified appt for 10/21 with pt; verbalized thanks and understanding.

## 2019-11-01 ENCOUNTER — Telehealth: Payer: Self-pay | Admitting: Oncology

## 2019-11-01 ENCOUNTER — Encounter: Payer: Self-pay | Admitting: Physical Therapy

## 2019-11-01 ENCOUNTER — Ambulatory Visit: Payer: Medicare Other | Attending: General Surgery | Admitting: Physical Therapy

## 2019-11-01 ENCOUNTER — Other Ambulatory Visit: Payer: Self-pay

## 2019-11-01 DIAGNOSIS — C50412 Malignant neoplasm of upper-outer quadrant of left female breast: Secondary | ICD-10-CM | POA: Diagnosis not present

## 2019-11-01 DIAGNOSIS — R293 Abnormal posture: Secondary | ICD-10-CM | POA: Insufficient documentation

## 2019-11-01 DIAGNOSIS — Z170 Estrogen receptor positive status [ER+]: Secondary | ICD-10-CM | POA: Diagnosis present

## 2019-11-01 DIAGNOSIS — Z483 Aftercare following surgery for neoplasm: Secondary | ICD-10-CM

## 2019-11-01 NOTE — Progress Notes (Signed)
Location of Breast Cancer: Malignant neoplasm of upper-outer quadrant of LEFT breast, estrogen receptor positive   Histology per Pathology Report:  10/07/2019 FINAL MICROSCOPIC DIAGNOSIS:  A. BREAST, LEFT, LUMPECTOMY:  - Invasive ductal carcinoma, Nottingham grade 2 of 3, 1.4 cm  - Ductal carcinoma in-situ, high grade  - Margins uninvolved by carcinoma (see parts C, D and E)  - Previous biopsy site changes present  - See oncology table and comment below  B. LYMPH NODE, LEFT AXILLARY, SENTINEL, EXCISION:  - Metastatic carcinoma involving one lymph node (1/1), 0.3 cm  - Focal extracapsular extension  C. BREAST, LEFT, ADDITIONAL MEDIAL MARGIN, EXCISION:  - Usual ductal hyperplasia  - No residual carcinoma identified  - See comment  D. BREAST, LEFT, ADDITIONAL INFERIOR MARGIN, EXCISION:  - No residual carcinoma identified  E. BREAST, LEFT, ADDITIONAL POSTERIOR MARGIN, EXCISION:  - No residual carcinoma identified  COMMENT:  A. The carcinoma has micropapillary features.  C.Cytokeratin 5/6 and E-cadherin are positive in the usual ductal  hyperplasia.  Receptor Status: ER(100%), PR (40%), Her2-neu (positive), Ki-67(20%)  Did patient present with symptoms (if so, please note symptoms) or was this found on screening mammography?:  Patient had a routine screening mammography on 08/03/2019 showing a possible abnormality in the left breast. She underwent left diagnostic mammography with tomography and left breast ultrasonography at [facility] on 08/17/2019 showing: breast density category B; 1.6 cm upper-outer left breast mass; no significant left axillary abnormalities  Past/Anticipated interventions by surgeon, if any: 10/07/2019 Dr. Rolm Bookbinder 1.leftbreast radioactive seed guided lumpectomy 2.leftdeep axillary sentinel lymph node biopsy   Past/Anticipated interventions by medical oncology, if any:  Under care of Dr. Sarajane Jews Magrinat 10/22/2019 (1) status post left  lumpectomy and axillary sentinel node sampling 10/07/2019 for a pT1c pN1, stage IB invasive ductal carcinoma, grade 2, with negative margins.             (a) a total of 1 sentinel lymph node was removed. (2) Herceptin immunotherapy to follow: SubQ requested, authorization pending             (a) baseline echo 09/15/2019 shows an ejection fraction in the 60-65% range.             (b) will not use paclitaxel secondary to severe peripheral neuropathy present at baseline (3) adjuvant radiation to follow (4) antiestrogens to start at the completion of local treatment  Lymphedema issues, if any:  Patient denies. She reports she went to PT this past Monday, and the therapist took measurements which showed no lymphedema     Pain issues, if any: Reports residual soreness/tenderness to left breast. She also deals with arthritis, and reports a generalized pain that typically is a 5 out of 10  SAFETY ISSUES:  Prior radiation? Yes--vaginal brachytherapy completed in 2015 (living in [place] at the time)  Pacemaker/ICD? No  Possible current pregnancy? No--hysterectomy 2014  Is the patient on methotrexate? No  Current Complaints / other details:  Patient concerned about on-going elevated blood sugars, and interested in a second opinion on how to manage. Resumed working last week (~20 hours).

## 2019-11-01 NOTE — Telephone Encounter (Signed)
Scheduled appt per 10/8 sch msg - left message with appt date and time

## 2019-11-01 NOTE — Patient Instructions (Signed)
            [facility]         [address], [place] [address]         [phone]         Annia Friendly, PT, CLT   After Breast Cancer Class It is recommended you attend the ABC class to be educated on lymphedema risk reduction. This class is free of charge and lasts for 1 hour. It is a 1-time class.  Since you don't have access to a device for a virtual class, I will give you the information and you can call me with questions.  Scar massage You can begin using coconut oil or vitamin E cream for your scars. Do gentle massage once a day for a few minutes on your incisions.   Home exercise Program Your range of motion is back to baseline. You should do the stretching as you go through radiation.   Follow up PT: It is recommended you return every 3 months for the first 3 years following surgery to be assessed on the SOZO machine for an L-Dex score. This helps prevent clinically significant lymphedema in 95% of patients. These follow up screens are 15 minute appointments that you are not billed for. You are scheduled for December 27th at 3:45.

## 2019-11-01 NOTE — Therapy (Signed)
[place], [place], [address] Phone: [phone]   Fax:  [phone]  Physical Therapy Treatment  Patient Details  Name: Felicia [name] MRN: [id number] Date of Birth: 09-24-1950 Referring Provider (PT): Dr. Rolm Bookbinder   Encounter Date: 11/01/2019   PT End of Session - 11/01/19 1432    Visit Number 2    Number of Visits 2    PT Start Time 1350    PT Stop Time 1434    PT Time Calculation (min) 44 min    Activity Tolerance Patient tolerated treatment well    Behavior During Therapy [facility] for tasks assessed/performed           Past Medical History:  Diagnosis Date  . Anemia   . Breast cancer ([place])   . CAD (coronary artery disease)    a. 03/2015 s/p DES to the LAD [facility]); b. 01/2017 MV: EF 80%, small, mild apical ant defect w/o ischemia (felt to be breast atten). Low risk.  . Depression   . DM (diabetes mellitus) ([place])   . Essential hypertension   . GERD (gastroesophageal reflux disease)   . Headache    secondary to neck surgery per patient  . High triglycerides   . Hyperlipidemia   . Hypothyroid   . OA (osteoarthritis)   . Overflow incontinence   . PONV (postoperative nausea and vomiting)    s/p gallbladder   . RLS (restless legs syndrome)   . Urinary urgency   . Uterine cancer ([place]) dx'd 2014    Past Surgical History:  Procedure Laterality Date  . BREAST LUMPECTOMY WITH RADIOACTIVE SEED AND SENTINEL LYMPH NODE BIOPSY Left 10/07/2019   Procedure: LEFT BREAST LUMPECTOMY WITH RADIOACTIVE SEED AND SENTINEL LYMPH NODE BIOPSY;  Surgeon: Rolm Bookbinder, MD;  Location: [facility];  Service: General;  Laterality: Left;  . CORONARY STENT PLACEMENT    . CORONARY/GRAFT ACUTE MI REVASCULARIZATION    . FINGER ARTHRODESIS Right 06/11/2019   Procedure: ARTHRODESIS INDEX FINGER DISTAL PHALANGEAL JOINT;  Surgeon: [name], MD;  Location: [employer];   Service: Orthopedics;  Laterality: Right;  . FOOT SURGERY Left   . LEFT HEART CATH AND CORONARY ANGIOGRAPHY N/A 02/16/2018   Procedure: LEFT HEART CATH AND CORONARY ANGIOGRAPHY;  Surgeon: Lorretta Harp, MD;  Location: [facility];  Service: Cardiovascular;  Laterality: N/A;  . REPLACEMENT TOTAL KNEE Right   . SPINE SURGERY    . TOTAL ABDOMINAL HYSTERECTOMY     FOR UTERUS CANCER  . TOTAL HIP ARTHROPLASTY Left     There were no vitals filed for this visit.   Subjective Assessment - 11/01/19 1356    Subjective Patient reports she underwent a left lumpectomy and sentinel node biopsy (1/1 positive node) on 10/07/2019. This is triple positive but due to her pre-existing peripheral neuropathy, she will not undergo chemotherapy but will undergo Herceptin and radiation.    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 She underwent a left lumpectomy and 1 positive node removed on 10/07/2019. Ki67 is 20%. She has a history of uterine cancer in 2014 treated with hysterectomy and radiation, a left hip replacement, right knee replacement, and a C4-C7 fusion in 2014.    Patient Stated Goals See how my arm is    Currently in Pain? Yes    Pain Score 5     Pain Location Breast  Pain Orientation Left    Pain Descriptors / Indicators Aching    Pain Type Surgical pain    Pain Onset 1 to 4 weeks ago    Pain Frequency Intermittent    Aggravating Factors  nothing    Pain Relieving Factors nothing              [facility] PT Assessment - 11/01/19 0001      Assessment   Medical Diagnosis s/p left lumpectomy and SLNB    Referring Provider (PT) Dr. Rolm Bookbinder    Onset Date/Surgical Date 10/07/19    Hand Dominance Right    Prior Therapy Baselines      Precautions   Precautions Other (comment)    Precaution Comments recent surgery      Restrictions   Weight Bearing Restrictions No      [place] residence    Living Arrangements Alone    Available Help at Discharge Family      Prior Function   Level of Independence Independent    Vocation Part time employment    Vocation Requirements Works part time at [employer] She does not exercise      Cognition   Overall Cognitive Status Within Functional Limits for tasks assessed      Observation/Other Assessments   Observations Both incisions appear to b healing well. The axillary incision is red on the inferior aspect with some mild tenderness. Photo taken (attached under media)      Posture/Postural Control   Posture/Postural Control Postural limitations    Postural Limitations Rounded Shoulders;Forward head      ROM / Strength   AROM / PROM / Strength AROM      AROM   AROM Assessment Site Shoulder    Right/Left Shoulder Left    Left Shoulder Extension 51 Degrees    Left Shoulder Flexion 147 Degrees    Left Shoulder ABduction 167 Degrees    Left Shoulder Internal Rotation 62 Degrees    Left Shoulder External Rotation 80 Degrees             LYMPHEDEMA/ONCOLOGY QUESTIONNAIRE - 11/01/19 0001      Type   Cancer Type Left breast cancer      Surgeries   Lumpectomy Date 10/07/19    Sentinel Lymph Node Biopsy Date 10/07/19    Number Lymph Nodes Removed 1      Treatment   Active Chemotherapy Treatment No    Past Chemotherapy Treatment No    Active Radiation Treatment No    Past Radiation Treatment No    Current Hormone Treatment No    Past Hormone Therapy No      What other symptoms do you have   Are you Having Heaviness or Tightness Yes    Are you having Pain Yes    Are you having pitting edema No    Is it Hard or Difficult finding clothes that fit No    Do you have infections No    Is there Decreased scar mobility No    [place]mer Sign No      Lymphedema Assessments   Lymphedema Assessments Upper extremities      Right Upper Extremity Lymphedema   10 cm Proximal to  Olecranon Process 25.6 cm    Olecranon Process 22.6 cm    10 cm Proximal to Ulnar Styloid Process 20.2 cm    Just Proximal to Ulnar Styloid Process 15.3 cm  Across Hand at [employer] 18.9 cm    At [place] of 2nd Digit 6.2 cm      Left Upper Extremity Lymphedema   10 cm Proximal to Olecranon Process 27.4 cm    Olecranon Process 23.2 cm    10 cm Proximal to Ulnar Styloid Process 20.3 cm    Just Proximal to Ulnar Styloid Process 15.6 cm    Across Hand at [employer] 18.7 cm    At [place] of 2nd Digit 6.3 cm              Quick Dash - 11/01/19 0001    Open a tight or new jar Moderate difficulty    Do heavy household chores (wash walls, wash floors) Moderate difficulty    Carry a shopping bag or briefcase No difficulty    Wash your back Mild difficulty    Use a knife to cut food No difficulty    Recreational activities in which you take some force or impact through your arm, shoulder, or hand (golf, hammering, tennis) No difficulty    During the past week, to what extent has your arm, shoulder or hand problem interfered with your normal social activities with family, friends, neighbors, or groups? Not at all    During the past week, to what extent has your arm, shoulder or hand problem limited your work or other regular daily activities Slightly    Arm, shoulder, or hand pain. Mild    Tingling (pins and needles) in your arm, shoulder, or hand None    Difficulty Sleeping Mild difficulty    DASH Score 18.18 %                          PT Education - 11/01/19 1432    Education Details Lymphedema education, HEP, scar massage, follow up care    Person(s) Educated Patient    Methods Explanation;Demonstration;Handout    Comprehension Returned demonstration;Verbalized understanding               PT Long Term Goals - 11/01/19 1458      PT LONG TERM GOAL #1   Title Patient will demonstrate she has regained full shoulder ROM and function post operatively  compared to baselines.    Time 8    Period Weeks    Status Achieved                 Plan - 11/01/19 1433    Clinical Impression Statement Patient is doing well s/p left lumpectomy and sentinel node biopsy on 10/07/2019. She had 1/1 positive axillary node removed. She will undergo Herceptin (unable to do chemotherapy due to pre-existing neuropathy) and radiation to include left breast and axilla. She has regained full shoulder ROM and function and her only complaint is some tenderness at her axillary incision which appears slightly red. Photo taken (see media). She will benefit from participating in the ABC class for lymphedema education but reports she does not do anything virtual so information was given today and she is to call with questions. Otherwise, there is no need for continued PT except for L-Dex screens every 3 months to detect subclinical lymphedema.    PT Treatment/Interventions ADLs/Self Care Home Management;Therapeutic exercise;Patient/family education    PT Next Visit Plan D/C    PT Home Exercise Plan Post op shoulder ROM HEP    Consulted and Agree with Plan of Care Patient  Patient will benefit from skilled therapeutic intervention in order to improve the following deficits and impairments:  Decreased knowledge of precautions, Postural dysfunction, Decreased range of motion, Impaired UE functional use, Pain, Decreased scar mobility  Visit Diagnosis: Malignant neoplasm of upper-outer quadrant of left breast in female, estrogen receptor positive (H[facility])  Abnormal posture  Aftercare following surgery for neoplasm     Problem List Patient Active Problem List   Diagnosis Date Noted  . Hepatic steatosis 09/28/2019  . Malignant neoplasm of upper-outer quadrant of left breast in female, estrogen receptor positive ([place]) 09/02/2019  . Endometrial cancer ([place]) 10/23/2018  . Cirrhosis of liver without ascites ([place]) 10/23/2018  . Diarrhea 10/23/2018  . Chest  pain 02/15/2018  . Coronary artery disease involving native coronary artery of native heart without angina pectoris 11/18/2017  . Hyperlipidemia LDL goal <70 11/18/2017  . Type 2 diabetes mellitus without complication, without long-term current use of insulin ([place]) 11/18/2017  . Dizziness 11/18/2017   PHYSICAL THERAPY DISCHARGE SUMMARY  Visits from Start of Care: 2  Current functional level related to goals / functional outcomes: Goals met. See above for objective findings.   Remaining deficits: none   Education / Equipment: Lymphedema education and HEP; scar massage  Plan: Patient agrees to discharge.  Patient goals were met. Patient is being discharged due to meeting the stated rehab goals.  ?????         Annia Friendly, [place] 11/01/19 3:00 PM  [place], [place], [address] Phone: [phone]   Fax:  [phone]  Name: Felicia [name] MRN: [id number] Date of Birth: 24-Sep-1950

## 2019-11-02 ENCOUNTER — Other Ambulatory Visit: Payer: Self-pay | Admitting: Cardiovascular Disease

## 2019-11-02 ENCOUNTER — Encounter: Payer: Self-pay | Admitting: Oncology

## 2019-11-02 ENCOUNTER — Other Ambulatory Visit: Payer: Self-pay | Admitting: *Deleted

## 2019-11-02 DIAGNOSIS — Z170 Estrogen receptor positive status [ER+]: Secondary | ICD-10-CM

## 2019-11-02 DIAGNOSIS — C50412 Malignant neoplasm of upper-outer quadrant of left female breast: Secondary | ICD-10-CM

## 2019-11-02 NOTE — Progress Notes (Signed)
Pt returned my call so I informed her of the [employer], went over what it covers and gave her the income requirement.  She would like to apply so she will bring proof of income on 11/04/19.  If approved I will give her an expense sheet and my card for any questions or concerns she may have in the future.

## 2019-11-02 NOTE — Progress Notes (Signed)
Called pt to introduce myself as her Financial Resource Specialist and to discuss the [employer] grant.  Unfortunately there aren't any foundations offering copay assistance for her Dx and the type of ins she has.  I left a msg requesting she return my call if she's interested in applying for the grant.  

## 2019-11-02 NOTE — Progress Notes (Signed)
Radiation Oncology         ([phone]) [phone] ________________________________  Name: Felicia [name] MRN: [id number]  Date: 11/03/2019  DOB: 1950-12-31  Follow-Up Visit Note  Outpatient  CC: Kathyrn Lass, MD  Magrinat, Virgie Dad, MD  Diagnosis:      ICD-10-CM   1. Malignant neoplasm of upper-outer quadrant of left breast in female, estrogen receptor positive [facility])  C50.412 Ambulatory referral to Social Work   Z17.0    Cancer Staging Malignant neoplasm of upper-outer quadrant of left breast in female, estrogen receptor positive ([place]) Staging form: Breast, AJCC 8th Edition - Clinical stage from 09/08/2019: Stage IA (cT1c, cN0, cM0, G2, ER+, PR+, HER2+) - Unsigned  pT1c pN1a  CHIEF COMPLAINT: Here to discuss management of left breast cancer  Narrative:  The patient returns today for follow-up. She was seen at the multidisciplinary breast conference on 09/08/2019, during which time it was recommended that she proceed with breat conserving surgery followed by Herceptin immunotherapy, adjuvant radiation, and antiestrogens.   Since consultation date, she underwent the following imaging (dates and results as follows): 1. Echocardiogram on 09/15/2019 that showed an EF of 60-65%. 2. CT scan of abdomen and pelvis on 09/16/2019 that did not show any intra-abdominal or intrapelvic findings to suggest recurrence or metastatic disease. It did, however, show stool throughout the colon that was suggestive of constipation, scattered sigmoid diverticulosis with no acute diverticulitis, hepatic steatosis, and aortic atherosclerosis.   Breast/nodal surgery on date of 10/07/2019 revealed: tumor size of 1.4 cm; histology of invasive ductal carcinoma with high grade ductal carcinoma in situ; margins uninvolved by carcinoma; nodal status of positive for metastatic carcinoma in 1/1 lymph node with focal extracapsular extension;  ER status: 100% strong; PR status: 40% strong; Her2 status: positive; Grade  2.  Systemic therapy, if applicable, involved (dates and therapy as follows): She is scheduled to begin Trastuzumab this week.  She reports she went to PT this past Monday, and the therapist took measurements which showed no lymphedema   -therapist noted some redness at her axillary scar and sent a photograph of it to Dr. Donne Hazel.  Pain issues, if any: Reports residual soreness/tenderness to left breast. She also deals with arthritis, and reports a generalized pain that typically is a 5 out of 10  SAFETY ISSUES:  Prior radiation? Yes--vaginal brachytherapy completed in 2015 (living in [place] at the time)  Pacemaker/ICD? No  Possible current pregnancy? No--hysterectomy 2014  Is the patient on methotrexate? No  Current Complaints / other details:   Resumed working last week (~20 hours).  She would like to go on a camping trip with her family after Thanksgiving and would be gone through Tuesday after Thanksgiving.  She is wondering if this would interfere with her radiation schedule.          ALLERGIES:  is allergic to chloraprep one step [chlorhexidine gluconate], estrogens, and shrimp [shellfish allergy].  Meds: Current Outpatient Medications  Medication Sig Dispense Refill  . ACCU-CHEK GUIDE test strip 1 each by Other route as needed.    . Ascorbic Acid (VITAMIN C PO) Take 1 tablet by mouth daily.    Marland Kitchen aspirin EC 81 MG tablet Take 81 mg by mouth daily.    Marland Kitchen atorvastatin (LIPITOR) 40 MG tablet TAKE 1 TABLET BY MOUTH EVERY DAY 90 tablet 1  . bisoprolol (ZEBETA) 5 MG tablet Take 1 tablet (5 mg total) by mouth daily. 90 tablet 3  . Calcium Citrate-Vitamin D (CALCIUM + D PO) Take 1  tablet by mouth daily with supper.    . Calcium Polycarbophil (FIBERCON PO) Take 5 capsules by mouth daily.    . celecoxib (CELEBREX) 200 MG capsule Take 1 capsule by mouth 2 (two) times daily.    Marland Kitchen CINNAMON PO Take 1 capsule by mouth daily with supper.    . clotrimazole-betamethasone (LOTRISONE)  cream Apply 1 application topically See admin instructions. APPLY A PEA-SIZED AMOUNT TWICE DAILY AS NEEDED/AS DIRECTED FOR 10 DAYS    . diclofenac sodium (VOLTAREN) 1 % GEL Apply 2-4 g topically 4 (four) times daily as needed (as directed for pain).    Marland Kitchen diclofenac Sodium (VOLTAREN) 1 % GEL Apply 1 application topically 3 (three) times daily as needed.    . Ferrous Sulfate (IRON PO) Take 1 tablet by mouth daily. alternates days:1 tablet one day and 2 tablets the next day    . GLIPIZIDE XL 10 MG 24 hr tablet Take 20 mg by mouth daily.   3  . HYDROcodone-acetaminophen (NORCO) 5-325 MG tablet Take 1-2 tablets by mouth every 6 (six) hours as needed for moderate pain. 1-2 tabs po q6 hours prn pain 10 tablet 0  . JARDIANCE 25 MG TABS tablet Take 25 mg by mouth every morning.   3  . levothyroxine (SYNTHROID, LEVOTHROID) 125 MCG tablet Take 125 mcg by mouth daily before breakfast.    . metFORMIN (GLUCOPHAGE) 500 MG tablet Take 2 tablets (1,000 mg total) by mouth See admin instructions. Take 1,000 mg by mouth 2 times a day- with breakfast and supper    . metroNIDAZOLE (METROGEL) 1 % gel Apply 1 application topically as needed (as directed to affected area).     . Misc Natural Products (OSTEO BI-FLEX ADV JOINT SHIELD PO) Take 1 tablet by mouth daily.    . Multiple Vitamins-Calcium (ONE-A-DAY WOMENS PO) Take 1 tablet by mouth daily.    . nitroGLYCERIN (NITROSTAT) 0.4 MG SL tablet Place 1 tablet (0.4 mg total) under the tongue every 5 (five) minutes as needed for chest pain. 25 tablet 6  . nortriptyline (PAMELOR) 25 MG capsule Take 25 mg by mouth at bedtime.   1  . omeprazole (PRILOSEC) 40 MG capsule Take 40 mg by mouth daily with supper.     Marland Kitchen rOPINIRole (REQUIP) 0.5 MG tablet Take 0.5 mg by mouth at bedtime. 1-3 hours prior to bedtime    . tiZANidine (ZANAFLEX) 4 MG tablet Take 4 mg by mouth 3 (three) times daily. As needed    . triamcinolone ointment (KENALOG) 0.1 % Apply 1 application topically 2 (two)  times daily as needed (for itching).    . TURMERIC PO Take 1 capsule by mouth daily with supper.     No current facility-administered medications for this encounter.    Physical Findings:  height is 5' 4"  (1.626 m) and weight is 174 lb 6.4 oz (79.1 kg). Her temperature is 98.1 F (36.7 C). Her blood pressure is 144/72 (abnormal) and her pulse is 74. Her respiration is 18 and oxygen saturation is 99%. .     General: Alert and oriented, in no acute distress Psychiatric: Judgment and insight are intact. Affect is appropriate. Breast exam reveals excellent healing of lateral lumpectomy scar, left breast.  Left axillary scar has some mild swelling and redness laterally but no oozing.  Lab Findings: Lab Results  Component Value Date   WBC 11.2 (H) 09/08/2019   HGB 14.0 09/08/2019   HCT 43.6 09/08/2019   MCV 90.6 09/08/2019   PLT  261 09/08/2019      Radiographic Findings: NM Sentinel Node Inj-No Rpt (Breast)  Result Date: 10/07/2019 Sulfur colloid was injected by the nuclear medicine technologist for melanoma sentinel node.    Impression/Plan: Left breast cancer  We discussed adjuvant radiotherapy today.  I recommend radiotherapy to the left breast and regional nodes in order to reduce risk of local regional recurrence by two thirds.  She understands that the alternative to radiation to the breast and lymph nodes will be mastectomy and axillary lymph node dissection.  She is interested in radiotherapy rather than pursuing more aggressive surgery.  I reviewed the logistics, benefits, risks, and potential side effects of radiotherapy in detail. Risks may include but not necessary be limited to acute and late injury tissue in the radiation fields such as skin irritation (change in color/pigmentation, itching, dryness, pain, peeling). She may experience fatigue. We also discussed possible risk of long term cosmetic changes or scar tissue. There is also a smaller risk for lung toxicity, cardiac  toxicity, brachial plexopathy, lymphedema, musculoskeletal changes, rib fragility or induction of a second malignancy, late chronic non-healing soft tissue wound.    The patient asked good questions which I answered to her satisfaction. She is enthusiastic about proceeding with treatment. A consent form has been  signed and placed in her chart.  We will proceed with CT simulation today and start her treatment next week.  We talked about the fact that she will get a 4-day weekend during the Thanksgiving holiday (Thursday through Sunday) but I recommend that she resume treatment the Monday after Thanksgiving.  Unfortunately, this would interfere with the camping trip that she was hoping to take with her grandchildren.  I empathized with her about how difficult it is to make sacrifices for cancer treatments and we talked about the pros and cons of going on a camping trip versus undergoing treatment without long interruptions.  She understands that radiation therapy is more likely to be efficacious if she does not take very long breaks in the middle of her treatment course.  She is agreeable to proceed with treatment as planned.  The patient was unaware of her appointment this Friday for Herceptin infusion.  She states that she wishes for this to be is rescheduled.  Nursing will call up to medical oncology about this.  On date of service, in total, I spent 30 minutes on this encounter. Patient was seen in person.  _____________________________________   Eppie Gibson, MD  This document serves as a record of services personally performed by Eppie Gibson, MD. It was created on his behalf by Clerance Lav, a trained medical scribe. The creation of this record is based on the scribe's personal observations and the provider's statements to them. This document has been checked and approved by the attending provider.

## 2019-11-03 ENCOUNTER — Encounter: Payer: Self-pay | Admitting: Radiation Oncology

## 2019-11-03 ENCOUNTER — Ambulatory Visit
Admission: RE | Admit: 2019-11-03 | Discharge: 2019-11-03 | Disposition: A | Discharge status: Home / Self Care | Payer: Medicare Other | Source: Ambulatory Visit | Attending: Radiation Oncology | Admitting: Radiation Oncology

## 2019-11-03 ENCOUNTER — Encounter: Payer: Self-pay | Admitting: Registered"

## 2019-11-03 ENCOUNTER — Other Ambulatory Visit: Payer: Self-pay

## 2019-11-03 ENCOUNTER — Ambulatory Visit: Payer: Medicare Other | Admitting: Oncology

## 2019-11-03 VITALS — BP 144/72 | HR 74 | Temp 98.1°F | Resp 18 | Ht 64.0 in | Wt 174.4 lb

## 2019-11-03 DIAGNOSIS — Z170 Estrogen receptor positive status [ER+]: Secondary | ICD-10-CM | POA: Diagnosis not present

## 2019-11-03 DIAGNOSIS — Z7982 Long term (current) use of aspirin: Secondary | ICD-10-CM | POA: Diagnosis not present

## 2019-11-03 DIAGNOSIS — Z79899 Other long term (current) drug therapy: Secondary | ICD-10-CM | POA: Insufficient documentation

## 2019-11-03 DIAGNOSIS — C50412 Malignant neoplasm of upper-outer quadrant of left female breast: Secondary | ICD-10-CM | POA: Insufficient documentation

## 2019-11-03 DIAGNOSIS — Z791 Long term (current) use of non-steroidal anti-inflammatories (NSAID): Secondary | ICD-10-CM | POA: Insufficient documentation

## 2019-11-03 NOTE — Progress Notes (Signed)
Felicia [name]  [phone] [phone]     ID: Felicia [name] DOB: 04-17-1950  MR#: [id number]  FGH#:[id number]  Patient Care Team: Felicia Lass, MD as PCP - General (Family Medicine) [name], Felicia Cheng, MD as PCP - Cardiology (Cardiology) Felicia Kaufmann, RN as Oncology Nurse Navigator Felicia Germany, RN as Oncology Nurse Navigator Felicia Bookbinder, MD as Consulting Physician (General Surgery) Felicia [name], Felicia Dad, MD as Consulting Physician (Oncology) Felicia Gibson, MD as Attending Physician (Radiation Oncology) Felicia Pound, MD as Consulting Physician (Rheumatology) Felicia Cover, MD as Consulting Physician (Orthopedic Surgery) Felicia Pupa, DO as Consulting Physician (Obstetrics and Gynecology) Felicia Cruel, MD OTHER MD:  CHIEF COMPLAINT: Triple positive invasive breast cancer  CURRENT TREATMENT: Trastuzumab, anti-estrogens   INTERVAL HISTORY: Felicia [name] returns today for follow up of her triple positive invasive breast cancer.  She is accompanied by her daughter  Since her last visit, she was referred back to Dr. Isidore [name] yesterday, 11/03/2019, to discuss radiation therapy. She underwent CT simulation that day in anticipation of beginning treatment on 11/10/2019. She is scheduled to receive therapy through 12/23/2019.  However today when we discussed again the final pathology from her left lumpectomy on 10/07/2019 (which showed a positive lymph node) she expressed interest in considering all options and we did discuss adjuvant chemotherapy.  She understands that this is standard of care in her situation even though there are some limitations that she has significant diabetic peripheral neuropathy.  She understands that her case was discussed again at the multidisciplinary breast cancer conference yesterday 11/03/2019 and that the group recommended chemotherapy.  At this time she is accepting of that option.Marland Kitchen    REVIEW OF SYSTEMS: Felicia [name] is a  little bit of a cough.  She does have a history of allergies.  She has no pleurisy, is producing no phlegm, and has had no fever.  She is not aware of any Covid exposure.  She continues to work full-time and would like all her treatments and visits to be on Wednesdays which are her day off.  A detailed review of systems today was otherwise stable   HISTORY OF CURRENT ILLNESS: From the original intake note:  Felicia [name] had routine screening mammography on 08/03/2019 showing a possible abnormality in the left breast. She underwent left diagnostic mammography with tomography and left breast ultrasonography at [facility] on 08/17/2019 showing: breast density category B; 1.6 cm upper-outer left breast mass; no significant left axillary abnormalities.  Accordingly on 08/30/2019 she proceeded to biopsy of the left breast area in question. The pathology from this procedure ([id number]) showed: invasive mammary carcinoma, e-cadherin positive, grade 2. Prognostic indicators significant for: estrogen receptor, 100% positive and progesterone receptor, 40% positive, both with strong staining intensity. Proliferation marker Ki67 at 20%. HER2 equivocal by immunohistochemistry (2+), but negative by fluorescent in situ hybridization with a signals ratio   and number per cell  .  Of note, she also has a history of FIGO grade 3, stage IA endometrial carcinoma, diagnosed in 2014 in [place]. She underwent robotic assisted total hysterectomy with BSO and pelvic and perirectal aortic lymph node dissection as well as omental biopsy.  Tumor was found only in the endometrium arising in a polyp measuring 1.7 cm.  There was no myometrial invasion no lymphovascular space invasion, and no lower uterine segment involvement.  The cervix and adnexa, lymph nodes and omentum were all not involved.  She then received vaginal brachytherapy, no chemotherapy, completing treatment in the springtime of 2015. [  Per patient's report, chemotherapy  was suggested, but she opted against this.]  The patient's subsequent history is as detailed below.   PAST MEDICAL HISTORY: Past Medical History:  Diagnosis Date  . Anemia   . Breast cancer (Felicia [name])   . CAD (coronary artery disease)    a. 03/2015 s/p DES to the LAD [facility]); b. 01/2017 MV: EF 80%, small, mild apical ant defect w/o ischemia (felt to be breast atten). Low risk.  . Depression   . DM (diabetes mellitus) ([place])   . Essential hypertension   . GERD (gastroesophageal reflux disease)   . Headache    secondary to neck surgery per patient  . High triglycerides   . Hyperlipidemia   . Hypothyroid   . OA (osteoarthritis)   . Overflow incontinence   . PONV (postoperative nausea and vomiting)    s/p gallbladder   . RLS (restless legs syndrome)   . Urinary urgency   . Uterine cancer ([place]) dx'd 2014    PAST SURGICAL HISTORY: Past Surgical History:  Procedure Laterality Date  . BREAST LUMPECTOMY WITH RADIOACTIVE SEED AND SENTINEL LYMPH NODE BIOPSY Left 10/07/2019   Procedure: LEFT BREAST LUMPECTOMY WITH RADIOACTIVE SEED AND SENTINEL LYMPH NODE BIOPSY;  Surgeon: Felicia Bookbinder, MD;  Location: [facility];  Service: General;  Laterality: Left;  . CORONARY STENT PLACEMENT    . CORONARY/GRAFT ACUTE MI REVASCULARIZATION    . FINGER ARTHRODESIS Right 06/11/2019   Procedure: ARTHRODESIS INDEX FINGER DISTAL PHALANGEAL JOINT;  Surgeon: Felicia Cover, MD;  Location: [place];  Service: Orthopedics;  Laterality: Right;  . FOOT SURGERY Left   . LEFT HEART CATH AND CORONARY ANGIOGRAPHY N/A 02/16/2018   Procedure: LEFT HEART CATH AND CORONARY ANGIOGRAPHY;  Surgeon: Lorretta Harp, MD;  Location: [facility];  Service: Cardiovascular;  Laterality: N/A;  . REPLACEMENT TOTAL KNEE Right   . SPINE SURGERY    . TOTAL ABDOMINAL HYSTERECTOMY     FOR UTERUS CANCER  . TOTAL HIP ARTHROPLASTY Left     FAMILY HISTORY: Family History  Problem Relation Age  of Onset  . AAA (abdominal aortic aneurysm) Mother   . Diabetes Father   . Alzheimer's disease Father   . Throat cancer Sister   . Breast cancer Cousin    Her father died at age 44 from Alzheimer's. Her mother died at age 2 from a ruptured abdominal aneurysm. Kimiyah has 2 brothers and 3 sisters. She reports breast cancer in a paternal cousin. She also notes throat cancer in her sister.   GYNECOLOGIC HISTORY:  No LMP recorded. Patient has had a hysterectomy. Menarche: 69 years old Age at first live birth: 69 years old [place] P 2 LMP 1996 Contraceptive: prior use without issue HRT never used  Hysterectomy? Yes, 2014 BSO? 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  She unpacks 40 pounds boxes of chicken, has to cook them in 13 [name] cooking pans, and is generally on her feet at work but does not otherwise exercise.  She is divorced. She lives at home by herself, with no pets. Daughter Pershing Proud, age 66, is a radiology tech in [place]. Daughter Martyn Malay, age 75, is an Optometrist in [place], [place]. Vivianne has one grandchild. She is not a Ambulance person.    ADVANCED DIRECTIVES: Not in place; intends to name daughter [name] as her HCPOA.   HEALTH MAINTENANCE: Social History   Tobacco Use  .  Smoking status: Never Smoker  . Smokeless tobacco: Never Used  Vaping Use  . Vaping Use: Never used  Substance Use Topics  . Alcohol use: Yes    Comment: OCCASIONALLY  . Drug use: No     Colonoscopy: <10 years ago, in [place]  PAP: 2014?  Bone density: date unsure, also in [place]   Allergies  Allergen Reactions  . Chloraprep One Step [Chlorhexidine Gluconate] Rash  . Estrogens Other (See Comments)    PATIENT HAS A HISTORY OF CANCER AND HAS BEEN TOLD TO NEVER TAKE ANYTHING CONTAINING ESTROGEN, AS IT MIGHT CAUSE A RECURRENCE  . Shrimp [Shellfish Allergy] Nausea And Vomiting    Current Outpatient Medications  Medication Sig  Dispense Refill  . ACCU-CHEK GUIDE test strip 1 each by Other route as needed.    . Ascorbic Acid (VITAMIN C PO) Take 1 tablet by mouth daily.    Marland Kitchen aspirin EC 81 MG tablet Take 81 mg by mouth daily.    Marland Kitchen atorvastatin (LIPITOR) 40 MG tablet TAKE 1 TABLET BY MOUTH EVERY DAY 90 tablet 1  . bisoprolol (ZEBETA) 5 MG tablet TAKE 1 TABLET BY MOUTH EVERY DAY 90 tablet 2  . Calcium Citrate-Vitamin D (CALCIUM + D PO) Take 1 tablet by mouth daily with supper.    . Calcium Polycarbophil (FIBERCON PO) Take 5 capsules by mouth daily.    . celecoxib (CELEBREX) 200 MG capsule Take 1 capsule by mouth 2 (two) times daily.    Marland Kitchen CINNAMON PO Take 1 capsule by mouth daily with supper.    . clotrimazole-betamethasone (LOTRISONE) cream Apply 1 application topically See admin instructions. APPLY A PEA-SIZED AMOUNT TWICE DAILY AS NEEDED/AS DIRECTED FOR 10 DAYS    . diclofenac sodium (VOLTAREN) 1 % GEL Apply 2-4 g topically 4 (four) times daily as needed (as directed for pain).    Marland Kitchen diclofenac Sodium (VOLTAREN) 1 % GEL Apply 1 application topically 3 (three) times daily as needed.    . Ferrous Sulfate (IRON PO) Take 1 tablet by mouth daily. alternates days:1 tablet one day and 2 tablets the next day    . GLIPIZIDE XL 10 MG 24 hr tablet Take 20 mg by mouth daily.   3  . HYDROcodone-acetaminophen (NORCO) 5-325 MG tablet Take 1-2 tablets by mouth every 6 (six) hours as needed for moderate pain. 1-2 tabs po q6 hours prn pain 10 tablet 0  . JARDIANCE 25 MG TABS tablet Take 25 mg by mouth every morning.   3  . levothyroxine (SYNTHROID, LEVOTHROID) 125 MCG tablet Take 125 mcg by mouth daily before breakfast.    . metFORMIN (GLUCOPHAGE) 500 MG tablet Take 2 tablets (1,000 mg total) by mouth See admin instructions. Take 1,000 mg by mouth 2 times a day- with breakfast and supper    . metroNIDAZOLE (METROGEL) 1 % gel Apply 1 application topically as needed (as directed to affected area).     . Misc Natural Products (OSTEO BI-FLEX ADV  JOINT SHIELD PO) Take 1 tablet by mouth daily.    . Multiple Vitamins-Calcium (ONE-A-DAY WOMENS PO) Take 1 tablet by mouth daily.    . nitroGLYCERIN (NITROSTAT) 0.4 MG SL tablet Place 1 tablet (0.4 mg total) under the tongue every 5 (five) minutes as needed for chest pain. 25 tablet 6  . nortriptyline (PAMELOR) 25 MG capsule Take 25 mg by mouth at bedtime.   1  . omeprazole (PRILOSEC) 40 MG capsule Take 40 mg by mouth daily with supper.     Marland Kitchen  rOPINIRole (REQUIP) 0.5 MG tablet Take 0.5 mg by mouth at bedtime. 1-3 hours prior to bedtime    . tiZANidine (ZANAFLEX) 4 MG tablet Take 4 mg by mouth 3 (three) times daily. As needed    . triamcinolone ointment (KENALOG) 0.1 % Apply 1 application topically 2 (two) times daily as needed (for itching).    . TURMERIC PO Take 1 capsule by mouth daily with supper.     No current facility-administered medications for this visit.    OBJECTIVE: White woman who appears stated age  15:   11/04/19 0946  BP: 140/81  Pulse: 76  Resp: 18  Temp: 97.7 F (36.5 C)  SpO2: 96%     Body mass index is 29.78 kg/m.   Wt Readings from Last 3 Encounters:  11/04/19 173 lb 8 oz (78.7 kg)  11/03/19 174 lb 6.4 oz (79.1 kg)  10/22/19 172 lb 8 oz (78.2 kg)      ECOG FS:1 - Symptomatic but completely ambulatory  Sclerae unicteric, EOMs intact Wearing a mask No cervical or supraclavicular adenopathy Lungs no rales or rhonchi Heart regular rate and rhythm Abd soft, nontender, positive bowel sounds MSK no focal spinal tenderness, no upper extremity lymphedema Neuro: nonfocal, well oriented, appropriate affect Breasts: Deferred   LAB RESULTS:  CMP     Component Value Date/Time   NA 138 11/04/2019 0844   NA 139 11/25/2018 1053   K 4.3 11/04/2019 0844   CL 104 11/04/2019 0844   CO2 20 (L) 11/04/2019 0844   GLUCOSE 289 (H) 11/04/2019 0844   BUN 18 11/04/2019 0844   BUN 18 11/25/2018 1053   CREATININE 0.82 11/04/2019 0844   CALCIUM 10.4 (H) 11/04/2019  0844   PROT 7.2 11/04/2019 0844   PROT 7.1 11/25/2018 1053   ALBUMIN 3.8 11/04/2019 0844   ALBUMIN 4.2 11/25/2018 1053   AST 18 11/04/2019 0844   ALT 29 11/04/2019 0844   ALKPHOS 57 11/04/2019 0844   BILITOT 0.3 11/04/2019 0844   GFRNONAA >60 11/04/2019 0844   GFRAA >60 10/04/2019 1330   GFRAA >60 09/08/2019 0811    No results found for: TOTALPROTELP, ALBUMINELP, A1GS, A2GS, BETS, BETA2SER, GAMS, MSPIKE, SPEI  Lab Results  Component Value Date   WBC 13.2 (H) 11/04/2019   NEUTROABS 4.0 11/04/2019   HGB 14.9 11/04/2019   HCT 46.7 (H) 11/04/2019   MCV 91.9 11/04/2019   PLT 204 11/04/2019    No results found for: LABCA2  No components found for: [id number]  No results for input(s): INR in the last 168 hours.  No results found for: LABCA2  No results found for: DXI338  No results found for: SNK539  No results found for: JQB341  No results found for: CA2729  No components found for: HGQUANT  No results found for: CEA1 / No results found for: CEA1   No results found for: AFPTUMOR  No results found for: CHROMOGRNA  No results found for: KPAFRELGTCHN, LAMBDASER, KAPLAMBRATIO (kappa/lambda light chains)  No results found for: HGBA, HGBA2QUANT, HGBFQUANT, HGBSQUAN (Hemoglobinopathy evaluation)   No results found for: LDH  No results found for: IRON, TIBC, IRONPCTSAT (Iron and TIBC)  No results found for: FERRITIN  Urinalysis No results found for: COLORURINE, APPEARANCEUR, LABSPEC, PHURINE, GLUCOSEU, HGBUR, BILIRUBINUR, KETONESUR, PROTEINUR, UROBILINOGEN, NITRITE, LEUKOCYTESUR   STUDIES: NM Sentinel Node Inj-No Rpt (Breast)  Result Date: 10/07/2019 Sulfur colloid was injected by the nuclear medicine technologist for melanoma sentinel node.     ELIGIBLE FOR AVAILABLE RESEARCH PROTOCOL:  AET  ASSESSMENT: 69 y.o. Felicia [name] woman status post left breast upper outer quadrant biopsy 08/30/2019 for a clinical T1c N0, stage IA invasive ductal carcinoma, grade 2,  estrogen and progesterone receptor positive, HER-2 amplified, with an MIB-1 of 20%.  (0) status post TAH/BSO 2014 for a FIGO grade 3, stage IA endometrial carcinoma, status post vaginal brachytherapy, no adjuvant chemotherapy  (1) status post left lumpectomy and axillary sentinel node sampling 10/07/2019 for a pT1c pN1, stage IB invasive ductal carcinoma, grade 2, with negative margins.  (a) a total of 1 sentinel lymph node was removed.  (2) adjuvant chemotherapy to consist of carboplatin, gemcitabine, trastuzumab  (a) baseline echo 09/15/2019 shows an ejection fraction in the 60-65% range.  (b) will not use docetaxel secondary to severe peripheral neuropathy present at baseline  (3) trastuzumab to be continued to complete a year  (4) adjuvant radiation to follow  (5) antiestrogens to start at the completion of local treatment   PLAN: I reviewed Jerricka's situation again with her and her daughter.  She understands that the standard of care in her situation especially with a positive lymph node is chemotherapy.  She has been very reluctant to receive this when we discussed the treatment plan specifically, which will be carboplatin, gemcitabine and trastuzumab given every 3 weeks (which would allow her to work) she was agreeable.  We reviewed the possible toxicities side effects and complications of these agents which were chosen given the patient's problems with severe diabetic neuropathy.  We need to avoid agents that might make the neuropathy worse or that might worsen the diabetes.  The target start date is November 10.  I will see her that day and she will have chemo teaching the week before  I have discussed the change in plans with Dr. Isidore [name] and radiation oncology.  Her radiation will be held until the end of chemotherapy which will be several months from now.  Unfortunately the simulation may need to be repeated.  Total encounter time 45 minutes.Sarajane Jews C. Jenny Lai, MD  11/04/2019 9:57 AM Medical Oncology and Hematology [facility] [place], [place] [address] Tel. [phone]    Fax. [phone]   This document serves as a record of services personally performed by Lurline Del, MD. It was created on his behalf by Wilburn Mylar, a trained medical scribe. The creation of this record is based on the scribe's personal observations and the provider's statements to them.   I, Lurline Del MD, have reviewed the above documentation for accuracy and completeness, and I agree with the above.   *Total Encounter Time as defined by the Centers for Medicare and Medicaid Services includes, in addition to the face-to-face time of a patient visit (documented in the note above) non-face-to-face time: obtaining and reviewing outside history, ordering and reviewing medications, tests or procedures, care coordination (communications with other health care professionals or caregivers) and documentation in the medical record.

## 2019-11-04 ENCOUNTER — Inpatient Hospital Stay: Payer: Medicare Other

## 2019-11-04 ENCOUNTER — Other Ambulatory Visit: Payer: Self-pay

## 2019-11-04 ENCOUNTER — Encounter: Payer: Self-pay | Admitting: Licensed Clinical Social Worker

## 2019-11-04 ENCOUNTER — Inpatient Hospital Stay (HOSPITAL_BASED_OUTPATIENT_CLINIC_OR_DEPARTMENT_OTHER): Payer: Medicare Other | Admitting: Oncology

## 2019-11-04 ENCOUNTER — Other Ambulatory Visit: Payer: Self-pay | Admitting: General Surgery

## 2019-11-04 ENCOUNTER — Encounter: Payer: Self-pay | Admitting: *Deleted

## 2019-11-04 ENCOUNTER — Telehealth: Payer: Self-pay

## 2019-11-04 VITALS — BP 140/81 | HR 76 | Temp 97.7°F | Resp 18 | Ht 64.0 in | Wt 173.5 lb

## 2019-11-04 DIAGNOSIS — C50412 Malignant neoplasm of upper-outer quadrant of left female breast: Secondary | ICD-10-CM | POA: Diagnosis not present

## 2019-11-04 DIAGNOSIS — Z170 Estrogen receptor positive status [ER+]: Secondary | ICD-10-CM

## 2019-11-04 LAB — CMP (CANCER CENTER ONLY)
ALT: 29 U/L (ref 0–44)
AST: 18 U/L (ref 15–41)
Albumin: 3.8 g/dL (ref 3.5–5.0)
Alkaline Phosphatase: 57 U/L (ref 38–126)
Anion gap: 14 (ref 5–15)
BUN: 18 mg/dL (ref 8–23)
CO2: 20 mmol/L — ABNORMAL LOW (ref 22–32)
Calcium: 10.4 mg/dL — ABNORMAL HIGH (ref 8.9–10.3)
Chloride: 104 mmol/L (ref 98–111)
Creatinine: 0.82 mg/dL (ref 0.44–1.00)
GFR, Estimated: >60 mL/min (ref >60)
Glucose, Bld: 289 mg/dL — ABNORMAL HIGH (ref 70–99)
Potassium: 4.3 mmol/L (ref 3.5–5.1)
Sodium: 138 mmol/L (ref 135–145)
Total Bilirubin: 0.3 mg/dL (ref 0.3–1.2)
Total Protein: 7.2 g/dL (ref 6.5–8.1)

## 2019-11-04 LAB — CBC WITH DIFFERENTIAL (CANCER CENTER ONLY)
Abs Immature Granulocytes: 0.04 10*3/uL (ref 0.00–0.07)
Basophils Absolute: 0.1 10*3/uL (ref 0.0–0.1)
Basophils Relative: 1 %
Eosinophils Absolute: 0.2 10*3/uL (ref 0.0–0.5)
Eosinophils Relative: 1 %
HCT: 46.7 % — ABNORMAL HIGH (ref 36.0–46.0)
Hemoglobin: 14.9 g/dL (ref 12.0–15.0)
Immature Granulocytes: 0 %
Lymphocytes Relative: 63 %
Lymphs Abs: 8.3 10*3/uL — ABNORMAL HIGH (ref 0.7–4.0)
MCH: 29.3 pg (ref 26.0–34.0)
MCHC: 31.9 g/dL (ref 30.0–36.0)
MCV: 91.9 fL (ref 80.0–100.0)
Monocytes Absolute: 0.7 10*3/uL (ref 0.1–1.0)
Monocytes Relative: 5 %
Neutro Abs: 4 10*3/uL (ref 1.7–7.7)
Neutrophils Relative %: 30 %
Platelet Count: 204 10*3/uL (ref 150–400)
RBC: 5.08 MIL/uL (ref 3.87–5.11)
RDW: 15.9 % — ABNORMAL HIGH (ref 11.5–15.5)
WBC Count: 13.2 10*3/uL — ABNORMAL HIGH (ref 4.0–10.5)
nRBC: 0 % (ref 0.0–0.2)

## 2019-11-04 NOTE — Progress Notes (Signed)
[facility] Psychosocial Distress Screening Clinical Social Work  Clinical Social Work was referred by distress screening protocol.  The patient scored a 7 on the Psychosocial Distress Thermometer which indicates moderate distress. Clinical Social Worker attempted to contact patient by phone to assess for distress and other psychosocial needs.  No answer. Left VM with direct contact information.  ONCBCN DISTRESS SCREENING 11/03/2019  Screening Type Initial Screening  Distress experienced in past week (1-10) 7  Practical problem type Housing;Work/school;Transportation  Emotional problem type Depression  Physical Problem type Pain  Physician notified of physical symptoms Yes  Referral to clinical psychology No  Referral to clinical social work Yes  Referral to dietition No  Referral to support programs Yes  Referral to palliative care No    Clinical Social Worker follow up needed: Yes.  Will make one more attempt to contact    Loretha Ure E, LCSW

## 2019-11-04 NOTE — Telephone Encounter (Signed)
-----   Message from Zola Button, RN sent at 11/03/2019 11:25 AM EDT ----- Regarding: RE: Patient wants infusion appt changed She does not want to come back in the afternoon tomorrow, so could someone call her later today to reschedule her Friday appointment to next week please? She started tearing up, and mentioned she's very upset with her experience/scheduling thus far.Marland KitchenMarland KitchenI think she would benefit from [place] or [place] reaching out to her as well.  Let me know if I can do anything else.   Thanks,  Katelin ----- Message ----- From: Gardiner Rhyme, RN Sent: 11/03/2019  11:04 AM EDT To: Scot Dock, RN, Zola Button, RN, # Subject: RE: Patient wants infusion appt changed        Althia Forts, Tomorrow is way too busy at 1030 for infusion but she can come back tomorrow afternoon at 1415 to get her infusion.  That is the best we can do or she can keep her Friday appt.  We could have cancels and keep her on our radar to fit her in at 14 but we won't know that until tomorrow.  Thanks for updating her. Masen Salvas ----- Message ----- From: Zola Button, RN Sent: 11/03/2019  10:51 AM EDT To: Scot Dock, RN, Gardiner Rhyme, RN, # Subject: Patient wants infusion appt changed            Morning,   Patient didn't realize that her infusion appointment was for Friday (she thought it was after her appointment with Dr. Jana Hakim on Thursday). She told Dr. Isidore Moos she will not be coming in on Friday, so we wanted to check if there is any way it could be moved to Thursday please? She is currently in Radiation for her XRT consult and then will be going across the hall for CT simulation, so I can update her with the new appointment time if it's able to be moved. If it can't be switched, could someone call her later today with other appointment options (I guess for next week) since she doesn't want to come in on Friday?  Thanks for your help! Katelin

## 2019-11-04 NOTE — Telephone Encounter (Signed)
Called Mrs. Mullen today about her experience yesterday.  She was very nice and stated that her plans had changed yesterday.  She stated she does not have chemo until 12/5 and she will not have radiation either.  I could not confirm in her notes that this was the plan.  I apologized for the inconvenience and the distress she had yesterday and I told her I would follow up with Dr. Jana Hakim and Dr. Isidore Moos about her plan going forward.  If it is going to change again, I told her we would let her know immediately. Forwarding message to Dr. Jana Hakim, his nurse and Dr. Pearlie Oyster nurse in case any more follow up needs to occur. Gardiner Rhyme, RN

## 2019-11-05 ENCOUNTER — Ambulatory Visit: Payer: Medicare Other

## 2019-11-05 ENCOUNTER — Other Ambulatory Visit: Payer: Self-pay | Admitting: Oncology

## 2019-11-05 MED ORDER — PROCHLORPERAZINE MALEATE 10 MG PO TABS: ORAL_TABLET | ORAL | 1 refills | Status: DC

## 2019-11-05 MED ORDER — LIDOCAINE-PRILOCAINE 2.5-2.5 % EX CREA: TOPICAL_CREAM | CUTANEOUS | 3 refills | Status: DC

## 2019-11-05 MED ORDER — ONDANSETRON HCL 8 MG PO TABS: 8.0000 mg | ORAL_TABLET | Freq: Two times a day (BID) | ORAL | 1 refills | Status: DC | PRN

## 2019-11-05 NOTE — Progress Notes (Signed)
[place] CSW Progress Note  Clinical Education officer, museum contacted patient by phone to follow-up on distress screen. Patient reports feeling better now that schedule is being sorted out. She is still financially stressed. 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 Has not looked into the the breast cancer foundations discussed at earlier appts but is open to having CSW meet with her after chemo ed on 11/3.  Patient is also aware of transportation benefit through insurance and program through [employer] and has contact numbers. She is also planning to bring paperwork to sign up for [employer].  Next appt: 11/3 after chemo education   Edwinna Areola Katha Kuehne , LCSW

## 2019-11-05 NOTE — Progress Notes (Signed)
DISCONTINUE OFF PATHWAY REGIMEN - Breast   [id number]:Trastuzumab and hyaluronidase-oysk 600 mg/10,000 units SUBQ D1 q21 Days:   A cycle is every 21 days:     Trastuzumab and hyaluronidase-oysk   **Always confirm dose/schedule in your pharmacy ordering system**  REASON: Change In Patient Status PRIOR TREATMENT: Off Pathway: Trastuzumab and hyaluronidase-oysk 600 mg/10,000 units SUBQ D1 q21 Days  START ON PATHWAY REGIMEN - Breast     A cycle is every 21 days:     Docetaxel      Carboplatin      Trastuzumab-xxxx      Trastuzumab-xxxx      Pertuzumab      Pertuzumab   **Always confirm dose/schedule in your pharmacy ordering system**  Patient Characteristics: Postoperative without Neoadjuvant Therapy (Pathologic Staging), Invasive Disease, Adjuvant Therapy, HER2 Positive, ER Positive, Node Positive, pT2, pN1a or Higher Therapeutic Status: Postoperative without Neoadjuvant Therapy (Pathologic Staging) AJCC Grade: G2 AJCC N Category: pN1 AJCC M Category: cM0 ER Status: Positive (+) AJCC 8 Stage Grouping: IB HER2 Status: Positive (+) Oncotype Dx Recurrence Score: Not Appropriate AJCC T Category: pT2 PR Status: Positive (+) Intent of Therapy: Curative Intent, Discussed with Patient 

## 2019-11-05 NOTE — Telephone Encounter (Signed)
Hi Felicia [name]-- [name] may be confused-- we changed plans after her meeting yesterday--but she IS going to receive chemo-- I just wrote the ordes-- and she will receive radiation afterwards. I think she's scheduled for the first week in NOV. Perhaps you could get the navigators to help clear this up  Thanks!  GM

## 2019-11-08 ENCOUNTER — Encounter: Payer: Self-pay | Admitting: *Deleted

## 2019-11-09 ENCOUNTER — Telehealth: Payer: Self-pay

## 2019-11-09 NOTE — Telephone Encounter (Signed)
Hi ladies,  Could one of you make sure Felicia [name] knows her treatment plan?  Thanks! Threasa Beards

## 2019-11-09 NOTE — Telephone Encounter (Signed)
Patient aware that radiation treatment will now occur after she receives chemotherapy, and that she will most likely have to be re-simmed prior to starting radiation. Patient aware of upcoming chemo education appointment and plans to bring new prescriptions from Dr. Jana Hakim to review with education nurse. Voiced appreciation of call, and denied any other needs at this time

## 2019-11-10 ENCOUNTER — Telehealth: Payer: Self-pay | Admitting: *Deleted

## 2019-11-10 ENCOUNTER — Other Ambulatory Visit: Payer: Self-pay | Admitting: Oncology

## 2019-11-10 ENCOUNTER — Ambulatory Visit: Payer: Medicare Other | Admitting: Radiation Oncology

## 2019-11-10 NOTE — Telephone Encounter (Signed)
Hello All, I spoke to her and clarified her treatment care plan and port placement. Thanks [employer]

## 2019-11-10 NOTE — Telephone Encounter (Signed)
Spoke to pt and discussed treatment care plan. Denies further questions or concerns regarding plan. Pt did express financial concerns. Referral placed for financial counseling. Encourage pt to call with needs. Received verbal understanding.

## 2019-11-11 ENCOUNTER — Ambulatory Visit: Payer: Medicare Other

## 2019-11-12 ENCOUNTER — Ambulatory Visit: Payer: Medicare Other

## 2019-11-15 ENCOUNTER — Ambulatory Visit: Payer: Medicare Other

## 2019-11-16 ENCOUNTER — Ambulatory Visit: Payer: Medicare Other

## 2019-11-16 ENCOUNTER — Other Ambulatory Visit: Payer: Self-pay

## 2019-11-16 ENCOUNTER — Encounter (HOSPITAL_BASED_OUTPATIENT_CLINIC_OR_DEPARTMENT_OTHER): Payer: Self-pay | Admitting: General Surgery

## 2019-11-17 ENCOUNTER — Inpatient Hospital Stay: Payer: Medicare Other | Attending: Oncology

## 2019-11-17 ENCOUNTER — Other Ambulatory Visit: Payer: Self-pay

## 2019-11-17 ENCOUNTER — Inpatient Hospital Stay: Payer: Medicare Other | Admitting: Licensed Clinical Social Worker

## 2019-11-17 ENCOUNTER — Ambulatory Visit: Payer: Medicare Other

## 2019-11-17 ENCOUNTER — Encounter: Payer: Self-pay | Admitting: Oncology

## 2019-11-17 DIAGNOSIS — Z170 Estrogen receptor positive status [ER+]: Secondary | ICD-10-CM

## 2019-11-17 DIAGNOSIS — C50412 Malignant neoplasm of upper-outer quadrant of left female breast: Secondary | ICD-10-CM

## 2019-11-17 DIAGNOSIS — Z818 Family history of other mental and behavioral disorders: Secondary | ICD-10-CM | POA: Insufficient documentation

## 2019-11-17 DIAGNOSIS — Z803 Family history of malignant neoplasm of breast: Secondary | ICD-10-CM | POA: Insufficient documentation

## 2019-11-17 DIAGNOSIS — I2510 Atherosclerotic heart disease of native coronary artery without angina pectoris: Secondary | ICD-10-CM | POA: Insufficient documentation

## 2019-11-17 DIAGNOSIS — Z8249 Family history of ischemic heart disease and other diseases of the circulatory system: Secondary | ICD-10-CM | POA: Insufficient documentation

## 2019-11-17 DIAGNOSIS — Z833 Family history of diabetes mellitus: Secondary | ICD-10-CM | POA: Insufficient documentation

## 2019-11-17 DIAGNOSIS — Z5112 Encounter for antineoplastic immunotherapy: Secondary | ICD-10-CM | POA: Insufficient documentation

## 2019-11-17 DIAGNOSIS — E785 Hyperlipidemia, unspecified: Secondary | ICD-10-CM | POA: Insufficient documentation

## 2019-11-17 DIAGNOSIS — Z801 Family history of malignant neoplasm of trachea, bronchus and lung: Secondary | ICD-10-CM | POA: Insufficient documentation

## 2019-11-17 DIAGNOSIS — Z90722 Acquired absence of ovaries, bilateral: Secondary | ICD-10-CM | POA: Insufficient documentation

## 2019-11-17 DIAGNOSIS — Z5111 Encounter for antineoplastic chemotherapy: Secondary | ICD-10-CM | POA: Insufficient documentation

## 2019-11-17 DIAGNOSIS — Z79899 Other long term (current) drug therapy: Secondary | ICD-10-CM | POA: Insufficient documentation

## 2019-11-17 DIAGNOSIS — E119 Type 2 diabetes mellitus without complications: Secondary | ICD-10-CM | POA: Insufficient documentation

## 2019-11-17 NOTE — Progress Notes (Signed)
The following biosimilar Udenyca ([id number]) has been selected for use in this patient.  Kennith Center, Pharm.D., CPP 11/17/2019@10 :59 AM

## 2019-11-17 NOTE — Progress Notes (Signed)
Pharmacist Chemotherapy Monitoring - Initial Assessment    Anticipated start date: 11/24/19   Regimen:  . Are orders appropriate based on the patient's diagnosis, regimen, and cycle? Yes . Does the plan date match the patient's scheduled date? Yes . Is the sequencing of drugs appropriate? Yes . Are the premedications appropriate for the patient's regimen? Yes . Prior Authorization for treatment is: Pending o If applicable, is the correct biosimilar selected based on the patient's insurance? yes  Organ Function and Labs: Marland Kitchen Are dose adjustments needed based on the patient's renal function, hepatic function, or hematologic function? No . Are appropriate labs ordered prior to the start of patient's treatment? Yes . Other organ system assessment, if indicated: trastuzumab: Echo/ MUGA . The following baseline labs, if indicated, have been ordered: N/A  Dose Assessment: . Are the drug doses appropriate? Yes . Are the following correct: o Drug concentrations Yes o IV fluid compatible with drug Yes o Administration routes Yes o Timing of therapy Yes . If applicable, does the patient have documented access for treatment and/or plans for port-a-cath placement? yes . If applicable, have lifetime cumulative doses been properly documented and assessed? yes Lifetime Dose Tracking  No doses have been documented on this patient for the following tracked chemicals: Doxorubicin, Epirubicin, Idarubicin, Daunorubicin, Mitoxantrone, Bleomycin, Oxaliplatin, Carboplatin, Liposomal Doxorubicin  o   Toxicity Monitoring/Prevention: . The patient has the following take home antiemetics prescribed: Ondansetron and Prochlorperazine . The patient has the following take home medications prescribed: N/A . Medication allergies and previous infusion related reactions, if applicable, have been reviewed and addressed. Yes . The patient's current medication list has been assessed for drug-drug interactions with their  chemotherapy regimen. no significant drug-drug interactions were identified on review.  Order Review: . Are the treatment plan orders signed? Yes . Is the patient scheduled to see a provider prior to their treatment? Yes  I verify that I have reviewed each item in the above checklist and answered each question accordingly.   Kennith Center, Pharm.D., CPP 11/17/2019@11 :52 AM

## 2019-11-17 NOTE — Progress Notes (Signed)
[facility] CSW Progress Note  Holiday representative met with patient for emotional and resource support. Patient having financial difficulties. Has SS Retirement income and works part-time, but bills are tight. CSW provided applications for breast cancer foundations and signed up pt for [employer] today.   Patient is also experiencing depression with adjustment to change in treatment plan (now needs chemo). She has experienced this at other times in her life as well. Currently trying to make herself stay busy (like going to work), finding things to look forward to (camping trip), and started on anti-depressant from PCP. CSW offered supportive counseling and will plan to see patient at next infusion appointments for ongoing support. Also referred to counseling intern to determine availability for counseling sessions.    Christeen Douglas LCSW

## 2019-11-17 NOTE — Progress Notes (Signed)
Pt is approved for the $1000 [employer] grant.  

## 2019-11-18 ENCOUNTER — Ambulatory Visit: Payer: Medicare Other

## 2019-11-19 ENCOUNTER — Telehealth: Payer: Self-pay

## 2019-11-19 ENCOUNTER — Other Ambulatory Visit (HOSPITAL_COMMUNITY)
Admission: RE | Admit: 2019-11-19 | Discharge: 2019-11-19 | Disposition: A | Discharge status: Home / Self Care | Payer: Medicare Other | Source: Ambulatory Visit | Attending: General Surgery | Admitting: General Surgery

## 2019-11-19 ENCOUNTER — Other Ambulatory Visit: Payer: Self-pay | Admitting: Oncology

## 2019-11-19 ENCOUNTER — Ambulatory Visit: Payer: Medicare Other

## 2019-11-19 ENCOUNTER — Encounter (HOSPITAL_BASED_OUTPATIENT_CLINIC_OR_DEPARTMENT_OTHER)
Admission: RE | Admit: 2019-11-19 | Discharge: 2019-11-19 | Disposition: A | Discharge status: Home / Self Care | Payer: Medicare Other | Source: Ambulatory Visit | Attending: General Surgery | Admitting: General Surgery

## 2019-11-19 DIAGNOSIS — Z01812 Encounter for preprocedural laboratory examination: Secondary | ICD-10-CM | POA: Diagnosis present

## 2019-11-19 DIAGNOSIS — Z20822 Contact with and (suspected) exposure to covid-19: Secondary | ICD-10-CM | POA: Diagnosis not present

## 2019-11-19 LAB — BASIC METABOLIC PANEL
Anion gap: 11 (ref 5–15)
BUN: 10 mg/dL (ref 8–23)
CO2: 25 mmol/L (ref 22–32)
Calcium: 9.8 mg/dL (ref 8.9–10.3)
Chloride: 102 mmol/L (ref 98–111)
Creatinine, Ser: 0.61 mg/dL (ref 0.44–1.00)
GFR, Estimated: >60 mL/min (ref >60)
Glucose, Bld: 240 mg/dL — ABNORMAL HIGH (ref 70–99)
Potassium: 5.2 mmol/L — ABNORMAL HIGH (ref 3.5–5.1)
Sodium: 138 mmol/L (ref 135–145)

## 2019-11-19 LAB — SARS CORONAVIRUS 2 (TAT 6-24 HRS): SARS Coronavirus 2: NEGATIVE

## 2019-11-19 NOTE — Progress Notes (Signed)

## 2019-11-19 NOTE — Telephone Encounter (Signed)
I called to see if patient was interested in scheduling counseling with me and we set up a session for Friday (11/12) at 9:15 am.   Gaylyn Rong Counseling Intern

## 2019-11-22 ENCOUNTER — Ambulatory Visit: Payer: Medicare Other

## 2019-11-22 NOTE — Progress Notes (Signed)
K+5.2, Dr. [name] aware, will proceed with surgery as scheduled.  °

## 2019-11-23 ENCOUNTER — Ambulatory Visit (HOSPITAL_COMMUNITY): Payer: Medicare Other

## 2019-11-23 ENCOUNTER — Ambulatory Visit (HOSPITAL_BASED_OUTPATIENT_CLINIC_OR_DEPARTMENT_OTHER): Payer: Medicare Other | Admitting: Anesthesiology

## 2019-11-23 ENCOUNTER — Ambulatory Visit: Payer: Medicare Other

## 2019-11-23 ENCOUNTER — Other Ambulatory Visit: Payer: Self-pay

## 2019-11-23 ENCOUNTER — Ambulatory Visit (HOSPITAL_BASED_OUTPATIENT_CLINIC_OR_DEPARTMENT_OTHER)
Admission: RE | Admit: 2019-11-23 | Discharge: 2019-11-23 | Disposition: A | Discharge status: Home / Self Care | Payer: Medicare Other | Attending: General Surgery | Admitting: General Surgery

## 2019-11-23 ENCOUNTER — Encounter (HOSPITAL_BASED_OUTPATIENT_CLINIC_OR_DEPARTMENT_OTHER)
Admission: RE | Disposition: A | Discharge status: Home / Self Care | Payer: Self-pay | Source: Home / Self Care | Attending: General Surgery

## 2019-11-23 ENCOUNTER — Encounter (HOSPITAL_BASED_OUTPATIENT_CLINIC_OR_DEPARTMENT_OTHER): Payer: Self-pay | Admitting: General Surgery

## 2019-11-23 DIAGNOSIS — C50912 Malignant neoplasm of unspecified site of left female breast: Secondary | ICD-10-CM | POA: Diagnosis present

## 2019-11-23 DIAGNOSIS — Z955 Presence of coronary angioplasty implant and graft: Secondary | ICD-10-CM | POA: Diagnosis not present

## 2019-11-23 DIAGNOSIS — I10 Essential (primary) hypertension: Secondary | ICD-10-CM | POA: Insufficient documentation

## 2019-11-23 DIAGNOSIS — I2510 Atherosclerotic heart disease of native coronary artery without angina pectoris: Secondary | ICD-10-CM | POA: Insufficient documentation

## 2019-11-23 DIAGNOSIS — Z8249 Family history of ischemic heart disease and other diseases of the circulatory system: Secondary | ICD-10-CM | POA: Insufficient documentation

## 2019-11-23 DIAGNOSIS — Z791 Long term (current) use of non-steroidal anti-inflammatories (NSAID): Secondary | ICD-10-CM | POA: Diagnosis not present

## 2019-11-23 DIAGNOSIS — Z7982 Long term (current) use of aspirin: Secondary | ICD-10-CM | POA: Insufficient documentation

## 2019-11-23 DIAGNOSIS — Z1501 Genetic susceptibility to malignant neoplasm of breast: Secondary | ICD-10-CM | POA: Diagnosis not present

## 2019-11-23 DIAGNOSIS — C50412 Malignant neoplasm of upper-outer quadrant of left female breast: Secondary | ICD-10-CM | POA: Insufficient documentation

## 2019-11-23 DIAGNOSIS — E119 Type 2 diabetes mellitus without complications: Secondary | ICD-10-CM | POA: Diagnosis not present

## 2019-11-23 DIAGNOSIS — Z803 Family history of malignant neoplasm of breast: Secondary | ICD-10-CM | POA: Insufficient documentation

## 2019-11-23 DIAGNOSIS — Z888 Allergy status to other drugs, medicaments and biological substances status: Secondary | ICD-10-CM | POA: Insufficient documentation

## 2019-11-23 DIAGNOSIS — K219 Gastro-esophageal reflux disease without esophagitis: Secondary | ICD-10-CM | POA: Diagnosis not present

## 2019-11-23 DIAGNOSIS — Z833 Family history of diabetes mellitus: Secondary | ICD-10-CM | POA: Diagnosis not present

## 2019-11-23 DIAGNOSIS — Z8542 Personal history of malignant neoplasm of other parts of uterus: Secondary | ICD-10-CM | POA: Insufficient documentation

## 2019-11-23 DIAGNOSIS — Z79899 Other long term (current) drug therapy: Secondary | ICD-10-CM | POA: Diagnosis not present

## 2019-11-23 DIAGNOSIS — Z91013 Allergy to seafood: Secondary | ICD-10-CM | POA: Insufficient documentation

## 2019-11-23 DIAGNOSIS — Z820 Family history of epilepsy and other diseases of the nervous system: Secondary | ICD-10-CM | POA: Diagnosis not present

## 2019-11-23 DIAGNOSIS — Z419 Encounter for procedure for purposes other than remedying health state, unspecified: Secondary | ICD-10-CM

## 2019-11-23 DIAGNOSIS — Z7984 Long term (current) use of oral hypoglycemic drugs: Secondary | ICD-10-CM | POA: Diagnosis not present

## 2019-11-23 HISTORY — PX: PORTACATH PLACEMENT: SHX2246

## 2019-11-23 HISTORY — DX: Anxiety disorder, unspecified: F41.9

## 2019-11-23 LAB — GLUCOSE, CAPILLARY
Glucose-Capillary: 156 mg/dL — ABNORMAL HIGH (ref 70–99)
Glucose-Capillary: 165 mg/dL — ABNORMAL HIGH (ref 70–99)

## 2019-11-23 SURGERY — INSERTION, TUNNELED CENTRAL VENOUS DEVICE, WITH PORT
Anesthesia: General | Site: Chest | Laterality: Right

## 2019-11-23 MED ORDER — HYDROMORPHONE HCL 1 MG/ML IJ SOLN
0.2500 mg | INTRAMUSCULAR | Status: DC | PRN
  Administered 2019-11-23: 0.25 mg via INTRAVENOUS

## 2019-11-23 MED ORDER — OXYCODONE HCL 5 MG PO TABS: 5.0000 mg | ORAL_TABLET | Freq: Once | ORAL | Status: DC | PRN

## 2019-11-23 MED ORDER — LIDOCAINE HCL (CARDIAC) PF 100 MG/5ML IV SOSY
PREFILLED_SYRINGE | INTRAVENOUS | Status: DC | PRN
  Administered 2019-11-23: 60 mg via INTRATRACHEAL

## 2019-11-23 MED ORDER — HEPARIN SOD (PORK) LOCK FLUSH 100 UNIT/ML IV SOLN
INTRAVENOUS | Status: AC
  Filled 2019-11-23: qty 5

## 2019-11-23 MED ORDER — ACETAMINOPHEN 500 MG PO TABS
ORAL_TABLET | ORAL | Status: AC
  Filled 2019-11-23: qty 2

## 2019-11-23 MED ORDER — HEPARIN (PORCINE) IN NACL 1000-0.9 UT/500ML-% IV SOLN
INTRAVENOUS | Status: AC
  Filled 2019-11-23: qty 500

## 2019-11-23 MED ORDER — CEFAZOLIN SODIUM-DEXTROSE 2-4 GM/100ML-% IV SOLN: 2.0000 g | INTRAVENOUS | Status: DC

## 2019-11-23 MED ORDER — PROPOFOL 10 MG/ML IV BOLUS
INTRAVENOUS | Status: AC
  Filled 2019-11-23: qty 20

## 2019-11-23 MED ORDER — BUPIVACAINE HCL (PF) 0.25 % IJ SOLN
INTRAMUSCULAR | Status: DC | PRN
  Administered 2019-11-23: 7 mL

## 2019-11-23 MED ORDER — ACETAMINOPHEN 500 MG PO TABS: 1000.0000 mg | ORAL_TABLET | ORAL | Status: DC

## 2019-11-23 MED ORDER — PROPOFOL 10 MG/ML IV BOLUS
INTRAVENOUS | Status: DC | PRN
  Administered 2019-11-23: 70 mg via INTRAVENOUS
  Administered 2019-11-23: 130 mg via INTRAVENOUS

## 2019-11-23 MED ORDER — GLYCOPYRROLATE PF 0.2 MG/ML IJ SOSY
PREFILLED_SYRINGE | INTRAMUSCULAR | Status: AC
  Filled 2019-11-23: qty 1

## 2019-11-23 MED ORDER — ACETAMINOPHEN 500 MG PO TABS
1000.0000 mg | ORAL_TABLET | Freq: Once | ORAL | Status: AC
  Administered 2019-11-23: 1000 mg via ORAL

## 2019-11-23 MED ORDER — CEFAZOLIN SODIUM-DEXTROSE 2-4 GM/100ML-% IV SOLN
INTRAVENOUS | Status: AC
  Filled 2019-11-23: qty 100

## 2019-11-23 MED ORDER — FENTANYL CITRATE (PF) 100 MCG/2ML IJ SOLN
INTRAMUSCULAR | Status: DC | PRN
  Administered 2019-11-23: 50 ug via INTRAVENOUS
  Administered 2019-11-23 (×2): 25 ug via INTRAVENOUS

## 2019-11-23 MED ORDER — ONDANSETRON HCL 4 MG/2ML IJ SOLN
INTRAMUSCULAR | Status: DC | PRN
  Administered 2019-11-23: 4 mg via INTRAVENOUS

## 2019-11-23 MED ORDER — HYDROMORPHONE HCL 1 MG/ML IJ SOLN
INTRAMUSCULAR | Status: AC
  Filled 2019-11-23: qty 0.5

## 2019-11-23 MED ORDER — OXYCODONE HCL 5 MG/5ML PO SOLN: 5.0000 mg | Freq: Once | ORAL | Status: DC | PRN

## 2019-11-23 MED ORDER — DEXAMETHASONE SODIUM PHOSPHATE 10 MG/ML IJ SOLN
INTRAMUSCULAR | Status: DC | PRN
  Administered 2019-11-23: 5 mg via INTRAVENOUS

## 2019-11-23 MED ORDER — SCOPOLAMINE 1 MG/3DAYS TD PT72
MEDICATED_PATCH | TRANSDERMAL | Status: AC
  Filled 2019-11-23: qty 1

## 2019-11-23 MED ORDER — LACTATED RINGERS IV SOLN: INTRAVENOUS | Status: DC

## 2019-11-23 MED ORDER — HEPARIN SOD (PORK) LOCK FLUSH 100 UNIT/ML IV SOLN
INTRAVENOUS | Status: DC | PRN
  Administered 2019-11-23: 500 [IU]

## 2019-11-23 MED ORDER — EPHEDRINE SULFATE 50 MG/ML IJ SOLN
INTRAMUSCULAR | Status: DC | PRN
  Administered 2019-11-23 (×2): 5 mg via INTRAVENOUS
  Administered 2019-11-23 (×2): 10 mg via INTRAVENOUS

## 2019-11-23 MED ORDER — FENTANYL CITRATE (PF) 100 MCG/2ML IJ SOLN
INTRAMUSCULAR | Status: AC
  Filled 2019-11-23: qty 2

## 2019-11-23 MED ORDER — HEPARIN (PORCINE) IN NACL 2-0.9 UNITS/ML
INTRAMUSCULAR | Status: AC | PRN
  Administered 2019-11-23: 1

## 2019-11-23 MED ORDER — ONDANSETRON HCL 4 MG/2ML IJ SOLN
INTRAMUSCULAR | Status: AC
  Filled 2019-11-23: qty 2

## 2019-11-23 MED ORDER — CEFAZOLIN SODIUM-DEXTROSE 2-3 GM-%(50ML) IV SOLR
INTRAVENOUS | Status: DC | PRN
  Administered 2019-11-23: 2 g via INTRAVENOUS

## 2019-11-23 MED ORDER — BUPIVACAINE HCL (PF) 0.25 % IJ SOLN
INTRAMUSCULAR | Status: AC
  Filled 2019-11-23: qty 30

## 2019-11-23 MED ORDER — GLYCOPYRROLATE 0.2 MG/ML IJ SOLN
INTRAMUSCULAR | Status: DC | PRN
  Administered 2019-11-23: .2 mg via INTRAVENOUS

## 2019-11-23 MED ORDER — SCOPOLAMINE 1 MG/3DAYS TD PT72
1.0000 | MEDICATED_PATCH | TRANSDERMAL | Status: DC
  Administered 2019-11-23: 1.5 mg via TRANSDERMAL

## 2019-11-23 MED ORDER — PROMETHAZINE HCL 25 MG/ML IJ SOLN: 6.2500 mg | INTRAMUSCULAR | Status: DC | PRN

## 2019-11-23 SURGICAL SUPPLY — 53 items
ADH SKN CLS APL DERMABOND .7 (GAUZE/BANDAGES/DRESSINGS) ×1
APL PRP STRL LF DISP 70% ISPRP (MISCELLANEOUS)
APL SKNCLS STERI-STRIP NONHPOA (GAUZE/BANDAGES/DRESSINGS) ×1
BAG DECANTER FOR FLEXI CONT (MISCELLANEOUS) ×2 IMPLANT
BENZOIN TINCTURE PRP APPL 2/3 (GAUZE/BANDAGES/DRESSINGS) ×2 IMPLANT
BLADE SURG 11 STRL SS (BLADE) ×2 IMPLANT
BLADE SURG 15 STRL LF DISP TIS (BLADE) ×1 IMPLANT
BLADE SURG 15 STRL SS (BLADE) ×2
CANISTER SUCT 1200ML W/VALVE (MISCELLANEOUS) IMPLANT
CHLORAPREP W/TINT 26 (MISCELLANEOUS) IMPLANT
COVER BACK TABLE 60X90IN (DRAPES) ×2 IMPLANT
COVER MAYO STAND STRL (DRAPES) ×2 IMPLANT
COVER PROBE 5X48 (MISCELLANEOUS)
COVER WAND RF STERILE (DRAPES) IMPLANT
DECANTER SPIKE VIAL GLASS SM (MISCELLANEOUS) IMPLANT
DERMABOND ADVANCED (GAUZE/BANDAGES/DRESSINGS) ×1
DERMABOND ADVANCED .7 DNX12 (GAUZE/BANDAGES/DRESSINGS) ×1 IMPLANT
DRAPE C-ARM 42X72 X-RAY (DRAPES) ×2 IMPLANT
DRAPE LAPAROSCOPIC ABDOMINAL (DRAPES) ×2 IMPLANT
DRAPE UTILITY XL STRL (DRAPES) ×2 IMPLANT
DRSG TEGADERM 4X4.75 (GAUZE/BANDAGES/DRESSINGS) IMPLANT
ELECT COATED BLADE 2.86 ST (ELECTRODE) ×2 IMPLANT
ELECT REM PT RETURN 9FT ADLT (ELECTROSURGICAL) ×2
ELECTRODE REM PT RTRN 9FT ADLT (ELECTROSURGICAL) ×1 IMPLANT
GAUZE SPONGE 4X4 12PLY STRL LF (GAUZE/BANDAGES/DRESSINGS) ×2 IMPLANT
GLOVE BIO SURGEON STRL SZ7 (GLOVE) ×4 IMPLANT
GLOVE BIOGEL PI IND STRL 7.5 (GLOVE) ×1 IMPLANT
GLOVE BIOGEL PI INDICATOR 7.5 (GLOVE) ×1
GOWN STRL REUS W/ TWL LRG LVL3 (GOWN DISPOSABLE) ×2 IMPLANT
GOWN STRL REUS W/TWL LRG LVL3 (GOWN DISPOSABLE) ×4
IV KIT MINILOC 20X1 SAFETY (NEEDLE) IMPLANT
KIT CVR 48X5XPRB PLUP LF (MISCELLANEOUS) IMPLANT
KIT PORT POWER 8FR ISP CVUE (Port) ×2 IMPLANT
NDL SAFETY ECLIPSE 18X1.5 (NEEDLE) IMPLANT
NEEDLE HYPO 18GX1.5 SHARP (NEEDLE)
NEEDLE HYPO 25X1 1.5 SAFETY (NEEDLE) ×2 IMPLANT
PACK BASIN DAY SURGERY FS (CUSTOM PROCEDURE TRAY) ×2 IMPLANT
PENCIL SMOKE EVACUATOR (MISCELLANEOUS) ×2 IMPLANT
SLEEVE SCD COMPRESS KNEE MED (MISCELLANEOUS) ×2 IMPLANT
STAPLER VISISTAT 35W (STAPLE) ×2 IMPLANT
STRIP CLOSURE SKIN 1/2X4 (GAUZE/BANDAGES/DRESSINGS) ×2 IMPLANT
SUT MNCRL AB 4-0 PS2 18 (SUTURE) ×2 IMPLANT
SUT PROLENE 2 0 SH DA (SUTURE) ×2 IMPLANT
SUT SILK 2 0 TIES 17X18 (SUTURE)
SUT SILK 2-0 18XBRD TIE BLK (SUTURE) IMPLANT
SUT VIC AB 3-0 SH 27 (SUTURE) ×2
SUT VIC AB 3-0 SH 27X BRD (SUTURE) ×1 IMPLANT
SYR 5ML LUER SLIP (SYRINGE) ×2 IMPLANT
SYR CONTROL 10ML LL (SYRINGE) ×2 IMPLANT
TOWEL GREEN STERILE FF (TOWEL DISPOSABLE) ×2 IMPLANT
TRAY DSU PREP LF (CUSTOM PROCEDURE TRAY) ×2 IMPLANT
TUBE CONNECTING 20X1/4 (TUBING) IMPLANT
YANKAUER SUCT BULB TIP NO VENT (SUCTIONS) IMPLANT

## 2019-11-23 NOTE — Interval H&P Note (Signed)
History and Physical Interval Note:  11/23/2019 11:33 AM  Felicia [name]  has presented today for surgery, with the diagnosis of BREAST CANCER.  The various methods of treatment have been discussed with the patient and family. After consideration of risks, benefits and other options for treatment, the patient has consented to  Procedure(s): INSERTION PORT-A-CATH WITH ULTRASOUND GUIDANCE (N/A) as a surgical intervention.  The patient's history has been reviewed, patient examined, no change in status, stable for surgery.  I have reviewed the patient's chart and labs.  Questions were answered to the patient's satisfaction.     Rolm Bookbinder

## 2019-11-23 NOTE — Op Note (Signed)
Preoperative diagnosis:left breast cancer Postoperative diagnosis: saa Procedure:Right IJ port placement Surgeon: Dr Serita Grammes EBL: minimal Anesthesia: general  Complications none Drains none Specimens:none Sponge and needle count correct times two dispo to recovery stable  Indications:51 yof with her 2 positive breast cancer. Here for port placement for systemic therapy.   Procedure:After informed consent was obtained the patient was taken to the operating room. She was given antibiotics. SCDs were placed. She was placed under general anesthesia without complication. She was prepped and draped in the standard sterile surgical fashion. A surgical timeout was then performed.  I identified the internal jugular vein on the right side with the ultrasound. I made a small nick in the skin. I accessed the internal jugular vein with the needle under ultrasound guidance. I passed the wireand confirmed it was in position by [place] and by fluoro. Imade anincision and developed a subcutaneous pocket for the port. I then tunneled between the port site as well as the insertion site. I brought the line through this. I then placed the dilator under fluoroscopic guidance over the wire. I removedthe wire andthe dilator. I then placed the line into the sheath. The sheath was then removed. I pulled the line back to be in the distal vena cava. I then hooked this up to the port. This was placed in the pocket and sutured in place with 2-0 Prolene suture. I then closed this with 3-0 Vicryl and 4-0 Monocryl. Glue was placed. I accessed this and left it accessed.  It withdrew blood and flushed easily. I packed it with heparin.

## 2019-11-23 NOTE — Discharge Instructions (Signed)

## 2019-11-23 NOTE — Transfer of Care (Signed)
Immediate Anesthesia Transfer of Care Note  Patient: Felicia [name]  Procedure(s) Performed: INSERTION PORT-A-CATH WITH ULTRASOUND GUIDANCE (Right Chest)  Patient Location: PACU  Anesthesia Type:General  Level of Consciousness: awake and alert   Airway & Oxygen Therapy: Patient Spontanous Breathing and Patient connected to face mask oxygen  Post-op Assessment: Report given to RN and Post -op Vital signs reviewed and stable  Post vital signs: Reviewed and stable  Last Vitals:  Vitals Value Taken Time  BP    Temp    Pulse    Resp    SpO2      Last Pain:  Vitals:   11/23/19 1038  TempSrc: Oral  PainSc: 0-No pain      Patients Stated Pain Goal: 3 (08/05/55 5051)  Complications: No complications documented.

## 2019-11-23 NOTE — Anesthesia Postprocedure Evaluation (Signed)
Anesthesia Post Note  Patient: Felicia [name]  Procedure(s) Performed: INSERTION PORT-A-CATH WITH ULTRASOUND GUIDANCE (Right Chest)     Patient location during evaluation: PACU Anesthesia Type: General Level of consciousness: awake and alert, oriented and patient cooperative Pain management: pain level controlled Vital Signs Assessment: post-procedure vital signs reviewed and stable Respiratory status: spontaneous breathing, nonlabored ventilation and respiratory function stable Cardiovascular status: blood pressure returned to baseline and stable Postop Assessment: no apparent nausea or vomiting Anesthetic complications: no   No complications documented.  Last Vitals:  Vitals:   11/23/19 1038 11/23/19 1247  BP: (!) 153/68 (!) 144/73  Pulse: 61 (!) 103  Resp: 20 17  Temp: 36.7 C (!) 36.4 C  SpO2: 98% 94%    Last Pain:  Vitals:   11/23/19 1247  TempSrc:   PainSc: 0-No pain                 Pervis Hocking

## 2019-11-23 NOTE — H&P (Signed)
Felicia [name] is an 69 y.o. female.   Chief Complaint: left breast cancer HPI: 25 yof s/p left lump/sn.  Doing well.  Her 2 positive, needs adjuvant chemotherapy and is here for port placement. Due to starttomorrow  Past Medical History:  Diagnosis Date  . Anemia   . Anxiety   . Breast cancer ([place]) 09/2019   left breast IMC  . CAD (coronary artery disease)    a. 3/2007s/p DES to the LAD [facility]); b. 01/2017 MV: EF 80%, small, mild apical ant defect w/o ischemia (felt to be breast atten). Low risk.  . Depression   . DM (diabetes mellitus) ([place])   . Essential hypertension   . GERD (gastroesophageal reflux disease)   . Headache    secondary to neck surgery per patient  . High triglycerides   . Hyperlipidemia   . Hypothyroid   . OA (osteoarthritis)    right knee,hands  . Overflow incontinence   . PONV (postoperative nausea and vomiting)    s/p gallbladder   . RLS (restless legs syndrome)   . Urinary urgency   . Uterine cancer ([place]) dx'd 2014    Past Surgical History:  Procedure Laterality Date  . BREAST LUMPECTOMY WITH RADIOACTIVE SEED AND SENTINEL LYMPH NODE BIOPSY Left 10/07/2019   Procedure: LEFT BREAST LUMPECTOMY WITH RADIOACTIVE SEED AND SENTINEL LYMPH NODE BIOPSY;  Surgeon: Rolm Bookbinder, MD;  Location: [facility];  Service: General;  Laterality: Left;  . CORONARY STENT PLACEMENT    . CORONARY/GRAFT ACUTE MI REVASCULARIZATION    . FINGER ARTHRODESIS Right 06/11/2019   Procedure: ARTHRODESIS INDEX FINGER DISTAL PHALANGEAL JOINT;  Surgeon: [name], MD;  Location: [facility];  Service: Orthopedics;  Laterality: Right;  . FOOT SURGERY Left   . LEFT HEART CATH AND CORONARY ANGIOGRAPHY N/A 02/16/2018   Procedure: LEFT HEART CATH AND CORONARY ANGIOGRAPHY;  Surgeon: Lorretta Harp, MD;  Location: [facility];  Service: Cardiovascular;  Laterality: N/A;  . REPLACEMENT TOTAL KNEE Right   . SPINE SURGERY    . TOTAL ABDOMINAL  HYSTERECTOMY     FOR UTERUS CANCER  . TOTAL HIP ARTHROPLASTY Left     Family History  Problem Relation Age of Onset  . AAA (abdominal aortic aneurysm) Mother   . Diabetes Father   . Alzheimer's disease Father   . Throat cancer Sister   . Breast cancer Cousin    Social History:  reports that she has never smoked. She has never used smokeless tobacco. She reports current alcohol use. She reports that she does not use drugs.  Allergies:  Allergies  Allergen Reactions  . Chloraprep One Step [Chlorhexidine Gluconate] Rash  . Estrogens Other (See Comments)    PATIENT HAS A HISTORY OF CANCER AND HAS BEEN TOLD TO NEVER TAKE ANYTHING CONTAINING ESTROGEN, AS IT MIGHT CAUSE A RECURRENCE  . Shrimp [Shellfish Allergy] Nausea And Vomiting    Medications Prior to Admission  Medication Sig Dispense Refill  . Ascorbic Acid (VITAMIN C PO) Take 1 tablet by mouth daily.    Marland Kitchen aspirin EC 81 MG tablet Take 81 mg by mouth daily.    Marland Kitchen atorvastatin (LIPITOR) 40 MG tablet TAKE 1 TABLET BY MOUTH EVERY DAY 90 tablet 1  . bisoprolol (ZEBETA) 5 MG tablet TAKE 1 TABLET BY MOUTH EVERY DAY 90 tablet 2  . Calcium Citrate-Vitamin D (CALCIUM + D PO) Take 1 tablet by mouth daily with supper.    Marland Kitchen CINNAMON PO Take 1  capsule by mouth daily with supper.    . diclofenac sodium (VOLTAREN) 1 % GEL Apply 2-4 g topically 4 (four) times daily as needed (as directed for pain).    Marland Kitchen escitalopram (LEXAPRO) 10 MG tablet Take 10 mg by mouth daily.    . Ferrous Sulfate (IRON PO) Take 1 tablet by mouth daily. alternates days:1 tablet one day and 2 tablets the next day    . ferrous sulfate 325 (65 FE) MG EC tablet Take 325 mg by mouth 3 (three) times daily with meals.    Marland Kitchen GLIPIZIDE XL 10 MG 24 hr tablet Take 20 mg by mouth daily.   3  . JARDIANCE 25 MG TABS tablet Take 25 mg by mouth every morning.   3  . levothyroxine (SYNTHROID, LEVOTHROID) 125 MCG tablet Take 125 mcg by mouth daily before breakfast.    . metFORMIN (GLUCOPHAGE)  500 MG tablet Take 2 tablets (1,000 mg total) by mouth See admin instructions. Take 1,000 mg by mouth 2 times a day- with breakfast and supper    . metroNIDAZOLE (METROGEL) 1 % gel Apply 1 application topically as needed (as directed to affected area).     . Multiple Vitamins-Calcium (ONE-A-DAY WOMENS PO) Take 1 tablet by mouth daily.    Marland Kitchen omeprazole (PRILOSEC) 40 MG capsule Take 40 mg by mouth daily with supper.     Marland Kitchen rOPINIRole (REQUIP) 0.5 MG tablet Take 0.5 mg by mouth at bedtime. 1-3 hours prior to bedtime    . tiZANidine (ZANAFLEX) 4 MG tablet Take 4 mg by mouth 3 (three) times daily. As needed    . TURMERIC PO Take 1 capsule by mouth daily with supper.    Marland Kitchen ACCU-CHEK GUIDE test strip 1 each by Other route as needed.    . diclofenac Sodium (VOLTAREN) 1 % GEL Apply 1 application topically 3 (three) times daily as needed.    . lidocaine-prilocaine (EMLA) cream Apply to affected area once 30 g 3  . nitroGLYCERIN (NITROSTAT) 0.4 MG SL tablet Place 1 tablet (0.4 mg total) under the tongue every 5 (five) minutes as needed for chest pain. 25 tablet 6  . ondansetron (ZOFRAN) 8 MG tablet Take 1 tablet (8 mg total) by mouth 2 (two) times daily as needed for refractory nausea / vomiting. Start on day 3 after chemo. 30 tablet 1  . prochlorperazine (COMPAZINE) 10 MG tablet Take 1 tablet by mouth before meals and at bedtime on days 2 and 3 after chemotherapy (counting chemotherapy day as day 1); after that may take as needed 30 tablet 1    Results for orders placed or performed during the hospital encounter of 11/23/19 (from the past 48 hour(s))  Glucose, capillary     Status: Abnormal   Collection Time: 11/23/19 11:05 AM  Result Value Ref Range   Glucose-Capillary 156 (H) 70 - 99 mg/dL    Comment: Glucose reference range applies only to samples taken after fasting for at least 8 hours.   No results found.  Review of Systems  All other systems reviewed and are negative.   Blood pressure (!)  153/68, pulse 61, temperature 98.1 F (36.7 C), temperature source Oral, resp. rate 20, height 5\' 4"  (1.626 m), weight 80.1 kg, SpO2 98 %. Physical Exam  cv rrr Lungs clear Left breast healing well  Assessment/Plan Left breast cancer Port placement, will leave accessed  Rolm Bookbinder, MD 11/23/2019, 11:32 AM

## 2019-11-23 NOTE — Addendum Note (Signed)
Addendum  created 11/23/19 1310 by Pervis Hocking, DO   Order list changed

## 2019-11-23 NOTE — Anesthesia Procedure Notes (Signed)
Procedure Name: LMA Insertion Performed by: Lavonia Dana, CRNA Pre-anesthesia Checklist: Patient identified, Emergency Drugs available, Suction available and Patient being monitored Patient Re-evaluated:Patient Re-evaluated prior to induction Oxygen Delivery Method: Circle system utilized Preoxygenation: Pre-oxygenation with 100% oxygen Induction Type: IV induction Ventilation: Mask ventilation without difficulty LMA: LMA inserted LMA Size: 4.0 Tube type: Oral Number of attempts: 1 Airway Equipment and Method: Bite block Placement Confirmation: positive ETCO2 and CO2 detector Tube secured with: Tape Dental Injury: Teeth and Oropharynx as per pre-operative assessment

## 2019-11-23 NOTE — Progress Notes (Signed)
[place]  [phone] Fax:([phone]     ID: Felicia [name] DOB: 18-Oct-1950  MR#: [id number]  NWG#:[id number]  Patient Care Team: Felicia Lass, MD as PCP - General (Family Medicine) Nahser, [name], MD as PCP - Cardiology (Cardiology) Felicia Kaufmann, RN as Oncology Nurse Navigator Felicia Germany, RN as Oncology Nurse Navigator Felicia Bookbinder, MD as Consulting Physician (General Surgery) Felicia [name], Felicia Dad, MD as Consulting Physician (Oncology) Felicia Gibson, MD as Attending Physician (Radiation Oncology) Felicia Pound, MD as Consulting Physician (Rheumatology) Felicia Cover, MD as Consulting Physician (Orthopedic Surgery) Felicia Pupa, DO as Consulting Physician (Obstetrics and Gynecology) Felicia Cruel, MD OTHER MD:  CHIEF COMPLAINT: Triple positive invasive breast cancer  CURRENT TREATMENT: Trastuzumab, gemcitabine, carboplatin  INTERVAL HISTORY: Felicia [name] returns today for follow up and treatment of her triple positive invasive breast cancer.  She is accompanied by her daughter  She underwent port insertion yesterday, 11/23/2019, in anticipation of beginning adjuvant chemotherapy today.  She is scheduled to receive carboplatin, gemcitabine, trastuzumab starting today, to be repeated every. 21 days x 6  Her most recent echocardiogram from 09/15/2019 showed an ejection fraction of 60-65%.   REVIEW OF SYSTEMS: Felicia [name] tolerated port insertion without complications. She tells me her blood sugars are still not well controlled but that she has an appointment with an endocrinologist whose name however she could not recall. Aside from that a detailed review of systems today was stable.   COVID 19 VACCINATION STATUS:    HISTORY OF CURRENT ILLNESS: From the original intake note:  Felicia [name] had routine screening mammography on 08/03/2019 showing a possible abnormality in the left breast. She underwent left diagnostic mammography with  tomography and left breast ultrasonography at [facility] on 08/17/2019 showing: breast density category B; 1.6 cm upper-outer left breast mass; no significant left axillary abnormalities.  Accordingly on 08/30/2019 she proceeded to biopsy of the left breast area in question. The pathology from this procedure ([id number]) showed: invasive mammary carcinoma, e-cadherin positive, grade 2. Prognostic indicators significant for: estrogen receptor, 100% positive and progesterone receptor, 40% positive, both with strong staining intensity. Proliferation marker Ki67 at 20%. HER2 equivocal by immunohistochemistry (2+), but negative by fluorescent in situ hybridization with a signals ratio   and number per cell  .  Of note, she also has a history of FIGO grade 3, stage IA endometrial carcinoma, diagnosed in 2014 in [place]. She underwent robotic assisted total hysterectomy with BSO and pelvic and perirectal aortic lymph node dissection as well as omental biopsy.  Tumor was found only in the endometrium arising in a polyp measuring 1.7 cm.  There was no myometrial invasion no lymphovascular space invasion, and no lower uterine segment involvement.  The cervix and adnexa, lymph nodes and omentum were all not involved.  She then received vaginal brachytherapy, no chemotherapy, completing treatment in the springtime of 2015. [Per patient's report, chemotherapy was suggested, but she opted against this.]  The patient's subsequent history is as detailed below.   PAST MEDICAL HISTORY: Past Medical History:  Diagnosis Date  . Anemia   . Anxiety   . Breast cancer ([place]) 09/2019   left breast IMC  . CAD (coronary artery disease)    a. 3/2007s/p DES to the LAD [facility]); b. 01/2017 MV: EF 80%, small, mild apical ant defect w/o ischemia (felt to be breast atten). Low risk.  . Depression   . DM (diabetes mellitus) ([place])   . Essential hypertension   . GERD (gastroesophageal  reflux disease)   . Headache     secondary to neck surgery per patient  . High triglycerides   . Hyperlipidemia   . Hypothyroid   . OA (osteoarthritis)    right knee,hands  . Overflow incontinence   . PONV (postoperative nausea and vomiting)    s/p gallbladder   . RLS (restless legs syndrome)   . Urinary urgency   . Uterine cancer ([place]) dx'd 2014    PAST SURGICAL HISTORY: Past Surgical History:  Procedure Laterality Date  . BREAST LUMPECTOMY WITH RADIOACTIVE SEED AND SENTINEL LYMPH NODE BIOPSY Left 10/07/2019   Procedure: LEFT BREAST LUMPECTOMY WITH RADIOACTIVE SEED AND SENTINEL LYMPH NODE BIOPSY;  Surgeon: Felicia Bookbinder, MD;  Location: [place];  Service: General;  Laterality: Left;  . CORONARY STENT PLACEMENT    . CORONARY/GRAFT ACUTE MI REVASCULARIZATION    . FINGER ARTHRODESIS Right 06/11/2019   Procedure: ARTHRODESIS INDEX FINGER DISTAL PHALANGEAL JOINT;  Surgeon: Felicia Cover, MD;  Location: [place];  Service: Orthopedics;  Laterality: Right;  . FOOT SURGERY Left   . LEFT HEART CATH AND CORONARY ANGIOGRAPHY N/A 02/16/2018   Procedure: LEFT HEART CATH AND CORONARY ANGIOGRAPHY;  Surgeon: Lorretta Harp, MD;  Location: [place] CV [facility];  Service: Cardiovascular;  Laterality: N/A;  . PORTACATH PLACEMENT Right 11/23/2019   Procedure: INSERTION PORT-A-CATH WITH ULTRASOUND GUIDANCE;  Surgeon: Felicia Bookbinder, MD;  Location: [place];  Service: General;  Laterality: Right;  . REPLACEMENT TOTAL KNEE Right   . SPINE SURGERY    . TOTAL ABDOMINAL HYSTERECTOMY     FOR UTERUS CANCER  . TOTAL HIP ARTHROPLASTY Left     FAMILY HISTORY: Family History  Problem Relation Age of Onset  . AAA (abdominal aortic aneurysm) Mother   . Diabetes Father   . Alzheimer's disease Father   . Throat cancer Sister   . Breast cancer Cousin    Her father died at age 48 from Alzheimer's. Her mother died at age 28 from a ruptured abdominal aneurysm. Felicia [name] has 2 brothers and 3  sisters. She reports breast cancer in a paternal cousin. She also notes throat cancer in her sister.   GYNECOLOGIC HISTORY:  No LMP recorded. Patient has had a hysterectomy. Menarche: 69 years old Age at first live birth: 69 years old [place] P 2 LMP 1996 Contraceptive: prior use without issue HRT never used  Hysterectomy? Yes, 2014 BSO? 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  She unpacks 40 pounds boxes of chicken, has to cook them in 13 [name] cooking pans, and is generally on her feet at work but does not otherwise exercise.  She is divorced. She lives at home by herself, with no pets. Daughter Pershing Proud, age 46, is a radiology tech in [place]. Daughter Martyn Malay, age 35, is an Optometrist in [place], [place]. Craig has one grandchild. She is not a Ambulance person.    ADVANCED DIRECTIVES: Not in place; intends to name daughter [name] as her HCPOA.   HEALTH MAINTENANCE: Social History   Tobacco Use  . Smoking status: Never Smoker  . Smokeless tobacco: Never Used  Vaping Use  . Vaping Use: Never used  Substance Use Topics  . Alcohol use: Yes    Comment: OCCASIONALLY  . Drug use: No     Colonoscopy: <10 years ago, [place]  PAP: 2014?  Bone density: date unsure, also in [place]   Allergies  Allergen Reactions  . Chloraprep One Step [Chlorhexidine Gluconate] Rash  . Estrogens Other (See Comments)    PATIENT HAS A HISTORY OF CANCER AND HAS BEEN TOLD TO NEVER TAKE ANYTHING CONTAINING ESTROGEN, AS IT MIGHT CAUSE A RECURRENCE  . Shrimp [Shellfish Allergy] Nausea And Vomiting    Current Outpatient Medications  Medication Sig Dispense Refill  . ACCU-CHEK GUIDE test strip 1 each by Other route as needed.    . Ascorbic Acid (VITAMIN C PO) Take 1 tablet by mouth daily.    Marland Kitchen aspirin EC 81 MG tablet Take 81 mg by mouth daily.    Marland Kitchen atorvastatin (LIPITOR) 40 MG tablet TAKE 1 TABLET BY MOUTH EVERY DAY 90 tablet 1  . bisoprolol  (ZEBETA) 5 MG tablet TAKE 1 TABLET BY MOUTH EVERY DAY 90 tablet 2  . Calcium Citrate-Vitamin D (CALCIUM + D PO) Take 1 tablet by mouth daily with supper.    Marland Kitchen CINNAMON PO Take 1 capsule by mouth daily with supper.    . diclofenac sodium (VOLTAREN) 1 % GEL Apply 2-4 g topically 4 (four) times daily as needed (as directed for pain).    Marland Kitchen diclofenac Sodium (VOLTAREN) 1 % GEL Apply 1 application topically 3 (three) times daily as needed.    Marland Kitchen escitalopram (LEXAPRO) 10 MG tablet Take 10 mg by mouth daily.    . Ferrous Sulfate (IRON PO) Take 1 tablet by mouth daily. alternates days:1 tablet one day and 2 tablets the next day    . ferrous sulfate 325 (65 FE) MG EC tablet Take 325 mg by mouth 3 (three) times daily with meals.    Marland Kitchen GLIPIZIDE XL 10 MG 24 hr tablet Take 20 mg by mouth daily.   3  . JARDIANCE 25 MG TABS tablet Take 25 mg by mouth every morning.   3  . levothyroxine (SYNTHROID, LEVOTHROID) 125 MCG tablet Take 125 mcg by mouth daily before breakfast.    . lidocaine-prilocaine (EMLA) cream Apply to affected area once 30 g 3  . metFORMIN (GLUCOPHAGE) 500 MG tablet Take 2 tablets (1,000 mg total) by mouth See admin instructions. Take 1,000 mg by mouth 2 times a day- with breakfast and supper    . metroNIDAZOLE (METROGEL) 1 % gel Apply 1 application topically as needed (as directed to affected area).     . Multiple Vitamins-Calcium (ONE-A-DAY WOMENS PO) Take 1 tablet by mouth daily.    . nitroGLYCERIN (NITROSTAT) 0.4 MG SL tablet Place 1 tablet (0.4 mg total) under the tongue every 5 (five) minutes as needed for chest pain. 25 tablet 6  . omeprazole (PRILOSEC) 40 MG capsule Take 40 mg by mouth daily with supper.     . ondansetron (ZOFRAN) 8 MG tablet Take 1 tablet (8 mg total) by mouth 2 (two) times daily as needed for refractory nausea / vomiting. Start on day 3 after chemo. 30 tablet 1  . prochlorperazine (COMPAZINE) 10 MG tablet Take 1 tablet by mouth before meals and at bedtime on days 2 and 3  after chemotherapy (counting chemotherapy day as day 1); after that may take as needed 30 tablet 1  . rOPINIRole (REQUIP) 0.5 MG tablet Take 0.5 mg by mouth at bedtime. 1-3 hours prior to bedtime    . tiZANidine (ZANAFLEX) 4 MG tablet Take 4 mg by mouth 3 (three) times daily. As needed    . TURMERIC PO Take 1 capsule by mouth daily with supper.     No current facility-administered medications for this  visit.   Facility-Administered Medications Ordered in Other Visits  Medication Dose Route Frequency Provider Last Rate Last Admin  . sodium chloride flush (NS) 0.9 % injection 10 mL  10 mL Intracatheter PRN Tawn Fitzner, Felicia Dad, MD   6 mL at 11/24/19 1426    OBJECTIVE: White woman who appears stated age  Vitals:   11/24/19 0832  BP: (!) 106/40  Pulse: 62  Resp: 18  Temp: 97.6 F (36.4 C)  SpO2: 98%     Body mass index is 31.07 kg/m.   Wt Readings from Last 3 Encounters:  11/24/19 181 lb (82.1 kg)  11/23/19 176 lb 9.4 oz (80.1 kg)  11/04/19 173 lb 8 oz (78.7 kg)      ECOG FS:1 - Symptomatic but completely ambulatory  Sclerae unicteric, EOMs intact Wearing a mask No cervical or supraclavicular adenopathy Lungs no rales or rhonchi Heart regular rate and rhythm Abd soft, nontender, positive bowel sounds MSK no focal spinal tenderness, no upper extremity lymphedema Neuro: nonfocal, well oriented, appropriate affect Breasts: Deferred Skin: Port in place, accessed, no evidence of inflammation or dehiscence  LAB RESULTS:  CMP     Component Value Date/Time   NA 138 11/24/2019 0821   NA 139 11/25/2018 1053   K 4.1 11/24/2019 0821   CL 103 11/24/2019 0821   CO2 22 11/24/2019 0821   GLUCOSE 232 (H) 11/24/2019 0821   BUN 18 11/24/2019 0821   BUN 18 11/25/2018 1053   CREATININE 0.83 11/24/2019 0821   CALCIUM 10.1 11/24/2019 0821   PROT 6.5 11/24/2019 0821   PROT 7.1 11/25/2018 1053   ALBUMIN 3.5 11/24/2019 0821   ALBUMIN 4.2 11/25/2018 1053   AST 11 (L) 11/24/2019 0821    ALT 15 11/24/2019 0821   ALKPHOS 42 11/24/2019 0821   BILITOT 0.4 11/24/2019 0821   GFRNONAA >60 11/24/2019 0821   GFRAA >60 10/04/2019 1330   GFRAA >60 09/08/2019 0811    No results found for: TOTALPROTELP, ALBUMINELP, A1GS, A2GS, BETS, BETA2SER, GAMS, MSPIKE, SPEI  Lab Results  Component Value Date   WBC 12.2 (H) 11/24/2019   NEUTROABS 4.7 11/24/2019   HGB 12.7 11/24/2019   HCT 39.6 11/24/2019   MCV 93.6 11/24/2019   PLT 219 11/24/2019    No results found for: LABCA2  No components found for: CHYIFO277  No results for input(s): INR in the last 168 hours.  No results found for: LABCA2  No results found for: AJO878  No results found for: MVE720  No results found for: NOB096  No results found for: CA2729  No components found for: HGQUANT  No results found for: CEA1 / No results found for: CEA1   No results found for: AFPTUMOR  No results found for: CHROMOGRNA  No results found for: KPAFRELGTCHN, LAMBDASER, KAPLAMBRATIO (kappa/lambda light chains)  No results found for: HGBA, HGBA2QUANT, HGBFQUANT, HGBSQUAN (Hemoglobinopathy evaluation)   No results found for: LDH  No results found for: IRON, TIBC, IRONPCTSAT (Iron and TIBC)  No results found for: FERRITIN  Urinalysis No results found for: COLORURINE, APPEARANCEUR, LABSPEC, PHURINE, GLUCOSEU, HGBUR, BILIRUBINUR, KETONESUR, PROTEINUR, UROBILINOGEN, NITRITE, LEUKOCYTESUR   STUDIES: DG Chest 1 View  Result Date: 11/23/2019 CLINICAL DATA:  Chest wall port placement EXAM: CHEST  1 VIEW; DG C-ARM 1-60 MIN COMPARISON:  None. FLUOROSCOPY TIME:  Fluoroscopy Time:  16 seconds Radiation Exposure Index (if provided by the fluoroscopic device): 2.09 mGy Number of Acquired Spot Images: 1 FINDINGS: Single spot film reveals the right-sided chest wall port  with catheter tip at the cavoatrial junction. No definitive pneumothorax is seen although the upper lung is incompletely evaluated on this exam. IMPRESSION: Status post  right port placement without definitive pneumothorax. Electronically Signed   By: Inez Catalina M.D.   On: 11/23/2019 13:14   DG C-Arm 1-60 Min  Result Date: 11/23/2019 CLINICAL DATA:  Chest wall port placement EXAM: CHEST  1 VIEW; DG C-ARM 1-60 MIN COMPARISON:  None. FLUOROSCOPY TIME:  Fluoroscopy Time:  16 seconds Radiation Exposure Index (if provided by the fluoroscopic device): 2.09 mGy Number of Acquired Spot Images: 1 FINDINGS: Single spot film reveals the right-sided chest wall port with catheter tip at the cavoatrial junction. No definitive pneumothorax is seen although the upper lung is incompletely evaluated on this exam. IMPRESSION: Status post right port placement without definitive pneumothorax. Electronically Signed   By: Inez Catalina M.D.   On: 11/23/2019 13:14     ELIGIBLE FOR AVAILABLE RESEARCH PROTOCOL: AET  ASSESSMENT: 69 y.o. [place] woman status post left breast upper outer quadrant biopsy 08/30/2019 for a clinical T1c N0, stage IA invasive ductal carcinoma, grade 2, estrogen and progesterone receptor positive, HER-2 amplified, with an MIB-1 of 20%.  (0) status post TAH/BSO 2014 for a FIGO grade 3, stage IA endometrial carcinoma, status post vaginal brachytherapy, no adjuvant chemotherapy  (1) status post left lumpectomy and axillary sentinel node sampling 10/07/2019 for a pT1c pN1, stage IB invasive ductal carcinoma, grade 2, with negative margins.  (a) a total of 1 sentinel lymph node was removed.  (2) adjuvant chemotherapy to consist of carboplatin, gemcitabine, trastuzumab  (a) baseline echo 09/15/2019 shows an ejection fraction in the 60-65% range.  (b) will not use docetaxel secondary to severe peripheral neuropathy present at baseline  (3) trastuzumab to be continued to complete a year  (4) adjuvant radiation to follow  (5) antiestrogens to start at the completion of local treatment   PLAN: Marlise is ready to start chemotherapy today. We have designed her  regimen so that she would not need steroids and also to avoid worsening her already present neuropathy. The plan is for 6 cycles of chemotherapy to be followed by Herceptin to complete a year. She will need repeat echocardiography every 3 months while on Herceptin.  We reviewed again the possible toxicities side effects and complications of these agents and also how to take her supportive medicines. She will return in 2 days to receive her immune booster shot and then in a week so we can troubleshoot side effects. I have asked her to keep a symptom diary  Total encounter time today 35 minutes.Sarajane Jews C. Abdiel Blackerby, MD 11/24/2019 3:19 PM Medical Oncology and Hematology [facility] [place], [place] [address] Tel. [phone]    Fax. [phone]   This document serves as a record of services personally performed by Lurline Del, MD. It was created on his behalf by Wilburn Mylar, a trained medical scribe. The creation of this record is based on the scribe's personal observations and the provider's statements to them.   I, Lurline Del MD, have reviewed the above documentation for accuracy and completeness, and I agree with the above.   *Total Encounter Time as defined by the Centers for Medicare and Medicaid Services includes, in addition to the face-to-face time of a patient visit (documented in the note above) non-face-to-face time: obtaining and reviewing outside history, ordering and reviewing medications, tests or procedures, care coordination (communications with other health care professionals  or caregivers) and documentation in the medical record.

## 2019-11-23 NOTE — Anesthesia Preprocedure Evaluation (Addendum)
Anesthesia Evaluation  Patient identified by MRN, date of birth, ID band Patient awake    Reviewed: Allergy & Precautions, NPO status , Patient's Chart, lab work & pertinent test results, reviewed documented beta blocker date and time   History of Anesthesia Complications Negative for: history of anesthetic complications  Airway Mallampati: II  TM Distance: >3 FB Neck ROM: Limited   Comment: Limited neck extension since ACDF Dental  (+) Teeth Intact, Dental Advisory Given   Pulmonary neg pulmonary ROS,    Pulmonary exam normal breath sounds clear to auscultation       Cardiovascular hypertension, Pt. on home beta blockers + CAD and + Cardiac Stents (2007 DES to LAD)  Normal cardiovascular exam Rhythm:Regular Rate:Normal  Echo 9/21:  Study Result    ECHOCARDIOGRAM REPORT       Patient Name:  Felicia [name] Date of Exam: 09/15/2019  Medical Rec #: [id number]   Height:    64.0 in  Accession #:  [id number]  Weight:    172.4 lb  Date of Birth: May 11, 1950  BSA:     1.837 m  Patient Age:  69 years   BP:      130/67 mmHg  Patient Gender: F       HR:      73 bpm.  Exam Location: Outpatient   Procedure: 2D Echo, 3D Echo and Strain Analysis   Indications:  Chemotherapy evaluation v87.41 / v58.11    History:    Patient has prior history of Echocardiogram examinations,  most         recent 02/15/2018. CAD; Risk Factors:Dyslipidemia,  Hypertension         and Diabetes. Breast Cancer. GERD.    Sonographer:  Darlina Sicilian RDCS  Referring Phys: [place]    1. Left ventricular ejection fraction, by estimation, is 60 to 65%. The  left ventricle has normal function. The left ventricle has no regional  wall motion abnormalities. There is mild left ventricular hypertrophy.  Left ventricular diastolic parameters  are indeterminate.   2. Right ventricular systolic function is normal. The right ventricular  size is normal. Tricuspid regurgitation signal is inadequate for assessing  PA pressure.  3. The mitral valve is normal in structure. Trivial mitral valve  regurgitation. No evidence of mitral stenosis.  4. The aortic valve is grossly normal. Aortic valve regurgitation is  trivial. No aortic stenosis is present.  5. The inferior vena cava is normal in size with greater than 50%  respiratory variability, suggesting right atrial pressure of 3 mmHg.     Neuro/Psych  Headaches, PSYCHIATRIC DISORDERS Anxiety Depression    GI/Hepatic Neg liver ROS, GERD  Medicated and Controlled,  Endo/Other  diabetes, Poorly Controlled, Type 2, Insulin DependentHypothyroidism   Renal/GU negative Renal ROS   Hx uterine ca    Musculoskeletal  (+) Arthritis , Osteoarthritis,    Abdominal (+) + obese,   Peds  Hematology  (+) Blood dyscrasia, anemia ,   Anesthesia Other Findings Breast ca  Reproductive/Obstetrics negative OB ROS                            Anesthesia Physical Anesthesia Plan  ASA: III  Anesthesia Plan: General   Post-op Pain Management:    Induction: Intravenous  PONV Risk Score and Plan: 3 and Ondansetron, Dexamethasone, Midazolam, Scopolamine patch - Pre-op and Treatment may vary due to age or medical condition  Airway Management  Planned: LMA  Additional Equipment: None  Intra-op Plan:   Post-operative Plan: Extubation in OR  Informed Consent: I have reviewed the patients History and Physical, chart, labs and discussed the procedure including the risks, benefits and alternatives for the proposed anesthesia with the patient or authorized representative who has indicated his/her understanding and acceptance.     Dental advisory given  Plan Discussed with: CRNA  Anesthesia Plan Comments:        Anesthesia Quick Evaluation

## 2019-11-24 ENCOUNTER — Ambulatory Visit: Payer: Medicare Other

## 2019-11-24 ENCOUNTER — Encounter (HOSPITAL_BASED_OUTPATIENT_CLINIC_OR_DEPARTMENT_OTHER): Payer: Self-pay | Admitting: General Surgery

## 2019-11-24 ENCOUNTER — Inpatient Hospital Stay: Payer: Medicare Other

## 2019-11-24 ENCOUNTER — Other Ambulatory Visit: Payer: Self-pay | Admitting: *Deleted

## 2019-11-24 ENCOUNTER — Telehealth: Payer: Self-pay | Admitting: Oncology

## 2019-11-24 ENCOUNTER — Other Ambulatory Visit: Payer: Self-pay

## 2019-11-24 ENCOUNTER — Inpatient Hospital Stay (HOSPITAL_BASED_OUTPATIENT_CLINIC_OR_DEPARTMENT_OTHER): Payer: Medicare Other | Admitting: Oncology

## 2019-11-24 ENCOUNTER — Encounter: Payer: Self-pay | Admitting: *Deleted

## 2019-11-24 ENCOUNTER — Encounter: Payer: Self-pay | Admitting: Licensed Clinical Social Worker

## 2019-11-24 VITALS — BP 106/40 | HR 62 | Temp 97.6°F | Resp 18 | Ht 64.0 in | Wt 181.0 lb

## 2019-11-24 DIAGNOSIS — I2510 Atherosclerotic heart disease of native coronary artery without angina pectoris: Secondary | ICD-10-CM | POA: Diagnosis not present

## 2019-11-24 DIAGNOSIS — C541 Malignant neoplasm of endometrium: Secondary | ICD-10-CM

## 2019-11-24 DIAGNOSIS — Z803 Family history of malignant neoplasm of breast: Secondary | ICD-10-CM | POA: Diagnosis not present

## 2019-11-24 DIAGNOSIS — C50412 Malignant neoplasm of upper-outer quadrant of left female breast: Secondary | ICD-10-CM | POA: Diagnosis not present

## 2019-11-24 DIAGNOSIS — E119 Type 2 diabetes mellitus without complications: Secondary | ICD-10-CM | POA: Diagnosis not present

## 2019-11-24 DIAGNOSIS — Z95828 Presence of other vascular implants and grafts: Secondary | ICD-10-CM | POA: Insufficient documentation

## 2019-11-24 DIAGNOSIS — Z818 Family history of other mental and behavioral disorders: Secondary | ICD-10-CM | POA: Diagnosis not present

## 2019-11-24 DIAGNOSIS — Z969 Presence of functional implant, unspecified: Secondary | ICD-10-CM | POA: Insufficient documentation

## 2019-11-24 DIAGNOSIS — Z170 Estrogen receptor positive status [ER+]: Secondary | ICD-10-CM

## 2019-11-24 DIAGNOSIS — Z90722 Acquired absence of ovaries, bilateral: Secondary | ICD-10-CM | POA: Diagnosis not present

## 2019-11-24 DIAGNOSIS — Z5111 Encounter for antineoplastic chemotherapy: Secondary | ICD-10-CM | POA: Diagnosis present

## 2019-11-24 DIAGNOSIS — Z833 Family history of diabetes mellitus: Secondary | ICD-10-CM | POA: Diagnosis not present

## 2019-11-24 DIAGNOSIS — Z801 Family history of malignant neoplasm of trachea, bronchus and lung: Secondary | ICD-10-CM | POA: Diagnosis not present

## 2019-11-24 DIAGNOSIS — Z8249 Family history of ischemic heart disease and other diseases of the circulatory system: Secondary | ICD-10-CM | POA: Diagnosis not present

## 2019-11-24 DIAGNOSIS — Z5112 Encounter for antineoplastic immunotherapy: Secondary | ICD-10-CM | POA: Diagnosis not present

## 2019-11-24 DIAGNOSIS — E785 Hyperlipidemia, unspecified: Secondary | ICD-10-CM | POA: Diagnosis not present

## 2019-11-24 DIAGNOSIS — Z79899 Other long term (current) drug therapy: Secondary | ICD-10-CM | POA: Diagnosis not present

## 2019-11-24 LAB — CBC WITH DIFFERENTIAL (CANCER CENTER ONLY)
Abs Immature Granulocytes: 0.04 10*3/uL (ref 0.00–0.07)
Basophils Absolute: 0 10*3/uL (ref 0.0–0.1)
Basophils Relative: 0 %
Eosinophils Absolute: 0 10*3/uL (ref 0.0–0.5)
Eosinophils Relative: 0 %
HCT: 39.6 % (ref 36.0–46.0)
Hemoglobin: 12.7 g/dL (ref 12.0–15.0)
Immature Granulocytes: 0 %
Lymphocytes Relative: 54 %
Lymphs Abs: 6.6 10*3/uL — ABNORMAL HIGH (ref 0.7–4.0)
MCH: 30 pg (ref 26.0–34.0)
MCHC: 32.1 g/dL (ref 30.0–36.0)
MCV: 93.6 fL (ref 80.0–100.0)
Monocytes Absolute: 0.9 10*3/uL (ref 0.1–1.0)
Monocytes Relative: 7 %
Neutro Abs: 4.7 10*3/uL (ref 1.7–7.7)
Neutrophils Relative %: 39 %
Platelet Count: 219 10*3/uL (ref 150–400)
RBC: 4.23 MIL/uL (ref 3.87–5.11)
RDW: 15.6 % — ABNORMAL HIGH (ref 11.5–15.5)
WBC Count: 12.2 10*3/uL — ABNORMAL HIGH (ref 4.0–10.5)
nRBC: 0 % (ref 0.0–0.2)

## 2019-11-24 LAB — CMP (CANCER CENTER ONLY)
ALT: 15 U/L (ref 0–44)
AST: 11 U/L — ABNORMAL LOW (ref 15–41)
Albumin: 3.5 g/dL (ref 3.5–5.0)
Alkaline Phosphatase: 42 U/L (ref 38–126)
Anion gap: 13 (ref 5–15)
BUN: 18 mg/dL (ref 8–23)
CO2: 22 mmol/L (ref 22–32)
Calcium: 10.1 mg/dL (ref 8.9–10.3)
Chloride: 103 mmol/L (ref 98–111)
Creatinine: 0.83 mg/dL (ref 0.44–1.00)
GFR, Estimated: >60 mL/min (ref >60)
Glucose, Bld: 232 mg/dL — ABNORMAL HIGH (ref 70–99)
Potassium: 4.1 mmol/L (ref 3.5–5.1)
Sodium: 138 mmol/L (ref 135–145)
Total Bilirubin: 0.4 mg/dL (ref 0.3–1.2)
Total Protein: 6.5 g/dL (ref 6.5–8.1)

## 2019-11-24 MED ORDER — ZOLEDRONIC ACID 4 MG/100ML IV SOLN
INTRAVENOUS | Status: AC
  Filled 2019-11-24: qty 100

## 2019-11-24 MED ORDER — TRASTUZUMAB-DKST CHEMO 150 MG IV SOLR
8.0000 mg/kg | Freq: Once | INTRAVENOUS | Status: AC
  Administered 2019-11-24: 630 mg via INTRAVENOUS
  Filled 2019-11-24: qty 30

## 2019-11-24 MED ORDER — ACETAMINOPHEN 325 MG PO TABS
ORAL_TABLET | ORAL | Status: AC
  Filled 2019-11-24: qty 2

## 2019-11-24 MED ORDER — PALONOSETRON HCL INJECTION 0.25 MG/5ML
0.2500 mg | Freq: Once | INTRAVENOUS | Status: AC
  Administered 2019-11-24: 0.25 mg via INTRAVENOUS

## 2019-11-24 MED ORDER — HEPARIN SOD (PORK) LOCK FLUSH 100 UNIT/ML IV SOLN
500.0000 [IU] | Freq: Once | INTRAVENOUS | Status: AC | PRN
  Administered 2019-11-24: 500 [IU]
  Filled 2019-11-24: qty 5

## 2019-11-24 MED ORDER — SODIUM CHLORIDE 0.9 % IV SOLN
Freq: Once | INTRAVENOUS | Status: AC
  Filled 2019-11-24: qty 250

## 2019-11-24 MED ORDER — ACETAMINOPHEN 325 MG PO TABS
650.0000 mg | ORAL_TABLET | Freq: Once | ORAL | Status: AC
  Administered 2019-11-24: 650 mg via ORAL

## 2019-11-24 MED ORDER — SODIUM CHLORIDE 0.9 % IV SOLN
800.0000 mg/m2 | Freq: Once | INTRAVENOUS | Status: AC
  Administered 2019-11-24: 1520 mg via INTRAVENOUS
  Filled 2019-11-24: qty 39.98

## 2019-11-24 MED ORDER — SODIUM CHLORIDE 0.9 % IV SOLN
150.0000 mg | Freq: Once | INTRAVENOUS | Status: AC
  Administered 2019-11-24: 150 mg via INTRAVENOUS
  Filled 2019-11-24: qty 150

## 2019-11-24 MED ORDER — DIPHENHYDRAMINE HCL 25 MG PO CAPS
ORAL_CAPSULE | ORAL | Status: AC
  Filled 2019-11-24: qty 1

## 2019-11-24 MED ORDER — DIPHENHYDRAMINE HCL 25 MG PO CAPS
25.0000 mg | ORAL_CAPSULE | Freq: Once | ORAL | Status: AC
  Administered 2019-11-24: 25 mg via ORAL

## 2019-11-24 MED ORDER — SODIUM CHLORIDE 0.9% FLUSH
10.0000 mL | Freq: Once | INTRAVENOUS | Status: AC
  Administered 2019-11-24: 10 mL
  Filled 2019-11-24: qty 10

## 2019-11-24 MED ORDER — PROCHLORPERAZINE EDISYLATE 10 MG/2ML IJ SOLN
INTRAMUSCULAR | Status: AC
  Filled 2019-11-24: qty 2

## 2019-11-24 MED ORDER — PALONOSETRON HCL INJECTION 0.25 MG/5ML
INTRAVENOUS | Status: AC
  Filled 2019-11-24: qty 5

## 2019-11-24 MED ORDER — SODIUM CHLORIDE 0.9 % IV SOLN
455.0000 mg | Freq: Once | INTRAVENOUS | Status: AC
  Administered 2019-11-24: 460 mg via INTRAVENOUS
  Filled 2019-11-24: qty 46

## 2019-11-24 MED ORDER — SODIUM CHLORIDE 0.9% FLUSH
10.0000 mL | INTRAVENOUS | Status: DC | PRN
  Administered 2019-11-24: 6 mL
  Filled 2019-11-24: qty 10

## 2019-11-24 MED ORDER — PROCHLORPERAZINE EDISYLATE 10 MG/2ML IJ SOLN
10.0000 mg | Freq: Once | INTRAMUSCULAR | Status: AC
  Administered 2019-11-24: 10 mg via INTRAVENOUS

## 2019-11-24 NOTE — Patient Instructions (Addendum)
[facility] Discharge Instructions for Patients Receiving Chemotherapy  Today you received the following chemotherapy agents: trastuzumab, gemzar, carboplatin  To help prevent nausea and vomiting after your treatment, we encourage you to take your nausea medication as directed.    If you develop nausea and vomiting that is not controlled by your nausea medication, call the clinic.   BELOW ARE SYMPTOMS THAT SHOULD BE REPORTED IMMEDIATELY:  *FEVER GREATER THAN 100.5 F  *CHILLS WITH OR WITHOUT FEVER  NAUSEA AND VOMITING THAT IS NOT CONTROLLED WITH YOUR NAUSEA MEDICATION  *UNUSUAL SHORTNESS OF BREATH  *UNUSUAL BRUISING OR BLEEDING  TENDERNESS IN MOUTH AND THROAT WITH OR WITHOUT PRESENCE OF ULCERS  *URINARY PROBLEMS  *BOWEL PROBLEMS  UNUSUAL RASH Items with * indicate a potential emergency and should be followed up as soon as possible.  Feel free to call the clinic should you have any questions or concerns. The clinic phone number is [phone].  Please show the [facility] at check-in to the Emergency Department and triage nurse.  Trastuzumab; Hyaluronidase injection What is this medicine? TRASTUZUMAB; HYALURONIDASE (tras TOO zoo mab / hye al ur ON i dase) is used to treat breast cancer and stomach cancer. Trastuzumab is a monoclonal antibody. Hyaluronidase is used to improve the effects of trastuzumab. This medicine may be used for other purposes; ask your health care provider or pharmacist if you have questions. COMMON BRAND NAME(S): HERCEPTIN HYLECTA What should I tell my health care provider before I take this medicine? They need to know if you have any of these conditions:  heart disease  heart failure  lung or breathing disease, like asthma  an unusual or allergic reaction to trastuzumab, or other medications, foods, dyes, or preservatives  pregnant or trying to get pregnant  breast-feeding How should I use this medicine? This medicine is  for injection under the skin. It is given by a health care professional in a hospital or clinic setting. Talk to your pediatrician regarding the use of this medicine in children. This medicine is not approved for use in children. Overdosage: If you think you have taken too much of this medicine contact a poison control center or emergency room at once. NOTE: This medicine is only for you. Do not share this medicine with others. What if I miss a dose? It is important not to miss a dose. Call your doctor or health care professional if you are unable to keep an appointment. What may interact with this medicine? This medicine may interact with the following medications:  certain types of chemotherapy, such as daunorubicin, doxorubicin, epirubicin, and idarubicin This list may not describe all possible interactions. Give your health care provider a list of all the medicines, herbs, non-prescription drugs, or dietary supplements you use. Also tell them if you smoke, drink alcohol, or use illegal drugs. Some items may interact with your medicine. What should I watch for while using this medicine? Visit your doctor for checks on your progress. Report any side effects. Continue your course of treatment even though you feel ill unless your doctor tells you to stop. Call your doctor or health care professional for advice if you get a fever, chills or sore throat, or other symptoms of a cold or flu. Do not treat yourself. Try to avoid being around people who are sick. You may experience fever, chills and shaking during your first infusion. These effects are usually mild and can be treated with other medicines. Report any side effects during  the infusion to your health care professional. Fever and chills usually do not happen with later infusions. Do not become pregnant while taking this medicine or for 7 months after stopping it. Women should inform their doctor if they wish to become pregnant or think they might  be pregnant. Women of child-bearing potential will need to have a negative pregnancy test before starting this medicine. There is a potential for serious side effects to an unborn child. Talk to your health care professional or pharmacist for more information. Do not breast-feed an infant while taking this medicine or for 7 months after stopping it. What side effects may I notice from receiving this medicine? Side effects that you should report to your doctor or health care professional as soon as possible:  allergic reactions like skin rash, itching or hives, swelling of the face, lips, or tongue  breathing problems  chest pain or palpitations  cough  fever  general ill feeling or flu-like symptoms  signs of worsening heart failure like breathing problems; swelling in your legs and feet Side effects that usually do not require medical attention (report these to your doctor or health care professional if they continue or are bothersome):  bone pain  changes in taste  diarrhea  joint pain  nausea/vomiting  unusually weak or tired  weight loss This list may not describe all possible side effects. Call your doctor for medical advice about side effects. You may report side effects to FDA at 1-800-FDA-1088. Where should I keep my medicine? This drug is given in a hospital or clinic and will not be stored at home. NOTE: This sheet is a summary. It may not cover all possible information. If you have questions about this medicine, talk to your doctor, pharmacist, or health care provider.  2020 Elsevier/Gold Standard (2017-03-21 21:54:17)  Gemcitabine injection What is this medicine? GEMCITABINE (jem SYE ta been) is a chemotherapy drug. This medicine is used to treat many types of cancer like breast cancer, lung cancer, pancreatic cancer, and ovarian cancer. This medicine may be used for other purposes; ask your health care provider or pharmacist if you have questions. COMMON BRAND  NAME(S): Gemzar, Infugem What should I tell my health care provider before I take this medicine? They need to know if you have any of these conditions:  blood disorders  infection  kidney disease  liver disease  lung or breathing disease, like asthma  recent or ongoing radiation therapy  an unusual or allergic reaction to gemcitabine, other chemotherapy, other medicines, foods, dyes, or preservatives  pregnant or trying to get pregnant  breast-feeding How should I use this medicine? This drug is given as an infusion into a vein. It is administered in a hospital or clinic by a specially trained health care professional. Talk to your pediatrician regarding the use of this medicine in children. Special care may be needed. Overdosage: If you think you have taken too much of this medicine contact a poison control center or emergency room at once. NOTE: This medicine is only for you. Do not share this medicine with others. What if I miss a dose? It is important not to miss your dose. Call your doctor or health care professional if you are unable to keep an appointment. What may interact with this medicine?  medicines to increase blood counts like filgrastim, pegfilgrastim, sargramostim  some other chemotherapy drugs like cisplatin  vaccines Talk to your doctor or health care professional before taking any of these medicines:  acetaminophen  aspirin  ibuprofen  ketoprofen  naproxen This list may not describe all possible interactions. Give your health care provider a list of all the medicines, herbs, non-prescription drugs, or dietary supplements you use. Also tell them if you smoke, drink alcohol, or use illegal drugs. Some items may interact with your medicine. What should I watch for while using this medicine? Visit your doctor for checks on your progress. This drug may make you feel generally unwell. This is not uncommon, as chemotherapy can affect healthy cells as well  as cancer cells. Report any side effects. Continue your course of treatment even though you feel ill unless your doctor tells you to stop. In some cases, you may be given additional medicines to help with side effects. Follow all directions for their use. Call your doctor or health care professional for advice if you get a fever, chills or sore throat, or other symptoms of a cold or flu. Do not treat yourself. This drug decreases your body's ability to fight infections. Try to avoid being around people who are sick. This medicine may increase your risk to bruise or bleed. Call your doctor or health care professional if you notice any unusual bleeding. Be careful brushing and flossing your teeth or using a toothpick because you may get an infection or bleed more easily. If you have any dental work done, tell your dentist you are receiving this medicine. Avoid taking products that contain aspirin, acetaminophen, ibuprofen, naproxen, or ketoprofen unless instructed by your doctor. These medicines may hide a fever. Do not become pregnant while taking this medicine or for 6 months after stopping it. Women should inform their doctor if they wish to become pregnant or think they might be pregnant. Men should not father a child while taking this medicine and for 3 months after stopping it. There is a potential for serious side effects to an unborn child. Talk to your health care professional or pharmacist for more information. Do not breast-feed an infant while taking this medicine or for at least 1 week after stopping it. Men should inform their doctors if they wish to father a child. This medicine may lower sperm counts. Talk with your doctor or health care professional if you are concerned about your fertility. What side effects may I notice from receiving this medicine? Side effects that you should report to your doctor or health care professional as soon as possible:  allergic reactions like skin rash,  itching or hives, swelling of the face, lips, or tongue  breathing problems  pain, redness, or irritation at site where injected  signs and symptoms of a dangerous change in heartbeat or heart rhythm like chest pain; dizziness; fast or irregular heartbeat; palpitations; feeling faint or lightheaded, falls; breathing problems  signs of decreased platelets or bleeding - bruising, pinpoint red spots on the skin, black, tarry stools, blood in the urine  signs of decreased red blood cells - unusually weak or tired, feeling faint or lightheaded, falls  signs of infection - fever or chills, cough, sore throat, pain or difficulty passing urine  signs and symptoms of kidney injury like trouble passing urine or change in the amount of urine  signs and symptoms of liver injury like dark yellow or brown urine; general ill feeling or flu-like symptoms; light-colored stools; loss of appetite; nausea; right upper belly pain; unusually weak or tired; yellowing of the eyes or skin  swelling of ankles, feet, hands Side effects that usually do not require medical attention (report  to your doctor or health care professional if they continue or are bothersome):  constipation  diarrhea  hair loss  loss of appetite  nausea  rash  vomiting This list may not describe all possible side effects. Call your doctor for medical advice about side effects. You may report side effects to FDA at 1-800-FDA-1088. Where should I keep my medicine? This drug is given in a hospital or clinic and will not be stored at home. NOTE: This sheet is a summary. It may not cover all possible information. If you have questions about this medicine, talk to your doctor, pharmacist, or health care provider.  2020 Elsevier/Gold Standard (2017-03-26 18:06:11)  Carboplatin injection What is this medicine? CARBOPLATIN (KAR boe pla tin) is a chemotherapy drug. It targets fast dividing cells, like cancer cells, and causes these  cells to die. This medicine is used to treat ovarian cancer and many other cancers. This medicine may be used for other purposes; ask your health care provider or pharmacist if you have questions. COMMON BRAND NAME(S): Paraplatin What should I tell my health care provider before I take this medicine? They need to know if you have any of these conditions:  blood disorders  hearing problems  kidney disease  recent or ongoing radiation therapy  an unusual or allergic reaction to carboplatin, cisplatin, other chemotherapy, other medicines, foods, dyes, or preservatives  pregnant or trying to get pregnant  breast-feeding How should I use this medicine? This drug is usually given as an infusion into a vein. It is administered in a hospital or clinic by a specially trained health care professional. Talk to your pediatrician regarding the use of this medicine in children. Special care may be needed. Overdosage: If you think you have taken too much of this medicine contact a poison control center or emergency room at once. NOTE: This medicine is only for you. Do not share this medicine with others. What if I miss a dose? It is important not to miss a dose. Call your doctor or health care professional if you are unable to keep an appointment. What may interact with this medicine?  medicines for seizures  medicines to increase blood counts like filgrastim, pegfilgrastim, sargramostim  some antibiotics like amikacin, gentamicin, neomycin, streptomycin, tobramycin  vaccines Talk to your doctor or health care professional before taking any of these medicines:  acetaminophen  aspirin  ibuprofen  ketoprofen  naproxen This list may not describe all possible interactions. Give your health care provider a list of all the medicines, herbs, non-prescription drugs, or dietary supplements you use. Also tell them if you smoke, drink alcohol, or use illegal drugs. Some items may interact with  your medicine. What should I watch for while using this medicine? Your condition will be monitored carefully while you are receiving this medicine. You will need important blood work done while you are taking this medicine. This drug may make you feel generally unwell. This is not uncommon, as chemotherapy can affect healthy cells as well as cancer cells. Report any side effects. Continue your course of treatment even though you feel ill unless your doctor tells you to stop. In some cases, you may be given additional medicines to help with side effects. Follow all directions for their use. Call your doctor or health care professional for advice if you get a fever, chills or sore throat, or other symptoms of a cold or flu. Do not treat yourself. This drug decreases your body's ability to fight infections. Try to  avoid being around people who are sick. This medicine may increase your risk to bruise or bleed. Call your doctor or health care professional if you notice any unusual bleeding. Be careful brushing and flossing your teeth or using a toothpick because you may get an infection or bleed more easily. If you have any dental work done, tell your dentist you are receiving this medicine. Avoid taking products that contain aspirin, acetaminophen, ibuprofen, naproxen, or ketoprofen unless instructed by your doctor. These medicines may hide a fever. Do not become pregnant while taking this medicine. Women should inform their doctor if they wish to become pregnant or think they might be pregnant. There is a potential for serious side effects to an unborn child. Talk to your health care professional or pharmacist for more information. Do not breast-feed an infant while taking this medicine. What side effects may I notice from receiving this medicine? Side effects that you should report to your doctor or health care professional as soon as possible:  allergic reactions like skin rash, itching or hives,  swelling of the face, lips, or tongue  signs of infection - fever or chills, cough, sore throat, pain or difficulty passing urine  signs of decreased platelets or bleeding - bruising, pinpoint red spots on the skin, black, tarry stools, nosebleeds  signs of decreased red blood cells - unusually weak or tired, fainting spells, lightheadedness  breathing problems  changes in hearing  changes in vision  chest pain  high blood pressure  low blood counts - This drug may decrease the number of white blood cells, red blood cells and platelets. You may be at increased risk for infections and bleeding.  nausea and vomiting  pain, swelling, redness or irritation at the injection site  pain, tingling, numbness in the hands or feet  problems with balance, talking, walking  trouble passing urine or change in the amount of urine Side effects that usually do not require medical attention (report to your doctor or health care professional if they continue or are bothersome):  hair loss  loss of appetite  metallic taste in the mouth or changes in taste This list may not describe all possible side effects. Call your doctor for medical advice about side effects. You may report side effects to FDA at 1-800-FDA-1088. Where should I keep my medicine? This drug is given in a hospital or clinic and will not be stored at home. NOTE: This sheet is a summary. It may not cover all possible information. If you have questions about this medicine, talk to your doctor, pharmacist, or health care provider.  2020 Elsevier/Gold Standard (2007-04-07 14:38:05)

## 2019-11-24 NOTE — Telephone Encounter (Signed)
Scheduled appointments per 11/10 los. Spoke to patient who is aware of appointment date and time.

## 2019-11-24 NOTE — Progress Notes (Signed)
[facility] CSW Progress Note  Holiday representative met with patient and daughter, Otila Kluver, in exam room. Provided second set of [employer]. Patient had port placed yesterday and processed this experience briefly with CSW.   Patient has appt with counseling intern on Friday for additional emotional support.   CSW will check-in during next infusion.    Christeen Douglas , LCSW

## 2019-11-24 NOTE — Addendum Note (Signed)
Addendum  created 11/24/19 1142 by Tawni Millers, CRNA   [employer] section accepted

## 2019-11-25 ENCOUNTER — Ambulatory Visit: Payer: Medicare Other

## 2019-11-26 ENCOUNTER — Other Ambulatory Visit: Payer: Self-pay

## 2019-11-26 ENCOUNTER — Inpatient Hospital Stay: Payer: Medicare Other

## 2019-11-26 ENCOUNTER — Ambulatory Visit: Payer: Medicare Other

## 2019-11-26 VITALS — BP 118/64 | HR 66 | Temp 97.2°F

## 2019-11-26 DIAGNOSIS — C50412 Malignant neoplasm of upper-outer quadrant of left female breast: Secondary | ICD-10-CM

## 2019-11-26 DIAGNOSIS — Z170 Estrogen receptor positive status [ER+]: Secondary | ICD-10-CM

## 2019-11-26 DIAGNOSIS — Z5112 Encounter for antineoplastic immunotherapy: Secondary | ICD-10-CM | POA: Diagnosis not present

## 2019-11-26 MED ORDER — PEGFILGRASTIM-CBQV 6 MG/0.6ML ~~LOC~~ SOSY
PREFILLED_SYRINGE | SUBCUTANEOUS | Status: AC
  Filled 2019-11-26: qty 0.6

## 2019-11-26 MED ORDER — PEGFILGRASTIM-CBQV 6 MG/0.6ML ~~LOC~~ SOSY
6.0000 mg | PREFILLED_SYRINGE | Freq: Once | SUBCUTANEOUS | Status: AC
  Administered 2019-11-26: 6 mg via SUBCUTANEOUS
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
## 2019-11-26 NOTE — Patient Instructions (Signed)

## 2019-11-29 ENCOUNTER — Ambulatory Visit: Payer: Medicare Other

## 2019-11-30 ENCOUNTER — Telehealth: Payer: Self-pay

## 2019-11-30 ENCOUNTER — Other Ambulatory Visit: Payer: Self-pay | Admitting: Oncology

## 2019-11-30 ENCOUNTER — Ambulatory Visit: Payer: Medicare Other

## 2019-11-30 ENCOUNTER — Other Ambulatory Visit: Payer: Self-pay

## 2019-11-30 DIAGNOSIS — C50412 Malignant neoplasm of upper-outer quadrant of left female breast: Secondary | ICD-10-CM

## 2019-11-30 DIAGNOSIS — Z170 Estrogen receptor positive status [ER+]: Secondary | ICD-10-CM

## 2019-11-30 NOTE — Telephone Encounter (Signed)
Pt called and states she will come to lab and MD visit tomorrow as requested by Dr Jana Hakim.

## 2019-11-30 NOTE — Progress Notes (Signed)
[place]  [phone] [phone]) [phone]     ID: Felicia [name] DOB: 29-Jun-1950  MR#: [id number]  URK#:[id number]  Patient Care Team: Felicia Lass, MD as PCP - General (Family Medicine) Nahser, [name], MD as PCP - Cardiology (Cardiology) Felicia Kaufmann, RN as Oncology Nurse Navigator Felicia Germany, RN as Oncology Nurse Navigator Felicia Bookbinder, MD as Consulting Physician (General Surgery) Felicia [name], Felicia Dad, MD as Consulting Physician (Oncology) Felicia Gibson, MD as Attending Physician (Radiation Oncology) Felicia Pound, MD as Consulting Physician (Rheumatology) Felicia Cover, MD as Consulting Physician (Orthopedic Surgery) Felicia Pupa, DO as Consulting Physician (Obstetrics and Gynecology) Felicia Dresser, MD as Consulting Physician (Cardiology) Felicia Cruel, MD OTHER MD:  CHIEF COMPLAINT: Triple positive invasive breast cancer  CURRENT TREATMENT: Trastuzumab, gemcitabine, carboplatin   INTERVAL HISTORY: Felicia [name] returns today for follow up and treatment of her triple positive invasive breast cancer.    She began adjuvant chemotherapy, consisting of carboplatin, gemcitabine, trastuzumab, at her last visit on 11/24/2019. Today is day 8 cycle 1.  Her most recent echocardiogram from 09/15/2019 showed an ejection fraction of 60-65%.   REVIEW OF SYSTEMS: Felicia [name] did generally quite well with her first treatment.  She had a little bit of queasiness 1 day but no vomiting.  She felt tired but was able to continue to work full-time.  She did not have any bony aches or pains from her Udenyca shot on day 3.  She did have a little bit of foggy brain she said on day 6.  She was at work that day and she did okay.  She did develop some loose bowel movements, perhaps twice daily, not quite watery.  Usually she is constipated.  She has no trouble keeping herself well-hydrated.  She says her blood sugars are now considerably better.  She has a glucose  monitor on and she was started on Trulicity and taken off Glucophage which of course could make the diarrhea worse   COVID 19 VACCINATION STATUS:    HISTORY OF CURRENT ILLNESS: From the original intake note:  Felicia [name] had routine screening mammography on 08/03/2019 showing a possible abnormality in the left breast. She underwent left diagnostic mammography with tomography and left breast ultrasonography at [facility] on 08/17/2019 showing: breast density category B; 1.6 cm upper-outer left breast mass; no significant left axillary abnormalities.  Accordingly on 08/30/2019 she proceeded to biopsy of the left breast area in question. The pathology from this procedure ([id number]) showed: invasive mammary carcinoma, e-cadherin positive, grade 2. Prognostic indicators significant for: estrogen receptor, 100% positive and progesterone receptor, 40% positive, both with strong staining intensity. Proliferation marker Ki67 at 20%. HER2 equivocal by immunohistochemistry (2+), but negative by fluorescent in situ hybridization with a signals ratio   and number per cell  .  Of note, she also has a history of FIGO grade 3, stage IA endometrial carcinoma, diagnosed in 2014 in [place]. She underwent robotic assisted total hysterectomy with BSO and pelvic and perirectal aortic lymph node dissection as well as omental biopsy.  Tumor was found only in the endometrium arising in a polyp measuring 1.7 cm.  There was no myometrial invasion no lymphovascular space invasion, and no lower uterine segment involvement.  The cervix and adnexa, lymph nodes and omentum were all not involved.  She then received vaginal brachytherapy, no chemotherapy, completing treatment in the springtime of 2015. [Per patient's report, chemotherapy was suggested, but she opted against this.]  The patient's subsequent history is as  detailed below.   PAST MEDICAL HISTORY: Past Medical History:  Diagnosis Date  . Anemia   . Anxiety   .  Breast cancer ([place]) 09/2019   left breast IMC  . CAD (coronary artery disease)    a. 3/2007s/p DES to the LAD [facility]); b. 01/2017 MV: EF 80%, small, mild apical ant defect w/o ischemia (felt to be breast atten). Low risk.  . Depression   . DM (diabetes mellitus) ([place])   . Essential hypertension   . GERD (gastroesophageal reflux disease)   . Headache    secondary to neck surgery per patient  . High triglycerides   . Hyperlipidemia   . Hypothyroid   . OA (osteoarthritis)    right knee,hands  . Overflow incontinence   . PONV (postoperative nausea and vomiting)    s/p gallbladder   . RLS (restless legs syndrome)   . Urinary urgency   . Uterine cancer ([place]) dx'd 2014    PAST SURGICAL HISTORY: Past Surgical History:  Procedure Laterality Date  . BREAST LUMPECTOMY WITH RADIOACTIVE SEED AND SENTINEL LYMPH NODE BIOPSY Left 10/07/2019   Procedure: LEFT BREAST LUMPECTOMY WITH RADIOACTIVE SEED AND SENTINEL LYMPH NODE BIOPSY;  Surgeon: Felicia Bookbinder, MD;  Location: [facility];  Service: General;  Laterality: Left;  . CORONARY STENT PLACEMENT    . CORONARY/GRAFT ACUTE MI REVASCULARIZATION    . FINGER ARTHRODESIS Right 06/11/2019   Procedure: ARTHRODESIS INDEX FINGER DISTAL PHALANGEAL JOINT;  Surgeon: Felicia Cover, MD;  Location: [facility];  Service: Orthopedics;  Laterality: Right;  . FOOT SURGERY Left   . LEFT HEART CATH AND CORONARY ANGIOGRAPHY N/A 02/16/2018   Procedure: LEFT HEART CATH AND CORONARY ANGIOGRAPHY;  Surgeon: Lorretta Harp, MD;  Location: [facility];  Service: Cardiovascular;  Laterality: N/A;  . PORTACATH PLACEMENT Right 11/23/2019   Procedure: INSERTION PORT-A-CATH WITH ULTRASOUND GUIDANCE;  Surgeon: Felicia Bookbinder, MD;  Location: [facility];  Service: General;  Laterality: Right;  . REPLACEMENT TOTAL KNEE Right   . SPINE SURGERY    . TOTAL ABDOMINAL HYSTERECTOMY     FOR UTERUS CANCER  . TOTAL HIP  ARTHROPLASTY Left     FAMILY HISTORY: Family History  Problem Relation Age of Onset  . AAA (abdominal aortic aneurysm) Mother   . Diabetes Father   . Alzheimer's disease Father   . Throat cancer Sister   . Breast cancer Cousin    Her father died at age 43 from Alzheimer's. Her mother died at age 60 from a ruptured abdominal aneurysm. Jermiya has 2 brothers and 3 sisters. She reports breast cancer in a paternal cousin. She also notes throat cancer in her sister.   GYNECOLOGIC HISTORY:  No LMP recorded. Patient has had a hysterectomy. Menarche: 69 years old Age at first live birth: 69 years old [place] P 2 LMP 1996 Contraceptive: prior use without issue HRT never used  Hysterectomy? Yes, 2014 BSO? 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  She unpacks 40 pounds boxes of chicken, has to cook them in 13 [name] cooking pans, and is generally on her feet at work but does not otherwise exercise.  She is divorced. She lives at home by herself, with no pets. Daughter Pershing Proud, age 34, is a radiology tech in [place]. Daughter Martyn Malay, age 27, is an Optometrist in [place], [place]. Brodie has one grandchild. She is not a Ambulance person.  ADVANCED DIRECTIVES: Not in place; intends to name daughter [name] as her HCPOA.   HEALTH MAINTENANCE: Social History   Tobacco Use  . Smoking status: Never Smoker  . Smokeless tobacco: Never Used  Vaping Use  . Vaping Use: Never used  Substance Use Topics  . Alcohol use: Yes    Comment: OCCASIONALLY  . Drug use: No     Colonoscopy: <10 years ago, in [place]  PAP: 2014?  Bone density: date unsure, also in [place]   Allergies  Allergen Reactions  . Chloraprep One Step [Chlorhexidine Gluconate] Rash  . Estrogens Other (See Comments)    PATIENT HAS A HISTORY OF CANCER AND HAS BEEN TOLD TO NEVER TAKE ANYTHING CONTAINING ESTROGEN, AS IT MIGHT CAUSE A RECURRENCE  . Shrimp [Shellfish Allergy]  Nausea And Vomiting    Current Outpatient Medications  Medication Sig Dispense Refill  . ACCU-CHEK GUIDE test strip 1 each by Other route as needed.    . Ascorbic Acid (VITAMIN C PO) Take 1 tablet by mouth daily.    Marland Kitchen aspirin EC 81 MG tablet Take 81 mg by mouth daily.    Marland Kitchen atorvastatin (LIPITOR) 40 MG tablet TAKE 1 TABLET BY MOUTH EVERY DAY 90 tablet 1  . bisoprolol (ZEBETA) 5 MG tablet TAKE 1 TABLET BY MOUTH EVERY DAY 90 tablet 2  . Calcium Citrate-Vitamin D (CALCIUM + D PO) Take 1 tablet by mouth daily with supper.    Marland Kitchen CINNAMON PO Take 1 capsule by mouth daily with supper.    . diclofenac sodium (VOLTAREN) 1 % GEL Apply 2-4 g topically 4 (four) times daily as needed (as directed for pain).    Marland Kitchen diclofenac Sodium (VOLTAREN) 1 % GEL Apply 1 application topically 3 (three) times daily as needed.    Marland Kitchen escitalopram (LEXAPRO) 10 MG tablet Take 10 mg by mouth daily.    . Ferrous Sulfate (IRON PO) Take 1 tablet by mouth daily. alternates days:1 tablet one day and 2 tablets the next day    . ferrous sulfate 325 (65 FE) MG EC tablet Take 325 mg by mouth 3 (three) times daily with meals.    Marland Kitchen GLIPIZIDE XL 10 MG 24 hr tablet Take 20 mg by mouth daily.   3  . JARDIANCE 25 MG TABS tablet Take 25 mg by mouth every morning.   3  . levothyroxine (SYNTHROID, LEVOTHROID) 125 MCG tablet Take 125 mcg by mouth daily before breakfast.    . lidocaine-prilocaine (EMLA) cream Apply to affected area once 30 g 3  . metFORMIN (GLUCOPHAGE) 500 MG tablet Take 2 tablets (1,000 mg total) by mouth See admin instructions. Take 1,000 mg by mouth 2 times a day- with breakfast and supper    . metroNIDAZOLE (METROGEL) 1 % gel Apply 1 application topically as needed (as directed to affected area).     . Multiple Vitamins-Calcium (ONE-A-DAY WOMENS PO) Take 1 tablet by mouth daily.    . nitroGLYCERIN (NITROSTAT) 0.4 MG SL tablet Place 1 tablet (0.4 mg total) under the tongue every 5 (five) minutes as needed for chest pain. 25  tablet 6  . omeprazole (PRILOSEC) 40 MG capsule Take 40 mg by mouth daily with supper.     . ondansetron (ZOFRAN) 8 MG tablet Take 1 tablet (8 mg total) by mouth 2 (two) times daily as needed for refractory nausea / vomiting. Start on day 3 after chemo. 30 tablet 1  . prochlorperazine (COMPAZINE) 10 MG tablet Take 1 tablet by mouth before meals  and at bedtime on days 2 and 3 after chemotherapy (counting chemotherapy day as day 1); after that may take as needed 30 tablet 1  . rOPINIRole (REQUIP) 0.5 MG tablet Take 0.5 mg by mouth at bedtime. 1-3 hours prior to bedtime    . tiZANidine (ZANAFLEX) 4 MG tablet Take 4 mg by mouth 3 (three) times daily. As needed    . TURMERIC PO Take 1 capsule by mouth daily with supper.     No current facility-administered medications for this visit.    OBJECTIVE: White woman in no acute distress  Vitals:   12/01/19 0855  BP: (!) 130/57  Pulse: (!) 49  Resp: 18  Temp: (!) 97 F (36.1 C)  SpO2: 99%     Body mass index is 30.35 kg/m.   Wt Readings from Last 3 Encounters:  12/01/19 176 lb 12.8 oz (80.2 kg)  11/24/19 181 lb (82.1 kg)  11/23/19 176 lb 9.4 oz (80.1 kg)      ECOG FS:1 - Symptomatic but completely ambulatory  Sclerae unicteric, EOMs intact Wearing a mask No cervical or supraclavicular adenopathy Lungs no rales or rhonchi Heart regular rate and rhythm Abd soft, nontender, positive bowel sounds MSK no focal spinal tenderness, no upper extremity lymphedema Neuro: nonfocal, well oriented, appropriate affect Breasts: The right breast is benign.  The left breast is status post lumpectomy.  The incisions have healed very nicely.  Both axillae are benign   LAB RESULTS:  CMP     Component Value Date/Time   NA 138 11/24/2019 0821   NA 139 11/25/2018 1053   K 4.1 11/24/2019 0821   CL 103 11/24/2019 0821   CO2 22 11/24/2019 0821   GLUCOSE 232 (H) 11/24/2019 0821   BUN 18 11/24/2019 0821   BUN 18 11/25/2018 1053   CREATININE 0.83  11/24/2019 0821   CALCIUM 10.1 11/24/2019 0821   PROT 6.5 11/24/2019 0821   PROT 7.1 11/25/2018 1053   ALBUMIN 3.5 11/24/2019 0821   ALBUMIN 4.2 11/25/2018 1053   AST 11 (L) 11/24/2019 0821   ALT 15 11/24/2019 0821   ALKPHOS 42 11/24/2019 0821   BILITOT 0.4 11/24/2019 0821   GFRNONAA >60 11/24/2019 0821   GFRAA >60 10/04/2019 1330   GFRAA >60 09/08/2019 0811    No results found for: TOTALPROTELP, ALBUMINELP, A1GS, A2GS, BETS, BETA2SER, GAMS, MSPIKE, SPEI  Lab Results  Component Value Date   WBC 13.3 (H) 12/01/2019   NEUTROABS PENDING 12/01/2019   HGB 12.1 12/01/2019   HCT 37.4 12/01/2019   MCV 92.6 12/01/2019   PLT 119 (L) 12/01/2019    No results found for: LABCA2  No components found for: [id number]  No results for input(s): INR in the last 168 hours.  No results found for: LABCA2  No results found for: KCL275  No results found for: TZG017  No results found for: CBS496  No results found for: CA2729  No components found for: HGQUANT  No results found for: CEA1 / No results found for: CEA1   No results found for: AFPTUMOR  No results found for: CHROMOGRNA  No results found for: KPAFRELGTCHN, LAMBDASER, KAPLAMBRATIO (kappa/lambda light chains)  No results found for: HGBA, HGBA2QUANT, HGBFQUANT, HGBSQUAN (Hemoglobinopathy evaluation)   No results found for: LDH  No results found for: IRON, TIBC, IRONPCTSAT (Iron and TIBC)  No results found for: FERRITIN  Urinalysis No results found for: COLORURINE, APPEARANCEUR, LABSPEC, PHURINE, GLUCOSEU, HGBUR, BILIRUBINUR, KETONESUR, PROTEINUR, UROBILINOGEN, NITRITE, LEUKOCYTESUR   STUDIES: DG Chest  1 View  Result Date: 11/23/2019 CLINICAL DATA:  Chest wall port placement EXAM: CHEST  1 VIEW; DG C-ARM 1-60 MIN COMPARISON:  None. FLUOROSCOPY TIME:  Fluoroscopy Time:  16 seconds Radiation Exposure Index (if provided by the fluoroscopic device): 2.09 mGy Number of Acquired Spot Images: 1 FINDINGS: Single spot film  reveals the right-sided chest wall port with catheter tip at the cavoatrial junction. No definitive pneumothorax is seen although the upper lung is incompletely evaluated on this exam. IMPRESSION: Status post right port placement without definitive pneumothorax. Electronically Signed   By: Inez Catalina M.D.   On: 11/23/2019 13:14   DG C-Arm 1-60 Min  Result Date: 11/23/2019 CLINICAL DATA:  Chest wall port placement EXAM: CHEST  1 VIEW; DG C-ARM 1-60 MIN COMPARISON:  None. FLUOROSCOPY TIME:  Fluoroscopy Time:  16 seconds Radiation Exposure Index (if provided by the fluoroscopic device): 2.09 mGy Number of Acquired Spot Images: 1 FINDINGS: Single spot film reveals the right-sided chest wall port with catheter tip at the cavoatrial junction. No definitive pneumothorax is seen although the upper lung is incompletely evaluated on this exam. IMPRESSION: Status post right port placement without definitive pneumothorax. Electronically Signed   By: Inez Catalina M.D.   On: 11/23/2019 13:14     ELIGIBLE FOR AVAILABLE RESEARCH PROTOCOL: AET  ASSESSMENT: 69 y.o. [place] woman status post left breast upper outer quadrant biopsy 08/30/2019 for a clinical T1c N0, stage IA invasive ductal carcinoma, grade 2, estrogen and progesterone receptor positive, HER-2 amplified, with an MIB-1 of 20%.  (0) status post TAH/BSO 2014 for a FIGO grade 3, stage IA endometrial carcinoma, status post vaginal brachytherapy, no adjuvant chemotherapy  (1) status post left lumpectomy and axillary sentinel node sampling 10/07/2019 for a pT1c pN1, stage IB invasive ductal carcinoma, grade 2, with negative margins.  (a) a total of 1 sentinel lymph node was removed.  (2) adjuvant chemotherapy to consist of carboplatin, gemcitabine, trastuzumab  (a) baseline echo 09/15/2019 shows an ejection fraction in the 60-65% range.  (b) will not use docetaxel secondary to severe peripheral neuropathy present at baseline  (3) trastuzumab to be  continued to complete a year  (4) adjuvant radiation to follow  (5) antiestrogens to start at the completion of local treatment   PLAN: Mike tolerated her first cycle of chemotherapy quite well.  We have arranged her treatment so that it would not worsen her diabetes.  She now has a more aggressive diabetic treatment and is getting better numbers.  She is also meeting with a nutritionist she says.  I am making no changes in her treatment.  I do not think she will need to be seen on day 8 since her white cell count is excellent.  I would see her on each treatment today just to make sure everything is in place and I have entered all her 6 chemotherapy treatments into her schedule.  She knows to call for any other problem that may develop before the next visit  Total encounter time 25 minutes.Sarajane Jews C. Raelee Rossmann, MD 12/01/2019 9:06 AM Medical Oncology and Hematology [facility] [place], [place] [address] Tel. [phone]    Fax. [phone]   This document serves as a record of services personally performed by Lurline Del, MD. It was created on his behalf by Wilburn Mylar, a trained medical scribe. The creation of this record is based on the scribe's personal observations and the provider's statements to them.   I,  Lurline Del MD, have reviewed the above documentation for accuracy and completeness, and I agree with the above.   *Total Encounter Time as defined by the Centers for Medicare and Medicaid Services includes, in addition to the face-to-face time of a patient visit (documented in the note above) non-face-to-face time: obtaining and reviewing outside history, ordering and reviewing medications, tests or procedures, care coordination (communications with other health care professionals or caregivers) and documentation in the medical record.

## 2019-11-30 NOTE — Telephone Encounter (Signed)
Pt returned Dr Magrinat's call and LVM for [place] to call her. This LPN called and LVM for pt to call us regarding her lab appt. Let pt know we will need labs and if she is not able to come to [facility] she will need to have labs drawn at her diabetic appt and for pt to call us back today so I can send orders there if she wishes to have labs done there.

## 2019-11-30 NOTE — Progress Notes (Signed)
Patient wanted to cancel appointments tomorrow.  I left her a voicemail saying that I need labs.  She is seeing Dr. Wynetta Emery tomorrow as well and perhaps Dr. Wynetta Emery could obtain a CBC and differential if Felicia [name] does not want to come here as scheduled at 8:30 in the morning.  I did emphasize that I need to know what her nadir counts are so I can adjust her chemo dose in  subsequent treatments

## 2019-11-30 NOTE — Telephone Encounter (Signed)
Pt called to ask about appt for 11/17 and why she has an appt. Referred to last note with Dr Jana Hakim and explained to pt. Pt states she does not want to come in and is feeling fine other than being tired. Pt requests to cancel appts. Will make Dr Jana Hakim aware.

## 2019-12-01 ENCOUNTER — Ambulatory Visit: Payer: Medicare Other

## 2019-12-01 ENCOUNTER — Other Ambulatory Visit: Payer: Self-pay

## 2019-12-01 ENCOUNTER — Inpatient Hospital Stay: Payer: Medicare Other

## 2019-12-01 ENCOUNTER — Encounter: Payer: Medicare Other | Attending: Family Medicine | Admitting: Registered"

## 2019-12-01 ENCOUNTER — Telehealth: Payer: Self-pay | Admitting: Oncology

## 2019-12-01 ENCOUNTER — Inpatient Hospital Stay (HOSPITAL_BASED_OUTPATIENT_CLINIC_OR_DEPARTMENT_OTHER): Payer: Medicare Other | Admitting: Oncology

## 2019-12-01 VITALS — BP 130/57 | HR 49 | Temp 97.0°F | Resp 18 | Ht 64.0 in | Wt 176.8 lb

## 2019-12-01 DIAGNOSIS — E119 Type 2 diabetes mellitus without complications: Secondary | ICD-10-CM | POA: Insufficient documentation

## 2019-12-01 DIAGNOSIS — Z170 Estrogen receptor positive status [ER+]: Secondary | ICD-10-CM

## 2019-12-01 DIAGNOSIS — C50412 Malignant neoplasm of upper-outer quadrant of left female breast: Secondary | ICD-10-CM | POA: Diagnosis not present

## 2019-12-01 DIAGNOSIS — Z5112 Encounter for antineoplastic immunotherapy: Secondary | ICD-10-CM | POA: Diagnosis not present

## 2019-12-01 LAB — CMP (CANCER CENTER ONLY)
ALT: 48 U/L — ABNORMAL HIGH (ref 0–44)
AST: 34 U/L (ref 15–41)
Albumin: 3.6 g/dL (ref 3.5–5.0)
Alkaline Phosphatase: 70 U/L (ref 38–126)
Anion gap: 10 (ref 5–15)
BUN: 13 mg/dL (ref 8–23)
CO2: 25 mmol/L (ref 22–32)
Calcium: 9.2 mg/dL (ref 8.9–10.3)
Chloride: 106 mmol/L (ref 98–111)
Creatinine: 0.62 mg/dL (ref 0.44–1.00)
GFR, Estimated: >60 mL/min (ref >60)
Glucose, Bld: 124 mg/dL — ABNORMAL HIGH (ref 70–99)
Potassium: 3.7 mmol/L (ref 3.5–5.1)
Sodium: 141 mmol/L (ref 135–145)
Total Bilirubin: 0.3 mg/dL (ref 0.3–1.2)
Total Protein: 6.8 g/dL (ref 6.5–8.1)

## 2019-12-01 LAB — CBC WITH DIFFERENTIAL (CANCER CENTER ONLY)
Abs Immature Granulocytes: 0.13 10*3/uL — ABNORMAL HIGH (ref 0.00–0.07)
Basophils Absolute: 0 10*3/uL (ref 0.0–0.1)
Basophils Relative: 0 %
Eosinophils Absolute: 0.1 10*3/uL (ref 0.0–0.5)
Eosinophils Relative: 1 %
HCT: 37.4 % (ref 36.0–46.0)
Hemoglobin: 12.1 g/dL (ref 12.0–15.0)
Immature Granulocytes: 1 %
Lymphocytes Relative: 45 %
Lymphs Abs: 6 10*3/uL — ABNORMAL HIGH (ref 0.7–4.0)
MCH: 30 pg (ref 26.0–34.0)
MCHC: 32.4 g/dL (ref 30.0–36.0)
MCV: 92.6 fL (ref 80.0–100.0)
Monocytes Absolute: 0.6 10*3/uL (ref 0.1–1.0)
Monocytes Relative: 5 %
Neutro Abs: 6.4 10*3/uL (ref 1.7–7.7)
Neutrophils Relative %: 48 %
Platelet Count: 119 10*3/uL — ABNORMAL LOW (ref 150–400)
RBC: 4.04 MIL/uL (ref 3.87–5.11)
RDW: 14.9 % (ref 11.5–15.5)
WBC Count: 13.3 10*3/uL — ABNORMAL HIGH (ref 4.0–10.5)
nRBC: 0 % (ref 0.0–0.2)

## 2019-12-01 NOTE — Patient Instructions (Addendum)
Get the sweet potato tots instead of white potato to get beta carotene and vitamin A Instead of adding M&Ms to nuts to make a trail mix, put in a little bit of raisins or craisins.  Include vegetables in your diet every day. Spinach, broccoli, cabbage Since you are craving salads Corelife has really fresh stuff.  Carbon tea with milk and cinnamon has a lot of flavor without added sugar.  Check your blood sugar at bedtime and other times as your doctor instructed. Continue with your plan to journal your food and other symptoms that your doctor wants  Make time to do things you enjoy - making ornaments sounds like a great idea.

## 2019-12-01 NOTE — Progress Notes (Signed)
Diabetes Self-Management Education  Visit Type: Follow-up  Appt. Start Time: 1530 Appt. End Time: 1630  12/01/2019  [name], identified by name and date of birth, is a 69 y.o. female with a diagnosis of Diabetes: Type 2.   ASSESSMENT  There were no vitals taken for this visit. There is no height or weight on file to calculate BMI.   Pt states she did well with first chemo treatment, but is confused by weight gain. RD asked patient not to focus on weight while on chemo, but aim to eat foods high in vitamins and good nutrition to support her body while going through chemo.  Pt states she was told by her new endocrinologist to stop eating sugar. Patient is not ready for this change yet. Today we focused on trying to make small nudges toward fruit instead of other sweets. Pt reports she routinely goes to [facility] after getting blood work and does not ask for sugar free syrup.  Pt states she really enjoys chocolate. RD provided sample chocolate Slim vegan protein bars 3 g sugar and advised that she try just half the bar to make sure it sets well on her stomach.  Patient states she bought oatmeal and whole grain bread after last visit. Pt states she threw out the bread and hasn't tried the oatmeal yet. Pt states she bought walnuts, cashews, and peanuts and thinking about making her own trail mix.  Pt is wearing a CGM that her MD will check her patterns over 2 weeks. Pt reports FBS 112 today, 137 yesterday   Diabetes Self-Management Education - 12/01/19 1543      Visit Information   Visit Type Follow-up      Initial Visit   Diabetes Type Type 2      Complications   Last HgB A1C per patient/outside source 8.2 %   pt reports it was updated this week     Dietary Intake   Breakfast 2 eggs, waffle, bacon, tea, splenda    Lunch cup cake    Dinner english muffin, smart balance    Snack (evening) apples cheese to take medicine    Beverage(s) tea with splenda      Exercise    Exercise Type Light (walking / raking leaves)   occassional arm chair exercises     Patient Education   Nutrition management  Meal options for control of blood glucose level and chronic complications.;Other (comment)   foods to boost vitamin and phytonutrient intake     Individualized Goals (developed by patient)   Nutrition General guidelines for healthy choices and portions discussed    Monitoring  send in my blood glucose log as discussed    Health Coping Other (comment)   offered support group, pt not interested in online format     Patient Self-Evaluation of Goals - Patient rates self as meeting previously set goals (% of time)   Nutrition < 25%   bought some of the foods, but not incorporating     Outcomes   Expected Outcomes Other (comment)    Future DMSE 3-4 months    Program Status Not Completed      Subsequent Visit   Since your last visit have you continued or begun to take your medications as prescribed? Yes   11/15 started with Endocrinologist and med mgmt   Since your last visit have you experienced any weight changes? Gain    Weight Gain (lbs) 5   patient thinks its water weight, on  chemo tx          Individualized Plan for Diabetes Self-Management Training:   Learning Objective:  Patient will have a greater understanding of diabetes self-management. Patient education plan is to attend individual and/or group sessions per assessed needs and concerns.   Patient Instructions  Get the sweet potato tots instead of white potato to get beta carotene and vitamin A Instead of adding M&Ms to nuts to make a trail mix, put in a little bit of raisins or craisins.  Include vegetables in your diet every day. Spinach, broccoli, cabbage Since you are craving salads [employer] has really fresh stuff.  [place] tea with milk and cinnamon has a lot of flavor without sugar.  Check your blood sugar at bedtime and other times as your doctor instructed. Continue with your plan to  journal your food and other symptoms that your doctor wants  Make time to do things you enjoy - making ornaments sounds like a great idea.   Expected Outcomes:  Other (comment)  Education material provided: Following the [place] (page 4) lists of food groups with modifications crossing out concentrated carbs  If problems or questions, patient to contact team via:  Phone  Future DSME appointment: 3-4 months

## 2019-12-01 NOTE — Telephone Encounter (Signed)
Scheduled appts per 11/17 los. Gave pt a print out of AVS.

## 2019-12-02 ENCOUNTER — Ambulatory Visit: Payer: Medicare Other

## 2019-12-02 ENCOUNTER — Encounter: Payer: Self-pay | Admitting: *Deleted

## 2019-12-03 ENCOUNTER — Ambulatory Visit: Payer: Medicare Other

## 2019-12-03 NOTE — Progress Notes (Signed)
[place] Patient and [facility] Counseling Note    Patient first informed counselor of medical updates and complained of tension in her body. Patient described figuring out her work schedule and her shift in perspective since first round of cancer. Patient spends time thinking about how her tx will affect her. She also described family dynamics, her upcoming camping trip, and her love of crafts. Patient acknowledged "bouncing" around a lot, which was useful for rapport building and information gathering. Counselor asked patient about goals, and the two discussed potential areas of growth. In session, patient was oriented times three and showed no ideation. Her mood was neutral and tired with a congruent affect. Patient appears to be lonely and misses connecting with people the way she used to. COVID has made it challenging to connect with people and her tx makes it harder to afford to do the things she likes, which has put the patient in some distress. Future sessions are on hold until January, but will continue to assess and build rapport    Gaylyn Rong Counseling Intern

## 2019-12-06 ENCOUNTER — Ambulatory Visit: Payer: Medicare Other | Admitting: Radiation Oncology

## 2019-12-06 ENCOUNTER — Ambulatory Visit: Payer: Medicare Other

## 2019-12-07 ENCOUNTER — Ambulatory Visit: Payer: Medicare Other

## 2019-12-07 ENCOUNTER — Telehealth: Payer: Self-pay | Admitting: Family Medicine

## 2019-12-07 NOTE — Telephone Encounter (Signed)
   Felicia [name] DOB: August 11, 1950 MRN: [id number]   RIDER WAIVER AND RELEASE OF LIABILITY  For purposes of improving physical access to our facilities, Felicia [name] is pleased to partner with third parties to provide Felicia [name] patients or other authorized individuals the option of convenient, on-demand ground transportation [name] (the Felicia [name]") through use of the technology service that enables users to request on-demand ground transportation from independent third-party providers.  By opting to use and accept these Felicia [name], I, the undersigned, hereby agree on behalf of myself, and on behalf of any minor child using the Felicia [name] for whom I am the parent or legal guardian, as follows:  1. Government social research officer provided to me are provided by independent third-party transportation providers who are not Felicia [name] or employees and who are unaffiliated with Felicia [name]. 2. Felicia [name] is neither a transportation carrier nor a common or public carrier. 3. Felicia [name] has no control over the quality or safety of the transportation that occurs as a result of the Felicia [name]. 4. Felicia [name] cannot guarantee that any third-party transportation provider will complete any arranged transportation service. 5. Felicia [name] makes no representation, warranty, or guarantee regarding the reliability, timeliness, quality, safety, suitability, or availability of any of the Felicia [name] or that they will be error free. 6. I fully understand that traveling by vehicle involves risks and dangers of serious bodily injury, including permanent disability, paralysis, and death. I agree, on behalf of myself and on behalf of any minor child using the Felicia [name] for whom I am the parent or legal guardian, that the entire risk arising out of my use of the Felicia [name] remains solely with me, to the maximum extent permitted under applicable law. 7. The [employer] are provided "as is" and "as available." [facility] disclaims all representations and warranties, express, implied or statutory, not expressly set out in these terms, including the implied warranties of merchantability and fitness for a particular purpose. 8. I hereby waive and release Felicia [name], its agents, employees, officers, directors, representatives, insurers, attorneys, assigns, successors, subsidiaries, and affiliates from any and all past, present, or future claims, demands, liabilities, actions, causes of action, or suits of any kind directly or indirectly arising from acceptance and use of the Felicia [name]. 9. I further waive and release Felicia [name] and its affiliates from all present and future liability and responsibility for any injury or death to persons or damages to property caused by or related to the use of the Felicia [name]. 10. I have read this Waiver and Release of Liability, and I understand the terms used in it and their legal significance. This Waiver is freely and voluntarily given with the understanding that my right (as well as the right of any minor child for whom I am the parent or legal guardian using the Felicia [name]) to legal recourse against [facility] in connection with the Felicia [name] is knowingly surrendered in return for use of these [name].   I attest that I read the consent document to Felicia [name], gave Felicia [name] the opportunity to ask questions and answered the questions asked (if any). I affirm that Felicia [name] then provided consent for she's participation in this program.     Felicia [name]

## 2019-12-08 ENCOUNTER — Ambulatory Visit: Payer: Medicare Other

## 2019-12-13 ENCOUNTER — Ambulatory Visit: Payer: Medicare Other

## 2019-12-14 ENCOUNTER — Other Ambulatory Visit: Payer: Self-pay | Admitting: Nurse Practitioner

## 2019-12-14 ENCOUNTER — Ambulatory Visit: Payer: Medicare Other

## 2019-12-14 ENCOUNTER — Other Ambulatory Visit: Payer: Self-pay | Admitting: Oncology

## 2019-12-14 DIAGNOSIS — Z170 Estrogen receptor positive status [ER+]: Secondary | ICD-10-CM

## 2019-12-14 DIAGNOSIS — C50412 Malignant neoplasm of upper-outer quadrant of left female breast: Secondary | ICD-10-CM

## 2019-12-14 NOTE — Progress Notes (Signed)
[place]   [phone] [phone]) [phone]   Clinic Follow up Note   Patient Care Team: Kathyrn Lass, MD as PCP - General (Family Medicine) Nahser, [name], MD as PCP - Cardiology (Cardiology) Mauro Kaufmann, RN as Oncology Nurse Navigator Rockwell Germany, RN as Oncology Nurse Navigator Rolm Bookbinder, MD as Consulting Physician (General Surgery) Magrinat, Virgie Dad, MD as Consulting Physician (Oncology) Eppie Gibson, MD as Attending Physician (Radiation Oncology) [name], MD as Consulting Physician (Rheumatology) [name], MD as Consulting Physician (Orthopedic Surgery) Janyth Pupa, DO as Consulting Physician (Obstetrics and Gynecology) Larey Dresser, MD as Consulting Physician (Cardiology) 12/15/2019  CHIEF COMPLAINT: Follow-up left breast cancer  CURRENT THERAPY: Trastuzumab, gemcitabine, carboplatin q. 21 days x 6  INTERVAL HISTORY: Ms. Santizo returns for follow-up and treatment as scheduled.  She received cycle 1 carboplatin, gemcitabine, and trastuzumab on 11/10 and Udenyca on 11/12.  She has fatigue but remains functional, working 4-5 hours Sunday, Tuesday, Thursday, Saturday.  Intermittent diarrhea managed with Imodium.  Very mild nausea for 1 day after treatment, no emesis.  Took antiemetics for less than a week.  Denies mucositis.  Appetite is adequate.  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  Denies any fever, chills, cough, chest pain, dyspnea, or rash.   MEDICAL HISTORY:  Past Medical History:  Diagnosis Date  . Anemia   . Anxiety   . Breast cancer ([place]) 09/2019   left breast IMC  . CAD (coronary artery disease)    a. 3/2007s/p DES to the LAD [facility]); b. 01/2017 MV: EF 80%, small, mild apical ant defect w/o ischemia (felt to be breast atten). Low risk.  . Depression   . DM (diabetes mellitus) ([place])   . Essential hypertension   . GERD (gastroesophageal reflux disease)   . Headache    secondary to neck surgery per  patient  . High triglycerides   . Hyperlipidemia   . Hypothyroid   . OA (osteoarthritis)    right knee,hands  . Overflow incontinence   . PONV (postoperative nausea and vomiting)    s/p gallbladder   . RLS (restless legs syndrome)   . Urinary urgency   . Uterine cancer ([place]) dx'd 2014    SURGICAL HISTORY: Past Surgical History:  Procedure Laterality Date  . BREAST LUMPECTOMY WITH RADIOACTIVE SEED AND SENTINEL LYMPH NODE BIOPSY Left 10/07/2019   Procedure: LEFT BREAST LUMPECTOMY WITH RADIOACTIVE SEED AND SENTINEL LYMPH NODE BIOPSY;  Surgeon: Rolm Bookbinder, MD;  Location: [place];  Service: General;  Laterality: Left;  . CORONARY STENT PLACEMENT    . CORONARY/GRAFT ACUTE MI REVASCULARIZATION    . FINGER ARTHRODESIS Right 06/11/2019   Procedure: ARTHRODESIS INDEX FINGER DISTAL PHALANGEAL JOINT;  Surgeon: [name], MD;  Location: [facility];  Service: Orthopedics;  Laterality: Right;  . FOOT SURGERY Left   . LEFT HEART CATH AND CORONARY ANGIOGRAPHY N/A 02/16/2018   Procedure: LEFT HEART CATH AND CORONARY ANGIOGRAPHY;  Surgeon: Lorretta Harp, MD;  Location: [place] [facility];  Service: Cardiovascular;  Laterality: N/A;  . PORTACATH PLACEMENT Right 11/23/2019   Procedure: INSERTION PORT-A-CATH WITH ULTRASOUND GUIDANCE;  Surgeon: Rolm Bookbinder, MD;  Location: [facility];  Service: General;  Laterality: Right;  . REPLACEMENT TOTAL KNEE Right   . SPINE SURGERY    . TOTAL ABDOMINAL HYSTERECTOMY     FOR UTERUS CANCER  . TOTAL HIP ARTHROPLASTY Left     I have reviewed the social history and  family history with the patient and they are unchanged from previous note.  ALLERGIES:  is allergic to chloraprep one step [chlorhexidine gluconate], estrogens, and shrimp [shellfish allergy].  MEDICATIONS:  Current Outpatient Medications  Medication Sig Dispense Refill  . ACCU-CHEK GUIDE test strip 1 each by Other route as needed.    .  Ascorbic Acid (VITAMIN C PO) Take 1 tablet by mouth daily.    Marland Kitchen aspirin EC 81 MG tablet Take 81 mg by mouth daily.    Marland Kitchen atorvastatin (LIPITOR) 40 MG tablet TAKE 1 TABLET BY MOUTH EVERY DAY 90 tablet 1  . bisoprolol (ZEBETA) 5 MG tablet TAKE 1 TABLET BY MOUTH EVERY DAY 90 tablet 2  . Calcium Citrate-Vitamin D (CALCIUM + D PO) Take 1 tablet by mouth daily with supper.    Marland Kitchen CINNAMON PO Take 1 capsule by mouth daily with supper.    . diclofenac sodium (VOLTAREN) 1 % GEL Apply 2-4 g topically 4 (four) times daily as needed (as directed for pain).    Marland Kitchen diclofenac Sodium (VOLTAREN) 1 % GEL Apply 1 application topically 3 (three) times daily as needed.    . Dulaglutide (TRULICITY) 1.5 QZ/0.0PQ SOPN Inject 1.5 mg into the skin once a week.    . escitalopram (LEXAPRO) 10 MG tablet Take 10 mg by mouth daily.    . Ferrous Sulfate (IRON PO) Take 1 tablet by mouth daily. alternates days:1 tablet one day and 2 tablets the next day    . ferrous sulfate 325 (65 FE) MG EC tablet Take 325 mg by mouth 3 (three) times daily with meals.    Marland Kitchen GLIPIZIDE XL 10 MG 24 hr tablet Take 20 mg by mouth daily.   3  . JARDIANCE 25 MG TABS tablet Take 25 mg by mouth every morning.   3  . levothyroxine (SYNTHROID, LEVOTHROID) 125 MCG tablet Take 125 mcg by mouth daily before breakfast.    . lidocaine-prilocaine (EMLA) cream Apply to affected area once 30 g 3  . metroNIDAZOLE (METROGEL) 1 % gel Apply 1 application topically as needed (as directed to affected area).     . Multiple Vitamins-Calcium (ONE-A-DAY WOMENS PO) Take 1 tablet by mouth daily.    . nitroGLYCERIN (NITROSTAT) 0.4 MG SL tablet Place 1 tablet (0.4 mg total) under the tongue every 5 (five) minutes as needed for chest pain. 25 tablet 6  . omeprazole (PRILOSEC) 40 MG capsule Take 40 mg by mouth daily with supper.     . ondansetron (ZOFRAN) 8 MG tablet Take 1 tablet (8 mg total) by mouth 2 (two) times daily as needed for refractory nausea / vomiting. Start on day 3  after chemo. 30 tablet 1  . prochlorperazine (COMPAZINE) 10 MG tablet Take 1 tablet by mouth before meals and at bedtime on days 2 and 3 after chemotherapy (counting chemotherapy day as day 1); after that may take as needed 30 tablet 1  . rOPINIRole (REQUIP) 0.5 MG tablet Take 0.5 mg by mouth at bedtime. 1-3 hours prior to bedtime    . tiZANidine (ZANAFLEX) 4 MG tablet Take 4 mg by mouth 3 (three) times daily. As needed    . TURMERIC PO Take 1 capsule by mouth daily with supper.     No current facility-administered medications for this visit.   Facility-Administered Medications Ordered in Other Visits  Medication Dose Route Frequency Provider Last Rate Last Admin  . CARBOplatin (PARAPLATIN) 460 mg in sodium chloride 0.9 % 250 mL chemo infusion  460  mg Intravenous Once Magrinat, Virgie Dad, MD      . fosaprepitant (EMEND) 150 mg in sodium chloride 0.9 % 145 mL IVPB  150 mg Intravenous Once Magrinat, Virgie Dad, MD      . gemcitabine (GEMZAR) 1,520 mg in sodium chloride 0.9 % 250 mL chemo infusion  800 mg/m2 (Treatment Plan Recorded) Intravenous Once Magrinat, Virgie Dad, MD      . heparin lock flush 100 unit/mL  500 Units Intracatheter Once PRN Magrinat, Virgie Dad, MD      . sodium chloride flush (NS) 0.9 % injection 10 mL  10 mL Intracatheter PRN Magrinat, Virgie Dad, MD      . trastuzumab-dkst (OGIVRI) 450 mg in sodium chloride 0.9 % 250 mL chemo infusion  450 mg Intravenous Once Magrinat, Virgie Dad, MD        PHYSICAL EXAMINATION: ECOG PERFORMANCE STATUS: 1 - Symptomatic but completely ambulatory  Vitals:   12/15/19 1138  BP: 134/67  Pulse: 72  Resp: 18  Temp: 97.7 F (36.5 C)  SpO2: 99%   Filed Weights   12/15/19 1138  Weight: 174 lb 14.4 oz (79.3 kg)    GENERAL:alert, no distress and comfortable SKIN: No rash EYES: sclera clear LUNGS:  normal breathing effort NEURO: alert & oriented x 3 with fluent speech, no focal motor deficits PAC without erythema, mild ecchymosis Breast exam  deferred  LABORATORY DATA:  I have reviewed the data as listed CBC Latest Ref Rng & Units 12/15/2019 12/01/2019 11/24/2019  WBC 4.0 - 10.5 K/uL 16.7(H) 13.3(H) 12.2(H)  Hemoglobin 12.0 - 15.0 g/dL 13.6 12.1 12.7  Hematocrit 36 - 46 % 42.5 37.4 39.6  Platelets 150 - 400 K/uL 267 119(L) 219     CMP Latest Ref Rng & Units 12/15/2019 12/01/2019 11/24/2019  Glucose 70 - 99 mg/dL 207(H) 124(H) 232(H)  BUN 8 - 23 mg/dL 18 13 18   Creatinine 0.44 - 1.00 mg/dL 0.69 0.62 0.83  Sodium 135 - 145 mmol/L 140 141 138  Potassium 3.5 - 5.1 mmol/L 3.9 3.7 4.1  Chloride 98 - 111 mmol/L 106 106 103  CO2 22 - 32 mmol/L 24 25 22   Calcium 8.9 - 10.3 mg/dL 10.1 9.2 10.1  Total Protein 6.5 - 8.1 g/dL 7.2 6.8 6.5  Total Bilirubin 0.3 - 1.2 mg/dL 0.3 0.3 0.4  Alkaline Phos 38 - 126 U/L 81 70 42  AST 15 - 41 U/L 17 34 11(L)  ALT 0 - 44 U/L 27 48(H) 15      RADIOGRAPHIC STUDIES: I have personally reviewed the radiological images as listed and agreed with the findings in the report. No results found.   ASSESSMENT & PLAN: 69 year old postmenopausal female  1.  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  History of stage Ia endometrial carcinoma, FIGO grade 3 -S/p TAH/BSO in 2014 followed by vaginal brachytherapy -No adjuvant chemotherapy  Disposition: Ms. Diles appears stable.  She completed 1 cycle of adjuvant carboplatin/gemcitabine and trastuzumab.  She tolerated treatment well with mild fatigue, nausea, and diarrhea.  Side effects are well managed with supportive meds at home.  She is able to work.  She is overall able  to recover and  function well.  CBC and CMP adequate for treatment.  She will proceed with cycle 2 carboplatin/gemcitabine and trastuzumab as planned.  Injection on 12/3.  Follow-up in 3 weeks with cycle 3.  All questions were answered. The patient knows to call the clinic with any problems, questions or concerns. No barriers to learning were detected.     Alla Feeling, NP 12/15/19

## 2019-12-15 ENCOUNTER — Inpatient Hospital Stay: Payer: Medicare Other | Admitting: Nurse Practitioner

## 2019-12-15 ENCOUNTER — Inpatient Hospital Stay: Payer: Medicare Other

## 2019-12-15 ENCOUNTER — Encounter: Payer: Self-pay | Admitting: Nurse Practitioner

## 2019-12-15 ENCOUNTER — Other Ambulatory Visit: Payer: Self-pay

## 2019-12-15 ENCOUNTER — Encounter: Payer: Self-pay | Admitting: Licensed Clinical Social Worker

## 2019-12-15 ENCOUNTER — Ambulatory Visit: Payer: Medicare Other

## 2019-12-15 ENCOUNTER — Inpatient Hospital Stay: Payer: Medicare Other | Attending: Oncology

## 2019-12-15 VITALS — BP 134/67 | HR 72 | Temp 97.7°F | Resp 18 | Ht 64.0 in | Wt 174.9 lb

## 2019-12-15 DIAGNOSIS — Z801 Family history of malignant neoplasm of trachea, bronchus and lung: Secondary | ICD-10-CM | POA: Insufficient documentation

## 2019-12-15 DIAGNOSIS — Z833 Family history of diabetes mellitus: Secondary | ICD-10-CM | POA: Insufficient documentation

## 2019-12-15 DIAGNOSIS — Z803 Family history of malignant neoplasm of breast: Secondary | ICD-10-CM | POA: Diagnosis not present

## 2019-12-15 DIAGNOSIS — I2510 Atherosclerotic heart disease of native coronary artery without angina pectoris: Secondary | ICD-10-CM | POA: Insufficient documentation

## 2019-12-15 DIAGNOSIS — Z8249 Family history of ischemic heart disease and other diseases of the circulatory system: Secondary | ICD-10-CM | POA: Diagnosis not present

## 2019-12-15 DIAGNOSIS — Z79899 Other long term (current) drug therapy: Secondary | ICD-10-CM | POA: Diagnosis not present

## 2019-12-15 DIAGNOSIS — Z5111 Encounter for antineoplastic chemotherapy: Secondary | ICD-10-CM | POA: Insufficient documentation

## 2019-12-15 DIAGNOSIS — C50412 Malignant neoplasm of upper-outer quadrant of left female breast: Secondary | ICD-10-CM

## 2019-12-15 DIAGNOSIS — Z5189 Encounter for other specified aftercare: Secondary | ICD-10-CM | POA: Insufficient documentation

## 2019-12-15 DIAGNOSIS — Z5112 Encounter for antineoplastic immunotherapy: Secondary | ICD-10-CM | POA: Diagnosis not present

## 2019-12-15 DIAGNOSIS — Z8269 Family history of other diseases of the musculoskeletal system and connective tissue: Secondary | ICD-10-CM | POA: Insufficient documentation

## 2019-12-15 DIAGNOSIS — Z170 Estrogen receptor positive status [ER+]: Secondary | ICD-10-CM | POA: Diagnosis not present

## 2019-12-15 DIAGNOSIS — G620 Drug-induced polyneuropathy: Secondary | ICD-10-CM | POA: Insufficient documentation

## 2019-12-15 DIAGNOSIS — E785 Hyperlipidemia, unspecified: Secondary | ICD-10-CM | POA: Diagnosis not present

## 2019-12-15 DIAGNOSIS — E119 Type 2 diabetes mellitus without complications: Secondary | ICD-10-CM | POA: Diagnosis not present

## 2019-12-15 LAB — CBC WITH DIFFERENTIAL (CANCER CENTER ONLY)
Abs Immature Granulocytes: 0.05 10*3/uL (ref 0.00–0.07)
Basophils Absolute: 0.1 10*3/uL (ref 0.0–0.1)
Basophils Relative: 1 %
Eosinophils Absolute: 0 10*3/uL (ref 0.0–0.5)
Eosinophils Relative: 0 %
HCT: 42.5 % (ref 36.0–46.0)
Hemoglobin: 13.6 g/dL (ref 12.0–15.0)
Immature Granulocytes: 0 %
Lymphocytes Relative: 59 %
Lymphs Abs: 9.8 10*3/uL — ABNORMAL HIGH (ref 0.7–4.0)
MCH: 31.1 pg (ref 26.0–34.0)
MCHC: 32 g/dL (ref 30.0–36.0)
MCV: 97.3 fL (ref 80.0–100.0)
Monocytes Absolute: 1 10*3/uL (ref 0.1–1.0)
Monocytes Relative: 6 %
Neutro Abs: 5.8 10*3/uL (ref 1.7–7.7)
Neutrophils Relative %: 34 %
Platelet Count: 267 10*3/uL (ref 150–400)
RBC: 4.37 MIL/uL (ref 3.87–5.11)
RDW: 17.5 % — ABNORMAL HIGH (ref 11.5–15.5)
WBC Count: 16.7 10*3/uL — ABNORMAL HIGH (ref 4.0–10.5)
nRBC: 0 % (ref 0.0–0.2)

## 2019-12-15 LAB — CMP (CANCER CENTER ONLY)
ALT: 27 U/L (ref 0–44)
AST: 17 U/L (ref 15–41)
Albumin: 4 g/dL (ref 3.5–5.0)
Alkaline Phosphatase: 81 U/L (ref 38–126)
Anion gap: 10 (ref 5–15)
BUN: 18 mg/dL (ref 8–23)
CO2: 24 mmol/L (ref 22–32)
Calcium: 10.1 mg/dL (ref 8.9–10.3)
Chloride: 106 mmol/L (ref 98–111)
Creatinine: 0.69 mg/dL (ref 0.44–1.00)
GFR, Estimated: >60 mL/min (ref >60)
Glucose, Bld: 207 mg/dL — ABNORMAL HIGH (ref 70–99)
Potassium: 3.9 mmol/L (ref 3.5–5.1)
Sodium: 140 mmol/L (ref 135–145)
Total Bilirubin: 0.3 mg/dL (ref 0.3–1.2)
Total Protein: 7.2 g/dL (ref 6.5–8.1)

## 2019-12-15 MED ORDER — SODIUM CHLORIDE 0.9 % IV SOLN
150.0000 mg | Freq: Once | INTRAVENOUS | Status: AC
  Administered 2019-12-15: 150 mg via INTRAVENOUS
  Filled 2019-12-15: qty 150

## 2019-12-15 MED ORDER — SODIUM CHLORIDE 0.9% FLUSH
10.0000 mL | INTRAVENOUS | Status: DC | PRN
  Administered 2019-12-15: 10 mL
  Filled 2019-12-15: qty 10

## 2019-12-15 MED ORDER — DIPHENHYDRAMINE HCL 25 MG PO CAPS
25.0000 mg | ORAL_CAPSULE | Freq: Once | ORAL | Status: AC
  Administered 2019-12-15: 25 mg via ORAL

## 2019-12-15 MED ORDER — PALONOSETRON HCL INJECTION 0.25 MG/5ML
0.2500 mg | Freq: Once | INTRAVENOUS | Status: AC
  Administered 2019-12-15: 0.25 mg via INTRAVENOUS

## 2019-12-15 MED ORDER — DIPHENHYDRAMINE HCL 25 MG PO CAPS
ORAL_CAPSULE | ORAL | Status: AC
  Filled 2019-12-15: qty 1

## 2019-12-15 MED ORDER — SODIUM CHLORIDE 0.9 % IV SOLN
455.0000 mg | Freq: Once | INTRAVENOUS | Status: AC
  Administered 2019-12-15: 460 mg via INTRAVENOUS
  Filled 2019-12-15: qty 46

## 2019-12-15 MED ORDER — SODIUM CHLORIDE 0.9 % IV SOLN
800.0000 mg/m2 | Freq: Once | INTRAVENOUS | Status: AC
  Administered 2019-12-15: 1520 mg via INTRAVENOUS
  Filled 2019-12-15: qty 39.98

## 2019-12-15 MED ORDER — TRASTUZUMAB-DKST CHEMO 150 MG IV SOLR
450.0000 mg | Freq: Once | INTRAVENOUS | Status: AC
  Administered 2019-12-15: 450 mg via INTRAVENOUS
  Filled 2019-12-15: qty 21.43

## 2019-12-15 MED ORDER — ACETAMINOPHEN 325 MG PO TABS
650.0000 mg | ORAL_TABLET | Freq: Once | ORAL | Status: AC
  Administered 2019-12-15: 650 mg via ORAL

## 2019-12-15 MED ORDER — ACETAMINOPHEN 325 MG PO TABS
ORAL_TABLET | ORAL | Status: AC
  Filled 2019-12-15: qty 2

## 2019-12-15 MED ORDER — PROCHLORPERAZINE EDISYLATE 10 MG/2ML IJ SOLN
10.0000 mg | Freq: Once | INTRAMUSCULAR | Status: AC
  Administered 2019-12-15: 10 mg via INTRAVENOUS

## 2019-12-15 MED ORDER — SODIUM CHLORIDE 0.9 % IV SOLN
Freq: Once | INTRAVENOUS | Status: AC
  Filled 2019-12-15: qty 250

## 2019-12-15 MED ORDER — HEPARIN SOD (PORK) LOCK FLUSH 100 UNIT/ML IV SOLN
500.0000 [IU] | Freq: Once | INTRAVENOUS | Status: AC | PRN
  Administered 2019-12-15: 500 [IU]
  Filled 2019-12-15: qty 5

## 2019-12-15 MED ORDER — PROCHLORPERAZINE EDISYLATE 10 MG/2ML IJ SOLN
INTRAMUSCULAR | Status: AC
  Filled 2019-12-15: qty 2

## 2019-12-15 MED ORDER — PALONOSETRON HCL INJECTION 0.25 MG/5ML
INTRAVENOUS | Status: AC
  Filled 2019-12-15: qty 5

## 2019-12-15 NOTE — Progress Notes (Signed)
[facility] CSW Progress Note  Clinical Social Worker attempted to see patient in infusion for ongoing support. Patient sleeping. Left next [employer] with patient's belongings. Will plan to check-in during next infusion     Christeen Douglas , LCSW

## 2019-12-15 NOTE — Patient Instructions (Signed)
[facility] Discharge Instructions for Patients Receiving Chemotherapy  Today you received the following chemotherapy agents Trastuzumab-dkst (OGIVRI), Gemcitabine (GEMZAR) & Carboplatin (PARAPLATIN).  To help prevent nausea and vomiting after your treatment, we encourage you to take your nausea medication as prescribed.   If you develop nausea and vomiting that is not controlled by your nausea medication, call the clinic.   BELOW ARE SYMPTOMS THAT SHOULD BE REPORTED IMMEDIATELY:  *FEVER GREATER THAN 100.5 F  *CHILLS WITH OR WITHOUT FEVER  NAUSEA AND VOMITING THAT IS NOT CONTROLLED WITH YOUR NAUSEA MEDICATION  *UNUSUAL SHORTNESS OF BREATH  *UNUSUAL BRUISING OR BLEEDING  TENDERNESS IN MOUTH AND THROAT WITH OR WITHOUT PRESENCE OF ULCERS  *URINARY PROBLEMS  *BOWEL PROBLEMS  UNUSUAL RASH Items with * indicate a potential emergency and should be followed up as soon as possible.  Feel free to call the clinic should you have any questions or concerns. The clinic phone number is [phone].  Please show the [facility] at check-in to the Emergency Department and triage nurse.

## 2019-12-16 ENCOUNTER — Ambulatory Visit: Payer: Medicare Other

## 2019-12-17 ENCOUNTER — Inpatient Hospital Stay: Payer: Medicare Other

## 2019-12-17 ENCOUNTER — Ambulatory Visit: Payer: Medicare Other

## 2019-12-17 ENCOUNTER — Other Ambulatory Visit: Payer: Self-pay

## 2019-12-17 ENCOUNTER — Encounter: Payer: Self-pay | Admitting: *Deleted

## 2019-12-17 VITALS — BP 128/72 | HR 71 | Temp 98.2°F | Resp 18

## 2019-12-17 DIAGNOSIS — Z5112 Encounter for antineoplastic immunotherapy: Secondary | ICD-10-CM | POA: Diagnosis not present

## 2019-12-17 DIAGNOSIS — C50412 Malignant neoplasm of upper-outer quadrant of left female breast: Secondary | ICD-10-CM

## 2019-12-17 MED ORDER — PEGFILGRASTIM-CBQV 6 MG/0.6ML ~~LOC~~ SOSY
PREFILLED_SYRINGE | SUBCUTANEOUS | Status: AC
  Filled 2019-12-17: qty 0.6

## 2019-12-17 MED ORDER — PEGFILGRASTIM-CBQV 6 MG/0.6ML ~~LOC~~ SOSY
6.0000 mg | PREFILLED_SYRINGE | Freq: Once | SUBCUTANEOUS | Status: AC
  Administered 2019-12-17: 6 mg via SUBCUTANEOUS

## 2019-12-17 NOTE — Progress Notes (Signed)
Patient dropped off on 12/15/19 FMLA forms for her son, Arnaldo Natal. Forwarded to [place], Therapist, sports.

## 2019-12-17 NOTE — Patient Instructions (Signed)

## 2019-12-20 ENCOUNTER — Ambulatory Visit: Payer: Medicare Other

## 2019-12-21 ENCOUNTER — Ambulatory Visit: Payer: Medicare Other

## 2019-12-22 ENCOUNTER — Ambulatory Visit: Payer: Medicare Other

## 2019-12-23 ENCOUNTER — Ambulatory Visit: Payer: Medicare Other

## 2020-01-03 NOTE — Progress Notes (Signed)
Felicia [name]  [phone] [phone]     ID: Felicia [name] DOB: January 02, 1951  MR#: [id number]  UYQ#:[id number]  Patient Care Team: Kathyrn Lass, MD as PCP - General (Family Medicine) Nahser, [name], MD as PCP - Cardiology (Cardiology) Mauro Kaufmann, RN as Oncology Nurse Navigator Rockwell Germany, RN as Oncology Nurse Navigator Rolm Bookbinder, MD as Consulting Physician (General Surgery) Hyrum Shaneyfelt, Virgie Dad, MD as Consulting Physician (Oncology) Eppie Gibson, MD as Attending Physician (Radiation Oncology) [name], MD as Consulting Physician (Rheumatology) [name], MD as Consulting Physician (Orthopedic Surgery) Janyth Pupa, DO as Consulting Physician (Obstetrics and Gynecology) Larey Dresser, MD as Consulting Physician (Cardiology) Chauncey Cruel, MD OTHER MD:  CHIEF COMPLAINT: Triple positive invasive breast cancer  CURRENT TREATMENT: Trastuzumab, gemcitabine, carboplatin   INTERVAL HISTORY: [name] returns today for follow up and treatment of her triple positive invasive breast cancer.    She began adjuvant chemotherapy, consisting of carboplatin, gemcitabine, trastuzumab, on 11/24/2019. Today is day 1 cycle 3.   To review, her treatment has been tailored to her background.  We were not able to give her docetaxel because of neuropathy and diabetes issues.    Her most recent echocardiogram from 09/15/2019 showed an ejection fraction of 60-65%.   REVIEW OF SYSTEMS: Shundra has not begun to lose her hair but she tells me she thinks it can happen after this cycle.  She is not planning on using wigs but but [place] hats.  She continues to work part-time, currently Saturday Sunday Tuesday and Thursday.  She had very minimal nausea on day 3 of her last cycle.  She took 1 Compazine and that took care of it.  She never did vomit.  She is planning to see visit her daughter in [place] for the Christmas holiday.  Detailed  review of systems today was otherwise stable   COVID 19 VACCINATION STATUS: Refuses vaccination   HISTORY OF CURRENT ILLNESS: From the original intake note:  Felicia [name] had routine screening mammography on 08/03/2019 showing a possible abnormality in the left breast. She underwent left diagnostic mammography with tomography and left breast ultrasonography at [facility] on 08/17/2019 showing: breast density category B; 1.6 cm upper-outer left breast mass; no significant left axillary abnormalities.  Accordingly on 08/30/2019 she proceeded to biopsy of the left breast area in question. The pathology from this procedure ([id number]) showed: invasive mammary carcinoma, e-cadherin positive, grade 2. Prognostic indicators significant for: estrogen receptor, 100% positive and progesterone receptor, 40% positive, both with strong staining intensity. Proliferation marker Ki67 at 20%. HER2 equivocal by immunohistochemistry (2+), but negative by fluorescent in situ hybridization with a signals ratio   and number per cell  .  Of note, she also has a history of FIGO grade 3, stage IA endometrial carcinoma, diagnosed in 2014 in [place]. She underwent robotic assisted total hysterectomy with BSO and pelvic and perirectal aortic lymph node dissection as well as omental biopsy.  Tumor was found only in the endometrium arising in a polyp measuring 1.7 cm.  There was no myometrial invasion no lymphovascular space invasion, and no lower uterine segment involvement.  The cervix and adnexa, lymph nodes and omentum were all not involved.  She then received vaginal brachytherapy, no chemotherapy, completing treatment in the springtime of 2015. [Per patient's report, chemotherapy was suggested, but she opted against this.]  The patient's subsequent history is as detailed below.   PAST MEDICAL HISTORY: Past Medical History:  Diagnosis Date  . Anemia   .  Anxiety   . Breast cancer ([place]) 09/2019   left breast [facility]  .  CAD (coronary artery disease)    a. 3/2007s/p DES to the LAD [facility]); b. 01/2017 MV: EF 80%, small, mild apical ant defect w/o ischemia (felt to be breast atten). Low risk.  . Depression   . DM (diabetes mellitus) ([place])   . Essential hypertension   . GERD (gastroesophageal reflux disease)   . Headache    secondary to neck surgery per patient  . High triglycerides   . Hyperlipidemia   . Hypothyroid   . OA (osteoarthritis)    right knee,hands  . Overflow incontinence   . PONV (postoperative nausea and vomiting)    s/p gallbladder   . RLS (restless legs syndrome)   . Urinary urgency   . Uterine cancer ([place]) dx'd 2014    PAST SURGICAL HISTORY: Past Surgical History:  Procedure Laterality Date  . BREAST LUMPECTOMY WITH RADIOACTIVE SEED AND SENTINEL LYMPH NODE BIOPSY Left 10/07/2019   Procedure: LEFT BREAST LUMPECTOMY WITH RADIOACTIVE SEED AND SENTINEL LYMPH NODE BIOPSY;  Surgeon: Rolm Bookbinder, MD;  Location: [place];  Service: General;  Laterality: Left;  . CORONARY STENT PLACEMENT    . CORONARY/GRAFT ACUTE MI REVASCULARIZATION    . FINGER ARTHRODESIS Right 06/11/2019   Procedure: ARTHRODESIS INDEX FINGER DISTAL PHALANGEAL JOINT;  Surgeon: [name], MD;  Location: [place];  Service: Orthopedics;  Laterality: Right;  . FOOT SURGERY Left   . LEFT HEART CATH AND CORONARY ANGIOGRAPHY N/A 02/16/2018   Procedure: LEFT HEART CATH AND CORONARY ANGIOGRAPHY;  Surgeon: Lorretta Harp, MD;  Location: [place] CV LAB;  Service: Cardiovascular;  Laterality: N/A;  . PORTACATH PLACEMENT Right 11/23/2019   Procedure: INSERTION PORT-A-CATH WITH ULTRASOUND GUIDANCE;  Surgeon: Rolm Bookbinder, MD;  Location: [place];  Service: General;  Laterality: Right;  . REPLACEMENT TOTAL KNEE Right   . SPINE SURGERY    . TOTAL ABDOMINAL HYSTERECTOMY     FOR UTERUS CANCER  . TOTAL HIP ARTHROPLASTY Left     FAMILY HISTORY: Family History   Problem Relation Age of Onset  . AAA (abdominal aortic aneurysm) Mother   . Diabetes Father   . Alzheimer's disease Father   . Throat cancer Sister   . Breast cancer Cousin    Her father died at age 49 from Alzheimer's. Her mother died at age 60 from a ruptured abdominal aneurysm. Shamara has 2 brothers and 3 sisters. She reports breast cancer in a paternal cousin. She also notes throat cancer in her sister.   GYNECOLOGIC HISTORY:  No LMP recorded. Patient has had a hysterectomy. Menarche: 69 years old Age at first live birth: 69 years old [place] P 2 LMP 1996 Contraceptive: prior use without issue HRT never used  Hysterectomy? Yes, 2014 BSO? 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  She unpacks 40 pounds boxes of chicken, has to cook them in 13 pound cooking pans, and is generally on her feet at work but does not otherwise exercise.  She is divorced. She lives at home by herself, with no pets. Daughter Pershing Proud, age 42, is a radiology tech in [place]. Daughter Martyn Malay, age 24, is an Optometrist in [place], [place]. Dajanae has one grandchild. She is not a Ambulance person.    ADVANCED DIRECTIVES: Not in place; intends to name daughter [name] as her HCPOA.   HEALTH MAINTENANCE: Social  History   Tobacco Use  . Smoking status: Never Smoker  . Smokeless tobacco: Never Used  Vaping Use  . Vaping Use: Never used  Substance Use Topics  . Alcohol use: Yes    Comment: OCCASIONALLY  . Drug use: No     Colonoscopy: <10 years ago, in [place]  PAP: 2014?  Bone density: date unsure, also in [place]   Allergies  Allergen Reactions  . Chloraprep One Step [Chlorhexidine Gluconate] Rash  . Estrogens Other (See Comments)    PATIENT HAS A HISTORY OF CANCER AND HAS BEEN TOLD TO NEVER TAKE ANYTHING CONTAINING ESTROGEN, AS IT MIGHT CAUSE A RECURRENCE  . Shrimp [Shellfish Allergy] Nausea And Vomiting    Current Outpatient Medications   Medication Sig Dispense Refill  . ACCU-CHEK GUIDE test strip 1 each by Other route as needed.    . Ascorbic Acid (VITAMIN C PO) Take 1 tablet by mouth daily.    Marland Kitchen aspirin EC 81 MG tablet Take 81 mg by mouth daily.    Marland Kitchen atorvastatin (LIPITOR) 40 MG tablet TAKE 1 TABLET BY MOUTH EVERY DAY 90 tablet 1  . bisoprolol (ZEBETA) 5 MG tablet TAKE 1 TABLET BY MOUTH EVERY DAY 90 tablet 2  . Calcium Citrate-Vitamin D (CALCIUM + D PO) Take 1 tablet by mouth daily with supper.    Marland Kitchen CINNAMON PO Take 1 capsule by mouth daily with supper.    . diclofenac sodium (VOLTAREN) 1 % GEL Apply 2-4 g topically 4 (four) times daily as needed (as directed for pain).    Marland Kitchen diclofenac Sodium (VOLTAREN) 1 % GEL Apply 1 application topically 3 (three) times daily as needed.    . Dulaglutide (TRULICITY) 1.5 HK/7.4QV SOPN Inject 1.5 mg into the skin once a week.    . escitalopram (LEXAPRO) 10 MG tablet Take 10 mg by mouth daily.    . Ferrous Sulfate (IRON PO) Take 1 tablet by mouth daily. alternates days:1 tablet one day and 2 tablets the next day    . ferrous sulfate 325 (65 FE) MG EC tablet Take 325 mg by mouth 3 (three) times daily with meals.    Marland Kitchen GLIPIZIDE XL 10 MG 24 hr tablet Take 20 mg by mouth daily.   3  . JARDIANCE 25 MG TABS tablet Take 25 mg by mouth every morning.   3  . levothyroxine (SYNTHROID, LEVOTHROID) 125 MCG tablet Take 125 mcg by mouth daily before breakfast.    . lidocaine-prilocaine (EMLA) cream Apply to affected area once 30 g 3  . metroNIDAZOLE (METROGEL) 1 % gel Apply 1 application topically as needed (as directed to affected area).     . Multiple Vitamins-Calcium (ONE-A-DAY WOMENS PO) Take 1 tablet by mouth daily.    . nitroGLYCERIN (NITROSTAT) 0.4 MG SL tablet Place 1 tablet (0.4 mg total) under the tongue every 5 (five) minutes as needed for chest pain. 25 tablet 6  . omeprazole (PRILOSEC) 40 MG capsule Take 40 mg by mouth daily with supper.     . ondansetron (ZOFRAN) 8 MG tablet Take 1 tablet (8  mg total) by mouth 2 (two) times daily as needed for refractory nausea / vomiting. Start on day 3 after chemo. 30 tablet 1  . prochlorperazine (COMPAZINE) 10 MG tablet Take 1 tablet by mouth before meals and at bedtime on days 2 and 3 after chemotherapy (counting chemotherapy day as day 1); after that may take as needed 30 tablet 1  . rOPINIRole (REQUIP) 0.5 MG tablet Take  0.5 mg by mouth at bedtime. 1-3 hours prior to bedtime    . tiZANidine (ZANAFLEX) 4 MG tablet Take 4 mg by mouth 3 (three) times daily. As needed    . TURMERIC PO Take 1 capsule by mouth daily with supper.     No current facility-administered medications for this visit.    OBJECTIVE: White woman who appears stated age  33:   01/04/20 0920  BP: 117/72  Pulse: 72  Resp: 17  Temp: 97.7 F (36.5 C)  SpO2: 98%     Body mass index is 30.09 kg/m.   Wt Readings from Last 3 Encounters:  01/04/20 175 lb 4.8 oz (79.5 kg)  12/15/19 174 lb 14.4 oz (79.3 kg)  12/01/19 176 lb 12.8 oz (80.2 kg)      ECOG FS:1 - Symptomatic but completely ambulatory  Sclerae unicteric, EOMs intact Wearing a mask No cervical or supraclavicular adenopathy Lungs no rales or rhonchi Heart regular rate and rhythm Abd soft, nontender, positive bowel sounds MSK no focal spinal tenderness, no upper extremity lymphedema Neuro: nonfocal, well oriented, appropriate affect Breasts: Deferred   LAB RESULTS:  CMP     Component Value Date/Time   NA 140 12/15/2019 1128   NA 139 11/25/2018 1053   K 3.9 12/15/2019 1128   CL 106 12/15/2019 1128   CO2 24 12/15/2019 1128   GLUCOSE 207 (H) 12/15/2019 1128   BUN 18 12/15/2019 1128   BUN 18 11/25/2018 1053   CREATININE 0.69 12/15/2019 1128   CALCIUM 10.1 12/15/2019 1128   PROT 7.2 12/15/2019 1128   PROT 7.1 11/25/2018 1053   ALBUMIN 4.0 12/15/2019 1128   ALBUMIN 4.2 11/25/2018 1053   AST 17 12/15/2019 1128   ALT 27 12/15/2019 1128   ALKPHOS 81 12/15/2019 1128   BILITOT 0.3 12/15/2019 1128    GFRNONAA >60 12/15/2019 1128   GFRAA >60 10/04/2019 1330   GFRAA >60 09/08/2019 0811    No results found for: TOTALPROTELP, ALBUMINELP, A1GS, A2GS, BETS, BETA2SER, GAMS, MSPIKE, SPEI  Lab Results  Component Value Date   WBC 20.2 (H) 01/04/2020   NEUTROABS PENDING 01/04/2020   HGB 12.2 01/04/2020   HCT 38.5 01/04/2020   MCV 98.5 01/04/2020   PLT 258 01/04/2020    No results found for: LABCA2  No components found for: EPPIRJ188  No results for input(s): INR in the last 168 hours.  No results found for: LABCA2  No results found for: CZY606  No results found for: TKZ601  No results found for: UXN235  No results found for: CA2729  No components found for: HGQUANT  No results found for: CEA1 / No results found for: CEA1   No results found for: AFPTUMOR  No results found for: CHROMOGRNA  No results found for: KPAFRELGTCHN, LAMBDASER, KAPLAMBRATIO (kappa/lambda light chains)  No results found for: HGBA, HGBA2QUANT, HGBFQUANT, HGBSQUAN (Hemoglobinopathy evaluation)   No results found for: LDH  No results found for: IRON, TIBC, IRONPCTSAT (Iron and TIBC)  No results found for: FERRITIN  Urinalysis No results found for: COLORURINE, APPEARANCEUR, LABSPEC, PHURINE, GLUCOSEU, HGBUR, BILIRUBINUR, KETONESUR, PROTEINUR, UROBILINOGEN, NITRITE, LEUKOCYTESUR   STUDIES: No results found.   ELIGIBLE FOR AVAILABLE RESEARCH PROTOCOL: AET  ASSESSMENT: 69 y.o. [place] woman status post left breast upper outer quadrant biopsy 08/30/2019 for a clinical T1c N0, stage IA invasive ductal carcinoma, grade 2, estrogen and progesterone receptor positive, HER-2 amplified, with an MIB-1 of 20%.  (0) status post TAH/BSO 2014 for a FIGO grade 3, stage  IA endometrial carcinoma, status post vaginal brachytherapy, no adjuvant chemotherapy  (1) status post left lumpectomy and axillary sentinel node sampling 10/07/2019 for a pT1c pN1, stage IB invasive ductal carcinoma, grade 2, with  negative margins.  (a) a total of 1 sentinel lymph node was removed.  (2) adjuvant chemotherapy to consist of carboplatin, gemcitabine, trastuzumab  (a) baseline echo 09/15/2019 shows an ejection fraction in the 60-65% range.  (b) will not use docetaxel secondary to severe peripheral neuropathy present at baseline  (3) trastuzumab to be continued to complete a year  (4) adjuvant radiation to follow  (5) antiestrogens to start at the completion of local treatment   PLAN: Bert will proceed to cycle #3 of her chemotherapy today.  She is tolerating this remarkably well.  We reviewed her next 3 cycles which are being scheduled and she will have visits on those days as well.  She of course knows that she needs to come back on day 3 for her "shot".  She is very concerned about radiation.  She had radiation previously of course but it was "internal" for endometrial cancer.  She just does not know how her skin will fair and coming here every day may be difficult for her.  Aside from this am delighted that she is doing as well as she is.  The plan is to proceed to 3 more cycles and then continue trastuzumab to complete 1 year  Total encounter time 25 minutes.Sarajane Jews C. Florence Yeung, MD 01/04/2020 9:43 AM Medical Oncology and Hematology [facility] [place], [place] [address] Tel. [phone]    Fax. [phone]   This document serves as a record of services personally performed by Lurline Del, MD. It was created on his behalf by Wilburn Mylar, a trained medical scribe. The creation of this record is based on the scribe's personal observations and the provider's statements to them.   I, Lurline Del MD, have reviewed the above documentation for accuracy and completeness, and I agree with the above.   *Total Encounter Time as defined by the Centers for Medicare and Medicaid Services includes, in addition to the face-to-face time of a patient visit  (documented in the note above) non-face-to-face time: obtaining and reviewing outside history, ordering and reviewing medications, tests or procedures, care coordination (communications with other health care professionals or caregivers) and documentation in the medical record.

## 2020-01-04 ENCOUNTER — Other Ambulatory Visit: Payer: Self-pay | Admitting: *Deleted

## 2020-01-04 ENCOUNTER — Telehealth: Payer: Self-pay | Admitting: Oncology

## 2020-01-04 ENCOUNTER — Inpatient Hospital Stay: Payer: Medicare Other

## 2020-01-04 ENCOUNTER — Inpatient Hospital Stay: Payer: Medicare Other | Admitting: Oncology

## 2020-01-04 ENCOUNTER — Other Ambulatory Visit: Payer: Self-pay

## 2020-01-04 ENCOUNTER — Encounter: Payer: Self-pay | Admitting: *Deleted

## 2020-01-04 ENCOUNTER — Encounter: Payer: Self-pay | Admitting: Licensed Clinical Social Worker

## 2020-01-04 VITALS — BP 117/72 | HR 72 | Temp 97.7°F | Resp 17 | Ht 64.0 in | Wt 175.3 lb

## 2020-01-04 DIAGNOSIS — C50412 Malignant neoplasm of upper-outer quadrant of left female breast: Secondary | ICD-10-CM

## 2020-01-04 DIAGNOSIS — Z170 Estrogen receptor positive status [ER+]: Secondary | ICD-10-CM

## 2020-01-04 DIAGNOSIS — Z95828 Presence of other vascular implants and grafts: Secondary | ICD-10-CM

## 2020-01-04 DIAGNOSIS — Z5112 Encounter for antineoplastic immunotherapy: Secondary | ICD-10-CM | POA: Diagnosis not present

## 2020-01-04 LAB — CBC WITH DIFFERENTIAL (CANCER CENTER ONLY)
Abs Immature Granulocytes: 0.06 10*3/uL (ref 0.00–0.07)
Basophils Absolute: 0.1 10*3/uL (ref 0.0–0.1)
Basophils Relative: 0 %
Eosinophils Absolute: 0.1 10*3/uL (ref 0.0–0.5)
Eosinophils Relative: 1 %
HCT: 38.5 % (ref 36.0–46.0)
Hemoglobin: 12.2 g/dL (ref 12.0–15.0)
Immature Granulocytes: 0 %
Lymphocytes Relative: 56 %
Lymphs Abs: 11.4 10*3/uL — ABNORMAL HIGH (ref 0.7–4.0)
MCH: 31.2 pg (ref 26.0–34.0)
MCHC: 31.7 g/dL (ref 30.0–36.0)
MCV: 98.5 fL (ref 80.0–100.0)
Monocytes Absolute: 0.9 10*3/uL (ref 0.1–1.0)
Monocytes Relative: 5 %
Neutro Abs: 7.6 10*3/uL (ref 1.7–7.7)
Neutrophils Relative %: 38 %
Platelet Count: 258 10*3/uL (ref 150–400)
RBC: 3.91 MIL/uL (ref 3.87–5.11)
RDW: 18.3 % — ABNORMAL HIGH (ref 11.5–15.5)
WBC Count: 20.2 10*3/uL — ABNORMAL HIGH (ref 4.0–10.5)
nRBC: 0 % (ref 0.0–0.2)

## 2020-01-04 LAB — CMP (CANCER CENTER ONLY)
ALT: 23 U/L (ref 0–44)
AST: 16 U/L (ref 15–41)
Albumin: 3.7 g/dL (ref 3.5–5.0)
Alkaline Phosphatase: 93 U/L (ref 38–126)
Anion gap: 10 (ref 5–15)
BUN: 15 mg/dL (ref 8–23)
CO2: 23 mmol/L (ref 22–32)
Calcium: 9.6 mg/dL (ref 8.9–10.3)
Chloride: 107 mmol/L (ref 98–111)
Creatinine: 0.71 mg/dL (ref 0.44–1.00)
GFR, Estimated: >60 mL/min (ref >60)
Glucose, Bld: 229 mg/dL — ABNORMAL HIGH (ref 70–99)
Potassium: 3.8 mmol/L (ref 3.5–5.1)
Sodium: 140 mmol/L (ref 135–145)
Total Bilirubin: 0.3 mg/dL (ref 0.3–1.2)
Total Protein: 6.7 g/dL (ref 6.5–8.1)

## 2020-01-04 MED ORDER — DIPHENHYDRAMINE HCL 25 MG PO CAPS
ORAL_CAPSULE | ORAL | Status: AC
  Filled 2020-01-04: qty 1

## 2020-01-04 MED ORDER — PALONOSETRON HCL INJECTION 0.25 MG/5ML
0.2500 mg | Freq: Once | INTRAVENOUS | Status: AC
  Administered 2020-01-04: 0.25 mg via INTRAVENOUS

## 2020-01-04 MED ORDER — TRASTUZUMAB-DKST CHEMO 150 MG IV SOLR
450.0000 mg | Freq: Once | INTRAVENOUS | Status: AC
  Administered 2020-01-04: 450 mg via INTRAVENOUS
  Filled 2020-01-04: qty 21.43

## 2020-01-04 MED ORDER — TRASTUZUMAB-DKST CHEMO 150 MG IV SOLR: 6.0000 mg/kg | Freq: Once | INTRAVENOUS | Status: DC

## 2020-01-04 MED ORDER — HEPARIN SOD (PORK) LOCK FLUSH 100 UNIT/ML IV SOLN
500.0000 [IU] | Freq: Once | INTRAVENOUS | Status: AC | PRN
  Administered 2020-01-04: 500 [IU]
  Filled 2020-01-04: qty 5

## 2020-01-04 MED ORDER — PROCHLORPERAZINE EDISYLATE 10 MG/2ML IJ SOLN
INTRAMUSCULAR | Status: AC
  Filled 2020-01-04: qty 2

## 2020-01-04 MED ORDER — ACETAMINOPHEN 325 MG PO TABS
650.0000 mg | ORAL_TABLET | Freq: Once | ORAL | Status: AC
  Administered 2020-01-04: 650 mg via ORAL

## 2020-01-04 MED ORDER — DIPHENHYDRAMINE HCL 25 MG PO CAPS
25.0000 mg | ORAL_CAPSULE | Freq: Once | ORAL | Status: AC
  Administered 2020-01-04: 25 mg via ORAL

## 2020-01-04 MED ORDER — PROCHLORPERAZINE EDISYLATE 10 MG/2ML IJ SOLN
10.0000 mg | Freq: Once | INTRAMUSCULAR | Status: AC
  Administered 2020-01-04: 10 mg via INTRAVENOUS

## 2020-01-04 MED ORDER — ACETAMINOPHEN 325 MG PO TABS
ORAL_TABLET | ORAL | Status: AC
  Filled 2020-01-04: qty 2

## 2020-01-04 MED ORDER — SODIUM CHLORIDE 0.9 % IV SOLN
800.0000 mg/m2 | Freq: Once | INTRAVENOUS | Status: AC
  Administered 2020-01-04: 1520 mg via INTRAVENOUS
  Filled 2020-01-04: qty 39.98

## 2020-01-04 MED ORDER — PALONOSETRON HCL INJECTION 0.25 MG/5ML
INTRAVENOUS | Status: AC
  Filled 2020-01-04: qty 5

## 2020-01-04 MED ORDER — SODIUM CHLORIDE 0.9% FLUSH
10.0000 mL | Freq: Once | INTRAVENOUS | Status: AC
  Administered 2020-01-04: 10 mL
  Filled 2020-01-04: qty 10

## 2020-01-04 MED ORDER — SODIUM CHLORIDE 0.9 % IV SOLN
Freq: Once | INTRAVENOUS | Status: AC
  Filled 2020-01-04: qty 250

## 2020-01-04 MED ORDER — SODIUM CHLORIDE 0.9 % IV SOLN
150.0000 mg | Freq: Once | INTRAVENOUS | Status: AC
  Administered 2020-01-04: 150 mg via INTRAVENOUS
  Filled 2020-01-04: qty 150

## 2020-01-04 MED ORDER — SODIUM CHLORIDE 0.9 % IV SOLN
450.0000 mg | Freq: Once | INTRAVENOUS | Status: AC
  Administered 2020-01-04: 450 mg via INTRAVENOUS
  Filled 2020-01-04: qty 45

## 2020-01-04 MED ORDER — TRASTUZUMAB-DKST CHEMO 150 MG IV SOLR
6.0000 mg/kg | Freq: Once | INTRAVENOUS | Status: DC
Start: 2020-01-04 — End: 2020-01-04

## 2020-01-04 MED ORDER — SODIUM CHLORIDE 0.9% FLUSH
10.0000 mL | INTRAVENOUS | Status: DC | PRN
Start: 2020-01-04 — End: 2020-01-04
  Administered 2020-01-04: 10 mL
  Filled 2020-01-04: qty 10

## 2020-01-04 NOTE — Progress Notes (Signed)
Betadine used for port access!

## 2020-01-04 NOTE — Patient Instructions (Signed)
[facility] Discharge Instructions for Patients Receiving Chemotherapy  Today you received the following chemotherapy agents trastuzumab, gemcitabine, carboplatin.   To help prevent nausea and vomiting after your treatment, we encourage you to take your nausea medication as directed.  If you develop nausea and vomiting that is not controlled by your nausea medication, call the clinic.   BELOW ARE SYMPTOMS THAT SHOULD BE REPORTED IMMEDIATELY:  *FEVER GREATER THAN 100.5 F  *CHILLS WITH OR WITHOUT FEVER  NAUSEA AND VOMITING THAT IS NOT CONTROLLED WITH YOUR NAUSEA MEDICATION  *UNUSUAL SHORTNESS OF BREATH  *UNUSUAL BRUISING OR BLEEDING  TENDERNESS IN MOUTH AND THROAT WITH OR WITHOUT PRESENCE OF ULCERS  *URINARY PROBLEMS  *BOWEL PROBLEMS  UNUSUAL RASH Items with * indicate a potential emergency and should be followed up as soon as possible.  Feel free to call the clinic should you have any questions or concerns. The clinic phone number is [phone].  Please show the CHEMO ALERT CARD at check-in to the Emergency Department and triage nurse.   

## 2020-01-04 NOTE — Telephone Encounter (Signed)
Scheduled appts per 12/21 los. Gave pt a print out of AVS.

## 2020-01-04 NOTE — Progress Notes (Signed)
Per Dr. Jana Hakim, [facility] to treat with ECHO over 3 months.

## 2020-01-04 NOTE — Progress Notes (Signed)
[facility] CSW Progress Note  Clinical Social Worker attempted to see patient in infusion for ongoing support. Patient sleeping. Left last [employer] with patient's belongings. Will continue to check-in periodically through treatment.    Christeen Douglas , LCSW

## 2020-01-05 ENCOUNTER — Ambulatory Visit: Payer: Medicare Other | Admitting: Oncology

## 2020-01-05 ENCOUNTER — Ambulatory Visit: Payer: Medicare Other

## 2020-01-05 ENCOUNTER — Other Ambulatory Visit: Payer: Medicare Other

## 2020-01-06 ENCOUNTER — Inpatient Hospital Stay: Payer: Medicare Other

## 2020-01-06 ENCOUNTER — Other Ambulatory Visit: Payer: Self-pay

## 2020-01-06 VITALS — BP 115/69 | HR 78 | Temp 98.9°F | Resp 18

## 2020-01-06 DIAGNOSIS — Z170 Estrogen receptor positive status [ER+]: Secondary | ICD-10-CM

## 2020-01-06 DIAGNOSIS — C50412 Malignant neoplasm of upper-outer quadrant of left female breast: Secondary | ICD-10-CM

## 2020-01-06 DIAGNOSIS — Z5112 Encounter for antineoplastic immunotherapy: Secondary | ICD-10-CM | POA: Diagnosis not present

## 2020-01-06 MED ORDER — PEGFILGRASTIM-CBQV 6 MG/0.6ML ~~LOC~~ SOSY
6.0000 mg | PREFILLED_SYRINGE | Freq: Once | SUBCUTANEOUS | Status: AC
  Administered 2020-01-06: 6 mg via SUBCUTANEOUS

## 2020-01-06 MED ORDER — PEGFILGRASTIM-CBQV 6 MG/0.6ML ~~LOC~~ SOSY
PREFILLED_SYRINGE | SUBCUTANEOUS | Status: AC
  Filled 2020-01-06: qty 0.6

## 2020-01-06 NOTE — Patient Instructions (Signed)

## 2020-01-10 ENCOUNTER — Other Ambulatory Visit: Payer: Self-pay

## 2020-01-10 ENCOUNTER — Encounter: Payer: Self-pay | Admitting: *Deleted

## 2020-01-10 ENCOUNTER — Ambulatory Visit: Payer: Medicare Other | Attending: General Surgery | Admitting: Rehabilitation

## 2020-01-10 ENCOUNTER — Encounter: Payer: Self-pay | Admitting: Rehabilitation

## 2020-01-10 DIAGNOSIS — C50412 Malignant neoplasm of upper-outer quadrant of left female breast: Secondary | ICD-10-CM

## 2020-01-10 DIAGNOSIS — Z483 Aftercare following surgery for neoplasm: Secondary | ICD-10-CM | POA: Insufficient documentation

## 2020-01-10 DIAGNOSIS — Z170 Estrogen receptor positive status [ER+]: Secondary | ICD-10-CM

## 2020-01-10 NOTE — Therapy (Signed)
[place], [place], [address] Phone: [phone]   Fax:  [phone]  Physical Therapy Treatment  Patient Details  Name: Felicia [name] MRN: [id number] Date of Birth: 22-Jun-1950 Referring Provider (PT): Dr. Rolm Bookbinder   Encounter Date: 01/10/2020   PT End of Session - 01/10/20 1530    Visit Number 2   screen   PT Start Time 0932    PT Stop Time 1530    PT Time Calculation (min) 7 min    Activity Tolerance Patient tolerated treatment well    Behavior During Therapy [facility] for tasks assessed/performed           Past Medical History:  Diagnosis Date   Anemia    Anxiety    Breast cancer ([place]) 09/2019   left breast [facility]   CAD (coronary artery disease)    a. 3/2007s/p DES to the LAD [facility]); b. 01/2017 MV: EF 80%, small, mild apical ant defect w/o ischemia (felt to be breast atten). Low risk.   Depression    DM (diabetes mellitus) ([place])    Essential hypertension    GERD (gastroesophageal reflux disease)    Headache    secondary to neck surgery per patient   High triglycerides    Hyperlipidemia    Hypothyroid    OA (osteoarthritis)    right knee,hands   Overflow incontinence    PONV (postoperative nausea and vomiting)    s/p gallbladder    RLS (restless legs syndrome)    Urinary urgency    Uterine cancer ([place]) dx'd 2014    Past Surgical History:  Procedure Laterality Date   BREAST LUMPECTOMY WITH RADIOACTIVE SEED AND SENTINEL LYMPH NODE BIOPSY Left 10/07/2019   Procedure: LEFT BREAST LUMPECTOMY WITH RADIOACTIVE SEED AND SENTINEL LYMPH NODE BIOPSY;  Surgeon: Rolm Bookbinder, MD;  Location: [place];  Service: General;  Laterality: Left;   CORONARY STENT PLACEMENT     CORONARY/GRAFT ACUTE MI REVASCULARIZATION     FINGER ARTHRODESIS Right 06/11/2019   Procedure: ARTHRODESIS INDEX FINGER DISTAL PHALANGEAL JOINT;  Surgeon: [name], MD;   Location: [place];  Service: Orthopedics;  Laterality: Right;   FOOT SURGERY Left    LEFT HEART CATH AND CORONARY ANGIOGRAPHY N/A 02/16/2018   Procedure: LEFT HEART CATH AND CORONARY ANGIOGRAPHY;  Surgeon: Lorretta Harp, MD;  Location: [place] CV LAB;  Service: Cardiovascular;  Laterality: N/A;   PORTACATH PLACEMENT Right 11/23/2019   Procedure: INSERTION PORT-A-CATH WITH ULTRASOUND GUIDANCE;  Surgeon: Rolm Bookbinder, MD;  Location: [place];  Service: General;  Laterality: Right;   REPLACEMENT TOTAL KNEE Right    SPINE SURGERY     TOTAL ABDOMINAL HYSTERECTOMY     FOR UTERUS CANCER   TOTAL HIP ARTHROPLASTY Left     There were no vitals filed for this visit.   Subjective Assessment - 01/10/20 1528    Subjective Pt is here for SOZO screen    Pertinent History Patient was diagnosed on 08/03/2019 with 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 She underwent a left lumpectomy and 1 positive node removed on 10/07/2019. Ki67 is 20%. She has a history of uterine cancer in 2014 treated with hysterectomy and radiation, a left hip replacement, right knee replacement, and a C4-C7 fusion in 2014.                  L-DEX FLOWSHEETS - 01/10/20 1500      L-DEX  LYMPHEDEMA SCREENING   Measurement Type Unilateral    L-DEX MEASUREMENT EXTREMITY Upper Extremity    POSITION  Standing    DOMINANT SIDE Right    At Risk Side Left    BASELINE SCORE (UNILATERAL) 3.9                                  PT Long Term Goals - 11/01/19 1458      PT LONG TERM GOAL #1   Title Patient will demonstrate she has regained full shoulder ROM and function post operatively compared to baselines.    Time 8    Period Weeks    Status Achieved                 Plan - 01/10/20 1531    Clinical Impression Statement Pt measured within recommended range of baseline at this 3 month SOZO screen and will return in 3  months to continue lymphedema screening.    PT Treatment/Interventions ADLs/Self Care Home Management;Therapeutic exercise;Patient/family education    Consulted and Agree with Plan of Care Patient           Patient will benefit from skilled therapeutic intervention in order to improve the following deficits and impairments:  Decreased knowledge of precautions,Postural dysfunction,Decreased range of motion,Impaired UE functional use,Pain,Decreased scar mobility  Visit Diagnosis: Aftercare following surgery for neoplasm     Problem List Patient Active Problem List   Diagnosis Date Noted   Port-A-Cath in place 11/24/2019   Hepatic steatosis 09/28/2019   Malignant neoplasm of upper-outer quadrant of left breast in female, estrogen receptor positive ([place]) 09/02/2019   Endometrial cancer ([place]) 10/23/2018   Cirrhosis of liver without ascites ([place]) 10/23/2018   Diarrhea 10/23/2018   Chest pain 02/15/2018   Coronary artery disease involving native coronary artery of native heart without angina pectoris 11/18/2017   Hyperlipidemia LDL goal <70 11/18/2017   Type 2 diabetes mellitus without complication, without long-term current use of insulin ([place]) 11/18/2017   Dizziness 11/18/2017    Stark Bray 01/10/2020, 3:32 PM  [place], [place], [address] Phone: [phone]   Fax:  [phone]  Name: Felicia [name] MRN: [id number] Date of Birth: 1950/05/24

## 2020-01-17 ENCOUNTER — Other Ambulatory Visit: Payer: Self-pay

## 2020-01-17 ENCOUNTER — Ambulatory Visit (HOSPITAL_COMMUNITY)
Admission: RE | Admit: 2020-01-17 | Discharge: 2020-01-17 | Disposition: A | Discharge status: Home / Self Care | Payer: Medicare Other | Source: Ambulatory Visit | Attending: Oncology | Admitting: Oncology

## 2020-01-17 DIAGNOSIS — E119 Type 2 diabetes mellitus without complications: Secondary | ICD-10-CM | POA: Insufficient documentation

## 2020-01-17 DIAGNOSIS — C50412 Malignant neoplasm of upper-outer quadrant of left female breast: Secondary | ICD-10-CM

## 2020-01-17 DIAGNOSIS — E785 Hyperlipidemia, unspecified: Secondary | ICD-10-CM | POA: Diagnosis not present

## 2020-01-17 DIAGNOSIS — I351 Nonrheumatic aortic (valve) insufficiency: Secondary | ICD-10-CM | POA: Diagnosis not present

## 2020-01-17 DIAGNOSIS — Z170 Estrogen receptor positive status [ER+]: Secondary | ICD-10-CM | POA: Diagnosis not present

## 2020-01-17 DIAGNOSIS — Z0189 Encounter for other specified special examinations: Secondary | ICD-10-CM | POA: Diagnosis not present

## 2020-01-17 LAB — ECHOCARDIOGRAM COMPLETE
Area-P 1/2: 4.8 cm2
Calc EF: 54.8 %
P 1/2 time: 650 msec
S' Lateral: 2.5 cm
Single Plane A2C EF: 49.9 %
Single Plane A4C EF: 57.7 %

## 2020-01-17 NOTE — Progress Notes (Signed)
  Echocardiogram 2D Echocardiogram has been performed.  Felicia [name] 01/17/2020, 11:00 AM

## 2020-01-19 NOTE — Progress Notes (Signed)
The following biosimilar Ziextenzo (pegfilgrastim-bmez) has been selected for use in this patient.  Ebony Hail, Pharm.D., CPP 01/19/2020@4 :20 PM

## 2020-01-21 ENCOUNTER — Other Ambulatory Visit: Payer: Self-pay

## 2020-01-21 ENCOUNTER — Encounter: Payer: Self-pay | Admitting: Rehabilitation

## 2020-01-21 ENCOUNTER — Ambulatory Visit: Payer: Medicare Other | Attending: General Surgery | Admitting: Rehabilitation

## 2020-01-21 DIAGNOSIS — R2689 Other abnormalities of gait and mobility: Secondary | ICD-10-CM | POA: Insufficient documentation

## 2020-01-21 NOTE — Therapy (Signed)
[place], [place], [address] Phone: [phone]   Fax:  [phone]  Physical Therapy Evaluation  Patient Details  Name: Felicia [name] MRN: [id number] Date of Birth: 09/16/1950 Referring Provider (PT): Dr. Jana Hakim   Encounter Date: 01/21/2020   PT End of Session - 01/21/20 1050    Visit Number 3    Number of Visits 11    Date for PT Re-Evaluation 03/17/20    PT Start Time 1000    PT Stop Time 1047    PT Time Calculation (min) 47 min    Activity Tolerance Patient tolerated treatment well    Behavior During Therapy [facility] for tasks assessed/performed           Past Medical History:  Diagnosis Date  . Anemia   . Anxiety   . Breast cancer ([place]) 09/2019   left breast IMC  . CAD (coronary artery disease)    a. 3/2007s/p DES to the LAD [facility]); b. 01/2017 MV: EF 80%, small, mild apical ant defect w/o ischemia (felt to be breast atten). Low risk.  . Depression   . DM (diabetes mellitus) ([place])   . Essential hypertension   . GERD (gastroesophageal reflux disease)   . Headache    secondary to neck surgery per patient  . High triglycerides   . Hyperlipidemia   . Hypothyroid   . OA (osteoarthritis)    right knee,hands  . Overflow incontinence   . PONV (postoperative nausea and vomiting)    s/p gallbladder   . RLS (restless legs syndrome)   . Urinary urgency   . Uterine cancer ([place]) dx'd 2014    Past Surgical History:  Procedure Laterality Date  . BREAST LUMPECTOMY WITH RADIOACTIVE SEED AND SENTINEL LYMPH NODE BIOPSY Left 10/07/2019   Procedure: LEFT BREAST LUMPECTOMY WITH RADIOACTIVE SEED AND SENTINEL LYMPH NODE BIOPSY;  Surgeon: Rolm Bookbinder, MD;  Location: [place];  Service: General;  Laterality: Left;  . CORONARY STENT PLACEMENT    . CORONARY/GRAFT ACUTE MI REVASCULARIZATION    . FINGER ARTHRODESIS Right 06/11/2019   Procedure: ARTHRODESIS INDEX FINGER DISTAL  PHALANGEAL JOINT;  Surgeon: [name], MD;  Location: [place];  Service: Orthopedics;  Laterality: Right;  . FOOT SURGERY Left   . LEFT HEART CATH AND CORONARY ANGIOGRAPHY N/A 02/16/2018   Procedure: LEFT HEART CATH AND CORONARY ANGIOGRAPHY;  Surgeon: Lorretta Harp, MD;  Location: [facility];  Service: Cardiovascular;  Laterality: N/A;  . PORTACATH PLACEMENT Right 11/23/2019   Procedure: INSERTION PORT-A-CATH WITH ULTRASOUND GUIDANCE;  Surgeon: Rolm Bookbinder, MD;  Location: [place];  Service: General;  Laterality: Right;  . REPLACEMENT TOTAL KNEE Right   . SPINE SURGERY    . TOTAL ABDOMINAL HYSTERECTOMY     FOR UTERUS CANCER  . TOTAL HIP ARTHROPLASTY Left     There were no vitals filed for this visit.    Subjective Assessment - 01/21/20 0956    Subjective I lost balance last night but didn't fall.  I fell the week before when my Rt leg sometimes collapses, it has forever even before the knee replacement. I walk like I'm drunk mostly to the left.  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 She underwent a left lumpectomy and 1 positive node removed on 10/07/2019. Ki67 is  20%. She has a history of uterine cancer in 2014 treated with hysterectomy and radiation, a left hip replacement, right knee replacement, and a C4-C7 fusion in 2014.    Currently in Pain? Other (Comment)   I have pain all over; now it is my Rt foot and hip 4/10             [facility] PT Assessment - 01/21/20 0001      Assessment   Medical Diagnosis gait and balance    Referring Provider (PT) Dr. Jana Hakim    Onset Date/Surgical Date 01/14/14    Hand Dominance Right      Precautions   Precautions Fall    Precaution Comments lymphedema      Restrictions   Weight Bearing Restrictions No      Balance Screen   Has the  patient fallen in the past 6 months Yes    How many times? 3    Has the patient had a decrease in activity level because of a fear of falling?  Yes    Is the patient reluctant to leave their home because of a fear of falling?  No      Home Environment   Living Environment Private residence    Living Arrangements Alone    Available Help at Discharge Family      Prior Function   Level of Independence Independent    Vocation Part time employment    Vocation Requirements Works part time at [employer] She does not exercise      Cognition   Overall Cognitive Status Within Functional Limits for tasks assessed      Strength   Overall Strength Comments tested seated    Strength Assessment Site Hip;Knee;Ankle    Right/Left Hip Right;Left    Right Hip Flexion 4/5    Right Hip External Rotation  4-/5    Right Hip Internal Rotation 4/5    Right Hip ABduction 4/5    Right Hip ADduction 4/5    Left Hip Flexion 3/5   painful   Left Hip External Rotation 4-/5    Left Hip Internal Rotation 4/5    Left Hip ABduction 4/5    Left Hip ADduction 4/5    Right/Left Knee Right;Left    Right Knee Flexion 4+/5    Right Knee Extension 4+/5    Left Knee Flexion 4+/5    Left Knee Extension 4+/5    Right/Left Ankle Right;Left    Right Ankle Dorsiflexion 5/5    Right Ankle Plantar Flexion 4/5    Right Ankle Inversion 4+/5    Right Ankle Eversion 4+/5    Left Ankle Dorsiflexion 5/5    Left Ankle Plantar Flexion 4/5    Left Ankle Inversion 4+/5    Left Ankle Eversion 4+/5      Palpation   Palpation comment bil muscle tension in UT with reported headaches that have been improved with cymbalta or lyrica.      Ambulation/Gait   Gait Comments uses SPC or RW when needed and has a SPC in the car to use when needed. The balance is worse when I am tired. pt with careful gait in the hallway having to pause and hold the wall 2 times.  slight waddling gait      Balance   Balance Assessed Yes       Standardized Balance Assessment   Standardized Balance Assessment Timed Up and Go Test;Five Times Sit to [facility] Balance Test  Balance Regulatory affairs officer Test    Five times sit to stand comments  22.29 - normal value 12.6 seconds   using hands on knees     Berg Balance Test   Sit to Stand Able to stand without using hands and stabilize independently    Standing Unsupported Able to stand safely 2 minutes    Sitting with Back Unsupported but Feet Supported on Floor or Stool Able to sit safely and securely 2 minutes    Stand to Sit Controls descent by using hands    Transfers Able to transfer safely, definite need of hands    Standing Unsupported with Eyes Closed Unable to keep eyes closed 3 seconds but stays steady   backwards LOB   Standing Unsupported with Feet Together Able to place feet together independently and stand for 1 minute with supervision    From Standing, Reach Forward with Outstretched Arm Can reach confidently >25 cm (10")    From Standing Position, Pick up Object from Floor Unable to try/needs assist to keep balance    From Standing Position, Turn to Look Behind Over each Shoulder Needs supervision when turning    Turn 360 Degrees Needs close supervision or verbal cueing   I felt like I was going to get dizzy   Standing Unsupported, Alternately Place Feet on Step/Stool Able to complete >2 steps/needs minimal assist    Standing Unsupported, One Foot in [employer] balance while stepping or standing    Standing on One Leg Able to lift leg independently and hold equal to or more than 3 seconds   R: 3 seconds, L: 8 seconds with CGA in corner   Total Score 31    Berg comment: High risk for falls      Timed Up and Go Test   Normal TUG (seconds) 13.75    TUG Comments high risk for falls      High Level Balance   High Level Balance Comments feet together on foam EC with LOB posteriorly                      Objective measurements completed  on examination: See above findings.                    PT Long Term Goals - 01/21/20 1056      PT LONG TERM GOAL #1   Title Pt will report ability to get off the commode without pulling on the vanity    Time 8    Period Weeks    Status New      PT LONG TERM GOAL #2   Title Pt will improve BERG balance score to 45 or better to decrease risk of falls    Time 8    Period Weeks    Status New      PT LONG TERM GOAL #3   Title Pt will report no LOB backwards with standing activities x 1 week    Time 8    Period Weeks      PT LONG TERM GOAL #4   Title Pt will be ind with HEP similar to OTAGO    Time 8    Period Weeks    Status New                  Plan - 01/21/20 1051    Clinical Impression Statement Pt returns to PT today to assess gait and balance.  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  Balance never fully recovered.  Pt also has a history of vertigo/ BPPV which she does not currently have but does demonstrate some motion sensitivity with turning in a circle or behind her quickly.  Pt demonstrates high fall risk on all assessments today and was educated on use of SPC or RW to prevent falls which she has but does not use consistently.  Pt is currently getting chemotherapy infusions every 3 weeks and has 3 remaining but feels okay to start balance training.  Pt also limited by R TKR, L THR, and the need for a R THR which can give some pain intermittently.    Personal Factors and Comorbidities Age;Fitness;Comorbidity 2    Examination-Activity Limitations Locomotion Level;Stairs    Examination-Participation Restrictions Cleaning;Meal Prep;Yard Work;Community Activity;Occupation;Shop    Stability/Clinical Decision Making Stable/Uncomplicated    Clinical Decision Making Low    Rehab Potential Fair    PT Frequency 1x / week    PT Duration 8 weeks    PT  Treatment/Interventions ADLs/Self Care Home Management;Therapeutic exercise;Patient/family education;Balance training;Neuromuscular re-education;Manual techniques    PT Next Visit Plan start general balance/conditioning type activities cautious of joint pain in the LEs, parallel bars, fall risk should use gait belt, eventually working towards [place] or other general strength and balance HEP;   include heel raises, sit to stand as able,  breast care/shoulder/lymphedema PRN    Consulted and Agree with Plan of Care Patient           Patient will benefit from skilled therapeutic intervention in order to improve the following deficits and impairments:  Decreased knowledge of precautions,Postural dysfunction,Decreased range of motion,Impaired UE functional use,Pain,Decreased scar mobility,Difficulty walking,Abnormal gait  Visit Diagnosis: Other abnormalities of gait and mobility     Problem List Patient Active Problem List   Diagnosis Date Noted  . Port-A-Cath in place 11/24/2019  . Hepatic steatosis 09/28/2019  . Malignant neoplasm of upper-outer quadrant of left breast in female, estrogen receptor positive ([place]) 09/02/2019  . Endometrial cancer ([place]) 10/23/2018  . Cirrhosis of liver without ascites ([place]) 10/23/2018  . Diarrhea 10/23/2018  . Chest pain 02/15/2018  . Coronary artery disease involving native coronary artery of native heart without angina pectoris 11/18/2017  . Hyperlipidemia LDL goal <70 11/18/2017  . Type 2 diabetes mellitus without complication, without long-term current use of insulin ([place]) 11/18/2017  . Dizziness 11/18/2017    Stark Bray 01/21/2020, 10:59 AM  [place], [place], [address] Phone: [phone]   Fax:  [phone]  Name: Felicia [name] MRN: [id number] Date of Birth: [birth year]

## 2020-01-26 ENCOUNTER — Inpatient Hospital Stay: Payer: Medicare Other | Admitting: Adult Health

## 2020-01-26 ENCOUNTER — Inpatient Hospital Stay: Payer: Medicare Other | Attending: Oncology

## 2020-01-26 ENCOUNTER — Inpatient Hospital Stay: Payer: Medicare Other

## 2020-01-26 ENCOUNTER — Encounter: Payer: Self-pay | Admitting: Adult Health

## 2020-01-26 ENCOUNTER — Other Ambulatory Visit: Payer: Self-pay

## 2020-01-26 ENCOUNTER — Encounter: Payer: Self-pay | Admitting: Licensed Clinical Social Worker

## 2020-01-26 ENCOUNTER — Telehealth: Payer: Self-pay

## 2020-01-26 VITALS — BP 109/52 | HR 70 | Temp 97.6°F | Resp 18 | Ht 64.0 in | Wt 172.0 lb

## 2020-01-26 DIAGNOSIS — I351 Nonrheumatic aortic (valve) insufficiency: Secondary | ICD-10-CM | POA: Insufficient documentation

## 2020-01-26 DIAGNOSIS — Z800 Family history of malignant neoplasm of digestive organs: Secondary | ICD-10-CM | POA: Diagnosis not present

## 2020-01-26 DIAGNOSIS — Z5112 Encounter for antineoplastic immunotherapy: Secondary | ICD-10-CM | POA: Diagnosis not present

## 2020-01-26 DIAGNOSIS — Z8249 Family history of ischemic heart disease and other diseases of the circulatory system: Secondary | ICD-10-CM | POA: Diagnosis not present

## 2020-01-26 DIAGNOSIS — Z803 Family history of malignant neoplasm of breast: Secondary | ICD-10-CM | POA: Diagnosis not present

## 2020-01-26 DIAGNOSIS — Z818 Family history of other mental and behavioral disorders: Secondary | ICD-10-CM | POA: Insufficient documentation

## 2020-01-26 DIAGNOSIS — G629 Polyneuropathy, unspecified: Secondary | ICD-10-CM | POA: Insufficient documentation

## 2020-01-26 DIAGNOSIS — Z95828 Presence of other vascular implants and grafts: Secondary | ICD-10-CM

## 2020-01-26 DIAGNOSIS — Z170 Estrogen receptor positive status [ER+]: Secondary | ICD-10-CM

## 2020-01-26 DIAGNOSIS — Z90722 Acquired absence of ovaries, bilateral: Secondary | ICD-10-CM | POA: Insufficient documentation

## 2020-01-26 DIAGNOSIS — E1042 Type 1 diabetes mellitus with diabetic polyneuropathy: Secondary | ICD-10-CM | POA: Insufficient documentation

## 2020-01-26 DIAGNOSIS — Z8544 Personal history of malignant neoplasm of other female genital organs: Secondary | ICD-10-CM | POA: Insufficient documentation

## 2020-01-26 DIAGNOSIS — C50412 Malignant neoplasm of upper-outer quadrant of left female breast: Secondary | ICD-10-CM

## 2020-01-26 DIAGNOSIS — Z5111 Encounter for antineoplastic chemotherapy: Secondary | ICD-10-CM | POA: Diagnosis not present

## 2020-01-26 DIAGNOSIS — Z79899 Other long term (current) drug therapy: Secondary | ICD-10-CM | POA: Diagnosis not present

## 2020-01-26 DIAGNOSIS — M1711 Unilateral primary osteoarthritis, right knee: Secondary | ICD-10-CM | POA: Diagnosis not present

## 2020-01-26 DIAGNOSIS — I340 Nonrheumatic mitral (valve) insufficiency: Secondary | ICD-10-CM | POA: Insufficient documentation

## 2020-01-26 DIAGNOSIS — E785 Hyperlipidemia, unspecified: Secondary | ICD-10-CM | POA: Insufficient documentation

## 2020-01-26 DIAGNOSIS — I10 Essential (primary) hypertension: Secondary | ICD-10-CM | POA: Insufficient documentation

## 2020-01-26 DIAGNOSIS — I2510 Atherosclerotic heart disease of native coronary artery without angina pectoris: Secondary | ICD-10-CM | POA: Insufficient documentation

## 2020-01-26 DIAGNOSIS — Z833 Family history of diabetes mellitus: Secondary | ICD-10-CM | POA: Insufficient documentation

## 2020-01-26 LAB — CBC WITH DIFFERENTIAL (CANCER CENTER ONLY)
Abs Immature Granulocytes: 0.06 10*3/uL (ref 0.00–0.07)
Basophils Absolute: 0 10*3/uL (ref 0.0–0.1)
Basophils Relative: 0 %
Eosinophils Absolute: 0.1 10*3/uL (ref 0.0–0.5)
Eosinophils Relative: 0 %
HCT: 39.2 % (ref 36.0–46.0)
Hemoglobin: 12.5 g/dL (ref 12.0–15.0)
Immature Granulocytes: 0 %
Lymphocytes Relative: 50 %
Lymphs Abs: 7.7 10*3/uL — ABNORMAL HIGH (ref 0.7–4.0)
MCH: 32.6 pg (ref 26.0–34.0)
MCHC: 31.9 g/dL (ref 30.0–36.0)
MCV: 102.3 fL — ABNORMAL HIGH (ref 80.0–100.0)
Monocytes Absolute: 0.9 10*3/uL (ref 0.1–1.0)
Monocytes Relative: 6 %
Neutro Abs: 6.7 10*3/uL (ref 1.7–7.7)
Neutrophils Relative %: 44 %
Platelet Count: 231 10*3/uL (ref 150–400)
RBC: 3.83 MIL/uL — ABNORMAL LOW (ref 3.87–5.11)
RDW: 20.5 % — ABNORMAL HIGH (ref 11.5–15.5)
WBC Count: 15.4 10*3/uL — ABNORMAL HIGH (ref 4.0–10.5)
nRBC: 0 % (ref 0.0–0.2)

## 2020-01-26 LAB — CMP (CANCER CENTER ONLY)
ALT: 27 U/L (ref 0–44)
AST: 20 U/L (ref 15–41)
Albumin: 3.9 g/dL (ref 3.5–5.0)
Alkaline Phosphatase: 98 U/L (ref 38–126)
Anion gap: 10 (ref 5–15)
BUN: 18 mg/dL (ref 8–23)
CO2: 22 mmol/L (ref 22–32)
Calcium: 9.9 mg/dL (ref 8.9–10.3)
Chloride: 107 mmol/L (ref 98–111)
Creatinine: 0.84 mg/dL (ref 0.44–1.00)
GFR, Estimated: >60 mL/min (ref >60)
Glucose, Bld: 235 mg/dL — ABNORMAL HIGH (ref 70–99)
Potassium: 4.2 mmol/L (ref 3.5–5.1)
Sodium: 139 mmol/L (ref 135–145)
Total Bilirubin: 0.4 mg/dL (ref 0.3–1.2)
Total Protein: 7 g/dL (ref 6.5–8.1)

## 2020-01-26 MED ORDER — DIPHENHYDRAMINE HCL 25 MG PO CAPS
ORAL_CAPSULE | ORAL | Status: AC
  Filled 2020-01-26: qty 1

## 2020-01-26 MED ORDER — PROCHLORPERAZINE EDISYLATE 10 MG/2ML IJ SOLN
INTRAMUSCULAR | Status: AC
  Filled 2020-01-26: qty 2

## 2020-01-26 MED ORDER — SODIUM CHLORIDE 0.9% FLUSH
10.0000 mL | INTRAVENOUS | Status: DC | PRN
  Administered 2020-01-26: 10 mL
  Filled 2020-01-26: qty 10

## 2020-01-26 MED ORDER — SODIUM CHLORIDE 0.9 % IV SOLN
800.0000 mg/m2 | Freq: Once | INTRAVENOUS | Status: AC
  Administered 2020-01-26: 1520 mg via INTRAVENOUS
  Filled 2020-01-26: qty 39.98

## 2020-01-26 MED ORDER — PALONOSETRON HCL INJECTION 0.25 MG/5ML
0.2500 mg | Freq: Once | INTRAVENOUS | Status: AC
Start: 2020-01-26 — End: 2020-01-26
  Administered 2020-01-26: 0.25 mg via INTRAVENOUS

## 2020-01-26 MED ORDER — PROCHLORPERAZINE EDISYLATE 10 MG/2ML IJ SOLN
10.0000 mg | Freq: Once | INTRAMUSCULAR | Status: AC
  Administered 2020-01-26: 10 mg via INTRAVENOUS

## 2020-01-26 MED ORDER — SODIUM CHLORIDE 0.9 % IV SOLN
450.0000 mg | Freq: Once | INTRAVENOUS | Status: AC
  Administered 2020-01-26: 450 mg via INTRAVENOUS
  Filled 2020-01-26: qty 45

## 2020-01-26 MED ORDER — SODIUM CHLORIDE 0.9 % IV SOLN
Freq: Once | INTRAVENOUS | Status: AC
  Filled 2020-01-26: qty 250

## 2020-01-26 MED ORDER — TRASTUZUMAB-DKST CHEMO 150 MG IV SOLR
450.0000 mg | Freq: Once | INTRAVENOUS | Status: AC
  Administered 2020-01-26: 450 mg via INTRAVENOUS
  Filled 2020-01-26: qty 21.43

## 2020-01-26 MED ORDER — ACETAMINOPHEN 325 MG PO TABS
ORAL_TABLET | ORAL | Status: AC
  Filled 2020-01-26: qty 2

## 2020-01-26 MED ORDER — DIPHENHYDRAMINE HCL 25 MG PO CAPS
25.0000 mg | ORAL_CAPSULE | Freq: Once | ORAL | Status: AC
  Administered 2020-01-26: 25 mg via ORAL

## 2020-01-26 MED ORDER — ACETAMINOPHEN 325 MG PO TABS
650.0000 mg | ORAL_TABLET | Freq: Once | ORAL | Status: AC
  Administered 2020-01-26: 650 mg via ORAL

## 2020-01-26 MED ORDER — PALONOSETRON HCL INJECTION 0.25 MG/5ML
INTRAVENOUS | Status: AC
  Filled 2020-01-26: qty 5

## 2020-01-26 MED ORDER — SODIUM CHLORIDE 0.9 % IV SOLN
150.0000 mg | Freq: Once | INTRAVENOUS | Status: AC
  Administered 2020-01-26: 150 mg via INTRAVENOUS
  Filled 2020-01-26: qty 150

## 2020-01-26 MED ORDER — HEPARIN SOD (PORK) LOCK FLUSH 100 UNIT/ML IV SOLN
500.0000 [IU] | Freq: Once | INTRAVENOUS | Status: AC | PRN
Start: 2020-01-26 — End: 2020-01-26
  Administered 2020-01-26: 500 [IU]
  Filled 2020-01-26: qty 5

## 2020-01-26 MED ORDER — SODIUM CHLORIDE 0.9% FLUSH
10.0000 mL | Freq: Once | INTRAVENOUS | Status: AC
  Administered 2020-01-26: 10 mL
  Filled 2020-01-26: qty 10

## 2020-01-26 NOTE — Progress Notes (Signed)
[place]  [phone] Fax:(336) [phone]     ID: Felicia [name] DOB: 11/04/1950  MR#: [id number]  AJG#:[id number]  Patient Care Team: Kathyrn Lass, MD as PCP - General (Family Medicine) Nahser, [name], MD as PCP - Cardiology (Cardiology) Mauro Kaufmann, RN as Oncology Nurse Navigator Rockwell Germany, RN as Oncology Nurse Navigator Rolm Bookbinder, MD as Consulting Physician (General Surgery) Magrinat, Virgie Dad, MD as Consulting Physician (Oncology) Eppie Gibson, MD as Attending Physician (Radiation Oncology) [name], MD as Consulting Physician (Rheumatology) [name], MD as Consulting Physician (Orthopedic Surgery) Janyth Pupa, DO as Consulting Physician (Obstetrics and Gynecology) Larey Dresser, MD as Consulting Physician (Cardiology) Madelin Rear, MD as Consulting Physician (Endocrinology) Scot Dock, NP OTHER MD:  CHIEF COMPLAINT: Triple positive invasive breast cancer  CURRENT TREATMENT: Trastuzumab, gemcitabine, carboplatin   INTERVAL HISTORY: Felicia [name] returns today for follow up and treatment of her triple positive invasive breast cancer.    She began adjuvant chemotherapy, consisting of carboplatin, gemcitabine, trastuzumab, on 11/24/2019. Today is day 1 cycle 4 of her treatment.   To review, her treatment has been tailored to her comorbidities.  We were not able to give her docetaxel because of neuropathy and diabetes issues.    Her most recent echocardiogram from 01/17/2020 showed a normal EF of 60-65%.   REVIEW OF SYSTEMS: Felicia [name] is doing moderately well.  She notes that last Thursday she wasn't feeling well.  She is unsure the cause.  She notes fatigue, loose stools, and she lost her balance and ended up leaving work early.  She notes that it has a lot to do with being tired, and slept 7 hours once she went home.    Felicia [name] notes that her diarrhea comes and goes.  She notes that she is normally  constipated so the increased frequency in BMs is a welcome change. She says that on this day a week ago, it was runny and three times, but it has since resolved.  Felicia [name] forces herself out of bed every morning.  She works at [employer], and she is working there on Saturdays, Sundays, Tuesday, and Thursday about 4 hours per day.  She works in Geologist, engineering.    She denies any fever, chills, cough, bladder changes, headaches, vision issues, nausea, vomiting, and significant appetite changes.  Otherwise a detailed ROS was non contributory today.    She has not been following her blood glucose.  She is seeing an endocrinologist for this.  Felicia [name] follows up with her in March, 2022.    COVID 19 VACCINATION STATUS: Refuses vaccination   HISTORY OF CURRENT ILLNESS: From the original intake note:  Felicia [name] had routine screening mammography on 08/03/2019 showing a possible abnormality in the left breast. She underwent left diagnostic mammography with tomography and left breast ultrasonography at [facility] on 08/17/2019 showing: breast density category B; 1.6 cm upper-outer left breast mass; no significant left axillary abnormalities.  Accordingly on 08/30/2019 she proceeded to biopsy of the left breast area in question. The pathology from this procedure ([id number]) showed: invasive mammary carcinoma, e-cadherin positive, grade 2. Prognostic indicators significant for: estrogen receptor, 100% positive and progesterone receptor, 40% positive, both with strong staining intensity. Proliferation marker Ki67 at 20%. HER2 equivocal by immunohistochemistry (2+), but negative by fluorescent in situ hybridization with a signals ratio   and number per cell  .  Of note, she also has a history of FIGO grade 3, stage IA endometrial carcinoma, diagnosed in 2014 in  [place]. She underwent robotic assisted total hysterectomy with BSO and pelvic and perirectal aortic lymph node dissection as well as omental biopsy.  Tumor  was found only in the endometrium arising in a polyp measuring 1.7 cm.  There was no myometrial invasion no lymphovascular space invasion, and no lower uterine segment involvement.  The cervix and adnexa, lymph nodes and omentum were all not involved.  She then received vaginal brachytherapy, no chemotherapy, completing treatment in the springtime of 2015. [Per patient's report, chemotherapy was suggested, but she opted against this.]  The patient's subsequent history is as detailed below.   PAST MEDICAL HISTORY: Past Medical History:  Diagnosis Date  . Anemia   . Anxiety   . Breast cancer ([place]) 09/2019   left breast IMC  . CAD (coronary artery disease)    a. 3/2007s/p DES to the LAD [facility]); b. 01/2017 MV: EF 80%, small, mild apical ant defect w/o ischemia (felt to be breast atten). Low risk.  . Depression   . DM (diabetes mellitus) ([place])   . Essential hypertension   . GERD (gastroesophageal reflux disease)   . Headache    secondary to neck surgery per patient  . High triglycerides   . Hyperlipidemia   . Hypothyroid   . OA (osteoarthritis)    right knee,hands  . Overflow incontinence   . PONV (postoperative nausea and vomiting)    s/p gallbladder   . RLS (restless legs syndrome)   . Urinary urgency   . Uterine cancer ([place]) dx'd 2014    PAST SURGICAL HISTORY: Past Surgical History:  Procedure Laterality Date  . BREAST LUMPECTOMY WITH RADIOACTIVE SEED AND SENTINEL LYMPH NODE BIOPSY Left 10/07/2019   Procedure: LEFT BREAST LUMPECTOMY WITH RADIOACTIVE SEED AND SENTINEL LYMPH NODE BIOPSY;  Surgeon: Rolm Bookbinder, MD;  Location: [place];  Service: General;  Laterality: Left;  . CORONARY STENT PLACEMENT    . CORONARY/GRAFT ACUTE MI REVASCULARIZATION    . FINGER ARTHRODESIS Right 06/11/2019   Procedure: ARTHRODESIS INDEX FINGER DISTAL PHALANGEAL JOINT;  Surgeon: [name], MD;  Location: [place];  Service: Orthopedics;  Laterality:  Right;  . FOOT SURGERY Left   . LEFT HEART CATH AND CORONARY ANGIOGRAPHY N/A 02/16/2018   Procedure: LEFT HEART CATH AND CORONARY ANGIOGRAPHY;  Surgeon: Lorretta Harp, MD;  Location: [place];  Service: Cardiovascular;  Laterality: N/A;  . PORTACATH PLACEMENT Right 11/23/2019   Procedure: INSERTION PORT-A-CATH WITH ULTRASOUND GUIDANCE;  Surgeon: Rolm Bookbinder, MD;  Location: [place];  Service: General;  Laterality: Right;  . REPLACEMENT TOTAL KNEE Right   . SPINE SURGERY    . TOTAL ABDOMINAL HYSTERECTOMY     FOR UTERUS CANCER  . TOTAL HIP ARTHROPLASTY Left     FAMILY HISTORY: Family History  Problem Relation Age of Onset  . AAA (abdominal aortic aneurysm) Mother   . Diabetes Father   . Alzheimer's disease Father   . Throat cancer Sister   . Breast cancer Cousin    Her father died at age 96 from Alzheimer's. Her mother died at age 40 from a ruptured abdominal aneurysm. Devonna has 2 brothers and 3 sisters. She reports breast cancer in a paternal cousin. She also notes throat cancer in her sister.   GYNECOLOGIC HISTORY:  No LMP recorded. Patient has had a hysterectomy. Menarche: 70 years old Age at first live birth: 70 years old Eunice P 2 LMP 1996 Contraceptive: prior use without issue HRT never  used  Hysterectomy? Yes, 2014 BSO? 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  She unpacks 40 pounds boxes of chicken, has to cook them in 13 pound cooking pans, and is generally on her feet at work but does not otherwise exercise.  She is divorced. She lives at home by herself, with no pets. Daughter Pershing Proud, age 16, is a radiology tech in [place]. Daughter Martyn Malay, age 51, is an Optometrist in [place], [place]. Marisella has one grandchild. She is not a Ambulance person.    ADVANCED DIRECTIVES: Not in place; intends to name daughter [name] as her HCPOA.   HEALTH MAINTENANCE: Social History    Tobacco Use  . Smoking status: Never Smoker  . Smokeless tobacco: Never Used  Vaping Use  . Vaping Use: Never used  Substance Use Topics  . Alcohol use: Yes    Comment: OCCASIONALLY  . Drug use: No     Colonoscopy: <10 years ago, in [place]  PAP: 2014?  Bone density: date unsure, also in [place]   Allergies  Allergen Reactions  . Chloraprep One Step [Chlorhexidine Gluconate] Rash  . Estrogens Other (See Comments)    PATIENT HAS A HISTORY OF CANCER AND HAS BEEN TOLD TO NEVER TAKE ANYTHING CONTAINING ESTROGEN, AS IT MIGHT CAUSE A RECURRENCE  . Shrimp [Shellfish Allergy] Nausea And Vomiting    Current Outpatient Medications  Medication Sig Dispense Refill  . ACCU-CHEK GUIDE test strip 1 each by Other route as needed.    . Ascorbic Acid (VITAMIN C PO) Take 1 tablet by mouth daily.    Marland Kitchen aspirin EC 81 MG tablet Take 81 mg by mouth daily.    Marland Kitchen atorvastatin (LIPITOR) 40 MG tablet TAKE 1 TABLET BY MOUTH EVERY DAY 90 tablet 1  . bisoprolol (ZEBETA) 5 MG tablet TAKE 1 TABLET BY MOUTH EVERY DAY 90 tablet 2  . Calcium Citrate-Vitamin D (CALCIUM + D PO) Take 1 tablet by mouth daily with supper.    Marland Kitchen CINNAMON PO Take 1 capsule by mouth daily with supper.    . diclofenac sodium (VOLTAREN) 1 % GEL Apply 2-4 g topically 4 (four) times daily as needed (as directed for pain).    Marland Kitchen diclofenac Sodium (VOLTAREN) 1 % GEL Apply 1 application topically 3 (three) times daily as needed.    . Dulaglutide (TRULICITY) 1.5 KW/4.0XB SOPN Inject 1.5 mg into the skin once a week.    . escitalopram (LEXAPRO) 10 MG tablet Take 10 mg by mouth daily.    . Ferrous Sulfate (IRON PO) Take 1 tablet by mouth daily. alternates days:1 tablet one day and 2 tablets the next day    . ferrous sulfate 325 (65 FE) MG EC tablet Take 325 mg by mouth 3 (three) times daily with meals.    Marland Kitchen GLIPIZIDE XL 10 MG 24 hr tablet Take 20 mg by mouth daily.   3  . JARDIANCE 25 MG TABS tablet Take 25 mg by mouth every morning.   3  . levothyroxine  (SYNTHROID, LEVOTHROID) 125 MCG tablet Take 125 mcg by mouth daily before breakfast.    . lidocaine-prilocaine (EMLA) cream Apply to affected area once 30 g 3  . metFORMIN (GLUCOPHAGE) 500 MG tablet Take 500 mg by mouth 2 (two) times daily with a meal.    . metroNIDAZOLE (METROGEL) 1 % gel Apply 1 application topically as needed (as directed to affected area).     . Multiple Vitamins-Calcium (  ONE-A-DAY WOMENS PO) Take 1 tablet by mouth daily.    . nitroGLYCERIN (NITROSTAT) 0.4 MG SL tablet Place 1 tablet (0.4 mg total) under the tongue every 5 (five) minutes as needed for chest pain. 25 tablet 6  . omeprazole (PRILOSEC) 40 MG capsule Take 40 mg by mouth daily with supper.     . ondansetron (ZOFRAN) 8 MG tablet Take 1 tablet (8 mg total) by mouth 2 (two) times daily as needed for refractory nausea / vomiting. Start on day 3 after chemo. 30 tablet 1  . prochlorperazine (COMPAZINE) 10 MG tablet Take 1 tablet by mouth before meals and at bedtime on days 2 and 3 after chemotherapy (counting chemotherapy day as day 1); after that may take as needed 30 tablet 1  . rOPINIRole (REQUIP) 0.5 MG tablet Take 0.5 mg by mouth at bedtime. 1-3 hours prior to bedtime    . tiZANidine (ZANAFLEX) 4 MG tablet Take 4 mg by mouth 3 (three) times daily. As needed    . TURMERIC PO Take 1 capsule by mouth daily with supper.     No current facility-administered medications for this visit.    OBJECTIVE: White woman who appears stated age  10:   01/26/20 0957  BP: (!) 109/52  Pulse: 70  Resp: 18  Temp: 97.6 F (36.4 C)  SpO2: 97%     Body mass index is 29.52 kg/m.   Wt Readings from Last 3 Encounters:  01/26/20 172 lb (78 kg)  01/04/20 175 lb 4.8 oz (79.5 kg)  12/15/19 174 lb 14.4 oz (79.3 kg)      ECOG FS:1 - Symptomatic but completely ambulatory  GENERAL: Patient is a well appearing female in no acute distress HEENT:  Sclerae anicteric.  Oropharynx clear and moist. No ulcerations or evidence of  oropharyngeal candidiasis. Neck is supple.  NODES:  No cervical, supraclavicular, or axillary lymphadenopathy palpated.  BREAST EXAM:  Deferred. LUNGS:  Clear to auscultation bilaterally.  No wheezes or rhonchi. HEART:  Regular rate and rhythm. No murmur appreciated. ABDOMEN:  Soft, nontender.  Positive, normoactive bowel sounds. No organomegaly palpated. MSK:  No focal spinal tenderness to palpation. Full range of motion bilaterally in the upper extremities. EXTREMITIES:  No peripheral edema.   SKIN:  Clear with no obvious rashes or skin changes. No nail dyscrasia. NEURO:  Nonfocal. Well oriented.  Appropriate affect.     LAB RESULTS:  CMP     Component Value Date/Time   NA 140 01/04/2020 0903   NA 139 11/25/2018 1053   K 3.8 01/04/2020 0903   CL 107 01/04/2020 0903   CO2 23 01/04/2020 0903   GLUCOSE 229 (H) 01/04/2020 0903   BUN 15 01/04/2020 0903   BUN 18 11/25/2018 1053   CREATININE 0.71 01/04/2020 0903   CALCIUM 9.6 01/04/2020 0903   PROT 6.7 01/04/2020 0903   PROT 7.1 11/25/2018 1053   ALBUMIN 3.7 01/04/2020 0903   ALBUMIN 4.2 11/25/2018 1053   AST 16 01/04/2020 0903   ALT 23 01/04/2020 0903   ALKPHOS 93 01/04/2020 0903   BILITOT 0.3 01/04/2020 0903   GFRNONAA >60 01/04/2020 0903   GFRAA >60 10/04/2019 1330   GFRAA >60 09/08/2019 0811    No results found for: TOTALPROTELP, ALBUMINELP, A1GS, A2GS, BETS, BETA2SER, GAMS, MSPIKE, SPEI  Lab Results  Component Value Date   WBC 15.4 (H) 01/26/2020   NEUTROABS 6.7 01/26/2020   HGB 12.5 01/26/2020   HCT 39.2 01/26/2020   MCV 102.3 (H) 01/26/2020  PLT 231 01/26/2020    No results found for: LABCA2  No components found for: [id number]  No results for input(s): INR in the last 168 hours.  No results found for: LABCA2  No results found for: EXH371  No results found for: IRC789  No results found for: FYB017  No results found for: CA2729  No components found for: HGQUANT  No results found for: CEA1 / No  results found for: CEA1   No results found for: AFPTUMOR  No results found for: CHROMOGRNA  No results found for: KPAFRELGTCHN, LAMBDASER, KAPLAMBRATIO (kappa/lambda light chains)  No results found for: HGBA, HGBA2QUANT, HGBFQUANT, HGBSQUAN (Hemoglobinopathy evaluation)   No results found for: LDH  No results found for: IRON, TIBC, IRONPCTSAT (Iron and TIBC)  No results found for: FERRITIN  Urinalysis No results found for: COLORURINE, APPEARANCEUR, LABSPEC, PHURINE, GLUCOSEU, HGBUR, BILIRUBINUR, KETONESUR, PROTEINUR, UROBILINOGEN, NITRITE, LEUKOCYTESUR   STUDIES: ECHOCARDIOGRAM COMPLETE  Result Date: 01/17/2020    ECHOCARDIOGRAM REPORT   Patient Name:   NATAUSHA JUNGWIRTH Date of Exam: 01/17/2020 Medical Rec #:  [id number]     Height:       64.0 in Accession #:    [id number]    Weight:       175.3 lb Date of Birth:  08-04-1950    BSA:          1.850 m Patient Age:    61 years      BP:           115/67 mmHg Patient Gender: F             HR:           64 bpm. Exam Location:  Outpatient Procedure: 2D Echo, 3D Echo, Cardiac Doppler, Color Doppler and Strain Analysis Indications:    Z51.11 Encounter for antineoplastic chemotheraphy  History:        Patient has prior history of Echocardiogram examinations, most                 recent 09/15/2019. CAD, Signs/Symptoms:Chest Pain and                 Dizziness/Lightheadedness; Risk Factors:Diabetes and                 Dyslipidemia. Metastatic breast cancer.  Sonographer:    Roseanna Rainbow Referring Phys: [place]  1. Left ventricular ejection fraction, by estimation, is 60 to 65%. The left ventricle has normal function. The left ventricle has no regional wall motion abnormalities. There is mild concentric left ventricular hypertrophy. Left ventricular diastolic parameters are indeterminate. The average left ventricular global longitudinal strain is -22.4 %. The global longitudinal strain is normal.  2. Right ventricular systolic function is  normal. The right ventricular size is normal. There is normal pulmonary artery systolic pressure.  3. The mitral valve is normal in structure. Trivial mitral valve regurgitation. No evidence of mitral stenosis.  4. The aortic valve is tricuspid. Aortic valve regurgitation is mild. No aortic stenosis is present.  5. Aortic dilatation noted. There is mild dilatation of the aortic root, measuring 39 mm.  6. The inferior vena cava is normal in size with greater than 50% respiratory variability, suggesting right atrial pressure of 3 mmHg. Comparison(s): No significant change from prior study. Conclusion(s)/Recommendation(s): Normal biventricular function without evidence of hemodynamically significant valvular heart disease. FINDINGS  Left Ventricle: Left ventricular ejection fraction, by estimation, is 60 to 65%. The left ventricle has normal function. The left  ventricle has no regional wall motion abnormalities. The average left ventricular global longitudinal strain is -22.4 %. The global longitudinal strain is normal. The left ventricular internal cavity size was normal in size. There is mild concentric left ventricular hypertrophy. Left ventricular diastolic parameters are indeterminate. Right Ventricle: The right ventricular size is normal. No increase in right ventricular wall thickness. Right ventricular systolic function is normal. There is normal pulmonary artery systolic pressure. The tricuspid regurgitant velocity is 2.35 m/s, and  with an assumed right atrial pressure of 3 mmHg, the estimated right ventricular systolic pressure is 63.0 mmHg. Left Atrium: Left atrial size was normal in size. Right Atrium: Right atrial size was normal in size. Pericardium: Trivial pericardial effusion is present. Presence of pericardial fat pad. Mitral Valve: The mitral valve is normal in structure. Trivial mitral valve regurgitation. No evidence of mitral valve stenosis. Tricuspid Valve: The tricuspid valve is normal in  structure. Tricuspid valve regurgitation is trivial. No evidence of tricuspid stenosis. Aortic Valve: The aortic valve is tricuspid. Aortic valve regurgitation is mild. Aortic regurgitation PHT measures 650 msec. No aortic stenosis is present. Pulmonic Valve: The pulmonic valve was not well visualized. Pulmonic valve regurgitation is trivial. No evidence of pulmonic stenosis. Aorta: Aortic dilatation noted. There is mild dilatation of the aortic root, measuring 39 mm. Venous: The inferior vena cava is normal in size with greater than 50% respiratory variability, suggesting right atrial pressure of 3 mmHg. IAS/Shunts: No atrial level shunt detected by color flow Doppler.  LEFT VENTRICLE PLAX 2D LVIDd:         4.00 cm     Diastology LVIDs:         2.50 cm     LV e' medial:    7.29 cm/s LV PW:         1.40 cm     LV E/e' medial:  12.9 LV IVS:        1.30 cm     LV e' lateral:   8.70 cm/s LVOT diam:     1.70 cm     LV E/e' lateral: 10.8 LV SV:         38 LV SV Index:   21          2D Longitudinal Strain LVOT Area:     2.27 cm    2D Strain GLS (A2C):   -23.2 %                            2D Strain GLS (A3C):   -23.8 %                            2D Strain GLS (A4C):   -20.2 % LV Volumes (MOD)           2D Strain GLS Avg:     -22.4 % LV vol d, MOD A2C: 58.1 ml LV vol d, MOD A4C: 59.6 ml LV vol s, MOD A2C: 29.1 ml LV vol s, MOD A4C: 25.2 ml LV SV MOD A2C:     29.0 ml LV SV MOD A4C:     59.6 ml LV SV MOD BP:      32.9 ml RIGHT VENTRICLE             IVC RV S prime:     10.40 cm/s  IVC diam: 1.60 cm TAPSE (M-mode): 1.9 cm LEFT ATRIUM  Index       RIGHT ATRIUM           Index LA diam:      3.60 cm 1.95 cm/m  RA Area:     13.00 cm LA Vol (A2C): 28.9 ml 15.62 ml/m RA Volume:   31.00 ml  16.76 ml/m LA Vol (A4C): 28.1 ml 15.19 ml/m  AORTIC VALVE LVOT Vmax:   72.50 cm/s LVOT Vmean:  55.000 cm/s LVOT VTI:    0.168 m AI PHT:      650 msec  AORTA Ao Root diam: 3.30 cm Ao Asc diam:  3.85 cm MITRAL VALVE                TRICUSPID VALVE MV Area (PHT): 4.80 cm    TR Peak grad:   22.1 mmHg MV Decel Time: 158 msec    TR Vmax:        235.00 cm/s MV E velocity: 94.30 cm/s MV A velocity: 74.60 cm/s  SHUNTS MV E/A ratio:  1.26        Systemic VTI:  0.17 m                            Systemic Diam: 1.70 cm Buford Dresser MD Electronically signed by Buford Dresser MD Signature Date/Time: 01/17/2020/12:54:19 PM    Final      ELIGIBLE FOR AVAILABLE RESEARCH PROTOCOL: AET  ASSESSMENT: 70 y.o. [place] woman status post left breast upper outer quadrant biopsy 08/30/2019 for a clinical T1c N0, stage IA invasive ductal carcinoma, grade 2, estrogen and progesterone receptor positive, HER-2 amplified, with an MIB-1 of 20%.  (0) status post TAH/BSO 2014 for a FIGO grade 3, stage IA endometrial carcinoma, status post vaginal brachytherapy, no adjuvant chemotherapy  (1) status post left lumpectomy and axillary sentinel node sampling 10/07/2019 for a pT1c pN1, stage IB invasive ductal carcinoma, grade 2, with negative margins.  (a) a total of 1 sentinel lymph node was removed.  (2) adjuvant chemotherapy to consist of carboplatin, gemcitabine, trastuzumab  (a) baseline echo 09/15/2019 shows an ejection fraction in the 60-65% range.  (b) will not use docetaxel secondary to severe peripheral neuropathy present at baseline  (c) echo on 01/17/2020 shows EF of 60-65%  (3) trastuzumab to be continued to complete a year  (4) adjuvant radiation to follow  (5) antiestrogens to start at the completion of local treatment   PLAN: Danny continues on adjuvant chemotherapy with Gemcitabine, Carboplatin and Trastuzumab.  She is tolerating it well, her labs are stable and she will proceed with treatment today.   We discussed hydration, energy conservation, and staying active during her higher energy days to prevent deconditioning from chemotherapy.  She understands this.  We wills ee See back in 3 weeks for labs, f/u, and  her next cycle of treatment.  She knows to call for any questions that may arise between now and her next appointment.  We are happy to see her sooner if needed.   Total encounter time 20 minutes.Wilber Bihari, NP 01/26/20 10:10 AM Medical Oncology and Hematology [facility] [place], [place] [address] Tel. [phone]    Fax. [phone]    *Total Encounter Time as defined by the Centers for Medicare and Medicaid Services includes, in addition to the face-to-face time of a patient visit (documented in the note above) non-face-to-face time: obtaining and reviewing outside history, ordering and reviewing medications, tests or procedures,  care coordination (communications with other health care professionals or caregivers) and documentation in the medical record.

## 2020-01-26 NOTE — Patient Instructions (Signed)
[facility] Discharge Instructions for Patients Receiving Chemotherapy  Today you received the following chemotherapy agents trastuzumab, gemcitabine, carboplatin.   To help prevent nausea and vomiting after your treatment, we encourage you to take your nausea medication as directed.  If you develop nausea and vomiting that is not controlled by your nausea medication, call the clinic.   BELOW ARE SYMPTOMS THAT SHOULD BE REPORTED IMMEDIATELY:  *FEVER GREATER THAN 100.5 F  *CHILLS WITH OR WITHOUT FEVER  NAUSEA AND VOMITING THAT IS NOT CONTROLLED WITH YOUR NAUSEA MEDICATION  *UNUSUAL SHORTNESS OF BREATH  *UNUSUAL BRUISING OR BLEEDING  TENDERNESS IN MOUTH AND THROAT WITH OR WITHOUT PRESENCE OF ULCERS  *URINARY PROBLEMS  *BOWEL PROBLEMS  UNUSUAL RASH Items with * indicate a potential emergency and should be followed up as soon as possible.  Feel free to call the clinic should you have any questions or concerns. The clinic phone number is [phone].  Please show the CHEMO ALERT CARD at check-in to the Emergency Department and triage nurse.   

## 2020-01-26 NOTE — Progress Notes (Signed)
[facility] CSW Progress Note  Clinical Social Worker met with patient to provide ongoing support. Patient had to change her holiday plans as her daughter tested positive for covid (although was mainly asymptomatic). There have been some stressors at work as well, but patient has advocated for herself and coworkers and this issue is resolving. Otherwise, patient reports to be doing fairly well right now. She is working on applications for assistance.  Patient will be meeting with counseling intern to resume sessions.    Michelle E Zavala , LCSW 

## 2020-01-26 NOTE — Telephone Encounter (Signed)
Called to schedule session for next week 1/19.  Gaylyn Rong Counseling Intern

## 2020-01-26 NOTE — Patient Instructions (Signed)

## 2020-01-27 ENCOUNTER — Telehealth: Payer: Self-pay | Admitting: Adult Health

## 2020-01-27 NOTE — Telephone Encounter (Signed)
No 11/2 los. No changes made to pt's schedule.  

## 2020-01-28 ENCOUNTER — Other Ambulatory Visit: Payer: Self-pay

## 2020-01-28 ENCOUNTER — Inpatient Hospital Stay: Payer: Medicare Other

## 2020-01-28 ENCOUNTER — Encounter: Payer: Self-pay | Admitting: Adult Health

## 2020-01-28 VITALS — BP 115/68 | HR 76 | Resp 18

## 2020-01-28 DIAGNOSIS — Z803 Family history of malignant neoplasm of breast: Secondary | ICD-10-CM | POA: Diagnosis not present

## 2020-01-28 DIAGNOSIS — Z800 Family history of malignant neoplasm of digestive organs: Secondary | ICD-10-CM | POA: Diagnosis not present

## 2020-01-28 DIAGNOSIS — E1042 Type 1 diabetes mellitus with diabetic polyneuropathy: Secondary | ICD-10-CM | POA: Diagnosis not present

## 2020-01-28 DIAGNOSIS — Z79899 Other long term (current) drug therapy: Secondary | ICD-10-CM | POA: Diagnosis not present

## 2020-01-28 DIAGNOSIS — Z8249 Family history of ischemic heart disease and other diseases of the circulatory system: Secondary | ICD-10-CM | POA: Diagnosis not present

## 2020-01-28 DIAGNOSIS — I340 Nonrheumatic mitral (valve) insufficiency: Secondary | ICD-10-CM | POA: Diagnosis not present

## 2020-01-28 DIAGNOSIS — M1711 Unilateral primary osteoarthritis, right knee: Secondary | ICD-10-CM | POA: Diagnosis not present

## 2020-01-28 DIAGNOSIS — Z5111 Encounter for antineoplastic chemotherapy: Secondary | ICD-10-CM | POA: Diagnosis not present

## 2020-01-28 DIAGNOSIS — E785 Hyperlipidemia, unspecified: Secondary | ICD-10-CM | POA: Diagnosis not present

## 2020-01-28 DIAGNOSIS — G629 Polyneuropathy, unspecified: Secondary | ICD-10-CM | POA: Diagnosis not present

## 2020-01-28 DIAGNOSIS — I10 Essential (primary) hypertension: Secondary | ICD-10-CM | POA: Diagnosis not present

## 2020-01-28 DIAGNOSIS — Z5112 Encounter for antineoplastic immunotherapy: Secondary | ICD-10-CM | POA: Diagnosis not present

## 2020-01-28 DIAGNOSIS — I351 Nonrheumatic aortic (valve) insufficiency: Secondary | ICD-10-CM | POA: Diagnosis not present

## 2020-01-28 DIAGNOSIS — Z833 Family history of diabetes mellitus: Secondary | ICD-10-CM | POA: Diagnosis not present

## 2020-01-28 DIAGNOSIS — I2510 Atherosclerotic heart disease of native coronary artery without angina pectoris: Secondary | ICD-10-CM | POA: Diagnosis not present

## 2020-01-28 DIAGNOSIS — C50412 Malignant neoplasm of upper-outer quadrant of left female breast: Secondary | ICD-10-CM | POA: Diagnosis not present

## 2020-01-28 MED ORDER — PEGFILGRASTIM-BMEZ 6 MG/0.6ML ~~LOC~~ SOSY
6.0000 mg | PREFILLED_SYRINGE | Freq: Once | SUBCUTANEOUS | Status: AC
Start: 2020-01-28 — End: 2020-01-28
  Administered 2020-01-28: 6 mg via SUBCUTANEOUS

## 2020-01-28 MED ORDER — PEGFILGRASTIM-BMEZ 6 MG/0.6ML ~~LOC~~ SOSY
PREFILLED_SYRINGE | SUBCUTANEOUS | Status: AC
  Filled 2020-01-28: qty 0.6

## 2020-01-28 NOTE — Patient Instructions (Signed)
Pegfilgrastim injection What is this medicine? PEGFILGRASTIM (PEG fil gra stim) is a long-acting granulocyte colony-stimulating factor that stimulates the growth of neutrophils, a type of white blood cell important in the body's fight against infection. It is used to reduce the incidence of fever and infection in patients with certain types of cancer who are receiving chemotherapy that affects the bone marrow, and to increase survival after being exposed to high doses of radiation. This medicine may be used for other purposes; ask your health care provider or pharmacist if you have questions. COMMON BRAND NAME(S): Fulphila, Neulasta, Nyvepria, UDENYCA, Ziextenzo What should I tell my health care provider before I take this medicine? They need to know if you have any of these conditions:  kidney disease  latex allergy  ongoing radiation therapy  sickle cell disease  skin reactions to acrylic adhesives (On-Body Injector only)  an unusual or allergic reaction to pegfilgrastim, filgrastim, other medicines, foods, dyes, or preservatives  pregnant or trying to get pregnant  breast-feeding How should I use this medicine? This medicine is for injection under the skin. If you get this medicine at home, you will be taught how to prepare and give the pre-filled syringe or how to use the On-body Injector. Refer to the patient Instructions for Use for detailed instructions. Use exactly as directed. Tell your healthcare provider immediately if you suspect that the On-body Injector may not have performed as intended or if you suspect the use of the On-body Injector resulted in a missed or partial dose. It is important that you put your used needles and syringes in a special sharps container. Do not put them in a trash can. If you do not have a sharps container, call your pharmacist or healthcare provider to get one. Talk to your pediatrician regarding the use of this medicine in children. While this drug  may be prescribed for selected conditions, precautions do apply. Overdosage: If you think you have taken too much of this medicine contact a poison control center or emergency room at once. NOTE: This medicine is only for you. Do not share this medicine with others. What if I miss a dose? It is important not to miss your dose. Call your doctor or health care professional if you miss your dose. If you miss a dose due to an On-body Injector failure or leakage, a new dose should be administered as soon as possible using a single prefilled syringe for manual use. What may interact with this medicine? Interactions have not been studied. This list may not describe all possible interactions. Give your health care provider a list of all the medicines, herbs, non-prescription drugs, or dietary supplements you use. Also tell them if you smoke, drink alcohol, or use illegal drugs. Some items may interact with your medicine. What should I watch for while using this medicine? Your condition will be monitored carefully while you are receiving this medicine. You may need blood work done while you are taking this medicine. Talk to your health care provider about your risk of cancer. You may be more at risk for certain types of cancer if you take this medicine. If you are going to need a MRI, CT scan, or other procedure, tell your doctor that you are using this medicine (On-Body Injector only). What side effects may I notice from receiving this medicine? Side effects that you should report to your doctor or health care professional as soon as possible:  allergic reactions (skin rash, itching or hives, swelling of   the face, lips, or tongue)  back pain  dizziness  fever  pain, redness, or irritation at site where injected  pinpoint red spots on the skin  red or dark-brown urine  shortness of breath or breathing problems  stomach or side pain, or pain at the shoulder  swelling  tiredness  trouble  passing urine or change in the amount of urine  unusual bruising or bleeding Side effects that usually do not require medical attention (report to your doctor or health care professional if they continue or are bothersome):  bone pain  muscle pain This list may not describe all possible side effects. Call your doctor for medical advice about side effects. You may report side effects to FDA at 1-800-FDA-1088. Where should I keep my medicine? Keep out of the reach of children. If you are using this medicine at home, you will be instructed on how to store it. Throw away any unused medicine after the expiration date on the label. NOTE: This sheet is a summary. It may not cover all possible information. If you have questions about this medicine, talk to your doctor, pharmacist, or health care provider.  2021 [employer] (2019-01-22 13:20:51)  

## 2020-01-31 ENCOUNTER — Ambulatory Visit: Payer: Medicare Other

## 2020-02-02 ENCOUNTER — Telehealth: Payer: Self-pay | Admitting: *Deleted

## 2020-02-02 ENCOUNTER — Encounter: Payer: Self-pay | Admitting: General Practice

## 2020-02-02 ENCOUNTER — Other Ambulatory Visit: Payer: Self-pay | Admitting: Oncology

## 2020-02-02 NOTE — Telephone Encounter (Addendum)
Received vm call form pt stating that she had a rough time with treatment last Wednesday & is seriously considering not doing last two treatments. Called pt & she states she did well with 1st 2 treatments & 3rd she felt bad but this was the worst.  She states she has a tendency to have restless legs but she couldn't sit still & had to get up & walk.  She states she usually sleeps but couldn't rest.  She had upset stomach going home.  She felt some better yesterday but today has had 3 diarrhea stools & has a h/a.  She reports having h/a b/c of neck problem.  Discussed zofran may cause h/a & she will try compazine for nausea which she has but no vomiting & see if h/a is better.  She just took some imodium.  Encouraged to take imodium as needed & to push fluids.  She has had some caffeine today.  Encouraged to back off of caffeine but to push gatorade & other fluids to help with hydration. She is eating & has an appetite.  She has also taken a muscle relaxer for her neck problem.  Informed that this message will be relayed to Dr Jana Hakim to see if he has other suggestions & to see how he feels about not doing next two treatments.   Discussed with Dr Jana Hakim & he sent schedule message for pt to return 02/10/20 @ 4 pm for labs/MD & will discuss above.  He also asked if she wanted some IVF.  Left message for pt to return call I am.

## 2020-02-02 NOTE — Progress Notes (Signed)
[facility] Note  Left voicemail on behalf of Counseling Intern Gaylyn Rong to notify Ms Saperstein that Raquel Sarna will phone ASAP to reschedule their noon appointment.   [place], [place], [facility] Pager [phone] Voicemail [phone]

## 2020-02-02 NOTE — Telephone Encounter (Signed)
Pt left vm stating "the last treatment did me in and I might want to stop early". Return pt call, had to leave vm. Informed pt that Dr. Doris Cheadle has been notified and that we would still like her to come in as planned on 2/2 to further discuss with Dr. Doris Cheadle. Contact information provided for further questions or needs.

## 2020-02-03 ENCOUNTER — Telehealth: Payer: Self-pay | Admitting: Oncology

## 2020-02-03 ENCOUNTER — Telehealth: Payer: Self-pay

## 2020-02-03 NOTE — Telephone Encounter (Signed)
Left message with follow-up appointment per 1/19 schedule message. Gave option to call back to reschedule if needed.

## 2020-02-03 NOTE — Telephone Encounter (Signed)
Left VM to talk about rescheduling our session.   Gaylyn Rong Counseling Intern

## 2020-02-07 ENCOUNTER — Ambulatory Visit: Payer: Medicare Other

## 2020-02-07 ENCOUNTER — Telehealth: Payer: Self-pay | Admitting: *Deleted

## 2020-02-07 DIAGNOSIS — S9001XA Contusion of right ankle, initial encounter: Secondary | ICD-10-CM | POA: Diagnosis not present

## 2020-02-07 NOTE — Telephone Encounter (Signed)
This RN returned call to the patient's VM stating issues of ongoing diarrhea - now having hemorrhoids. She states she has also " hurt my ankle but I am going to the walk in clinic about it "  Obtained pt's VM- message left to return call.

## 2020-02-09 ENCOUNTER — Telehealth: Payer: Self-pay | Admitting: Adult Health

## 2020-02-09 NOTE — Progress Notes (Signed)
Felicia [name]  [phone] [phone]     ID: Felicia [name] DOB: 12/14/1950  MR#: [id number]  VOJ#:[id number]  Patient Care Team: Felicia Lass, MD as PCP - General (Family Medicine) Nahser, [name], MD as PCP - Cardiology (Cardiology) Felicia Kaufmann, RN as Oncology Nurse Navigator Felicia Germany, RN as Oncology Nurse Navigator Felicia Bookbinder, MD as Consulting Physician (General Surgery) Felicia [name], Felicia Dad, MD as Consulting Physician (Oncology) Felicia Gibson, MD as Attending Physician (Radiation Oncology) Felicia Pound, MD as Consulting Physician (Rheumatology) Felicia Cover, MD as Consulting Physician (Orthopedic Surgery) Felicia Pupa, DO as Consulting Physician (Obstetrics and Gynecology) Felicia Dresser, MD as Consulting Physician (Cardiology) Felicia Rear, MD as Consulting Physician (Endocrinology) Felicia Cruel, MD OTHER MD:  CHIEF COMPLAINT: Triple positive invasive breast cancer  CURRENT TREATMENT: T-DM1   INTERVAL HISTORY: Felicia [name] returns today for follow up and treatment of her triple positive invasive breast cancer.    She began adjuvant chemotherapy, consisting of carboplatin, gemcitabine, trastuzumab, on 11/24/2019.  She received her fourth of 6 planned cycles on 01/26/2020.  To review, her treatment has been tailored to her comorbidities.  We were not able to give her docetaxel because of neuropathy and diabetes issues.    Her most recent echocardiogram from 01/17/2020 showed a normal EF of 60-65%.   REVIEW OF SYSTEMS: [name] tolerated the most recent cycle poorly.  She had significant problems with "restless legs" during the treatment.  She had significant diarrhea.  She somehow managed to injure her right foot by flexing it--she is not aware of any trauma to it.  She is extremely fatigued.  Overall she does not think she can undergo any more of this chemotherapy.  She tells me incidentally that her daughter has  Covid 73.  She is doing moderately well with that at present   [place]: Refuses vaccination   HISTORY OF CURRENT ILLNESS: From the original intake note:  Felicia [name] had routine screening mammography on 08/03/2019 showing a possible abnormality in the left breast. She underwent left diagnostic mammography with tomography and left breast ultrasonography at [facility] on 08/17/2019 showing: breast density category B; 1.6 cm upper-outer left breast mass; no significant left axillary abnormalities.  Accordingly on 08/30/2019 she proceeded to biopsy of the left breast area in question. The pathology from this procedure ([id number]) showed: invasive mammary carcinoma, e-cadherin positive, grade 2. Prognostic indicators significant for: estrogen receptor, 100% positive and progesterone receptor, 40% positive, both with strong staining intensity. Proliferation marker Ki67 at 20%. HER2 equivocal by immunohistochemistry (2+), but negative by fluorescent in situ hybridization with a signals ratio   and number per cell  .  Of note, she also has a history of FIGO grade 3, stage IA endometrial carcinoma, diagnosed in 2014 in [place]. She underwent robotic assisted total hysterectomy with BSO and pelvic and perirectal aortic lymph node dissection as well as omental biopsy.  Tumor was found only in the endometrium arising in a polyp measuring 1.7 cm.  There was no myometrial invasion no lymphovascular space invasion, and no lower uterine segment involvement.  The cervix and adnexa, lymph nodes and omentum were all not involved.  She then received vaginal brachytherapy, no chemotherapy, completing treatment in the springtime of 2015. [Per patient's report, chemotherapy was suggested, but she opted against this.]  The patient's subsequent history is as detailed below.   PAST MEDICAL HISTORY: Past Medical History:  Diagnosis Date  . Anemia   . Anxiety   .  Breast cancer ([place]) 09/2019   left  breast IMC  . CAD (coronary artery disease)    a. 3/2007s/p DES to the LAD [facility]); b. 01/2017 MV: EF 80%, small, mild apical ant defect w/o ischemia (felt to be breast atten). Low risk.  . Depression   . DM (diabetes mellitus) ([place])   . Essential hypertension   . GERD (gastroesophageal reflux disease)   . Headache    secondary to neck surgery per patient  . High triglycerides   . Hyperlipidemia   . Hypothyroid   . OA (osteoarthritis)    right knee,hands  . Overflow incontinence   . PONV (postoperative nausea and vomiting)    s/p gallbladder   . RLS (restless legs syndrome)   . Urinary urgency   . Uterine cancer ([place]) dx'd 2014    PAST SURGICAL HISTORY: Past Surgical History:  Procedure Laterality Date  . BREAST LUMPECTOMY WITH RADIOACTIVE SEED AND SENTINEL LYMPH NODE BIOPSY Left 10/07/2019   Procedure: LEFT BREAST LUMPECTOMY WITH RADIOACTIVE SEED AND SENTINEL LYMPH NODE BIOPSY;  Surgeon: Felicia Bookbinder, MD;  Location: [place];  Service: General;  Laterality: Left;  . CORONARY STENT PLACEMENT    . CORONARY/GRAFT ACUTE MI REVASCULARIZATION    . FINGER ARTHRODESIS Right 06/11/2019   Procedure: ARTHRODESIS INDEX FINGER DISTAL PHALANGEAL JOINT;  Surgeon: Felicia Cover, MD;  Location: [place];  Service: Orthopedics;  Laterality: Right;  . FOOT SURGERY Left   . LEFT HEART CATH AND CORONARY ANGIOGRAPHY N/A 02/16/2018   Procedure: LEFT HEART CATH AND CORONARY ANGIOGRAPHY;  Surgeon: Lorretta Harp, MD;  Location: [place] [facility];  Service: Cardiovascular;  Laterality: N/A;  . PORTACATH PLACEMENT Right 11/23/2019   Procedure: INSERTION PORT-A-CATH WITH ULTRASOUND GUIDANCE;  Surgeon: Felicia Bookbinder, MD;  Location: [place];  Service: General;  Laterality: Right;  . REPLACEMENT TOTAL KNEE Right   . SPINE SURGERY    . TOTAL ABDOMINAL HYSTERECTOMY     FOR UTERUS CANCER  . TOTAL HIP ARTHROPLASTY Left     FAMILY  HISTORY: Family History  Problem Relation Age of Onset  . AAA (abdominal aortic aneurysm) Mother   . Diabetes Father   . Alzheimer's disease Father   . Throat cancer Sister   . Breast cancer Cousin    Her father died at age 21 from Alzheimer's. Her mother died at age 58 from a ruptured abdominal aneurysm. Felicia [name] has 2 brothers and 3 sisters. She reports breast cancer in a paternal cousin. She also notes throat cancer in her sister.   GYNECOLOGIC HISTORY:  No LMP recorded. Patient has had a hysterectomy. Menarche: 70 years old Age at first live birth: 70 years old [place] P 2 LMP 1996 Contraceptive: prior use without issue HRT never used  Hysterectomy? Yes, 2014 BSO? 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  She unpacks 40 pounds boxes of chicken, has to cook them in 13 [name] cooking pans, and is generally on her feet at work but does not otherwise exercise.  She is divorced. She lives at home by herself, with no pets. Daughter Pershing Proud, age 71, is a radiology tech in [place]. Daughter Martyn Malay, age 31, is an Optometrist in [place], [place]. Shaunee has one grandchild. She is not a Ambulance person.    ADVANCED DIRECTIVES: Not in place; intends to name daughter [name] as her HCPOA.   HEALTH MAINTENANCE: Social History   Tobacco  Use  . Smoking status: Never Smoker  . Smokeless tobacco: Never Used  Vaping Use  . Vaping Use: Never used  Substance Use Topics  . Alcohol use: Yes    Comment: OCCASIONALLY  . Drug use: No     Colonoscopy: <10 years ago, in [place]  PAP: 2014?  Bone density: date unsure, also in [place]   Allergies  Allergen Reactions  . Chloraprep One Step [Chlorhexidine Gluconate] Rash  . Estrogens Other (See Comments)    PATIENT HAS A HISTORY OF CANCER AND HAS BEEN TOLD TO NEVER TAKE ANYTHING CONTAINING ESTROGEN, AS IT MIGHT CAUSE A RECURRENCE  . Shrimp [Shellfish Allergy] Nausea And Vomiting    Current  Outpatient Medications  Medication Sig Dispense Refill  . ACCU-CHEK GUIDE test strip 1 each by Other route as needed.    . Ascorbic Acid (VITAMIN C PO) Take 1 tablet by mouth daily.    Marland Kitchen aspirin EC 81 MG tablet Take 81 mg by mouth daily.    Marland Kitchen atorvastatin (LIPITOR) 40 MG tablet TAKE 1 TABLET BY MOUTH EVERY DAY 90 tablet 1  . bisoprolol (ZEBETA) 5 MG tablet TAKE 1 TABLET BY MOUTH EVERY DAY 90 tablet 2  . Calcium Citrate-Vitamin D (CALCIUM + D PO) Take 1 tablet by mouth daily with supper.    Marland Kitchen CINNAMON PO Take 1 capsule by mouth daily with supper.    . diclofenac sodium (VOLTAREN) 1 % GEL Apply 2-4 g topically 4 (four) times daily as needed (as directed for pain).    Marland Kitchen diclofenac Sodium (VOLTAREN) 1 % GEL Apply 1 application topically 3 (three) times daily as needed.    . Dulaglutide (TRULICITY) 1.5 AV/4.0JW SOPN Inject 1.5 mg into the skin once a week.    . escitalopram (LEXAPRO) 10 MG tablet Take 10 mg by mouth daily.    . Ferrous Sulfate (IRON PO) Take 1 tablet by mouth daily. alternates days:1 tablet one day and 2 tablets the next day    . ferrous sulfate 325 (65 FE) MG EC tablet Take 325 mg by mouth 3 (three) times daily with meals.    Marland Kitchen GLIPIZIDE XL 10 MG 24 hr tablet Take 20 mg by mouth daily.   3  . JARDIANCE 25 MG TABS tablet Take 25 mg by mouth every morning.   3  . levothyroxine (SYNTHROID, LEVOTHROID) 125 MCG tablet Take 125 mcg by mouth daily before breakfast.    . lidocaine-prilocaine (EMLA) cream Apply to affected area once 30 g 3  . metFORMIN (GLUCOPHAGE) 500 MG tablet Take 500 mg by mouth 2 (two) times daily with a meal.    . metroNIDAZOLE (METROGEL) 1 % gel Apply 1 application topically as needed (as directed to affected area).     . Multiple Vitamins-Calcium (ONE-A-DAY WOMENS PO) Take 1 tablet by mouth daily.    . nitroGLYCERIN (NITROSTAT) 0.4 MG SL tablet Place 1 tablet (0.4 mg total) under the tongue every 5 (five) minutes as needed for chest pain. 25 tablet 6  . omeprazole  (PRILOSEC) 40 MG capsule Take 40 mg by mouth daily with supper.     . ondansetron (ZOFRAN) 8 MG tablet Take 1 tablet (8 mg total) by mouth 2 (two) times daily as needed for refractory nausea / vomiting. Start on day 3 after chemo. 30 tablet 1  . prochlorperazine (COMPAZINE) 10 MG tablet Take 1 tablet by mouth before meals and at bedtime on days 2 and 3 after chemotherapy (counting chemotherapy day as day 1);  after that may take as needed 30 tablet 1  . rOPINIRole (REQUIP) 0.5 MG tablet Take 0.5 mg by mouth at bedtime. 1-3 hours prior to bedtime    . tiZANidine (ZANAFLEX) 4 MG tablet Take 4 mg by mouth 3 (three) times daily. As needed    . TURMERIC PO Take 1 capsule by mouth daily with supper.     No current facility-administered medications for this visit.    OBJECTIVE: White woman who appears stated age  52:   02/10/20 1551  BP: 119/70  Pulse: 66  Resp: 18  Temp: (!) 97.3 F (36.3 C)  SpO2: 98%     Body mass index is 30.11 kg/m.   Wt Readings from Last 3 Encounters:  02/10/20 175 lb 6.4 oz (79.6 kg)  01/26/20 172 lb (78 kg)  01/04/20 175 lb 4.8 oz (79.5 kg)      ECOG FS:1 - Symptomatic but completely ambulatory  Sclerae unicteric, EOMs intact Wearing a mask No cervical or supraclavicular adenopathy Lungs no rales or rhonchi Heart regular rate and rhythm Abd soft, nontender, positive bowel sounds MSK no focal spinal tenderness, no upper extremity lymphedema Neuro: nonfocal, well oriented, appropriate affect Breasts: The right breast is status post lumpectomy.  The cosmetic result is good.  There is no residual or recurrent disease.  The left breast is benign.  Both axillae are benign.   LAB RESULTS:  CMP     Component Value Date/Time   NA 137 02/10/2020 1539   NA 139 11/25/2018 1053   K 4.3 02/10/2020 1539   CL 104 02/10/2020 1539   CO2 24 02/10/2020 1539   GLUCOSE 177 (H) 02/10/2020 1539   BUN 9 02/10/2020 1539   BUN 18 11/25/2018 1053   CREATININE 0.76  02/10/2020 1539   CALCIUM 9.6 02/10/2020 1539   PROT 7.2 02/10/2020 1539   PROT 7.1 11/25/2018 1053   ALBUMIN 4.1 02/10/2020 1539   ALBUMIN 4.2 11/25/2018 1053   AST 25 02/10/2020 1539   ALT 44 02/10/2020 1539   ALKPHOS 134 (H) 02/10/2020 1539   BILITOT 0.2 (L) 02/10/2020 1539   GFRNONAA >60 02/10/2020 1539   GFRAA >60 10/04/2019 1330   GFRAA >60 09/08/2019 0811    No results found for: TOTALPROTELP, ALBUMINELP, A1GS, A2GS, BETS, BETA2SER, GAMS, MSPIKE, SPEI  Lab Results  Component Value Date   WBC 25.2 (H) 02/10/2020   NEUTROABS 10.9 (H) 02/10/2020   HGB 12.4 02/10/2020   HCT 38.7 02/10/2020   MCV 104.3 (H) 02/10/2020   PLT 127 (L) 02/10/2020    No results found for: LABCA2  No components found for: MGNOIB704  No results for input(s): INR in the last 168 hours.  No results found for: LABCA2  No results found for: UGQ916  No results found for: XIH038  No results found for: UEK800  No results found for: CA2729  No components found for: HGQUANT  No results found for: CEA1 / No results found for: CEA1   No results found for: AFPTUMOR  No results found for: CHROMOGRNA  No results found for: KPAFRELGTCHN, LAMBDASER, KAPLAMBRATIO (kappa/lambda light chains)  No results found for: HGBA, HGBA2QUANT, HGBFQUANT, HGBSQUAN (Hemoglobinopathy evaluation)   No results found for: LDH  No results found for: IRON, TIBC, IRONPCTSAT (Iron and TIBC)  No results found for: FERRITIN  Urinalysis No results found for: COLORURINE, APPEARANCEUR, LABSPEC, PHURINE, GLUCOSEU, HGBUR, BILIRUBINUR, KETONESUR, PROTEINUR, UROBILINOGEN, NITRITE, LEUKOCYTESUR   STUDIES: ECHOCARDIOGRAM COMPLETE  Result Date: 01/17/2020    ECHOCARDIOGRAM  REPORT   Patient Name:   JERELENE SALAAM Date of Exam: 01/17/2020 Medical Rec #:  [id number]     Height:       64.0 in Accession #:    [id number]    Weight:       175.3 lb Date of Birth:  1950-05-15    BSA:          1.850 m Patient Age:    3 years       BP:           115/67 mmHg Patient Gender: F             HR:           64 bpm. Exam Location:  Outpatient Procedure: 2D Echo, 3D Echo, Cardiac Doppler, Color Doppler and Strain Analysis Indications:    Z51.11 Encounter for antineoplastic chemotheraphy  History:        Patient has prior history of Echocardiogram examinations, most                 recent 09/15/2019. CAD, Signs/Symptoms:Chest Pain and                 Dizziness/Lightheadedness; Risk Factors:Diabetes and                 Dyslipidemia. Metastatic breast cancer.  Sonographer:    Roseanna Rainbow Referring Phys: [place]  1. Left ventricular ejection fraction, by estimation, is 60 to 65%. The left ventricle has normal function. The left ventricle has no regional wall motion abnormalities. There is mild concentric left ventricular hypertrophy. Left ventricular diastolic parameters are indeterminate. The average left ventricular global longitudinal strain is -22.4 %. The global longitudinal strain is normal.  2. Right ventricular systolic function is normal. The right ventricular size is normal. There is normal pulmonary artery systolic pressure.  3. The mitral valve is normal in structure. Trivial mitral valve regurgitation. No evidence of mitral stenosis.  4. The aortic valve is tricuspid. Aortic valve regurgitation is mild. No aortic stenosis is present.  5. Aortic dilatation noted. There is mild dilatation of the aortic root, measuring 39 mm.  6. The inferior vena cava is normal in size with greater than 50% respiratory variability, suggesting right atrial pressure of 3 mmHg. Comparison(s): No significant change from prior study. Conclusion(s)/Recommendation(s): Normal biventricular function without evidence of hemodynamically significant valvular heart disease. FINDINGS  Left Ventricle: Left ventricular ejection fraction, by estimation, is 60 to 65%. The left ventricle has normal function. The left ventricle has no regional wall motion  abnormalities. The average left ventricular global longitudinal strain is -22.4 %. The global longitudinal strain is normal. The left ventricular internal cavity size was normal in size. There is mild concentric left ventricular hypertrophy. Left ventricular diastolic parameters are indeterminate. Right Ventricle: The right ventricular size is normal. No increase in right ventricular wall thickness. Right ventricular systolic function is normal. There is normal pulmonary artery systolic pressure. The tricuspid regurgitant velocity is 2.35 m/s, and  with an assumed right atrial pressure of 3 mmHg, the estimated right ventricular systolic pressure is 81.2 mmHg. Left Atrium: Left atrial size was normal in size. Right Atrium: Right atrial size was normal in size. Pericardium: Trivial pericardial effusion is present. Presence of pericardial fat pad. Mitral Valve: The mitral valve is normal in structure. Trivial mitral valve regurgitation. No evidence of mitral valve stenosis. Tricuspid Valve: The tricuspid valve is normal in structure. Tricuspid valve regurgitation is trivial. No evidence of  tricuspid stenosis. Aortic Valve: The aortic valve is tricuspid. Aortic valve regurgitation is mild. Aortic regurgitation PHT measures 650 msec. No aortic stenosis is present. Pulmonic Valve: The pulmonic valve was not well visualized. Pulmonic valve regurgitation is trivial. No evidence of pulmonic stenosis. Aorta: Aortic dilatation noted. There is mild dilatation of the aortic root, measuring 39 mm. Venous: The inferior vena cava is normal in size with greater than 50% respiratory variability, suggesting right atrial pressure of 3 mmHg. IAS/Shunts: No atrial level shunt detected by color flow Doppler.  LEFT VENTRICLE PLAX 2D LVIDd:         4.00 cm     Diastology LVIDs:         2.50 cm     LV e' medial:    7.29 cm/s LV PW:         1.40 cm     LV E/e' medial:  12.9 LV IVS:        1.30 cm     LV e' lateral:   8.70 cm/s LVOT diam:      1.70 cm     LV E/e' lateral: 10.8 LV SV:         38 LV SV Index:   21          2D Longitudinal Strain LVOT Area:     2.27 cm    2D Strain GLS (A2C):   -23.2 %                            2D Strain GLS (A3C):   -23.8 %                            2D Strain GLS (A4C):   -20.2 % LV Volumes (MOD)           2D Strain GLS Avg:     -22.4 % LV vol d, MOD A2C: 58.1 ml LV vol d, MOD A4C: 59.6 ml LV vol s, MOD A2C: 29.1 ml LV vol s, MOD A4C: 25.2 ml LV SV MOD A2C:     29.0 ml LV SV MOD A4C:     59.6 ml LV SV MOD BP:      32.9 ml RIGHT VENTRICLE             IVC RV S prime:     10.40 cm/s  IVC diam: 1.60 cm TAPSE (M-mode): 1.9 cm LEFT ATRIUM           Index       RIGHT ATRIUM           Index LA diam:      3.60 cm 1.95 cm/m  RA Area:     13.00 cm LA Vol (A2C): 28.9 ml 15.62 ml/m RA Volume:   31.00 ml  16.76 ml/m LA Vol (A4C): 28.1 ml 15.19 ml/m  AORTIC VALVE LVOT Vmax:   72.50 cm/s LVOT Vmean:  55.000 cm/s LVOT VTI:    0.168 m AI PHT:      650 msec  AORTA Ao Root diam: 3.30 cm Ao Asc diam:  3.85 cm MITRAL VALVE               TRICUSPID VALVE MV Area (PHT): 4.80 cm    TR Peak grad:   22.1 mmHg MV Decel Time: 158 msec    TR Vmax:        235.00 cm/s MV E  velocity: 94.30 cm/s MV A velocity: 74.60 cm/s  SHUNTS MV E/A ratio:  1.26        Systemic VTI:  0.17 m                            Systemic Diam: 1.70 cm Buford Dresser MD Electronically signed by Buford Dresser MD Signature Date/Time: 01/17/2020/12:54:19 PM    Final      ELIGIBLE FOR AVAILABLE RESEARCH PROTOCOL: AET  ASSESSMENT: 70 y.o. [place] woman status post left breast upper outer quadrant biopsy 08/30/2019 for a clinical T1c N0, stage IA invasive ductal carcinoma, grade 2, estrogen and progesterone receptor positive, HER-2 amplified, with an MIB-1 of 20%.  (0) status post TAH/BSO 2014 for a FIGO grade 3, stage IA endometrial carcinoma, status post vaginal brachytherapy, no adjuvant chemotherapy  (1) status post left lumpectomy and axillary  sentinel node sampling 10/07/2019 for a pT1c pN1, stage IB invasive ductal carcinoma, grade 2, with negative margins.  (a) a total of 1 sentinel lymph node was removed.  (2) adjuvant chemotherapy consisting of carboplatin, gemcitabine, trastuzumab given every 3 weeks x 6 started 11/24/2019  (a) baseline echo 09/15/2019 shows an ejection fraction in the 60-65% range.  (b) will not use docetaxel secondary to severe peripheral neuropathy present at baseline  (c) echo on 01/17/2020 shows EF of 60-65%  (d) chemotherapy discontinued after 4 cycles with poor tolerance.  (3) T-DM1 started 02/16/2020, to be continued through November 2022  (4) adjuvant radiation to follow  (5) antiestrogens to start at the completion of local treatment   PLAN: Mazal had progressive problems with her chemotherapy and finally after 4 cycles she really does not think she can receive the fifth much less 1/6.  We discussed alternatives.  I think we could switch her to T-DM1.  This is a very good track record in the setting.  We would use it instead of Herceptin and continue it through November 2022 which would be a total year of anti-HER-2 treatment.  We did discuss the possible toxicities side effects and complications of this agent.  We planning to start it with her next schedule treatment which is 02/16/2020.  She will see [place] with a second cycle, 3 weeks later, to assess tolerance.  If she tolerates this well the next step will be to proceed to radiation  Total encounter time 35 minutes.Sarajane Jews C. Kristelle Cavallaro, MD 02/10/20 5:52 PM Medical Oncology and Hematology [facility] [place], [place] [address] Tel. [phone]    Fax. [phone]   I, Wilburn Mylar, am acting as scribe for Dr. Virgie [name]. Dianelys Scinto.  I, Lurline Del MD, have reviewed the above documentation for accuracy and completeness, and I agree with the above.    *Total Encounter Time as defined by the  Centers for Medicare and Medicaid Services includes, in addition to the face-to-face time of a patient visit (documented in the note above) non-face-to-face time: obtaining and reviewing outside history, ordering and reviewing medications, tests or procedures, care coordination (communications with other health care professionals or caregivers) and documentation in the medical record.

## 2020-02-09 NOTE — Telephone Encounter (Signed)
Rescheduled provider visit per 1/21 email/LC schedule change. Pt to get updated appt calendar at next visit with earlier arrival time per appt notes.

## 2020-02-10 ENCOUNTER — Other Ambulatory Visit: Payer: Self-pay

## 2020-02-10 ENCOUNTER — Inpatient Hospital Stay (HOSPITAL_BASED_OUTPATIENT_CLINIC_OR_DEPARTMENT_OTHER): Payer: Medicare Other | Admitting: Oncology

## 2020-02-10 ENCOUNTER — Inpatient Hospital Stay: Payer: Medicare Other

## 2020-02-10 VITALS — BP 119/70 | HR 66 | Temp 97.3°F | Resp 18 | Ht 64.0 in | Wt 175.4 lb

## 2020-02-10 DIAGNOSIS — C50412 Malignant neoplasm of upper-outer quadrant of left female breast: Secondary | ICD-10-CM | POA: Diagnosis not present

## 2020-02-10 DIAGNOSIS — K7460 Unspecified cirrhosis of liver: Secondary | ICD-10-CM | POA: Diagnosis not present

## 2020-02-10 DIAGNOSIS — Z95828 Presence of other vascular implants and grafts: Secondary | ICD-10-CM | POA: Diagnosis not present

## 2020-02-10 DIAGNOSIS — I10 Essential (primary) hypertension: Secondary | ICD-10-CM | POA: Diagnosis not present

## 2020-02-10 DIAGNOSIS — E119 Type 2 diabetes mellitus without complications: Secondary | ICD-10-CM

## 2020-02-10 DIAGNOSIS — I2510 Atherosclerotic heart disease of native coronary artery without angina pectoris: Secondary | ICD-10-CM | POA: Diagnosis not present

## 2020-02-10 DIAGNOSIS — C541 Malignant neoplasm of endometrium: Secondary | ICD-10-CM

## 2020-02-10 DIAGNOSIS — Z833 Family history of diabetes mellitus: Secondary | ICD-10-CM | POA: Diagnosis not present

## 2020-02-10 DIAGNOSIS — E785 Hyperlipidemia, unspecified: Secondary | ICD-10-CM

## 2020-02-10 DIAGNOSIS — Z5111 Encounter for antineoplastic chemotherapy: Secondary | ICD-10-CM | POA: Diagnosis not present

## 2020-02-10 DIAGNOSIS — I351 Nonrheumatic aortic (valve) insufficiency: Secondary | ICD-10-CM | POA: Diagnosis not present

## 2020-02-10 DIAGNOSIS — Z170 Estrogen receptor positive status [ER+]: Secondary | ICD-10-CM

## 2020-02-10 DIAGNOSIS — G629 Polyneuropathy, unspecified: Secondary | ICD-10-CM | POA: Diagnosis not present

## 2020-02-10 DIAGNOSIS — E1042 Type 1 diabetes mellitus with diabetic polyneuropathy: Secondary | ICD-10-CM | POA: Diagnosis not present

## 2020-02-10 DIAGNOSIS — Z8249 Family history of ischemic heart disease and other diseases of the circulatory system: Secondary | ICD-10-CM | POA: Diagnosis not present

## 2020-02-10 DIAGNOSIS — Z5112 Encounter for antineoplastic immunotherapy: Secondary | ICD-10-CM | POA: Diagnosis not present

## 2020-02-10 DIAGNOSIS — Z800 Family history of malignant neoplasm of digestive organs: Secondary | ICD-10-CM | POA: Diagnosis not present

## 2020-02-10 DIAGNOSIS — I340 Nonrheumatic mitral (valve) insufficiency: Secondary | ICD-10-CM | POA: Diagnosis not present

## 2020-02-10 DIAGNOSIS — Z803 Family history of malignant neoplasm of breast: Secondary | ICD-10-CM | POA: Diagnosis not present

## 2020-02-10 DIAGNOSIS — Z79899 Other long term (current) drug therapy: Secondary | ICD-10-CM | POA: Diagnosis not present

## 2020-02-10 DIAGNOSIS — M1711 Unilateral primary osteoarthritis, right knee: Secondary | ICD-10-CM | POA: Diagnosis not present

## 2020-02-10 LAB — CBC WITH DIFFERENTIAL (CANCER CENTER ONLY)
Abs Immature Granulocytes: 0.15 10*3/uL — ABNORMAL HIGH (ref 0.00–0.07)
Basophils Absolute: 0.1 10*3/uL (ref 0.0–0.1)
Basophils Relative: 0 %
Eosinophils Absolute: 0.1 10*3/uL (ref 0.0–0.5)
Eosinophils Relative: 0 %
HCT: 38.7 % (ref 36.0–46.0)
Hemoglobin: 12.4 g/dL (ref 12.0–15.0)
Immature Granulocytes: 1 %
Lymphocytes Relative: 51 %
Lymphs Abs: 12.9 10*3/uL — ABNORMAL HIGH (ref 0.7–4.0)
MCH: 33.4 pg (ref 26.0–34.0)
MCHC: 32 g/dL (ref 30.0–36.0)
MCV: 104.3 fL — ABNORMAL HIGH (ref 80.0–100.0)
Monocytes Absolute: 1.2 10*3/uL — ABNORMAL HIGH (ref 0.1–1.0)
Monocytes Relative: 5 %
Neutro Abs: 10.9 10*3/uL — ABNORMAL HIGH (ref 1.7–7.7)
Neutrophils Relative %: 43 %
Platelet Count: 127 10*3/uL — ABNORMAL LOW (ref 150–400)
RBC: 3.71 MIL/uL — ABNORMAL LOW (ref 3.87–5.11)
RDW: 20.4 % — ABNORMAL HIGH (ref 11.5–15.5)
WBC Count: 25.2 10*3/uL — ABNORMAL HIGH (ref 4.0–10.5)
nRBC: 0.2 % (ref 0.0–0.2)

## 2020-02-10 LAB — CMP (CANCER CENTER ONLY)
ALT: 44 U/L (ref 0–44)
AST: 25 U/L (ref 15–41)
Albumin: 4.1 g/dL (ref 3.5–5.0)
Alkaline Phosphatase: 134 U/L — ABNORMAL HIGH (ref 38–126)
Anion gap: 9 (ref 5–15)
BUN: 9 mg/dL (ref 8–23)
CO2: 24 mmol/L (ref 22–32)
Calcium: 9.6 mg/dL (ref 8.9–10.3)
Chloride: 104 mmol/L (ref 98–111)
Creatinine: 0.76 mg/dL (ref 0.44–1.00)
GFR, Estimated: >60 mL/min (ref >60)
Glucose, Bld: 177 mg/dL — ABNORMAL HIGH (ref 70–99)
Potassium: 4.3 mmol/L (ref 3.5–5.1)
Sodium: 137 mmol/L (ref 135–145)
Total Bilirubin: 0.2 mg/dL — ABNORMAL LOW (ref 0.3–1.2)
Total Protein: 7.2 g/dL (ref 6.5–8.1)

## 2020-02-15 ENCOUNTER — Telehealth: Payer: Self-pay | Admitting: Oncology

## 2020-02-15 NOTE — Telephone Encounter (Signed)
No 12/7 los. No changes made to pt's schedule.  

## 2020-02-15 NOTE — Progress Notes (Signed)
[place]   [phone] [phone]) [phone]   Clinic Follow up Note   Patient Care Team: Kathyrn Lass, MD as PCP - General (Family Medicine) Nahser, [name], MD as PCP - Cardiology (Cardiology) Mauro Kaufmann, RN as Oncology Nurse Navigator Rockwell Germany, RN as Oncology Nurse Navigator Rolm Bookbinder, MD as Consulting Physician (General Surgery) Magrinat, Virgie Dad, MD as Consulting Physician (Oncology) Eppie Gibson, MD as Attending Physician (Radiation Oncology) [name], MD as Consulting Physician (Rheumatology) [name], MD as Consulting Physician (Orthopedic Surgery) Janyth Pupa, DO as Consulting Physician (Obstetrics and Gynecology) Larey Dresser, MD as Consulting Physician (Cardiology) Madelin Rear, MD as Consulting Physician (Endocrinology) 02/16/2020  CHIEF COMPLAINT: Follow up ER/PR/HER2 + breast cancer   CURRENT THERAPY: adjuvant carboplatin and gemcitabine with trastuzumab-dkst q3 weeks starting 11/24/19 s/p 4 cycles, plan to change to T-DM1 q3 weeks starting 11/24/19 due to poor tolerance.   INTERVAL HISTORY: [name] returns for follow up and treatment as scheduled, daughter is present. She completed C4 carbo/gem and herceptin on 01/26/20. Her main complaint is restless leg which predated chemo, on Requip and Zanaflex.  Symptoms are worse with treatment then improve but still overall very bothersome and affects rest/sleep. She also has daily headaches, "prone to headaches" before chemo but worse now. Headaches start at base of neck then spread foreward. She has chronic neck issues. Migraine and other OTC meds "don't touch it" so she takes more. Baseline neuropathy is stable. Mild nausea and diarrhea are managed with compazine and imodium respectively. Energy and appetite are fair. Has 1 sore on her tongue but not limiting po. Denies concerns with her breast incision, fever, chills, cough, chest pain, dyspnea, edema, or new  pain.     MEDICAL HISTORY:  Past Medical History:  Diagnosis Date  . Anemia   . Anxiety   . Breast cancer ([place]) 09/2019   left breast IMC  . CAD (coronary artery disease)    a. 3/2007s/p DES to the LAD [facility]); b. 01/2017 MV: EF 80%, small, mild apical ant defect w/o ischemia (felt to be breast atten). Low risk.  . Depression   . DM (diabetes mellitus) ([place])   . Essential hypertension   . GERD (gastroesophageal reflux disease)   . Headache    secondary to neck surgery per patient  . High triglycerides   . Hyperlipidemia   . Hypothyroid   . OA (osteoarthritis)    right knee,hands  . Overflow incontinence   . PONV (postoperative nausea and vomiting)    s/p gallbladder   . RLS (restless legs syndrome)   . Urinary urgency   . Uterine cancer ([place]) dx'd 2014    SURGICAL HISTORY: Past Surgical History:  Procedure Laterality Date  . BREAST LUMPECTOMY WITH RADIOACTIVE SEED AND SENTINEL LYMPH NODE BIOPSY Left 10/07/2019   Procedure: LEFT BREAST LUMPECTOMY WITH RADIOACTIVE SEED AND SENTINEL LYMPH NODE BIOPSY;  Surgeon: Rolm Bookbinder, MD;  Location: [place];  Service: General;  Laterality: Left;  . CORONARY STENT PLACEMENT    . CORONARY/GRAFT ACUTE MI REVASCULARIZATION    . FINGER ARTHRODESIS Right 06/11/2019   Procedure: ARTHRODESIS INDEX FINGER DISTAL PHALANGEAL JOINT;  Surgeon: [name], MD;  Location: [place];  Service: Orthopedics;  Laterality: Right;  . FOOT SURGERY Left   . LEFT HEART CATH AND CORONARY ANGIOGRAPHY N/A 02/16/2018   Procedure: LEFT HEART CATH AND CORONARY ANGIOGRAPHY;  Surgeon: Lorretta Harp, MD;  Location: [facility];  Service: Cardiovascular;  Laterality: N/A;  . PORTACATH PLACEMENT Right 11/23/2019   Procedure: INSERTION PORT-A-CATH WITH ULTRASOUND GUIDANCE;  Surgeon: Rolm Bookbinder, MD;  Location: [place];  Service: General;  Laterality: Right;  . REPLACEMENT TOTAL KNEE Right    . SPINE SURGERY    . TOTAL ABDOMINAL HYSTERECTOMY     FOR UTERUS CANCER  . TOTAL HIP ARTHROPLASTY Left     I have reviewed the social history and family history with the patient and they are unchanged from previous note.  ALLERGIES:  is allergic to chloraprep one step [chlorhexidine gluconate], estrogens, and shrimp [shellfish allergy].  MEDICATIONS:  Current Outpatient Medications  Medication Sig Dispense Refill  . ACCU-CHEK GUIDE test strip 1 each by Other route as needed.    . Ascorbic Acid (VITAMIN C PO) Take 1 tablet by mouth daily.    Marland Kitchen aspirin EC 81 MG tablet Take 81 mg by mouth daily.    Marland Kitchen atorvastatin (LIPITOR) 40 MG tablet TAKE 1 TABLET BY MOUTH EVERY DAY 90 tablet 1  . bisoprolol (ZEBETA) 5 MG tablet TAKE 1 TABLET BY MOUTH EVERY DAY 90 tablet 2  . Calcium Citrate-Vitamin D (CALCIUM + D PO) Take 1 tablet by mouth daily with supper.    Marland Kitchen CINNAMON PO Take 1 capsule by mouth daily with supper.    . diclofenac sodium (VOLTAREN) 1 % GEL Apply 2-4 g topically 4 (four) times daily as needed (as directed for pain).    Marland Kitchen diclofenac Sodium (VOLTAREN) 1 % GEL Apply 1 application topically 3 (three) times daily as needed.    . Dulaglutide (TRULICITY) 1.5 MP/5.3IR SOPN Inject 1.5 mg into the skin once a week.    . escitalopram (LEXAPRO) 10 MG tablet Take 10 mg by mouth daily.    . Ferrous Sulfate (IRON PO) Take 1 tablet by mouth daily. alternates days:1 tablet one day and 2 tablets the next day    . ferrous sulfate 325 (65 FE) MG EC tablet Take 325 mg by mouth 3 (three) times daily with meals.    Marland Kitchen GLIPIZIDE XL 10 MG 24 hr tablet Take 20 mg by mouth daily.   3  . JARDIANCE 25 MG TABS tablet Take 25 mg by mouth every morning.   3  . levothyroxine (SYNTHROID, LEVOTHROID) 125 MCG tablet Take 125 mcg by mouth daily before breakfast.    . metFORMIN (GLUCOPHAGE) 500 MG tablet Take 500 mg by mouth 2 (two) times daily with a meal.    . metroNIDAZOLE (METROGEL) 1 % gel Apply 1 application  topically as needed (as directed to affected area).     . Multiple Vitamins-Calcium (ONE-A-DAY WOMENS PO) Take 1 tablet by mouth daily.    . nitroGLYCERIN (NITROSTAT) 0.4 MG SL tablet Place 1 tablet (0.4 mg total) under the tongue every 5 (five) minutes as needed for chest pain. 25 tablet 6  . omeprazole (PRILOSEC) 40 MG capsule Take 40 mg by mouth daily with supper.     . prochlorperazine (COMPAZINE) 10 MG tablet Take 1 tablet by mouth before meals and at bedtime on days 2 and 3 after chemotherapy (counting chemotherapy day as day 1); after that may take as needed 30 tablet 1  . rOPINIRole (REQUIP) 0.5 MG tablet Take 0.5 mg by mouth at bedtime. 1-3 hours prior to bedtime    . tiZANidine (ZANAFLEX) 4 MG tablet Take 4 mg by mouth 3 (three) times daily. As needed    . TURMERIC PO Take  1 capsule by mouth daily with supper.     Current Facility-Administered Medications  Medication Dose Route Frequency Provider Last Rate Last Admin  . LORazepam (ATIVAN) tablet 0.5 mg  0.5 mg Oral Once Alla Feeling, NP       Facility-Administered Medications Ordered in Other Visits  Medication Dose Route Frequency Provider Last Rate Last Admin  . acetaminophen (TYLENOL) tablet 650 mg  650 mg Oral Once Magrinat, Virgie Dad, MD      . ado-trastuzumab emtansine (KADCYLA) 280 mg in sodium chloride 0.9 % 250 mL chemo infusion  3.6 mg/kg (Treatment Plan Recorded) Intravenous Once Magrinat, Virgie Dad, MD      . diphenhydrAMINE (BENADRYL) capsule 25 mg  25 mg Oral Once Magrinat, Virgie Dad, MD      . heparin lock flush 100 unit/mL  500 Units Intracatheter Once PRN Magrinat, Virgie Dad, MD      . sodium chloride flush (NS) 0.9 % injection 10 mL  10 mL Intracatheter PRN Magrinat, Virgie Dad, MD        PHYSICAL EXAMINATION:  Vitals:   02/16/20 0928  BP: 131/75  Pulse: 75  Resp: 14  Temp: 97.7 F (36.5 C)  SpO2: 97%   Filed Weights   02/16/20 0928  Weight: 175 lb 6.4 oz (79.6 kg)    GENERAL:alert, no distress and  comfortable SKIN: no rash  EYES: sclera clear OROPHARYNX: no thrush or ulcers LUNGS: clear with normal breathing effort HEART: regular rate & rhythm, no lower extremity edema NEURO: alert & oriented x 3 with fluent speech Breast exam deferred PAC without erythema   LABORATORY DATA:  I have reviewed the data as listed CBC Latest Ref Rng & Units 02/16/2020 02/10/2020 01/26/2020  WBC 4.0 - 10.5 K/uL 15.2(H) 25.2(H) 15.4(H)  Hemoglobin 12.0 - 15.0 g/dL 12.3 12.4 12.5  Hematocrit 36.0 - 46.0 % 37.8 38.7 39.2  Platelets 150 - 400 K/uL 217 127(L) 231     CMP Latest Ref Rng & Units 02/16/2020 02/10/2020 01/26/2020  Glucose 70 - 99 mg/dL 195(H) 177(H) 235(H)  BUN 8 - 23 mg/dL 15 9 18   Creatinine 5.78 - 1.00 mg/dL 0.71 0.76 0.84  Sodium 135 - 145 mmol/L 140 137 139  Potassium 3.5 - 5.1 mmol/L 4.0 4.3 4.2  Chloride 98 - 111 mmol/L 107 104 107  CO2 22 - 32 mmol/L 23 24 22   Calcium 8.9 - 10.3 mg/dL 9.6 9.6 9.9  Total Protein 6.5 - 8.1 g/dL 7.2 7.2 7.0  Total Bilirubin 0.3 - 1.2 mg/dL 0.3 0.2(L) 0.4  Alkaline Phos 38 - 126 U/L 110 134(H) 98  AST 15 - 41 U/L 23 25 20   ALT 0 - 44 U/L 35 44 27      RADIOGRAPHIC STUDIES: I have personally reviewed the radiological images as listed and agreed with the findings in the report. No results found.   ASSESSMENT & PLAN: 70 year old postmenopausal female  1.  Malignant neoplasm of the upper outer quadrant left breast -Diagnosed 08/30/2019 with clinical cT1cN0 stage IA - s/p left lumpectomy for a pT1cpN1 stage IB, grade 2, ER/PR + Her2 + MIB-1 of 20% with negative margins  -began adjuvant chemo with carboplatin and gemcitabine plus trastuzumab q21 days x6 starting 11/24/19. Discontinued after 4 cycles due to poor tolerance -plan to start T-DM1 q3 weeks through 11/2020 -treatment plan includes adjuvant radiation and anti-estrogen to follow   2.  History of stage Ia endometrial carcinoma, FIGO grade 3, stage IA -s/p TAH/BSO 2014, s/p vaginal  brachytherapy. No adjuvant chemotherapy   3. Chemo side effects/symptom management - restless leg, headaches, n/v, diarrhea -RLS and headaches predate chemo, but worsened on treatment. She does not have neurologist. Continue requip and zanaflex for RLS. HA's multifactorial -compazine for n/v -imodium for diarrhea  Dispo:  Ms. Gootee appears stable. She completed 4 cycles of adjuvant carboplatin, gemcitabine, and trastuzumab with GCSF. She tolerated poorly with mild nausea, diarrhea, headaches and significant restless leg syndrome which worsened from baseline. We reviewed symptom management in detail. I encouraged her to continue compazine for n/v, imodium for diarrhea, and requip for RLS which has been prescribed in the past. She also has zanaflex PRN. Will try low dose ativan 0.5 mg po x1 in infusion to see if that helps restless leg.   We discussed her existing headaches that have worsened on chemo. Likely multifactorial. I encouraged her to use muscle relaxer PRN for chronic neck tension, improve consistent nutrition/hydration, and reduce OTC's for possible med overuse headache syndrome. I recommend headache diary. Will refer to neuro if needed.   Labs reviewed, adequate for treatment. She will proceed with first Kadcyla today, continue q3 weeks through 11/2020. We reviewed the rationale and goal of treatment. F/up with Dr. Jana Hakim and cycle 2 in 3 weeks.   All questions were answered. The patient knows to call the clinic with any problems, questions or concerns. No barriers to learning were detected. Total encounter time was 30 minutes.      Alla Feeling, NP 02/16/20

## 2020-02-16 ENCOUNTER — Inpatient Hospital Stay: Payer: Medicare Other

## 2020-02-16 ENCOUNTER — Inpatient Hospital Stay: Payer: Medicare Other | Admitting: Nurse Practitioner

## 2020-02-16 ENCOUNTER — Encounter: Payer: Self-pay | Admitting: Nurse Practitioner

## 2020-02-16 ENCOUNTER — Other Ambulatory Visit: Payer: Medicare Other

## 2020-02-16 ENCOUNTER — Ambulatory Visit: Payer: Medicare Other | Admitting: Adult Health

## 2020-02-16 ENCOUNTER — Inpatient Hospital Stay: Payer: Medicare Other | Attending: Oncology

## 2020-02-16 ENCOUNTER — Encounter: Payer: Self-pay | Admitting: *Deleted

## 2020-02-16 ENCOUNTER — Other Ambulatory Visit: Payer: Self-pay

## 2020-02-16 DIAGNOSIS — R519 Headache, unspecified: Secondary | ICD-10-CM | POA: Insufficient documentation

## 2020-02-16 DIAGNOSIS — C50412 Malignant neoplasm of upper-outer quadrant of left female breast: Secondary | ICD-10-CM

## 2020-02-16 DIAGNOSIS — E785 Hyperlipidemia, unspecified: Secondary | ICD-10-CM | POA: Diagnosis not present

## 2020-02-16 DIAGNOSIS — I10 Essential (primary) hypertension: Secondary | ICD-10-CM | POA: Diagnosis not present

## 2020-02-16 DIAGNOSIS — I2510 Atherosclerotic heart disease of native coronary artery without angina pectoris: Secondary | ICD-10-CM | POA: Insufficient documentation

## 2020-02-16 DIAGNOSIS — R197 Diarrhea, unspecified: Secondary | ICD-10-CM | POA: Diagnosis not present

## 2020-02-16 DIAGNOSIS — Z833 Family history of diabetes mellitus: Secondary | ICD-10-CM | POA: Insufficient documentation

## 2020-02-16 DIAGNOSIS — Z79899 Other long term (current) drug therapy: Secondary | ICD-10-CM | POA: Diagnosis not present

## 2020-02-16 DIAGNOSIS — Z818 Family history of other mental and behavioral disorders: Secondary | ICD-10-CM | POA: Insufficient documentation

## 2020-02-16 DIAGNOSIS — Z8249 Family history of ischemic heart disease and other diseases of the circulatory system: Secondary | ICD-10-CM | POA: Diagnosis not present

## 2020-02-16 DIAGNOSIS — Z170 Estrogen receptor positive status [ER+]: Secondary | ICD-10-CM | POA: Diagnosis not present

## 2020-02-16 DIAGNOSIS — G629 Polyneuropathy, unspecified: Secondary | ICD-10-CM | POA: Diagnosis not present

## 2020-02-16 DIAGNOSIS — Z7984 Long term (current) use of oral hypoglycemic drugs: Secondary | ICD-10-CM | POA: Diagnosis not present

## 2020-02-16 DIAGNOSIS — G2581 Restless legs syndrome: Secondary | ICD-10-CM | POA: Insufficient documentation

## 2020-02-16 DIAGNOSIS — Z5112 Encounter for antineoplastic immunotherapy: Secondary | ICD-10-CM | POA: Diagnosis not present

## 2020-02-16 DIAGNOSIS — E109 Type 1 diabetes mellitus without complications: Secondary | ICD-10-CM | POA: Insufficient documentation

## 2020-02-16 DIAGNOSIS — Z808 Family history of malignant neoplasm of other organs or systems: Secondary | ICD-10-CM | POA: Diagnosis not present

## 2020-02-16 DIAGNOSIS — Z8542 Personal history of malignant neoplasm of other parts of uterus: Secondary | ICD-10-CM | POA: Diagnosis not present

## 2020-02-16 DIAGNOSIS — Z90722 Acquired absence of ovaries, bilateral: Secondary | ICD-10-CM | POA: Diagnosis not present

## 2020-02-16 DIAGNOSIS — Z95828 Presence of other vascular implants and grafts: Secondary | ICD-10-CM

## 2020-02-16 DIAGNOSIS — Z803 Family history of malignant neoplasm of breast: Secondary | ICD-10-CM | POA: Diagnosis not present

## 2020-02-16 LAB — CMP (CANCER CENTER ONLY)
ALT: 35 U/L (ref 0–44)
AST: 23 U/L (ref 15–41)
Albumin: 4 g/dL (ref 3.5–5.0)
Alkaline Phosphatase: 110 U/L (ref 38–126)
Anion gap: 10 (ref 5–15)
BUN: 15 mg/dL (ref 8–23)
CO2: 23 mmol/L (ref 22–32)
Calcium: 9.6 mg/dL (ref 8.9–10.3)
Chloride: 107 mmol/L (ref 98–111)
Creatinine: 0.71 mg/dL (ref 0.44–1.00)
GFR, Estimated: >60 mL/min (ref >60)
Glucose, Bld: 195 mg/dL — ABNORMAL HIGH (ref 70–99)
Potassium: 4 mmol/L (ref 3.5–5.1)
Sodium: 140 mmol/L (ref 135–145)
Total Bilirubin: 0.3 mg/dL (ref 0.3–1.2)
Total Protein: 7.2 g/dL (ref 6.5–8.1)

## 2020-02-16 LAB — CBC WITH DIFFERENTIAL (CANCER CENTER ONLY)
Abs Immature Granulocytes: 0.05 10*3/uL (ref 0.00–0.07)
Basophils Absolute: 0 10*3/uL (ref 0.0–0.1)
Basophils Relative: 0 %
Eosinophils Absolute: 0.1 10*3/uL (ref 0.0–0.5)
Eosinophils Relative: 1 %
HCT: 37.8 % (ref 36.0–46.0)
Hemoglobin: 12.3 g/dL (ref 12.0–15.0)
Immature Granulocytes: 0 %
Lymphocytes Relative: 64 %
Lymphs Abs: 9.7 10*3/uL — ABNORMAL HIGH (ref 0.7–4.0)
MCH: 34.1 pg — ABNORMAL HIGH (ref 26.0–34.0)
MCHC: 32.5 g/dL (ref 30.0–36.0)
MCV: 104.7 fL — ABNORMAL HIGH (ref 80.0–100.0)
Monocytes Absolute: 0.9 10*3/uL (ref 0.1–1.0)
Monocytes Relative: 6 %
Neutro Abs: 4.4 10*3/uL (ref 1.7–7.7)
Neutrophils Relative %: 29 %
Platelet Count: 217 10*3/uL (ref 150–400)
RBC: 3.61 MIL/uL — ABNORMAL LOW (ref 3.87–5.11)
RDW: 21.1 % — ABNORMAL HIGH (ref 11.5–15.5)
WBC Count: 15.2 10*3/uL — ABNORMAL HIGH (ref 4.0–10.5)
nRBC: 0 % (ref 0.0–0.2)

## 2020-02-16 MED ORDER — HEPARIN SOD (PORK) LOCK FLUSH 100 UNIT/ML IV SOLN
500.0000 [IU] | Freq: Once | INTRAVENOUS | Status: AC | PRN
  Administered 2020-02-16: 500 [IU]
  Filled 2020-02-16: qty 5

## 2020-02-16 MED ORDER — DIPHENHYDRAMINE HCL 25 MG PO CAPS
ORAL_CAPSULE | ORAL | Status: AC
  Filled 2020-02-16: qty 1

## 2020-02-16 MED ORDER — SODIUM CHLORIDE 0.9 % IV SOLN
3.6000 mg/kg | Freq: Once | INTRAVENOUS | Status: AC
  Administered 2020-02-16: 280 mg via INTRAVENOUS
  Filled 2020-02-16: qty 14

## 2020-02-16 MED ORDER — SODIUM CHLORIDE 0.9% FLUSH
10.0000 mL | Freq: Once | INTRAVENOUS | Status: AC
  Administered 2020-02-16: 10 mL
  Filled 2020-02-16: qty 10

## 2020-02-16 MED ORDER — LORAZEPAM 1 MG PO TABS
0.5000 mg | ORAL_TABLET | Freq: Once | ORAL | Status: AC
  Administered 2020-02-16: 0.5 mg via ORAL

## 2020-02-16 MED ORDER — ACETAMINOPHEN 325 MG PO TABS
ORAL_TABLET | ORAL | Status: AC
  Filled 2020-02-16: qty 2

## 2020-02-16 MED ORDER — SODIUM CHLORIDE 0.9 % IV SOLN
Freq: Once | INTRAVENOUS | Status: AC
  Filled 2020-02-16: qty 250

## 2020-02-16 MED ORDER — SODIUM CHLORIDE 0.9% FLUSH
10.0000 mL | INTRAVENOUS | Status: DC | PRN
  Administered 2020-02-16: 10 mL
  Filled 2020-02-16: qty 10

## 2020-02-16 MED ORDER — PROCHLORPERAZINE MALEATE 10 MG PO TABS: ORAL_TABLET | ORAL | 1 refills | Status: DC

## 2020-02-16 MED ORDER — ACETAMINOPHEN 325 MG PO TABS
650.0000 mg | ORAL_TABLET | Freq: Once | ORAL | Status: AC
  Administered 2020-02-16: 650 mg via ORAL

## 2020-02-16 MED ORDER — DIPHENHYDRAMINE HCL 25 MG PO CAPS
25.0000 mg | ORAL_CAPSULE | Freq: Once | ORAL | Status: AC
  Administered 2020-02-16: 25 mg via ORAL

## 2020-02-16 MED ORDER — LORAZEPAM 1 MG PO TABS
ORAL_TABLET | ORAL | Status: AC
  Filled 2020-02-16: qty 1

## 2020-02-16 NOTE — Patient Instructions (Signed)
[facility] Discharge Instructions for Patients Receiving Chemotherapy  Today you received the following chemotherapy agents kadcyla  To help prevent nausea and vomiting after your treatment, we encourage you to take your nausea medication as directed   If you develop nausea and vomiting that is not controlled by your nausea medication, call the clinic.   BELOW ARE SYMPTOMS THAT SHOULD BE REPORTED IMMEDIATELY:  *FEVER GREATER THAN 100.5 F  *CHILLS WITH OR WITHOUT FEVER  NAUSEA AND VOMITING THAT IS NOT CONTROLLED WITH YOUR NAUSEA MEDICATION  *UNUSUAL SHORTNESS OF BREATH  *UNUSUAL BRUISING OR BLEEDING  TENDERNESS IN MOUTH AND THROAT WITH OR WITHOUT PRESENCE OF ULCERS  *URINARY PROBLEMS  *BOWEL PROBLEMS  UNUSUAL RASH Items with * indicate a potential emergency and should be followed up as soon as possible.  Feel free to call the clinic should you have any questions or concerns. The clinic phone number is [phone].  Please show the CHEMO ALERT CARD at check-in to the Emergency Department and triage nurse.   

## 2020-02-17 ENCOUNTER — Telehealth: Payer: Self-pay | Admitting: Oncology

## 2020-02-17 NOTE — Telephone Encounter (Signed)
Scheduled appts per 2/2 los. Pt to get updated appt calendar at next visit.

## 2020-02-18 ENCOUNTER — Inpatient Hospital Stay: Payer: Medicare Other

## 2020-02-25 ENCOUNTER — Encounter: Payer: Medicare Other | Admitting: Rehabilitation

## 2020-02-28 ENCOUNTER — Encounter: Payer: Medicare Other | Attending: Family Medicine | Admitting: Registered"

## 2020-02-28 ENCOUNTER — Other Ambulatory Visit: Payer: Self-pay

## 2020-02-28 ENCOUNTER — Encounter: Payer: Self-pay | Admitting: Registered"

## 2020-02-28 DIAGNOSIS — E119 Type 2 diabetes mellitus without complications: Secondary | ICD-10-CM | POA: Diagnosis not present

## 2020-02-28 NOTE — Patient Instructions (Addendum)
Goals: 9 am take medicine and eat sandwich (egg sandwich or one from a restaurant) Make a to-do list, make commitments to help motivate you get up on days off. Instead of cupcake in evening have cheese and crackers

## 2020-02-28 NOTE — Progress Notes (Signed)
Diabetes Self-Management Education  Visit Type: Follow-up  Appt. Start Time: 1550 Appt. End Time: 1625  03/03/2020  [name], identified by name and date of birth, is a 70 y.o. female with a diagnosis of Diabetes:  .   ASSESSMENT  There were no vitals taken for this visit. There is no height or weight on file to calculate BMI.   Medication: Trulicity on Tuesday, Glipizide 2, Jardiance  Pt states she is taking Lexapro and it is helping a little bit, has been seeing a counselor since holidays. Pt reports she had an eviction notice before the grace period to pay rent. Pt states the parking lot lights where she lives are out.  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  Patient states she stoppped chemo treatment because the 4th treatment she was sick and had diarrhea x2 weeks. Pt reports her first treatment with lower dose chemo will go until Nov and although disappointed is a longer duration of treatment is glad that she has more energy.   Pt states she interviewed last week for a full-time position which she states will be less stressful than her part-time position. 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  SMBG: Fasting: 149 today 158 yesterday Not taking medications correctly lately,    Pt states after appointment she plans to cook Pork butt, cabbage, spaghetti squash will eat for a few days    Diabetes Self-Management Education - 02/28/20 1605      Visit Information   Visit Type Follow-up      Dietary Intake   Breakfast english muffin, smart balance, tea    Lunch [place] sandwich, tomato    Snack (afternoon) banana, cheese stick      Individualized Goals (developed by patient)   Nutrition Other (comment)   change evening snack   Medications Other (comment)   create a 9 am routine  to help take medication   Problem Solving --   make yourself accountabel to others to get you moving     Outcomes   Expected Outcomes Demonstrated interest in learning. Expect positive outcomes    Future DMSE 4-6 wks    Program Status Not Completed      Subsequent Visit   Since your last visit have you continued or begun to take your medications as prescribed? --   doesn't take morning meds until wake up later   Since your last visit, are you checking your blood glucose at least once a day? Yes           Individualized Plan for Diabetes Self-Management Training:   Learning Objective:  Patient will have a greater understanding of diabetes self-management. Patient education plan is to attend individual and/or group sessions per assessed needs and concerns.    Patient Instructions  Goals: 9 am take medicine and eat sandwich (egg sandwich or one from a restaurant) Make a to-do list, make commitments to help motivate you get up on days off. Instead of cupcake in evening have cheese and crackers   Expected Outcomes:  Demonstrated interest in learning. Expect positive outcomes  Education material provided: none  If problems or questions, patient to contact team via:  Phone  Future DSME appointment: 4-6 wks

## 2020-03-02 NOTE — Progress Notes (Signed)
[facility] Patient and [facility] Counseling Note  Patient and counselor reconnected after several months apart. Patient provided medical updates, job updates, and how she is now doing emotionally. Patient and counselor processed emotions in the past months and the patient described some of her worries and what helped relieve those worries. Patient described wanting to "just get there" in terms of being better, so counselor asked "how will you know you got there?" to help establish goals. Throughout session, counselor provided reflective listening, normalization of feelings, psychoeducation, and a non-judgmental space. Patient was oriented times three in the session. Her mood was both stressed and relieved. Her affect was congruent and there were no signs of SI/HI/NSSI. The patient is adjusting to her new treatment, which has resulted in a mood boost. Thinking about bills (I.e "Can I pay all my bills/stay afloat?" has resulted in distress and worry for the patient. Combined with the stress and fatigue of chemo, patient had retreated from the world and was depressed. As treatment improves and patient learns skills, she will continue to feel better. Next session will identify goals and potentially introduce a gratitude jar.  Gaylyn Rong Counseling Intern

## 2020-03-07 NOTE — Progress Notes (Signed)
[place]  [phone] [phone]     ID: Felicia [name] DOB: 05-30-1950  MR#: [id number]  QQV#:[id number]  Patient Care Team: Kathyrn Lass, MD as PCP - General (Family Medicine) Nahser, [name], MD as PCP - Cardiology (Cardiology) Mauro Kaufmann, RN as Oncology Nurse Navigator Rockwell Germany, RN as Oncology Nurse Navigator Rolm Bookbinder, MD as Consulting Physician (General Surgery) Ignacio Lowder, Virgie Dad, MD as Consulting Physician (Oncology) Eppie Gibson, MD as Attending Physician (Radiation Oncology) [name], MD as Consulting Physician (Rheumatology) [name], MD as Consulting Physician (Orthopedic Surgery) Janyth Pupa, DO as Consulting Physician (Obstetrics and Gynecology) Larey Dresser, MD as Consulting Physician (Cardiology) Madelin Rear, MD as Consulting Physician (Endocrinology) Chauncey Cruel, MD OTHER MD:  CHIEF COMPLAINT: Triple positive invasive breast cancer  CURRENT TREATMENT: T-DM1   INTERVAL HISTORY: Shivali returns today for follow up and treatment of her triple positive invasive breast cancer.    She was switched to T-DM1 at her last visit on 02/16/2020.  She is tolerating this much better.  She is receiving lorazepam with her treatment and does not have the restless legs problems she was having before.  Her most recent echocardiogram from 01/17/2020 showed a normal EF of 60-65%.   REVIEW OF SYSTEMS: Felicia [name] tried to change her jobs to something that had few hours and better pay but unfortunately she was not able to make that move.  She continues to work full-time.  A detailed review of systems today was stable   COVID 19 VACCINATION STATUS: Refuses vaccination   HISTORY OF CURRENT ILLNESS: From the original intake note:  Felicia [name] Creer had routine screening mammography on 08/03/2019 showing a possible abnormality in the left breast. She underwent left diagnostic mammography with tomography and  left breast ultrasonography at [facility] on 08/17/2019 showing: breast density category B; 1.6 cm upper-outer left breast mass; no significant left axillary abnormalities.  Accordingly on 08/30/2019 she proceeded to biopsy of the left breast area in question. The pathology from this procedure ([id number]) showed: invasive mammary carcinoma, e-cadherin positive, grade 2. Prognostic indicators significant for: estrogen receptor, 100% positive and progesterone receptor, 40% positive, both with strong staining intensity. Proliferation marker Ki67 at 20%. HER2 equivocal by immunohistochemistry (2+), but negative by fluorescent in situ hybridization with a signals ratio   and number per cell  .  Of note, she also has a history of FIGO grade 3, stage IA endometrial carcinoma, diagnosed in 2014 in [place]. She underwent robotic assisted total hysterectomy with BSO and pelvic and perirectal aortic lymph node dissection as well as omental biopsy.  Tumor was found only in the endometrium arising in a polyp measuring 1.7 cm.  There was no myometrial invasion no lymphovascular space invasion, and no lower uterine segment involvement.  The cervix and adnexa, lymph nodes and omentum were all not involved.  She then received vaginal brachytherapy, no chemotherapy, completing treatment in the springtime of 2015. [Per patient's report, chemotherapy was suggested, but she opted against this.]  The patient's subsequent history is as detailed below.   PAST MEDICAL HISTORY: Past Medical History:  Diagnosis Date  . Anemia   . Anxiety   . Breast cancer ([place]) 09/2019   left breast IMC  . CAD (coronary artery disease)    a. 3/2007s/p DES to the LAD [facility]); b. 01/2017 MV: EF 80%, small, mild apical ant defect w/o ischemia (felt to be breast atten). Low risk.  . Depression   . DM (diabetes  mellitus) ([place])   . Essential hypertension   . GERD (gastroesophageal reflux disease)   . Headache    secondary to neck  surgery per patient  . High triglycerides   . Hyperlipidemia   . Hypothyroid   . OA (osteoarthritis)    right knee,hands  . Overflow incontinence   . PONV (postoperative nausea and vomiting)    s/p gallbladder   . RLS (restless legs syndrome)   . Urinary urgency   . Uterine cancer ([place]) dx'd 2014    PAST SURGICAL HISTORY: Past Surgical History:  Procedure Laterality Date  . BREAST LUMPECTOMY WITH RADIOACTIVE SEED AND SENTINEL LYMPH NODE BIOPSY Left 10/07/2019   Procedure: LEFT BREAST LUMPECTOMY WITH RADIOACTIVE SEED AND SENTINEL LYMPH NODE BIOPSY;  Surgeon: Rolm Bookbinder, MD;  Location: [place];  Service: General;  Laterality: Left;  . CORONARY STENT PLACEMENT    . CORONARY/GRAFT ACUTE MI REVASCULARIZATION    . FINGER ARTHRODESIS Right 06/11/2019   Procedure: ARTHRODESIS INDEX FINGER DISTAL PHALANGEAL JOINT;  Surgeon: [name], MD;  Location: [place];  Service: Orthopedics;  Laterality: Right;  . FOOT SURGERY Left   . LEFT HEART CATH AND CORONARY ANGIOGRAPHY N/A 02/16/2018   Procedure: LEFT HEART CATH AND CORONARY ANGIOGRAPHY;  Surgeon: Lorretta Harp, MD;  Location: [place] [facility];  Service: Cardiovascular;  Laterality: N/A;  . PORTACATH PLACEMENT Right 11/23/2019   Procedure: INSERTION PORT-A-CATH WITH ULTRASOUND GUIDANCE;  Surgeon: Rolm Bookbinder, MD;  Location: [place];  Service: General;  Laterality: Right;  . REPLACEMENT TOTAL KNEE Right   . SPINE SURGERY    . TOTAL ABDOMINAL HYSTERECTOMY     FOR UTERUS CANCER  . TOTAL HIP ARTHROPLASTY Left     FAMILY HISTORY: Family History  Problem Relation Age of Onset  . AAA (abdominal aortic aneurysm) Mother   . Diabetes Father   . Alzheimer's disease Father   . Throat cancer Sister   . Breast cancer Cousin    Her father died at age 12 from Alzheimer's. Her mother died at age 56 from a ruptured abdominal aneurysm. Felicia [name] has 2 brothers and 3 sisters. She  reports breast cancer in a paternal cousin. She also notes throat cancer in her sister.   GYNECOLOGIC HISTORY:  No LMP recorded. Patient has had a hysterectomy. Menarche: 70 years old Age at first live birth: 70 years old [place] P 2 LMP 1996 Contraceptive: prior use without issue HRT never used  Hysterectomy? Yes, 2014 BSO? 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  She unpacks 40 pounds boxes of chicken, has to cook them in 13 pound cooking pans, and is generally on her feet at work but does not otherwise exercise.  She is divorced. She lives at home by herself, with no pets. Daughter Pershing Proud, age 34, is a radiology tech in [place]. Daughter Martyn Malay, age 34, is an Optometrist in [place], [place]. Mishelle has one grandchild. She is not a Ambulance person.    ADVANCED DIRECTIVES: Not in place; intends to name daughter [name] as her HCPOA.   HEALTH MAINTENANCE: Social History   Tobacco Use  . Smoking status: Never Smoker  . Smokeless tobacco: Never Used  Vaping Use  . Vaping Use: Never used  Substance Use Topics  . Alcohol use: Yes    Comment: OCCASIONALLY  . Drug use: No     Colonoscopy: <10 years ago, [place]  PAP:  2014?  Bone density: date unsure, also in [place]   Allergies  Allergen Reactions  . Chloraprep One Step [Chlorhexidine Gluconate] Rash  . Estrogens Other (See Comments)    PATIENT HAS A HISTORY OF CANCER AND HAS BEEN TOLD TO NEVER TAKE ANYTHING CONTAINING ESTROGEN, AS IT MIGHT CAUSE A RECURRENCE  . Shrimp [Shellfish Allergy] Nausea And Vomiting    Current Outpatient Medications  Medication Sig Dispense Refill  . ACCU-CHEK GUIDE test strip 1 each by Other route as needed.    . Ascorbic Acid (VITAMIN C PO) Take 1 tablet by mouth daily.    Marland Kitchen aspirin EC 81 MG tablet Take 81 mg by mouth daily.    Marland Kitchen atorvastatin (LIPITOR) 40 MG tablet TAKE 1 TABLET BY MOUTH EVERY DAY 90 tablet 1  . bisoprolol (ZEBETA) 5  MG tablet TAKE 1 TABLET BY MOUTH EVERY DAY 90 tablet 2  . Calcium Citrate-Vitamin D (CALCIUM + D PO) Take 1 tablet by mouth daily with supper.    Marland Kitchen CINNAMON PO Take 1 capsule by mouth daily with supper.    . diclofenac sodium (VOLTAREN) 1 % GEL Apply 2-4 g topically 4 (four) times daily as needed (as directed for pain).    Marland Kitchen diclofenac Sodium (VOLTAREN) 1 % GEL Apply 1 application topically 3 (three) times daily as needed.    . Dulaglutide (TRULICITY) 1.5 AY/3.0ZS SOPN Inject 1.5 mg into the skin once a week.    . escitalopram (LEXAPRO) 10 MG tablet Take 10 mg by mouth daily.    . Ferrous Sulfate (IRON PO) Take 1 tablet by mouth daily. alternates days:1 tablet one day and 2 tablets the next day    . ferrous sulfate 325 (65 FE) MG EC tablet Take 325 mg by mouth 3 (three) times daily with meals.    Marland Kitchen GLIPIZIDE XL 10 MG 24 hr tablet Take 20 mg by mouth daily.   3  . JARDIANCE 25 MG TABS tablet Take 25 mg by mouth every morning.   3  . levothyroxine (SYNTHROID, LEVOTHROID) 125 MCG tablet Take 125 mcg by mouth daily before breakfast.    . metFORMIN (GLUCOPHAGE) 500 MG tablet Take 500 mg by mouth 2 (two) times daily with a meal.    . metroNIDAZOLE (METROGEL) 1 % gel Apply 1 application topically as needed (as directed to affected area).     . Multiple Vitamins-Calcium (ONE-A-DAY WOMENS PO) Take 1 tablet by mouth daily.    . nitroGLYCERIN (NITROSTAT) 0.4 MG SL tablet Place 1 tablet (0.4 mg total) under the tongue every 5 (five) minutes as needed for chest pain. 25 tablet 6  . omeprazole (PRILOSEC) 40 MG capsule Take 40 mg by mouth daily with supper.     . prochlorperazine (COMPAZINE) 10 MG tablet Take 1 tablet by mouth before meals and at bedtime on days 2 and 3 after chemotherapy (counting chemotherapy day as day 1); after that may take as needed 30 tablet 1  . rOPINIRole (REQUIP) 0.5 MG tablet Take 0.5 mg by mouth at bedtime. 1-3 hours prior to bedtime    . tiZANidine (ZANAFLEX) 4 MG tablet Take 4 mg  by mouth 3 (three) times daily. As needed    . TURMERIC PO Take 1 capsule by mouth daily with supper.     No current facility-administered medications for this visit.   Facility-Administered Medications Ordered in Other Visits  Medication Dose Route Frequency Provider Last Rate Last Admin  . ado-trastuzumab emtansine (KADCYLA) 280 mg in sodium chloride  0.9 % 250 mL chemo infusion  3.6 mg/kg (Treatment Plan Recorded) Intravenous Once Derward Marple, Virgie Dad, MD 528 mL/hr at 03/08/20 1042 280 mg at 03/08/20 1042    OBJECTIVE: White woman who appears stated age  3:   03/08/20 0906  BP: (!) 146/76  Pulse: 71  Resp: 18  Temp: (!) 97.4 F (36.3 C)  SpO2: 97%     Body mass index is 29.47 kg/m.   Wt Readings from Last 3 Encounters:  03/08/20 171 lb 11.2 oz (77.9 kg)  02/16/20 175 lb 6.4 oz (79.6 kg)  02/10/20 175 lb 6.4 oz (79.6 kg)      ECOG FS:1 - Symptomatic but completely ambulatory  Sclerae unicteric, EOMs intact Wearing a mask No cervical or supraclavicular adenopathy Lungs no rales or rhonchi Heart regular rate and rhythm Abd soft, nontender, positive bowel sounds MSK no focal spinal tenderness, no upper extremity lymphedema Neuro: nonfocal, well oriented, appropriate affect Breasts: Status post right lumpectomy.  There is no evidence of residual or recurrent disease.  Left breast and both axillae are benign   LAB RESULTS:  CMP     Component Value Date/Time   NA 137 03/08/2020 0913   NA 139 11/25/2018 1053   K 3.8 03/08/2020 0913   CL 104 03/08/2020 0913   CO2 23 03/08/2020 0913   GLUCOSE 214 (H) 03/08/2020 0913   BUN 10 03/08/2020 0913   BUN 18 11/25/2018 1053   CREATININE 0.71 03/08/2020 0913   CALCIUM 10.1 03/08/2020 0913   PROT 7.6 03/08/2020 0913   PROT 7.1 11/25/2018 1053   ALBUMIN 4.1 03/08/2020 0913   ALBUMIN 4.2 11/25/2018 1053   AST 29 03/08/2020 0913   ALT 39 03/08/2020 0913   ALKPHOS 75 03/08/2020 0913   BILITOT 0.3 03/08/2020 0913   GFRNONAA  >60 03/08/2020 0913   GFRAA >60 10/04/2019 1330   GFRAA >60 09/08/2019 0811    No results found for: TOTALPROTELP, ALBUMINELP, A1GS, A2GS, BETS, BETA2SER, GAMS, MSPIKE, SPEI  Lab Results  Component Value Date   WBC 11.0 (H) 03/08/2020   NEUTROABS 3.6 03/08/2020   HGB 13.5 03/08/2020   HCT 41.2 03/08/2020   MCV 103.3 (H) 03/08/2020   PLT 258 03/08/2020    No results found for: LABCA2  No components found for: URKYHC623  No results for input(s): INR in the last 168 hours.  No results found for: LABCA2  No results found for: JSE831  No results found for: DVV616  No results found for: WVP710  No results found for: CA2729  No components found for: HGQUANT  No results found for: CEA1 / No results found for: CEA1   No results found for: AFPTUMOR  No results found for: CHROMOGRNA  No results found for: KPAFRELGTCHN, LAMBDASER, KAPLAMBRATIO (kappa/lambda light chains)  No results found for: HGBA, HGBA2QUANT, HGBFQUANT, HGBSQUAN (Hemoglobinopathy evaluation)   No results found for: LDH  No results found for: IRON, TIBC, IRONPCTSAT (Iron and TIBC)  No results found for: FERRITIN  Urinalysis No results found for: COLORURINE, APPEARANCEUR, LABSPEC, PHURINE, GLUCOSEU, HGBUR, BILIRUBINUR, KETONESUR, PROTEINUR, UROBILINOGEN, NITRITE, LEUKOCYTESUR   STUDIES: No results found.   ELIGIBLE FOR AVAILABLE RESEARCH PROTOCOL: AET  ASSESSMENT: 69 y.o. [place] woman status post left breast upper outer quadrant biopsy 08/30/2019 for a clinical T1c N0, stage IA invasive ductal carcinoma, grade 2, estrogen and progesterone receptor positive, HER-2 amplified, with an MIB-1 of 20%.  (0) status post TAH/BSO 2014 for a FIGO grade 3, stage IA endometrial carcinoma, status  post vaginal brachytherapy, no adjuvant chemotherapy  (1) status post left lumpectomy and axillary sentinel node sampling 10/07/2019 for a pT1c pN1, stage IB invasive ductal carcinoma, grade 2, with negative  margins.  (a) a total of 1 sentinel lymph node was removed.  (2) adjuvant chemotherapy consisting of carboplatin, gemcitabine, trastuzumab given every 3 weeks x 6 started 11/24/2019, last dose 01/26/2020  (a) baseline echo 09/15/2019 shows an ejection fraction in the 60-65% range.  (b) will not use docetaxel secondary to severe peripheral neuropathy present at baseline  (c) echo on 01/17/2020 shows EF of 60-65%  (d) chemotherapy discontinued after 4 cycles with poor tolerance.  (3) T-DM1 started 02/16/2020, to be continued through November 2022  (a) echo  (4) adjuvant radiation pending  (5) antiestrogens to start at the completion of local treatment   PLAN: L[place]a feels much better off chemo and is tolerating T-DM1 with no side effects that she is aware of.  The plan is to continue through 14 doses.  She can receive radiation at any time now and I have sent her radiation oncologist in note alerting her to that  For her restless legs I have added lorazepam here when she gets her treatment and I am putting her on gabapentin 300 mg  Otherwise she will see me again in about 12 weeks.  She will need a repeat echocardiogram sometime in April.  She knows to call for any other issue that may develop before that visit.  Total encounter time 25 minutes.Sarajane Jews C. Saleemah Mollenhauer, MD 03/08/20 11:01 AM Medical Oncology and Hematology [facility] [place],  [address] Tel. [phone]    Fax. [phone]   I, Wilburn Mylar, am acting as scribe for Dr. Virgie Dad. Franca Stakes.  I, Lurline Del MD, have reviewed the above documentation for accuracy and completeness, and I agree with the above.   *Total Encounter Time as defined by the Centers for Medicare and Medicaid Services includes, in addition to the face-to-face time of a patient visit (documented in the note above) non-face-to-face time: obtaining and reviewing outside history, ordering and  reviewing medications, tests or procedures, care coordination (communications with other health care professionals or caregivers) and documentation in the medical record.

## 2020-03-08 ENCOUNTER — Telehealth: Payer: Self-pay | Admitting: Oncology

## 2020-03-08 ENCOUNTER — Encounter: Payer: Self-pay | Admitting: *Deleted

## 2020-03-08 ENCOUNTER — Inpatient Hospital Stay: Payer: Medicare Other

## 2020-03-08 ENCOUNTER — Other Ambulatory Visit: Payer: Self-pay

## 2020-03-08 ENCOUNTER — Inpatient Hospital Stay: Payer: Medicare Other | Admitting: Oncology

## 2020-03-08 VITALS — BP 146/76 | HR 71 | Temp 97.4°F | Resp 18 | Ht 64.0 in | Wt 171.7 lb

## 2020-03-08 DIAGNOSIS — Z808 Family history of malignant neoplasm of other organs or systems: Secondary | ICD-10-CM | POA: Diagnosis not present

## 2020-03-08 DIAGNOSIS — I2510 Atherosclerotic heart disease of native coronary artery without angina pectoris: Secondary | ICD-10-CM | POA: Diagnosis not present

## 2020-03-08 DIAGNOSIS — E785 Hyperlipidemia, unspecified: Secondary | ICD-10-CM | POA: Diagnosis not present

## 2020-03-08 DIAGNOSIS — Z170 Estrogen receptor positive status [ER+]: Secondary | ICD-10-CM

## 2020-03-08 DIAGNOSIS — Z7984 Long term (current) use of oral hypoglycemic drugs: Secondary | ICD-10-CM | POA: Diagnosis not present

## 2020-03-08 DIAGNOSIS — Z833 Family history of diabetes mellitus: Secondary | ICD-10-CM | POA: Diagnosis not present

## 2020-03-08 DIAGNOSIS — C50412 Malignant neoplasm of upper-outer quadrant of left female breast: Secondary | ICD-10-CM

## 2020-03-08 DIAGNOSIS — Z79899 Other long term (current) drug therapy: Secondary | ICD-10-CM | POA: Diagnosis not present

## 2020-03-08 DIAGNOSIS — G2581 Restless legs syndrome: Secondary | ICD-10-CM | POA: Diagnosis not present

## 2020-03-08 DIAGNOSIS — R519 Headache, unspecified: Secondary | ICD-10-CM | POA: Diagnosis not present

## 2020-03-08 DIAGNOSIS — G629 Polyneuropathy, unspecified: Secondary | ICD-10-CM | POA: Diagnosis not present

## 2020-03-08 DIAGNOSIS — R197 Diarrhea, unspecified: Secondary | ICD-10-CM | POA: Diagnosis not present

## 2020-03-08 DIAGNOSIS — Z5112 Encounter for antineoplastic immunotherapy: Secondary | ICD-10-CM | POA: Diagnosis not present

## 2020-03-08 DIAGNOSIS — E109 Type 1 diabetes mellitus without complications: Secondary | ICD-10-CM | POA: Diagnosis not present

## 2020-03-08 DIAGNOSIS — Z8249 Family history of ischemic heart disease and other diseases of the circulatory system: Secondary | ICD-10-CM | POA: Diagnosis not present

## 2020-03-08 DIAGNOSIS — I10 Essential (primary) hypertension: Secondary | ICD-10-CM | POA: Diagnosis not present

## 2020-03-08 DIAGNOSIS — Z803 Family history of malignant neoplasm of breast: Secondary | ICD-10-CM | POA: Diagnosis not present

## 2020-03-08 LAB — CBC WITH DIFFERENTIAL (CANCER CENTER ONLY)
Abs Immature Granulocytes: 0.01 10*3/uL (ref 0.00–0.07)
Basophils Absolute: 0.1 10*3/uL (ref 0.0–0.1)
Basophils Relative: 1 %
Eosinophils Absolute: 0.1 10*3/uL (ref 0.0–0.5)
Eosinophils Relative: 1 %
HCT: 41.2 % (ref 36.0–46.0)
Hemoglobin: 13.5 g/dL (ref 12.0–15.0)
Immature Granulocytes: 0 %
Lymphocytes Relative: 59 %
Lymphs Abs: 6.5 10*3/uL — ABNORMAL HIGH (ref 0.7–4.0)
MCH: 33.8 pg (ref 26.0–34.0)
MCHC: 32.8 g/dL (ref 30.0–36.0)
MCV: 103.3 fL — ABNORMAL HIGH (ref 80.0–100.0)
Monocytes Absolute: 0.7 10*3/uL (ref 0.1–1.0)
Monocytes Relative: 6 %
Neutro Abs: 3.6 10*3/uL (ref 1.7–7.7)
Neutrophils Relative %: 33 %
Platelet Count: 258 10*3/uL (ref 150–400)
RBC: 3.99 MIL/uL (ref 3.87–5.11)
RDW: 17.2 % — ABNORMAL HIGH (ref 11.5–15.5)
WBC Count: 11 10*3/uL — ABNORMAL HIGH (ref 4.0–10.5)
nRBC: 0 % (ref 0.0–0.2)

## 2020-03-08 LAB — CMP (CANCER CENTER ONLY)
ALT: 39 U/L (ref 0–44)
AST: 29 U/L (ref 15–41)
Albumin: 4.1 g/dL (ref 3.5–5.0)
Alkaline Phosphatase: 75 U/L (ref 38–126)
Anion gap: 10 (ref 5–15)
BUN: 10 mg/dL (ref 8–23)
CO2: 23 mmol/L (ref 22–32)
Calcium: 10.1 mg/dL (ref 8.9–10.3)
Chloride: 104 mmol/L (ref 98–111)
Creatinine: 0.71 mg/dL (ref 0.44–1.00)
GFR, Estimated: >60 mL/min (ref >60)
Glucose, Bld: 214 mg/dL — ABNORMAL HIGH (ref 70–99)
Potassium: 3.8 mmol/L (ref 3.5–5.1)
Sodium: 137 mmol/L (ref 135–145)
Total Bilirubin: 0.3 mg/dL (ref 0.3–1.2)
Total Protein: 7.6 g/dL (ref 6.5–8.1)

## 2020-03-08 MED ORDER — ACETAMINOPHEN 325 MG PO TABS
650.0000 mg | ORAL_TABLET | Freq: Once | ORAL | Status: AC
  Administered 2020-03-08: 650 mg via ORAL

## 2020-03-08 MED ORDER — DIPHENHYDRAMINE HCL 25 MG PO CAPS
25.0000 mg | ORAL_CAPSULE | Freq: Once | ORAL | Status: AC
  Administered 2020-03-08: 25 mg via ORAL

## 2020-03-08 MED ORDER — LORAZEPAM 1 MG PO TABS
ORAL_TABLET | ORAL | Status: AC
  Filled 2020-03-08: qty 1

## 2020-03-08 MED ORDER — GABAPENTIN 300 MG PO CAPS: 300.0000 mg | ORAL_CAPSULE | Freq: Every day | ORAL | 4 refills | Status: DC

## 2020-03-08 MED ORDER — SODIUM CHLORIDE 0.9 % IV SOLN
Freq: Once | INTRAVENOUS | Status: AC
  Filled 2020-03-08: qty 250

## 2020-03-08 MED ORDER — DIPHENHYDRAMINE HCL 25 MG PO CAPS
ORAL_CAPSULE | ORAL | Status: AC
  Filled 2020-03-08: qty 1

## 2020-03-08 MED ORDER — SODIUM CHLORIDE 0.9 % IV SOLN
3.6000 mg/kg | Freq: Once | INTRAVENOUS | Status: AC
  Administered 2020-03-08: 280 mg via INTRAVENOUS
  Filled 2020-03-08: qty 14

## 2020-03-08 MED ORDER — ACETAMINOPHEN 325 MG PO TABS
ORAL_TABLET | ORAL | Status: AC
  Filled 2020-03-08: qty 2

## 2020-03-08 MED ORDER — LORAZEPAM 1 MG PO TABS
0.5000 mg | ORAL_TABLET | Freq: Once | ORAL | Status: AC
  Administered 2020-03-08: 0.5 mg via ORAL

## 2020-03-08 NOTE — Telephone Encounter (Signed)
Scheduled appointments per 2/23 los. Spoke to patient who is aware of appointments dates and times.

## 2020-03-08 NOTE — Patient Instructions (Signed)
[facility] Discharge Instructions for Patients Receiving Chemotherapy  Today you received the following chemotherapy agents Kadcyla  To help prevent nausea and vomiting after your treatment, we encourage you to take your nausea medication as directed   If you develop nausea and vomiting that is not controlled by your nausea medication, call the clinic.   BELOW ARE SYMPTOMS THAT SHOULD BE REPORTED IMMEDIATELY:  *FEVER GREATER THAN 100.5 F  *CHILLS WITH OR WITHOUT FEVER  NAUSEA AND VOMITING THAT IS NOT CONTROLLED WITH YOUR NAUSEA MEDICATION  *UNUSUAL SHORTNESS OF BREATH  *UNUSUAL BRUISING OR BLEEDING  TENDERNESS IN MOUTH AND THROAT WITH OR WITHOUT PRESENCE OF ULCERS  *URINARY PROBLEMS  *BOWEL PROBLEMS  UNUSUAL RASH Items with * indicate a potential emergency and should be followed up as soon as possible.  Feel free to call the clinic should you have any questions or concerns. The clinic phone number is [phone].  Please show the CHEMO ALERT CARD at check-in to the Emergency Department and triage nurse.   

## 2020-03-08 NOTE — Progress Notes (Signed)
Pt discharged in no apparent distress. Pt left ambulatory without assistance.  Pt aware of discharge instructions and verbalized understanding and had no further questions. Patient waited her full 30 minutes post infusion with no issues

## 2020-03-09 NOTE — Progress Notes (Signed)
[place] [facility] and [facility] Counseling Note  Patient updated counselor on her job and her treatment. Patient identified strengths that had helped her succeed in her jobs. Patient shared her concerns around her other health problems and starting radiation soon. 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 During the latter half of session, patient opened up to share some deeper things from her past that she would like to process. Counselor briefly introduced the gratitude jar at the end of session. Counselor provided empathic listening, a safe space to talk, and reflections. The patient was oriented times three. Her mood was concerned with a congruent affect. There were no indications of  SI/HI/NSSI. The patient is adjusting to her new treatment, which has resulted in a mood boost. Thinking about bills (I.e "Can I pay all my bills/stay afloat?" has resulted in distress and worry for the patient. Combined with the stress and fatigue of chemo, patient had retreated from the world and was depressed. As treatment improves and patient learns skills, she will continue to feel better. Additionally, patient has identified deeper concerns that she would benefit from verbalizing and processing. Next session will explore deeper topics and work on gratitude.   Gaylyn Rong Counseling Intern

## 2020-03-10 ENCOUNTER — Ambulatory Visit: Payer: Medicare Other

## 2020-03-14 ENCOUNTER — Encounter: Payer: Self-pay | Admitting: *Deleted

## 2020-03-16 NOTE — Progress Notes (Signed)
[place] [facility] and [facility] Counseling Note  The patient reported having a busy week and that radiation would begin next week. The patient described some stressors at work and her desire to leave for another job. The patient described being lonely and both wanting and not wanting to make more social connections. The counselor suggested [employer] for arts and connection with others. The counselor and patient discussed the gratitude jar and prompts. The counselor provided psychoeducation, empathic listening, and reflections. The patient was oriented times three. Her mood was somewhat sad and stressed, with a congruent affect. She showed no signs of SI/HI/NSSI. The patient is adjusting to her new treatment, which has resulted in a mood boost. Thinking about bills (I.e "Can I pay all my bills/stay afloat?" has resulted in distress and worry for the patient. Combined with the stress and fatigue of chemo, patient had retreated from the world and was depressed. As treatment improves and patient learns skills, she will continue to feel better. Additionally, the patient has identified being lonely and could benefit from further exploration. Next session will discuss loneliness, gratitude, and radiation.   Gaylyn Rong Counseling Intern

## 2020-03-20 NOTE — Progress Notes (Signed)
Location of Breast Cancer: Malignant neoplasm of upper-outer quadrant of LEFT breast, estrogen receptor positive   Histology per Pathology Report:  10/07/2019 FINAL MICROSCOPIC DIAGNOSIS:  A. BREAST, LEFT, LUMPECTOMY:  - Invasive ductal carcinoma, Nottingham grade 2 of 3, 1.4 cm  - Ductal carcinoma in-situ, high grade  - Margins uninvolved by carcinoma (see parts C, D and E)  - Previous biopsy site changes present  - See oncology table and comment below  B. LYMPH NODE, LEFT AXILLARY, SENTINEL, EXCISION:  - Metastatic carcinoma involving one lymph node (1/1), 0.3 cm  - Focal extracapsular extension  C. BREAST, LEFT, ADDITIONAL MEDIAL MARGIN, EXCISION:  - Usual ductal hyperplasia  - No residual carcinoma identified  - See comment  D. BREAST, LEFT, ADDITIONAL INFERIOR MARGIN, EXCISION:  - No residual carcinoma identified  E. BREAST, LEFT, ADDITIONAL POSTERIOR MARGIN, EXCISION:  - No residual carcinoma identified  COMMENT:  A. The carcinoma has micropapillary features.  C.Cytokeratin 5/6 and E-cadherin are positive in the usual ductal hyperplasia.  Receptor Status: ER(100%), PR (40%), Her2-neu (Positive via FISH), Ki-67(20%)  Did patient present with symptoms (if so, please note symptoms) or was this found on screening mammography?:  Patient had a routine screening mammography on 08/03/2019 showing a possible abnormality in the left breast. She underwent left diagnostic mammography with tomography and left breast ultrasonography at [facility] on 08/17/2019 showing: breast density category B; 1.6 cm upper-outer left breast mass; no significant left axillary abnormalities  Past/Anticipated interventions by surgeon, if any: 10/07/2019 Dr. Rolm Bookbinder 1.leftbreast radioactive seed guided lumpectomy 2.leftdeep axillary sentinel lymph node biopsy   Past/Anticipated interventions by medical oncology, if any:  Under care of Dr. Sarajane Jews Magrinat 03/08/2020 (1) status  post left lumpectomy and axillary sentinel node sampling 10/07/2019 for a pT1c pN1, stage IB invasive ductal carcinoma, grade 2, with negative margins.             (a) a total of 1 sentinel lymph node was removed. (2) adjuvant chemotherapy consisting of carboplatin, gemcitabine, trastuzumab given every 3 weeks x 6 started 11/24/2019, last dose 01/26/2020             (a) baseline echo 09/15/2019 shows an ejection fraction in the 60-65% range.             (b) will not use docetaxel secondary to severe peripheral neuropathy present at baseline             (c) echo on 01/17/2020 shows EF of 60-65%             (d) chemotherapy discontinued after 4 cycles with poor tolerance. (3) T-DM1 started 02/16/2020, to be continued through November 2022             (a) echo (4) adjuvant radiation pending (5) antiestrogens to start at the completion of local treatment  Lymphedema issues, if any:  Patient denies, but reports she has another PT assessment on 04/10/20    Pain issues, if any:  Reports occasional breast/nipple pain, but states it resolves on its own. She also reports neck/shoulder pain, and right leg pain (5 out of 10 pain)  SAFETY ISSUES:  Prior radiation? Yes--vaginal brachytherapy completed in 2015 (living in [place] at the time)  Pacemaker/ICD? No  Possible current pregnancy? No--hysterectomy 2014  Is the patient on methotrexate? No  Current Complaints / other details:   Patient has not received any version of the COVID-19 vaccine

## 2020-03-20 NOTE — Progress Notes (Signed)
Radiation Oncology         [phone] ________________________________  Name: Felicia [name] MRN: [id number]  Date: 03/21/2020  DOB: November 15, 1950  Follow-Up Visit Note  Outpatient  CC: Kathyrn Lass, MD  Magrinat, Virgie Dad, MD  Diagnosis:      ICD-10-CM   1. Malignant neoplasm of upper-outer quadrant of left breast in female, estrogen receptor positive ([place])  C50.412    Z17.0    Cancer Staging Malignant neoplasm of upper-outer quadrant of left breast in female, estrogen receptor positive ([place]) Staging form: Breast, AJCC 8th Edition - Clinical stage from 09/08/2019: Stage IA (cT1c, cN0, cM0, G2, ER+, PR+, HER2+) - Unsigned Stage prefix: Initial diagnosis Histologic grading system: 3 grade system  pT1c pN1a  CHIEF COMPLAINT: Here to discuss management of left breast cancer  Narrative:  The patient returns today for follow-up. She was seen in consultation on 09/08/2019 and then in follow-up on 11/03/2019.   Since her last visit, she has not undergone any significant imaging studies.  Systemic therapy, if applicable, involved (dates and therapy as follows): Adjuvant chemotherapy with Carboplatin, Gemcitabine, and Trastuzumab from 11/24/2019 to 01/26/2020. Chemotherapy was discontinued after four cycles secondary to poor tolerance. T-DM1 was started on 02/16/2020 and is to be continued for 14 doses through November of 2022.  Symptomatically, the patient reports:   Lymphedema issues, if any:  Patient denies, but reports she has another PT assessment on 04/10/20    Pain issues, if any:  Reports occasional breast/nipple pain, but states it resolves on its own. She also reports neck/shoulder pain, and right anterior thigh/knee pain from arthritis (5 out of 10 pain)  SAFETY ISSUES:  Prior radiation? Yes--vaginal brachytherapy completed in 2015 (living in [place] at the time)  Pacemaker/ICD? No  Possible current pregnancy? No--hysterectomy 2014  Is the patient on  methotrexate? No  Current Complaints / other details:   Patient has not received any version of the COVID-19 vaccine. She works in a Production [employer], theatre/television/film.        ALLERGIES:  is allergic to chloraprep one step [chlorhexidine gluconate], estrogens, and shrimp [shellfish allergy].  Meds: Current Outpatient Medications  Medication Sig Dispense Refill  . butalbital-aspirin-caffeine (FIORINAL) 50-325-40 MG capsule Take 1 capsule by mouth 4 (four) times daily as needed.    Marland Kitchen ACCU-CHEK GUIDE test strip 1 each by Other route as needed.    . Ascorbic Acid (VITAMIN C PO) Take 1 tablet by mouth daily.    Marland Kitchen aspirin EC 81 MG tablet Take 81 mg by mouth daily.    Marland Kitchen atorvastatin (LIPITOR) 40 MG tablet TAKE 1 TABLET BY MOUTH EVERY DAY 90 tablet 1  . bisoprolol (ZEBETA) 5 MG tablet TAKE 1 TABLET BY MOUTH EVERY DAY 90 tablet 2  . Calcium Citrate-Vitamin D (CALCIUM + D PO) Take 1 tablet by mouth daily with supper.    Marland Kitchen CINNAMON PO Take 1 capsule by mouth daily with supper.    . diclofenac sodium (VOLTAREN) 1 % GEL Apply 2-4 g topically 4 (four) times daily as needed (as directed for pain).    Marland Kitchen diclofenac Sodium (VOLTAREN) 1 % GEL Apply 1 application topically 3 (three) times daily as needed.    . Dulaglutide (TRULICITY) 1.5 GY/6.9SW SOPN Inject 1.5 mg into the skin once a week.    . escitalopram (LEXAPRO) 10 MG tablet Take 10 mg by mouth daily.    . Ferrous Sulfate (IRON PO) Take 1 tablet by mouth daily. alternates days:1 tablet one day and 2  tablets the next day    . ferrous sulfate 325 (65 FE) MG EC tablet Take 325 mg by mouth 3 (three) times daily with meals.    . gabapentin (NEURONTIN) 300 MG capsule Take 1 capsule (300 mg total) by mouth at bedtime. 90 capsule 4  . GLIPIZIDE XL 10 MG 24 hr tablet Take 20 mg by mouth daily.   3  . JARDIANCE 25 MG TABS tablet Take 25 mg by mouth every morning.   3  . levothyroxine (SYNTHROID, LEVOTHROID) 125 MCG tablet Take 125 mcg by mouth daily before breakfast.    . metFORMIN  (GLUCOPHAGE) 500 MG tablet Take 500 mg by mouth 2 (two) times daily with a meal.    . metroNIDAZOLE (METROGEL) 1 % gel Apply 1 application topically as needed (as directed to affected area).     . Multiple Vitamins-Calcium (ONE-A-DAY WOMENS PO) Take 1 tablet by mouth daily.    . nitroGLYCERIN (NITROSTAT) 0.4 MG SL tablet Place 1 tablet (0.4 mg total) under the tongue every 5 (five) minutes as needed for chest pain. 25 tablet 6  . omeprazole (PRILOSEC) 40 MG capsule Take 40 mg by mouth daily with supper.     . prochlorperazine (COMPAZINE) 10 MG tablet Take 1 tablet by mouth before meals and at bedtime on days 2 and 3 after chemotherapy (counting chemotherapy day as day 1); after that may take as needed 30 tablet 1  . rOPINIRole (REQUIP) 0.5 MG tablet Take 0.5 mg by mouth at bedtime. 1-3 hours prior to bedtime    . tiZANidine (ZANAFLEX) 4 MG tablet Take 4 mg by mouth 3 (three) times daily. As needed    . TURMERIC PO Take 1 capsule by mouth daily with supper.     No current facility-administered medications for this encounter.    Physical Findings:  height is 5' 4"  (1.626 m) and weight is 171 lb 3.2 oz (77.7 kg). Her temperature is 98.2 F (36.8 C). Her blood pressure is 125/62 and her pulse is 74. Her respiration is 20 and oxygen saturation is 97%. .     General: Alert and oriented, in no acute distress Musculoskeletal: Good range of motion in left shoulder  Neurologic: No obvious focalities. Speech is fluent.  Psychiatric: Judgment and insight are intact. Affect is appropriate. Breast exam reveals satisfactory healing of lateral left breast lumpectomy scar and left axillary scar  Lab Findings: Lab Results  Component Value Date   WBC 11.0 (H) 03/08/2020   HGB 13.5 03/08/2020   HCT 41.2 03/08/2020   MCV 103.3 (H) 03/08/2020   PLT 258 03/08/2020     Radiographic Findings: No results found.  Impression/Plan: Left breast cancer  We discussed adjuvant radiotherapy today.  I recommend  radiation therapy to the left breast and regional lymph nodes over 6-week in order to reduce risk of local regional recurrence by two thirds.  I reviewed the logistics, benefits, risks, and potential side effects of this treatment in detail. Risks may include but not necessary be limited to acute and late injury tissue in the radiation fields such as skin irritation (change in color/pigmentation, itching, dryness, pain, peeling). She may experience fatigue. We also discussed possible risk of long term cosmetic changes or scar tissue. There is also a smaller risk for lung toxicity, cardiac toxicity, brachial plexopathy, lymphedema, musculoskeletal changes, rib fragility or induction of a second malignancy, late chronic non-healing soft tissue wound.    The patient asked good questions which I answered to her  satisfaction. She is enthusiastic about proceeding with treatment. A consent form has been previously signed and placed in her chart.  Today we will proceed with her CT simulation.  Anticipate starting her treatment next week  We discussed measures to reduce the risk of infection during the COVID-19 pandemic.  I again recommended that she get vaccinated to reduce her risk of significant morbidity from Covid.  She remains adamant that she does not want to get vaccinated.  However she knows to contact me if she changes her mind, we can provide a vaccination here in our clinic.  I look forward to participating in her care.  She knows that we will check in with one another approximately once a week as she goes through treatment.  On date of service, in total, I spent 30 minutes on this encounter. Patient was seen in person.  _____________________________________   Eppie Gibson, MD  This document serves as a record of services personally performed by Eppie Gibson, MD. It was created on his behalf by Clerance Lav, a trained medical scribe. The creation of this record is based on the scribe's personal  observations and the provider's statements to them. This document has been checked and approved by the attending provider.

## 2020-03-21 ENCOUNTER — Ambulatory Visit
Admission: RE | Admit: 2020-03-21 | Discharge: 2020-03-21 | Disposition: A | Discharge status: Home / Self Care | Payer: Medicare Other | Source: Ambulatory Visit | Attending: Radiation Oncology | Admitting: Radiation Oncology

## 2020-03-21 ENCOUNTER — Other Ambulatory Visit: Payer: Self-pay

## 2020-03-21 ENCOUNTER — Encounter: Payer: Self-pay | Admitting: Radiation Oncology

## 2020-03-21 VITALS — BP 125/62 | HR 74 | Temp 98.2°F | Resp 20 | Ht 64.0 in | Wt 171.2 lb

## 2020-03-21 DIAGNOSIS — C50412 Malignant neoplasm of upper-outer quadrant of left female breast: Secondary | ICD-10-CM | POA: Diagnosis not present

## 2020-03-21 DIAGNOSIS — Z7982 Long term (current) use of aspirin: Secondary | ICD-10-CM | POA: Diagnosis not present

## 2020-03-21 DIAGNOSIS — Z170 Estrogen receptor positive status [ER+]: Secondary | ICD-10-CM

## 2020-03-21 DIAGNOSIS — Z79899 Other long term (current) drug therapy: Secondary | ICD-10-CM | POA: Diagnosis not present

## 2020-03-21 DIAGNOSIS — Z791 Long term (current) use of non-steroidal anti-inflammatories (NSAID): Secondary | ICD-10-CM | POA: Insufficient documentation

## 2020-03-21 DIAGNOSIS — Z510 Encounter for antineoplastic radiation therapy: Secondary | ICD-10-CM | POA: Diagnosis not present

## 2020-03-24 DIAGNOSIS — Z510 Encounter for antineoplastic radiation therapy: Secondary | ICD-10-CM | POA: Diagnosis not present

## 2020-03-24 DIAGNOSIS — M24811 Other specific joint derangements of right shoulder, not elsewhere classified: Secondary | ICD-10-CM | POA: Diagnosis not present

## 2020-03-24 DIAGNOSIS — M25511 Pain in right shoulder: Secondary | ICD-10-CM | POA: Diagnosis not present

## 2020-03-24 DIAGNOSIS — Z170 Estrogen receptor positive status [ER+]: Secondary | ICD-10-CM | POA: Diagnosis not present

## 2020-03-24 DIAGNOSIS — M67911 Unspecified disorder of synovium and tendon, right shoulder: Secondary | ICD-10-CM | POA: Diagnosis not present

## 2020-03-24 DIAGNOSIS — C50412 Malignant neoplasm of upper-outer quadrant of left female breast: Secondary | ICD-10-CM | POA: Diagnosis not present

## 2020-03-27 ENCOUNTER — Encounter: Payer: Self-pay | Admitting: *Deleted

## 2020-03-28 NOTE — Progress Notes (Signed)
[place]  Telephone:([phone]) [phone] [phone]) [phone]     ID: Felicia [name] DOB: 1950-03-29  MR#: [id number]  TDH#:[id number]  Patient Care Team: Kathyrn Lass, MD as PCP - General (Family Medicine) Nahser, [name], MD as PCP - Cardiology (Cardiology) Mauro Kaufmann, RN as Oncology Nurse Navigator Rockwell Germany, RN as Oncology Nurse Navigator Rolm Bookbinder, MD as Consulting Physician (General Surgery) Elwin Tsou, Virgie Dad, MD as Consulting Physician (Oncology) Eppie Gibson, MD as Attending Physician (Radiation Oncology) [name], MD as Consulting Physician (Rheumatology) [name], MD as Consulting Physician (Orthopedic Surgery) Janyth Pupa, DO as Consulting Physician (Obstetrics and Gynecology) Larey Dresser, MD as Consulting Physician (Cardiology) Madelin Rear, MD as Consulting Physician (Endocrinology) Chauncey Cruel, MD OTHER MD:  CHIEF COMPLAINT: Triple positive invasive breast cancer  CURRENT TREATMENT: T-DM1   INTERVAL HISTORY: Felicia [name] returns today for follow up and treatment of her triple positive invasive breast cancer.  She is accompanied by her daughter [name] who will be driving her home.  She was switched to T-DM1 at her last visit on 02/16/2020.  She is tolerating this much better.  She is receiving lorazepam with her treatment and does not have the restless legs problems she was having before.  Her most recent echocardiogram from 01/17/2020 showed a normal EF of 60-65%.  Since her last visit, she met back with Dr. Isidore Moos on 03/21/2020 to review radiation therapy.  She had difficulty getting into position for treatment.  Since that time she saw Ortho, I believe it was Dr. Rip Harbour who arranged for her to have 2 cortisone shots in the right shoulder and now she is able to receive her radiation.  She is scheduled to receive treatment starting tomorrow, 03/30/2020, through 05/10/2020.   REVIEW OF SYSTEMS: Alyana was hoping  to get a different job at [employer] but it was given to another person.  She tells me that person has quit.  She still wants a job and we discussed strategies she can pursue regarding that.  She does not get excessively confused or sleepy when she receives lorazepam pretreatment but I do want her to be driven home by someone each time just in case.  She is in agreement with this.  She tells me she has been having headaches for weeks and weeks.  I do not recall her mentioning this before.  She says she has tried to take everything over-the-counter to relieve it and it helps a little but then the problem comes back.  She has not been having visual changes, nausea or vomiting, but she does feel a little bit of a balance problem and today she is using a cane.  Aside from that a detailed review of systems today was stable   COVID 19 VACCINATION STATUS: Refuses vaccination   HISTORY OF CURRENT ILLNESS: From the original intake note:  [name] had routine screening mammography on 08/03/2019 showing a possible abnormality in the left breast. She underwent left diagnostic mammography with tomography and left breast ultrasonography at [facility] on 08/17/2019 showing: breast density category B; 1.6 cm upper-outer left breast mass; no significant left axillary abnormalities.  Accordingly on 08/30/2019 she proceeded to biopsy of the left breast area in question. The pathology from this procedure ([id number]) showed: invasive mammary carcinoma, e-cadherin positive, grade 2. Prognostic indicators significant for: estrogen receptor, 100% positive and progesterone receptor, 40% positive, both with strong staining intensity. Proliferation marker Ki67 at 20%. HER2 equivocal by immunohistochemistry (2+), but negative by fluorescent in  situ hybridization with a signals ratio   and number per cell  .  Of note, she also has a history of FIGO grade 3, stage IA endometrial carcinoma, diagnosed in 2014 in [place]. She  underwent robotic assisted total hysterectomy with BSO and pelvic and perirectal aortic lymph node dissection as well as omental biopsy.  Tumor was found only in the endometrium arising in a polyp measuring 1.7 cm.  There was no myometrial invasion no lymphovascular space invasion, and no lower uterine segment involvement.  The cervix and adnexa, lymph nodes and omentum were all not involved.  She then received vaginal brachytherapy, no chemotherapy, completing treatment in the springtime of 2015. [Per patient's report, chemotherapy was suggested, but she opted against this.]  The patient's subsequent history is as detailed below.   PAST MEDICAL HISTORY: Past Medical History:  Diagnosis Date   Anemia    Anxiety    Breast cancer ([place]) 09/2019   left breast IMC   CAD (coronary artery disease)    a. 3/2007s/p DES to the LAD [facility]); b. 01/2017 MV: EF 80%, small, mild apical ant defect w/o ischemia (felt to be breast atten). Low risk.   Depression    DM (diabetes mellitus) ([place])    Essential hypertension    GERD (gastroesophageal reflux disease)    Headache    secondary to neck surgery per patient   High triglycerides    Hyperlipidemia    Hypothyroid    OA (osteoarthritis)    right knee,hands   Overflow incontinence    PONV (postoperative nausea and vomiting)    s/p gallbladder    RLS (restless legs syndrome)    Urinary urgency    Uterine cancer ([place]) dx'd 2014    PAST SURGICAL HISTORY: Past Surgical History:  Procedure Laterality Date   BREAST LUMPECTOMY WITH RADIOACTIVE SEED AND SENTINEL LYMPH NODE BIOPSY Left 10/07/2019   Procedure: LEFT BREAST LUMPECTOMY WITH RADIOACTIVE SEED AND SENTINEL LYMPH NODE BIOPSY;  Surgeon: Rolm Bookbinder, MD;  Location: [facility];  Service: General;  Laterality: Left;   CORONARY STENT PLACEMENT     CORONARY/GRAFT ACUTE MI REVASCULARIZATION     FINGER ARTHRODESIS Right 06/11/2019   Procedure:  ARTHRODESIS INDEX FINGER DISTAL PHALANGEAL JOINT;  Surgeon: [name], MD;  Location: [facility];  Service: Orthopedics;  Laterality: Right;   FOOT SURGERY Left    LEFT HEART CATH AND CORONARY ANGIOGRAPHY N/A 02/16/2018   Procedure: LEFT HEART CATH AND CORONARY ANGIOGRAPHY;  Surgeon: Lorretta Harp, MD;  Location: [facility];  Service: Cardiovascular;  Laterality: N/A;   PORTACATH PLACEMENT Right 11/23/2019   Procedure: INSERTION PORT-A-CATH WITH ULTRASOUND GUIDANCE;  Surgeon: Rolm Bookbinder, MD;  Location: [facility];  Service: General;  Laterality: Right;   REPLACEMENT TOTAL KNEE Right    SPINE SURGERY     TOTAL ABDOMINAL HYSTERECTOMY     FOR UTERUS CANCER   TOTAL HIP ARTHROPLASTY Left     FAMILY HISTORY: Family History  Problem Relation Age of Onset   AAA (abdominal aortic aneurysm) Mother    Diabetes Father    Alzheimer's disease Father    Throat cancer Sister    Breast cancer Cousin    Her father died at age 64 from Alzheimer's. Her mother died at age 58 from a ruptured abdominal aneurysm. Laelle has 2 brothers and 3 sisters. She reports breast cancer in a paternal cousin. She also notes throat cancer in her sister.   GYNECOLOGIC  HISTORY:  No LMP recorded. Patient has had a hysterectomy. Menarche: 70 years old Age at first live birth: 70 years old [place] P 2 LMP 1996 Contraceptive: prior use without issue HRT never used  Hysterectomy? Yes, 2014 BSO? 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  She unpacks 40 pounds boxes of chicken, has to cook them in 13 pound cooking pans, and is generally on her feet at work but does not otherwise exercise.  She is divorced. She lives at home by herself, with no pets. Daughter Pershing Proud, age 45, is a radiology tech in [place]. Daughter Martyn Malay, age 2, is an Optometrist in [place], [place]. Denym has one grandchild. She is  not a Ambulance person.    ADVANCED DIRECTIVES: Not in place; intends to name daughter [name] as her HCPOA.   HEALTH MAINTENANCE: Social History   Tobacco Use   Smoking status: Never Smoker   Smokeless tobacco: Never Used  Vaping Use   Vaping Use: Never used  Substance Use Topics   Alcohol use: Yes    Comment: OCCASIONALLY   Drug use: No     [place]onoscopy: <10 years ago, [place]  PAP: 2014?  Bone density: date unsure, also in [place]   Allergies  Allergen Reactions   Chloraprep One Step [Chlorhexidine Gluconate] Rash   Estrogens Other (See Comments)    PATIENT HAS A HISTORY OF CANCER AND HAS BEEN TOLD TO NEVER TAKE ANYTHING CONTAINING ESTROGEN, AS IT MIGHT CAUSE A RECURRENCE   Shrimp [Shellfish Allergy] Nausea And Vomiting    Current Outpatient Medications  Medication Sig Dispense Refill   ACCU-CHEK GUIDE test strip 1 each by Other route as needed.     Ascorbic Acid (VITAMIN C PO) Take 1 tablet by mouth daily.     aspirin EC 81 MG tablet Take 81 mg by mouth daily.     atorvastatin (LIPITOR) 40 MG tablet TAKE 1 TABLET BY MOUTH EVERY DAY 90 tablet 1   bisoprolol (ZEBETA) 5 MG tablet TAKE 1 TABLET BY MOUTH EVERY DAY 90 tablet 2   butalbital-aspirin-caffeine (FIORINAL) 50-325-40 MG capsule Take 1 capsule by mouth 4 (four) times daily as needed.     Calcium Citrate-Vitamin D (CALCIUM + D PO) Take 1 tablet by mouth daily with supper.     CINNAMON PO Take 1 capsule by mouth daily with supper.     diclofenac sodium (VOLTAREN) 1 % GEL Apply 2-4 g topically 4 (four) times daily as needed (as directed for pain).     diclofenac Sodium (VOLTAREN) 1 % GEL Apply 1 application topically 3 (three) times daily as needed.     Dulaglutide (TRULICITY) 1.5 TI/1.4ER SOPN Inject 1.5 mg into the skin once a week.     escitalopram (LEXAPRO) 10 MG tablet Take 10 mg by mouth daily.     Ferrous Sulfate (IRON PO) Take 1 tablet by mouth daily. alternates days:1 tablet one day and 2  tablets the next day     ferrous sulfate 325 (65 FE) MG EC tablet Take 325 mg by mouth 3 (three) times daily with meals.     gabapentin (NEURONTIN) 300 MG capsule Take 1 capsule (300 mg total) by mouth at bedtime. 90 capsule 4   GLIPIZIDE XL 10 MG 24 hr tablet Take 20 mg by mouth daily.   3   JARDIANCE 25 MG TABS tablet Take 25 mg by mouth every morning.   3   levothyroxine (  SYNTHROID, LEVOTHROID) 125 MCG tablet Take 125 mcg by mouth daily before breakfast.     metFORMIN (GLUCOPHAGE) 500 MG tablet Take 500 mg by mouth 2 (two) times daily with a meal.     metroNIDAZOLE (METROGEL) 1 % gel Apply 1 application topically as needed (as directed to affected area).      Multiple Vitamins-Calcium (ONE-A-DAY WOMENS PO) Take 1 tablet by mouth daily.     nitroGLYCERIN (NITROSTAT) 0.4 MG SL tablet Place 1 tablet (0.4 mg total) under the tongue every 5 (five) minutes as needed for chest pain. 25 tablet 6   omeprazole (PRILOSEC) 40 MG capsule Take 40 mg by mouth daily with supper.      prochlorperazine (COMPAZINE) 10 MG tablet Take 1 tablet by mouth before meals and at bedtime on days 2 and 3 after chemotherapy (counting chemotherapy day as day 1); after that may take as needed 30 tablet 1   rOPINIRole (REQUIP) 0.5 MG tablet Take 0.5 mg by mouth at bedtime. 1-3 hours prior to bedtime     tiZANidine (ZANAFLEX) 4 MG tablet Take 4 mg by mouth 3 (three) times daily. As needed     TURMERIC PO Take 1 capsule by mouth daily with supper.     No current facility-administered medications for this visit.    OBJECTIVE: White woman who appears stated age  12:   03/29/20 1229  BP: 120/63  Pulse: 71  Resp: 18  Temp: (!) 97.5 F (36.4 C)  SpO2: 96%     Body mass index is 28.94 kg/m.   Wt Readings from Last 3 Encounters:  03/29/20 168 lb 9.6 oz (76.5 kg)  03/21/20 171 lb 3.2 oz (77.7 kg)  03/08/20 171 lb 11.2 oz (77.9 kg)      ECOG FS:1 - Symptomatic but completely ambulatory  Sclerae  unicteric, EOMs intact Wearing a mask No cervical or supraclavicular adenopathy Lungs no rales or rhonchi Heart regular rate and rhythm Abd soft, nontender, positive bowel sounds MSK no focal spinal tenderness, no upper extremity lymphedema; she is using a cane today Neuro: nonfocal, well oriented, appropriate affect Breasts: Deferred   LAB RESULTS:  CMP     Component Value Date/Time   NA 137 03/08/2020 0913   NA 139 11/25/2018 1053   K 3.8 03/08/2020 0913   CL 104 03/08/2020 0913   CO2 23 03/08/2020 0913   GLUCOSE 214 (H) 03/08/2020 0913   BUN 10 03/08/2020 0913   BUN 18 11/25/2018 1053   CREATININE 0.71 03/08/2020 0913   CALCIUM 10.1 03/08/2020 0913   PROT 7.6 03/08/2020 0913   PROT 7.1 11/25/2018 1053   ALBUMIN 4.1 03/08/2020 0913   ALBUMIN 4.2 11/25/2018 1053   AST 29 03/08/2020 0913   ALT 39 03/08/2020 0913   ALKPHOS 75 03/08/2020 0913   BILITOT 0.3 03/08/2020 0913   GFRNONAA >60 03/08/2020 0913   GFRAA >60 10/04/2019 1330   GFRAA >60 09/08/2019 0811    No results found for: TOTALPROTELP, ALBUMINELP, A1GS, A2GS, BETS, BETA2SER, GAMS, MSPIKE, SPEI  Lab Results  Component Value Date   WBC 11.1 (H) 03/29/2020   NEUTROABS 3.6 03/29/2020   HGB 13.9 03/29/2020   HCT 43.3 03/29/2020   MCV 103.3 (H) 03/29/2020   PLT 227 03/29/2020    No results found for: LABCA2  No components found for: TGGYIR485  No results for input(s): INR in the last 168 hours.  No results found for: LABCA2  No results found for: IOE703  No results found  for: DZH299  No results found for: MEQ683  No results found for: CA2729  No components found for: HGQUANT  No results found for: CEA1 / No results found for: CEA1   No results found for: AFPTUMOR  No results found for: CHROMOGRNA  No results found for: KPAFRELGTCHN, LAMBDASER, KAPLAMBRATIO (kappa/lambda light chains)  No results found for: HGBA, HGBA2QUANT, HGBFQUANT, HGBSQUAN (Hemoglobinopathy evaluation)   No  results found for: LDH  No results found for: IRON, TIBC, IRONPCTSAT (Iron and TIBC)  No results found for: FERRITIN  Urinalysis No results found for: COLORURINE, APPEARANCEUR, LABSPEC, PHURINE, GLUCOSEU, HGBUR, BILIRUBINUR, KETONESUR, PROTEINUR, UROBILINOGEN, NITRITE, LEUKOCYTESUR   STUDIES: No results found.   ELIGIBLE FOR AVAILABLE RESEARCH PROTOCOL: AET  ASSESSMENT: 70 y.o. [place] woman status post left breast upper outer quadrant biopsy 08/30/2019 for a clinical T1c N0, stage IA invasive ductal carcinoma, grade 2, estrogen and progesterone receptor positive, HER-2 amplified, with an MIB-1 of 20%.  (0) status post TAH/BSO 2014 for a FIGO grade 3, stage IA endometrial carcinoma, status post vaginal brachytherapy, no adjuvant chemotherapy  (1) status post left lumpectomy and axillary sentinel node sampling 10/07/2019 for a pT1c pN1, stage IB invasive ductal carcinoma, grade 2, with negative margins.  (a) a total of 1 sentinel lymph node was removed.  (2) adjuvant chemotherapy consisting of carboplatin, gemcitabine, trastuzumab given every 3 weeks x 6 started 11/24/2019, last dose 01/26/2020  (a) baseline echo 09/15/2019 shows an ejection fraction in the 60-65% range.  (b) will not use docetaxel secondary to severe peripheral neuropathy present at baseline  (c) echo on 01/17/2020 shows EF of 60-65%  (d) chemotherapy discontinued after 4 cycles with poor tolerance.  (3) T-DM1 started 02/16/2020, to be continued through November 2022  (a) echo  (4) adjuvant radiation pending  (5) antiestrogens to start at the completion of local treatment   PLAN: Tiaja is tolerating T-DM1 well and the plan is to continue that for a total of 14 doses.  She will need a repeat echocardiogram early April.  She is starting radiation tomorrow.  I am going to see her in the treatment area 3 weeks from now, with her next Kadcyla dose, just to make sure there are no complications.  I am  concerned about her persistent headaches.  I have put her in for a brain MRI to be done in the next week.  Otherwise she knows to call for any other issue that may develop before the next visit  Total encounter time 25 minutes.Sarajane Jews C. Cayle Cordoba, MD 03/29/20 12:42 PM Medical Oncology and Hematology [facility] [place], [place] [address] Tel. [phone]    Fax. [phone]   I, Wilburn Mylar, am acting as scribe for Dr. Virgie Dad. Milany Geck.  I, Lurline Del MD, have reviewed the above documentation for accuracy and completeness, and I agree with the above.   *Total Encounter Time as defined by the Centers for Medicare and Medicaid Services includes, in addition to the face-to-face time of a patient visit (documented in the note above) non-face-to-face time: obtaining and reviewing outside history, ordering and reviewing medications, tests or procedures, care coordination (communications with other health care professionals or caregivers) and documentation in the medical record.

## 2020-03-29 ENCOUNTER — Inpatient Hospital Stay: Payer: Medicare Other

## 2020-03-29 ENCOUNTER — Inpatient Hospital Stay: Payer: Medicare Other | Admitting: Oncology

## 2020-03-29 ENCOUNTER — Other Ambulatory Visit: Payer: Self-pay

## 2020-03-29 ENCOUNTER — Ambulatory Visit: Payer: Medicare Other | Admitting: Radiation Oncology

## 2020-03-29 ENCOUNTER — Telehealth: Payer: Self-pay | Admitting: Oncology

## 2020-03-29 ENCOUNTER — Inpatient Hospital Stay: Payer: Medicare Other | Attending: Oncology

## 2020-03-29 VITALS — BP 120/63 | HR 71 | Temp 97.5°F | Resp 18 | Ht 64.0 in | Wt 168.6 lb

## 2020-03-29 DIAGNOSIS — Z833 Family history of diabetes mellitus: Secondary | ICD-10-CM | POA: Diagnosis not present

## 2020-03-29 DIAGNOSIS — E785 Hyperlipidemia, unspecified: Secondary | ICD-10-CM | POA: Insufficient documentation

## 2020-03-29 DIAGNOSIS — Z90722 Acquired absence of ovaries, bilateral: Secondary | ICD-10-CM | POA: Diagnosis not present

## 2020-03-29 DIAGNOSIS — Z803 Family history of malignant neoplasm of breast: Secondary | ICD-10-CM | POA: Diagnosis not present

## 2020-03-29 DIAGNOSIS — Z170 Estrogen receptor positive status [ER+]: Secondary | ICD-10-CM

## 2020-03-29 DIAGNOSIS — C541 Malignant neoplasm of endometrium: Secondary | ICD-10-CM

## 2020-03-29 DIAGNOSIS — Z808 Family history of malignant neoplasm of other organs or systems: Secondary | ICD-10-CM | POA: Diagnosis not present

## 2020-03-29 DIAGNOSIS — E109 Type 1 diabetes mellitus without complications: Secondary | ICD-10-CM | POA: Diagnosis not present

## 2020-03-29 DIAGNOSIS — Z5112 Encounter for antineoplastic immunotherapy: Secondary | ICD-10-CM | POA: Insufficient documentation

## 2020-03-29 DIAGNOSIS — C50412 Malignant neoplasm of upper-outer quadrant of left female breast: Secondary | ICD-10-CM

## 2020-03-29 DIAGNOSIS — Z79899 Other long term (current) drug therapy: Secondary | ICD-10-CM | POA: Insufficient documentation

## 2020-03-29 DIAGNOSIS — Z8249 Family history of ischemic heart disease and other diseases of the circulatory system: Secondary | ICD-10-CM | POA: Diagnosis not present

## 2020-03-29 DIAGNOSIS — R519 Headache, unspecified: Secondary | ICD-10-CM | POA: Diagnosis not present

## 2020-03-29 DIAGNOSIS — I2510 Atherosclerotic heart disease of native coronary artery without angina pectoris: Secondary | ICD-10-CM | POA: Insufficient documentation

## 2020-03-29 DIAGNOSIS — Z818 Family history of other mental and behavioral disorders: Secondary | ICD-10-CM | POA: Insufficient documentation

## 2020-03-29 DIAGNOSIS — G629 Polyneuropathy, unspecified: Secondary | ICD-10-CM | POA: Diagnosis not present

## 2020-03-29 DIAGNOSIS — Z95828 Presence of other vascular implants and grafts: Secondary | ICD-10-CM

## 2020-03-29 LAB — CBC WITH DIFFERENTIAL (CANCER CENTER ONLY)
Abs Immature Granulocytes: 0.03 10*3/uL (ref 0.00–0.07)
Basophils Absolute: 0 10*3/uL (ref 0.0–0.1)
Basophils Relative: 0 %
Eosinophils Absolute: 0.1 10*3/uL (ref 0.0–0.5)
Eosinophils Relative: 1 %
HCT: 43.3 % (ref 36.0–46.0)
Hemoglobin: 13.9 g/dL (ref 12.0–15.0)
Immature Granulocytes: 0 %
Lymphocytes Relative: 60 %
Lymphs Abs: 6.7 10*3/uL — ABNORMAL HIGH (ref 0.7–4.0)
MCH: 33.2 pg (ref 26.0–34.0)
MCHC: 32.1 g/dL (ref 30.0–36.0)
MCV: 103.3 fL — ABNORMAL HIGH (ref 80.0–100.0)
Monocytes Absolute: 0.6 10*3/uL (ref 0.1–1.0)
Monocytes Relative: 6 %
Neutro Abs: 3.6 10*3/uL (ref 1.7–7.7)
Neutrophils Relative %: 33 %
Platelet Count: 227 10*3/uL (ref 150–400)
RBC: 4.19 MIL/uL (ref 3.87–5.11)
RDW: 14.9 % (ref 11.5–15.5)
WBC Count: 11.1 10*3/uL — ABNORMAL HIGH (ref 4.0–10.5)
nRBC: 0 % (ref 0.0–0.2)

## 2020-03-29 LAB — CMP (CANCER CENTER ONLY)
ALT: 37 U/L (ref 0–44)
AST: 27 U/L (ref 15–41)
Albumin: 3.9 g/dL (ref 3.5–5.0)
Alkaline Phosphatase: 77 U/L (ref 38–126)
Anion gap: 12 (ref 5–15)
BUN: 16 mg/dL (ref 8–23)
CO2: 25 mmol/L (ref 22–32)
Calcium: 9.8 mg/dL (ref 8.9–10.3)
Chloride: 102 mmol/L (ref 98–111)
Creatinine: 0.81 mg/dL (ref 0.44–1.00)
GFR, Estimated: >60 mL/min (ref >60)
Glucose, Bld: 338 mg/dL — ABNORMAL HIGH (ref 70–99)
Potassium: 3.9 mmol/L (ref 3.5–5.1)
Sodium: 139 mmol/L (ref 135–145)
Total Bilirubin: 0.4 mg/dL (ref 0.3–1.2)
Total Protein: 7.4 g/dL (ref 6.5–8.1)

## 2020-03-29 MED ORDER — SODIUM CHLORIDE 0.9% FLUSH
10.0000 mL | INTRAVENOUS | Status: DC | PRN
  Administered 2020-03-29: 10 mL
  Filled 2020-03-29: qty 10

## 2020-03-29 MED ORDER — HEPARIN SOD (PORK) LOCK FLUSH 100 UNIT/ML IV SOLN
500.0000 [IU] | Freq: Once | INTRAVENOUS | Status: AC | PRN
Start: 2020-03-29 — End: 2020-03-29
  Administered 2020-03-29: 500 [IU]
  Filled 2020-03-29: qty 5

## 2020-03-29 MED ORDER — LORAZEPAM 2 MG/ML IJ SOLN
0.5000 mg | Freq: Once | INTRAMUSCULAR | Status: AC
  Administered 2020-03-29: 0.5 mg via INTRAVENOUS

## 2020-03-29 MED ORDER — SODIUM CHLORIDE 0.9% FLUSH
10.0000 mL | Freq: Once | INTRAVENOUS | Status: AC
  Administered 2020-03-29: 10 mL
  Filled 2020-03-29: qty 10

## 2020-03-29 MED ORDER — ACETAMINOPHEN 325 MG PO TABS
ORAL_TABLET | ORAL | Status: AC
  Filled 2020-03-29: qty 2

## 2020-03-29 MED ORDER — SODIUM CHLORIDE 0.9 % IV SOLN
3.6000 mg/kg | Freq: Once | INTRAVENOUS | Status: AC
  Administered 2020-03-29: 280 mg via INTRAVENOUS
  Filled 2020-03-29: qty 14

## 2020-03-29 MED ORDER — SODIUM CHLORIDE 0.9 % IV SOLN
Freq: Once | INTRAVENOUS | Status: AC
  Filled 2020-03-29: qty 250

## 2020-03-29 MED ORDER — ACETAMINOPHEN 325 MG PO TABS
650.0000 mg | ORAL_TABLET | Freq: Once | ORAL | Status: AC
  Administered 2020-03-29: 650 mg via ORAL

## 2020-03-29 MED ORDER — DIPHENHYDRAMINE HCL 25 MG PO CAPS
25.0000 mg | ORAL_CAPSULE | Freq: Once | ORAL | Status: AC
  Administered 2020-03-29: 25 mg via ORAL

## 2020-03-29 MED ORDER — DIPHENHYDRAMINE HCL 25 MG PO CAPS
ORAL_CAPSULE | ORAL | Status: AC
  Filled 2020-03-29: qty 1

## 2020-03-29 MED ORDER — LORAZEPAM 2 MG/ML IJ SOLN
INTRAMUSCULAR | Status: AC
  Filled 2020-03-29: qty 1

## 2020-03-29 NOTE — Telephone Encounter (Signed)
Scheduled appointments per 3/16 los. Spoke to patient who is aware of appointments dates and times. Gave patient calendar print out.

## 2020-03-29 NOTE — Patient Instructions (Signed)
[facility] Discharge Instructions for Patients Receiving Chemotherapy  Today you received the following chemotherapy agents Kadcyla  To help prevent nausea and vomiting after your treatment, we encourage you to take your nausea medication as directed   If you develop nausea and vomiting that is not controlled by your nausea medication, call the clinic.   BELOW ARE SYMPTOMS THAT SHOULD BE REPORTED IMMEDIATELY:  *FEVER GREATER THAN 100.5 F  *CHILLS WITH OR WITHOUT FEVER  NAUSEA AND VOMITING THAT IS NOT CONTROLLED WITH YOUR NAUSEA MEDICATION  *UNUSUAL SHORTNESS OF BREATH  *UNUSUAL BRUISING OR BLEEDING  TENDERNESS IN MOUTH AND THROAT WITH OR WITHOUT PRESENCE OF ULCERS  *URINARY PROBLEMS  *BOWEL PROBLEMS  UNUSUAL RASH Items with * indicate a potential emergency and should be followed up as soon as possible.  Feel free to call the clinic should you have any questions or concerns. The clinic phone number is [phone].  Please show the CHEMO ALERT CARD at check-in to the Emergency Department and triage nurse.   

## 2020-03-30 ENCOUNTER — Ambulatory Visit
Admission: RE | Admit: 2020-03-30 | Discharge: 2020-03-30 | Disposition: A | Discharge status: Home / Self Care | Payer: Medicare Other | Source: Ambulatory Visit | Attending: Radiation Oncology | Admitting: Radiation Oncology

## 2020-03-30 DIAGNOSIS — Z170 Estrogen receptor positive status [ER+]: Secondary | ICD-10-CM | POA: Diagnosis not present

## 2020-03-30 DIAGNOSIS — C50412 Malignant neoplasm of upper-outer quadrant of left female breast: Secondary | ICD-10-CM | POA: Diagnosis not present

## 2020-03-30 DIAGNOSIS — Z510 Encounter for antineoplastic radiation therapy: Secondary | ICD-10-CM | POA: Diagnosis not present

## 2020-03-30 NOTE — Progress Notes (Signed)
[facility] Counseling Note  The patient reported on her chemo treatment yesterday and how she was feeling. The patient and counselor discussed a potential suitor, radiation, family, work, and feeling better. The patient expressed strong values in session, as well as some emotional responses to what was going on in her life. The counselor provided empathic listening, psychoeducation, validation of feelings, and reflections. The patient was oriented times three and in a tired, happier mood. Her affect was congruent. No signs of SI/HI/NSSI were present. The patient is adjusting to her new treatment, which has resulted in a mood boost. Thinking about bills (I.e "Can I pay all my bills/stay afloat?" has resulted in distress and worry for the patient. Combined with the stress and fatigue of chemo, patient had retreated from the world and was depressed. As treatment improves and patient learns skills, she will continue to feel better. Patient reported feeling better today, being less "down." Next session will discuss signs of feeling "down" and what to do when that occurs.   Gaylyn Rong Counseling Intern

## 2020-03-31 ENCOUNTER — Ambulatory Visit
Admission: RE | Admit: 2020-03-31 | Discharge: 2020-03-31 | Disposition: A | Discharge status: Home / Self Care | Payer: Medicare Other | Source: Ambulatory Visit | Attending: Radiation Oncology | Admitting: Radiation Oncology

## 2020-03-31 DIAGNOSIS — C50412 Malignant neoplasm of upper-outer quadrant of left female breast: Secondary | ICD-10-CM | POA: Diagnosis not present

## 2020-03-31 DIAGNOSIS — Z510 Encounter for antineoplastic radiation therapy: Secondary | ICD-10-CM | POA: Diagnosis not present

## 2020-03-31 DIAGNOSIS — Z170 Estrogen receptor positive status [ER+]: Secondary | ICD-10-CM | POA: Diagnosis not present

## 2020-04-02 ENCOUNTER — Other Ambulatory Visit: Payer: Self-pay | Admitting: Cardiovascular Disease

## 2020-04-03 ENCOUNTER — Ambulatory Visit
Admission: RE | Admit: 2020-04-03 | Discharge: 2020-04-03 | Disposition: A | Discharge status: Home / Self Care | Payer: Medicare Other | Source: Ambulatory Visit | Attending: Radiation Oncology | Admitting: Radiation Oncology

## 2020-04-03 ENCOUNTER — Other Ambulatory Visit: Payer: Self-pay

## 2020-04-03 DIAGNOSIS — Z170 Estrogen receptor positive status [ER+]: Secondary | ICD-10-CM | POA: Diagnosis not present

## 2020-04-03 DIAGNOSIS — Z510 Encounter for antineoplastic radiation therapy: Secondary | ICD-10-CM | POA: Diagnosis not present

## 2020-04-03 DIAGNOSIS — C50412 Malignant neoplasm of upper-outer quadrant of left female breast: Secondary | ICD-10-CM | POA: Diagnosis not present

## 2020-04-03 MED ORDER — ALRA NON-METALLIC DEODORANT (RAD-ONC)
1.0000 "application " | Freq: Once | TOPICAL | Status: AC
  Administered 2020-04-03: 1 via TOPICAL

## 2020-04-03 MED ORDER — RADIAPLEXRX EX GEL: Freq: Once | CUTANEOUS | Status: AC

## 2020-04-03 NOTE — Addendum Note (Signed)
Encounter addended by: Zola Button, RN on: 04/03/2020 2:23 PM  Actions taken: Clinical Note Signed

## 2020-04-03 NOTE — Progress Notes (Signed)
Pt here for patient teaching.  Completed by Donna McIntyre-RN  Pt given Radiation and You booklet, skin care instructions, Alra deodorant and Radiaplex gel.    Reviewed areas of pertinence such as fatigue, hair loss, skin changes, breast tenderness and breast swelling .   Pt able to give teach back of to pat skin, use unscented/gentle soap and drink plenty of water,apply Radiaplex bid, avoid applying anything to skin within 4 hours of treatment, avoid wearing an under wire bra and to use an electric razor if they must shave.   Pt demonstrated understanding and verbalizes understanding of information given and will contact nursing with any questions or concerns.    [email or web address]          

## 2020-04-04 ENCOUNTER — Ambulatory Visit
Admission: RE | Admit: 2020-04-04 | Discharge: 2020-04-04 | Disposition: A | Discharge status: Home / Self Care | Payer: Medicare Other | Source: Ambulatory Visit | Attending: Radiation Oncology | Admitting: Radiation Oncology

## 2020-04-04 DIAGNOSIS — C50412 Malignant neoplasm of upper-outer quadrant of left female breast: Secondary | ICD-10-CM | POA: Diagnosis not present

## 2020-04-04 DIAGNOSIS — Z510 Encounter for antineoplastic radiation therapy: Secondary | ICD-10-CM | POA: Diagnosis not present

## 2020-04-04 DIAGNOSIS — Z170 Estrogen receptor positive status [ER+]: Secondary | ICD-10-CM | POA: Diagnosis not present

## 2020-04-05 ENCOUNTER — Ambulatory Visit: Payer: Medicare Other

## 2020-04-05 DIAGNOSIS — E119 Type 2 diabetes mellitus without complications: Secondary | ICD-10-CM | POA: Diagnosis not present

## 2020-04-05 DIAGNOSIS — H40053 Ocular hypertension, bilateral: Secondary | ICD-10-CM | POA: Diagnosis not present

## 2020-04-05 DIAGNOSIS — H2513 Age-related nuclear cataract, bilateral: Secondary | ICD-10-CM | POA: Diagnosis not present

## 2020-04-05 DIAGNOSIS — D4981 Neoplasm of unspecified behavior of retina and choroid: Secondary | ICD-10-CM | POA: Diagnosis not present

## 2020-04-05 LAB — HM DIABETES EYE EXAM

## 2020-04-06 ENCOUNTER — Other Ambulatory Visit: Payer: Self-pay

## 2020-04-06 ENCOUNTER — Ambulatory Visit
Admission: RE | Admit: 2020-04-06 | Discharge: 2020-04-06 | Disposition: A | Discharge status: Home / Self Care | Payer: Medicare Other | Source: Ambulatory Visit | Attending: Radiation Oncology | Admitting: Radiation Oncology

## 2020-04-06 DIAGNOSIS — C50412 Malignant neoplasm of upper-outer quadrant of left female breast: Secondary | ICD-10-CM | POA: Diagnosis not present

## 2020-04-06 DIAGNOSIS — Z170 Estrogen receptor positive status [ER+]: Secondary | ICD-10-CM | POA: Diagnosis not present

## 2020-04-06 DIAGNOSIS — Z510 Encounter for antineoplastic radiation therapy: Secondary | ICD-10-CM | POA: Diagnosis not present

## 2020-04-07 ENCOUNTER — Ambulatory Visit
Admission: RE | Admit: 2020-04-07 | Discharge: 2020-04-07 | Disposition: A | Discharge status: Home / Self Care | Payer: Medicare Other | Source: Ambulatory Visit | Attending: Radiation Oncology | Admitting: Radiation Oncology

## 2020-04-07 DIAGNOSIS — Z170 Estrogen receptor positive status [ER+]: Secondary | ICD-10-CM | POA: Diagnosis not present

## 2020-04-07 DIAGNOSIS — Z510 Encounter for antineoplastic radiation therapy: Secondary | ICD-10-CM | POA: Diagnosis not present

## 2020-04-07 DIAGNOSIS — C50412 Malignant neoplasm of upper-outer quadrant of left female breast: Secondary | ICD-10-CM | POA: Diagnosis not present

## 2020-04-10 ENCOUNTER — Ambulatory Visit: Payer: Medicare Other | Attending: General Surgery

## 2020-04-10 ENCOUNTER — Other Ambulatory Visit: Payer: Self-pay

## 2020-04-10 ENCOUNTER — Ambulatory Visit
Admission: RE | Admit: 2020-04-10 | Discharge: 2020-04-10 | Disposition: A | Discharge status: Home / Self Care | Payer: Medicare Other | Source: Ambulatory Visit | Attending: Radiation Oncology | Admitting: Radiation Oncology

## 2020-04-10 DIAGNOSIS — Z510 Encounter for antineoplastic radiation therapy: Secondary | ICD-10-CM | POA: Diagnosis not present

## 2020-04-10 DIAGNOSIS — Z483 Aftercare following surgery for neoplasm: Secondary | ICD-10-CM

## 2020-04-10 DIAGNOSIS — C50412 Malignant neoplasm of upper-outer quadrant of left female breast: Secondary | ICD-10-CM | POA: Diagnosis not present

## 2020-04-10 DIAGNOSIS — Z170 Estrogen receptor positive status [ER+]: Secondary | ICD-10-CM | POA: Diagnosis not present

## 2020-04-10 NOTE — Therapy (Signed)
[place], [place], [address] Phone: [phone]   Fax:  [phone]  Physical Therapy Treatment  Patient Details  Name: Felicia [name] MRN: [id number] Date of Birth: 1950-03-21 Referring Provider (PT): Dr. Jana Hakim   Encounter Date: 04/10/2020   PT End of Session - 04/10/20 1540    Visit Number 3   # unchanged due to screen only   Number of Visits 11    Date for PT Re-Evaluation 03/17/20    PT Start Time 1532    PT Stop Time 1541    PT Time Calculation (min) 9 min    Activity Tolerance Patient tolerated treatment well    Behavior During Therapy [facility] for tasks assessed/performed           Past Medical History:  Diagnosis Date  . Anemia   . Anxiety   . Breast cancer ([place]) 09/2019   left breast IMC  . CAD (coronary artery disease)    a. 3/2007s/p DES to the LAD [facility]); b. 01/2017 MV: EF 80%, small, mild apical ant defect w/o ischemia (felt to be breast atten). Low risk.  . Depression   . DM (diabetes mellitus) ([place])   . Essential hypertension   . GERD (gastroesophageal reflux disease)   . Headache    secondary to neck surgery per patient  . High triglycerides   . Hyperlipidemia   . Hypothyroid   . OA (osteoarthritis)    right knee,hands  . Overflow incontinence   . PONV (postoperative nausea and vomiting)    s/p gallbladder   . RLS (restless legs syndrome)   . Urinary urgency   . Uterine cancer ([place]) dx'd 2014    Past Surgical History:  Procedure Laterality Date  . BREAST LUMPECTOMY WITH RADIOACTIVE SEED AND SENTINEL LYMPH NODE BIOPSY Left 10/07/2019   Procedure: LEFT BREAST LUMPECTOMY WITH RADIOACTIVE SEED AND SENTINEL LYMPH NODE BIOPSY;  Surgeon: Rolm Bookbinder, MD;  Location: [place];  Service: General;  Laterality: Left;  . CORONARY STENT PLACEMENT    . CORONARY/GRAFT ACUTE MI REVASCULARIZATION    . FINGER ARTHRODESIS Right 06/11/2019   Procedure:  ARTHRODESIS INDEX FINGER DISTAL PHALANGEAL JOINT;  Surgeon: [name], MD;  Location: [place];  Service: Orthopedics;  Laterality: Right;  . FOOT SURGERY Left   . LEFT HEART CATH AND CORONARY ANGIOGRAPHY N/A 02/16/2018   Procedure: LEFT HEART CATH AND CORONARY ANGIOGRAPHY;  Surgeon: Lorretta Harp, MD;  Location: [place] CV LAB;  Service: Cardiovascular;  Laterality: N/A;  . PORTACATH PLACEMENT Right 11/23/2019   Procedure: INSERTION PORT-A-CATH WITH ULTRASOUND GUIDANCE;  Surgeon: Rolm Bookbinder, MD;  Location: [place];  Service: General;  Laterality: Right;  . REPLACEMENT TOTAL KNEE Right   . SPINE SURGERY    . TOTAL ABDOMINAL HYSTERECTOMY     FOR UTERUS CANCER  . TOTAL HIP ARTHROPLASTY Left     There were no vitals filed for this visit.   Subjective Assessment - 04/10/20 1533    Subjective Pt returns for her 3 month L-Dex screen.    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 She underwent a left lumpectomy and 1 positive node removed on 10/07/2019. Ki67 is 20%. She has a history of uterine cancer in 2014 treated with hysterectomy and radiation, a left hip replacement, right knee replacement, and a C4-C7 fusion in 2014.  L-DEX FLOWSHEETS - 04/10/20 1500      L-DEX LYMPHEDEMA SCREENING   Measurement Type Unilateral    L-DEX MEASUREMENT EXTREMITY Upper Extremity    POSITION  Standing    DOMINANT SIDE Right    At Risk Side Left    BASELINE SCORE (UNILATERAL) 3.9    L-DEX SCORE (UNILATERAL) 4.6    VALUE CHANGE (UNILAT) 0.7                                  PT Long Term Goals - 01/21/20 1056      PT LONG TERM GOAL #1   Title Pt will report ability to get off the commode without pulling on the vanity    Time 8    Period Weeks    Status New      PT LONG TERM GOAL #2   Title Pt will improve BERG balance  score to 45 or better to decrease risk of falls    Time 8    Period Weeks    Status New      PT LONG TERM GOAL #3   Title Pt will report no LOB backwards with standing activities x 1 week    Time 8    Period Weeks      PT LONG TERM GOAL #4   Title Pt will be ind with HEP similar to OTAGO    Time 8    Period Weeks    Status New                 Plan - 04/10/20 1541    Clinical Impression Statement Pt returns for her 3 month L-Dex screen. Her change from baseline of 1.3 is WNLs so no further treatment is required at this time except to cont every 3 month L-Dex screens which pt is agreeable to.    PT Next Visit Plan Cont every 3 month L-Dex screens for up to 2 years from [facility].    Consulted and Agree with Plan of Care Patient           Patient will benefit from skilled therapeutic intervention in order to improve the following deficits and impairments:     Visit Diagnosis: Aftercare following surgery for neoplasm     Problem List Patient Active Problem List   Diagnosis Date Noted  . Port-A-Cath in place 11/24/2019  . Hepatic steatosis 09/28/2019  . Malignant neoplasm of upper-outer quadrant of left breast in female, estrogen receptor positive ([place]) 09/02/2019  . Endometrial cancer ([place]) 10/23/2018  . Cirrhosis of liver without ascites ([place]) 10/23/2018  . Diarrhea 10/23/2018  . Chest pain 02/15/2018  . Coronary artery disease involving native coronary artery of native heart without angina pectoris 11/18/2017  . Hyperlipidemia LDL goal <70 11/18/2017  . Type 2 diabetes mellitus without complication, without long-term current use of insulin ([place]) 11/18/2017  . Dizziness 11/18/2017    Otelia Limes, PTA 04/10/2020, 3:43 PM  [place], [place], [address] Phone: [phone]   Fax:  [phone]  Name: Pamelia Botto MRN: [id number] Date of Birth: 05/27/1950

## 2020-04-11 ENCOUNTER — Ambulatory Visit
Admission: RE | Admit: 2020-04-11 | Discharge: 2020-04-11 | Disposition: A | Discharge status: Home / Self Care | Payer: Medicare Other | Source: Ambulatory Visit | Attending: Radiation Oncology | Admitting: Radiation Oncology

## 2020-04-11 ENCOUNTER — Ambulatory Visit (HOSPITAL_COMMUNITY)
Admission: RE | Admit: 2020-04-11 | Discharge: 2020-04-11 | Disposition: A | Discharge status: Home / Self Care | Payer: Medicare Other | Source: Ambulatory Visit | Attending: Oncology | Admitting: Oncology

## 2020-04-11 DIAGNOSIS — I6782 Cerebral ischemia: Secondary | ICD-10-CM | POA: Diagnosis not present

## 2020-04-11 DIAGNOSIS — C50919 Malignant neoplasm of unspecified site of unspecified female breast: Secondary | ICD-10-CM | POA: Diagnosis not present

## 2020-04-11 DIAGNOSIS — Z170 Estrogen receptor positive status [ER+]: Secondary | ICD-10-CM | POA: Diagnosis not present

## 2020-04-11 DIAGNOSIS — C541 Malignant neoplasm of endometrium: Secondary | ICD-10-CM | POA: Diagnosis present

## 2020-04-11 DIAGNOSIS — C50412 Malignant neoplasm of upper-outer quadrant of left female breast: Secondary | ICD-10-CM | POA: Diagnosis not present

## 2020-04-11 DIAGNOSIS — Z510 Encounter for antineoplastic radiation therapy: Secondary | ICD-10-CM | POA: Diagnosis not present

## 2020-04-11 MED ORDER — GADOBUTROL 1 MMOL/ML IV SOLN
7.0000 mL | Freq: Once | INTRAVENOUS | Status: AC | PRN
  Administered 2020-04-11: 7 mL via INTRAVENOUS

## 2020-04-12 ENCOUNTER — Telehealth: Payer: Self-pay

## 2020-04-12 ENCOUNTER — Other Ambulatory Visit: Payer: Self-pay

## 2020-04-12 ENCOUNTER — Ambulatory Visit
Admission: RE | Admit: 2020-04-12 | Discharge: 2020-04-12 | Disposition: A | Discharge status: Home / Self Care | Payer: Medicare Other | Source: Ambulatory Visit | Attending: Radiation Oncology | Admitting: Radiation Oncology

## 2020-04-12 DIAGNOSIS — C50412 Malignant neoplasm of upper-outer quadrant of left female breast: Secondary | ICD-10-CM | POA: Diagnosis not present

## 2020-04-12 DIAGNOSIS — Z510 Encounter for antineoplastic radiation therapy: Secondary | ICD-10-CM | POA: Diagnosis not present

## 2020-04-12 DIAGNOSIS — Z170 Estrogen receptor positive status [ER+]: Secondary | ICD-10-CM | POA: Diagnosis not present

## 2020-04-12 NOTE — Telephone Encounter (Signed)
-----   Message from Gardenia Phlegm, NP sent at 04/11/2020  4:29 PM EDT ----- No cancer noted on brain MRI.  Please notify patient of good results. ----- Message ----- From: Buel Ream, Rad Results In Sent: 04/11/2020  12:39 PM EDT To: Chauncey Cruel, MD

## 2020-04-12 NOTE — Telephone Encounter (Signed)
Called and left a message that MRI looks good. Ask her to call the office back for questions.

## 2020-04-13 ENCOUNTER — Ambulatory Visit
Admission: RE | Admit: 2020-04-13 | Discharge: 2020-04-13 | Disposition: A | Discharge status: Home / Self Care | Payer: Medicare Other | Source: Ambulatory Visit | Attending: Radiation Oncology | Admitting: Radiation Oncology

## 2020-04-13 DIAGNOSIS — C50412 Malignant neoplasm of upper-outer quadrant of left female breast: Secondary | ICD-10-CM | POA: Diagnosis not present

## 2020-04-13 DIAGNOSIS — Z170 Estrogen receptor positive status [ER+]: Secondary | ICD-10-CM | POA: Diagnosis not present

## 2020-04-13 DIAGNOSIS — Z510 Encounter for antineoplastic radiation therapy: Secondary | ICD-10-CM | POA: Diagnosis not present

## 2020-04-13 NOTE — Progress Notes (Signed)
[place] Patient and [facility] Counseling Note  The patient and counselor first discussed the patient quitting her job and what emotions had contributed to that decision. 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 The patient did report that being without a job is stressful, but she is still doing better than a few months ago because she is not experiencing the "aggravation" of her job. The counselor and patient discussed coping skills and what the patient can engage in to be happier. The counselor provided a safe space to process, reflections, and active listening. The patient was oriented times three. She was in a mostly frustrated mood with a congruent affect. There were no signs of SI/HI/NSSI. The patient laughed out loud multiple times during session after sharing about struggles.The patient is adjusting to her new treatment, which has resulted in a mood boost. Thinking about bills (I.e "Can I pay all my bills/stay afloat?" has resulted in distress and worry for the patient. Combined with the stress and fatigue of chemo, patient had retreated from the world and was depressed. As treatment improves and patient learns skills, she will continue to feel better. Patient reported still feeling better, despite additional stress from being unemployed. Next session will discuss goals, lists, and how to know if she needs to seek help.     Gaylyn Rong Counseling Intern

## 2020-04-14 ENCOUNTER — Ambulatory Visit
Admission: RE | Admit: 2020-04-14 | Discharge: 2020-04-14 | Disposition: A | Discharge status: Home / Self Care | Payer: Medicare Other | Source: Ambulatory Visit | Attending: Radiation Oncology | Admitting: Radiation Oncology

## 2020-04-14 ENCOUNTER — Other Ambulatory Visit: Payer: Self-pay

## 2020-04-14 DIAGNOSIS — C50412 Malignant neoplasm of upper-outer quadrant of left female breast: Secondary | ICD-10-CM | POA: Diagnosis not present

## 2020-04-14 DIAGNOSIS — Z170 Estrogen receptor positive status [ER+]: Secondary | ICD-10-CM | POA: Insufficient documentation

## 2020-04-17 ENCOUNTER — Other Ambulatory Visit: Payer: Self-pay

## 2020-04-17 ENCOUNTER — Ambulatory Visit
Admission: RE | Admit: 2020-04-17 | Discharge: 2020-04-17 | Disposition: A | Discharge status: Home / Self Care | Payer: Medicare Other | Source: Ambulatory Visit | Attending: Radiation Oncology | Admitting: Radiation Oncology

## 2020-04-17 ENCOUNTER — Telehealth: Payer: Self-pay | Admitting: *Deleted

## 2020-04-17 DIAGNOSIS — M542 Cervicalgia: Secondary | ICD-10-CM

## 2020-04-17 DIAGNOSIS — Z170 Estrogen receptor positive status [ER+]: Secondary | ICD-10-CM | POA: Diagnosis not present

## 2020-04-17 DIAGNOSIS — C50412 Malignant neoplasm of upper-outer quadrant of left female breast: Secondary | ICD-10-CM | POA: Diagnosis not present

## 2020-04-17 NOTE — Telephone Encounter (Signed)
CALLED PATIENT TO INFORM OF APPT. WITH DR. Allena Katz MCKINLEY ON 04-19-20 - ARRIVAL TIME- 2 PM, LVM FOR A RETURN CALL

## 2020-04-18 ENCOUNTER — Ambulatory Visit
Admission: RE | Admit: 2020-04-18 | Discharge: 2020-04-18 | Disposition: A | Discharge status: Home / Self Care | Payer: Medicare Other | Source: Ambulatory Visit | Attending: Radiation Oncology | Admitting: Radiation Oncology

## 2020-04-18 ENCOUNTER — Other Ambulatory Visit: Payer: Self-pay

## 2020-04-18 DIAGNOSIS — C50412 Malignant neoplasm of upper-outer quadrant of left female breast: Secondary | ICD-10-CM

## 2020-04-18 DIAGNOSIS — Z170 Estrogen receptor positive status [ER+]: Secondary | ICD-10-CM

## 2020-04-19 ENCOUNTER — Other Ambulatory Visit: Payer: Self-pay

## 2020-04-19 ENCOUNTER — Ambulatory Visit
Admission: RE | Admit: 2020-04-19 | Discharge: 2020-04-19 | Disposition: A | Discharge status: Home / Self Care | Payer: Medicare Other | Source: Ambulatory Visit | Attending: Radiation Oncology | Admitting: Radiation Oncology

## 2020-04-19 DIAGNOSIS — C50412 Malignant neoplasm of upper-outer quadrant of left female breast: Secondary | ICD-10-CM | POA: Diagnosis not present

## 2020-04-19 DIAGNOSIS — Z170 Estrogen receptor positive status [ER+]: Secondary | ICD-10-CM | POA: Diagnosis not present

## 2020-04-19 DIAGNOSIS — M24811 Other specific joint derangements of right shoulder, not elsewhere classified: Secondary | ICD-10-CM | POA: Diagnosis not present

## 2020-04-19 NOTE — Progress Notes (Incomplete)
[place]  [phone] [phone]     ID: Felicia [name] DOB: Sep 29, 1950  MR#: [id number]  YWV#:[id number]  Patient Care Team: Felicia Lass, MD as PCP - General (Family Medicine) Nahser, Felicia Cheng, MD as PCP - Cardiology (Cardiology) Felicia Kaufmann, RN as Oncology Nurse Navigator Felicia Germany, RN as Oncology Nurse Navigator Felicia Bookbinder, MD as Consulting Physician (General Surgery) [name], Felicia Dad, MD as Consulting Physician (Oncology) Felicia Gibson, MD as Attending Physician (Radiation Oncology) Felicia Pound, MD as Consulting Physician (Rheumatology) Felicia Cover, MD as Consulting Physician (Orthopedic Surgery) Felicia Pupa, DO as Consulting Physician (Obstetrics and Gynecology) Felicia Dresser, MD as Consulting Physician (Cardiology) Felicia Rear, MD as Consulting Physician (Endocrinology) Felicia [name] OTHER MD:  CHIEF COMPLAINT: Triple positive invasive breast cancer  CURRENT TREATMENT: T-DM1; adjuvant radiation   INTERVAL HISTORY: Felicia [name] returns today for follow up and treatment of her triple positive invasive breast cancer.  She is accompanied by her daughter Felicia [name] who will be driving her home.  Since her last visit, she underwent brain MRI on 04/11/2020 showing no evidence of intracranial metastatic disease.  She was switched to T-DM1 at her last visit on 02/16/2020.  She is tolerating this much better.  She is receiving lorazepam with her treatment and does not have the restless legs problems she was having before.  Her most recent echocardiogram from 01/17/2020 showed a normal EF of 60-65%.  She continues on adjuvant radiation and is scheduled to finish on 05/10/2020.   REVIEW OF SYSTEMS: Felicia [name]    COVID 19 VACCINATION STATUS: Refuses vaccination   HISTORY OF CURRENT ILLNESS: From the original intake note:  Felicia [name] had routine screening mammography on 08/03/2019 showing a possible  abnormality in the left breast. She underwent left diagnostic mammography with tomography and left breast ultrasonography at [facility] on 08/17/2019 showing: breast density category B; 1.6 cm upper-outer left breast mass; no significant left axillary abnormalities.  Accordingly on 08/30/2019 she proceeded to biopsy of the left breast area in question. The pathology from this procedure ([id number]) showed: invasive mammary carcinoma, e-cadherin positive, grade 2. Prognostic indicators significant for: estrogen receptor, 100% positive and progesterone receptor, 40% positive, both with strong staining intensity. Proliferation marker Ki67 at 20%. HER2 equivocal by immunohistochemistry (2+), but negative by fluorescent in situ hybridization with a signals ratio   and number per cell  .  Of note, she also has a history of FIGO grade 3, stage IA endometrial carcinoma, diagnosed in 2014 in [place]. She underwent robotic assisted total hysterectomy with BSO and pelvic and perirectal aortic lymph node dissection as well as omental biopsy.  Tumor was found only in the endometrium arising in a polyp measuring 1.7 cm.  There was no myometrial invasion no lymphovascular space invasion, and no lower uterine segment involvement.  The cervix and adnexa, lymph nodes and omentum were all not involved.  She then received vaginal brachytherapy, no chemotherapy, completing treatment in the springtime of 2015. [Per patient's report, chemotherapy was suggested, but she opted against this.]  The patient's subsequent history is as detailed below.   PAST MEDICAL HISTORY: Past Medical History:  Diagnosis Date  . Anemia   . Anxiety   . Breast cancer ([place]) 09/2019   left breast IMC  . CAD (coronary artery disease)    a. 3/2007s/p DES to the [facility]); b. 01/2017 MV: EF 80%, small, mild apical ant defect w/o ischemia (felt to be breast atten). Low risk.  Marland Kitchen  Depression   . DM (diabetes mellitus) ([place])   . Essential  hypertension   . GERD (gastroesophageal reflux disease)   . Headache    secondary to neck surgery per patient  . High triglycerides   . Hyperlipidemia   . Hypothyroid   . OA (osteoarthritis)    right knee,hands  . Overflow incontinence   . PONV (postoperative nausea and vomiting)    s/p gallbladder   . RLS (restless legs syndrome)   . Urinary urgency   . Uterine cancer ([place]) dx'd 2014    PAST SURGICAL HISTORY: Past Surgical History:  Procedure Laterality Date  . BREAST LUMPECTOMY WITH RADIOACTIVE SEED AND SENTINEL LYMPH NODE BIOPSY Left 10/07/2019   Procedure: LEFT BREAST LUMPECTOMY WITH RADIOACTIVE SEED AND SENTINEL LYMPH NODE BIOPSY;  Surgeon: Felicia Bookbinder, MD;  Location: [place];  Service: General;  Laterality: Left;  . CORONARY STENT PLACEMENT    . CORONARY/GRAFT ACUTE MI REVASCULARIZATION    . FINGER ARTHRODESIS Right 06/11/2019   Procedure: ARTHRODESIS INDEX FINGER DISTAL PHALANGEAL JOINT;  Surgeon: Felicia Cover, MD;  Location: [place];  Service: Orthopedics;  Laterality: Right;  . FOOT SURGERY Left   . LEFT HEART CATH AND CORONARY ANGIOGRAPHY N/A 02/16/2018   Procedure: LEFT HEART CATH AND CORONARY ANGIOGRAPHY;  Surgeon: Lorretta Harp, MD;  Location: [place] [facility];  Service: Cardiovascular;  Laterality: N/A;  . PORTACATH PLACEMENT Right 11/23/2019   Procedure: INSERTION PORT-A-CATH WITH ULTRASOUND GUIDANCE;  Surgeon: Felicia Bookbinder, MD;  Location: [place];  Service: General;  Laterality: Right;  . REPLACEMENT TOTAL KNEE Right   . SPINE SURGERY    . TOTAL ABDOMINAL HYSTERECTOMY     FOR UTERUS CANCER  . TOTAL HIP ARTHROPLASTY Left     FAMILY HISTORY: Family History  Problem Relation Age of Onset  . AAA (abdominal aortic aneurysm) Mother   . Diabetes Father   . Alzheimer's disease Father   . Throat cancer Sister   . Breast cancer Cousin    Her father died at age 71 from Alzheimer's. Her mother died  at age 7 from a ruptured abdominal aneurysm. Felicia [name] has 2 brothers and 3 sisters. She reports breast cancer in a paternal cousin. She also notes throat cancer in her sister.   GYNECOLOGIC HISTORY:  No LMP recorded. Patient has had a hysterectomy. Menarche: 70 years old Age at first live birth: 70 years old [place] P 2 LMP 1996 Contraceptive: prior use without issue HRT never used  Hysterectomy? Yes, 2014 BSO? 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  She unpacks 40 pounds boxes of chicken, has to cook them in 13 [name] cooking pans, and is generally on her feet at work but does not otherwise exercise.  She is divorced. She lives at home by herself, with no pets. Daughter Pershing Proud, age 1, is a radiology tech in [place]. Daughter Martyn Malay, age 39, is an Optometrist in [place], [place]. Jazzlynn has one grandchild. She is not a Ambulance person.    ADVANCED DIRECTIVES: Not in place; intends to name daughter Felicia [name] as her HCPOA.   HEALTH MAINTENANCE: Social History   Tobacco Use  . Smoking status: Never Smoker  . Smokeless tobacco: Never Used  Vaping Use  . Vaping Use: Never used  Substance Use Topics  . Alcohol use: Yes    Comment: OCCASIONALLY  . Drug use: No     Colonoscopy: <10  years ago, in [place]  PAP: 2014?  Bone density: date unsure, also in [place]   Allergies  Allergen Reactions  . Chloraprep One Step [Chlorhexidine Gluconate] Rash  . Estrogens Other (See Comments)    PATIENT HAS A HISTORY OF CANCER AND HAS BEEN TOLD TO NEVER TAKE ANYTHING CONTAINING ESTROGEN, AS IT MIGHT CAUSE A RECURRENCE  . Shrimp [Shellfish Allergy] Nausea And Vomiting    Current Outpatient Medications  Medication Sig Dispense Refill  . ACCU-CHEK GUIDE test strip 1 each by Other route as needed.    . Ascorbic Acid (VITAMIN C PO) Take 1 tablet by mouth daily.    Marland Kitchen aspirin EC 81 MG tablet Take 81 mg by mouth daily.    Marland Kitchen atorvastatin  (LIPITOR) 40 MG tablet TAKE 1 TABLET BY MOUTH EVERY DAY 90 tablet 1  . bisoprolol (ZEBETA) 5 MG tablet TAKE 1 TABLET BY MOUTH EVERY DAY 90 tablet 2  . butalbital-aspirin-caffeine (FIORINAL) 50-325-40 MG capsule Take 1 capsule by mouth 4 (four) times daily as needed.    . Calcium Citrate-Vitamin D (CALCIUM + D PO) Take 1 tablet by mouth daily with supper.    Marland Kitchen CINNAMON PO Take 1 capsule by mouth daily with supper.    . diclofenac sodium (VOLTAREN) 1 % GEL Apply 2-4 g topically 4 (four) times daily as needed (as directed for pain).    Marland Kitchen diclofenac Sodium (VOLTAREN) 1 % GEL Apply 1 application topically 3 (three) times daily as needed.    . Dulaglutide (TRULICITY) 1.5 VO/3.5KK SOPN Inject 1.5 mg into the skin once a week.    . escitalopram (LEXAPRO) 10 MG tablet Take 10 mg by mouth daily.    . Ferrous Sulfate (IRON PO) Take 1 tablet by mouth daily. alternates days:1 tablet one day and 2 tablets the next day    . ferrous sulfate 325 (65 FE) MG EC tablet Take 325 mg by mouth 3 (three) times daily with meals.    . gabapentin (NEURONTIN) 300 MG capsule Take 1 capsule (300 mg total) by mouth at bedtime. 90 capsule 4  . GLIPIZIDE XL 10 MG 24 hr tablet Take 20 mg by mouth daily.   3  . JARDIANCE 25 MG TABS tablet Take 25 mg by mouth every morning.   3  . levothyroxine (SYNTHROID, LEVOTHROID) 125 MCG tablet Take 125 mcg by mouth daily before breakfast.    . metFORMIN (GLUCOPHAGE) 500 MG tablet Take 500 mg by mouth 2 (two) times daily with a meal.    . metroNIDAZOLE (METROGEL) 1 % gel Apply 1 application topically as needed (as directed to affected area).     . Multiple Vitamins-Calcium (ONE-A-DAY WOMENS PO) Take 1 tablet by mouth daily.    . nitroGLYCERIN (NITROSTAT) 0.4 MG SL tablet Place 1 tablet (0.4 mg total) under the tongue every 5 (five) minutes as needed for chest pain. 25 tablet 6  . omeprazole (PRILOSEC) 40 MG capsule Take 40 mg by mouth daily with supper.     . prochlorperazine (COMPAZINE) 10 MG  tablet Take 1 tablet by mouth before meals and at bedtime on days 2 and 3 after chemotherapy (counting chemotherapy day as day 1); after that may take as needed 30 tablet 1  . rOPINIRole (REQUIP) 0.5 MG tablet Take 0.5 mg by mouth at bedtime. 1-3 hours prior to bedtime    . tiZANidine (ZANAFLEX) 4 MG tablet Take 4 mg by mouth 3 (three) times daily. As needed    . TURMERIC PO Take  1 capsule by mouth daily with supper.     No current facility-administered medications for this visit.    OBJECTIVE: White woman who appears stated age  There were no vitals filed for this visit.   There is no height or weight on file to calculate BMI.   Wt Readings from Last 3 Encounters:  03/29/20 168 lb 9.6 oz (76.5 kg)  03/21/20 171 lb 3.2 oz (77.7 kg)  03/08/20 171 lb 11.2 oz (77.9 kg)      ECOG FS:1 - Symptomatic but completely ambulatory  Sclerae unicteric, EOMs intact Wearing a mask No cervical or supraclavicular adenopathy Lungs no rales or rhonchi Heart regular rate and rhythm Abd soft, nontender, positive bowel sounds MSK no focal spinal tenderness, no upper extremity lymphedema Neuro: nonfocal, well oriented, appropriate affect Breasts:    {Sclerae unicteric, EOMs intact Wearing a mask No cervical or supraclavicular adenopathy Lungs no rales or rhonchi Heart regular rate and rhythm Abd soft, nontender, positive bowel sounds MSK no focal spinal tenderness, no upper extremity lymphedema; she is using a cane today Neuro: nonfocal, well oriented, appropriate affect Breasts: Deferred}   LAB RESULTS:  CMP     Component Value Date/Time   NA 139 03/29/2020 1222   NA 139 11/25/2018 1053   K 3.9 03/29/2020 1222   CL 102 03/29/2020 1222   CO2 25 03/29/2020 1222   GLUCOSE 338 (H) 03/29/2020 1222   BUN 16 03/29/2020 1222   BUN 18 11/25/2018 1053   CREATININE 0.81 03/29/2020 1222   CALCIUM 9.8 03/29/2020 1222   PROT 7.4 03/29/2020 1222   PROT 7.1 11/25/2018 1053   ALBUMIN 3.9  03/29/2020 1222   ALBUMIN 4.2 11/25/2018 1053   AST 27 03/29/2020 1222   ALT 37 03/29/2020 1222   ALKPHOS 77 03/29/2020 1222   BILITOT 0.4 03/29/2020 1222   GFRNONAA >60 03/29/2020 1222   GFRAA >60 10/04/2019 1330   GFRAA >60 09/08/2019 0811    No results found for: TOTALPROTELP, ALBUMINELP, A1GS, A2GS, BETS, BETA2SER, GAMS, MSPIKE, SPEI  Lab Results  Component Value Date   WBC 11.1 (H) 03/29/2020   NEUTROABS 3.6 03/29/2020   HGB 13.9 03/29/2020   HCT 43.3 03/29/2020   MCV 103.3 (H) 03/29/2020   PLT 227 03/29/2020    No results found for: LABCA2  No components found for: [id number]  No results for input(s): INR in the last 168 hours.  No results found for: LABCA2  No results found for: HGD924  No results found for: QAS341  No results found for: DQQ229  No results found for: CA2729  No components found for: HGQUANT  No results found for: CEA1 / No results found for: CEA1   No results found for: AFPTUMOR  No results found for: CHROMOGRNA  No results found for: KPAFRELGTCHN, LAMBDASER, KAPLAMBRATIO (kappa/lambda light chains)  No results found for: HGBA, HGBA2QUANT, HGBFQUANT, HGBSQUAN (Hemoglobinopathy evaluation)   No results found for: LDH  No results found for: IRON, TIBC, IRONPCTSAT (Iron and TIBC)  No results found for: FERRITIN  Urinalysis No results found for: COLORURINE, APPEARANCEUR, LABSPEC, PHURINE, GLUCOSEU, HGBUR, BILIRUBINUR, KETONESUR, PROTEINUR, UROBILINOGEN, NITRITE, LEUKOCYTESUR   STUDIES: MR Brain W Wo Contrast  Result Date: 04/11/2020 CLINICAL DATA:  Breast cancer, staging.  Endometrial cancer. EXAM: MRI HEAD WITHOUT AND WITH CONTRAST TECHNIQUE: Multiplanar, multiecho pulse sequences of the brain and surrounding structures were obtained without and with intravenous contrast. CONTRAST:  55m GADAVIST GADOBUTROL 1 MMOL/ML IV SOLN COMPARISON:  None. FINDINGS: Brain: No  acute infarction, hemorrhage, hydrocephalus, extra-axial collection  or mass lesion. Scattered foci of T2 hyperintensity are seen within the white matter of the cerebral hemispheres, nonspecific, most likely related to chronic small vessel ischemia. No focus of abnormal contrast enhancement. Vascular: Normal flow voids. Skull and upper cervical spine: Normal marrow signal. Sinuses/Orbits: Negative. IMPRESSION: 1. No evidence of intracranial metastatic disease. 2. Mild chronic small vessel ischemia. Electronically Signed   By: Pedro Earls M.D.   On: 04/11/2020 12:36     ELIGIBLE FOR AVAILABLE RESEARCH PROTOCOL: AET  ASSESSMENT: 70 y.o. [place] woman status post left breast upper outer quadrant biopsy 08/30/2019 for a clinical T1c N0, stage IA invasive ductal carcinoma, grade 2, estrogen and progesterone receptor positive, HER-2 amplified, with an MIB-1 of 20%.  (0) status post TAH/BSO 2014 for a FIGO grade 3, stage IA endometrial carcinoma, status post vaginal brachytherapy, no adjuvant chemotherapy  (1) status post left lumpectomy and axillary sentinel node sampling 10/07/2019 for a pT1c pN1, stage IB invasive ductal carcinoma, grade 2, with negative margins.  (a) a total of 1 sentinel lymph node was removed.  (2) adjuvant chemotherapy consisting of carboplatin, gemcitabine, trastuzumab given every 3 weeks x 6 started 11/24/2019, last dose 01/26/2020  (a) baseline echo 09/15/2019 shows an ejection fraction in the 60-65% range.  (b) will not use docetaxel secondary to severe peripheral neuropathy present at baseline  (c) echo on 01/17/2020 shows EF of 60-65%  (d) chemotherapy discontinued after 4 cycles with poor tolerance.  (3) T-DM1 started 02/16/2020, to be continued through November 2022  (a) echo  (4) adjuvant radiation pending  (5) antiestrogens to start at the completion of local treatment   PLAN: Tiffani is tolerating T-DM1 well and the plan is to continue that for a total of 14 doses.  She will need a repeat echocardiogram  early April.  She is starting radiation tomorrow.  I am going to see her in the treatment area 3 weeks from now, with her next Kadcyla dose, just to make sure there are no complications.  I am concerned about her persistent headaches.  I have put her in for a brain MRI to be done in the next week.  Otherwise she knows to call for any other issue that may develop before the next visit  Total encounter time 25 minutes.Sarajane Jews C. Magrinat, MD 04/19/20 8:33 PM Medical Oncology and Hematology [facility] [place], [place] [address] Tel. [phone]    Fax. [phone]   I, Wilburn Mylar, am acting as scribe for Dr. Virgie Dad. [name].  I, Lurline Del MD, have reviewed the above documentation for accuracy and completeness, and I agree with the above.   *Total Encounter Time as defined by the Centers for Medicare and Medicaid Services includes, in addition to the face-to-face time of a patient visit (documented in the note above) non-face-to-face time: obtaining and reviewing outside history, ordering and reviewing medications, tests or procedures, care coordination (communications with other health care professionals or caregivers) and documentation in the medical record.

## 2020-04-20 ENCOUNTER — Inpatient Hospital Stay: Payer: Medicare Other | Admitting: Oncology

## 2020-04-20 ENCOUNTER — Ambulatory Visit
Admission: RE | Admit: 2020-04-20 | Discharge: 2020-04-20 | Disposition: A | Discharge status: Home / Self Care | Payer: Medicare Other | Source: Ambulatory Visit | Attending: Radiation Oncology | Admitting: Radiation Oncology

## 2020-04-20 ENCOUNTER — Inpatient Hospital Stay: Payer: Medicare Other

## 2020-04-20 ENCOUNTER — Inpatient Hospital Stay: Payer: Medicare Other | Attending: Oncology

## 2020-04-20 ENCOUNTER — Encounter: Payer: Self-pay | Admitting: Adult Health

## 2020-04-20 ENCOUNTER — Inpatient Hospital Stay: Payer: Medicare Other | Admitting: Adult Health

## 2020-04-20 VITALS — BP 117/80 | HR 85 | Temp 97.7°F | Resp 18 | Ht 64.0 in | Wt 170.6 lb

## 2020-04-20 DIAGNOSIS — Z170 Estrogen receptor positive status [ER+]: Secondary | ICD-10-CM

## 2020-04-20 DIAGNOSIS — M1711 Unilateral primary osteoarthritis, right knee: Secondary | ICD-10-CM | POA: Diagnosis not present

## 2020-04-20 DIAGNOSIS — Z5112 Encounter for antineoplastic immunotherapy: Secondary | ICD-10-CM | POA: Insufficient documentation

## 2020-04-20 DIAGNOSIS — G620 Drug-induced polyneuropathy: Secondary | ICD-10-CM | POA: Diagnosis not present

## 2020-04-20 DIAGNOSIS — T451X5A Adverse effect of antineoplastic and immunosuppressive drugs, initial encounter: Secondary | ICD-10-CM | POA: Insufficient documentation

## 2020-04-20 DIAGNOSIS — C50412 Malignant neoplasm of upper-outer quadrant of left female breast: Secondary | ICD-10-CM

## 2020-04-20 DIAGNOSIS — I6782 Cerebral ischemia: Secondary | ICD-10-CM | POA: Diagnosis not present

## 2020-04-20 DIAGNOSIS — E109 Type 1 diabetes mellitus without complications: Secondary | ICD-10-CM | POA: Insufficient documentation

## 2020-04-20 DIAGNOSIS — Z833 Family history of diabetes mellitus: Secondary | ICD-10-CM | POA: Insufficient documentation

## 2020-04-20 DIAGNOSIS — Z90722 Acquired absence of ovaries, bilateral: Secondary | ICD-10-CM | POA: Insufficient documentation

## 2020-04-20 DIAGNOSIS — Z803 Family history of malignant neoplasm of breast: Secondary | ICD-10-CM | POA: Insufficient documentation

## 2020-04-20 DIAGNOSIS — Z818 Family history of other mental and behavioral disorders: Secondary | ICD-10-CM | POA: Insufficient documentation

## 2020-04-20 DIAGNOSIS — Z808 Family history of malignant neoplasm of other organs or systems: Secondary | ICD-10-CM | POA: Insufficient documentation

## 2020-04-20 DIAGNOSIS — Z8542 Personal history of malignant neoplasm of other parts of uterus: Secondary | ICD-10-CM | POA: Diagnosis not present

## 2020-04-20 DIAGNOSIS — Z79899 Other long term (current) drug therapy: Secondary | ICD-10-CM | POA: Insufficient documentation

## 2020-04-20 DIAGNOSIS — Z8249 Family history of ischemic heart disease and other diseases of the circulatory system: Secondary | ICD-10-CM | POA: Diagnosis not present

## 2020-04-20 DIAGNOSIS — G2581 Restless legs syndrome: Secondary | ICD-10-CM | POA: Diagnosis not present

## 2020-04-20 LAB — CMP (CANCER CENTER ONLY)
ALT: 32 U/L (ref 0–44)
AST: 31 U/L (ref 15–41)
Albumin: 3.9 g/dL (ref 3.5–5.0)
Alkaline Phosphatase: 71 U/L (ref 38–126)
Anion gap: 16 — ABNORMAL HIGH (ref 5–15)
BUN: 14 mg/dL (ref 8–23)
CO2: 22 mmol/L (ref 22–32)
Calcium: 9.7 mg/dL (ref 8.9–10.3)
Chloride: 101 mmol/L (ref 98–111)
Creatinine: 0.79 mg/dL (ref 0.44–1.00)
GFR, Estimated: >60 mL/min (ref >60)
Glucose, Bld: 305 mg/dL — ABNORMAL HIGH (ref 70–99)
Potassium: 4.2 mmol/L (ref 3.5–5.1)
Sodium: 139 mmol/L (ref 135–145)
Total Bilirubin: 0.4 mg/dL (ref 0.3–1.2)
Total Protein: 7.5 g/dL (ref 6.5–8.1)

## 2020-04-20 LAB — CBC WITH DIFFERENTIAL (CANCER CENTER ONLY)
Abs Immature Granulocytes: 0.03 10*3/uL (ref 0.00–0.07)
Basophils Absolute: 0 10*3/uL (ref 0.0–0.1)
Basophils Relative: 0 %
Eosinophils Absolute: 0.1 10*3/uL (ref 0.0–0.5)
Eosinophils Relative: 1 %
HCT: 42.2 % (ref 36.0–46.0)
Hemoglobin: 13.9 g/dL (ref 12.0–15.0)
Immature Granulocytes: 0 %
Lymphocytes Relative: 32 %
Lymphs Abs: 2.5 10*3/uL (ref 0.7–4.0)
MCH: 32.9 pg (ref 26.0–34.0)
MCHC: 32.9 g/dL (ref 30.0–36.0)
MCV: 99.8 fL (ref 80.0–100.0)
Monocytes Absolute: 0.7 10*3/uL (ref 0.1–1.0)
Monocytes Relative: 9 %
Neutro Abs: 4.4 10*3/uL (ref 1.7–7.7)
Neutrophils Relative %: 58 %
Platelet Count: 183 10*3/uL (ref 150–400)
RBC: 4.23 MIL/uL (ref 3.87–5.11)
RDW: 14.3 % (ref 11.5–15.5)
WBC Count: 7.7 10*3/uL (ref 4.0–10.5)
nRBC: 0 % (ref 0.0–0.2)

## 2020-04-20 MED ORDER — LORAZEPAM 2 MG/ML IJ SOLN
0.5000 mg | Freq: Once | INTRAMUSCULAR | Status: AC
  Administered 2020-04-20: 0.5 mg via INTRAVENOUS

## 2020-04-20 MED ORDER — METRONIDAZOLE 1 % EX GEL: 1.0000 "application " | CUTANEOUS | 2 refills | Status: DC | PRN

## 2020-04-20 MED ORDER — LORAZEPAM 2 MG/ML IJ SOLN
INTRAMUSCULAR | Status: AC
  Filled 2020-04-20: qty 1

## 2020-04-20 MED ORDER — DIPHENHYDRAMINE HCL 25 MG PO CAPS
25.0000 mg | ORAL_CAPSULE | Freq: Once | ORAL | Status: AC
  Administered 2020-04-20: 25 mg via ORAL

## 2020-04-20 MED ORDER — ACETAMINOPHEN 325 MG PO TABS
ORAL_TABLET | ORAL | Status: AC
  Filled 2020-04-20: qty 2

## 2020-04-20 MED ORDER — SODIUM CHLORIDE 0.9% FLUSH
10.0000 mL | INTRAVENOUS | Status: DC | PRN
  Administered 2020-04-20: 10 mL
  Filled 2020-04-20: qty 10

## 2020-04-20 MED ORDER — SODIUM CHLORIDE 0.9 % IV SOLN
3.6000 mg/kg | Freq: Once | INTRAVENOUS | Status: AC
  Administered 2020-04-20: 280 mg via INTRAVENOUS
  Filled 2020-04-20: qty 14

## 2020-04-20 MED ORDER — DIPHENHYDRAMINE HCL 25 MG PO CAPS
ORAL_CAPSULE | ORAL | Status: AC
  Filled 2020-04-20: qty 1

## 2020-04-20 MED ORDER — HEPARIN SOD (PORK) LOCK FLUSH 100 UNIT/ML IV SOLN
500.0000 [IU] | Freq: Once | INTRAVENOUS | Status: AC | PRN
  Administered 2020-04-20: 500 [IU]
  Filled 2020-04-20: qty 5

## 2020-04-20 MED ORDER — ACETAMINOPHEN 325 MG PO TABS
650.0000 mg | ORAL_TABLET | Freq: Once | ORAL | Status: AC
  Administered 2020-04-20: 650 mg via ORAL

## 2020-04-20 MED ORDER — SODIUM CHLORIDE 0.9 % IV SOLN
Freq: Once | INTRAVENOUS | Status: AC
Start: 2020-04-20 — End: 2020-04-20
  Filled 2020-04-20: qty 250

## 2020-04-20 NOTE — Patient Instructions (Signed)
[facility] Discharge Instructions for Patients Receiving Chemotherapy  Today you received the following chemotherapy agents Kadcyla  To help prevent nausea and vomiting after your treatment, we encourage you to take your nausea medication as directed   If you develop nausea and vomiting that is not controlled by your nausea medication, call the clinic.   BELOW ARE SYMPTOMS THAT SHOULD BE REPORTED IMMEDIATELY:  *FEVER GREATER THAN 100.5 F  *CHILLS WITH OR WITHOUT FEVER  NAUSEA AND VOMITING THAT IS NOT CONTROLLED WITH YOUR NAUSEA MEDICATION  *UNUSUAL SHORTNESS OF BREATH  *UNUSUAL BRUISING OR BLEEDING  TENDERNESS IN MOUTH AND THROAT WITH OR WITHOUT PRESENCE OF ULCERS  *URINARY PROBLEMS  *BOWEL PROBLEMS  UNUSUAL RASH Items with * indicate a potential emergency and should be followed up as soon as possible.  Feel free to call the clinic should you have any questions or concerns. The clinic phone number is [phone].  Please show the CHEMO ALERT CARD at check-in to the Emergency Department and triage nurse.   

## 2020-04-20 NOTE — Progress Notes (Signed)
Patient was observed for 30 minutes post infusion with no complaints.

## 2020-04-20 NOTE — Patient Instructions (Signed)

## 2020-04-20 NOTE — Progress Notes (Signed)
Witnessed waste of 0.32ml of ativan by Jettie Booze, RN.

## 2020-04-20 NOTE — Progress Notes (Signed)
[place]  [phone] [phone]     ID: Felicia [name] DOB: Jul 20, 1950  MR#: [id number]  BZJ#:[id number]  Patient Care Team: Kathyrn Lass, MD as PCP - General (Family Medicine) Nahser, [name], MD as PCP - Cardiology (Cardiology) Mauro Kaufmann, RN as Oncology Nurse Navigator Rockwell Germany, RN as Oncology Nurse Navigator Rolm Bookbinder, MD as Consulting Physician (General Surgery) Magrinat, Virgie Dad, MD as Consulting Physician (Oncology) Eppie Gibson, MD as Attending Physician (Radiation Oncology) [name], MD as Consulting Physician (Rheumatology) [name], MD as Consulting Physician (Orthopedic Surgery) Janyth Pupa, DO as Consulting Physician (Obstetrics and Gynecology) Larey Dresser, MD as Consulting Physician (Cardiology) Madelin Rear, MD as Consulting Physician (Endocrinology) Scot Dock, NP OTHER MD:  CHIEF COMPLAINT: Triple positive invasive breast cancer  CURRENT TREATMENT: T-DM1   INTERVAL HISTORY: Felicia [name] returns today for follow up and treatment of her triple positive invasive breast cancer.  She is accompanied by her daughter [name] who will be driving her home.  She was switched to T-DM1 at her last visit on 02/16/2020.  She is tolerating this much better.  He receives Lorazepam prior to treatments to help with restless legs.  She has had an MRI of her brain on 04/11/2020 that was negative.    Her most recent echocardiogram from 01/17/2020 showed a normal EF of 60-65%.    REVIEW OF SYSTEMS: Keymani has experienced shoulder pain and some numbness in her right fingertips and has an upcoming MRI of the shoulder.  She is undergoing radiation therapy daily.  She has received cortisone in her shoulder and is following up with Dr. Rip Harbour.  She was recently prescribed tramadol for her shoulder pain, and has not yet picked it up.    Otherwise, she is doing well.  She denies any new concerns today  and a detailed ROS was otherwise non contributory.     COVID 19 VACCINATION STATUS: Refuses vaccination   HISTORY OF CURRENT ILLNESS: From the original intake note:  Felicia [name] had routine screening mammography on 08/03/2019 showing a possible abnormality in the left breast. She underwent left diagnostic mammography with tomography and left breast ultrasonography at [facility] on 08/17/2019 showing: breast density category B; 1.6 cm upper-outer left breast mass; no significant left axillary abnormalities.  Accordingly on 08/30/2019 she proceeded to biopsy of the left breast area in question. The pathology from this procedure ([id number]) showed: invasive mammary carcinoma, e-cadherin positive, grade 2. Prognostic indicators significant for: estrogen receptor, 100% positive and progesterone receptor, 40% positive, both with strong staining intensity. Proliferation marker Ki67 at 20%. HER2 equivocal by immunohistochemistry (2+), but negative by fluorescent in situ hybridization with a signals ratio   and number per cell  .  Of note, she also has a history of FIGO grade 3, stage IA endometrial carcinoma, diagnosed in 2014 in [place]. She underwent robotic assisted total hysterectomy with BSO and pelvic and perirectal aortic lymph node dissection as well as omental biopsy.  Tumor was found only in the endometrium arising in a polyp measuring 1.7 cm.  There was no myometrial invasion no lymphovascular space invasion, and no lower uterine segment involvement.  The cervix and adnexa, lymph nodes and omentum were all not involved.  She then received vaginal brachytherapy, no chemotherapy, completing treatment in the springtime of 2015. [Per patient's report, chemotherapy was suggested, but she opted against this.]  The patient's subsequent history is as detailed below.   PAST MEDICAL HISTORY: Past Medical History:  Diagnosis Date  . Anemia   . Anxiety   . Breast cancer ([place]) 09/2019   left breast  [facility]  . CAD (coronary artery disease)    a. 3/2007s/p DES to the LAD [facility]); b. 01/2017 MV: EF 80%, small, mild apical ant defect w/o ischemia (felt to be breast atten). Low risk.  . Depression   . DM (diabetes mellitus) ([place])   . Essential hypertension   . GERD (gastroesophageal reflux disease)   . Headache    secondary to neck surgery per patient  . High triglycerides   . Hyperlipidemia   . Hypothyroid   . OA (osteoarthritis)    right knee,hands  . Overflow incontinence   . PONV (postoperative nausea and vomiting)    s/p gallbladder   . RLS (restless legs syndrome)   . Urinary urgency   . Uterine cancer ([place]) dx'd 2014    PAST SURGICAL HISTORY: Past Surgical History:  Procedure Laterality Date  . BREAST LUMPECTOMY WITH RADIOACTIVE SEED AND SENTINEL LYMPH NODE BIOPSY Left 10/07/2019   Procedure: LEFT BREAST LUMPECTOMY WITH RADIOACTIVE SEED AND SENTINEL LYMPH NODE BIOPSY;  Surgeon: Rolm Bookbinder, MD;  Location: [place];  Service: General;  Laterality: Left;  . CORONARY STENT PLACEMENT    . CORONARY/GRAFT ACUTE MI REVASCULARIZATION    . FINGER ARTHRODESIS Right 06/11/2019   Procedure: ARTHRODESIS INDEX FINGER DISTAL PHALANGEAL JOINT;  Surgeon: [name], MD;  Location: [place];  Service: Orthopedics;  Laterality: Right;  . FOOT SURGERY Left   . LEFT HEART CATH AND CORONARY ANGIOGRAPHY N/A 02/16/2018   Procedure: LEFT HEART CATH AND CORONARY ANGIOGRAPHY;  Surgeon: Lorretta Harp, MD;  Location: [place] [facility];  Service: Cardiovascular;  Laterality: N/A;  . PORTACATH PLACEMENT Right 11/23/2019   Procedure: INSERTION PORT-A-CATH WITH ULTRASOUND GUIDANCE;  Surgeon: Rolm Bookbinder, MD;  Location: [place];  Service: General;  Laterality: Right;  . REPLACEMENT TOTAL KNEE Right   . SPINE SURGERY    . TOTAL ABDOMINAL HYSTERECTOMY     FOR UTERUS CANCER  . TOTAL HIP ARTHROPLASTY Left     FAMILY HISTORY: Family  History  Problem Relation Age of Onset  . AAA (abdominal aortic aneurysm) Mother   . Diabetes Father   . Alzheimer's disease Father   . Throat cancer Sister   . Breast cancer Cousin    Her father died at age 33 from Alzheimer's. Her mother died at age 34 from a ruptured abdominal aneurysm. Shy has 2 brothers and 3 sisters. She reports breast cancer in a paternal cousin. She also notes throat cancer in her sister.   GYNECOLOGIC HISTORY:  No LMP recorded. Patient has had a hysterectomy. Menarche: 70 years old Age at first live birth: 70 years old [place] P 2 LMP 1996 Contraceptive: prior use without issue HRT never used  Hysterectomy? Yes, 2014 BSO? 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  She unpacks 40 pounds boxes of chicken, has to cook them in 13 pound cooking pans, and is generally on her feet at work but does not otherwise exercise.  She is divorced. She lives at home by herself, with no pets. Daughter Pershing Proud, age 72, is a radiology tech in [place]. Daughter Martyn Malay, age 20, is an Optometrist in [place], [place]. Emile has one grandchild. She is not a Ambulance person.    ADVANCED DIRECTIVES: Not in place; intends to name daughter [name]  as her HCPOA.   HEALTH MAINTENANCE: Social History   Tobacco Use  . Smoking status: Never Smoker  . Smokeless tobacco: Never Used  Vaping Use  . Vaping Use: Never used  Substance Use Topics  . Alcohol use: Yes    Comment: OCCASIONALLY  . Drug use: No     Colonoscopy: <10 years ago, in [place]  PAP: 2014?  Bone density: date unsure, also in [place]   Allergies  Allergen Reactions  . Chloraprep One Step [Chlorhexidine Gluconate] Rash  . Estrogens Other (See Comments)    PATIENT HAS A HISTORY OF CANCER AND HAS BEEN TOLD TO NEVER TAKE ANYTHING CONTAINING ESTROGEN, AS IT MIGHT CAUSE A RECURRENCE  . Shrimp [Shellfish Allergy] Nausea And Vomiting    Current Outpatient  Medications  Medication Sig Dispense Refill  . ACCU-CHEK GUIDE test strip 1 each by Other route as needed.    . Ascorbic Acid (VITAMIN C PO) Take 1 tablet by mouth daily.    Marland Kitchen aspirin EC 81 MG tablet Take 81 mg by mouth daily.    Marland Kitchen atorvastatin (LIPITOR) 40 MG tablet TAKE 1 TABLET BY MOUTH EVERY DAY 90 tablet 1  . bisoprolol (ZEBETA) 5 MG tablet TAKE 1 TABLET BY MOUTH EVERY DAY 90 tablet 2  . butalbital-aspirin-caffeine (FIORINAL) 50-325-40 MG capsule Take 1 capsule by mouth 4 (four) times daily as needed.    . Calcium Citrate-Vitamin D (CALCIUM + D PO) Take 1 tablet by mouth daily with supper.    Marland Kitchen CINNAMON PO Take 1 capsule by mouth daily with supper.    . diclofenac sodium (VOLTAREN) 1 % GEL Apply 2-4 g topically 4 (four) times daily as needed (as directed for pain).    Marland Kitchen diclofenac Sodium (VOLTAREN) 1 % GEL Apply 1 application topically 3 (three) times daily as needed.    . Dulaglutide (TRULICITY) 1.5 MG/0.5ML SOPN Inject 1.5 mg into the skin once a week.    . escitalopram (LEXAPRO) 10 MG tablet Take 10 mg by mouth daily.    . Ferrous Sulfate (IRON PO) Take 1 tablet by mouth daily. alternates days:1 tablet one day and 2 tablets the next day    . ferrous sulfate 325 (65 FE) MG EC tablet Take 325 mg by mouth 3 (three) times daily with meals.    . gabapentin (NEURONTIN) 300 MG capsule Take 1 capsule (300 mg total) by mouth at bedtime. 90 capsule 4  . GLIPIZIDE XL 10 MG 24 hr tablet Take 20 mg by mouth daily.   3  . JARDIANCE 25 MG TABS tablet Take 25 mg by mouth every morning.   3  . levothyroxine (SYNTHROID, LEVOTHROID) 125 MCG tablet Take 125 mcg by mouth daily before breakfast.    . metFORMIN (GLUCOPHAGE) 500 MG tablet Take 500 mg by mouth 2 (two) times daily with a meal.    . metroNIDAZOLE (METROGEL) 1 % gel Apply 1 application topically as needed (as directed to affected area).     . Multiple Vitamins-Calcium (ONE-A-DAY WOMENS PO) Take 1 tablet by mouth daily.    . nitroGLYCERIN  (NITROSTAT) 0.4 MG SL tablet Place 1 tablet (0.4 mg total) under the tongue every 5 (five) minutes as needed for chest pain. 25 tablet 6  . omeprazole (PRILOSEC) 40 MG capsule Take 40 mg by mouth daily with supper.     . prochlorperazine (COMPAZINE) 10 MG tablet Take 1 tablet by mouth before meals and at bedtime on days 2 and 3 after chemotherapy (counting chemotherapy  day as day 1); after that may take as needed 30 tablet 1  . rOPINIRole (REQUIP) 0.5 MG tablet Take 0.5 mg by mouth at bedtime. 1-3 hours prior to bedtime    . tiZANidine (ZANAFLEX) 4 MG tablet Take 4 mg by mouth 3 (three) times daily. As needed    . TURMERIC PO Take 1 capsule by mouth daily with supper.     No current facility-administered medications for this visit.    OBJECTIVE: White woman who appears stated age  31:   04/20/20 1003  BP: 117/80  Pulse: 85  Resp: 18  Temp: 97.7 F (36.5 C)  SpO2: 96%     Body mass index is 29.28 kg/m.   Wt Readings from Last 3 Encounters:  04/20/20 170 lb 9.6 oz (77.4 kg)  03/29/20 168 lb 9.6 oz (76.5 kg)  03/21/20 171 lb 3.2 oz (77.7 kg)      ECOG FS:1 - Symptomatic but completely ambulatory GENERAL: Patient is a well appearing female in no acute distress HEENT:  Sclerae anicteric.  Oropharynx clear and moist. No ulcerations or evidence of oropharyngeal candidiasis. Neck is supple.  NODES:  No cervical, supraclavicular, or axillary lymphadenopathy palpated.  BREAST EXAM:  Deferred.  Has small rash underneath breast consistent with her typical fungal rash LUNGS:  Clear to auscultation bilaterally.  No wheezes or rhonchi. HEART:  Regular rate and rhythm. No murmur appreciated. ABDOMEN:  Soft, nontender.  Positive, normoactive bowel sounds. No organomegaly palpated. MSK:  No focal spinal tenderness to palpation. Full range of motion bilaterally in the upper extremities. EXTREMITIES:  No peripheral edema.   SKIN:  Clear with no obvious rashes or skin changes. No nail  dyscrasia. NEURO:  Nonfocal. Well oriented.  Appropriate affect.     LAB RESULTS:  CMP     Component Value Date/Time   NA 139 04/20/2020 0934   NA 139 11/25/2018 1053   K 4.2 04/20/2020 0934   CL 101 04/20/2020 0934   CO2 22 04/20/2020 0934   GLUCOSE 305 (H) 04/20/2020 0934   BUN 14 04/20/2020 0934   BUN 18 11/25/2018 1053   CREATININE 0.79 04/20/2020 0934   CALCIUM 9.7 04/20/2020 0934   PROT 7.5 04/20/2020 0934   PROT 7.1 11/25/2018 1053   ALBUMIN 3.9 04/20/2020 0934   ALBUMIN 4.2 11/25/2018 1053   AST 31 04/20/2020 0934   ALT 32 04/20/2020 0934   ALKPHOS 71 04/20/2020 0934   BILITOT 0.4 04/20/2020 0934   GFRNONAA >60 04/20/2020 0934   GFRAA >60 10/04/2019 1330   GFRAA >60 09/08/2019 0811    No results found for: TOTALPROTELP, ALBUMINELP, A1GS, A2GS, BETS, BETA2SER, GAMS, MSPIKE, SPEI  Lab Results  Component Value Date   WBC 7.7 04/20/2020   NEUTROABS 4.4 04/20/2020   HGB 13.9 04/20/2020   HCT 42.2 04/20/2020   MCV 99.8 04/20/2020   PLT 183 04/20/2020    No results found for: LABCA2  No components found for: [id number]  No results for input(s): INR in the last 168 hours.  No results found for: LABCA2  No results found for: LMB867  No results found for: JQG920  No results found for: FEO712  No results found for: CA2729  No components found for: HGQUANT  No results found for: CEA1 / No results found for: CEA1   No results found for: AFPTUMOR  No results found for: CHROMOGRNA  No results found for: KPAFRELGTCHN, LAMBDASER, KAPLAMBRATIO (kappa/lambda light chains)  No results found for: HGBA, HGBA2QUANT, HGBFQUANT,  HGBSQUAN (Hemoglobinopathy evaluation)   No results found for: LDH  No results found for: IRON, TIBC, IRONPCTSAT (Iron and TIBC)  No results found for: FERRITIN  Urinalysis No results found for: COLORURINE, APPEARANCEUR, LABSPEC, PHURINE, GLUCOSEU, HGBUR, BILIRUBINUR, KETONESUR, PROTEINUR, UROBILINOGEN, NITRITE,  LEUKOCYTESUR   STUDIES: MR Brain W Wo Contrast  Result Date: 04/11/2020 CLINICAL DATA:  Breast cancer, staging.  Endometrial cancer. EXAM: MRI HEAD WITHOUT AND WITH CONTRAST TECHNIQUE: Multiplanar, multiecho pulse sequences of the brain and surrounding structures were obtained without and with intravenous contrast. CONTRAST:  59mL GADAVIST GADOBUTROL 1 MMOL/ML IV SOLN COMPARISON:  None. FINDINGS: Brain: No acute infarction, hemorrhage, hydrocephalus, extra-axial collection or mass lesion. Scattered foci of T2 hyperintensity are seen within the white matter of the cerebral hemispheres, nonspecific, most likely related to chronic small vessel ischemia. No focus of abnormal contrast enhancement. Vascular: Normal flow voids. Skull and upper cervical spine: Normal marrow signal. Sinuses/Orbits: Negative. IMPRESSION: 1. No evidence of intracranial metastatic disease. 2. Mild chronic small vessel ischemia. Electronically Signed   By: Baldemar Lenis M.D.   On: 04/11/2020 12:36     ELIGIBLE FOR AVAILABLE RESEARCH PROTOCOL: AET  ASSESSMENT: 70 y.o. [place] woman status post left breast upper outer quadrant biopsy 08/30/2019 for a clinical T1c N0, stage IA invasive ductal carcinoma, grade 2, estrogen and progesterone receptor positive, HER-2 amplified, with an MIB-1 of 20%.  (0) status post TAH/BSO 2014 for a FIGO grade 3, stage IA endometrial carcinoma, status post vaginal brachytherapy, no adjuvant chemotherapy  (1) status post left lumpectomy and axillary sentinel node sampling 10/07/2019 for a pT1c pN1, stage IB invasive ductal carcinoma, grade 2, with negative margins.  (a) a total of 1 sentinel lymph node was removed.  (2) adjuvant chemotherapy consisting of carboplatin, gemcitabine, trastuzumab given every 3 weeks x 6 started 11/24/2019, last dose 01/26/2020  (a) baseline echo 09/15/2019 shows an ejection fraction in the 60-65% range.  (b) will not use docetaxel secondary to severe  peripheral neuropathy present at baseline  (c) echo on 01/17/2020 shows EF of 60-65%  (d) chemotherapy discontinued after 4 cycles with poor tolerance.  (3) T-DM1 started 02/16/2020, to be continued through November 2022  (a) echo  (4) adjuvant radiation pending  (5) antiestrogens to start at the completion of local treatment   PLAN: Akelia is doing well today.  She has no signs of breast cancer recurrence and is tolerating treatment with TDM1 quite well.  She is due for repeat echocardiogram and an order for that has been placed.  My nurse has reached out to schedule this.    Vaudie and I talked about her activities and her shoulder.  She will continue to f/u with Dr. Althea Charon about her shoulder.  She will return in 3 weeks for labs and TDM1 and in 6 weeks for labs, f/u and TDM1.    I spent approximately 20 minutes  Reviewing the assessment plan with patient, recommendations, and in the visit.  I also spent that time documenting the note above.    Lillard Anes, NP 04/20/20 10:26 AM Medical Oncology and Hematology [facility] [address] [place], [place] [address] Tel. [phone]    Fax. [phone]    *Total Encounter Time as defined by the Centers for Medicare and Medicaid Services includes, in addition to the face-to-face time of a patient visit (documented in the note above) non-face-to-face time: obtaining and reviewing outside history, ordering and reviewing medications, tests or procedures, care coordination (communications with  other health care professionals or caregivers) and documentation in the medical record.

## 2020-04-20 NOTE — Progress Notes (Signed)
0.10mL of ativan wasted in stericycle at 12pm. Witnessed by Lonn Georgia, RN.

## 2020-04-21 ENCOUNTER — Ambulatory Visit
Admission: RE | Admit: 2020-04-21 | Discharge: 2020-04-21 | Disposition: A | Discharge status: Home / Self Care | Payer: Medicare Other | Source: Ambulatory Visit | Attending: Radiation Oncology | Admitting: Radiation Oncology

## 2020-04-21 ENCOUNTER — Other Ambulatory Visit: Payer: Self-pay

## 2020-04-21 DIAGNOSIS — C50412 Malignant neoplasm of upper-outer quadrant of left female breast: Secondary | ICD-10-CM | POA: Diagnosis not present

## 2020-04-21 DIAGNOSIS — Z170 Estrogen receptor positive status [ER+]: Secondary | ICD-10-CM | POA: Diagnosis not present

## 2020-04-22 DIAGNOSIS — M25511 Pain in right shoulder: Secondary | ICD-10-CM | POA: Diagnosis not present

## 2020-04-24 ENCOUNTER — Ambulatory Visit
Admission: RE | Admit: 2020-04-24 | Discharge: 2020-04-24 | Disposition: A | Discharge status: Home / Self Care | Payer: Medicare Other | Source: Ambulatory Visit | Attending: Radiation Oncology | Admitting: Radiation Oncology

## 2020-04-24 ENCOUNTER — Other Ambulatory Visit: Payer: Self-pay

## 2020-04-24 DIAGNOSIS — Z170 Estrogen receptor positive status [ER+]: Secondary | ICD-10-CM | POA: Diagnosis not present

## 2020-04-24 DIAGNOSIS — C50412 Malignant neoplasm of upper-outer quadrant of left female breast: Secondary | ICD-10-CM | POA: Diagnosis not present

## 2020-04-25 ENCOUNTER — Ambulatory Visit
Admission: RE | Admit: 2020-04-25 | Discharge: 2020-04-25 | Disposition: A | Discharge status: Home / Self Care | Payer: Medicare Other | Source: Ambulatory Visit | Attending: Radiation Oncology | Admitting: Radiation Oncology

## 2020-04-25 DIAGNOSIS — Z170 Estrogen receptor positive status [ER+]: Secondary | ICD-10-CM | POA: Diagnosis not present

## 2020-04-25 DIAGNOSIS — C50412 Malignant neoplasm of upper-outer quadrant of left female breast: Secondary | ICD-10-CM

## 2020-04-25 MED ORDER — SILVER SULFADIAZINE 1 % EX CREA: TOPICAL_CREAM | Freq: Two times a day (BID) | CUTANEOUS | Status: DC

## 2020-04-26 ENCOUNTER — Other Ambulatory Visit: Payer: Self-pay

## 2020-04-26 ENCOUNTER — Ambulatory Visit
Admission: RE | Admit: 2020-04-26 | Discharge: 2020-04-26 | Disposition: A | Discharge status: Home / Self Care | Payer: Medicare Other | Source: Ambulatory Visit | Attending: Radiation Oncology | Admitting: Radiation Oncology

## 2020-04-26 DIAGNOSIS — Z170 Estrogen receptor positive status [ER+]: Secondary | ICD-10-CM | POA: Diagnosis not present

## 2020-04-26 DIAGNOSIS — C50412 Malignant neoplasm of upper-outer quadrant of left female breast: Secondary | ICD-10-CM | POA: Diagnosis not present

## 2020-04-27 ENCOUNTER — Ambulatory Visit
Admission: RE | Admit: 2020-04-27 | Discharge: 2020-04-27 | Disposition: A | Discharge status: Home / Self Care | Payer: Medicare Other | Source: Ambulatory Visit | Attending: Radiation Oncology | Admitting: Radiation Oncology

## 2020-04-27 DIAGNOSIS — Z170 Estrogen receptor positive status [ER+]: Secondary | ICD-10-CM | POA: Diagnosis not present

## 2020-04-27 DIAGNOSIS — C50412 Malignant neoplasm of upper-outer quadrant of left female breast: Secondary | ICD-10-CM | POA: Diagnosis not present

## 2020-04-27 NOTE — Progress Notes (Signed)
[place] Patient and [facility] Counseling Note  The patient arrived late due to radiation, which was expected. The patient and counselor discussed patient's emotions surrounding radiation, her job search/financial concerns, and how the patient was taking care of herself physically. The counselor collaborated with the patient to identify signs that the patient may be struggling and when to know to seek help in the future. The counselor provided active listening, validation of feelings, and reflections. The patient was oriented times three, in a stressed mood, and had a congruent affect. There were no indications of SI/HI/NSSI. The patient is adjusting to her new treatment. Thinking about bills (I.e "Can I pay all my bills/stay afloat?" has resulted in distress and worry for the patient. Combined with the stress and fatigue of chemo, patient had retreated from the world and was depressed. As treatment improves and patient learns skills, she will continue to feel better. Patient reported still feeling better, despite additional stress from being unemployed. Next session will discuss how to seek help in the future and process the end of the therapeutic relationship.   Gaylyn Rong Counseling Intern

## 2020-04-28 ENCOUNTER — Other Ambulatory Visit: Payer: Self-pay

## 2020-04-28 ENCOUNTER — Ambulatory Visit
Admission: RE | Admit: 2020-04-28 | Discharge: 2020-04-28 | Disposition: A | Discharge status: Home / Self Care | Payer: Medicare Other | Source: Ambulatory Visit | Attending: Radiation Oncology | Admitting: Radiation Oncology

## 2020-04-28 DIAGNOSIS — Z170 Estrogen receptor positive status [ER+]: Secondary | ICD-10-CM | POA: Diagnosis not present

## 2020-04-28 DIAGNOSIS — C50412 Malignant neoplasm of upper-outer quadrant of left female breast: Secondary | ICD-10-CM | POA: Diagnosis not present

## 2020-04-29 ENCOUNTER — Encounter (HOSPITAL_COMMUNITY): Payer: Self-pay | Admitting: Emergency Medicine

## 2020-04-29 ENCOUNTER — Other Ambulatory Visit: Payer: Self-pay

## 2020-04-29 ENCOUNTER — Observation Stay (HOSPITAL_COMMUNITY)
Admission: EM | Admit: 2020-04-29 | Discharge: 2020-05-01 | Disposition: A | Discharge status: Home / Self Care | Payer: Medicare Other | Attending: Internal Medicine | Admitting: Internal Medicine

## 2020-04-29 ENCOUNTER — Emergency Department (HOSPITAL_COMMUNITY): Payer: Medicare Other

## 2020-04-29 DIAGNOSIS — Z79899 Other long term (current) drug therapy: Secondary | ICD-10-CM | POA: Diagnosis not present

## 2020-04-29 DIAGNOSIS — Z96642 Presence of left artificial hip joint: Secondary | ICD-10-CM | POA: Diagnosis not present

## 2020-04-29 DIAGNOSIS — Z20822 Contact with and (suspected) exposure to covid-19: Secondary | ICD-10-CM | POA: Diagnosis not present

## 2020-04-29 DIAGNOSIS — C50412 Malignant neoplasm of upper-outer quadrant of left female breast: Secondary | ICD-10-CM

## 2020-04-29 DIAGNOSIS — R0789 Other chest pain: Principal | ICD-10-CM | POA: Insufficient documentation

## 2020-04-29 DIAGNOSIS — L8990 Pressure ulcer of unspecified site, unspecified stage: Secondary | ICD-10-CM | POA: Diagnosis not present

## 2020-04-29 DIAGNOSIS — Z9861 Coronary angioplasty status: Secondary | ICD-10-CM | POA: Diagnosis not present

## 2020-04-29 DIAGNOSIS — Z853 Personal history of malignant neoplasm of breast: Secondary | ICD-10-CM | POA: Diagnosis not present

## 2020-04-29 DIAGNOSIS — Z170 Estrogen receptor positive status [ER+]: Secondary | ICD-10-CM

## 2020-04-29 DIAGNOSIS — K148 Other diseases of tongue: Secondary | ICD-10-CM | POA: Insufficient documentation

## 2020-04-29 DIAGNOSIS — Z7984 Long term (current) use of oral hypoglycemic drugs: Secondary | ICD-10-CM

## 2020-04-29 DIAGNOSIS — I10 Essential (primary) hypertension: Secondary | ICD-10-CM | POA: Insufficient documentation

## 2020-04-29 DIAGNOSIS — F32A Depression, unspecified: Secondary | ICD-10-CM | POA: Diagnosis not present

## 2020-04-29 DIAGNOSIS — Z794 Long term (current) use of insulin: Secondary | ICD-10-CM

## 2020-04-29 DIAGNOSIS — R079 Chest pain, unspecified: Secondary | ICD-10-CM | POA: Diagnosis present

## 2020-04-29 DIAGNOSIS — I25110 Atherosclerotic heart disease of native coronary artery with unstable angina pectoris: Secondary | ICD-10-CM | POA: Insufficient documentation

## 2020-04-29 DIAGNOSIS — I2510 Atherosclerotic heart disease of native coronary artery without angina pectoris: Secondary | ICD-10-CM | POA: Diagnosis not present

## 2020-04-29 DIAGNOSIS — Z8542 Personal history of malignant neoplasm of other parts of uterus: Secondary | ICD-10-CM | POA: Insufficient documentation

## 2020-04-29 DIAGNOSIS — E119 Type 2 diabetes mellitus without complications: Secondary | ICD-10-CM

## 2020-04-29 DIAGNOSIS — F419 Anxiety disorder, unspecified: Secondary | ICD-10-CM | POA: Diagnosis present

## 2020-04-29 DIAGNOSIS — E039 Hypothyroidism, unspecified: Secondary | ICD-10-CM | POA: Diagnosis present

## 2020-04-29 DIAGNOSIS — Z7982 Long term (current) use of aspirin: Secondary | ICD-10-CM | POA: Insufficient documentation

## 2020-04-29 DIAGNOSIS — I200 Unstable angina: Secondary | ICD-10-CM | POA: Diagnosis not present

## 2020-04-29 DIAGNOSIS — J9811 Atelectasis: Secondary | ICD-10-CM | POA: Diagnosis not present

## 2020-04-29 LAB — CBC
HCT: 44.9 % (ref 36.0–46.0)
Hemoglobin: 14.8 g/dL (ref 12.0–15.0)
MCH: 32.7 pg (ref 26.0–34.0)
MCHC: 33 g/dL (ref 30.0–36.0)
MCV: 99.3 fL (ref 80.0–100.0)
Platelets: 159 10*3/uL (ref 150–400)
RBC: 4.52 MIL/uL (ref 3.87–5.11)
RDW: 14.1 % (ref 11.5–15.5)
WBC: 6.7 10*3/uL (ref 4.0–10.5)
nRBC: 0 % (ref 0.0–0.2)

## 2020-04-29 LAB — RESP PANEL BY RT-PCR (FLU A&B, COVID) ARPGX2
Influenza A by PCR: NEGATIVE
Influenza B by PCR: NEGATIVE
SARS Coronavirus 2 by RT PCR: NEGATIVE

## 2020-04-29 LAB — TROPONIN I (HIGH SENSITIVITY)
Troponin I (High Sensitivity): 5 ng/L (ref <18)
Troponin I (High Sensitivity): 2 ng/L (ref <18)

## 2020-04-29 LAB — BASIC METABOLIC PANEL
Anion gap: 11 (ref 5–15)
BUN: 10 mg/dL (ref 8–23)
CO2: 24 mmol/L (ref 22–32)
Calcium: 10.2 mg/dL (ref 8.9–10.3)
Chloride: 102 mmol/L (ref 98–111)
Creatinine, Ser: 0.6 mg/dL (ref 0.44–1.00)
GFR, Estimated: >60 mL/min (ref >60)
Glucose, Bld: 158 mg/dL — ABNORMAL HIGH (ref 70–99)
Potassium: 4.3 mmol/L (ref 3.5–5.1)
Sodium: 137 mmol/L (ref 135–145)

## 2020-04-29 MED ORDER — ASPIRIN 81 MG PO CHEW
324.0000 mg | CHEWABLE_TABLET | Freq: Once | ORAL | Status: AC
  Administered 2020-04-29: 324 mg via ORAL
  Filled 2020-04-29: qty 4

## 2020-04-29 MED ORDER — ATORVASTATIN CALCIUM 40 MG PO TABS
40.0000 mg | ORAL_TABLET | Freq: Every day | ORAL | Status: DC
  Administered 2020-04-30: 40 mg via ORAL
  Filled 2020-04-29 (×2): qty 1

## 2020-04-29 MED ORDER — INSULIN ASPART 100 UNIT/ML ~~LOC~~ SOLN
0.0000 [IU] | Freq: Three times a day (TID) | SUBCUTANEOUS | Status: DC
  Administered 2020-04-30 – 2020-05-01 (×2): 1 [IU] via SUBCUTANEOUS

## 2020-04-29 MED ORDER — ASPIRIN EC 81 MG PO TBEC
81.0000 mg | DELAYED_RELEASE_TABLET | Freq: Every day | ORAL | Status: DC
  Administered 2020-04-30 – 2020-05-01 (×2): 81 mg via ORAL
  Filled 2020-04-29 (×2): qty 1

## 2020-04-29 MED ORDER — GABAPENTIN 300 MG PO CAPS
300.0000 mg | ORAL_CAPSULE | Freq: Every day | ORAL | Status: DC
  Administered 2020-04-29 – 2020-04-30 (×2): 300 mg via ORAL
  Filled 2020-04-29 (×2): qty 1

## 2020-04-29 MED ORDER — POLYETHYLENE GLYCOL 3350 17 G PO PACK: 17.0000 g | PACK | Freq: Every day | ORAL | Status: DC | PRN

## 2020-04-29 MED ORDER — ACETAMINOPHEN 325 MG PO TABS
650.0000 mg | ORAL_TABLET | Freq: Four times a day (QID) | ORAL | Status: DC | PRN
  Administered 2020-04-29 – 2020-04-30 (×2): 650 mg via ORAL
  Filled 2020-04-29 (×2): qty 2

## 2020-04-29 MED ORDER — ENOXAPARIN SODIUM 40 MG/0.4ML ~~LOC~~ SOLN: 40.0000 mg | SUBCUTANEOUS | Status: DC

## 2020-04-29 MED ORDER — NITROGLYCERIN 0.4 MG SL SUBL
0.4000 mg | SUBLINGUAL_TABLET | SUBLINGUAL | Status: DC | PRN
  Administered 2020-04-29 – 2020-04-30 (×2): 0.4 mg via SUBLINGUAL
  Filled 2020-04-29 (×2): qty 1

## 2020-04-29 MED ORDER — ESCITALOPRAM OXALATE 10 MG PO TABS
10.0000 mg | ORAL_TABLET | Freq: Every day | ORAL | Status: DC
  Administered 2020-04-29 – 2020-04-30 (×2): 10 mg via ORAL
  Filled 2020-04-29 (×2): qty 1

## 2020-04-29 MED ORDER — TRAMADOL HCL 50 MG PO TABS
50.0000 mg | ORAL_TABLET | Freq: Four times a day (QID) | ORAL | Status: DC | PRN
Start: 2020-04-29 — End: 2020-05-01
  Administered 2020-04-29 – 2020-05-01 (×2): 50 mg via ORAL
  Filled 2020-04-29 (×2): qty 1

## 2020-04-29 MED ORDER — ROPINIROLE HCL 1 MG PO TABS
0.5000 mg | ORAL_TABLET | Freq: Every day | ORAL | Status: DC
  Administered 2020-04-29 – 2020-04-30 (×2): 0.5 mg via ORAL
  Filled 2020-04-29 (×2): qty 1

## 2020-04-29 MED ORDER — SODIUM CHLORIDE 0.9 % IV SOLN: 250.0000 mL | INTRAVENOUS | Status: DC | PRN

## 2020-04-29 MED ORDER — TIZANIDINE HCL 4 MG PO TABS
4.0000 mg | ORAL_TABLET | Freq: Three times a day (TID) | ORAL | Status: DC | PRN
  Administered 2020-04-30 (×2): 4 mg via ORAL
  Filled 2020-04-29 (×2): qty 1

## 2020-04-29 MED ORDER — OXYCODONE HCL 5 MG PO TABS
5.0000 mg | ORAL_TABLET | ORAL | Status: DC | PRN
  Administered 2020-04-29 – 2020-04-30 (×2): 5 mg via ORAL
  Filled 2020-04-29 (×2): qty 1

## 2020-04-29 MED ORDER — IOHEXOL 350 MG/ML SOLN
80.0000 mL | Freq: Once | INTRAVENOUS | Status: AC | PRN
  Administered 2020-04-29: 80 mL via INTRAVENOUS

## 2020-04-29 MED ORDER — NYSTATIN 100000 UNIT/GM EX POWD
Freq: Three times a day (TID) | CUTANEOUS | Status: DC
  Filled 2020-04-29: qty 15

## 2020-04-29 MED ORDER — PANTOPRAZOLE SODIUM 40 MG PO TBEC
40.0000 mg | DELAYED_RELEASE_TABLET | Freq: Every day | ORAL | Status: DC
  Administered 2020-04-30 – 2020-05-01 (×2): 40 mg via ORAL
  Filled 2020-04-29 (×2): qty 1

## 2020-04-29 MED ORDER — ONDANSETRON HCL 4 MG/2ML IJ SOLN
4.0000 mg | Freq: Four times a day (QID) | INTRAMUSCULAR | Status: DC | PRN
Start: 2020-04-29 — End: 2020-05-01

## 2020-04-29 MED ORDER — LEVOTHYROXINE SODIUM 25 MCG PO TABS
125.0000 ug | ORAL_TABLET | Freq: Every day | ORAL | Status: DC
  Administered 2020-04-30 – 2020-05-01 (×2): 125 ug via ORAL
  Filled 2020-04-29 (×2): qty 1

## 2020-04-29 MED ORDER — BISOPROLOL FUMARATE 5 MG PO TABS
5.0000 mg | ORAL_TABLET | Freq: Every day | ORAL | Status: DC
  Administered 2020-04-29 – 2020-04-30 (×2): 5 mg via ORAL
  Filled 2020-04-29 (×3): qty 1

## 2020-04-29 NOTE — ED Notes (Signed)
Sterile non adhesive dressing applied under left breast.

## 2020-04-29 NOTE — H&P (Signed)
History and Physical    Christe Tellez JJH:[id number] DOB: Jan 23, 1950 DOA: 04/29/2020  PCP: Kathyrn Lass, MD   Patient coming from: Home   Chief Complaint: Chest pain   HPI: Felicia [name] is a 70 y.o. female with medical history significant for cancer of the left breast undergoing chemotherapy and radiation, hypothyroidism, type 2 diabetes mellitus, osteoarthritis, and coronary artery disease with LAD stent, presenting to the emergency department for evaluation of chest pain.  Patient reports some chest discomfort that was intermittent yesterday, associated with shortness of breath, onset at rest, and without any alleviating or exacerbating factors identified.  Discomfort became more intense today, she had difficulty taking a deep breath, reports radiation from the left chest to her left shoulder and upper arm, and describes symptoms as similar to what she experienced prior to her LAD stent that there was no radiation at that time.  She denies cough, leg swelling, fevers, chills, or calf tenderness.  She has a small wound and maceration under the left breast for the past 2 weeks but does not believe that is related.  ED Course: Upon arrival to the ED, patient is found to be afebrile, saturating well on room air, and with stable blood pressure.  EKG features sinus rhythm with LAD.  Chest x-ray negative for acute cardiopulmonary disease.  CTA chest negative for PE though assessment of lower lobe branches is limited.  Chemistry panel and CBC are unremarkable and initial troponin normal.  Patient was given 324 mg of aspirin in the ED, and given sublingual nitroglycerin x1 with complete resolution of symptoms.  Review of Systems:  All other systems reviewed and apart from HPI, are negative.  Past Medical History:  Diagnosis Date  . Anemia   . Anxiety   . Breast cancer ([place]) 09/2019   left breast IMC  . CAD (coronary artery disease)    a. 3/2007s/p DES to the LAD [facility]); b. 01/2017 MV: EF  80%, small, mild apical ant defect w/o ischemia (felt to be breast atten). Low risk.  . Depression   . DM (diabetes mellitus) ([place])   . Essential hypertension   . GERD (gastroesophageal reflux disease)   . Headache    secondary to neck surgery per patient  . High triglycerides   . Hyperlipidemia   . Hypothyroid   . OA (osteoarthritis)    right knee,hands  . Overflow incontinence   . PONV (postoperative nausea and vomiting)    s/p gallbladder   . RLS (restless legs syndrome)   . Urinary urgency   . Uterine cancer ([place]) dx'd 2014    Past Surgical History:  Procedure Laterality Date  . BREAST LUMPECTOMY WITH RADIOACTIVE SEED AND SENTINEL LYMPH NODE BIOPSY Left 10/07/2019   Procedure: LEFT BREAST LUMPECTOMY WITH RADIOACTIVE SEED AND SENTINEL LYMPH NODE BIOPSY;  Surgeon: Rolm Bookbinder, MD;  Location: [place];  Service: General;  Laterality: Left;  . CORONARY STENT PLACEMENT    . CORONARY/GRAFT ACUTE MI REVASCULARIZATION    . FINGER ARTHRODESIS Right 06/11/2019   Procedure: ARTHRODESIS INDEX FINGER DISTAL PHALANGEAL JOINT;  Surgeon: [name], MD;  Location: [place];  Service: Orthopedics;  Laterality: Right;  . FOOT SURGERY Left   . LEFT HEART CATH AND CORONARY ANGIOGRAPHY N/A 02/16/2018   Procedure: LEFT HEART CATH AND CORONARY ANGIOGRAPHY;  Surgeon: Lorretta Harp, MD;  Location: [place] [facility];  Service: Cardiovascular;  Laterality: N/A;  . PORTACATH PLACEMENT Right 11/23/2019   Procedure: INSERTION PORT-A-CATH WITH  ULTRASOUND GUIDANCE;  Surgeon: Rolm Bookbinder, MD;  Location: [place];  Service: General;  Laterality: Right;  . REPLACEMENT TOTAL KNEE Right   . SPINE SURGERY    . TOTAL ABDOMINAL HYSTERECTOMY     FOR UTERUS CANCER  . TOTAL HIP ARTHROPLASTY Left     Social History:   reports that she has never smoked. She has never used smokeless tobacco. She reports current alcohol use. She reports that she does  not use drugs.  Allergies  Allergen Reactions  . Chloraprep One Step [Chlorhexidine Gluconate] Rash  . Estrogens Other (See Comments)    PATIENT HAS A HISTORY OF CANCER AND HAS BEEN TOLD TO NEVER TAKE ANYTHING CONTAINING ESTROGEN, AS IT MIGHT CAUSE A RECURRENCE  . Shrimp [Shellfish Allergy] Nausea And Vomiting    Family History  Problem Relation Age of Onset  . AAA (abdominal aortic aneurysm) Mother   . Diabetes Father   . Alzheimer's disease Father   . Throat cancer Sister   . Breast cancer Cousin      Prior to Admission medications   Medication Sig Start Date End Date Taking? Authorizing Provider  Ascorbic Acid (VITAMIN C PO) Take 1 tablet by mouth daily.   Yes [provider]  aspirin EC 81 MG tablet Take 81 mg by mouth daily.   Yes [provider]  atorvastatin (LIPITOR) 40 MG tablet TAKE 1 TABLET BY MOUTH EVERY DAY 04/03/20  Yes Nahser, [name], MD  bisoprolol (ZEBETA) 5 MG tablet TAKE 1 TABLET BY MOUTH EVERY DAY 11/03/19  Yes Nahser, [name], MD  Calcium Citrate-Vitamin D (CALCIUM + D PO) Take 1 tablet by mouth daily with supper.   Yes [provider]  CINNAMON PO Take 1 capsule by mouth daily with supper.   Yes [provider]  diclofenac sodium (VOLTAREN) 1 % GEL Apply 2-4 g topically 4 (four) times daily as needed (as directed for pain).   Yes [provider]  Dulaglutide (TRULICITY) 1.5 TI/1.4ER SOPN Inject 1.5 mg into the skin once a week. 12/01/19  Yes Magrinat, Virgie Dad, MD  escitalopram (LEXAPRO) 10 MG tablet Take 10 mg by mouth at bedtime.   Yes [provider]  Ferrous Sulfate (IRON PO) Take 1 tablet by mouth daily. alternates days:1 tablet one day and 2 tablets the next day   Yes [provider]  gabapentin (NEURONTIN) 300 MG capsule Take 1 capsule (300 mg total) by mouth at bedtime. 03/08/20  Yes Magrinat, Virgie Dad, MD  GLIPIZIDE XL 10 MG 24 hr tablet Take 20 mg by mouth daily.  10/08/17  Yes [provider]  JARDIANCE 25 MG TABS tablet Take 25 mg by mouth every morning.  10/19/17  Yes [provider]  levothyroxine (SYNTHROID, LEVOTHROID) 125 MCG tablet Take 125 mcg by mouth daily before breakfast.   Yes [provider]  metFORMIN (GLUCOPHAGE) 500 MG tablet Take 500 mg by mouth 2 (two) times daily with a meal.   Yes [provider]  metroNIDAZOLE (METROGEL) 1 % gel Apply 1 application topically as needed (as directed to affected area). 04/20/20  Yes Causey, Charlestine Massed, NP  Multiple Vitamins-Calcium (ONE-A-DAY WOMENS PO) Take 1 tablet by mouth daily.   Yes [provider]  nitroGLYCERIN (NITROSTAT) 0.4 MG SL tablet Place 1 tablet (0.4 mg total) under the tongue every 5 (five) minutes as needed for chest pain. 09/16/18  Yes Nahser, [name], MD  omeprazole (PRILOSEC) 40 MG capsule Take 40  mg by mouth daily with supper.    Yes [provider]  prochlorperazine (COMPAZINE) 10 MG tablet Take 1 tablet by mouth before meals and at bedtime on days 2 and 3 after chemotherapy (counting chemotherapy day as day 1); after that may take as needed 02/16/20  Yes Alla Feeling, NP  rOPINIRole (REQUIP) 0.5 MG tablet Take 0.5 mg by mouth at bedtime. 1-3 hours prior to bedtime   Yes [provider]  tiZANidine (ZANAFLEX) 4 MG tablet Take 4 mg by mouth every 8 (eight) hours as needed for muscle spasms. As needed 05/06/19  Yes [provider]  traMADol (ULTRAM) 50 MG tablet Take 50 mg by mouth every 6 (six) hours as needed for moderate pain. 04/19/20  Yes [provider]  TURMERIC PO Take 1 capsule by mouth daily with supper.   Yes [provider]  ACCU-CHEK GUIDE test strip 1 each by Other route as needed. 04/19/19   [provider]    Physical Exam: Vitals:   04/29/20 1800 04/29/20 1815 04/29/20 2041 04/29/20 2122  BP: 117/70 126/70 (!) 145/86 135/90  Pulse: 84 85 82 77  Resp: (!) 21 20 17    Temp:    97.8 F (36.6 C)   TempSrc:    Oral  SpO2: 94% 94% 95% 97%  Weight:      Height:        Constitutional: NAD, calm  Eyes: PERTLA, lids and conjunctivae normal ENMT: Mucous membranes are moist. Posterior pharynx clear of any exudate or lesions.   Neck: normal, supple, no masses, no thyromegaly Respiratory:  no wheezing, no crackles. No accessory muscle use.  Cardiovascular: S1 & S2 heard, regular rate and rhythm. No extremity edema.   Abdomen: No distension, no tenderness, soft. Bowel sounds active.  Musculoskeletal: no clubbing / cyanosis. No joint deformity upper and lower extremities.   Skin: Wound under left breast with scant bloody drainage, surrounding erythema, and maceration. Skin otherwise warm, dry, well-perfused. Neurologic: CN 2-12 grossly intact. Moving all extremities.  Psychiatric: Alert and oriented to person, place, and situation. Very pleasant and cooperative.    Labs and Imaging on Admission: I have personally reviewed following labs and imaging studies  CBC: Recent Labs  Lab 04/29/20 1632  WBC 6.7  HGB 14.8  HCT 44.9  MCV 99.3  PLT 062   Basic Metabolic Panel: Recent Labs  Lab 04/29/20 1632  NA 137  K 4.3  CL 102  CO2 24  GLUCOSE 158*  BUN 10  CREATININE 0.60  CALCIUM 10.2   GFR: Estimated Creatinine Clearance: 65.9 mL/min (by C-G formula based on SCr of 0.6 mg/dL). Liver Function Tests: No results for input(s): AST, ALT, ALKPHOS, BILITOT, PROT, ALBUMIN in the last 168 hours. No results for input(s): LIPASE, AMYLASE in the last 168 hours. No results for input(s): AMMONIA in the last 168 hours. Coagulation Profile: No results for input(s): INR, PROTIME in the last 168 hours. Cardiac Enzymes: No results for input(s): CKTOTAL, CKMB, CKMBINDEX, TROPONINI in the last 168 hours. BNP (last 3 results) No results for input(s): PROBNP in the last 8760 hours. HbA1C: No results for input(s): HGBA1C in the last 72 hours. CBG: No results for input(s): GLUCAP in the last  168 hours. Lipid Profile: No results for input(s): CHOL, HDL, LDLCALC, TRIG, CHOLHDL, LDLDIRECT in the last 72 hours. Thyroid Function Tests: No results for input(s): TSH, T4TOTAL, FREET4, T3FREE, THYROIDAB in the last 72 hours. Anemia Panel: No results for input(s): VITAMINB12, FOLATE,  FERRITIN, TIBC, IRON, RETICCTPCT in the last 72 hours. Urine analysis: No results found for: COLORURINE, APPEARANCEUR, LABSPEC, PHURINE, GLUCOSEU, HGBUR, BILIRUBINUR, KETONESUR, PROTEINUR, UROBILINOGEN, NITRITE, LEUKOCYTESUR Sepsis Labs: @LABRCNTIP (procalcitonin:4,lacticidven:4) ) Recent Results (from the past 240 hour(s))  Resp Panel by RT-PCR (Flu A&B, Covid) Nasopharyngeal Swab     Status: None   Collection Time: 04/29/20  7:17 PM   Specimen: Nasopharyngeal Swab; Nasopharyngeal(NP) swabs in vial transport medium  Result Value Ref Range Status   SARS Coronavirus 2 by RT PCR NEGATIVE NEGATIVE Final    Comment: (NOTE) SARS-CoV-2 target nucleic acids are NOT DETECTED.  The SARS-CoV-2 RNA is generally detectable in upper respiratory specimens during the acute phase of infection. The lowest concentration of SARS-CoV-2 viral copies this assay can detect is 138 copies/mL. A negative result does not preclude SARS-Cov-2 infection and should not be used as the sole basis for treatment or other patient management decisions. A negative result may occur with  improper specimen collection/handling, submission of specimen other than nasopharyngeal swab, presence of viral mutation(s) within the areas targeted by this assay, and inadequate number of viral copies(<138 copies/mL). A negative result must be combined with clinical observations, patient history, and epidemiological information. The expected result is Negative.  Fact Sheet for Patients:  EntrepreneurPulse.com.au  Fact Sheet for Healthcare Providers:  IncredibleEmployment.be  This test is no t yet approved or  cleared by the [place] FDA and  has been authorized for detection and/or diagnosis of SARS-CoV-2 by FDA under an Emergency Use Authorization (EUA). This EUA will remain  in effect (meaning this test can be used) for the duration of the COVID-19 declaration under Section 564(b)(1) of the Act, 21 U.S.C.section 360bbb-3(b)(1), unless the authorization is terminated  or revoked sooner.       Influenza A by PCR NEGATIVE NEGATIVE Final   Influenza B by PCR NEGATIVE NEGATIVE Final    Comment: (NOTE) The Xpert Xpress SARS-CoV-2/FLU/RSV plus assay is intended as an aid in the diagnosis of influenza from Nasopharyngeal swab specimens and should not be used as a sole basis for treatment. Nasal washings and aspirates are unacceptable for Xpert Xpress SARS-CoV-2/FLU/RSV testing.  Fact Sheet for Patients: EntrepreneurPulse.com.au  Fact Sheet for Healthcare Providers: IncredibleEmployment.be  This test is not yet approved or cleared by the [place] FDA and has been authorized for detection and/or diagnosis of SARS-CoV-2 by FDA under an Emergency Use Authorization (EUA). This EUA will remain in effect (meaning this test can be used) for the duration of the COVID-19 declaration under Section 564(b)(1) of the Act, 21 U.S.C. section 360bbb-3(b)(1), unless the authorization is terminated or revoked.  Performed at [facility], [place] [address] [place], [place] [address]      Radiological Exams on Admission: CT Angio Chest PE W/Cm &/Or Wo Cm  Result Date: 04/29/2020 CLINICAL DATA:  Left-sided chest pain EXAM: CT ANGIOGRAPHY CHEST WITH CONTRAST TECHNIQUE: Multidetector CT imaging of the chest was performed using the standard protocol during bolus administration of intravenous contrast. Multiplanar CT image reconstructions and MIPs were obtained to evaluate the vascular anatomy. CONTRAST:  34mL OMNIPAQUE IOHEXOL 350 MG/ML SOLN  COMPARISON:  Chest x-ray from earlier in the same day. FINDINGS: Cardiovascular: Atherosclerotic calcifications of the thoracic aorta are noted. Mild dilatation of the ascending aorta is seen to 3.7 cm. Normal tapering in the aortic arch is noted. No dissection is seen. Heart is at the upper limits of normal in size. Heavy coronary calcifications are noted. The pulmonary artery shows a  normal branching pattern without definitive intraluminal filling defect to suggest pulmonary embolism. Peripheral branches in the lower lobes are somewhat limited due to motion artifact. Mediastinum/Nodes: Thoracic inlet is within normal limits. Right chest wall port is seen in satisfactory position. No sizable hilar or mediastinal adenopathy is noted. The esophagus as visualized is within normal limits. Lungs/Pleura: Lungs are well aerated bilaterally with mild dependent atelectatic changes as well as diffuse emphysematous changes. No focal infiltrate or sizable effusion is seen. No sizable parenchymal nodules are noted. Upper Abdomen: Changes of prior cholecystectomy are seen. No acute upper abdominal abnormality is noted. Musculoskeletal: Degenerative changes of the thoracic spine are noted. Postsurgical changes in the cervical spine are seen as well. Review of the MIP images confirms the above findings. IMPRESSION: No definitive pulmonary emboli although the lower lobe branches are somewhat limited due to timing of the contrast bolus and mild motion artifact. Dilatation of the ascending aorta 3.7 cm. No specific follow-up is recommended at this time. Bibasilar atelectasis. Aortic Atherosclerosis (ICD10-I70.0) and Emphysema (ICD10-J43.9). Electronically Signed   By: Inez Catalina M.D.   On: 04/29/2020 18:50   DG Chest Portable 1 View  Result Date: 04/29/2020 CLINICAL DATA:  Chest pain. EXAM: PORTABLE CHEST 1 VIEW COMPARISON:  February 14, 2018 FINDINGS: The heart size and mediastinal contours are within normal limits. Right  central venous line is identified with distal tip in the superior vena cava. Both lungs are clear. The visualized skeletal structures are unremarkable. IMPRESSION: No active disease. Electronically Signed   By: Abelardo Diesel M.D.   On: 04/29/2020 15:51    EKG: Independently reviewed. Sinus rhythm, LAD.   Assessment/Plan   1. Chest pain; CAD  - Patient with hx of CAD presenting with 2 days of progressive chest pain, relieved with NTG in ED  - No acute ischemic features noted on EKG, initial troponin wnl, and no acute findings on CXR  - LHC 2 yrs ago with widely patent LAD stent and no other CAD  - Continue cardiac monitoring, continue ASA and statin, continue as needed NTG, consult cardiology   2. Breast cancer  - Left breast cancer s/p lumpectomy undergoing adjuvant chemotherapy and radiation  - Oncologist added to treatment team as notification of patient's admission   3. Type II DM  - No A1c available, serum glucose 158 in ED  - Check CBGs and use a low-intensity SSI for now   4. Hypothyroidism  - Continue Synthroid   5. Depression, anxiety  - Continue Lexapro    6. Chronic pain; RLS  - Continue gabapentin, as-needed Zanaflex and tramadol, Requip    DVT prophylaxis: Lovenox  Code Status: Full  Level of Care: Level of care: Telemetry Family Communication: None present  Disposition Plan:  Patient is from: Home Anticipated d/c is to: Home  Anticipated d/c date is: 04/30/20 Patient currently: Pending second troponin, cardiac monitoring, possible cardiology consultation  Consults called: Message sent to cardiology with routine consult request  Admission status: Observation     Vianne Bulls, MD [facility]  04/29/2020, 9:50 PM

## 2020-04-29 NOTE — ED Triage Notes (Signed)
Patient presents with chest pain/ pressure which radiates from the central chest to the left side to the back. Pain begin "a little while" ago, but worse today. Thursday and ECHO is to be done. She also complains that "it hurst to breath deep."

## 2020-04-29 NOTE — ED Triage Notes (Signed)
Emergency Medicine Provider Triage Evaluation Note  Felicia [name] , a 70 y.o. female  was evaluated in triage.  Pt complains of central chest pain that radiates to her left chest, left flank and back.  Patient reports pain began yesterday afternoon has been constant since then.  Patient describes pain as pressure.  Patient endorses associated shortness of breath.  Patient denies associated nausea, vomiting, diaphoresis.  Patient also endorses pain with deep inhalation.  Patient has malignant neoplasm of upper-outer quadrant of left breast in female.  Is currently undergoing chemotherapy and radiation.  Last chemotherapy 04/20/2020 and last radiation treatment was yesterday.   Review of Systems  Positive: Chest pain, shob, pain with inhalation, chills Negative: Nausea, vomiting, diaphoresis, leg swelling, hemoptysis  Physical Exam  BP 129/79   Pulse 81   Temp 97.8 F (36.6 C)   Resp (!) 33   Ht 5\' 4"  (1.626 m)   Wt 77.4 kg   SpO2 99%   BMI 29.28 kg/m  Gen:   Awake, no distress   HEENT:  Atraumatic  Resp:  Normal effort, clear to auscultation bilaterally, tachypnea, able to speak in full complete sentences Cardiac:  Normal rate  MSK:   Moves extremities without difficulty  Neuro:  Speech clear   Medical Decision Making  Medically screening exam initiated at 3:27 PM.  Appropriate orders placed.  Imunique Samad was informed that the remainder of the evaluation will be completed by another provider, this initial triage assessment does not replace that evaluation, and the importance of remaining in the ED until their evaluation is complete.  Clinical Impression   The patient appears stable so that the remainder of the work up may be completed by another provider.      Loni Beckwith, [place] 04/29/20 1531

## 2020-04-29 NOTE — ED Notes (Signed)
A&Ox 4 patient has no complaints of pain at this time. Patient is being transferred to floor by wheelchair.

## 2020-04-29 NOTE — ED Provider Notes (Signed)
[name] DEPT Provider Note   CSN: [id number] Arrival date & time: 04/29/20  1501     History Chief Complaint  Patient presents with  . Chest Pain  . Shortness of Breath    Felicia [name] is a 70 y.o. female.  She has a history of breast cancer is active with chemotherapy and radiation to her left chest.  She is complaining of on and off chest pressure since yesterday radiating into her left axilla and ribs.  She does feel somewhat short of breath and it hurts to take a deep breath.  She is also had some rash under her left breast has been going on for a few weeks and they have been prescribing her different creams for it.  No cough or fever.  She has a history of cardiac disease with stents and her discomfort feels somewhat like that.  She has not tried anything for it.  The history is provided by the patient.  Chest Pain Pain location:  Substernal area Pain quality: pressure   Pain radiates to:  L shoulder Pain severity:  Moderate Onset quality:  Gradual Duration:  2 days Timing:  Intermittent Progression:  Worsening Chronicity:  New Relieved by:  None tried Worsened by:  Deep breathing Ineffective treatments:  None tried Associated symptoms: nausea and shortness of breath   Associated symptoms: no abdominal pain, no cough, no diaphoresis, no fever, no headache and no vomiting   Shortness of Breath Associated symptoms: chest pain and rash   Associated symptoms: no abdominal pain, no cough, no diaphoresis, no fever, no headaches, no neck pain, no sore throat and no vomiting     HPI: A 70 year old patient with a history of treated diabetes, hypertension and hypercholesterolemia presents for evaluation of chest pain. Initial onset of pain was more than 6 hours ago. The patient's chest pain is described as heaviness/pressure/tightness, is not worse with exertion and is relieved by nitroglycerin. The patient complains of nausea. The patient's chest  pain is middle- or left-sided, is not well-localized, is not sharp and does radiate to the arms/jaw/neck. The patient denies diaphoresis. The patient has no history of stroke, has no history of peripheral artery disease, has not smoked in the past 90 days, has no relevant family history of coronary artery disease (first degree relative at less than age 40) and does not have an elevated BMI (>=30).   Past Medical History:  Diagnosis Date  . Anemia   . Anxiety   . Breast cancer ([place]) 09/2019   left breast IMC  . CAD (coronary artery disease)    a. 3/2007s/p DES to the LAD [facility]); b. 01/2017 MV: EF 80%, small, mild apical ant defect w/o ischemia (felt to be breast atten). Low risk.  . Depression   . DM (diabetes mellitus) ([place])   . Essential hypertension   . GERD (gastroesophageal reflux disease)   . Headache    secondary to neck surgery per patient  . High triglycerides   . Hyperlipidemia   . Hypothyroid   . OA (osteoarthritis)    right knee,hands  . Overflow incontinence   . PONV (postoperative nausea and vomiting)    s/p gallbladder   . RLS (restless legs syndrome)   . Urinary urgency   . Uterine cancer [facility]) dx'd 2014    Patient Active Problem List   Diagnosis Date Noted  . Port-A-Cath in place 11/24/2019  . Hepatic steatosis 09/28/2019  . Malignant neoplasm of upper-outer quadrant of left  breast in female, estrogen receptor positive ([place]) 09/02/2019  . Endometrial cancer ([place]) 10/23/2018  . Cirrhosis of liver without ascites ([place]) 10/23/2018  . Diarrhea 10/23/2018  . Chest pain 02/15/2018  . Coronary artery disease involving native coronary artery of native heart without angina pectoris 11/18/2017  . Hyperlipidemia LDL goal <70 11/18/2017  . Type 2 diabetes mellitus without complication, without long-term current use of insulin ([place]) 11/18/2017  . Dizziness 11/18/2017    Past Surgical History:  Procedure Laterality Date  . BREAST LUMPECTOMY WITH RADIOACTIVE  SEED AND SENTINEL LYMPH NODE BIOPSY Left 10/07/2019   Procedure: LEFT BREAST LUMPECTOMY WITH RADIOACTIVE SEED AND SENTINEL LYMPH NODE BIOPSY;  Surgeon: Rolm Bookbinder, MD;  Location: [place];  Service: General;  Laterality: Left;  . CORONARY STENT PLACEMENT    . CORONARY/GRAFT ACUTE MI REVASCULARIZATION    . FINGER ARTHRODESIS Right 06/11/2019   Procedure: ARTHRODESIS INDEX FINGER DISTAL PHALANGEAL JOINT;  Surgeon: [name], MD;  Location: [place];  Service: Orthopedics;  Laterality: Right;  . FOOT SURGERY Left   . LEFT HEART CATH AND CORONARY ANGIOGRAPHY N/A 02/16/2018   Procedure: LEFT HEART CATH AND CORONARY ANGIOGRAPHY;  Surgeon: Lorretta Harp, MD;  Location: [place] CV LAB;  Service: Cardiovascular;  Laterality: N/A;  . PORTACATH PLACEMENT Right 11/23/2019   Procedure: INSERTION PORT-A-CATH WITH ULTRASOUND GUIDANCE;  Surgeon: Rolm Bookbinder, MD;  Location: [place];  Service: General;  Laterality: Right;  . REPLACEMENT TOTAL KNEE Right   . SPINE SURGERY    . TOTAL ABDOMINAL HYSTERECTOMY     FOR UTERUS CANCER  . TOTAL HIP ARTHROPLASTY Left      OB History   No obstetric history on file.     Family History  Problem Relation Age of Onset  . AAA (abdominal aortic aneurysm) Mother   . Diabetes Father   . Alzheimer's disease Father   . Throat cancer Sister   . Breast cancer Cousin     Social History   Tobacco Use  . Smoking status: Never Smoker  . Smokeless tobacco: Never Used  Vaping Use  . Vaping Use: Never used  Substance Use Topics  . Alcohol use: Yes    Comment: OCCASIONALLY  . Drug use: No    Home Medications Prior to Admission medications   Medication Sig Start Date End Date Taking? Authorizing Provider  Ascorbic Acid (VITAMIN C PO) Take 1 tablet by mouth daily.   Yes [provider]  aspirin EC 81 MG tablet Take 81 mg by mouth daily.   Yes [provider]  atorvastatin  (LIPITOR) 40 MG tablet TAKE 1 TABLET BY MOUTH EVERY DAY 04/03/20  Yes Nahser, [name], MD  bisoprolol (ZEBETA) 5 MG tablet TAKE 1 TABLET BY MOUTH EVERY DAY 11/03/19  Yes Nahser, [name], MD  Calcium Citrate-Vitamin D (CALCIUM + D PO) Take 1 tablet by mouth daily with supper.   Yes [provider]  CINNAMON PO Take 1 capsule by mouth daily with supper.   Yes [provider]  diclofenac sodium (VOLTAREN) 1 % GEL Apply 2-4 g topically 4 (four) times daily as needed (as directed for pain).   Yes [provider]  Dulaglutide (TRULICITY) 1.5 YQ/6.5HQ SOPN Inject 1.5 mg into the skin once a week. 12/01/19  Yes Magrinat, Virgie Dad, MD  escitalopram (LEXAPRO) 10 MG tablet Take 10 mg by mouth at bedtime.   Yes [provider]  Ferrous Sulfate (IRON PO) Take 1  tablet by mouth daily. alternates days:1 tablet one day and 2 tablets the next day   Yes [provider]  gabapentin (NEURONTIN) 300 MG capsule Take 1 capsule (300 mg total) by mouth at bedtime. 03/08/20  Yes Magrinat, Virgie Dad, MD  GLIPIZIDE XL 10 MG 24 hr tablet Take 20 mg by mouth daily.  10/08/17  Yes [provider]  JARDIANCE 25 MG TABS tablet Take 25 mg by mouth every morning.  10/19/17  Yes [provider]  levothyroxine (SYNTHROID, LEVOTHROID) 125 MCG tablet Take 125 mcg by mouth daily before breakfast.   Yes [provider]  metFORMIN (GLUCOPHAGE) 500 MG tablet Take 500 mg by mouth 2 (two) times daily with a meal.   Yes [provider]  metroNIDAZOLE (METROGEL) 1 % gel Apply 1 application topically as needed (as directed to affected area). 04/20/20  Yes Causey, Charlestine Massed, NP  Multiple Vitamins-Calcium (ONE-A-DAY WOMENS PO) Take 1 tablet by mouth daily.   Yes [provider]  nitroGLYCERIN (NITROSTAT) 0.4 MG SL tablet Place 1 tablet (0.4 mg total) under the tongue every 5 (five) minutes as needed for chest pain. 09/16/18  Yes Nahser, [name], MD  omeprazole  (PRILOSEC) 40 MG capsule Take 40 mg by mouth daily with supper.    Yes [provider]  prochlorperazine (COMPAZINE) 10 MG tablet Take 1 tablet by mouth before meals and at bedtime on days 2 and 3 after chemotherapy (counting chemotherapy day as day 1); after that may take as needed 02/16/20  Yes Alla Feeling, NP  rOPINIRole (REQUIP) 0.5 MG tablet Take 0.5 mg by mouth at bedtime. 1-3 hours prior to bedtime   Yes [provider]  tiZANidine (ZANAFLEX) 4 MG tablet Take 4 mg by mouth every 8 (eight) hours as needed for muscle spasms. As needed 05/06/19  Yes [provider]  traMADol (ULTRAM) 50 MG tablet Take 50 mg by mouth every 6 (six) hours as needed for moderate pain. 04/19/20  Yes [provider]  TURMERIC PO Take 1 capsule by mouth daily with supper.   Yes [provider]  ACCU-CHEK GUIDE test strip 1 each by Other route as needed. 04/19/19   [provider]    Allergies    Chloraprep one step [chlorhexidine gluconate], Estrogens, and Shrimp [shellfish allergy]  Review of Systems   Review of Systems  Constitutional: Negative for diaphoresis and fever.  HENT: Negative for sore throat.   Eyes: Negative for visual disturbance.  Respiratory: Positive for shortness of breath. Negative for cough.   Cardiovascular: Positive for chest pain.  Gastrointestinal: Positive for nausea. Negative for abdominal pain and vomiting.  Genitourinary: Negative for dysuria.  Musculoskeletal: Negative for neck pain.  Skin: Positive for rash.  Neurological: Negative for headaches.    Physical Exam Updated Vital Signs BP 119/70 (BP Location: Left Arm)   Pulse 87   Temp 97.8 F (36.6 C)   Resp 16   Ht 5\' 4"  (1.626 m)   Wt 77.4 kg   SpO2 93%   BMI 29.28 kg/m   Physical Exam Vitals and nursing note reviewed.  Constitutional:      General: She is not in acute distress.    Appearance: She is well-developed.  HENT:     Head: Normocephalic and  atraumatic.  Eyes:     Conjunctiva/sclera: Conjunctivae normal.  Cardiovascular:     Rate and Rhythm: Normal rate and regular rhythm.     Heart sounds: Normal heart sounds. No  murmur heard.   Pulmonary:     Effort: Pulmonary effort is normal. No respiratory distress.     Breath sounds: Normal breath sounds.  Abdominal:     Palpations: Abdomen is soft.     Tenderness: There is no abdominal tenderness.  Musculoskeletal:        General: Normal range of motion.     Cervical back: Neck supple.     Right lower leg: No tenderness. No edema.     Left lower leg: No tenderness. No edema.  Skin:    General: Skin is warm and dry.  Neurological:     General: No focal deficit present.     Mental Status: She is alert.     ED Results / Procedures / Treatments   Labs (all labs ordered are listed, but only abnormal results are displayed) Labs Reviewed  BASIC METABOLIC PANEL - Abnormal; Notable for the following components:      Result Value   Glucose, Bld 158 (*)    All other components within normal limits  BASIC METABOLIC PANEL - Abnormal; Notable for the following components:   Potassium 3.1 (*)    All other components within normal limits  CBC - Abnormal; Notable for the following components:   Platelets 148 (*)    All other components within normal limits  GLUCOSE, CAPILLARY - Abnormal; Notable for the following components:   Glucose-Capillary 132 (*)    All other components within normal limits  RESP PANEL BY RT-PCR (FLU A&B, COVID) ARPGX2  CBC  HEMOGLOBIN A1C  HIV ANTIBODY (ROUTINE TESTING W REFLEX)  TROPONIN I (HIGH SENSITIVITY)  TROPONIN I (HIGH SENSITIVITY)    EKG EKG Interpretation  Date/Time:  Saturday April 29 2020 15:16:23 EDT Ventricular Rate:  85 PR Interval:  165 QRS Duration: 84 QT Interval:  384 QTC Calculation: 457 R Axis:   -34 Text Interpretation: Sinus rhythm Left axis deviation Abnormal R-wave progression, late transition Borderline T wave  abnormalities 12 Lead; Mason-Likar nonspecific t flattening compared with prior 2/20 Confirmed by Aletta Edouard [phone]) on 04/29/2020 4:44:50 PM   Radiology DG Chest Portable 1 View  Result Date: 04/29/2020 CLINICAL DATA:  Chest pain. EXAM: PORTABLE CHEST 1 VIEW COMPARISON:  February 14, 2018 FINDINGS: The heart size and mediastinal contours are within normal limits. Right central venous line is identified with distal tip in the superior vena cava. Both lungs are clear. The visualized skeletal structures are unremarkable. IMPRESSION: No active disease. Electronically Signed   By: Abelardo Diesel M.D.   On: 04/29/2020 15:51    Procedures Procedures   Medications Ordered in ED Medications  nitroGLYCERIN (NITROSTAT) SL tablet 0.4 mg (0.4 mg Sublingual Given 04/29/20 1635)  acetaminophen (TYLENOL) tablet 650 mg (650 mg Oral Given 04/29/20 2136)  nystatin (MYCOSTATIN/NYSTOP) topical powder ( Topical Given 04/30/20 0912)  traMADol (ULTRAM) tablet 50 mg (50 mg Oral Given 04/29/20 2225)  oxyCODONE (Oxy IR/ROXICODONE) immediate release tablet 5 mg (5 mg Oral Given 04/29/20 2136)  ondansetron (ZOFRAN) injection 4 mg (has no administration in time range)  aspirin EC tablet 81 mg (81 mg Oral Given 04/30/20 0910)  atorvastatin (LIPITOR) tablet 40 mg (has no administration in time range)  bisoprolol (ZEBETA) tablet 5 mg (5 mg Oral Given 04/29/20 2224)  escitalopram (LEXAPRO) tablet 10 mg (10 mg Oral Given 04/29/20 2225)  levothyroxine (SYNTHROID) tablet 125 mcg (125 mcg Oral Given 04/30/20 0517)  pantoprazole (PROTONIX) EC tablet 40 mg (40 mg Oral Given 04/30/20 0910)  gabapentin (NEURONTIN) capsule  300 mg (300 mg Oral Given 04/29/20 2225)  rOPINIRole (REQUIP) tablet 0.5 mg (0.5 mg Oral Given 04/29/20 2224)  tiZANidine (ZANAFLEX) tablet 4 mg (4 mg Oral Given 04/30/20 0525)  insulin aspart (novoLOG) injection 0-9 Units (1 Units Subcutaneous Given 04/30/20 0912)  enoxaparin (LOVENOX) injection 40 mg (40 mg  Subcutaneous Not Given 04/29/20 2218)  0.9 %  sodium chloride infusion (has no administration in time range)  polyethylene glycol (MIRALAX / GLYCOLAX) packet 17 g (has no administration in time range)  aspirin chewable tablet 324 mg (324 mg Oral Given 04/29/20 1635)  iohexol (OMNIPAQUE) 350 MG/ML injection 80 mL (80 mLs Intravenous Contrast Given 04/29/20 1822)    ED Course  I have reviewed the triage vital signs and the nursing notes.  Pertinent labs & imaging results that were available during my care of the patient were reviewed by me and considered in my medical decision making (see chart for details).  Clinical Course as of 04/30/20 0947  Sat Apr 29, 2020  1806 Patient had a nitro with some improvement in her symptoms.  Due to her high risk with malignancy for PE have ordered a CT angio of the chest. [MB]  1920 Discussed with Dr. Myna Hidalgo [facility] hospitalist who will evaluate the patient for admission. [MB]    Clinical Course User Index [MB] Hayden Rasmussen, MD   MDM Rules/Calculators/A&P HEAR Score: 7                       This patient complains of chest discomfort; this involves an extensive number of treatment Options and is a complaint that carries with it a high risk of complications and Morbidity. The differential includes ACS, unstable angina, musculoskeletal,, radiation-induced, GERD, pneumonia, pneumothorax, vascular, PE  I ordered, reviewed and interpreted labs, which included CBC with normal white count normal hemoglobin, chemistries normal other than mildly elevated glucose, troponins flat, COVID testing negative I ordered medication oral nitro with improvement in her symptoms I ordered imaging studies which included chest x-ray and CT angio chest and I independently    visualized and interpreted imaging which showed no acute findings Previous records obtained and reviewed in epic including prior cardiology notes I consulted Dr. Myna Hidalgo [facility] hospitalist and discussed lab and  imaging findings  Critical Interventions: None  After the interventions stated above, I reevaluated the patient and found patient to be pain-free after nitro.  With her high hear score I recommended that she be admitted to observation in the hospital for further work-up.  Patient is agreeable to plan.   Final Clinical Impression(s) / ED Diagnoses Final diagnoses:  Unstable angina [facility])    Rx / DC Orders ED Discharge Orders    None       Hayden Rasmussen, MD 04/30/20 [phone]

## 2020-04-30 ENCOUNTER — Ambulatory Visit (HOSPITAL_COMMUNITY)
Admit: 2020-04-30 | Discharge: 2020-04-30 | Disposition: A | Discharge status: Home / Self Care | Payer: Medicare Other | Attending: Cardiology | Admitting: Cardiology

## 2020-04-30 ENCOUNTER — Observation Stay (HOSPITAL_COMMUNITY): Payer: Medicare Other

## 2020-04-30 DIAGNOSIS — F32A Depression, unspecified: Secondary | ICD-10-CM | POA: Diagnosis not present

## 2020-04-30 DIAGNOSIS — L8990 Pressure ulcer of unspecified site, unspecified stage: Secondary | ICD-10-CM | POA: Insufficient documentation

## 2020-04-30 DIAGNOSIS — R079 Chest pain, unspecified: Secondary | ICD-10-CM | POA: Diagnosis not present

## 2020-04-30 DIAGNOSIS — K148 Other diseases of tongue: Secondary | ICD-10-CM | POA: Insufficient documentation

## 2020-04-30 DIAGNOSIS — I2510 Atherosclerotic heart disease of native coronary artery without angina pectoris: Secondary | ICD-10-CM | POA: Diagnosis not present

## 2020-04-30 LAB — NM MYOCAR MULTI W/SPECT W/WALL MOTION / EF
Peak HR: 103 {beats}/min
Rest HR: 82 {beats}/min

## 2020-04-30 LAB — BASIC METABOLIC PANEL
Anion gap: 11 (ref 5–15)
BUN: 11 mg/dL (ref 8–23)
CO2: 26 mmol/L (ref 22–32)
Calcium: 9.8 mg/dL (ref 8.9–10.3)
Chloride: 103 mmol/L (ref 98–111)
Creatinine, Ser: 0.5 mg/dL (ref 0.44–1.00)
GFR, Estimated: >60 mL/min (ref >60)
Glucose, Bld: 93 mg/dL (ref 70–99)
Potassium: 3.1 mmol/L — ABNORMAL LOW (ref 3.5–5.1)
Sodium: 140 mmol/L (ref 135–145)

## 2020-04-30 LAB — HIV ANTIBODY (ROUTINE TESTING W REFLEX): HIV Screen 4th Generation wRfx: NONREACTIVE

## 2020-04-30 LAB — CBC
HCT: 43.8 % (ref 36.0–46.0)
Hemoglobin: 14.1 g/dL (ref 12.0–15.0)
MCH: 32 pg (ref 26.0–34.0)
MCHC: 32.2 g/dL (ref 30.0–36.0)
MCV: 99.5 fL (ref 80.0–100.0)
Platelets: 148 10*3/uL — ABNORMAL LOW (ref 150–400)
RBC: 4.4 MIL/uL (ref 3.87–5.11)
RDW: 14.3 % (ref 11.5–15.5)
WBC: 5.9 10*3/uL (ref 4.0–10.5)
nRBC: 0 % (ref 0.0–0.2)

## 2020-04-30 LAB — GLUCOSE, CAPILLARY
Glucose-Capillary: 132 mg/dL — ABNORMAL HIGH (ref 70–99)
Glucose-Capillary: 114 mg/dL — ABNORMAL HIGH (ref 70–99)
Glucose-Capillary: 207 mg/dL — ABNORMAL HIGH (ref 70–99)

## 2020-04-30 MED ORDER — TECHNETIUM TC 99M TETROFOSMIN IV KIT
10.0000 | PACK | Freq: Once | INTRAVENOUS | Status: AC | PRN
  Administered 2020-04-30: 10 via INTRAVENOUS

## 2020-04-30 MED ORDER — TECHNETIUM TC 99M TETROFOSMIN IV KIT
32.0000 | PACK | Freq: Once | INTRAVENOUS | Status: AC | PRN
  Administered 2020-04-30: 32 via INTRAVENOUS

## 2020-04-30 MED ORDER — REGADENOSON 0.4 MG/5ML IV SOLN
0.4000 mg | Freq: Once | INTRAVENOUS | Status: AC
  Administered 2020-04-30: 0.4 mg via INTRAVENOUS

## 2020-04-30 MED ORDER — AMINOPHYLLINE 25 MG/ML IV (NUC MED)
75.0000 mg | Freq: Once | INTRAVENOUS | Status: AC
  Administered 2020-04-30: 75 mg via INTRAVENOUS

## 2020-04-30 MED ORDER — REGADENOSON 0.4 MG/5ML IV SOLN
INTRAVENOUS | Status: AC
  Filled 2020-04-30: qty 5

## 2020-04-30 MED ORDER — POTASSIUM CHLORIDE CRYS ER 20 MEQ PO TBCR
40.0000 meq | EXTENDED_RELEASE_TABLET | Freq: Once | ORAL | Status: AC
  Administered 2020-04-30: 40 meq via ORAL
  Filled 2020-04-30: qty 2

## 2020-04-30 MED ORDER — AMINOPHYLLINE 25 MG/ML IV SOLN
INTRAVENOUS | Status: AC
  Filled 2020-04-30: qty 10

## 2020-04-30 NOTE — Progress Notes (Signed)
Per pt radiation scheduled for tmrw around 11 am. Pt made aware she may need to reschedule; but the radiation she gets is here through cancer center.  Left collar bone area red/irritated from radiation (new since admission).

## 2020-04-30 NOTE — Progress Notes (Addendum)
Received notification from IM that they had gotten call from radiology that imaging showed "small reversible area at the inferior apex" but that formal read won't be in until tonight (new day provider doesn't have access to the system yet to put in notes). Given symptoms during [place], presentation/hx and abnormal preliminary nuc report, we will keep her overnight for observation, make NPO after midnight and have our team evaluate in AM to review the full report when it's available and decide on cath. I sent message to early APP to relay to team. Spoke with pt who is agreeable. She is not having any current CP and feels OK. Also noted K 3.1, not yet supplemented, will order 25meq po x 1 now. Also added TSH/Mg to AM labs. OK to eat today, will just keep NPO after midnight Felicia Simonet PA-C

## 2020-04-30 NOTE — Progress Notes (Signed)
PROGRESS NOTE    Felicia [name]  IOX:[id number] DOB: [birth year] DOA: 04/29/2020 PCP: Kathyrn Lass, MD   Brief Narrative:  Felicia [name] is a 69 y.o. female with medical history significant for cancer of the left breast undergoing chemotherapy and radiation, hypothyroidism, type 2 diabetes mellitus, osteoarthritis, and coronary artery disease with LAD stent, presenting to the emergency department for evaluation of chest pain.  Patient reports some chest discomfort that was intermittent yesterday, associated with shortness of breath, onset at rest, and without any alleviating or exacerbating factors identified.  Discomfort became more intense today, she had difficulty taking a deep breath, reports radiation from the left chest to her left shoulder and upper arm, and describes symptoms as similar to what she experienced prior to her LAD stent that there was no radiation at that time.  She denies cough, leg swelling, fevers, chills, or calf tenderness.  She has a small wound and maceration under the left breast for the past 2 weeks but does not believe that is related.In ED: EKG features sinus rhythm.  Chest x-ray negative for acute cardiopulmonary disease.  CTA chest negative for PE though assessment of lower lobe branches is limited.  Troponin negative, hospitalist called for admission.   Assessment & Plan:   Principal Problem:   Chest pain Active Problems:   Coronary artery disease involving native coronary artery of native heart without angina pectoris   Type 2 diabetes mellitus without complication, without long-term current use of insulin (HCC)   Malignant neoplasm of upper-outer quadrant of left breast in female, estrogen receptor positive (HCC)   Hypothyroid   Anxiety   Depression   Pressure injury of skin  Atypical chest pain; rule out ACS History of known CAD s/p LAD stent 2019  - Patient with hx of CAD presenting with 2 days of progressive chest pain, relieved with NTG in ED  - No  acute ischemic features noted on EKG, initial troponin wnl, and no acute findings on CXR  - LHC 2 yrs ago with widely patent LAD stent and no other CAD  - Stress test preliminary read for reversible inferior/anterior apex ischemia; cards to follow for possible cath in the next 24h   Breast cancer  - Left breast cancer s/p lumpectomy undergoing adjuvant chemotherapy and radiation  - Oncologist added to treatment team as notification of patient's admission   Type II DM  - No results found for: HGBA1C - Glucose moderately well controlled on minimum sliding scale   Hypothyroidism  - Continue Synthroid   Depression, anxiety  - Continue Lexapro    Chronic pain; RLS  - Continue gabapentin, as-needed Zanaflex and tramadol, Requip    DVT prophylaxis: Lovenox  Code Status: Full  Patient not medically stable for discharge  Consultants:   Cards  Procedures:   Stress test 4/17  Antimicrobials:  None   Subjective: No acute issues/events overnight  Objective: Vitals:   04/29/20 2122 04/30/20 0508 04/30/20 0902 04/30/20 1512  BP: 135/90 129/76 120/71 125/85  Pulse: 77 81 76 75  Resp:  19 11 20   Temp: 97.8 F (36.6 C) 98.3 F (36.8 C) 97.7 F (36.5 C) 98.2 F (36.8 C)  TempSrc: Oral Oral Oral Oral  SpO2: 97% (!) 89% 95% 93%  Weight:      Height:       No intake or output data in the 24 hours ending 04/30/20 1518 Filed Weights   04/29/20 1517  Weight: 77.4 kg    Examination:  General exam: Appears  calm and comfortable  Respiratory system: Clear to auscultation. Respiratory effort normal. Cardiovascular system: S1 & S2 heard, RRR. No JVD, murmurs, rubs, gallops or clicks. No pedal edema. Gastrointestinal system: Abdomen is nondistended, soft and nontender. No organomegaly or masses felt. Normal bowel sounds heard. Central nervous system: Alert and oriented. No focal neurological deficits. Extremities: Symmetric 5 x 5 power. Skin: No rashes, lesions or  ulcers Psychiatry: Judgement and insight appear normal. Mood & affect appropriate.   Data Reviewed: I have personally reviewed following labs and imaging studies  CBC: Recent Labs  Lab 04/29/20 1632 04/30/20 0351  WBC 6.7 5.9  HGB 14.8 14.1  HCT 44.9 43.8  MCV 99.3 99.5  PLT 159 124*   Basic Metabolic Panel: Recent Labs  Lab 04/29/20 1632 04/30/20 0351  NA 137 140  K 4.3 3.1*  CL 102 103  CO2 24 26  GLUCOSE 158* 93  BUN 10 11  CREATININE 0.60 0.50  CALCIUM 10.2 9.8   GFR: Estimated Creatinine Clearance: 65.9 mL/min (by C-G formula based on SCr of 0.5 mg/dL). Liver Function Tests: No results for input(s): AST, ALT, ALKPHOS, BILITOT, PROT, ALBUMIN in the last 168 hours. No results for input(s): LIPASE, AMYLASE in the last 168 hours. No results for input(s): AMMONIA in the last 168 hours. Coagulation Profile: No results for input(s): INR, PROTIME in the last 168 hours. Cardiac Enzymes: No results for input(s): CKTOTAL, CKMB, CKMBINDEX, TROPONINI in the last 168 hours. BNP (last 3 results) No results for input(s): PROBNP in the last 8760 hours. HbA1C: No results for input(s): HGBA1C in the last 72 hours. CBG: Recent Labs  Lab 04/30/20 0746  GLUCAP 132*   Lipid Profile: No results for input(s): CHOL, HDL, LDLCALC, TRIG, CHOLHDL, LDLDIRECT in the last 72 hours. Thyroid Function Tests: No results for input(s): TSH, T4TOTAL, FREET4, T3FREE, THYROIDAB in the last 72 hours. Anemia Panel: No results for input(s): VITAMINB12, FOLATE, FERRITIN, TIBC, IRON, RETICCTPCT in the last 72 hours. Sepsis Labs: No results for input(s): PROCALCITON, LATICACIDVEN in the last 168 hours.  Recent Results (from the past 240 hour(s))  Resp Panel by RT-PCR (Flu A&B, Covid) Nasopharyngeal Swab     Status: None   Collection Time: 04/29/20  7:17 PM   Specimen: Nasopharyngeal Swab; Nasopharyngeal(NP) swabs in vial transport medium  Result Value Ref Range Status   SARS Coronavirus 2 by RT  PCR NEGATIVE NEGATIVE Final    Comment: (NOTE) SARS-CoV-2 target nucleic acids are NOT DETECTED.  The SARS-CoV-2 RNA is generally detectable in upper respiratory specimens during the acute phase of infection. The lowest concentration of SARS-CoV-2 viral copies this assay can detect is 138 copies/mL. A negative result does not preclude SARS-Cov-2 infection and should not be used as the sole basis for treatment or other patient management decisions. A negative result may occur with  improper specimen collection/handling, submission of specimen other than nasopharyngeal swab, presence of viral mutation(s) within the areas targeted by this assay, and inadequate number of viral copies(<138 copies/mL). A negative result must be combined with clinical observations, patient history, and epidemiological information. The expected result is Negative.  Fact Sheet for Patients:  EntrepreneurPulse.com.au  Fact Sheet for Healthcare Providers:  IncredibleEmployment.be  This test is no t yet approved or cleared by the [place] FDA and  has been authorized for detection and/or diagnosis of SARS-CoV-2 by FDA under an Emergency Use Authorization (EUA). This EUA will remain  in effect (meaning this test can be used) for the duration of  the COVID-19 declaration under Section 564(b)(1) of the Act, 21 U.S.C.section 360bbb-3(b)(1), unless the authorization is terminated  or revoked sooner.       Influenza A by PCR NEGATIVE NEGATIVE Final   Influenza B by PCR NEGATIVE NEGATIVE Final    Comment: (NOTE) The Xpert Xpress SARS-CoV-2/FLU/RSV plus assay is intended as an aid in the diagnosis of influenza from Nasopharyngeal swab specimens and should not be used as a sole basis for treatment. Nasal washings and aspirates are unacceptable for Xpert Xpress SARS-CoV-2/FLU/RSV testing.  Fact Sheet for Patients: EntrepreneurPulse.com.au  Fact Sheet for  Healthcare Providers: IncredibleEmployment.be  This test is not yet approved or cleared by the [place] FDA and has been authorized for detection and/or diagnosis of SARS-CoV-2 by FDA under an Emergency Use Authorization (EUA). This EUA will remain in effect (meaning this test can be used) for the duration of the COVID-19 declaration under Section 564(b)(1) of the Act, 21 U.S.C. section 360bbb-3(b)(1), unless the authorization is terminated or revoked.  Performed at [facility], [place] [address] [place], [place] [address]          Radiology Studies: CT Angio Chest PE W/Cm &/Or Wo Cm  Result Date: 04/29/2020 CLINICAL DATA:  Left-sided chest pain EXAM: CT ANGIOGRAPHY CHEST WITH CONTRAST TECHNIQUE: Multidetector CT imaging of the chest was performed using the standard protocol during bolus administration of intravenous contrast. Multiplanar CT image reconstructions and MIPs were obtained to evaluate the vascular anatomy. CONTRAST:  80mL OMNIPAQUE IOHEXOL 350 MG/ML SOLN COMPARISON:  Chest x-ray from earlier in the same day. FINDINGS: Cardiovascular: Atherosclerotic calcifications of the thoracic aorta are noted. Mild dilatation of the ascending aorta is seen to 3.7 cm. Normal tapering in the aortic arch is noted. No dissection is seen. Heart is at the upper limits of normal in size. Heavy coronary calcifications are noted. The pulmonary artery shows a normal branching pattern without definitive intraluminal filling defect to suggest pulmonary embolism. Peripheral branches in the lower lobes are somewhat limited due to motion artifact. Mediastinum/Nodes: Thoracic inlet is within normal limits. Right chest wall port is seen in satisfactory position. No sizable hilar or mediastinal adenopathy is noted. The esophagus as visualized is within normal limits. Lungs/Pleura: Lungs are well aerated bilaterally with mild dependent atelectatic changes as well as  diffuse emphysematous changes. No focal infiltrate or sizable effusion is seen. No sizable parenchymal nodules are noted. Upper Abdomen: Changes of prior cholecystectomy are seen. No acute upper abdominal abnormality is noted. Musculoskeletal: Degenerative changes of the thoracic spine are noted. Postsurgical changes in the cervical spine are seen as well. Review of the MIP images confirms the above findings. IMPRESSION: No definitive pulmonary emboli although the lower lobe branches are somewhat limited due to timing of the contrast bolus and mild motion artifact. Dilatation of the ascending aorta 3.7 cm. No specific follow-up is recommended at this time. Bibasilar atelectasis. Aortic Atherosclerosis (ICD10-I70.0) and Emphysema (ICD10-J43.9). Electronically Signed   By: Inez Catalina M.D.   On: 04/29/2020 18:50   DG Chest Portable 1 View  Result Date: 04/29/2020 CLINICAL DATA:  Chest pain. EXAM: PORTABLE CHEST 1 VIEW COMPARISON:  February 14, 2018 FINDINGS: The heart size and mediastinal contours are within normal limits. Right central venous line is identified with distal tip in the superior vena cava. Both lungs are clear. The visualized skeletal structures are unremarkable. IMPRESSION: No active disease. Electronically Signed   By: Abelardo Diesel M.D.   On: 04/29/2020 15:51   Scheduled Meds: .  aspirin EC  81 mg Oral Daily  . atorvastatin  40 mg Oral Daily  . bisoprolol  5 mg Oral QHS  . enoxaparin (LOVENOX) injection  40 mg Subcutaneous Q24H  . escitalopram  10 mg Oral QHS  . gabapentin  300 mg Oral QHS  . insulin aspart  0-9 Units Subcutaneous TID WC  . levothyroxine  125 mcg Oral QAC breakfast  . nystatin   Topical TID  . pantoprazole  40 mg Oral Daily  . rOPINIRole  0.5 mg Oral QHS   Continuous Infusions: . sodium chloride       LOS: 0 days   Time spent: 32min  Felicia [name] C Ameenah Prosser, DO [facility]  If 7PM-7AM, please contact night-coverage [email or web address]  04/30/2020, 3:18 PM

## 2020-04-30 NOTE — Progress Notes (Signed)
   Felicia [name] presented for a nuclear stress test today.  No immediate complications. She did experience chest discomfort and dyspnea so aminophylline 75mg  administered at 6 minute mark with improvement in symptoms. Stress imaging is pending at this time.  Preliminary EKG findings may be listed in the chart, but the stress test result will not be finalized until perfusion imaging is complete.  Charlie Pitter, PA-C 04/30/2020, 1:14 PM

## 2020-04-30 NOTE — H&P (View-Only) (Signed)
Cardiology Consultation:   Patient ID: Felicia [name] MRN: [id number]; DOB: May 14, 1950  Admit date: 04/29/2020 Date of Consult: 04/30/2020  PCP:  Kathyrn Lass, MD   [facility]  Cardiologist:  Mertie Moores, MD  Advanced Practice Provider:  No care team member to display Electrophysiologist:  None  }    Patient Profile:   Felicia [name] is a 70 y.o. female with a hx of CAD with prior stent to LAD, DM2, HL  who is being seen today for the evaluation of chest pain at the request of Dr Avon Gully.  History of Present Illness:   Felicia [name] 70 yo female history of CAD with mid LAD stent in 2017, HL, DM2, breast cancer on chemo/radiation admitted with chest pain.  Reports episode yesterday of 5/10 chest pressure midchest radiating to left, felt clammy. Symptoms lasted a few hours at home before coming to ER. In ER received NG with complete resolution of chest pain. She reports symptoms somewhat similar to what she had in 2017 prior to her stent. Has had mild symptoms over the last few weeks, this was most intense  K 4.3 Cr 0.60 BUN 10 WBC 6.7 Hgb 14.8 Plt 159  Trop 5-->2 COVID neg CXR no acute process CT PE no PE though limited visualization EKG SR, nonspecific ST/T changes  Jan 2022 echo LVEF 60-65%, no WMAs, normal RV function 02/2018 cath: patent vessels and LAD stent Jan 2019 nuclear stress: no ischemia    Past Medical History:  Diagnosis Date  . Anemia   . Anxiety   . Breast cancer ([place]) 09/2019   left breast IMC  . CAD (coronary artery disease)    a. 3/2007s/p DES to the LAD [facility]); b. 01/2017 MV: EF 80%, small, mild apical ant defect w/o ischemia (felt to be breast atten). Low risk.  . Depression   . DM (diabetes mellitus) ([place])   . Essential hypertension   . GERD (gastroesophageal reflux disease)   . Headache    secondary to neck surgery per patient  . High triglycerides   . Hyperlipidemia   . Hypothyroid   . OA (osteoarthritis)     right knee,hands  . Overflow incontinence   . PONV (postoperative nausea and vomiting)    s/p gallbladder   . RLS (restless legs syndrome)   . Urinary urgency   . Uterine cancer ([place]) dx'd 2014    Past Surgical History:  Procedure Laterality Date  . BREAST LUMPECTOMY WITH RADIOACTIVE SEED AND SENTINEL LYMPH NODE BIOPSY Left 10/07/2019   Procedure: LEFT BREAST LUMPECTOMY WITH RADIOACTIVE SEED AND SENTINEL LYMPH NODE BIOPSY;  Surgeon: Rolm Bookbinder, MD;  Location: [place];  Service: General;  Laterality: Left;  . CORONARY STENT PLACEMENT    . CORONARY/GRAFT ACUTE MI REVASCULARIZATION    . FINGER ARTHRODESIS Right 06/11/2019   Procedure: ARTHRODESIS INDEX FINGER DISTAL PHALANGEAL JOINT;  Surgeon: [name], MD;  Location: [place];  Service: Orthopedics;  Laterality: Right;  . FOOT SURGERY Left   . LEFT HEART CATH AND CORONARY ANGIOGRAPHY N/A 02/16/2018   Procedure: LEFT HEART CATH AND CORONARY ANGIOGRAPHY;  Surgeon: Lorretta Harp, MD;  Location: [place] [facility];  Service: Cardiovascular;  Laterality: N/A;  . PORTACATH PLACEMENT Right 11/23/2019   Procedure: INSERTION PORT-A-CATH WITH ULTRASOUND GUIDANCE;  Surgeon: Rolm Bookbinder, MD;  Location: [place];  Service: General;  Laterality: Right;  . REPLACEMENT TOTAL KNEE Right   . SPINE SURGERY    .  TOTAL ABDOMINAL HYSTERECTOMY     FOR UTERUS CANCER  . TOTAL HIP ARTHROPLASTY Left      Inpatient Medications: Scheduled Meds: . aspirin EC  81 mg Oral Daily  . atorvastatin  40 mg Oral Daily  . bisoprolol  5 mg Oral QHS  . enoxaparin (LOVENOX) injection  40 mg Subcutaneous Q24H  . escitalopram  10 mg Oral QHS  . gabapentin  300 mg Oral QHS  . insulin aspart  0-9 Units Subcutaneous TID WC  . levothyroxine  125 mcg Oral QAC breakfast  . nystatin   Topical TID  . pantoprazole  40 mg Oral Daily  . rOPINIRole  0.5 mg Oral QHS   Continuous Infusions: . sodium chloride      PRN Meds: sodium chloride, acetaminophen, nitroGLYCERIN, ondansetron (ZOFRAN) IV, oxyCODONE, polyethylene glycol, tiZANidine, traMADol  Allergies:    Allergies  Allergen Reactions  . Chloraprep One Step [Chlorhexidine Gluconate] Rash  . Estrogens Other (See Comments)    PATIENT HAS A HISTORY OF CANCER AND HAS BEEN TOLD TO NEVER TAKE ANYTHING CONTAINING ESTROGEN, AS IT MIGHT CAUSE A RECURRENCE  . Shrimp [Shellfish Allergy] Nausea And Vomiting    Social History:   Social History   Socioeconomic History  . Marital status: Divorced    Spouse name: Not on file  . Number of children: Not on file  . Years of education: Not on file  . Highest education level: Not on file  Occupational History  . Not on file  Tobacco Use  . Smoking status: Never Smoker  . Smokeless tobacco: Never Used  Vaping Use  . Vaping Use: Never used  Substance and Sexual Activity  . Alcohol use: Yes    Comment: OCCASIONALLY  . Drug use: No  . Sexual activity: Not Currently    Birth control/protection: Surgical    Comment: hyst  Other Topics Concern  . Not on file  Social History Narrative  . Not on file   Social Determinants of Health   Financial Resource Strain: High Risk  . Difficulty of Paying Living Expenses: Very hard  Food Insecurity: Food Insecurity Present  . Worried About Charity fundraiser in the Last Year: Sometimes true  . Ran Out of Food in the Last Year: Sometimes true  Transportation Needs: No Transportation Needs  . Lack of Transportation (Medical): No  . Lack of Transportation (Non-Medical): No  Physical Activity: Not on file  Stress: Not on file  Social Connections: Not on file  Intimate Partner Violence: Not on file    Family History:    Family History  Problem Relation Age of Onset  . AAA (abdominal aortic aneurysm) Mother   . Diabetes Father   . Alzheimer's disease Father   . Throat cancer Sister   . Breast cancer Cousin      ROS:  Please see the history of  present illness.  All other ROS reviewed and negative.     Physical Exam/Data:   Vitals:   04/29/20 1815 04/29/20 2041 04/29/20 2122 04/30/20 0508  BP: 126/70 (!) 145/86 135/90 129/76  Pulse: 85 82 77 81  Resp: 20 17  19   Temp:   97.8 F (36.6 C) 98.3 F (36.8 C)  TempSrc:   Oral Oral  SpO2: 94% 95% 97% (!) 89%  Weight:      Height:       No intake or output data in the 24 hours ending 04/30/20 0806 Last 3 Weights 04/29/2020 04/20/2020 03/29/2020  Weight (lbs)  170 lb 9.6 oz 170 lb 9.6 oz 168 lb 9.6 oz  Weight (kg) 77.384 kg 77.384 kg 76.476 kg     Body mass index is 29.28 kg/m.  General:  Well nourished, well developed, in no acute distress HEENT: normal Lymph: no adenopathy Neck: no JVD Endocrine:  No thryomegaly Vascular: No carotid bruits; FA pulses 2+ bilaterally without bruits  Cardiac:  normal S1, S2; RRR; no murmur  Lungs:  clear to auscultation bilaterally, no wheezing, rhonchi or rales  Abd: soft, nontender, no hepatomegaly  Ext: no edema Musculoskeletal:  No deformities, BUE and BLE strength normal and equal Skin: warm and dry  Neuro:  CNs 2-12 intact, no focal abnormalities noted Psych:  Normal affect     Laboratory Data:  High Sensitivity Troponin:   Recent Labs  Lab 04/29/20 1632 04/29/20 1716  TROPONINIHS 5 2     Chemistry Recent Labs  Lab 04/29/20 1632 04/30/20 0351  NA 137 140  K 4.3 3.1*  CL 102 103  CO2 24 26  GLUCOSE 158* 93  BUN 10 11  CREATININE 0.60 0.50  CALCIUM 10.2 9.8  GFRNONAA >60 >60  ANIONGAP 11 11    No results for input(s): PROT, ALBUMIN, AST, ALT, ALKPHOS, BILITOT in the last 168 hours. Hematology Recent Labs  Lab 04/29/20 1632 04/30/20 0351  WBC 6.7 5.9  RBC 4.52 4.40  HGB 14.8 14.1  HCT 44.9 43.8  MCV 99.3 99.5  MCH 32.7 32.0  MCHC 33.0 32.2  RDW 14.1 14.3  PLT 159 148*   BNPNo results for input(s): BNP, PROBNP in the last 168 hours.  DDimer No results for input(s): DDIMER in the last 168  hours.   Radiology/Studies:  CT Angio Chest PE W/Cm &/Or Wo Cm  Result Date: 04/29/2020 CLINICAL DATA:  Left-sided chest pain EXAM: CT ANGIOGRAPHY CHEST WITH CONTRAST TECHNIQUE: Multidetector CT imaging of the chest was performed using the standard protocol during bolus administration of intravenous contrast. Multiplanar CT image reconstructions and MIPs were obtained to evaluate the vascular anatomy. CONTRAST:  86mL OMNIPAQUE IOHEXOL 350 MG/ML SOLN COMPARISON:  Chest x-ray from earlier in the same day. FINDINGS: Cardiovascular: Atherosclerotic calcifications of the thoracic aorta are noted. Mild dilatation of the ascending aorta is seen to 3.7 cm. Normal tapering in the aortic arch is noted. No dissection is seen. Heart is at the upper limits of normal in size. Heavy coronary calcifications are noted. The pulmonary artery shows a normal branching pattern without definitive intraluminal filling defect to suggest pulmonary embolism. Peripheral branches in the lower lobes are somewhat limited due to motion artifact. Mediastinum/Nodes: Thoracic inlet is within normal limits. Right chest wall port is seen in satisfactory position. No sizable hilar or mediastinal adenopathy is noted. The esophagus as visualized is within normal limits. Lungs/Pleura: Lungs are well aerated bilaterally with mild dependent atelectatic changes as well as diffuse emphysematous changes. No focal infiltrate or sizable effusion is seen. No sizable parenchymal nodules are noted. Upper Abdomen: Changes of prior cholecystectomy are seen. No acute upper abdominal abnormality is noted. Musculoskeletal: Degenerative changes of the thoracic spine are noted. Postsurgical changes in the cervical spine are seen as well. Review of the MIP images confirms the above findings. IMPRESSION: No definitive pulmonary emboli although the lower lobe branches are somewhat limited due to timing of the contrast bolus and mild motion artifact. Dilatation of the  ascending aorta 3.7 cm. No specific follow-up is recommended at this time. Bibasilar atelectasis. Aortic Atherosclerosis (ICD10-I70.0) and Emphysema (ICD10-J43.9).  Electronically Signed   By: Inez Catalina M.D.   On: 04/29/2020 18:50   DG Chest Portable 1 View  Result Date: 04/29/2020 CLINICAL DATA:  Chest pain. EXAM: PORTABLE CHEST 1 VIEW COMPARISON:  February 14, 2018 FINDINGS: The heart size and mediastinal contours are within normal limits. Right central venous line is identified with distal tip in the superior vena cava. Both lungs are clear. The visualized skeletal structures are unremarkable. IMPRESSION: No active disease. Electronically Signed   By: Abelardo Diesel M.D.   On: 04/29/2020 15:51     Assessment and Plan:   1. CAD/Chest pain - history of prior stent in 2017 - cath 2020 patent vessels and stent - some atypical factors to her pain in the fact it lasted hours, typical in the fact it resolved with NG and similar to her chest pain in 2017 prior to her stent.  - no objective evidence of ischemia by EKG or enzymes.   - would plan for lexiscan today to further evaluate for any underlying ishcemia - f/u echo     Risk Assessment/Risk Scores:     HEAR Score (for undifferentiated chest pain):  HEAR Score: 7          For questions or updates, please contact [place] Please consult [email or web address] for contact info under    Signed, Carlyle Dolly, MD  04/30/2020 8:06 AM

## 2020-04-30 NOTE — Consult Note (Signed)
Cardiology Consultation:   Patient ID: Felicia [name] MRN: [id number]; DOB: [birth year]  Admit date: 04/29/2020 Date of Consult: 04/30/2020  PCP:  Kathyrn Lass, MD   [facility]  Cardiologist:  Mertie Moores, MD  Advanced Practice Provider:  No care team member to display Electrophysiologist:  None  }    Patient Profile:   Felicia [name] is a 70 y.o. female with a hx of CAD with prior stent to LAD, DM2, HL  who is being seen today for the evaluation of chest pain at the request of Dr Avon Gully.  History of Present Illness:   Felicia [name] 70 yo female history of CAD with mid LAD stent in 2017, HL, DM2, breast cancer on chemo/radiation admitted with chest pain.  Reports episode yesterday of 5/10 chest pressure midchest radiating to left, felt clammy. Symptoms lasted a few hours at home before coming to ER. In ER received NG with complete resolution of chest pain. She reports symptoms somewhat similar to what she had in 2017 prior to her stent. Has had mild symptoms over the last few weeks, this was most intense  K 4.3 Cr 0.60 BUN 10 WBC 6.7 Hgb 14.8 Plt 159  Trop 5-->2 COVID neg CXR no acute process CT PE no PE though limited visualization EKG SR, nonspecific ST/T changes  Jan 2022 echo LVEF 60-65%, no WMAs, normal RV function 02/2018 cath: patent vessels and LAD stent Jan 2019 nuclear stress: no ischemia    Past Medical History:  Diagnosis Date  . Anemia   . Anxiety   . Breast cancer ([place]) 09/2019   left breast IMC  . CAD (coronary artery disease)    a. 3/2007s/p DES to the LAD [facility]); b. 01/2017 MV: EF 80%, small, mild apical ant defect w/o ischemia (felt to be breast atten). Low risk.  . Depression   . DM (diabetes mellitus) ([place])   . Essential hypertension   . GERD (gastroesophageal reflux disease)   . Headache    secondary to neck surgery per patient  . High triglycerides   . Hyperlipidemia   . Hypothyroid   . OA (osteoarthritis)     right knee,hands  . Overflow incontinence   . PONV (postoperative nausea and vomiting)    s/p gallbladder   . RLS (restless legs syndrome)   . Urinary urgency   . Uterine cancer ([place]) dx'd 2014    Past Surgical History:  Procedure Laterality Date  . BREAST LUMPECTOMY WITH RADIOACTIVE SEED AND SENTINEL LYMPH NODE BIOPSY Left 10/07/2019   Procedure: LEFT BREAST LUMPECTOMY WITH RADIOACTIVE SEED AND SENTINEL LYMPH NODE BIOPSY;  Surgeon: Rolm Bookbinder, MD;  Location: [facility];  Service: General;  Laterality: Left;  . CORONARY STENT PLACEMENT    . CORONARY/GRAFT ACUTE MI REVASCULARIZATION    . FINGER ARTHRODESIS Right 06/11/2019   Procedure: ARTHRODESIS INDEX FINGER DISTAL PHALANGEAL JOINT;  Surgeon: [name], MD;  Location: [facility];  Service: Orthopedics;  Laterality: Right;  . FOOT SURGERY Left   . LEFT HEART CATH AND CORONARY ANGIOGRAPHY N/A 02/16/2018   Procedure: LEFT HEART CATH AND CORONARY ANGIOGRAPHY;  Surgeon: Lorretta Harp, MD;  Location: [facility];  Service: Cardiovascular;  Laterality: N/A;  . PORTACATH PLACEMENT Right 11/23/2019   Procedure: INSERTION PORT-A-CATH WITH ULTRASOUND GUIDANCE;  Surgeon: Rolm Bookbinder, MD;  Location: [facility];  Service: General;  Laterality: Right;  . REPLACEMENT TOTAL KNEE Right   . SPINE SURGERY    .  TOTAL ABDOMINAL HYSTERECTOMY     FOR UTERUS CANCER  . TOTAL HIP ARTHROPLASTY Left      Inpatient Medications: Scheduled Meds: . aspirin EC  81 mg Oral Daily  . atorvastatin  40 mg Oral Daily  . bisoprolol  5 mg Oral QHS  . enoxaparin (LOVENOX) injection  40 mg Subcutaneous Q24H  . escitalopram  10 mg Oral QHS  . gabapentin  300 mg Oral QHS  . insulin aspart  0-9 Units Subcutaneous TID WC  . levothyroxine  125 mcg Oral QAC breakfast  . nystatin   Topical TID  . pantoprazole  40 mg Oral Daily  . rOPINIRole  0.5 mg Oral QHS   Continuous Infusions: . sodium chloride      PRN Meds: sodium chloride, acetaminophen, nitroGLYCERIN, ondansetron (ZOFRAN) IV, oxyCODONE, polyethylene glycol, tiZANidine, traMADol  Allergies:    Allergies  Allergen Reactions  . Chloraprep One Step [Chlorhexidine Gluconate] Rash  . Estrogens Other (See Comments)    PATIENT HAS A HISTORY OF CANCER AND HAS BEEN TOLD TO NEVER TAKE ANYTHING CONTAINING ESTROGEN, AS IT MIGHT CAUSE A RECURRENCE  . Shrimp [Shellfish Allergy] Nausea And Vomiting    Social History:   Social History   Socioeconomic History  . Marital status: Divorced    Spouse name: Not on file  . Number of children: Not on file  . Years of education: Not on file  . Highest education level: Not on file  Occupational History  . Not on file  Tobacco Use  . Smoking status: Never Smoker  . Smokeless tobacco: Never Used  Vaping Use  . Vaping Use: Never used  Substance and Sexual Activity  . Alcohol use: Yes    Comment: OCCASIONALLY  . Drug use: No  . Sexual activity: Not Currently    Birth control/protection: Surgical    Comment: hyst  Other Topics Concern  . Not on file  Social History Narrative  . Not on file   Social Determinants of Health   Financial Resource Strain: High Risk  . Difficulty of Paying Living Expenses: Very hard  Food Insecurity: Food Insecurity Present  . Worried About Charity fundraiser in the Last Year: Sometimes true  . Ran Out of Food in the Last Year: Sometimes true  Transportation Needs: No Transportation Needs  . Lack of Transportation (Medical): No  . Lack of Transportation (Non-Medical): No  Physical Activity: Not on file  Stress: Not on file  Social Connections: Not on file  Intimate Partner Violence: Not on file    Family History:    Family History  Problem Relation Age of Onset  . AAA (abdominal aortic aneurysm) Mother   . Diabetes Father   . Alzheimer's disease Father   . Throat cancer Sister   . Breast cancer Cousin      ROS:  Please see the history of  present illness.  All other ROS reviewed and negative.     Physical Exam/Data:   Vitals:   04/29/20 1815 04/29/20 2041 04/29/20 2122 04/30/20 0508  BP: 126/70 (!) 145/86 135/90 129/76  Pulse: 85 82 77 81  Resp: 20 17  19   Temp:   97.8 F (36.6 C) 98.3 F (36.8 C)  TempSrc:   Oral Oral  SpO2: 94% 95% 97% (!) 89%  Weight:      Height:       No intake or output data in the 24 hours ending 04/30/20 0806 Last 3 Weights 04/29/2020 04/20/2020 03/29/2020  Weight (lbs)  170 lb 9.6 oz 170 lb 9.6 oz 168 lb 9.6 oz  Weight (kg) 77.384 kg 77.384 kg 76.476 kg     Body mass index is 29.28 kg/m.  General:  Well nourished, well developed, in no acute distress HEENT: normal Lymph: no adenopathy Neck: no JVD Endocrine:  No thryomegaly Vascular: No carotid bruits; FA pulses 2+ bilaterally without bruits  Cardiac:  normal S1, S2; RRR; no murmur  Lungs:  clear to auscultation bilaterally, no wheezing, rhonchi or rales  Abd: soft, nontender, no hepatomegaly  Ext: no edema Musculoskeletal:  No deformities, BUE and BLE strength normal and equal Skin: warm and dry  Neuro:  CNs 2-12 intact, no focal abnormalities noted Psych:  Normal affect     Laboratory Data:  High Sensitivity Troponin:   Recent Labs  Lab 04/29/20 1632 04/29/20 1716  TROPONINIHS 5 2     Chemistry Recent Labs  Lab 04/29/20 1632 04/30/20 0351  NA 137 140  K 4.3 3.1*  CL 102 103  CO2 24 26  GLUCOSE 158* 93  BUN 10 11  CREATININE 0.60 0.50  CALCIUM 10.2 9.8  GFRNONAA >60 >60  ANIONGAP 11 11    No results for input(s): PROT, ALBUMIN, AST, ALT, ALKPHOS, BILITOT in the last 168 hours. Hematology Recent Labs  Lab 04/29/20 1632 04/30/20 0351  WBC 6.7 5.9  RBC 4.52 4.40  HGB 14.8 14.1  HCT 44.9 43.8  MCV 99.3 99.5  MCH 32.7 32.0  MCHC 33.0 32.2  RDW 14.1 14.3  PLT 159 148*   BNPNo results for input(s): BNP, PROBNP in the last 168 hours.  DDimer No results for input(s): DDIMER in the last 168  hours.   Radiology/Studies:  CT Angio Chest PE W/Cm &/Or Wo Cm  Result Date: 04/29/2020 CLINICAL DATA:  Left-sided chest pain EXAM: CT ANGIOGRAPHY CHEST WITH CONTRAST TECHNIQUE: Multidetector CT imaging of the chest was performed using the standard protocol during bolus administration of intravenous contrast. Multiplanar CT image reconstructions and MIPs were obtained to evaluate the vascular anatomy. CONTRAST:  87mL OMNIPAQUE IOHEXOL 350 MG/ML SOLN COMPARISON:  Chest x-ray from earlier in the same day. FINDINGS: Cardiovascular: Atherosclerotic calcifications of the thoracic aorta are noted. Mild dilatation of the ascending aorta is seen to 3.7 cm. Normal tapering in the aortic arch is noted. No dissection is seen. Heart is at the upper limits of normal in size. Heavy coronary calcifications are noted. The pulmonary artery shows a normal branching pattern without definitive intraluminal filling defect to suggest pulmonary embolism. Peripheral branches in the lower lobes are somewhat limited due to motion artifact. Mediastinum/Nodes: Thoracic inlet is within normal limits. Right chest wall port is seen in satisfactory position. No sizable hilar or mediastinal adenopathy is noted. The esophagus as visualized is within normal limits. Lungs/Pleura: Lungs are well aerated bilaterally with mild dependent atelectatic changes as well as diffuse emphysematous changes. No focal infiltrate or sizable effusion is seen. No sizable parenchymal nodules are noted. Upper Abdomen: Changes of prior cholecystectomy are seen. No acute upper abdominal abnormality is noted. Musculoskeletal: Degenerative changes of the thoracic spine are noted. Postsurgical changes in the cervical spine are seen as well. Review of the MIP images confirms the above findings. IMPRESSION: No definitive pulmonary emboli although the lower lobe branches are somewhat limited due to timing of the contrast bolus and mild motion artifact. Dilatation of the  ascending aorta 3.7 cm. No specific follow-up is recommended at this time. Bibasilar atelectasis. Aortic Atherosclerosis (ICD10-I70.0) and Emphysema (ICD10-J43.9).  Electronically Signed   By: Inez Catalina M.D.   On: 04/29/2020 18:50   DG Chest Portable 1 View  Result Date: 04/29/2020 CLINICAL DATA:  Chest pain. EXAM: PORTABLE CHEST 1 VIEW COMPARISON:  February 14, 2018 FINDINGS: The heart size and mediastinal contours are within normal limits. Right central venous line is identified with distal tip in the superior vena cava. Both lungs are clear. The visualized skeletal structures are unremarkable. IMPRESSION: No active disease. Electronically Signed   By: Abelardo Diesel M.D.   On: 04/29/2020 15:51     Assessment and Plan:   1. CAD/Chest pain - history of prior stent in 2017 - cath 2020 patent vessels and stent - some atypical factors to her pain in the fact it lasted hours, typical in the fact it resolved with NG and similar to her chest pain in 2017 prior to her stent.  - no objective evidence of ischemia by EKG or enzymes.   - would plan for lexiscan today to further evaluate for any underlying ishcemia - f/u echo     Risk Assessment/Risk Scores:     HEAR Score (for undifferentiated chest pain):  HEAR Score: 7          For questions or updates, please contact [facility] Please consult [email or web address] for contact info under    Signed, Carlyle Dolly, MD  04/30/2020 8:06 AM

## 2020-05-01 ENCOUNTER — Ambulatory Visit: Payer: Medicare Other

## 2020-05-01 ENCOUNTER — Telehealth: Payer: Self-pay | Admitting: Radiation Oncology

## 2020-05-01 ENCOUNTER — Encounter (HOSPITAL_COMMUNITY)
Admission: EM | Disposition: A | Discharge status: Home / Self Care | Payer: Self-pay | Source: Home / Self Care | Attending: Emergency Medicine

## 2020-05-01 DIAGNOSIS — F32A Depression, unspecified: Secondary | ICD-10-CM

## 2020-05-01 DIAGNOSIS — R0789 Other chest pain: Secondary | ICD-10-CM | POA: Diagnosis not present

## 2020-05-01 DIAGNOSIS — R079 Chest pain, unspecified: Secondary | ICD-10-CM | POA: Diagnosis not present

## 2020-05-01 DIAGNOSIS — F419 Anxiety disorder, unspecified: Secondary | ICD-10-CM

## 2020-05-01 DIAGNOSIS — I2510 Atherosclerotic heart disease of native coronary artery without angina pectoris: Secondary | ICD-10-CM | POA: Diagnosis not present

## 2020-05-01 HISTORY — PX: LEFT HEART CATH AND CORONARY ANGIOGRAPHY: CATH118249

## 2020-05-01 LAB — BASIC METABOLIC PANEL
Anion gap: 12 (ref 5–15)
BUN: 15 mg/dL (ref 8–23)
CO2: 22 mmol/L (ref 22–32)
Calcium: 9.6 mg/dL (ref 8.9–10.3)
Chloride: 102 mmol/L (ref 98–111)
Creatinine, Ser: 0.46 mg/dL (ref 0.44–1.00)
GFR, Estimated: >60 mL/min (ref >60)
Glucose, Bld: 134 mg/dL — ABNORMAL HIGH (ref 70–99)
Potassium: 3.8 mmol/L (ref 3.5–5.1)
Sodium: 136 mmol/L (ref 135–145)

## 2020-05-01 LAB — GLUCOSE, CAPILLARY
Glucose-Capillary: 150 mg/dL — ABNORMAL HIGH (ref 70–99)
Glucose-Capillary: 144 mg/dL — ABNORMAL HIGH (ref 70–99)
Glucose-Capillary: 117 mg/dL — ABNORMAL HIGH (ref 70–99)

## 2020-05-01 LAB — HEMOGLOBIN A1C
Hgb A1c MFr Bld: 8.5 % — ABNORMAL HIGH (ref 4.8–5.6)
Mean Plasma Glucose: 197 mg/dL

## 2020-05-01 LAB — CBC
HCT: 42.5 % (ref 36.0–46.0)
Hemoglobin: 13.7 g/dL (ref 12.0–15.0)
MCH: 32.4 pg (ref 26.0–34.0)
MCHC: 32.2 g/dL (ref 30.0–36.0)
MCV: 100.5 fL — ABNORMAL HIGH (ref 80.0–100.0)
Platelets: 131 10*3/uL — ABNORMAL LOW (ref 150–400)
RBC: 4.23 MIL/uL (ref 3.87–5.11)
RDW: 14.4 % (ref 11.5–15.5)
WBC: 5.4 10*3/uL (ref 4.0–10.5)
nRBC: 0 % (ref 0.0–0.2)

## 2020-05-01 LAB — MAGNESIUM: Magnesium: 2 mg/dL (ref 1.7–2.4)

## 2020-05-01 LAB — TSH: TSH: 12.574 u[IU]/mL — ABNORMAL HIGH (ref 0.350–4.500)

## 2020-05-01 SURGERY — LEFT HEART CATH AND CORONARY ANGIOGRAPHY
Anesthesia: LOCAL

## 2020-05-01 MED ORDER — ASPIRIN 81 MG PO CHEW
81.0000 mg | CHEWABLE_TABLET | Freq: Every day | ORAL | Status: DC
  Administered 2020-05-01: 81 mg via ORAL
  Filled 2020-05-01: qty 1

## 2020-05-01 MED ORDER — HEPARIN (PORCINE) IN NACL 1000-0.9 UT/500ML-% IV SOLN
INTRAVENOUS | Status: DC | PRN
  Administered 2020-05-01 (×2): 500 mL

## 2020-05-01 MED ORDER — HYDRALAZINE HCL 20 MG/ML IJ SOLN: 10.0000 mg | INTRAMUSCULAR | Status: DC | PRN

## 2020-05-01 MED ORDER — VERAPAMIL HCL 2.5 MG/ML IV SOLN
INTRA_ARTERIAL | Status: DC | PRN
  Administered 2020-05-01: 7.5 mL via INTRA_ARTERIAL

## 2020-05-01 MED ORDER — SODIUM CHLORIDE 0.9 % IV SOLN: INTRAVENOUS | Status: AC

## 2020-05-01 MED ORDER — MIDAZOLAM HCL 2 MG/2ML IJ SOLN
INTRAMUSCULAR | Status: AC
  Filled 2020-05-01: qty 2

## 2020-05-01 MED ORDER — HEPARIN SODIUM (PORCINE) 1000 UNIT/ML IJ SOLN
INTRAMUSCULAR | Status: AC
  Filled 2020-05-01: qty 1

## 2020-05-01 MED ORDER — FENTANYL CITRATE (PF) 100 MCG/2ML IJ SOLN
INTRAMUSCULAR | Status: DC | PRN
  Administered 2020-05-01 (×2): 25 ug via INTRAVENOUS

## 2020-05-01 MED ORDER — ACETAMINOPHEN 325 MG PO TABS: 650.0000 mg | ORAL_TABLET | ORAL | Status: DC | PRN

## 2020-05-01 MED ORDER — LABETALOL HCL 5 MG/ML IV SOLN: 10.0000 mg | INTRAVENOUS | Status: DC | PRN

## 2020-05-01 MED ORDER — HEPARIN (PORCINE) IN NACL 1000-0.9 UT/500ML-% IV SOLN
INTRAVENOUS | Status: AC
  Filled 2020-05-01: qty 1500

## 2020-05-01 MED ORDER — SODIUM CHLORIDE 0.9 % WEIGHT BASED INFUSION: 3.0000 mL/kg/h | INTRAVENOUS | Status: DC

## 2020-05-01 MED ORDER — SODIUM CHLORIDE 0.9% FLUSH
3.0000 mL | Freq: Two times a day (BID) | INTRAVENOUS | Status: DC
  Administered 2020-05-01: 3 mL via INTRAVENOUS

## 2020-05-01 MED ORDER — FENTANYL CITRATE (PF) 100 MCG/2ML IJ SOLN
INTRAMUSCULAR | Status: AC
  Filled 2020-05-01: qty 2

## 2020-05-01 MED ORDER — SODIUM CHLORIDE 0.9 % WEIGHT BASED INFUSION: 1.0000 mL/kg/h | INTRAVENOUS | Status: DC

## 2020-05-01 MED ORDER — VERAPAMIL HCL 2.5 MG/ML IV SOLN
INTRAVENOUS | Status: AC
  Filled 2020-05-01: qty 2

## 2020-05-01 MED ORDER — SODIUM CHLORIDE 0.9% FLUSH: 3.0000 mL | INTRAVENOUS | Status: DC | PRN

## 2020-05-01 MED ORDER — HEPARIN SODIUM (PORCINE) 1000 UNIT/ML IJ SOLN
INTRAMUSCULAR | Status: DC | PRN
Start: 2020-05-01 — End: 2020-05-01
  Administered 2020-05-01: 4000 [IU] via INTRAVENOUS

## 2020-05-01 MED ORDER — MORPHINE SULFATE (PF) 2 MG/ML IV SOLN: 2.0000 mg | INTRAVENOUS | Status: DC | PRN

## 2020-05-01 MED ORDER — MIDAZOLAM HCL 2 MG/2ML IJ SOLN
INTRAMUSCULAR | Status: DC | PRN
  Administered 2020-05-01: 1 mg via INTRAVENOUS

## 2020-05-01 MED ORDER — IOHEXOL 350 MG/ML SOLN
INTRAVENOUS | Status: DC | PRN
Start: 2020-05-01 — End: 2020-05-01
  Administered 2020-05-01: 50 mL

## 2020-05-01 MED ORDER — SODIUM CHLORIDE 0.9 % IV SOLN: 250.0000 mL | INTRAVENOUS | Status: DC | PRN

## 2020-05-01 MED ORDER — LIDOCAINE HCL (PF) 1 % IJ SOLN
INTRAMUSCULAR | Status: AC
  Filled 2020-05-01: qty 30

## 2020-05-01 MED ORDER — LIDOCAINE HCL (PF) 1 % IJ SOLN
INTRAMUSCULAR | Status: DC | PRN
  Administered 2020-05-01: 2 mL via INTRADERMAL

## 2020-05-01 MED ORDER — NITROGLYCERIN 1 MG/10 ML FOR IR/CATH LAB
INTRA_ARTERIAL | Status: AC
  Filled 2020-05-01: qty 10

## 2020-05-01 MED ORDER — ONDANSETRON HCL 4 MG/2ML IJ SOLN: 4.0000 mg | Freq: Four times a day (QID) | INTRAMUSCULAR | Status: DC | PRN

## 2020-05-01 SURGICAL SUPPLY — 12 items
CATH INFINITI 5FR ANG PIGTAIL (CATHETERS) ×2 IMPLANT
CATH OPTITORQUE TIG 4.0 5F (CATHETERS) ×2 IMPLANT
DEVICE RAD COMP TR BAND LRG (VASCULAR PRODUCTS) ×2 IMPLANT
GLIDESHEATH SLEND A-KIT 6F 22G (SHEATH) ×2 IMPLANT
GUIDEWIRE INQWIRE 1.5J.035X260 (WIRE) ×2 IMPLANT
INQWIRE 1.5J .035X260CM (WIRE) ×4
KIT HEART LEFT (KITS) ×2 IMPLANT
PACK CARDIAC CATHETERIZATION (CUSTOM PROCEDURE TRAY) ×2 IMPLANT
SYR MEDRAD MARK 7 150ML (SYRINGE) ×2 IMPLANT
TRANSDUCER W/STOPCOCK (MISCELLANEOUS) ×2 IMPLANT
TUBING CIL FLEX 10 FLL-RA (TUBING) ×2 IMPLANT
WIRE HI TORQ VERSACORE-J 145CM (WIRE) ×2 IMPLANT

## 2020-05-01 NOTE — Progress Notes (Addendum)
Progress Note  Patient Name: Felicia [name] Date of Encounter: 05/01/2020  [facility] Cardiologist: Mertie Moores, MD   Subjective   No CP overnight, wanted daughter involved in cath decision.  Dtr Otila Kluver contacted by phone.   Inpatient Medications    Scheduled Meds: . aspirin EC  81 mg Oral Daily  . atorvastatin  40 mg Oral Daily  . bisoprolol  5 mg Oral QHS  . enoxaparin (LOVENOX) injection  40 mg Subcutaneous Q24H  . escitalopram  10 mg Oral QHS  . gabapentin  300 mg Oral QHS  . insulin aspart  0-9 Units Subcutaneous TID WC  . levothyroxine  125 mcg Oral QAC breakfast  . nystatin   Topical TID  . pantoprazole  40 mg Oral Daily  . rOPINIRole  0.5 mg Oral QHS   Continuous Infusions: . sodium chloride     PRN Meds: sodium chloride, acetaminophen, nitroGLYCERIN, ondansetron (ZOFRAN) IV, oxyCODONE, polyethylene glycol, tiZANidine, traMADol   Vital Signs    Vitals:   04/30/20 1512 04/30/20 1848 04/30/20 2038 05/01/20 0556  BP: 125/85 123/66 114/65 124/72  Pulse: 75 63 78 81  Resp: 20 (!) 23 16 16   Temp: 98.2 F (36.8 C) 98.3 F (36.8 C) 98.3 F (36.8 C) 98.2 F (36.8 C)  TempSrc: Oral Oral Oral Oral  SpO2: 93% 94% 91% 91%  Weight:      Height:       No intake or output data in the 24 hours ending 05/01/20 0751 Last 3 Weights 04/29/2020 04/20/2020 03/29/2020  Weight (lbs) 170 lb 9.6 oz 170 lb 9.6 oz 168 lb 9.6 oz  Weight (kg) 77.384 kg 77.384 kg 76.476 kg      Telemetry    SR, Sinus brady to high 40s - Personally Reviewed  ECG    None today - Personally Reviewed  Physical Exam   GEN: No acute distress.   Neck: No JVD Cardiac: RRR, soft murmur, no rubs, or gallops.  Respiratory: Clear to auscultation bilaterally, but deep inspiration painful. GI: Soft, nontender, non-distended  MS: No edema; No deformity. Neuro:  Nonfocal  Psych: Normal affect   Labs    High Sensitivity Troponin:   Recent Labs  Lab 04/29/20 1632 04/29/20 1716  TROPONINIHS 5  2      Chemistry Recent Labs  Lab 04/29/20 1632 04/30/20 0351 05/01/20 0347  NA 137 140 136  K 4.3 3.1* 3.8  CL 102 103 102  CO2 24 26 22   GLUCOSE 158* 93 134*  BUN 10 11 15   CREATININE 0.60 0.50 0.46  CALCIUM 10.2 9.8 9.6  GFRNONAA >60 >60 >60  ANIONGAP 11 11 12    Lab Results  Component Value Date   ALT 32 04/20/2020   AST 31 04/20/2020   ALKPHOS 71 04/20/2020   BILITOT 0.4 04/20/2020    Hematology Recent Labs  Lab 04/29/20 1632 04/30/20 0351 05/01/20 0347  WBC 6.7 5.9 5.4  RBC 4.52 4.40 4.23  HGB 14.8 14.1 13.7  HCT 44.9 43.8 42.5  MCV 99.3 99.5 100.5*  MCH 32.7 32.0 32.4  MCHC 33.0 32.2 32.2  RDW 14.1 14.3 14.4  PLT 159 148* 131*   Lab Results  Component Value Date   CHOL 152 09/27/2019   HDL 52 09/27/2019   LDLCALC 57 09/27/2019   TRIG 277 (H) 09/27/2019   CHOLHDL 2.9 09/27/2019   Lab Results  Component Value Date   TSH 12.574 (H) 05/01/2020  No results found for: FREET4  No components found  for: T3, FREE   Lab Results  Component Value Date   HGBA1C 8.5 (H) 04/30/2020    BNPNo results for input(s): BNP, PROBNP in the last 168 hours.   DDimer No results for input(s): DDIMER in the last 168 hours.   Radiology    CT Angio Chest PE W/Cm &/Or Wo Cm  Result Date: 04/29/2020 CLINICAL DATA:  Left-sided chest pain EXAM: CT ANGIOGRAPHY CHEST WITH CONTRAST TECHNIQUE: Multidetector CT imaging of the chest was performed using the standard protocol during bolus administration of intravenous contrast. Multiplanar CT image reconstructions and MIPs were obtained to evaluate the vascular anatomy. CONTRAST:  47mL OMNIPAQUE IOHEXOL 350 MG/ML SOLN COMPARISON:  Chest x-ray from earlier in the same day. FINDINGS: Cardiovascular: Atherosclerotic calcifications of the thoracic aorta are noted. Mild dilatation of the ascending aorta is seen to 3.7 cm. Normal tapering in the aortic arch is noted. No dissection is seen. Heart is at the upper limits of normal in size.  Heavy coronary calcifications are noted. The pulmonary artery shows a normal branching pattern without definitive intraluminal filling defect to suggest pulmonary embolism. Peripheral branches in the lower lobes are somewhat limited due to motion artifact. Mediastinum/Nodes: Thoracic inlet is within normal limits. Right chest wall port is seen in satisfactory position. No sizable hilar or mediastinal adenopathy is noted. The esophagus as visualized is within normal limits. Lungs/Pleura: Lungs are well aerated bilaterally with mild dependent atelectatic changes as well as diffuse emphysematous changes. No focal infiltrate or sizable effusion is seen. No sizable parenchymal nodules are noted. Upper Abdomen: Changes of prior cholecystectomy are seen. No acute upper abdominal abnormality is noted. Musculoskeletal: Degenerative changes of the thoracic spine are noted. Postsurgical changes in the cervical spine are seen as well. Review of the MIP images confirms the above findings. IMPRESSION: No definitive pulmonary emboli although the lower lobe branches are somewhat limited due to timing of the contrast bolus and mild motion artifact. Dilatation of the ascending aorta 3.7 cm. No specific follow-up is recommended at this time. Bibasilar atelectasis. Aortic Atherosclerosis (ICD10-I70.0) and Emphysema (ICD10-J43.9). Electronically Signed   By: Inez Catalina M.D.   On: 04/29/2020 18:50   NM Myocar Multi W/Spect W/Wall Motion / EF  Result Date: 04/30/2020 CLINICAL DATA:  Chest pain EXAM: MYOCARDIAL IMAGING WITH SPECT (REST AND EXERCISE) GATED LEFT VENTRICULAR WALL MOTION STUDY LEFT VENTRICULAR EJECTION FRACTION TECHNIQUE: Standard myocardial SPECT imaging was performed after resting intravenous injection of 10 mCi Tc-54m tetrofosmin. Subsequently, exercise tolerance test was performed by the patient under the supervision of the Cardiology staff. At peak-stress, 32 mCi Tc-37m tetrofosminwas injected intravenously and  standard myocardial SPECT imaging was performed. Quantitative gated imaging was also performed to evaluate left ventricular wall motion, and estimate left ventricular ejection fraction. COMPARISON:  None. FINDINGS: Perfusion: Subtle area of decreased activity noted in the anterior apical wall on stress images, normal on rest images. No other reversible areas. Wall Motion: Normal left ventricular wall motion. No left ventricular dilation. Left Ventricular Ejection Fraction: 65 % End diastolic volume 48 ml End systolic volume 17 ml IMPRESSION: 1. Subtle area of reversibility in the anteroapical wall concerning for ischemia. 2. Normal left ventricular wall motion. 3. Left ventricular ejection fraction 65% 4. Non invasive risk stratification*: Intermediate *2012 Appropriate Use Criteria for Coronary Revascularization Focused Update: J Am Coll Cardiol. 7829;56(2):130-865. http://content.[email or web address]?articleid=[email or web address] Electronically Signed   By: Rolm Baptise M.D.   On: 04/30/2020 22:25   DG Chest Portable 1 View  Result Date: 04/29/2020 CLINICAL DATA:  Chest pain. EXAM: PORTABLE CHEST 1 VIEW COMPARISON:  February 14, 2018 FINDINGS: The heart size and mediastinal contours are within normal limits. Right central venous line is identified with distal tip in the superior vena cava. Both lungs are clear. The visualized skeletal structures are unremarkable. IMPRESSION: No active disease. Electronically Signed   By: Abelardo Diesel M.D.   On: 04/29/2020 15:51    Cardiac Studies   ECHO: Ordered ECHO: 01/17/2020 1. Left ventricular ejection fraction, by estimation, is 60 to 65%. The  left ventricle has normal function. The left ventricle has no regional  wall motion abnormalities. There is mild concentric left ventricular  hypertrophy. Left ventricular diastolic  parameters are indeterminate. The average left ventricular global  longitudinal strain is -22.4 %. The global longitudinal strain is normal.   2. Right ventricular systolic function is normal. The right ventricular  size is normal. There is normal pulmonary artery systolic pressure.  3. The mitral valve is normal in structure. Trivial mitral valve  regurgitation. No evidence of mitral stenosis.  4. The aortic valve is tricuspid. Aortic valve regurgitation is mild. No  aortic stenosis is present.  5. Aortic dilatation noted. There is mild dilatation of the aortic root,  measuring 39 mm.  6. The inferior vena cava is normal in size with greater than 50%  respiratory variability, suggesting right atrial pressure of 3 mmHg.   MYOVIEW: 04/30/2020 FINDINGS: Perfusion: Subtle area of decreased activity noted in the anterior apical wall on stress images, normal on rest images. No other reversible areas.  Wall Motion: Normal left ventricular wall motion. No left ventricular dilation.  Left Ventricular Ejection Fraction: 65 %  End diastolic volume 48 ml  End systolic volume 17 ml  IMPRESSION: 1. Subtle area of reversibility in the anteroapical wall concerning for ischemia.  2. Normal left ventricular wall motion.  3. Left ventricular ejection fraction 65%  4. Non invasive risk stratification*: Intermediate  Patient Profile     70 y.o. female w/ hx Resolute Integrity 2.25 x 18 mm stent - mid LAD 03/2015, patent at cath 02/2018, DM, HTN, HLD, hypothyroid, L breast CA s/p lumpectomy w/ ongoing XRT/chemo, was admitted 04/16 w/ CP, MV abnl >> intermediate risk.  Assessment & Plan    1. Chest pain, unstable angina - started at rest - reminded her of pre-PCI sx - trop neg MI, but MV is abnl w/ area of reversibility anterior apical wall - discussed cath w/ pt and dtr (by phone) - Cardiac catheterization was discussed with the patient fully. The patient understands that risks include but are not limited to stroke (1 in 1000), death (1 in 35), kidney failure [usually temporary] (1 in 500), bleeding (1 in 200),  allergic reaction [possibly serious] (1 in 200).  The patient understands and is willing to proceed.   - pt put on board and orders written  2. Pleuritic chest pain - has pain w/ deep inspiration - started after XRT initiated to L chest - rx per IM  3. Breast CA - has started XRT/Chemo - is to get XRT today at 11:00 am - per IM, but will cancel the appt today  Per IM Principal Problem:   Chest pain Active Problems:   Coronary artery disease involving native coronary artery of native heart without angina pectoris   Type 2 diabetes mellitus without complication, without long-term current use of insulin (HCC)   Malignant neoplasm of upper-outer quadrant of left breast in female, estrogen receptor positive ([place])  Hypothyroid   Anxiety   Depression   Pressure injury of skin       For questions or updates, please contact [facility] Please consult [email or web address] for contact info under        Signed, Rosaria Ferries, PA-C  05/01/2020, 7:51 AM   Patient seen and examined with Rosaria Ferries PA-C.  Agree as above, with the following exceptions and changes as noted below. Patient feels next step LHC is appropriate and she is willing to proceed. Hx of LAD stent, now with anterior defect on nuc imaging . Gen: NAD, CV: RRR, no murmurs, Lungs: clear, Abd: soft, Extrem: Warm, well perfused, no edema, Neuro/Psych: alert and oriented x 3, normal mood and affect. All available labs, radiology testing, previous records reviewed. She will be tranfered to [facility] for LHC.  Elouise Munroe, MD 05/01/20 11:30 AM  ADDENDUM: stable cardiac cath with no new obstructive lesion and widely patent mid LAD stent.  D/w internal medicine, stable for hospital discharge from cardiac perspective.

## 2020-05-01 NOTE — Progress Notes (Signed)
TR BAND REMOVAL  LOCATION:    Radial rt radial arterial site  DEFLATED PER PROTOCOL:   yes  TIME BAND OFF / DRESSING APPLIED:    1545/gauze and tegaderm  SITE UPON ARRIVAL:    Level 0  SITE AFTER BAND REMOVAL:    Level 0  CIRCULATION SENSATION AND MOVEMENT:    Within Normal Limits : rt hand and fingers warm and pink; palpable rt radial pulse; sensation present;rt arm elevated on pillow  COMMENTS:

## 2020-05-01 NOTE — Telephone Encounter (Signed)
ICU called stating that pt was admitted and would miss her radiation treatment today at 11:05a. I called Jarrett Soho, therapist on Machine 3. She states that she will let Dr. Pearlie Oyster nurse know.

## 2020-05-01 NOTE — Progress Notes (Signed)
Provided and explained discharge instructions. Addressed all questions and concerns. Removed IV and intact. Jerene Pitch

## 2020-05-01 NOTE — Progress Notes (Signed)
Wound under left breast is 3x bigger than admission. Very red and slightly open skin. Have been doing nystatin powder. This AM did put a dab of antibiotic ointment and 1 3x9 petroleum gauze. Per pt when breast is touching skin its more painful. Consider diflucan pill or wound care consult? Unsure if skin can handle another dose of radiation. New red skin on left shoulder/collar bone area is tender to touch.

## 2020-05-01 NOTE — Discharge Summary (Signed)
Physician Discharge Summary  Felicia [name] DUK:[id number] DOB: 13-Nov-1950 DOA: 04/29/2020  PCP: Kathyrn Lass, MD  Admit date: 04/29/2020 Discharge date: 05/01/2020  Admitted From: Home Disposition: Home  Recommendations for Outpatient Follow-up:  1. Follow up with PCP in 1-2 weeks 2. Please obtain BMP/CBC in one week 3. Please follow up with cardiology as scheduled  Home Health: None Equipment/Devices: None  Discharge Condition: Stable CODE STATUS: Full full Diet recommendation: Low-fat cardiac, low-carb diet  Brief/Interim Summary: Felicia [name] a 70 y.o.femalewith medical history significant forcancer of the left breast undergoing chemotherapy and radiation, hypothyroidism, type 2 diabetes mellitus, osteoarthritis, and coronary artery disease with LAD stent, presenting to the emergency department for evaluation of chest pain. Patient reports some chest discomfort that was intermittent yesterday, associated with shortness of breath, onset at rest, and without any alleviating or exacerbating factors identified. Discomfort became more intense today, she had difficulty taking a deep breath, reports radiation from the left chest to her left shoulder and upper arm, and describes symptoms as similar to what she experienced prior to her LAD stent that there was no radiation at that time. She denies cough, leg swelling, fevers, chills, or calf tenderness. She has a small wound and maceration under the left breast for the past 2 weeks but does not believe that is related.In ED: EKG features sinus rhythm. Chest x-ray negative for acute cardiopulmonary disease. CTA chest negative for PE though assessment of lower lobe branches is limited. Troponin negative, hospitalist called for admission.  Patient admitted as above with atypical chest pain, known CAD history with LAD stent 2018, unfortunately patient did have atypical stress test with reversible inferior anterior apex concerning for  ischemia.  Cardiology consulted, cardiac cath on 05/01/2020 was unremarkable per discussion with cardiology, formal report pending but agreeable for discharge home given patient's improvement in symptoms and negative imaging.  Patient is close follow-up with PCP, cardiology and radiation oncology in the outpatient setting for ongoing treatment.  Patient is worried about skin changes from recent radiology treatment; recommend outpatient wound care follow-up as well as necessary.  Discharge Diagnoses:  Principal Problem:   Chest pain Active Problems:   Coronary artery disease involving native coronary artery of native heart without angina pectoris   Type 2 diabetes mellitus without complication, without long-term current use of insulin (HCC)   Malignant neoplasm of upper-outer quadrant of left breast in female, estrogen receptor positive (HCC)   Hypothyroid   Anxiety   Depression   Pressure injury of skin    Discharge Instructions  Discharge Instructions    Call MD for:  severe uncontrolled pain   Complete by: As directed    Diet - low sodium heart healthy   Complete by: As directed    Discharge wound care:   Complete by: As directed    Breast Left;Lower Stage 1   -  Intact skin with non-blanchable redness of a localized area usually over a bony prominence. red,scabbed area, yeast-post radiation wound from breast cancer   Increase activity slowly   Complete by: As directed      Allergies as of 05/01/2020      Reactions   Chloraprep One Step [chlorhexidine Gluconate] Rash   Estrogens Other (See Comments)   PATIENT HAS A HISTORY OF CANCER AND HAS BEEN TOLD TO NEVER TAKE ANYTHING CONTAINING ESTROGEN, AS IT MIGHT CAUSE A RECURRENCE   Shrimp [shellfish Allergy] Nausea And Vomiting      Medication List    TAKE these medications  Accu-Chek Guide test strip Generic drug: glucose blood 1 each by Other route as needed.   aspirin EC 81 MG tablet Take 81 mg by mouth daily.    atorvastatin 40 MG tablet Commonly known as: LIPITOR TAKE 1 TABLET BY MOUTH EVERY DAY   bisoprolol 5 MG tablet Commonly known as: ZEBETA TAKE 1 TABLET BY MOUTH EVERY DAY   CALCIUM + D PO Take 1 tablet by mouth daily with supper.   CINNAMON PO Take 1 capsule by mouth daily with supper.   diclofenac sodium 1 % Gel Commonly known as: VOLTAREN Apply 2-4 g topically 4 (four) times daily as needed (as directed for pain).   escitalopram 10 MG tablet Commonly known as: LEXAPRO Take 10 mg by mouth at bedtime.   gabapentin 300 MG capsule Commonly known as: NEURONTIN Take 1 capsule (300 mg total) by mouth at bedtime.   glipiZIDE XL 10 MG 24 hr tablet Generic drug: glipiZIDE Take 20 mg by mouth daily.   IRON PO Take 1 tablet by mouth daily. alternates days:1 tablet one day and 2 tablets the next day   Jardiance 25 MG Tabs tablet Generic drug: empagliflozin Take 25 mg by mouth every morning.   levothyroxine 125 MCG tablet Commonly known as: SYNTHROID Take 125 mcg by mouth daily before breakfast.   metFORMIN 500 MG tablet Commonly known as: GLUCOPHAGE Take 500 mg by mouth 2 (two) times daily with a meal.   metroNIDAZOLE 1 % gel Commonly known as: METROGEL Apply 1 application topically as needed (as directed to affected area).   nitroGLYCERIN 0.4 MG SL tablet Commonly known as: NITROSTAT Place 1 tablet (0.4 mg total) under the tongue every 5 (five) minutes as needed for chest pain.   omeprazole 40 MG capsule Commonly known as: PRILOSEC Take 40 mg by mouth daily with supper.   ONE-A-DAY WOMENS PO Take 1 tablet by mouth daily.   prochlorperazine 10 MG tablet Commonly known as: COMPAZINE Take 1 tablet by mouth before meals and at bedtime on days 2 and 3 after chemotherapy (counting chemotherapy day as day 1); after that may take as needed   rOPINIRole 0.5 MG tablet Commonly known as: REQUIP Take 0.5 mg by mouth at bedtime. 1-3 hours prior to bedtime   tiZANidine 4  MG tablet Commonly known as: ZANAFLEX Take 4 mg by mouth every 8 (eight) hours as needed for muscle spasms. As needed   traMADol 50 MG tablet Commonly known as: ULTRAM Take 50 mg by mouth every 6 (six) hours as needed for moderate pain.   Trulicity 1.5 FW/2.6VZ Sopn Generic drug: Dulaglutide Inject 1.5 mg into the skin once a week.   TURMERIC PO Take 1 capsule by mouth daily with supper.   VITAMIN C PO Take 1 tablet by mouth daily.            Discharge Care Instructions  (From admission, onward)         Start     Ordered   05/01/20 0000  Discharge wound care:       Comments: Breast Left;Lower Stage 1   -  Intact skin with non-blanchable redness of a localized area usually over a bony prominence. red,scabbed area, yeast-post radiation wound from breast cancer   05/01/20 1553          Allergies  Allergen Reactions  . Chloraprep One Step [Chlorhexidine Gluconate] Rash  . Estrogens Other (See Comments)    PATIENT HAS A HISTORY OF CANCER AND HAS BEEN TOLD  TO NEVER TAKE ANYTHING CONTAINING ESTROGEN, AS IT MIGHT CAUSE A RECURRENCE  . Shrimp [Shellfish Allergy] Nausea And Vomiting    Consultations:  Cardiology   Procedures/Studies: CT Angio Chest PE W/Cm &/Or Wo Cm  Result Date: 04/29/2020 CLINICAL DATA:  Left-sided chest pain EXAM: CT ANGIOGRAPHY CHEST WITH CONTRAST TECHNIQUE: Multidetector CT imaging of the chest was performed using the standard protocol during bolus administration of intravenous contrast. Multiplanar CT image reconstructions and MIPs were obtained to evaluate the vascular anatomy. CONTRAST:  8mL OMNIPAQUE IOHEXOL 350 MG/ML SOLN COMPARISON:  Chest x-ray from earlier in the same day. FINDINGS: Cardiovascular: Atherosclerotic calcifications of the thoracic aorta are noted. Mild dilatation of the ascending aorta is seen to 3.7 cm. Normal tapering in the aortic arch is noted. No dissection is seen. Heart is at the upper limits of normal in size. Heavy  coronary calcifications are noted. The pulmonary artery shows a normal branching pattern without definitive intraluminal filling defect to suggest pulmonary embolism. Peripheral branches in the lower lobes are somewhat limited due to motion artifact. Mediastinum/Nodes: Thoracic inlet is within normal limits. Right chest wall port is seen in satisfactory position. No sizable hilar or mediastinal adenopathy is noted. The esophagus as visualized is within normal limits. Lungs/Pleura: Lungs are well aerated bilaterally with mild dependent atelectatic changes as well as diffuse emphysematous changes. No focal infiltrate or sizable effusion is seen. No sizable parenchymal nodules are noted. Upper Abdomen: Changes of prior cholecystectomy are seen. No acute upper abdominal abnormality is noted. Musculoskeletal: Degenerative changes of the thoracic spine are noted. Postsurgical changes in the cervical spine are seen as well. Review of the MIP images confirms the above findings. IMPRESSION: No definitive pulmonary emboli although the lower lobe branches are somewhat limited due to timing of the contrast bolus and mild motion artifact. Dilatation of the ascending aorta 3.7 cm. No specific follow-up is recommended at this time. Bibasilar atelectasis. Aortic Atherosclerosis (ICD10-I70.0) and Emphysema (ICD10-J43.9). Electronically Signed   By: Inez Catalina M.D.   On: 04/29/2020 18:50   MR Brain W Wo Contrast  Result Date: 04/11/2020 CLINICAL DATA:  Breast cancer, staging.  Endometrial cancer. EXAM: MRI HEAD WITHOUT AND WITH CONTRAST TECHNIQUE: Multiplanar, multiecho pulse sequences of the brain and surrounding structures were obtained without and with intravenous contrast. CONTRAST:  68mL GADAVIST GADOBUTROL 1 MMOL/ML IV SOLN COMPARISON:  None. FINDINGS: Brain: No acute infarction, hemorrhage, hydrocephalus, extra-axial collection or mass lesion. Scattered foci of T2 hyperintensity are seen within the white matter of the  cerebral hemispheres, nonspecific, most likely related to chronic small vessel ischemia. No focus of abnormal contrast enhancement. Vascular: Normal flow voids. Skull and upper cervical spine: Normal marrow signal. Sinuses/Orbits: Negative. IMPRESSION: 1. No evidence of intracranial metastatic disease. 2. Mild chronic small vessel ischemia. Electronically Signed   By: Pedro Earls M.D.   On: 04/11/2020 12:36   CARDIAC CATHETERIZATION  Result Date: 05/01/2020  Previously placed Mid LAD stent (unknown type) is widely patent.  The left ventricular systolic function is normal.  LV end diastolic pressure is normal.  The left ventricular ejection fraction is 55-65% by visual estimate.  Pranavi Aure is a 70 y.o. female  [id number] LOCATION:  FACILITY: [facility] PHYSICIAN: [name], M.D. [birth year] DATE OF PROCEDURE:  05/01/2020 DATE OF DISCHARGE: CARDIAC CATHETERIZATION History obtained from chart review.70 y.o. female w/ hx Resolute Integrity 2.25 x 18 mm stent - mid LAD 03/2015, patent at cath 02/2018, DM, HTN, HLD, hypothyroid, L breast CA s/p lumpectomy  w/ ongoing XRT/chemo, was admitted 04/16 w/ CP, MV abnl >> intermediate risk.  Her enzymes were negative.  Her EKG showed no acute changes.  She presents now for diagnostic coronary angiography.   Ms. Tesmer has a widely patent mid LAD stent and otherwise no evidence of CAD.  Her anatomy is essentially unchanged from her prior cath performed by myself 2/20.  I believe her chest pain is noncardiac after Myoview false positive.  Sheath was removed and a TR band was placed on the right wrist to achieve patent hemostasis.  The patient left lab stable condition.  She can be discharged home later today. [name]. MD, [facility] 05/01/2020 1:27 PM   NM Myocar Multi W/Spect Tamela Oddi Motion / EF  Result Date: 04/30/2020 CLINICAL DATA:  Chest pain EXAM: MYOCARDIAL IMAGING WITH SPECT (REST AND EXERCISE) GATED LEFT VENTRICULAR WALL MOTION STUDY LEFT  VENTRICULAR EJECTION FRACTION TECHNIQUE: Standard myocardial SPECT imaging was performed after resting intravenous injection of 10 mCi Tc-60m tetrofosmin. Subsequently, exercise tolerance test was performed by the patient under the supervision of the Cardiology staff. At peak-stress, 32 mCi Tc-79m tetrofosminwas injected intravenously and standard myocardial SPECT imaging was performed. Quantitative gated imaging was also performed to evaluate left ventricular wall motion, and estimate left ventricular ejection fraction. COMPARISON:  None. FINDINGS: Perfusion: Subtle area of decreased activity noted in the anterior apical wall on stress images, normal on rest images. No other reversible areas. Wall Motion: Normal left ventricular wall motion. No left ventricular dilation. Left Ventricular Ejection Fraction: 65 % End diastolic volume 48 ml End systolic volume 17 ml IMPRESSION: 1. Subtle area of reversibility in the anteroapical wall concerning for ischemia. 2. Normal left ventricular wall motion. 3. Left ventricular ejection fraction 65% 4. Non invasive risk stratification*: Intermediate *2012 Appropriate Use Criteria for Coronary Revascularization Focused Update: J Am Coll Cardiol. 2841;32(4):401-027. http://content.airportbarriers.com.aspx?articleid=[email or web address] Electronically Signed   By: Rolm Baptise M.D.   On: 04/30/2020 22:25   DG Chest Portable 1 View  Result Date: 04/29/2020 CLINICAL DATA:  Chest pain. EXAM: PORTABLE CHEST 1 VIEW COMPARISON:  February 14, 2018 FINDINGS: The heart size and mediastinal contours are within normal limits. Right central venous line is identified with distal tip in the superior vena cava. Both lungs are clear. The visualized skeletal structures are unremarkable. IMPRESSION: No active disease. Electronically Signed   By: Abelardo Diesel M.D.   On: 04/29/2020 15:51     Subjective: No acute issues or events overnight, patient complaining of left breast wound but otherwise denies  nausea vomiting diaphoresis constipation headache fevers or chills.   Discharge Exam: Vitals:   05/01/20 1515 05/01/20 1530  BP: (!) 145/67 133/73  Pulse: 70 65  Resp: 16 19  Temp:    SpO2: 96% 94%   Vitals:   05/01/20 1445 05/01/20 1500 05/01/20 1515 05/01/20 1530  BP: (!) 147/62 (!) 142/65 (!) 145/67 133/73  Pulse: 71 67 70 65  Resp: 18 15 16 19   Temp:      TempSrc:      SpO2: 91% 95% 96% 94%  Weight:      Height:        General: Pt is alert, awake, not in acute distress Cardiovascular: RRR, S1/S2 +, no rubs, no gallops Respiratory: CTA bilaterally, no wheezing, no rhonchi Abdominal: Soft, NT, ND, bowel sounds + Extremities: no edema, no cyanosis    The results of significant diagnostics from this hospitalization (including imaging, microbiology, ancillary and laboratory) are listed below for reference.  Microbiology: Recent Results (from the past 240 hour(s))  Resp Panel by RT-PCR (Flu A&B, Covid) Nasopharyngeal Swab     Status: None   Collection Time: 04/29/20  7:17 PM   Specimen: Nasopharyngeal Swab; Nasopharyngeal(NP) swabs in vial transport medium  Result Value Ref Range Status   SARS Coronavirus 2 by RT PCR NEGATIVE NEGATIVE Final    Comment: (NOTE) SARS-CoV-2 target nucleic acids are NOT DETECTED.  The SARS-CoV-2 RNA is generally detectable in upper respiratory specimens during the acute phase of infection. The lowest concentration of SARS-CoV-2 viral copies this assay can detect is 138 copies/mL. A negative result does not preclude SARS-Cov-2 infection and should not be used as the sole basis for treatment or other patient management decisions. A negative result may occur with  improper specimen collection/handling, submission of specimen other than nasopharyngeal swab, presence of viral mutation(s) within the areas targeted by this assay, and inadequate number of viral copies(<138 copies/mL). A negative result must be combined with clinical  observations, patient history, and epidemiological information. The expected result is Negative.  Fact Sheet for Patients:  EntrepreneurPulse.com.au  Fact Sheet for Healthcare Providers:  IncredibleEmployment.be  This test is no t yet approved or cleared by the [place] FDA and  has been authorized for detection and/or diagnosis of SARS-CoV-2 by FDA under an Emergency Use Authorization (EUA). This EUA will remain  in effect (meaning this test can be used) for the duration of the COVID-19 declaration under Section 564(b)(1) of the Act, 21 U.S.C.section 360bbb-3(b)(1), unless the authorization is terminated  or revoked sooner.       Influenza A by PCR NEGATIVE NEGATIVE Final   Influenza B by PCR NEGATIVE NEGATIVE Final    Comment: (NOTE) The Xpert Xpress SARS-CoV-2/FLU/RSV plus assay is intended as an aid in the diagnosis of influenza from Nasopharyngeal swab specimens and should not be used as a sole basis for treatment. Nasal washings and aspirates are unacceptable for Xpert Xpress SARS-CoV-2/FLU/RSV testing.  Fact Sheet for Patients: EntrepreneurPulse.com.au  Fact Sheet for Healthcare Providers: IncredibleEmployment.be  This test is not yet approved or cleared by the [place] FDA and has been authorized for detection and/or diagnosis of SARS-CoV-2 by FDA under an Emergency Use Authorization (EUA). This EUA will remain in effect (meaning this test can be used) for the duration of the COVID-19 declaration under Section 564(b)(1) of the Act, 21 U.S.C. section 360bbb-3(b)(1), unless the authorization is terminated or revoked.  Performed at [facility], [place] [address], [place], [place] [address]      Labs: BNP (last 3 results) No results for input(s): BNP in the last 8760 hours. Basic Metabolic Panel: Recent Labs  Lab 04/29/20 1632 04/30/20 0351 05/01/20 0347  NA 137  140 136  K 4.3 3.1* 3.8  CL 102 103 102  CO2 24 26 22   GLUCOSE 158* 93 134*  BUN 10 11 15   CREATININE 0.60 0.50 0.46  CALCIUM 10.2 9.8 9.6  MG  --   --  2.0   Liver Function Tests: No results for input(s): AST, ALT, ALKPHOS, BILITOT, PROT, ALBUMIN in the last 168 hours. No results for input(s): LIPASE, AMYLASE in the last 168 hours. No results for input(s): AMMONIA in the last 168 hours. CBC: Recent Labs  Lab 04/29/20 1632 04/30/20 0351 05/01/20 0347  WBC 6.7 5.9 5.4  HGB 14.8 14.1 13.7  HCT 44.9 43.8 42.5  MCV 99.3 99.5 100.5*  PLT 159 148* 131*   Cardiac Enzymes: No results for  input(s): CKTOTAL, CKMB, CKMBINDEX, TROPONINI in the last 168 hours. BNP: Invalid input(s): POCBNP CBG: Recent Labs  Lab 04/30/20 0746 04/30/20 1548 04/30/20 2033 05/01/20 0800 05/01/20 1112  GLUCAP 132* 114* 207* 150* 144*   D-Dimer No results for input(s): DDIMER in the last 72 hours. Hgb A1c Recent Labs    04/30/20 0351  HGBA1C 8.5*   Lipid Profile No results for input(s): CHOL, HDL, LDLCALC, TRIG, CHOLHDL, LDLDIRECT in the last 72 hours. Thyroid function studies Recent Labs    05/01/20 0347  TSH 12.574*   Anemia work up No results for input(s): VITAMINB12, FOLATE, FERRITIN, TIBC, IRON, RETICCTPCT in the last 72 hours. Urinalysis No results found for: COLORURINE, APPEARANCEUR, [place], [place], GLUCOSEU, [place], [place], [place], PROTEINUR, UROBILINOGEN, NITRITE, LEUKOCYTESUR Sepsis Labs Invalid input(s): PROCALCITONIN,  WBC,  LACTICIDVEN Microbiology Recent Results (from the past 240 hour(s))  Resp Panel by RT-PCR (Flu A&B, Covid) Nasopharyngeal Swab     Status: None   Collection Time: 04/29/20  7:17 PM   Specimen: Nasopharyngeal Swab; Nasopharyngeal(NP) swabs in vial transport medium  Result Value Ref Range Status   SARS Coronavirus 2 by RT PCR NEGATIVE NEGATIVE Final    Comment: (NOTE) SARS-CoV-2 target nucleic acids are NOT DETECTED.  The SARS-CoV-2 RNA is  generally detectable in upper respiratory specimens during the acute phase of infection. The lowest concentration of SARS-CoV-2 viral copies this assay can detect is 138 copies/mL. A negative result does not preclude SARS-Cov-2 infection and should not be used as the sole basis for treatment or other patient management decisions. A negative result may occur with  improper specimen collection/handling, submission of specimen other than nasopharyngeal swab, presence of viral mutation(s) within the areas targeted by this assay, and inadequate number of viral copies(<138 copies/mL). A negative result must be combined with clinical observations, patient history, and epidemiological information. The expected result is Negative.  Fact Sheet for Patients:  EntrepreneurPulse.com.au  Fact Sheet for Healthcare Providers:  IncredibleEmployment.be  This test is no t yet approved or cleared by the [place] FDA and  has been authorized for detection and/or diagnosis of SARS-CoV-2 by FDA under an Emergency Use Authorization (EUA). This EUA will remain  in effect (meaning this test can be used) for the duration of the COVID-19 declaration under Section 564(b)(1) of the Act, 21 U.S.C.section 360bbb-3(b)(1), unless the authorization is terminated  or revoked sooner.       Influenza A by PCR NEGATIVE NEGATIVE Final   Influenza B by PCR NEGATIVE NEGATIVE Final    Comment: (NOTE) The Xpert Xpress SARS-CoV-2/FLU/RSV plus assay is intended as an aid in the diagnosis of influenza from Nasopharyngeal swab specimens and should not be used as a sole basis for treatment. Nasal washings and aspirates are unacceptable for Xpert Xpress SARS-CoV-2/FLU/RSV testing.  Fact Sheet for Patients: EntrepreneurPulse.com.au  Fact Sheet for Healthcare Providers: IncredibleEmployment.be  This test is not yet approved or cleared by the [place] FDA and has been authorized for detection and/or diagnosis of SARS-CoV-2 by FDA under an Emergency Use Authorization (EUA). This EUA will remain in effect (meaning this test can be used) for the duration of the COVID-19 declaration under Section 564(b)(1) of the Act, 21 U.S.C. section 360bbb-3(b)(1), unless the authorization is terminated or revoked.  Performed at [facility], [place] [address], [place], [place] [address]      Time coordinating discharge: Over 30 minutes  SIGNED:   Little Ishikawa, DO [facility] 05/01/2020, 3:53 PM Pager  If 7PM-7AM, please contact night-coverage [email or web address]

## 2020-05-01 NOTE — Consult Note (Signed)
WOC Nurse Consult Note: Reason for Consult: radiation skin irritation, left breast and left axilla Wound type: partial thickness skin loss inframammary and left axilla  Pressure Injury POA: NA Measurement: liner areas under the breast, extends length of skin fold Wound EZV:GJFTN, pink Drainage (amount, consistency, odor) serous; no drainage noted under the axilla Periwound: breast is reddened  Dressing procedure/placement/frequency:  trying silicone foam under the breast and axilla May need to switch back to vaseline gauze if patient reports this helps with pain more. Order interdry for use at home for long term management of skin irritation .  Stop nystatin for now.   Discussed POC with patient and bedside nurse.  Re consult if needed, will not follow at this time. Thanks  Aaryana Betke [employer], RN,CWOCN, CNS, [place] [phone])

## 2020-05-01 NOTE — Interval H&P Note (Signed)
Cath Lab Visit (complete for each Cath Lab visit)  Clinical Evaluation Leading to the Procedure:   ACS: No.  Non-ACS:    Anginal Classification: CCS II  Anti-ischemic medical therapy: Minimal Therapy (1 class of medications)  Non-Invasive Test Results: Intermediate-risk stress test findings: cardiac mortality 1-3%/year  Prior CABG: No previous CABG      History and Physical Interval Note:  05/01/2020 12:52 PM  Felicia [name]  has presented today for surgery, with the diagnosis of unstable angina.  The various methods of treatment have been discussed with the patient and family. After consideration of risks, benefits and other options for treatment, the patient has consented to  Procedure(s): LEFT HEART CATH AND CORONARY ANGIOGRAPHY (N/A) as a surgical intervention.  The patient's history has been reviewed, patient examined, no change in status, stable for surgery.  I have reviewed the patient's chart and labs.  Questions were answered to the patient's satisfaction.     [name]

## 2020-05-02 ENCOUNTER — Ambulatory Visit
Admission: RE | Admit: 2020-05-02 | Discharge: 2020-05-02 | Disposition: A | Discharge status: Home / Self Care | Payer: Medicare Other | Source: Ambulatory Visit | Attending: Radiation Oncology | Admitting: Radiation Oncology

## 2020-05-02 ENCOUNTER — Other Ambulatory Visit: Payer: Self-pay

## 2020-05-02 ENCOUNTER — Encounter (HOSPITAL_COMMUNITY): Payer: Self-pay | Admitting: Cardiovascular Disease

## 2020-05-02 ENCOUNTER — Other Ambulatory Visit: Payer: Self-pay | Admitting: Radiation Oncology

## 2020-05-02 DIAGNOSIS — C50412 Malignant neoplasm of upper-outer quadrant of left female breast: Secondary | ICD-10-CM | POA: Diagnosis not present

## 2020-05-02 DIAGNOSIS — Z170 Estrogen receptor positive status [ER+]: Secondary | ICD-10-CM | POA: Diagnosis not present

## 2020-05-02 MED ORDER — TRAMADOL HCL 50 MG PO TABS: 50.0000 mg | ORAL_TABLET | Freq: Four times a day (QID) | ORAL | 0 refills | Status: DC | PRN

## 2020-05-03 ENCOUNTER — Other Ambulatory Visit: Payer: Self-pay | Admitting: Nurse Practitioner

## 2020-05-03 ENCOUNTER — Ambulatory Visit
Admission: RE | Admit: 2020-05-03 | Discharge: 2020-05-03 | Disposition: A | Discharge status: Home / Self Care | Payer: Medicare Other | Source: Ambulatory Visit | Attending: Radiation Oncology | Admitting: Radiation Oncology

## 2020-05-03 DIAGNOSIS — C50412 Malignant neoplasm of upper-outer quadrant of left female breast: Secondary | ICD-10-CM

## 2020-05-03 DIAGNOSIS — M24811 Other specific joint derangements of right shoulder, not elsewhere classified: Secondary | ICD-10-CM | POA: Diagnosis not present

## 2020-05-03 DIAGNOSIS — M75121 Complete rotator cuff tear or rupture of right shoulder, not specified as traumatic: Secondary | ICD-10-CM | POA: Diagnosis not present

## 2020-05-03 DIAGNOSIS — Z170 Estrogen receptor positive status [ER+]: Secondary | ICD-10-CM | POA: Diagnosis not present

## 2020-05-04 ENCOUNTER — Other Ambulatory Visit: Payer: Self-pay

## 2020-05-04 ENCOUNTER — Ambulatory Visit
Admission: RE | Admit: 2020-05-04 | Discharge: 2020-05-04 | Disposition: A | Discharge status: Home / Self Care | Payer: Medicare Other | Source: Ambulatory Visit | Attending: Radiation Oncology | Admitting: Radiation Oncology

## 2020-05-04 ENCOUNTER — Telehealth: Payer: Self-pay

## 2020-05-04 ENCOUNTER — Ambulatory Visit: Payer: Medicare Other

## 2020-05-04 ENCOUNTER — Ambulatory Visit (HOSPITAL_COMMUNITY)
Admission: RE | Admit: 2020-05-04 | Discharge: 2020-05-04 | Disposition: A | Discharge status: Home / Self Care | Payer: Medicare Other | Source: Ambulatory Visit | Attending: Adult Health | Admitting: Adult Health

## 2020-05-04 DIAGNOSIS — Z170 Estrogen receptor positive status [ER+]: Secondary | ICD-10-CM | POA: Diagnosis not present

## 2020-05-04 DIAGNOSIS — C50412 Malignant neoplasm of upper-outer quadrant of left female breast: Secondary | ICD-10-CM | POA: Diagnosis not present

## 2020-05-04 NOTE — Telephone Encounter (Signed)
Received phone call from the echocardiogram department.  They are unable to perform echo at this time due to skin changes from radiation, patient unable to tolerate.   Pt is under current radiation.  RN spoke with nurse of Dr. Isidore Moos and they are aware of patient's condition and monitoring.    RN rescheduled echocardiogram for 2 weeks post radiation, which will be prior to next infusion as well.  Left voicemail for patient to call back regarding updated appointment.

## 2020-05-05 ENCOUNTER — Ambulatory Visit: Payer: Medicare Other

## 2020-05-05 ENCOUNTER — Ambulatory Visit
Admission: RE | Admit: 2020-05-05 | Discharge: 2020-05-05 | Disposition: A | Discharge status: Home / Self Care | Payer: Medicare Other | Source: Ambulatory Visit | Attending: Radiation Oncology | Admitting: Radiation Oncology

## 2020-05-05 DIAGNOSIS — C50412 Malignant neoplasm of upper-outer quadrant of left female breast: Secondary | ICD-10-CM | POA: Diagnosis not present

## 2020-05-05 DIAGNOSIS — Z170 Estrogen receptor positive status [ER+]: Secondary | ICD-10-CM | POA: Diagnosis not present

## 2020-05-08 ENCOUNTER — Other Ambulatory Visit: Payer: Self-pay

## 2020-05-08 ENCOUNTER — Ambulatory Visit
Admission: RE | Admit: 2020-05-08 | Discharge: 2020-05-08 | Disposition: A | Discharge status: Home / Self Care | Payer: Medicare Other | Source: Ambulatory Visit | Attending: Radiation Oncology | Admitting: Radiation Oncology

## 2020-05-08 ENCOUNTER — Ambulatory Visit: Payer: Medicare Other

## 2020-05-08 DIAGNOSIS — Z170 Estrogen receptor positive status [ER+]: Secondary | ICD-10-CM | POA: Diagnosis not present

## 2020-05-08 DIAGNOSIS — C50412 Malignant neoplasm of upper-outer quadrant of left female breast: Secondary | ICD-10-CM | POA: Diagnosis not present

## 2020-05-09 ENCOUNTER — Ambulatory Visit
Admission: RE | Admit: 2020-05-09 | Discharge: 2020-05-09 | Disposition: A | Discharge status: Home / Self Care | Payer: Medicare Other | Source: Ambulatory Visit | Attending: Radiation Oncology | Admitting: Radiation Oncology

## 2020-05-09 ENCOUNTER — Encounter: Payer: Self-pay | Admitting: *Deleted

## 2020-05-09 DIAGNOSIS — Z170 Estrogen receptor positive status [ER+]: Secondary | ICD-10-CM | POA: Diagnosis not present

## 2020-05-09 DIAGNOSIS — C50412 Malignant neoplasm of upper-outer quadrant of left female breast: Secondary | ICD-10-CM | POA: Diagnosis not present

## 2020-05-10 ENCOUNTER — Ambulatory Visit
Admission: RE | Admit: 2020-05-10 | Discharge: 2020-05-10 | Disposition: A | Discharge status: Home / Self Care | Payer: Medicare Other | Source: Ambulatory Visit | Attending: Radiation Oncology | Admitting: Radiation Oncology

## 2020-05-10 ENCOUNTER — Other Ambulatory Visit: Payer: Self-pay

## 2020-05-10 ENCOUNTER — Ambulatory Visit: Payer: Medicare Other

## 2020-05-10 ENCOUNTER — Inpatient Hospital Stay: Payer: Medicare Other

## 2020-05-10 ENCOUNTER — Other Ambulatory Visit: Payer: Self-pay | Admitting: Oncology

## 2020-05-10 ENCOUNTER — Other Ambulatory Visit: Payer: Medicare Other

## 2020-05-10 VITALS — BP 109/64 | HR 74 | Temp 97.6°F | Resp 18

## 2020-05-10 DIAGNOSIS — Z5112 Encounter for antineoplastic immunotherapy: Secondary | ICD-10-CM | POA: Diagnosis not present

## 2020-05-10 DIAGNOSIS — I6782 Cerebral ischemia: Secondary | ICD-10-CM | POA: Diagnosis not present

## 2020-05-10 DIAGNOSIS — Z833 Family history of diabetes mellitus: Secondary | ICD-10-CM | POA: Diagnosis not present

## 2020-05-10 DIAGNOSIS — Z8249 Family history of ischemic heart disease and other diseases of the circulatory system: Secondary | ICD-10-CM | POA: Diagnosis not present

## 2020-05-10 DIAGNOSIS — C50412 Malignant neoplasm of upper-outer quadrant of left female breast: Secondary | ICD-10-CM

## 2020-05-10 DIAGNOSIS — T451X5A Adverse effect of antineoplastic and immunosuppressive drugs, initial encounter: Secondary | ICD-10-CM | POA: Diagnosis not present

## 2020-05-10 DIAGNOSIS — G2581 Restless legs syndrome: Secondary | ICD-10-CM | POA: Diagnosis not present

## 2020-05-10 DIAGNOSIS — E109 Type 1 diabetes mellitus without complications: Secondary | ICD-10-CM | POA: Diagnosis not present

## 2020-05-10 DIAGNOSIS — Z803 Family history of malignant neoplasm of breast: Secondary | ICD-10-CM | POA: Diagnosis not present

## 2020-05-10 DIAGNOSIS — Z808 Family history of malignant neoplasm of other organs or systems: Secondary | ICD-10-CM | POA: Diagnosis not present

## 2020-05-10 DIAGNOSIS — M1711 Unilateral primary osteoarthritis, right knee: Secondary | ICD-10-CM | POA: Diagnosis not present

## 2020-05-10 DIAGNOSIS — Z79899 Other long term (current) drug therapy: Secondary | ICD-10-CM | POA: Diagnosis not present

## 2020-05-10 DIAGNOSIS — Z170 Estrogen receptor positive status [ER+]: Secondary | ICD-10-CM | POA: Diagnosis not present

## 2020-05-10 DIAGNOSIS — G620 Drug-induced polyneuropathy: Secondary | ICD-10-CM | POA: Diagnosis not present

## 2020-05-10 MED ORDER — LORAZEPAM 2 MG/ML IJ SOLN
INTRAMUSCULAR | Status: AC
  Filled 2020-05-10: qty 1

## 2020-05-10 MED ORDER — SODIUM CHLORIDE 0.9 % IV SOLN
Freq: Once | INTRAVENOUS | Status: AC
Start: 2020-05-10 — End: 2020-05-10
  Filled 2020-05-10: qty 250

## 2020-05-10 MED ORDER — DIPHENHYDRAMINE HCL 25 MG PO CAPS
ORAL_CAPSULE | ORAL | Status: AC
  Filled 2020-05-10: qty 1

## 2020-05-10 MED ORDER — DIPHENHYDRAMINE HCL 25 MG PO CAPS
25.0000 mg | ORAL_CAPSULE | Freq: Once | ORAL | Status: AC
Start: 2020-05-10 — End: 2020-05-10
  Administered 2020-05-10: 25 mg via ORAL

## 2020-05-10 MED ORDER — ACETAMINOPHEN 325 MG PO TABS
ORAL_TABLET | ORAL | Status: AC
  Filled 2020-05-10: qty 2

## 2020-05-10 MED ORDER — ACETAMINOPHEN 325 MG PO TABS
650.0000 mg | ORAL_TABLET | Freq: Once | ORAL | Status: AC
  Administered 2020-05-10: 650 mg via ORAL

## 2020-05-10 MED ORDER — HEPARIN SOD (PORK) LOCK FLUSH 100 UNIT/ML IV SOLN
500.0000 [IU] | Freq: Once | INTRAVENOUS | Status: AC | PRN
  Administered 2020-05-10: 500 [IU]
  Filled 2020-05-10: qty 5

## 2020-05-10 MED ORDER — SODIUM CHLORIDE 0.9 % IV SOLN
3.6000 mg/kg | Freq: Once | INTRAVENOUS | Status: AC
  Administered 2020-05-10: 280 mg via INTRAVENOUS
  Filled 2020-05-10: qty 14

## 2020-05-10 MED ORDER — SODIUM CHLORIDE 0.9% FLUSH
10.0000 mL | INTRAVENOUS | Status: DC | PRN
  Administered 2020-05-10: 10 mL
  Filled 2020-05-10: qty 10

## 2020-05-10 MED ORDER — LORAZEPAM 2 MG/ML IJ SOLN
0.5000 mg | Freq: Once | INTRAMUSCULAR | Status: AC
  Administered 2020-05-10: 0.5 mg via INTRAVENOUS

## 2020-05-10 NOTE — Patient Instructions (Signed)
[facility]  Discharge Instructions: °Thank you for choosing [facility] to provide your oncology and hematology care.  ° °If you have a lab appointment with the Cancer Center, please go directly to the Cancer Center and check in at the registration area. °  °Wear comfortable clothing and clothing appropriate for easy access to any Portacath or PICC line.  ° °We strive to give you quality time with your provider. You may need to reschedule your appointment if you arrive late (15 or more minutes).  Arriving late affects you and other patients whose appointments are after yours.  Also, if you miss three or more appointments without notifying the office, you may be dismissed from the clinic at the provider’s discretion.    °  °For prescription refill requests, have your pharmacy contact our office and allow 72 hours for refills to be completed.   ° °Today you received the following chemotherapy and/or immunotherapy agents Kadcyla    °  °To help prevent nausea and vomiting after your treatment, we encourage you to take your nausea medication as directed. ° °BELOW ARE SYMPTOMS THAT SHOULD BE REPORTED IMMEDIATELY: °*FEVER GREATER THAN 100.4 F (38 °C) OR HIGHER °*CHILLS OR SWEATING °*NAUSEA AND VOMITING THAT IS NOT CONTROLLED WITH YOUR NAUSEA MEDICATION °*UNUSUAL SHORTNESS OF BREATH °*UNUSUAL BRUISING OR BLEEDING °*URINARY PROBLEMS (pain or burning when urinating, or frequent urination) °*BOWEL PROBLEMS (unusual diarrhea, constipation, pain near the anus) °TENDERNESS IN MOUTH AND THROAT WITH OR WITHOUT PRESENCE OF ULCERS (sore throat, sores in mouth, or a toothache) °UNUSUAL RASH, SWELLING OR PAIN  °UNUSUAL VAGINAL DISCHARGE OR ITCHING  ° °Items with * indicate a potential emergency and should be followed up as soon as possible or go to the Emergency Department if any problems should occur. ° °Please show the CHEMOTHERAPY ALERT CARD or IMMUNOTHERAPY ALERT CARD at check-in to the  Emergency Department and triage nurse. ° °Should you have questions after your visit or need to cancel or reschedule your appointment, please contact [facility]  Dept: [phone]  and follow the prompts.  Office hours are 8:00 a.m. to 4:30 p.m. Monday - Friday. Please note that voicemails left after 4:00 p.m. may not be returned until the following business day.  We are closed weekends and major holidays. You have access to a nurse at all times for urgent questions. Please call the main number to the clinic Dept: [phone] and follow the prompts. ° ° °For any non-urgent questions, you may also contact your provider using MyChart. We now offer e-Visits for anyone 18 and older to request care online for non-urgent symptoms. For details visit [email or web address]. °  °Also download the MyChart app! Go to the app store, search "MyChart", open the app, select [facility], and log in with your MyChart username and password. ° °Due to Covid, a mask is required upon entering the hospital/clinic. If you do not have a mask, one will be given to you upon arrival. For doctor visits, patients may have 1 support person aged 18 or older with them. For treatment visits, patients cannot have anyone with them due to current Covid guidelines and our immunocompromised population.  ° °

## 2020-05-10 NOTE — Progress Notes (Unsigned)
Ok to use labs from 4/18 per Dr. Jana Hakim for treatment today

## 2020-05-10 NOTE — Progress Notes (Unsigned)
Dr Jana Hakim aware of pt's recent inpatient stay for unstable angina. Notes reviewed by MD.  Per Dr Jana Hakim pt ok for treatment today.

## 2020-05-11 ENCOUNTER — Ambulatory Visit
Admission: RE | Admit: 2020-05-11 | Discharge: 2020-05-11 | Disposition: A | Discharge status: Home / Self Care | Payer: Medicare Other | Source: Ambulatory Visit | Attending: Radiation Oncology | Admitting: Radiation Oncology

## 2020-05-11 ENCOUNTER — Ambulatory Visit: Payer: Medicare Other

## 2020-05-11 DIAGNOSIS — Z170 Estrogen receptor positive status [ER+]: Secondary | ICD-10-CM | POA: Diagnosis not present

## 2020-05-11 DIAGNOSIS — C50412 Malignant neoplasm of upper-outer quadrant of left female breast: Secondary | ICD-10-CM | POA: Diagnosis not present

## 2020-05-11 NOTE — Progress Notes (Signed)
[facility] Patient and [facility] Counseling Note  The patient reported having a hospital stay due to heart problems and described her wound on her breast from radiation. The counselor and patient discussed the counseling relationship and what the patient may aim for in the future. The patient and counselor discussed the current status of stressors, as well as the emotions surrounding the end of radiation tomorrow. The counselor provided active listening, reflections of feeling, and validation of the pt experience. The patient was oriented times three. Her mood was somewhat stressed and she had a congruent affect, with no signs of SI/HI/NSSI. The patient is finishing her radiation treatment. Thinking about bills (I.e "Can I pay all my bills/stay afloat?" has resulted in distress and worry for the patient. Combined with the stress and fatigue of chemo, patient had retreated from the world and was depressed. As cancer treatment lessens and patient engages in her life again, she will continue to feel better.   Gaylyn Rong Counseling Intern

## 2020-05-12 ENCOUNTER — Other Ambulatory Visit: Payer: Self-pay

## 2020-05-12 ENCOUNTER — Ambulatory Visit
Admission: RE | Admit: 2020-05-12 | Discharge: 2020-05-12 | Disposition: A | Discharge status: Home / Self Care | Payer: Medicare Other | Source: Ambulatory Visit | Attending: Radiation Oncology | Admitting: Radiation Oncology

## 2020-05-12 ENCOUNTER — Encounter: Payer: Self-pay | Admitting: Radiation Oncology

## 2020-05-12 DIAGNOSIS — Z170 Estrogen receptor positive status [ER+]: Secondary | ICD-10-CM | POA: Diagnosis not present

## 2020-05-12 DIAGNOSIS — C50412 Malignant neoplasm of upper-outer quadrant of left female breast: Secondary | ICD-10-CM | POA: Diagnosis not present

## 2020-05-16 ENCOUNTER — Telehealth: Payer: Self-pay | Admitting: *Deleted

## 2020-05-16 ENCOUNTER — Telehealth: Payer: Self-pay | Admitting: Cardiovascular Disease

## 2020-05-16 NOTE — Telephone Encounter (Signed)
Spoke with the patient who states that she has continued to have shortness of breath since leaving the hospital. She states that it comes on with exertion. She can't do normal activities without having to sit and rest. She states that it hurts her chest and ribs to breath. She states that she has also been through radiation which has worn her down. She finished on 4/29.  She originally presented to the hospital for shortness of breath and chest pain. Her stress test was abnormal and she was sent for a catheterization which came back normal. She was scheduled for an outpatient echocardiogram and FU with her PCP and cardiologist. I have moved her echocardiogram appointment up to tomorrow 5/4. She does have a FU appointment scheduled with Estella Husk on 6/01.  Patient is aware of ER precautions and use of nitroglycerin for chest pain in the meantime.

## 2020-05-16 NOTE — Telephone Encounter (Signed)
Pt c/o Shortness Of Breath: STAT if SOB developed within the last 24 hours or pt is noticeably SOB on the phone  1. Are you currently SOB (can you hear that pt is SOB on the phone)? No  2. How long have you been experiencing SOB? Since she got out the hospital  3. Are you SOB when sitting or when up moving around? Moving around  4. Are you currently experiencing any other symptoms? Get off balance

## 2020-05-16 NOTE — Telephone Encounter (Signed)
I have reviewed the notes She had a normal cath last week. Normal echo 3 months age  ( and a repeat echo rescueduled in a week or so )  I doubt her dyspnea is due to a cardiac issue She should see her primary MD or pulmonary  I would be happy to see her if she cannot find any answers after seeing her primary or pulmonary .

## 2020-05-16 NOTE — Telephone Encounter (Signed)
RETURNED PATIENT'S PHONE CALL REQUESTING TO SCHEDULE FU, LVM FOR A RETURN CALL

## 2020-05-17 ENCOUNTER — Other Ambulatory Visit: Payer: Self-pay

## 2020-05-17 ENCOUNTER — Ambulatory Visit (HOSPITAL_COMMUNITY): Payer: Medicare Other | Attending: Cardiology

## 2020-05-17 DIAGNOSIS — E785 Hyperlipidemia, unspecified: Secondary | ICD-10-CM | POA: Insufficient documentation

## 2020-05-17 DIAGNOSIS — Z01818 Encounter for other preprocedural examination: Secondary | ICD-10-CM | POA: Insufficient documentation

## 2020-05-17 DIAGNOSIS — Z170 Estrogen receptor positive status [ER+]: Secondary | ICD-10-CM | POA: Diagnosis not present

## 2020-05-17 DIAGNOSIS — E119 Type 2 diabetes mellitus without complications: Secondary | ICD-10-CM | POA: Diagnosis not present

## 2020-05-17 DIAGNOSIS — C50412 Malignant neoplasm of upper-outer quadrant of left female breast: Secondary | ICD-10-CM | POA: Insufficient documentation

## 2020-05-17 DIAGNOSIS — I2510 Atherosclerotic heart disease of native coronary artery without angina pectoris: Secondary | ICD-10-CM | POA: Insufficient documentation

## 2020-05-17 DIAGNOSIS — I10 Essential (primary) hypertension: Secondary | ICD-10-CM | POA: Insufficient documentation

## 2020-05-17 DIAGNOSIS — Z0189 Encounter for other specified special examinations: Secondary | ICD-10-CM | POA: Diagnosis not present

## 2020-05-17 DIAGNOSIS — E039 Hypothyroidism, unspecified: Secondary | ICD-10-CM | POA: Diagnosis not present

## 2020-05-17 LAB — ECHOCARDIOGRAM COMPLETE
Area-P 1/2: 3.6 cm2
S' Lateral: 3 cm

## 2020-05-22 ENCOUNTER — Telehealth: Payer: Self-pay | Admitting: Cardiovascular Disease

## 2020-05-22 NOTE — Telephone Encounter (Signed)
Patient calling for echo results 

## 2020-05-23 NOTE — Telephone Encounter (Signed)
I have reviewed the echo report He echo remains normal She had a cath several months ago which showed a patent stent At this point, the etiology of her dyspnea is still not clear.   (The echo results did not go to my inbasket )

## 2020-05-23 NOTE — Telephone Encounter (Signed)
RN spoke with patient regarding results of echo. Patient states she still experiences dyspnea as well as fatigue. Patient states she completed 30 days of radiation in April, and she can tell she "wears down quickly." Patient states she has a follow-up with her primary care doctor next week. Rn advised patient to contact the office with any questions or concerns. Patient has a follow-up appointment on 6/1 with Estella Husk.

## 2020-05-23 NOTE — Telephone Encounter (Signed)
Patient returning call.

## 2020-05-23 NOTE — Telephone Encounter (Signed)
Left message to call back  

## 2020-05-24 ENCOUNTER — Ambulatory Visit: Payer: Medicare Other | Admitting: Oncology

## 2020-05-24 DIAGNOSIS — M65332 Trigger finger, left middle finger: Secondary | ICD-10-CM | POA: Diagnosis not present

## 2020-05-29 ENCOUNTER — Ambulatory Visit: Payer: Medicare Other | Admitting: Registered"

## 2020-05-30 ENCOUNTER — Encounter (HOSPITAL_COMMUNITY): Payer: Self-pay

## 2020-05-30 ENCOUNTER — Other Ambulatory Visit: Payer: Self-pay

## 2020-05-30 ENCOUNTER — Other Ambulatory Visit (HOSPITAL_COMMUNITY): Payer: Medicare Other

## 2020-05-30 DIAGNOSIS — Z170 Estrogen receptor positive status [ER+]: Secondary | ICD-10-CM

## 2020-05-30 DIAGNOSIS — C50412 Malignant neoplasm of upper-outer quadrant of left female breast: Secondary | ICD-10-CM

## 2020-05-31 ENCOUNTER — Inpatient Hospital Stay: Payer: Medicare Other

## 2020-05-31 ENCOUNTER — Inpatient Hospital Stay: Payer: Medicare Other | Attending: Oncology

## 2020-05-31 ENCOUNTER — Other Ambulatory Visit: Payer: Medicare Other

## 2020-05-31 ENCOUNTER — Other Ambulatory Visit: Payer: Self-pay

## 2020-05-31 VITALS — BP 112/68 | HR 70 | Temp 98.0°F | Resp 18 | Wt 167.4 lb

## 2020-05-31 DIAGNOSIS — Z95828 Presence of other vascular implants and grafts: Secondary | ICD-10-CM

## 2020-05-31 DIAGNOSIS — Z5112 Encounter for antineoplastic immunotherapy: Secondary | ICD-10-CM | POA: Diagnosis not present

## 2020-05-31 DIAGNOSIS — C50412 Malignant neoplasm of upper-outer quadrant of left female breast: Secondary | ICD-10-CM

## 2020-05-31 DIAGNOSIS — Z170 Estrogen receptor positive status [ER+]: Secondary | ICD-10-CM | POA: Insufficient documentation

## 2020-05-31 LAB — CBC WITH DIFFERENTIAL (CANCER CENTER ONLY)
Abs Immature Granulocytes: 0.03 10*3/uL (ref 0.00–0.07)
Basophils Absolute: 0.1 10*3/uL (ref 0.0–0.1)
Basophils Relative: 1 %
Eosinophils Absolute: 0.2 10*3/uL (ref 0.0–0.5)
Eosinophils Relative: 2 %
HCT: 43.7 % (ref 36.0–46.0)
Hemoglobin: 14 g/dL (ref 12.0–15.0)
Immature Granulocytes: 0 %
Lymphocytes Relative: 51 %
Lymphs Abs: 4.6 10*3/uL — ABNORMAL HIGH (ref 0.7–4.0)
MCH: 31.3 pg (ref 26.0–34.0)
MCHC: 32 g/dL (ref 30.0–36.0)
MCV: 97.5 fL (ref 80.0–100.0)
Monocytes Absolute: 0.8 10*3/uL (ref 0.1–1.0)
Monocytes Relative: 8 %
Neutro Abs: 3.5 10*3/uL (ref 1.7–7.7)
Neutrophils Relative %: 38 %
Platelet Count: 182 10*3/uL (ref 150–400)
RBC: 4.48 MIL/uL (ref 3.87–5.11)
RDW: 15 % (ref 11.5–15.5)
WBC Count: 9.1 10*3/uL (ref 4.0–10.5)
nRBC: 0 % (ref 0.0–0.2)

## 2020-05-31 LAB — CMP (CANCER CENTER ONLY)
ALT: 34 U/L (ref 0–44)
AST: 36 U/L (ref 15–41)
Albumin: 3.8 g/dL (ref 3.5–5.0)
Alkaline Phosphatase: 62 U/L (ref 38–126)
Anion gap: 13 (ref 5–15)
BUN: 11 mg/dL (ref 8–23)
CO2: 26 mmol/L (ref 22–32)
Calcium: 10 mg/dL (ref 8.9–10.3)
Chloride: 102 mmol/L (ref 98–111)
Creatinine: 0.73 mg/dL (ref 0.44–1.00)
GFR, Estimated: >60 mL/min (ref >60)
Glucose, Bld: 221 mg/dL — ABNORMAL HIGH (ref 70–99)
Potassium: 4 mmol/L (ref 3.5–5.1)
Sodium: 141 mmol/L (ref 135–145)
Total Bilirubin: 0.4 mg/dL (ref 0.3–1.2)
Total Protein: 7.4 g/dL (ref 6.5–8.1)

## 2020-05-31 MED ORDER — HEPARIN SOD (PORK) LOCK FLUSH 100 UNIT/ML IV SOLN
500.0000 [IU] | Freq: Once | INTRAVENOUS | Status: DC | PRN
  Filled 2020-05-31: qty 5

## 2020-05-31 MED ORDER — SODIUM CHLORIDE 0.9% FLUSH
10.0000 mL | Freq: Once | INTRAVENOUS | Status: AC
  Administered 2020-05-31: 10 mL
  Filled 2020-05-31: qty 10

## 2020-05-31 MED ORDER — LORAZEPAM 2 MG/ML IJ SOLN
INTRAMUSCULAR | Status: AC
  Filled 2020-05-31: qty 1

## 2020-05-31 MED ORDER — SODIUM CHLORIDE 0.9% FLUSH
10.0000 mL | INTRAVENOUS | Status: DC | PRN
  Filled 2020-05-31: qty 10

## 2020-05-31 MED ORDER — ACETAMINOPHEN 325 MG PO TABS
650.0000 mg | ORAL_TABLET | Freq: Once | ORAL | Status: AC
  Administered 2020-05-31: 650 mg via ORAL

## 2020-05-31 MED ORDER — DIPHENHYDRAMINE HCL 25 MG PO CAPS
ORAL_CAPSULE | ORAL | Status: AC
  Filled 2020-05-31: qty 1

## 2020-05-31 MED ORDER — SODIUM CHLORIDE 0.9 % IV SOLN
Freq: Once | INTRAVENOUS | Status: AC
  Filled 2020-05-31: qty 250

## 2020-05-31 MED ORDER — DIPHENHYDRAMINE HCL 25 MG PO CAPS
25.0000 mg | ORAL_CAPSULE | Freq: Once | ORAL | Status: AC
  Administered 2020-05-31: 25 mg via ORAL

## 2020-05-31 MED ORDER — SODIUM CHLORIDE 0.9 % IV SOLN
3.6000 mg/kg | Freq: Once | INTRAVENOUS | Status: AC
  Administered 2020-05-31: 280 mg via INTRAVENOUS
  Filled 2020-05-31: qty 14

## 2020-05-31 MED ORDER — LORAZEPAM 2 MG/ML IJ SOLN
0.5000 mg | Freq: Once | INTRAMUSCULAR | Status: AC
Start: 2020-05-31 — End: 2020-05-31
  Administered 2020-05-31: 0.5 mg via INTRAVENOUS

## 2020-05-31 MED ORDER — ACETAMINOPHEN 325 MG PO TABS
ORAL_TABLET | ORAL | Status: AC
  Filled 2020-05-31: qty 2

## 2020-05-31 NOTE — Patient Instructions (Addendum)
[facility]  Discharge Instructions: Thank you for choosing [facility] to provide your oncology and hematology care.   If you have a lab appointment with the [facility], please go directly to the [facility] and check in at the registration area.   Wear comfortable clothing and clothing appropriate for easy access to any Portacath or PICC line.   We strive to give you quality time with your provider. You may need to reschedule your appointment if you arrive late (15 or more minutes).  Arriving late affects you and other patients whose appointments are after yours.  Also, if you miss three or more appointments without notifying the office, you may be dismissed from the clinic at the provider's discretion.      For prescription refill requests, have your pharmacy contact our office and allow 72 hours for refills to be completed.    Today you received the following chemotherapy and/or immunotherapy agents   Kadcyla  Ado-Trastuzumab Emtansine for injection What is this medicine? ADO-TRASTUZUMAB EMTANSINE (ADD oh traz TOO zuh mab em TAN zine) is a monoclonal antibody combined with chemotherapy. It is used to treat breast cancer. This medicine may be used for other purposes; ask your health care provider or pharmacist if you have questions. COMMON BRAND NAME(S): Kadcyla What should I tell my health care provider before I take this medicine? They need to know if you have any of these conditions:  heart disease  heart failure  infection (especially a virus infection such as chickenpox, cold sores, or herpes)  liver disease  lung or breathing disease, like asthma  tingling of the fingers or toes, or other nerve disorder  an unusual or allergic reaction to ado-trastuzumab emtansine, other medications, foods, dyes, or preservatives  pregnant or trying to get pregnant  breast-feeding How should I use this medicine? This medicine is for  infusion into a vein. It is given by a health care professional in a hospital or clinic setting. Talk to your pediatrician regarding the use of this medicine in children. Special care may be needed. Overdosage: If you think you have taken too much of this medicine contact a poison control center or emergency room at once. NOTE: This medicine is only for you. Do not share this medicine with others. What if I miss a dose? It is important not to miss your dose. Call your doctor or health care professional if you are unable to keep an appointment. What may interact with this medicine? This medicine may also interact with the following medications:  atazanavir  boceprevir  clarithromycin  delavirdine  indinavir  dalfopristin; quinupristin  isoniazid, INH  itraconazole  ketoconazole  nefazodone  nelfinavir  ritonavir  telaprevir  telithromycin  tipranavir  voriconazole This list may not describe all possible interactions. Give your health care provider a list of all the medicines, herbs, non-prescription drugs, or dietary supplements you use. Also tell them if you smoke, drink alcohol, or use illegal drugs. Some items may interact with your medicine. What should I watch for while using this medicine? Visit your doctor for checks on your progress. This drug may make you feel generally unwell. This is not uncommon, as chemotherapy can affect healthy cells as well as cancer cells. Report any side effects. Continue your course of treatment even though you feel ill unless your doctor tells you to stop. You may need blood work done while you are taking this medicine. Call your doctor or health care  professional for advice if you get a fever, chills or sore throat, or other symptoms of a cold or flu. Do not treat yourself. This drug decreases your body's ability to fight infections. Try to avoid being around people who are sick. Be careful brushing and flossing your teeth or using a  toothpick because you may get an infection or bleed more easily. If you have any dental work done, tell your dentist you are receiving this medicine. Avoid taking products that contain aspirin, acetaminophen, ibuprofen, naproxen, or ketoprofen unless instructed by your doctor. These medicines may hide a fever. Do not become pregnant while taking this medicine or for 7 months after stopping it, men with female partners should use contraception during treatment and for 4 months after the last dose. Women should inform their doctor if they wish to become pregnant or think they might be pregnant. There is a potential for serious side effects to an unborn child. Do not breast-feed an infant while taking this medicine or for 7 months after the last dose. Men who have a partner who is pregnant or who is capable of becoming pregnant should use a condom during sexual activity while taking this medicine and for 4 months after stopping it. Men should inform their doctors if they wish to father a child. This medicine may lower sperm counts. Talk to your health care professional or pharmacist for more information. What side effects may I notice from receiving this medicine? Side effects that you should report to your doctor or health care professional as soon as possible:  allergic reactions like skin rash, itching or hives, swelling of the face, lips, or tongue  breathing problems  chest pain or palpitations  fever or chills, sore throat  general ill feeling or flu-like symptoms  light-colored stools  nausea, vomiting  pain, tingling, numbness in the hands or feet  signs and symptoms of bleeding such as bloody or black, tarry stools; red or dark-brown urine; spitting up blood or brown material that looks like coffee grounds; red spots on the skin; unusual bruising or bleeding from the eye, gums, or nose  swelling of the legs or ankles  yellowing of the eyes or skin Side effects that usually do not  require medical attention (report to your doctor or health care professional if they continue or are bothersome):  changes in taste  constipation  dizziness  headache  joint pain  muscle pain  trouble sleeping  unusually weak or tired This list may not describe all possible side effects. Call your doctor for medical advice about side effects. You may report side effects to FDA at 1-800-FDA-1088. Where should I keep my medicine? This drug is given in a hospital or clinic and will not be stored at home. NOTE: This sheet is a summary. It may not cover all possible information. If you have questions about this medicine, talk to your doctor, pharmacist, or health care provider.  2021 Elsevier/Gold Standard (2017-05-30 10:03:15)   To help prevent nausea and vomiting after your treatment, we encourage you to take your nausea medication as directed.  BELOW ARE SYMPTOMS THAT SHOULD BE REPORTED IMMEDIATELY: . *FEVER GREATER THAN 100.4 F (38 C) OR HIGHER . *CHILLS OR SWEATING . *NAUSEA AND VOMITING THAT IS NOT CONTROLLED WITH YOUR NAUSEA MEDICATION . *UNUSUAL SHORTNESS OF BREATH . *UNUSUAL BRUISING OR BLEEDING . *URINARY PROBLEMS (pain or burning when urinating, or frequent urination) . *BOWEL PROBLEMS (unusual diarrhea, constipation, pain near the anus) . TENDERNESS IN MOUTH  AND THROAT WITH OR WITHOUT PRESENCE OF ULCERS (sore throat, sores in mouth, or a toothache) . UNUSUAL RASH, SWELLING OR PAIN  . UNUSUAL VAGINAL DISCHARGE OR ITCHING   Items with * indicate a potential emergency and should be followed up as soon as possible or go to the Emergency Department if any problems should occur.  Please show the CHEMOTHERAPY ALERT CARD or IMMUNOTHERAPY ALERT CARD at check-in to the Emergency Department and triage nurse.  Should you have questions after your visit or need to cancel or reschedule your appointment, please contact [facility]  Dept: [phone]   and follow the prompts.  Office hours are 8:00 a.m. to 4:30 p.m. Monday - Friday. Please note that voicemails left after 4:00 p.m. may not be returned until the following business day.  We are closed weekends and major holidays. You have access to a nurse at all times for urgent questions. Please call the main number to the clinic Dept: [phone] and follow the prompts.   For any non-urgent questions, you may also contact your provider using MyChart. We now offer e-Visits for anyone 93 and older to request care online for non-urgent symptoms. For details visit [email or web address].   Also download the MyChart app! Go to the app store, search "MyChart", open the app, select [facility], and log in with your MyChart username and password.  Due to Covid, a mask is required upon entering the hospital/clinic. If you do not have a mask, one will be given to you upon arrival. For doctor visits, patients may have 1 support person aged 53 or older with them. For treatment visits, patients cannot have anyone with them due to current Covid guidelines and our immunocompromised population.

## 2020-06-01 DIAGNOSIS — G2581 Restless legs syndrome: Secondary | ICD-10-CM | POA: Diagnosis not present

## 2020-06-01 DIAGNOSIS — R079 Chest pain, unspecified: Secondary | ICD-10-CM | POA: Diagnosis not present

## 2020-06-01 DIAGNOSIS — K219 Gastro-esophageal reflux disease without esophagitis: Secondary | ICD-10-CM | POA: Diagnosis not present

## 2020-06-01 DIAGNOSIS — E039 Hypothyroidism, unspecified: Secondary | ICD-10-CM | POA: Diagnosis not present

## 2020-06-07 NOTE — Progress Notes (Signed)
Cardiology Office Note    Date:  06/14/2020   ID:  [name], DOB [birth year], MRN [id number]   PCP:  Kathyrn Lass, [place]  Cardiologist:  Mertie Moores, MD  Advanced Practice Provider:  No care team member to display Electrophysiologist:  None   [phone]   Chief Complaint  Patient presents with  . Follow-up    History of Present Illness:  Felicia [name] is a 70 y.o. female with a hx of coronary artery disease DES mLAD 03/2015, HLD, diabetes mellitus, and hypothyroidism.  Admitted with chest pain 04/2020 while undergoing chemo and radiation for breast CA. Cath showed patent stent and no other disease. Echo with normal LVEF 60-65%. CTA no definitive PE emphysema.  Patient comes in for f/u. Has constant chest pressure and hurts to take a deep breath. Thinks something is wrong with her lungs. Exposed to second hand smoke for many years. Thinks it's coming from radiation. Going every 3 weeks for chemo.    Past Medical History:  Diagnosis Date  . Anemia   . Anxiety   . Breast cancer ([place]) 09/2019   left breast IMC  . CAD (coronary artery disease)    a. 3/2007s/p DES to the LAD [facility]); b. 01/2017 MV: EF 80%, small, mild apical ant defect w/o ischemia (felt to be breast atten). Low risk.  . Depression   . DM (diabetes mellitus) ([place])   . Essential hypertension   . GERD (gastroesophageal reflux disease)   . Headache    secondary to neck surgery per patient  . High triglycerides   . Hyperlipidemia   . Hypothyroid   . OA (osteoarthritis)    right knee,hands  . Overflow incontinence   . PONV (postoperative nausea and vomiting)    s/p gallbladder   . RLS (restless legs syndrome)   . Urinary urgency   . Uterine cancer ([place]) dx'd 2014    Past Surgical History:  Procedure Laterality Date  . BREAST LUMPECTOMY WITH RADIOACTIVE SEED AND SENTINEL LYMPH NODE BIOPSY Left 10/07/2019   Procedure: LEFT BREAST LUMPECTOMY WITH RADIOACTIVE  SEED AND SENTINEL LYMPH NODE BIOPSY;  Surgeon: Rolm Bookbinder, MD;  Location: [place];  Service: General;  Laterality: Left;  . CORONARY STENT PLACEMENT    . CORONARY/GRAFT ACUTE MI REVASCULARIZATION    . FINGER ARTHRODESIS Right 06/11/2019   Procedure: ARTHRODESIS INDEX FINGER DISTAL PHALANGEAL JOINT;  Surgeon: [name], MD;  Location: [place];  Service: Orthopedics;  Laterality: Right;  . FOOT SURGERY Left   . LEFT HEART CATH AND CORONARY ANGIOGRAPHY N/A 02/16/2018   Procedure: LEFT HEART CATH AND CORONARY ANGIOGRAPHY;  Surgeon: Lorretta Harp, MD;  Location: [place] [facility];  Service: Cardiovascular;  Laterality: N/A;  . LEFT HEART CATH AND CORONARY ANGIOGRAPHY N/A 05/01/2020   Procedure: LEFT HEART CATH AND CORONARY ANGIOGRAPHY;  Surgeon: Lorretta Harp, MD;  Location: [place] [facility];  Service: Cardiovascular;  Laterality: N/A;  . PORTACATH PLACEMENT Right 11/23/2019   Procedure: INSERTION PORT-A-CATH WITH ULTRASOUND GUIDANCE;  Surgeon: Rolm Bookbinder, MD;  Location: [place];  Service: General;  Laterality: Right;  . REPLACEMENT TOTAL KNEE Right   . SPINE SURGERY    . TOTAL ABDOMINAL HYSTERECTOMY     FOR UTERUS CANCER  . TOTAL HIP ARTHROPLASTY Left     Current Medications: Current Meds  Medication Sig  . ACCU-CHEK GUIDE test strip 1 each by Other route as needed.  Marland Kitchen  Ascorbic Acid (VITAMIN C PO) Take 1 tablet by mouth daily.  Marland Kitchen aspirin EC 81 MG tablet Take 81 mg by mouth daily.  Marland Kitchen atorvastatin (LIPITOR) 40 MG tablet TAKE 1 TABLET BY MOUTH EVERY DAY  . bisoprolol (ZEBETA) 5 MG tablet TAKE 1 TABLET BY MOUTH EVERY DAY  . Calcium Citrate-Vitamin D (CALCIUM + D PO) Take 1 tablet by mouth daily with supper.  Marland Kitchen CINNAMON PO Take 1 capsule by mouth daily with supper.  . diclofenac sodium (VOLTAREN) 1 % GEL Apply 2-4 g topically 4 (four) times daily as needed (as directed for pain).  . Dulaglutide (TRULICITY) 1.5  HY/8.6VH SOPN Inject 1.5 mg into the skin once a week.  . escitalopram (LEXAPRO) 10 MG tablet Take 10 mg by mouth at bedtime.  . Ferrous Sulfate (IRON PO) Take 1 tablet by mouth daily. alternates days:1 tablet one day and 2 tablets the next day  . gabapentin (NEURONTIN) 300 MG capsule Take 1 capsule (300 mg total) by mouth at bedtime.  Marland Kitchen GLIPIZIDE XL 10 MG 24 hr tablet Take 20 mg by mouth daily.   Marland Kitchen JARDIANCE 25 MG TABS tablet Take 25 mg by mouth every morning.   Marland Kitchen levothyroxine (SYNTHROID) 137 MCG tablet Take 137 mcg by mouth every morning.  . metFORMIN (GLUCOPHAGE) 500 MG tablet Take 500 mg by mouth 2 (two) times daily with a meal.  . metroNIDAZOLE (METROGEL) 1 % gel Apply 1 application topically as needed (as directed to affected area).  . Multiple Vitamins-Calcium (ONE-A-DAY WOMENS PO) Take 1 tablet by mouth daily.  . nitroGLYCERIN (NITROSTAT) 0.4 MG SL tablet Place 1 tablet (0.4 mg total) under the tongue every 5 (five) minutes as needed for chest pain.  Marland Kitchen omeprazole (PRILOSEC) 40 MG capsule Take 40 mg by mouth daily with supper.   . prochlorperazine (COMPAZINE) 10 MG tablet TAKE 1 TABLET BY MOUTH BEFORE MEALS AND AT BEDTIME ON DAYS 2 AND 3 AFTER CHEMOTHERAPY (COUNTING CHEMOTHERAPY DAY AS DAY 1) AFTER THAT MAY TAKE AS NEEDED  . rOPINIRole (REQUIP) 0.5 MG tablet Take 0.5 mg by mouth at bedtime. 1-3 hours prior to bedtime  . tiZANidine (ZANAFLEX) 4 MG tablet Take 4 mg by mouth every 8 (eight) hours as needed for muscle spasms. As needed  . traMADol (ULTRAM) 50 MG tablet Take 1 tablet (50 mg total) by mouth every 6 (six) hours as needed for moderate pain.  . TURMERIC PO Take 1 capsule by mouth daily with supper.  . [DISCONTINUED] levothyroxine (SYNTHROID, LEVOTHROID) 125 MCG tablet Take 125 mcg by mouth daily before breakfast.     Allergies:   Chloraprep one step [chlorhexidine gluconate], Estrogens, and Shrimp [shellfish allergy]   Social History   Socioeconomic History  . Marital status:  Divorced    Spouse name: Not on file  . Number of children: Not on file  . Years of education: Not on file  . Highest education level: Not on file  Occupational History  . Not on file  Tobacco Use  . Smoking status: Never Smoker  . Smokeless tobacco: Never Used  Vaping Use  . Vaping Use: Never used  Substance and Sexual Activity  . Alcohol use: Yes    Comment: OCCASIONALLY  . Drug use: No  . Sexual activity: Not Currently    Birth control/protection: Surgical    Comment: hyst  Other Topics Concern  . Not on file  Social History Narrative  . Not on file   Social Determinants of Health  Financial Resource Strain: High Risk  . Difficulty of Paying Living Expenses: Very hard  Food Insecurity: Food Insecurity Present  . Worried About Charity fundraiser in the Last Year: Sometimes true  . Ran Out of Food in the Last Year: Sometimes true  Transportation Needs: No Transportation Needs  . Lack of Transportation (Medical): No  . Lack of Transportation (Non-Medical): No  Physical Activity: Not on file  Stress: Not on file  Social Connections: Not on file     Family History:  The patient's family history includes AAA (abdominal aortic aneurysm) in her mother; Alzheimer's disease in her father; Breast cancer in her cousin; Diabetes in her father; Throat cancer in her sister.   ROS:   Please see the history of present illness.    ROS All other systems reviewed and are negative.   PHYSICAL EXAM:   VS:  BP 122/80   Pulse 90   Ht 5\' 4"  (1.626 m)   Wt 163 lb (73.9 kg)   SpO2 94%   BMI 27.98 kg/m   Physical Exam  GEN: Well nourished, well developed, in no acute distress  Neck: no JVD, carotid bruits, or masses Cardiac:RRR; no murmurs, rubs, or gallops  Respiratory:  clear to auscultation bilaterally, normal work of breathing GI: soft, nontender, nondistended, + BS Ext: without cyanosis, clubbing, or edema, Good distal pulses bilaterally Neuro:  Alert and Oriented x  3 Psych: euthymic mood, full affect  Wt Readings from Last 3 Encounters:  06/14/20 163 lb (73.9 kg)  05/31/20 167 lb 6.4 oz (75.9 kg)  04/29/20 170 lb 9.6 oz (77.4 kg)      Studies/Labs Reviewed:   EKG:  EKG is not ordered today.    Recent Labs: 05/01/2020: Magnesium 2.0; TSH 12.574 05/31/2020: ALT 34; BUN 11; Creatinine 0.73; Hemoglobin 14.0; Platelet Count 182; Potassium 4.0; Sodium 141   Lipid Panel    Component Value Date/Time   CHOL 152 09/27/2019 1016   TRIG 277 (H) 09/27/2019 1016   HDL 52 09/27/2019 1016   CHOLHDL 2.9 09/27/2019 1016   LDLCALC 57 09/27/2019 1016    Additional studies/ records that were reviewed today include:   CTA 04/29/20 IMPRESSION: No definitive pulmonary emboli although the lower lobe branches are somewhat limited due to timing of the contrast bolus and mild motion artifact.   Dilatation of the ascending aorta 3.7 cm. No specific follow-up is recommended at this time.   Bibasilar atelectasis.   Aortic Atherosclerosis (ICD10-I70.0) and Emphysema (ICD10-J43.9).     Electronically Signed   By: Inez Catalina M.D.   On: 04/29/2020 18:50  Cath 05/01/20  Previously placed Mid LAD stent (unknown type) is widely patent.  The left ventricular systolic function is normal.  LV end diastolic pressure is normal.  The left ventricular ejection fraction is 55-65% by visual estimate.   Echo 05/17/2020 IMPRESSIONS     1. Left ventricular ejection fraction, by estimation, is 60 to 65%.  Global longitudinal change is -21%. The left ventricle has normal  function. The left ventricle has no regional wall motion abnormalities.  Left ventricular diastolic parameters are  indeterminate.   2. Right ventricular systolic function is normal. The right ventricular  size is normal.   3. Trivial mitral valve regurgitation.   4. The aortic valve is tricuspid. Aortic valve regurgitation is trivial.  Mild aortic valve sclerosis is present, with no evidence of  aortic valve  stenosis.   5. The inferior vena cava is normal in size  with greater than 50%  respiratory variability, suggesting right atrial pressure of 3 mmHg.   Comparison(s): The left ventricular function is unchanged.   NST 04/30/20 IMPRESSION: 1. Subtle area of reversibility in the anteroapical wall concerning for ischemia.   2. Normal left ventricular wall motion.   3. Left ventricular ejection fraction 65%   4. Non invasive risk stratification*: Intermediate   *2012 Appropriate Use Criteria for Coronary Revascularization Focused Update: J Am Coll Cardiol. 9702;63(7):858-850. http://content.airportbarriers.com.aspx?articleid=1201161     Electronically Signed   By: Rolm Baptise M.D.   On: 04/30/2020 22:25      Scans on Order [id number]  Scan on 04/30/2020  2:02 PM by Default, Provider, MD    Scan on 04/30/2020  2:11 PM by Claud Kelp E, RT: nuc med lexiscan       Risk Assessment/Calculations:         ASSESSMENT:    1. Precordial pain   2. Coronary artery disease involving native coronary artery of native heart without angina pectoris   3. Shortness of breath   4. Mixed hyperlipidemia   5. Type 2 diabetes mellitus without complication, without long-term current use of insulin ([place])   6. Malignant neoplasm of upper-outer quadrant of left breast in female, estrogen receptor positive ([place])      PLAN:  In order of problems listed above:  Chest pain constant since radiation with cath showing patent LAD stent and no other obstruction, echo stable. CTA as above with no definitive PE and evidence of emphysema.  CAD prior mid LAD stent widely patent and no other obstructive disease on cath 04/2020 LVEF 55 to 60%  Shortness of breath and difficulty taking deep breaths since she has been on radiation treatment.  CTA in the hospital showed no definite pulmonary emboli although lower lobe branches somewhat limited due to timing of the contrast bolus and mild  motion.  There was evidence of emphysema.  She is asking to be referred to pulmonologist as she is exposed to secondhand smoke for many years.  Will place referral  Hyperlipidemia LDL 57 09/2019 on Lipitor  Diabetes mellitus patient has not been checking her sugars recently.  Encouraged her to keep track of these  Breast CA status post surgery and now undergoing chemo.  30 radiation treatments completed.  Shared Decision Making/Informed Consent        Medication Adjustments/Labs and Tests Ordered: Current medicines are reviewed at length with the patient today.  Concerns regarding medicines are outlined above.  Medication changes, Labs and Tests ordered today are listed in the Patient Instructions below. Patient Instructions  Medication Instructions:  Your physician recommends that you continue on your current medications as directed. Please refer to the Current Medication list given to you today.  *If you need a refill on your cardiac medications before your next appointment, please call your pharmacy*   Lab Work: none If you have labs (blood work) drawn today and your tests are completely normal, you will receive your results only by: Marland Kitchen MyChart Message (if you have MyChart) OR . A paper copy in the mail If you have any lab test that is abnormal or we need to change your treatment, we will call you to review the results.   Follow-Up: At [facility], you and your health needs are our priority.  As part of our continuing mission to provide you with exceptional heart care, we have created designated Provider Care Teams.  These Care Teams include your primary Cardiologist (physician) and  Advanced Practice Providers (APPs -  Physician Assistants and Nurse Practitioners) who all work together to provide you with the care you need, when you need it.  We recommend signing up for the patient portal called "MyChart".  Sign up information is provided on this After Visit Summary.  MyChart is  used to connect with patients for Virtual Visits (Telemedicine).  Patients are able to view lab/test results, encounter notes, upcoming appointments, etc.  Non-urgent messages can be sent to your provider as well.   To learn more about what you can do with MyChart, go to [email or web address].    Your next appointment:   6 month(s)  The format for your next appointment:   In Person  Provider:   You may see Mertie Moores, MD or one of the following Advanced Practice Providers on your designated Care Team:    Richardson Dopp, PA-C  [place], PA-C       Signed, Ermalinda Barrios, [place]  06/14/2020 12:23 PM    [address], [place], [place]  [address] Phone: [phone]; Fax: [phone]

## 2020-06-14 ENCOUNTER — Encounter: Payer: Self-pay | Admitting: Physician Assistant

## 2020-06-14 ENCOUNTER — Ambulatory Visit: Payer: Self-pay | Admitting: Radiation Oncology

## 2020-06-14 ENCOUNTER — Other Ambulatory Visit: Payer: Self-pay

## 2020-06-14 ENCOUNTER — Ambulatory Visit: Payer: Medicare Other | Admitting: Physician Assistant

## 2020-06-14 VITALS — BP 122/80 | HR 90 | Ht 64.0 in | Wt 163.0 lb

## 2020-06-14 DIAGNOSIS — E119 Type 2 diabetes mellitus without complications: Secondary | ICD-10-CM

## 2020-06-14 DIAGNOSIS — Z170 Estrogen receptor positive status [ER+]: Secondary | ICD-10-CM

## 2020-06-14 DIAGNOSIS — R0602 Shortness of breath: Secondary | ICD-10-CM | POA: Diagnosis not present

## 2020-06-14 DIAGNOSIS — I2510 Atherosclerotic heart disease of native coronary artery without angina pectoris: Secondary | ICD-10-CM

## 2020-06-14 DIAGNOSIS — C50412 Malignant neoplasm of upper-outer quadrant of left female breast: Secondary | ICD-10-CM

## 2020-06-14 DIAGNOSIS — R072 Precordial pain: Secondary | ICD-10-CM | POA: Diagnosis not present

## 2020-06-14 DIAGNOSIS — E782 Mixed hyperlipidemia: Secondary | ICD-10-CM | POA: Diagnosis not present

## 2020-06-14 NOTE — Patient Instructions (Signed)
Medication Instructions:  Your physician recommends that you continue on your current medications as directed. Please refer to the Current Medication list given to you today.  *If you need a refill on your cardiac medications before your next appointment, please call your pharmacy*   Lab Work: none If you have labs (blood work) drawn today and your tests are completely normal, you will receive your results only by: Marland Kitchen MyChart Message (if you have MyChart) OR . A paper copy in the mail If you have any lab test that is abnormal or we need to change your treatment, we will call you to review the results.   Follow-Up: At [facility], you and your health needs are our priority.  As part of our continuing mission to provide you with exceptional heart care, we have created designated Provider Care Teams.  These Care Teams include your primary Cardiologist (physician) and Advanced Practice Providers (APPs -  Physician Assistants and Nurse Practitioners) who all work together to provide you with the care you need, when you need it.  We recommend signing up for the patient portal called "MyChart".  Sign up information is provided on this After Visit Summary.  MyChart is used to connect with patients for Virtual Visits (Telemedicine).  Patients are able to view lab/test results, encounter notes, upcoming appointments, etc.  Non-urgent messages can be sent to your provider as well.   To learn more about what you can do with MyChart, go to [email or web address].    Your next appointment:   6 month(s)  The format for your next appointment:   In Person  Provider:   You may see Mertie Moores, MD or one of the following Advanced Practice Providers on your designated Care Team:    Richardson Dopp, PA-C  [place], [place]

## 2020-06-16 ENCOUNTER — Ambulatory Visit
Admission: RE | Admit: 2020-06-16 | Discharge: 2020-06-16 | Disposition: A | Discharge status: Home / Self Care | Payer: Medicare Other | Source: Ambulatory Visit | Attending: Radiation Oncology | Admitting: Radiation Oncology

## 2020-06-16 ENCOUNTER — Other Ambulatory Visit: Payer: Self-pay

## 2020-06-16 VITALS — BP 124/75 | HR 73 | Temp 97.0°F | Resp 20 | Wt 162.4 lb

## 2020-06-16 DIAGNOSIS — R0781 Pleurodynia: Secondary | ICD-10-CM | POA: Diagnosis not present

## 2020-06-16 DIAGNOSIS — Z923 Personal history of irradiation: Secondary | ICD-10-CM | POA: Diagnosis not present

## 2020-06-16 DIAGNOSIS — Z7984 Long term (current) use of oral hypoglycemic drugs: Secondary | ICD-10-CM | POA: Diagnosis not present

## 2020-06-16 DIAGNOSIS — C50412 Malignant neoplasm of upper-outer quadrant of left female breast: Secondary | ICD-10-CM

## 2020-06-16 DIAGNOSIS — Z170 Estrogen receptor positive status [ER+]: Secondary | ICD-10-CM | POA: Diagnosis not present

## 2020-06-16 DIAGNOSIS — J439 Emphysema, unspecified: Secondary | ICD-10-CM | POA: Insufficient documentation

## 2020-06-16 DIAGNOSIS — Z79899 Other long term (current) drug therapy: Secondary | ICD-10-CM | POA: Diagnosis not present

## 2020-06-16 DIAGNOSIS — R0602 Shortness of breath: Secondary | ICD-10-CM | POA: Insufficient documentation

## 2020-06-16 DIAGNOSIS — Z7982 Long term (current) use of aspirin: Secondary | ICD-10-CM | POA: Diagnosis not present

## 2020-06-16 NOTE — Progress Notes (Signed)
Felicia [name] presents today for follow-up after completing radiation to her left breast on 05/12/2020  Pain: Continues to deal with chest and left side/rib pain. Also reports her breast has some residual sensitivity.  Skin: Reports moist desquamation and weeping have resolved. Treatment area still has some sensitivity, but she report it is intact and healing well since completing radiation.  ROM: Denies any issues to her left arm/shoulder Lymphedema: Patient denies. Reports she's scheduled for PT evaluation 06/26/20 MedOnc F/U: Appointment with Mendel Ryder Causey-NP on 06/22/2020 before next Kadcyla infusion Other issues of note: Thinks she's scheduled for shoulder surgery repair some time in July. Recently found out she has emphysema, and believes that is the cause of her chest discomfort and shortness of breath. Waiting to be scheduled for evaluation with pulmonologist   Pt reports Yes No Comments  Tamoxifen []  [x]  Patient believes she won't be prescribed any type of anti-estrogen  Letrozole []  [x]    Anastrazole []  [x]    Mammogram []  Date: End of July []     Wt Readings from Last 3 Encounters:  06/16/20 162 lb 6.4 oz (73.7 kg)  06/14/20 163 lb (73.9 kg)  05/31/20 167 lb 6.4 oz (75.9 kg)   Vitals:   06/16/20 1431  BP: 124/75  Pulse: 73  Resp: 20  Temp: (!) 97 F (36.1 C)  SpO2: 95%

## 2020-06-19 ENCOUNTER — Telehealth: Payer: Self-pay | Admitting: Licensed Clinical Social Worker

## 2020-06-19 ENCOUNTER — Encounter: Payer: Self-pay | Admitting: Radiation Oncology

## 2020-06-19 NOTE — Telephone Encounter (Signed)
Received VM from patient from Friday afternoon requesting a call back. CSW attempted to return call today. No answer, left VM.   Christeen Douglas, LCSW

## 2020-06-19 NOTE — Progress Notes (Signed)
Radiation Oncology         [phone] ________________________________  Name: Felicia [name] MRN: [id number]  Date: 06/16/2020  DOB: 1950-10-25  Follow-Up Visit Note  Outpatient  CC: Felicia Lass, MD  Felicia [name], Felicia Dad, MD  Diagnosis and Prior Radiotherapy:    ICD-10-CM   1. Malignant neoplasm of upper-outer quadrant of left breast in female, estrogen receptor positive ([place])  C50.412    Z17.0    Cancer Staging Malignant neoplasm of upper-outer quadrant of left breast in female, estrogen receptor positive ([place]) Staging form: Breast, AJCC 8th Edition - Clinical stage from 09/08/2019: Stage IA (cT1c, cN0, cM0, G2, ER+, PR+, HER2+) - Unsigned Stage prefix: Initial diagnosis Histologic grading system: 3 grade system   CHIEF COMPLAINT: Here for follow-up and surveillance of breast cancer  Narrative:  The patient returns today for routine follow-up.   Ms. Hole presents today for follow-up after completing radiation to her left breast on 05/12/2020  Pain: Continues to deal with chest and left side/rib pain. Also reports her breast has some residual sensitivity.  Skin: Reports moist desquamation and weeping have resolved. Treatment area still has some sensitivity, but she report it is intact and healing well since completing radiation.  ROM: Denies any issues to her left arm/shoulder Lymphedema: Patient denies. Reports she's scheduled for PT evaluation 06/26/20 MedOnc F/U: Appointment with Felicia [name] on 06/22/2020 before next Kadcyla infusion Other issues of note: Thinks she's scheduled for shoulder surgery repair some time in July. Recently found out in cardiology she has emphysema based on chest CT, and believes that is the cause of her chronic chest discomfort (that preceded RT)  and shortness of breath. Waiting to be scheduled for evaluation with pulmonologist   Pt reports Yes No Comments  Tamoxifen []  [x]  Patient believes she won't be prescribed any type of anti-estrogen   Letrozole []  [x]    Anastrazole []  [x]    Mammogram []  Date: End of July []     Wt Readings from Last 3 Encounters:  06/16/20 162 lb 6.4 oz (73.7 kg)  06/14/20 163 lb (73.9 kg)  05/31/20 167 lb 6.4 oz (75.9 kg)   Vitals:   06/16/20 1431  BP: 124/75  Pulse: 73  Resp: 20  Temp: (!) 97 F (36.1 C)  SpO2: 95%                                 ALLERGIES:  is allergic to chloraprep one step [chlorhexidine gluconate], estrogens, and shrimp [shellfish allergy].  Meds: Current Outpatient Medications  Medication Sig Dispense Refill  . ACCU-CHEK GUIDE test strip 1 each by Other route as needed.    . Ascorbic Acid (VITAMIN C PO) Take 1 tablet by mouth daily.    Marland Kitchen aspirin EC 81 MG tablet Take 81 mg by mouth daily.    Marland Kitchen atorvastatin (LIPITOR) 40 MG tablet TAKE 1 TABLET BY MOUTH EVERY DAY 90 tablet 1  . bisoprolol (ZEBETA) 5 MG tablet TAKE 1 TABLET BY MOUTH EVERY DAY 90 tablet 2  . Calcium Citrate-Vitamin D (CALCIUM + D PO) Take 1 tablet by mouth daily with supper.    Marland Kitchen CINNAMON PO Take 1 capsule by mouth daily with supper.    . diclofenac sodium (VOLTAREN) 1 % GEL Apply 2-4 g topically 4 (four) times daily as needed (as directed for pain).    . Dulaglutide (TRULICITY) 1.5 GY/6.9SW SOPN Inject 1.5 mg into the skin once a week.    Marland Kitchen  escitalopram (LEXAPRO) 10 MG tablet Take 10 mg by mouth at bedtime.    . Ferrous Sulfate (IRON PO) Take 1 tablet by mouth daily. alternates days:1 tablet one day and 2 tablets the next day    . gabapentin (NEURONTIN) 300 MG capsule Take 1 capsule (300 mg total) by mouth at bedtime. 90 capsule 4  . GLIPIZIDE XL 10 MG 24 hr tablet Take 20 mg by mouth daily.   3  . JARDIANCE 25 MG TABS tablet Take 25 mg by mouth every morning.   3  . levothyroxine (SYNTHROID) 137 MCG tablet Take 137 mcg by mouth every morning.    . metFORMIN (GLUCOPHAGE) 500 MG tablet Take 500 mg by mouth 2 (two) times daily with a meal.    . metroNIDAZOLE (METROGEL) 1 % gel Apply 1 application  topically as needed (as directed to affected area). 45 g 2  . Multiple Vitamins-Calcium (ONE-A-DAY WOMENS PO) Take 1 tablet by mouth daily.    . nitroGLYCERIN (NITROSTAT) 0.4 MG SL tablet Place 1 tablet (0.4 mg total) under the tongue every 5 (five) minutes as needed for chest pain. 25 tablet 6  . omeprazole (PRILOSEC) 40 MG capsule Take 40 mg by mouth daily with supper.     . prochlorperazine (COMPAZINE) 10 MG tablet TAKE 1 TABLET BY MOUTH BEFORE MEALS AND AT BEDTIME ON DAYS 2 AND 3 AFTER CHEMOTHERAPY (COUNTING CHEMOTHERAPY DAY AS DAY 1) AFTER THAT MAY TAKE AS NEEDED 30 tablet 1  . rOPINIRole (REQUIP) 0.5 MG tablet Take 0.5 mg by mouth at bedtime. 1-3 hours prior to bedtime    . tiZANidine (ZANAFLEX) 4 MG tablet Take 4 mg by mouth every 8 (eight) hours as needed for muscle spasms. As needed    . traMADol (ULTRAM) 50 MG tablet Take 1 tablet (50 mg total) by mouth every 6 (six) hours as needed for moderate pain. 30 tablet 0  . TURMERIC PO Take 1 capsule by mouth daily with supper.     No current facility-administered medications for this encounter.    Physical Findings: The patient is in no acute distress. Patient is alert and oriented.  weight is 162 lb 6.4 oz (73.7 kg). Her temperature is 97 F (36.1 C) (abnormal). Her blood pressure is 124/75 and her pulse is 73. Her respiration is 20 and oxygen saturation is 95%. .    Satisfactory skin healing in radiotherapy fields.    Lab Findings: Lab Results  Component Value Date   WBC 9.1 05/31/2020   HGB 14.0 05/31/2020   HCT 43.7 05/31/2020   MCV 97.5 05/31/2020   PLT 182 05/31/2020    Radiographic Findings: Chest CT reviewed  Impression/Plan: Healing well from radiotherapy to the breast tissue.  Continue skin care with topical moisturizers for at least 2 more months for further healing.  I encouraged her to continue with yearly mammography and followup with medical oncology. I will see her back on an as-needed basis. I have encouraged  her to call if she has any issues or concerns in the future. I wished her the very  best.   Continue following w/ subspecialists for cardiac and lung issues.  On date of service, in total, I spent 25 minutes on this encounter. Patient was seen in person.  _____________________________________   Eppie Gibson, MD

## 2020-06-21 ENCOUNTER — Other Ambulatory Visit: Payer: Medicare Other

## 2020-06-21 ENCOUNTER — Ambulatory Visit: Payer: Medicare Other

## 2020-06-21 NOTE — Progress Notes (Signed)
[place]  [phone] Fax:([phone]) [phone]     ID: Felicia [name] DOB: 1950/05/27  MR#: [id number]  XMI#:[id number]  Patient Care Team: Kathyrn Lass, MD as PCP - General (Family Medicine) Nahser, [name], MD as PCP - Cardiology (Cardiology) Mauro Kaufmann, RN as Oncology Nurse Navigator Rockwell Germany, RN as Oncology Nurse Navigator Rolm Bookbinder, MD as Consulting Physician (General Surgery) Magrinat, Virgie Dad, MD as Consulting Physician (Oncology) Eppie Gibson, MD as Attending Physician (Radiation Oncology) [name], MD as Consulting Physician (Rheumatology) [name], MD as Consulting Physician (Orthopedic Surgery) Janyth Pupa, DO as Consulting Physician (Obstetrics and Gynecology) Larey Dresser, MD as Consulting Physician (Cardiology) Madelin Rear, MD as Consulting Physician (Endocrinology) Magrinat, Virgie Dad, MD as Consulting Physician (Oncology) Maryanna Shape, NP as Nurse Practitioner (Oncology) Scot Dock, NP OTHER MD:  CHIEF COMPLAINT: Triple positive invasive breast cancer  CURRENT TREATMENT: T-DM1   INTERVAL HISTORY: Felicia [name] returns today for follow up and treatment of her triple positive invasive breast cancer.    She is receiving TDM-1 every 3 weeks and tolerates it well.  Over the past couple of months she has noted an increase in shortness of breath and has been struggling with this.  She has undergone a complete cardiology work up that is negative and is due for eval with pulmonology in July. She is otherwise feeling well.  She notes the shortness of breath does limit her activity level, and she is looking forward to meeting with pulmonology to explore potential etiologies and solutions.    Her most recent echocardiogram from 05/17/2020/2022 showed a normal EF of 60-65%.    REVIEW OF SYSTEMS: Review of Systems  Constitutional:  Positive for fatigue. Negative for appetite change, chills, fever and  unexpected weight change.  HENT:   Negative for hearing loss, lump/mass and trouble swallowing.   Eyes:  Negative for eye problems and icterus.  Respiratory:  Positive for shortness of breath. Negative for chest tightness and cough.   Cardiovascular:  Negative for chest pain, leg swelling and palpitations.  Gastrointestinal:  Negative for abdominal distention, abdominal pain, constipation, diarrhea, nausea and vomiting.  Endocrine: Negative for hot flashes.  Genitourinary:  Negative for difficulty urinating.   Musculoskeletal:  Negative for arthralgias.  Skin:  Negative for itching and rash.  Neurological:  Negative for dizziness, extremity weakness, headaches and numbness.  Hematological:  Negative for adenopathy. Does not bruise/bleed easily.  Psychiatric/Behavioral:  Negative for depression. The patient is not nervous/anxious.      COVID 19 VACCINATION STATUS: Refuses vaccination   HISTORY OF CURRENT ILLNESS: From the original intake note:  Felicia [name] had routine screening mammography on 08/03/2019 showing a possible abnormality in the left breast. She underwent left diagnostic mammography with tomography and left breast ultrasonography at [facility] on 08/17/2019 showing: breast density category B; 1.6 cm upper-outer left breast mass; no significant left axillary abnormalities.  Accordingly on 08/30/2019 she proceeded to biopsy of the left breast area in question. The pathology from this procedure ([id number]) showed: invasive mammary carcinoma, e-cadherin positive, grade 2. Prognostic indicators significant for: estrogen receptor, 100% positive and progesterone receptor, 40% positive, both with strong staining intensity. Proliferation marker Ki67 at 20%. HER2 equivocal by immunohistochemistry (2+), but negative by fluorescent in situ hybridization with a signals ratio   and number per cell  .  Of note, she also has a history of FIGO grade 3, stage IA endometrial carcinoma, diagnosed in 2014  in [place]. She underwent  robotic assisted total hysterectomy with BSO and pelvic and perirectal aortic lymph node dissection as well as omental biopsy.  Tumor was found only in the endometrium arising in a polyp measuring 1.7 cm.  There was no myometrial invasion no lymphovascular space invasion, and no lower uterine segment involvement.  The cervix and adnexa, lymph nodes and omentum were all not involved.  She then received vaginal brachytherapy, no chemotherapy, completing treatment in the springtime of 2015. [Per patient's report, chemotherapy was suggested, but she opted against this.]  The patient's subsequent history is as detailed below.   PAST MEDICAL HISTORY: Past Medical History:  Diagnosis Date   Anemia    Anxiety    Breast cancer ([place]) 09/2019   left breast IMC   CAD (coronary artery disease)    a. 3/2007s/p DES to the LAD [facility]); b. 01/2017 MV: EF 80%, small, mild apical ant defect w/o ischemia (felt to be breast atten). Low risk.   Depression    DM (diabetes mellitus) ([place])    Essential hypertension    GERD (gastroesophageal reflux disease)    Headache    secondary to neck surgery per patient   High triglycerides    Hyperlipidemia    Hypothyroid    OA (osteoarthritis)    right knee,hands   Overflow incontinence    PONV (postoperative nausea and vomiting)    s/p gallbladder    RLS (restless legs syndrome)    Urinary urgency    Uterine cancer ([place]) dx'd 2014    PAST SURGICAL HISTORY: Past Surgical History:  Procedure Laterality Date   BREAST LUMPECTOMY WITH RADIOACTIVE SEED AND SENTINEL LYMPH NODE BIOPSY Left 10/07/2019   Procedure: LEFT BREAST LUMPECTOMY WITH RADIOACTIVE SEED AND SENTINEL LYMPH NODE BIOPSY;  Surgeon: Rolm Bookbinder, MD;  Location: [place] [facility];  Service: General;  Laterality: Left;   CORONARY STENT PLACEMENT     CORONARY/GRAFT ACUTE MI REVASCULARIZATION     FINGER ARTHRODESIS Right 06/11/2019   Procedure:  ARTHRODESIS INDEX FINGER DISTAL PHALANGEAL JOINT;  Surgeon: [name], MD;  Location: [facility];  Service: Orthopedics;  Laterality: Right;   FOOT SURGERY Left    LEFT HEART CATH AND CORONARY ANGIOGRAPHY N/A 02/16/2018   Procedure: LEFT HEART CATH AND CORONARY ANGIOGRAPHY;  Surgeon: Lorretta Harp, MD;  Location: [facility] [place];  Service: Cardiovascular;  Laterality: N/A;   LEFT HEART CATH AND CORONARY ANGIOGRAPHY N/A 05/01/2020   Procedure: LEFT HEART CATH AND CORONARY ANGIOGRAPHY;  Surgeon: Lorretta Harp, MD;  Location: [facility];  Service: Cardiovascular;  Laterality: N/A;   PORTACATH PLACEMENT Right 11/23/2019   Procedure: INSERTION PORT-A-CATH WITH ULTRASOUND GUIDANCE;  Surgeon: Rolm Bookbinder, MD;  Location: [facility];  Service: General;  Laterality: Right;   REPLACEMENT TOTAL KNEE Right    SPINE SURGERY     TOTAL ABDOMINAL HYSTERECTOMY     FOR UTERUS CANCER   TOTAL HIP ARTHROPLASTY Left     FAMILY HISTORY: Family History  Problem Relation Age of Onset   AAA (abdominal aortic aneurysm) Mother    Diabetes Father    Alzheimer's disease Father    Throat cancer Sister    Breast cancer Cousin    Her father died at age 27 from Alzheimer's. Her mother died at age 6 from a ruptured abdominal aneurysm. Felicia [name] has 2 brothers and 3 sisters. She reports breast cancer in a paternal cousin. She also notes throat cancer in her sister.   GYNECOLOGIC HISTORY:  No LMP recorded. Patient has had a hysterectomy. Menarche: 70 years old Age at first live birth: 71 years old [place] P 2 LMP 1996 Contraceptive: prior use without issue HRT never used  Hysterectomy? Yes, 2014 BSO? 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  She unpacks 40 pounds boxes of chicken, has to cook them in 13 pound cooking pans, and is generally on her feet at work but does not otherwise exercise.  She is divorced. She lives  at home by herself, with no pets. Daughter Pershing Proud, age 66, is a radiology tech in [place]. Daughter Martyn Malay, age 29, is an Optometrist in [place], [place]. Amyah has one grandchild. She is not a Ambulance person.    ADVANCED DIRECTIVES: Not in place; intends to name daughter [name] as her HCPOA.   HEALTH MAINTENANCE: Social History   Tobacco Use   Smoking status: Never   Smokeless tobacco: Never  Vaping Use   Vaping Use: Never used  Substance Use Topics   Alcohol use: Yes    Comment: OCCASIONALLY   Drug use: No     Colonoscopy: <10 years ago, in [place]  PAP: 2014?  Bone density: date unsure, also in [place]   Allergies  Allergen Reactions   Chloraprep One Step [Chlorhexidine Gluconate] Rash   Estrogens Other (See Comments)    PATIENT HAS A HISTORY OF CANCER AND HAS BEEN TOLD TO NEVER TAKE ANYTHING CONTAINING ESTROGEN, AS IT MIGHT CAUSE A RECURRENCE   Shrimp [Shellfish Allergy] Nausea And Vomiting    Current Outpatient Medications  Medication Sig Dispense Refill   ACCU-CHEK GUIDE test strip 1 each by Other route as needed.     Ascorbic Acid (VITAMIN C PO) Take 1 tablet by mouth daily.     aspirin EC 81 MG tablet Take 81 mg by mouth daily.     atorvastatin (LIPITOR) 40 MG tablet TAKE 1 TABLET BY MOUTH EVERY DAY 90 tablet 1   bisoprolol (ZEBETA) 5 MG tablet TAKE 1 TABLET BY MOUTH EVERY DAY 90 tablet 2   Calcium Citrate-Vitamin D (CALCIUM + D PO) Take 1 tablet by mouth daily with supper.     CINNAMON PO Take 1 capsule by mouth daily with supper.     diclofenac sodium (VOLTAREN) 1 % GEL Apply 2-4 g topically 4 (four) times daily as needed (as directed for pain).     Dulaglutide (TRULICITY) 1.5 YK/9.9IP SOPN Inject 1.5 mg into the skin once a week.     escitalopram (LEXAPRO) 10 MG tablet Take 10 mg by mouth at bedtime.     Ferrous Sulfate (IRON PO) Take 1 tablet by mouth daily. alternates days:1 tablet one day and 2 tablets the next day     gabapentin (NEURONTIN)  300 MG capsule Take 1 capsule (300 mg total) by mouth at bedtime. 90 capsule 4   GLIPIZIDE XL 10 MG 24 hr tablet Take 20 mg by mouth daily.   3   JARDIANCE 25 MG TABS tablet Take 25 mg by mouth every morning.   3   levothyroxine (SYNTHROID) 137 MCG tablet Take 137 mcg by mouth every morning.     metFORMIN (GLUCOPHAGE) 500 MG tablet Take 500 mg by mouth 2 (two) times daily with a meal.     metroNIDAZOLE (METROGEL) 1 % gel Apply 1 application topically as needed (as directed to affected area). 45 g 2   Multiple Vitamins-Calcium (ONE-A-DAY WOMENS PO) Take 1 tablet by  mouth daily.     nitroGLYCERIN (NITROSTAT) 0.4 MG SL tablet Place 1 tablet (0.4 mg total) under the tongue every 5 (five) minutes as needed for chest pain. 25 tablet 6   omeprazole (PRILOSEC) 40 MG capsule Take 40 mg by mouth daily with supper.      prochlorperazine (COMPAZINE) 10 MG tablet TAKE 1 TABLET BY MOUTH BEFORE MEALS AND AT BEDTIME ON DAYS 2 AND 3 AFTER CHEMOTHERAPY (COUNTING CHEMOTHERAPY DAY AS DAY 1) AFTER THAT MAY TAKE AS NEEDED 30 tablet 1   rOPINIRole (REQUIP) 0.5 MG tablet Take 0.5 mg by mouth at bedtime. 1-3 hours prior to bedtime     tiZANidine (ZANAFLEX) 4 MG tablet Take 4 mg by mouth every 8 (eight) hours as needed for muscle spasms. As needed     traMADol (ULTRAM) 50 MG tablet Take 1 tablet (50 mg total) by mouth every 6 (six) hours as needed for moderate pain. 30 tablet 0   TURMERIC PO Take 1 capsule by mouth daily with supper.     No current facility-administered medications for this visit.   Facility-Administered Medications Ordered in Other Visits  Medication Dose Route Frequency Provider Last Rate Last Admin   ado-trastuzumab emtansine (KADCYLA) 280 mg in sodium chloride 0.9 % 250 mL chemo infusion  3.6 mg/kg (Treatment Plan Recorded) Intravenous Once Magrinat, Virgie Dad, MD 528 mL/hr at 06/22/20 1348 280 mg at 06/22/20 1348   heparin lock flush 100 unit/mL  500 Units Intracatheter Once PRN Magrinat, Virgie Dad, MD        sodium chloride flush (NS) 0.9 % injection 10 mL  10 mL Intracatheter PRN Magrinat, Virgie Dad, MD        OBJECTIVE:  Vitals:   06/22/20 1213  BP: 125/62  Pulse: 77  Resp: 18  Temp: (!) 97.3 F (36.3 C)  SpO2: 97%     Body mass index is 28.13 kg/m.   Wt Readings from Last 3 Encounters:  06/22/20 163 lb 14.4 oz (74.3 kg)  06/16/20 162 lb 6.4 oz (73.7 kg)  06/14/20 163 lb (73.9 kg)  ECOG FS:1 - Symptomatic but completely ambulatory GENERAL: Patient is a well appearing female in no acute distress HEENT:  Sclerae anicteric.  Oropharynx clear and moist. No ulcerations or evidence of oropharyngeal candidiasis. Neck is supple.  NODES:  No cervical, supraclavicular, or axillary lymphadenopathy palpated.  BREAST EXAM:  right breast benign, left breast s/p lumpectomy and radiation, no sign of local recurrence. LUNGS:  Clear to auscultation bilaterally.  No wheezes or rhonchi. HEART:  Regular rate and rhythm. No murmur appreciated. ABDOMEN:  Soft, nontender.  Positive, normoactive bowel sounds. No organomegaly palpated. MSK:  No focal spinal tenderness to palpation. Full range of motion bilaterally in the upper extremities. EXTREMITIES:  No peripheral edema.   SKIN:  Clear with no obvious rashes or skin changes. No nail dyscrasia. NEURO:  Nonfocal. Well oriented.  Appropriate affect.     LAB RESULTS:  CMP     Component Value Date/Time   NA 139 06/22/2020 1157   NA 139 11/25/2018 1053   K 4.0 06/22/2020 1157   CL 100 06/22/2020 1157   CO2 27 06/22/2020 1157   GLUCOSE 260 (H) 06/22/2020 1157   BUN 14 06/22/2020 1157   BUN 18 11/25/2018 1053   CREATININE 0.73 06/22/2020 1157   CALCIUM 10.4 (H) 06/22/2020 1157   PROT 7.6 06/22/2020 1157   PROT 7.1 11/25/2018 1053   ALBUMIN 3.8 06/22/2020 1157   ALBUMIN 4.2 11/25/2018  1053   AST 41 06/22/2020 1157   ALT 44 06/22/2020 1157   ALKPHOS 67 06/22/2020 1157   BILITOT 0.5 06/22/2020 1157   GFRNONAA >60 06/22/2020 1157   GFRAA  >60 10/04/2019 1330   GFRAA >60 09/08/2019 0811    No results found for: TOTALPROTELP, ALBUMINELP, A1GS, A2GS, BETS, BETA2SER, GAMS, MSPIKE, SPEI  Lab Results  Component Value Date   WBC 8.0 06/22/2020   NEUTROABS 3.9 06/22/2020   HGB 14.5 06/22/2020   HCT 44.5 06/22/2020   MCV 94.9 06/22/2020   PLT 178 06/22/2020    No results found for: LABCA2  No components found for: KZSWFU932  No results for input(s): INR in the last 168 hours.  No results found for: LABCA2  No results found for: TFT732  No results found for: KGU542  No results found for: HCW237  No results found for: CA2729  No components found for: HGQUANT  No results found for: CEA1 / No results found for: CEA1   No results found for: AFPTUMOR  No results found for: CHROMOGRNA  No results found for: KPAFRELGTCHN, LAMBDASER, KAPLAMBRATIO (kappa/lambda light chains)  No results found for: HGBA, HGBA2QUANT, HGBFQUANT, HGBSQUAN (Hemoglobinopathy evaluation)   No results found for: LDH  No results found for: IRON, TIBC, IRONPCTSAT (Iron and TIBC)  No results found for: FERRITIN  Urinalysis No results found for: COLORURINE, APPEARANCEUR, LABSPEC, PHURINE, GLUCOSEU, HGBUR, BILIRUBINUR, KETONESUR, PROTEINUR, UROBILINOGEN, NITRITE, LEUKOCYTESUR   STUDIES: No results found.   ELIGIBLE FOR AVAILABLE RESEARCH PROTOCOL: AET  ASSESSMENT: 70 y.o. [place] woman status post left breast upper outer quadrant biopsy 08/30/2019 for a clinical T1c N0, stage IA invasive ductal carcinoma, grade 2, estrogen and progesterone receptor positive, HER-2 amplified, with an MIB-1 of 20%.  (0) status post TAH/BSO 2014 for a FIGO grade 3, stage IA endometrial carcinoma, status post vaginal brachytherapy, no adjuvant chemotherapy  (1) status post left lumpectomy and axillary sentinel node sampling 10/07/2019 for a pT1c pN1, stage IB invasive ductal carcinoma, grade 2, with negative margins.  (a) a total of 1 sentinel lymph  node was removed.  (2) adjuvant chemotherapy consisting of carboplatin, gemcitabine, trastuzumab given every 3 weeks x 6 started 11/24/2019, last dose 01/26/2020  (a) baseline echo 09/15/2019 shows an ejection fraction in the 60-65% range.  (b) will not use docetaxel secondary to severe peripheral neuropathy present at baseline  (c) echo on 01/17/2020 shows EF of 60-65%  (d) chemotherapy discontinued after 4 cycles with poor tolerance.  (3) T-DM1 started 02/16/2020, to be continued through November 2022  (a) echo 5/24 EF 62-83@  (4) adjuvant radiation completed in April-May  (5) antiestrogens to start at the completion of local treatment   PLAN: Felicia [name] is here today for continued treatment with TDM1 for her triple positive breast cancer.  She is tolerating treatment well.  She will continue with this.  Her most recent echo was normal and she will repeat this again in August of this year.    Felicia [name] is struggling with shortness of breath and will f/u with pulmonology next month.  She and I spent a larger portion discussing this and the possible etiologies of her shortness of breath, like radiation changes to the lung, or emphysema.    I spent approximately 20 minutes reviewing the assessment plan with patient, recommendations, and in the visit.  I also spent that time documenting the note above.    Wilber Bihari, NP 06/22/20 1:55 PM Medical Oncology and Hematology [address]  [place], [place] [address] Tel. [phone]    Fax. [phone]    *Total Encounter Time as defined by the Centers for Medicare and Medicaid Services includes, in addition to the face-to-face time of a patient visit (documented in the note above) non-face-to-face time: obtaining and reviewing outside history, ordering and reviewing medications, tests or procedures, care coordination (communications with other health care professionals or caregivers) and documentation in the medical  record.

## 2020-06-22 ENCOUNTER — Inpatient Hospital Stay: Payer: Medicare Other | Attending: Oncology | Admitting: Adult Health

## 2020-06-22 ENCOUNTER — Other Ambulatory Visit: Payer: Medicare Other

## 2020-06-22 ENCOUNTER — Inpatient Hospital Stay: Payer: Medicare Other

## 2020-06-22 ENCOUNTER — Other Ambulatory Visit: Payer: Self-pay

## 2020-06-22 ENCOUNTER — Encounter: Payer: Self-pay | Admitting: *Deleted

## 2020-06-22 ENCOUNTER — Encounter: Payer: Self-pay | Admitting: Adult Health

## 2020-06-22 VITALS — BP 125/62 | HR 77 | Temp 97.3°F | Resp 18 | Wt 163.9 lb

## 2020-06-22 DIAGNOSIS — E785 Hyperlipidemia, unspecified: Secondary | ICD-10-CM | POA: Insufficient documentation

## 2020-06-22 DIAGNOSIS — Z170 Estrogen receptor positive status [ER+]: Secondary | ICD-10-CM

## 2020-06-22 DIAGNOSIS — Z5112 Encounter for antineoplastic immunotherapy: Secondary | ICD-10-CM | POA: Insufficient documentation

## 2020-06-22 DIAGNOSIS — E1042 Type 1 diabetes mellitus with diabetic polyneuropathy: Secondary | ICD-10-CM | POA: Insufficient documentation

## 2020-06-22 DIAGNOSIS — Z818 Family history of other mental and behavioral disorders: Secondary | ICD-10-CM | POA: Insufficient documentation

## 2020-06-22 DIAGNOSIS — Z833 Family history of diabetes mellitus: Secondary | ICD-10-CM | POA: Diagnosis not present

## 2020-06-22 DIAGNOSIS — Z923 Personal history of irradiation: Secondary | ICD-10-CM | POA: Diagnosis not present

## 2020-06-22 DIAGNOSIS — R5383 Other fatigue: Secondary | ICD-10-CM | POA: Insufficient documentation

## 2020-06-22 DIAGNOSIS — C50412 Malignant neoplasm of upper-outer quadrant of left female breast: Secondary | ICD-10-CM | POA: Insufficient documentation

## 2020-06-22 DIAGNOSIS — Z7984 Long term (current) use of oral hypoglycemic drugs: Secondary | ICD-10-CM | POA: Diagnosis not present

## 2020-06-22 DIAGNOSIS — Z95828 Presence of other vascular implants and grafts: Secondary | ICD-10-CM

## 2020-06-22 DIAGNOSIS — Z803 Family history of malignant neoplasm of breast: Secondary | ICD-10-CM | POA: Insufficient documentation

## 2020-06-22 DIAGNOSIS — I2510 Atherosclerotic heart disease of native coronary artery without angina pectoris: Secondary | ICD-10-CM | POA: Insufficient documentation

## 2020-06-22 DIAGNOSIS — Z808 Family history of malignant neoplasm of other organs or systems: Secondary | ICD-10-CM | POA: Diagnosis not present

## 2020-06-22 DIAGNOSIS — Z8249 Family history of ischemic heart disease and other diseases of the circulatory system: Secondary | ICD-10-CM | POA: Insufficient documentation

## 2020-06-22 DIAGNOSIS — Z90722 Acquired absence of ovaries, bilateral: Secondary | ICD-10-CM | POA: Diagnosis not present

## 2020-06-22 DIAGNOSIS — R0602 Shortness of breath: Secondary | ICD-10-CM | POA: Diagnosis not present

## 2020-06-22 DIAGNOSIS — Z79899 Other long term (current) drug therapy: Secondary | ICD-10-CM | POA: Insufficient documentation

## 2020-06-22 LAB — CBC WITH DIFFERENTIAL (CANCER CENTER ONLY)
Abs Immature Granulocytes: 0.02 10*3/uL (ref 0.00–0.07)
Basophils Absolute: 0 10*3/uL (ref 0.0–0.1)
Basophils Relative: 0 %
Eosinophils Absolute: 0.1 10*3/uL (ref 0.0–0.5)
Eosinophils Relative: 1 %
HCT: 44.5 % (ref 36.0–46.0)
Hemoglobin: 14.5 g/dL (ref 12.0–15.0)
Immature Granulocytes: 0 %
Lymphocytes Relative: 42 %
Lymphs Abs: 3.4 10*3/uL (ref 0.7–4.0)
MCH: 30.9 pg (ref 26.0–34.0)
MCHC: 32.6 g/dL (ref 30.0–36.0)
MCV: 94.9 fL (ref 80.0–100.0)
Monocytes Absolute: 0.7 10*3/uL (ref 0.1–1.0)
Monocytes Relative: 9 %
Neutro Abs: 3.9 10*3/uL (ref 1.7–7.7)
Neutrophils Relative %: 48 %
Platelet Count: 178 10*3/uL (ref 150–400)
RBC: 4.69 MIL/uL (ref 3.87–5.11)
RDW: 15.4 % (ref 11.5–15.5)
WBC Count: 8 10*3/uL (ref 4.0–10.5)
nRBC: 0 % (ref 0.0–0.2)

## 2020-06-22 LAB — CMP (CANCER CENTER ONLY)
ALT: 44 U/L (ref 0–44)
AST: 41 U/L (ref 15–41)
Albumin: 3.8 g/dL (ref 3.5–5.0)
Alkaline Phosphatase: 67 U/L (ref 38–126)
Anion gap: 12 (ref 5–15)
BUN: 14 mg/dL (ref 8–23)
CO2: 27 mmol/L (ref 22–32)
Calcium: 10.4 mg/dL — ABNORMAL HIGH (ref 8.9–10.3)
Chloride: 100 mmol/L (ref 98–111)
Creatinine: 0.73 mg/dL (ref 0.44–1.00)
GFR, Estimated: >60 mL/min (ref >60)
Glucose, Bld: 260 mg/dL — ABNORMAL HIGH (ref 70–99)
Potassium: 4 mmol/L (ref 3.5–5.1)
Sodium: 139 mmol/L (ref 135–145)
Total Bilirubin: 0.5 mg/dL (ref 0.3–1.2)
Total Protein: 7.6 g/dL (ref 6.5–8.1)

## 2020-06-22 MED ORDER — ACETAMINOPHEN 325 MG PO TABS
ORAL_TABLET | ORAL | Status: AC
  Filled 2020-06-22: qty 2

## 2020-06-22 MED ORDER — DIPHENHYDRAMINE HCL 25 MG PO CAPS
ORAL_CAPSULE | ORAL | Status: AC
  Filled 2020-06-22: qty 1

## 2020-06-22 MED ORDER — DIPHENHYDRAMINE HCL 25 MG PO CAPS
25.0000 mg | ORAL_CAPSULE | Freq: Once | ORAL | Status: AC
  Administered 2020-06-22: 25 mg via ORAL

## 2020-06-22 MED ORDER — SODIUM CHLORIDE 0.9% FLUSH
10.0000 mL | INTRAVENOUS | Status: DC | PRN
  Administered 2020-06-22: 10 mL
  Filled 2020-06-22: qty 10

## 2020-06-22 MED ORDER — ACETAMINOPHEN 325 MG PO TABS
650.0000 mg | ORAL_TABLET | Freq: Once | ORAL | Status: AC
  Administered 2020-06-22: 650 mg via ORAL

## 2020-06-22 MED ORDER — LORAZEPAM 2 MG/ML IJ SOLN
0.5000 mg | Freq: Once | INTRAMUSCULAR | Status: AC
  Administered 2020-06-22: 0.5 mg via INTRAVENOUS

## 2020-06-22 MED ORDER — SODIUM CHLORIDE 0.9 % IV SOLN
3.6000 mg/kg | Freq: Once | INTRAVENOUS | Status: AC
  Administered 2020-06-22: 280 mg via INTRAVENOUS
  Filled 2020-06-22: qty 14

## 2020-06-22 MED ORDER — HEPARIN SOD (PORK) LOCK FLUSH 100 UNIT/ML IV SOLN
500.0000 [IU] | Freq: Once | INTRAVENOUS | Status: AC | PRN
  Administered 2020-06-22: 500 [IU]
  Filled 2020-06-22: qty 5

## 2020-06-22 MED ORDER — SODIUM CHLORIDE 0.9% FLUSH
10.0000 mL | Freq: Once | INTRAVENOUS | Status: AC
  Administered 2020-06-22: 10 mL
  Filled 2020-06-22: qty 10

## 2020-06-22 MED ORDER — SODIUM CHLORIDE 0.9 % IV SOLN
Freq: Once | INTRAVENOUS | Status: AC
Start: 2020-06-22 — End: 2020-06-22
  Filled 2020-06-22: qty 250

## 2020-06-22 MED ORDER — LORAZEPAM 2 MG/ML IJ SOLN
INTRAMUSCULAR | Status: AC
  Filled 2020-06-22: qty 1

## 2020-06-22 NOTE — Patient Instructions (Signed)
[facility]ICAL [facility]COLOG[facility]   °Discharge Instructions: °Thank you for choosing [facility] to provide your oncology and hematology care.  ° °If you have a lab appointment with the Cancer Center, please go directly to the Cancer Center and check in at the registration area. °  °Wear comfortable clothing and clothing appropriate for easy access to any Portacath or PICC line.  ° °We strive to give you quality time with your provider. You may need to reschedule your appointment if you arrive late (15 or more minutes).  Arriving late affects you and other patients whose appointments are after yours.  Also, if you miss three or more appointments without notifying the office, you may be dismissed from the clinic at the provider’s discretion.    °  °For prescription refill requests, have your pharmacy contact our office and allow 72 hours for refills to be completed.   ° °Today you received the following chemotherapy and/or immunotherapy agents: ado-trastuzumab emtansine    °  °To help prevent nausea and vomiting after your treatment, we encourage you to take your nausea medication as directed. ° °BELOW ARE SYMPTOMS THAT SHOULD BE REPORTED IMMEDIATELY: °*FEVER GREATER THAN 100.4 F (38 °C) OR HIGHER °*CHILLS OR SWEATING °*NAUSEA AND VOMITING THAT IS NOT CONTROLLED WITH YOUR NAUSEA MEDICATION °*UNUSUAL SHORTNESS OF BREATH °*UNUSUAL BRUISING OR BLEEDING °*URINARY PROBLEMS (pain or burning when urinating, or frequent urination) °*BOWEL PROBLEMS (unusual diarrhea, constipation, pain near the anus) °TENDERNESS IN MOUTH AND THROAT WITH OR WITHOUT PRESENCE OF ULCERS (sore throat, sores in mouth, or a toothache) °UNUSUAL RASH, SWELLING OR PAIN  °UNUSUAL VAGINAL DISCHARGE OR ITCHING  ° °Items with * indicate a potential emergency and should be followed up as soon as possible or go to the Emergency Department if any problems should occur. ° °Please show the CHEMOTHERAPY ALERT CARD or IMMUNOTHERAPY ALERT  CARD at check-in to the Emergency Department and triage nurse. ° °Should you have questions after your visit or need to cancel or reschedule your appointment, please contact [facility]ICAL [facility]COL[facility]  Dept: [phone]  and follow the prompts.  Office hours are 8:00 a.m. to 4:30 p.m. Monday - Friday. Please note that voicemails left after 4:00 p.m. may not be returned until the following business day.  We are closed weekends and major holidays. You have access to a nurse at all times for urgent questions. Please call the main number to the clinic Dept: [phone] and follow the prompts. ° ° °For any non-urgent questions, you may also contact your provider using MyChart. We now offer e-Visits for anyone 18 and older to request care online for non-urgent symptoms. For details visit [email or web address]. °  °Also download the MyChart app! Go to the app store, search "MyChart", open the app, select [facility], and log in with your MyChart username and password. ° °Due to Covid, a mask is required upon entering the hospital/clinic. If you do not have a mask, one will be given to you upon arrival. For doctor visits, patients may have 1 support person aged 18 or older with them. For treatment visits, patients cannot have anyone with them due to current Covid guidelines and our immunocompromised population.  ° °

## 2020-06-26 ENCOUNTER — Ambulatory Visit: Payer: Medicare Other | Attending: General Surgery | Admitting: Physical Therapy

## 2020-06-26 ENCOUNTER — Other Ambulatory Visit: Payer: Self-pay

## 2020-06-26 DIAGNOSIS — Z483 Aftercare following surgery for neoplasm: Secondary | ICD-10-CM

## 2020-06-26 NOTE — Therapy (Signed)
[facility] [address] [place], [place], [address] Phone: [phone]   Fax:  [phone]  Physical Therapy Treatment  Patient Details  Name: Felicia [name] MRN: [id number] Date of Birth: 08/24/1950 Referring Provider (PT): Dr. Magrinat   Encounter Date: 06/26/2020   PT End of Session - 06/26/20 1522     Visit Number 3   not changed due to screen   PT Start Time 1500    PT Stop Time 1510    PT Time Calculation (min) 10 min    Activity Tolerance Patient tolerated treatment well             Past Medical History:  Diagnosis Date   Anemia    Anxiety    Breast cancer (HCC) 09/2019   left breast IMC   CAD (coronary artery disease)    a. 3/2007s/p DES to the LAD ([place]); b. 01/2017 MV: EF 80%, small, mild apical ant defect w/o ischemia (felt to be breast atten). Low risk.   Depression    DM (diabetes mellitus) (HCC)    Essential hypertension    GERD (gastroesophageal reflux disease)    Headache    secondary to neck surgery per patient   High triglycerides    Hyperlipidemia    Hypothyroid    OA (osteoarthritis)    right knee,hands   Overflow incontinence    PONV (postoperative nausea and vomiting)    s/p gallbladder    RLS (restless legs syndrome)    Urinary urgency    Uterine cancer (HCC) dx'd 2014    Past Surgical History:  Procedure Laterality Date   BREAST LUMPECTOMY WITH RADIOACTIVE SEED AND SENTINEL LYMPH NODE BIOPSY Left 10/07/2019   Procedure: LEFT BREAST LUMPECTOMY WITH RADIOACTIVE SEED AND SENTINEL LYMPH NODE BIOPSY;  Surgeon: Wakefield, Matthew, MD;  Location: [facility];  Service: General;  Laterality: Left;   CORONARY STENT PLACEMENT     CORONARY/GRAFT ACUTE MI REVASCULARIZATION     FINGER ARTHRODESIS Right 06/11/2019   Procedure: ARTHRODESIS INDEX FINGER DISTAL PHALANGEAL JOINT;  Surgeon: Kuzma, Kevin, MD;  Location: [facility];  Service: Orthopedics;   Laterality: Right;   FOOT SURGERY Left    LEFT HEART CATH AND CORONARY ANGIOGRAPHY N/A 02/16/2018   Procedure: LEFT HEART CATH AND CORONARY ANGIOGRAPHY;  Surgeon: Berry, Jonathan J, MD;  Location: [facility];  Service: Cardiovascular;  Laterality: N/A;   LEFT HEART CATH AND CORONARY ANGIOGRAPHY N/A 05/01/2020   Procedure: LEFT HEART CATH AND CORONARY ANGIOGRAPHY;  Surgeon: Berry, Jonathan J, MD;  Location: [facility];  Service: Cardiovascular;  Laterality: N/A;   PORTACATH PLACEMENT Right 11/23/2019   Procedure: INSERTION PORT-A-CATH WITH ULTRASOUND GUIDANCE;  Surgeon: Wakefield, Matthew, MD;  Location: [facility];  Service: General;  Laterality: Right;   REPLACEMENT TOTAL KNEE Right    SPINE SURGERY     TOTAL ABDOMINAL HYSTERECTOMY     FOR UTERUS CANCER   TOTAL HIP ARTHROPLASTY Left     There were no vitals filed for this visit.   Subjective Assessment - 06/26/20 1453     Subjective Pt returns for her 3 month L-Dex screen.    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 She underwent a left lumpectomy and 1 positive node removed on 10/07/2019. Ki67 is 20%. She has a history of uterine cancer in 2014 treated with hysterectomy and radiation, a left hip replacement, right   knee replacement, and a C4-C7 fusion in 2014.                    L-DEX FLOWSHEETS - 06/26/20 1457       L-DEX LYMPHEDEMA SCREENING   POSITION  Standing   value change right 2.4                                   PT Long Term Goals - 01/21/20 1056       PT LONG TERM GOAL #1   Title Pt will report ability to get off the commode without pulling on the vanity    Time 8    Period Weeks    Status New      PT LONG TERM GOAL #2   Title Pt will improve BERG balance score to 45 or better to decrease risk of falls    Time 8    Period Weeks    Status New      PT LONG TERM  GOAL #3   Title Pt will report no LOB backwards with standing activities x 1 week    Time 8    Period Weeks      PT LONG TERM GOAL #4   Title Pt will be ind with HEP similar to OTAGO    Time 8    Period Weeks    Status New                   Plan - 06/26/20 1523     Clinical Impression Statement Pt returns for her 3 month L-Dex screen. Her change from baseline is 2.4 ( current in unilateral  right arm in standing is 5.7 from a baseline of 3.3 so a 2.4 change from baseline) which is  WNL    PT Next Visit Plan Cont every 3 month L-Dex screens for up to 2 years from [facility].             Patient will benefit from skilled therapeutic intervention in order to improve the following deficits and impairments:     Visit Diagnosis: Aftercare following surgery for neoplasm     Problem List Patient Active Problem List   Diagnosis Date Noted   Pressure injury of skin 04/30/2020   Hypothyroid    Anxiety    Depression    Port-A-Cath in place 11/24/2019   Hepatic steatosis 09/28/2019   Malignant neoplasm of upper-outer quadrant of left breast in female, estrogen receptor positive (HCC) 09/02/2019   Endometrial cancer (HCC) 10/23/2018   Cirrhosis of liver without ascites (HCC) 10/23/2018   Diarrhea 10/23/2018   Chest pain 02/15/2018   Coronary artery disease involving native coronary artery of native heart without angina pectoris 11/18/2017   Hyperlipidemia LDL goal <70 11/18/2017   Type 2 diabetes mellitus without complication, without long-term current use of insulin (HCC) 11/18/2017   Dizziness 11/18/2017   Teresa K. Brown, PT  Brown, Teresa Krall 06/26/2020, 4:52 PM  [facility] [address] , [place], [address] Phone: [phone]   Fax:  [phone]  Name: Felicia [name] MRN: [id number] Date of Birth: 06/23/1950    

## 2020-07-14 ENCOUNTER — Inpatient Hospital Stay: Payer: Medicare Other | Attending: Oncology

## 2020-07-14 ENCOUNTER — Other Ambulatory Visit: Payer: Self-pay

## 2020-07-14 ENCOUNTER — Other Ambulatory Visit: Payer: Self-pay | Admitting: Adult Health

## 2020-07-14 ENCOUNTER — Inpatient Hospital Stay: Payer: Medicare Other

## 2020-07-14 VITALS — BP 113/63 | HR 75 | Temp 98.7°F | Resp 18 | Wt 164.0 lb

## 2020-07-14 DIAGNOSIS — Z79899 Other long term (current) drug therapy: Secondary | ICD-10-CM | POA: Diagnosis not present

## 2020-07-14 DIAGNOSIS — R059 Cough, unspecified: Secondary | ICD-10-CM

## 2020-07-14 DIAGNOSIS — R0602 Shortness of breath: Secondary | ICD-10-CM | POA: Insufficient documentation

## 2020-07-14 DIAGNOSIS — Z170 Estrogen receptor positive status [ER+]: Secondary | ICD-10-CM | POA: Insufficient documentation

## 2020-07-14 DIAGNOSIS — Z5112 Encounter for antineoplastic immunotherapy: Secondary | ICD-10-CM | POA: Diagnosis not present

## 2020-07-14 DIAGNOSIS — Z923 Personal history of irradiation: Secondary | ICD-10-CM | POA: Diagnosis not present

## 2020-07-14 DIAGNOSIS — Z808 Family history of malignant neoplasm of other organs or systems: Secondary | ICD-10-CM | POA: Insufficient documentation

## 2020-07-14 DIAGNOSIS — Z95828 Presence of other vascular implants and grafts: Secondary | ICD-10-CM

## 2020-07-14 DIAGNOSIS — Z833 Family history of diabetes mellitus: Secondary | ICD-10-CM | POA: Diagnosis not present

## 2020-07-14 DIAGNOSIS — Z90722 Acquired absence of ovaries, bilateral: Secondary | ICD-10-CM | POA: Diagnosis not present

## 2020-07-14 DIAGNOSIS — R5383 Other fatigue: Secondary | ICD-10-CM | POA: Diagnosis not present

## 2020-07-14 DIAGNOSIS — Z803 Family history of malignant neoplasm of breast: Secondary | ICD-10-CM | POA: Diagnosis not present

## 2020-07-14 DIAGNOSIS — C50412 Malignant neoplasm of upper-outer quadrant of left female breast: Secondary | ICD-10-CM | POA: Diagnosis present

## 2020-07-14 DIAGNOSIS — E109 Type 1 diabetes mellitus without complications: Secondary | ICD-10-CM | POA: Insufficient documentation

## 2020-07-14 DIAGNOSIS — Z818 Family history of other mental and behavioral disorders: Secondary | ICD-10-CM | POA: Insufficient documentation

## 2020-07-14 DIAGNOSIS — Z8249 Family history of ischemic heart disease and other diseases of the circulatory system: Secondary | ICD-10-CM | POA: Insufficient documentation

## 2020-07-14 DIAGNOSIS — I2510 Atherosclerotic heart disease of native coronary artery without angina pectoris: Secondary | ICD-10-CM | POA: Diagnosis not present

## 2020-07-14 LAB — CBC WITH DIFFERENTIAL (CANCER CENTER ONLY)
Abs Immature Granulocytes: 0.03 10*3/uL (ref 0.00–0.07)
Basophils Absolute: 0.1 10*3/uL (ref 0.0–0.1)
Basophils Relative: 1 %
Eosinophils Absolute: 0.1 10*3/uL (ref 0.0–0.5)
Eosinophils Relative: 1 %
HCT: 43.6 % (ref 36.0–46.0)
Hemoglobin: 14.5 g/dL (ref 12.0–15.0)
Immature Granulocytes: 0 %
Lymphocytes Relative: 34 %
Lymphs Abs: 3.4 10*3/uL (ref 0.7–4.0)
MCH: 31.3 pg (ref 26.0–34.0)
MCHC: 33.3 g/dL (ref 30.0–36.0)
MCV: 94 fL (ref 80.0–100.0)
Monocytes Absolute: 0.9 10*3/uL (ref 0.1–1.0)
Monocytes Relative: 9 %
Neutro Abs: 5.6 10*3/uL (ref 1.7–7.7)
Neutrophils Relative %: 55 %
Platelet Count: 194 10*3/uL (ref 150–400)
RBC: 4.64 MIL/uL (ref 3.87–5.11)
RDW: 15.9 % — ABNORMAL HIGH (ref 11.5–15.5)
WBC Count: 10 10*3/uL (ref 4.0–10.5)
nRBC: 0 % (ref 0.0–0.2)

## 2020-07-14 LAB — CMP (CANCER CENTER ONLY)
ALT: 44 U/L (ref 0–44)
AST: 40 U/L (ref 15–41)
Albumin: 3.7 g/dL (ref 3.5–5.0)
Alkaline Phosphatase: 78 U/L (ref 38–126)
Anion gap: 14 (ref 5–15)
BUN: 12 mg/dL (ref 8–23)
CO2: 24 mmol/L (ref 22–32)
Calcium: 10.4 mg/dL — ABNORMAL HIGH (ref 8.9–10.3)
Chloride: 102 mmol/L (ref 98–111)
Creatinine: 0.75 mg/dL (ref 0.44–1.00)
GFR, Estimated: >60 mL/min (ref >60)
Glucose, Bld: 199 mg/dL — ABNORMAL HIGH (ref 70–99)
Potassium: 4 mmol/L (ref 3.5–5.1)
Sodium: 140 mmol/L (ref 135–145)
Total Bilirubin: 0.4 mg/dL (ref 0.3–1.2)
Total Protein: 7.7 g/dL (ref 6.5–8.1)

## 2020-07-14 MED ORDER — SODIUM CHLORIDE 0.9% FLUSH
10.0000 mL | Freq: Once | INTRAVENOUS | Status: AC
  Administered 2020-07-14: 10 mL
  Filled 2020-07-14: qty 10

## 2020-07-14 MED ORDER — ACETAMINOPHEN 325 MG PO TABS
ORAL_TABLET | ORAL | Status: AC
  Filled 2020-07-14: qty 2

## 2020-07-14 MED ORDER — LORAZEPAM 2 MG/ML IJ SOLN
0.5000 mg | Freq: Once | INTRAMUSCULAR | Status: AC
  Administered 2020-07-14: 0.5 mg via INTRAVENOUS

## 2020-07-14 MED ORDER — SODIUM CHLORIDE 0.9 % IV SOLN
Freq: Once | INTRAVENOUS | Status: AC
Start: 2020-07-14 — End: 2020-07-14
  Filled 2020-07-14: qty 250

## 2020-07-14 MED ORDER — DIPHENHYDRAMINE HCL 25 MG PO CAPS
ORAL_CAPSULE | ORAL | Status: AC
  Filled 2020-07-14: qty 1

## 2020-07-14 MED ORDER — HEPARIN SOD (PORK) LOCK FLUSH 100 UNIT/ML IV SOLN
500.0000 [IU] | Freq: Once | INTRAVENOUS | Status: AC | PRN
  Administered 2020-07-14: 500 [IU]
  Filled 2020-07-14: qty 5

## 2020-07-14 MED ORDER — METHYLPREDNISOLONE 4 MG PO TBPK: ORAL_TABLET | ORAL | 0 refills | Status: DC

## 2020-07-14 MED ORDER — LORAZEPAM 2 MG/ML IJ SOLN
INTRAMUSCULAR | Status: AC
  Filled 2020-07-14: qty 1

## 2020-07-14 MED ORDER — SODIUM CHLORIDE 0.9 % IV SOLN
3.6000 mg/kg | Freq: Once | INTRAVENOUS | Status: AC
  Administered 2020-07-14: 280 mg via INTRAVENOUS
  Filled 2020-07-14: qty 14

## 2020-07-14 MED ORDER — ACETAMINOPHEN 325 MG PO TABS
650.0000 mg | ORAL_TABLET | Freq: Once | ORAL | Status: AC
Start: 2020-07-14 — End: 2020-07-14
  Administered 2020-07-14: 650 mg via ORAL

## 2020-07-14 MED ORDER — DIPHENHYDRAMINE HCL 25 MG PO CAPS
25.0000 mg | ORAL_CAPSULE | Freq: Once | ORAL | Status: AC
Start: 2020-07-14 — End: 2020-07-14
  Administered 2020-07-14: 25 mg via ORAL

## 2020-07-14 MED ORDER — ALBUTEROL SULFATE HFA 108 (90 BASE) MCG/ACT IN AERS: 2.0000 | INHALATION_SPRAY | Freq: Four times a day (QID) | RESPIRATORY_TRACT | 2 refills | Status: DC | PRN

## 2020-07-14 MED ORDER — SODIUM CHLORIDE 0.9% FLUSH
10.0000 mL | INTRAVENOUS | Status: DC | PRN
  Administered 2020-07-14: 10 mL
  Filled 2020-07-14: qty 10

## 2020-07-14 NOTE — Patient Instructions (Signed)
[facility]   °Discharge Instructions: °Thank you for choosing [facility] to provide your oncology and hematology care.  ° °If you have a lab appointment with the Cancer Center, please go directly to the Cancer Center and check in at the registration area. °  °Wear comfortable clothing and clothing appropriate for easy access to any Portacath or PICC line.  ° °We strive to give you quality time with your provider. You may need to reschedule your appointment if you arrive late (15 or more minutes).  Arriving late affects you and other patients whose appointments are after yours.  Also, if you miss three or more appointments without notifying the office, you may be dismissed from the clinic at the provider’s discretion.    °  °For prescription refill requests, have your pharmacy contact our office and allow 72 hours for refills to be completed.   ° °Today you received the following chemotherapy and/or immunotherapy agents: ado-trastuzumab emtansine    °  °To help prevent nausea and vomiting after your treatment, we encourage you to take your nausea medication as directed. ° °BELOW ARE SYMPTOMS THAT SHOULD BE REPORTED IMMEDIATELY: °*FEVER GREATER THAN 100.4 F (38 °C) OR HIGHER °*CHILLS OR SWEATING °*NAUSEA AND VOMITING THAT IS NOT CONTROLLED WITH YOUR NAUSEA MEDICATION °*UNUSUAL SHORTNESS OF BREATH °*UNUSUAL BRUISING OR BLEEDING °*URINARY PROBLEMS (pain or burning when urinating, or frequent urination) °*BOWEL PROBLEMS (unusual diarrhea, constipation, pain near the anus) °TENDERNESS IN MOUTH AND THROAT WITH OR WITHOUT PRESENCE OF ULCERS (sore throat, sores in mouth, or a toothache) °UNUSUAL RASH, SWELLING OR PAIN  °UNUSUAL VAGINAL DISCHARGE OR ITCHING  ° °Items with * indicate a potential emergency and should be followed up as soon as possible or go to the Emergency Department if any problems should occur. ° °Please show the CHEMOTHERAPY ALERT CARD or IMMUNOTHERAPY ALERT  CARD at check-in to the Emergency Department and triage nurse. ° °Should you have questions after your visit or need to cancel or reschedule your appointment, please contact [facility]  Dept: [phone]  and follow the prompts.  Office hours are 8:00 a.m. to 4:30 p.m. Monday - Friday. Please note that voicemails left after 4:00 p.m. may not be returned until the following business day.  We are closed weekends and major holidays. You have access to a nurse at all times for urgent questions. Please call the main number to the clinic Dept: [phone] and follow the prompts. ° ° °For any non-urgent questions, you may also contact your provider using MyChart. We now offer e-Visits for anyone 18 and older to request care online for non-urgent symptoms. For details visit [email or web address]. °  °Also download the MyChart app! Go to the app store, search "MyChart", open the app, select [facility], and log in with your MyChart username and password. ° °Due to Covid, a mask is required upon entering the hospital/clinic. If you do not have a mask, one will be given to you upon arrival. For doctor visits, patients may have 1 support person aged 18 or older with them. For treatment visits, patients cannot have anyone with them due to current Covid guidelines and our immunocompromised population.  ° °

## 2020-07-14 NOTE — Progress Notes (Signed)
I met with Felicia [name] briefly to review her pain.  It is unchanged.  She is having atypical chest pain that has been present since starting radiation.  She has undergone a full cardiac work up.  She underwent chest xray and CTA that were negative for pneumonia, infiltrate, or PE.     I recommended that she consider repeating chest xray or getting a CT chest.  She wants to hold off on this since she things her cough and osme of the pain is related to the emphysema noted on the CTA.  She does not feel like this is worsening than when she first developed this in April.  She is going to see pulmonology on 7/18.  In the interim we will place orders for a medrol dosepak and albuterol inhaler.    Wilber Bihari, NP

## 2020-07-14 NOTE — Patient Instructions (Signed)

## 2020-07-18 NOTE — Progress Notes (Signed)
Felicia [name], it looks like GM is on PAL that week and I think we will have to leave her apt as is right now with you on 7/22.

## 2020-07-19 ENCOUNTER — Encounter: Payer: Self-pay | Admitting: Oncology

## 2020-07-19 NOTE — Progress Notes (Signed)
                                                                                                                                                             Patient Name: Felicia [name] MRN: [id number] DOB: 11/09/1950 Referring Physician: Lurline Del (Profile Not Attached) Date of Service: 05/12/2020 [facility], [place]                                                        End Of Treatment Note  Diagnoses: C50.412-Malignant neoplasm of upper-outer quadrant of left female breast  Cancer Staging: Cancer Staging Malignant neoplasm of upper-outer quadrant of left breast in female, estrogen receptor positive ([place]) Staging form: Breast, AJCC 8th Edition - Clinical stage from 09/08/2019: Stage IA (cT1c, cN0, cM0, G2, ER+, PR+, HER2+) - Unsigned Stage prefix: Initial diagnosis Histologic grading system: 3 grade system  pT1c pN1a  Intent: Curative  Radiation Treatment Dates: 03/30/2020 through 05/12/2020 Site Technique Total Dose (Gy) Dose per Fx (Gy) Completed Fx Beam Energies  Breast, Left: Breast_Lt 3D 50/50 2 25/25 10X  Breast, Left: Breast_Lt_SCV_PAB 3D 50/50 2 25/25 10X, 15X  Breast, Left: Breast_Lt_Bst 3D 10/10 2 5/5 6X, 10X   Narrative: The patient tolerated radiation therapy relatively well.   Plan: The patient will follow-up with radiation oncology in 1 month.  -----------------------------------  Eppie Gibson, MD

## 2020-07-27 ENCOUNTER — Other Ambulatory Visit: Payer: Self-pay | Admitting: Oncology

## 2020-07-27 NOTE — Progress Notes (Signed)
[facility]  [phone] [phone]     ID: Felicia [name] DOB: [birth year]  MR#: [id number]  TIW#:[id number]  Patient Care Team: Kathyrn Lass, MD as PCP - General (Family Medicine) Nahser, [name], MD as PCP - Cardiology (Cardiology) Mauro Kaufmann, RN as Oncology Nurse Navigator Rockwell Germany, RN as Oncology Nurse Navigator Rolm Bookbinder, MD as Consulting Physician (General Surgery) Magrinat, Virgie Dad, MD as Consulting Physician (Oncology) Eppie Gibson, MD as Attending Physician (Radiation Oncology) [name], MD as Consulting Physician (Rheumatology) [name], MD as Consulting Physician (Orthopedic Surgery) Janyth Pupa, DO as Consulting Physician (Obstetrics and Gynecology) Larey Dresser, MD as Consulting Physician (Cardiology) Madelin Rear, MD as Consulting Physician (Endocrinology) Magrinat, Virgie Dad, MD as Consulting Physician (Oncology) Maryanna Shape, NP as Nurse Practitioner (Oncology) Scot Dock, NP OTHER MD:  CHIEF COMPLAINT: Triple positive invasive breast cancer  CURRENT TREATMENT: T-DM1   INTERVAL HISTORY: [name] returns today for follow up and treatment of her triple positive invasive breast cancer.    She is receiving TDM-1 every 3 weeks and tolerates it well.  She continues to have persistent shortness of breath.  She saw pulmonology on 07/31/2020.  She was started on a long acting inhaler and has noted no improvement as of yet.  She is going to undergo PFTs soon.    Her most recent echocardiogram from 05/17/2020/2022 showed a normal EF of 60-65%.  She has a repeat echocardiogram scheduled on 08/14/2020.    REVIEW OF SYSTEMS: Review of Systems  Constitutional:  Positive for fatigue. Negative for appetite change, chills, fever and unexpected weight change.  HENT:   Negative for hearing loss, lump/mass and trouble swallowing.   Eyes:  Negative for eye problems and icterus.  Respiratory:   Positive for shortness of breath. Negative for chest tightness and cough.   Cardiovascular:  Negative for chest pain, leg swelling and palpitations.  Gastrointestinal:  Negative for abdominal distention, abdominal pain, constipation, diarrhea, nausea and vomiting.  Endocrine: Negative for hot flashes.  Genitourinary:  Negative for difficulty urinating.   Musculoskeletal:  Negative for arthralgias.  Skin:  Negative for itching and rash.  Neurological:  Negative for dizziness, extremity weakness, headaches and numbness.  Hematological:  Negative for adenopathy. Does not bruise/bleed easily.  Psychiatric/Behavioral:  Negative for depression. The patient is not nervous/anxious.      COVID 19 VACCINATION STATUS: Refuses vaccination   HISTORY OF CURRENT ILLNESS: From the original intake note:  Felicia [name] had routine screening mammography on 08/03/2019 showing a possible abnormality in the left breast. She underwent left diagnostic mammography with tomography and left breast ultrasonography at [facility] on 08/17/2019 showing: breast density category B; 1.6 cm upper-outer left breast mass; no significant left axillary abnormalities.  Accordingly on 08/30/2019 she proceeded to biopsy of the left breast area in question. The pathology from this procedure ([id number]) showed: invasive mammary carcinoma, e-cadherin positive, grade 2. Prognostic indicators significant for: estrogen receptor, 100% positive and progesterone receptor, 40% positive, both with strong staining intensity. Proliferation marker Ki67 at 20%. HER2 equivocal by immunohistochemistry (2+), but negative by fluorescent in situ hybridization with a signals ratio   and number per cell  .  Of note, she also has a history of FIGO grade 3, stage IA endometrial carcinoma, diagnosed in 2014 in [place]. She underwent robotic assisted total hysterectomy with BSO and pelvic and perirectal aortic lymph node dissection as well as omental biopsy.   Tumor was found only in the  endometrium arising in a polyp measuring 1.7 cm.  There was no myometrial invasion no lymphovascular space invasion, and no lower uterine segment involvement.  The cervix and adnexa, lymph nodes and omentum were all not involved.  She then received vaginal brachytherapy, no chemotherapy, completing treatment in the springtime of 2015. [Per patient's report, chemotherapy was suggested, but she opted against this.]  The patient's subsequent history is as detailed below.   PAST MEDICAL HISTORY: Past Medical History:  Diagnosis Date   Anemia    Anxiety    Breast cancer ([place]) 09/2019   left breast IMC   CAD (coronary artery disease)    a. 3/2007s/p DES to the LAD [facility]); b. 01/2017 MV: EF 80%, small, mild apical ant defect w/o ischemia (felt to be breast atten). Low risk.   Depression    DM (diabetes mellitus) ([place])    Essential hypertension    GERD (gastroesophageal reflux disease)    Headache    secondary to neck surgery per patient   High triglycerides    Hyperlipidemia    Hypothyroid    OA (osteoarthritis)    right knee,hands   Overflow incontinence    PONV (postoperative nausea and vomiting)    s/p gallbladder    RLS (restless legs syndrome)    Urinary urgency    Uterine cancer ([place]) dx'd 2014    PAST SURGICAL HISTORY: Past Surgical History:  Procedure Laterality Date   BREAST LUMPECTOMY WITH RADIOACTIVE SEED AND SENTINEL LYMPH NODE BIOPSY Left 10/07/2019   Procedure: LEFT BREAST LUMPECTOMY WITH RADIOACTIVE SEED AND SENTINEL LYMPH NODE BIOPSY;  Surgeon: Rolm Bookbinder, MD;  Location: [facility];  Service: General;  Laterality: Left;   CORONARY STENT PLACEMENT     CORONARY/GRAFT ACUTE MI REVASCULARIZATION     FINGER ARTHRODESIS Right 06/11/2019   Procedure: ARTHRODESIS INDEX FINGER DISTAL PHALANGEAL JOINT;  Surgeon: [name], MD;  Location: [facility];  Service: Orthopedics;  Laterality: Right;   FOOT  SURGERY Left    LEFT HEART CATH AND CORONARY ANGIOGRAPHY N/A 02/16/2018   Procedure: LEFT HEART CATH AND CORONARY ANGIOGRAPHY;  Surgeon: Lorretta Harp, MD;  Location: [facility] CV [facility];  Service: Cardiovascular;  Laterality: N/A;   LEFT HEART CATH AND CORONARY ANGIOGRAPHY N/A 05/01/2020   Procedure: LEFT HEART CATH AND CORONARY ANGIOGRAPHY;  Surgeon: Lorretta Harp, MD;  Location: [place] [facility];  Service: Cardiovascular;  Laterality: N/A;   PORTACATH PLACEMENT Right 11/23/2019   Procedure: INSERTION PORT-A-CATH WITH ULTRASOUND GUIDANCE;  Surgeon: Rolm Bookbinder, MD;  Location: [facility];  Service: General;  Laterality: Right;   REPLACEMENT TOTAL KNEE Right    SPINE SURGERY     TOTAL ABDOMINAL HYSTERECTOMY     FOR UTERUS CANCER   TOTAL HIP ARTHROPLASTY Left     FAMILY HISTORY: Family History  Problem Relation Age of Onset   AAA (abdominal aortic aneurysm) Mother    Diabetes Father    Alzheimer's disease Father    Throat cancer Sister    Breast cancer Cousin    Her father died at age 14 from Alzheimer's. Her mother died at age 62 from a ruptured abdominal aneurysm. Lyndie has 2 brothers and 3 sisters. She reports breast cancer in a paternal cousin. She also notes throat cancer in her sister.   GYNECOLOGIC HISTORY:  No LMP recorded. Patient has had a hysterectomy. Menarche: 70 years old Age at first live birth: 70 years old [place] P 2 LMP 1996 Contraceptive:  prior use without issue HRT never used  Hysterectomy? Yes, 2014 BSO? 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  She unpacks 40 pounds boxes of chicken, has to cook them in 13 pound cooking pans, and is generally on her feet at work but does not otherwise exercise.  She is divorced. She lives at home by herself, with no pets. Daughter Pershing Proud, age 66, is a radiology tech in [place]. Daughter Martyn Malay, age 42, is an Optometrist in  [place], [place]. Telisha has one grandchild. She is not a Ambulance person.    ADVANCED DIRECTIVES: Not in place; intends to name daughter [name] as her HCPOA.   HEALTH MAINTENANCE: Social History   Tobacco Use   Smoking status: Never   Smokeless tobacco: Never  Vaping Use   Vaping Use: Never used  Substance Use Topics   Alcohol use: Yes    Comment: OCCASIONALLY   Drug use: No     [place]: <10 years ago, [place]  PAP: 2014?  Bone density: date unsure, also in [place]   Allergies  Allergen Reactions   Chloraprep One Step [Chlorhexidine Gluconate] Rash   Estrogens Other (See Comments)    PATIENT HAS A HISTORY OF CANCER AND HAS BEEN TOLD TO NEVER TAKE ANYTHING CONTAINING ESTROGEN, AS IT MIGHT CAUSE A RECURRENCE   Shrimp [Shellfish Allergy] Nausea And Vomiting    Current Outpatient Medications  Medication Sig Dispense Refill   ACCU-CHEK GUIDE test strip 1 each by Other route as needed.     albuterol (VENTOLIN HFA) 108 (90 Base) MCG/ACT inhaler Inhale 2 puffs into the lungs every 6 (six) hours as needed for wheezing or shortness of breath. 8 g 2   Ascorbic Acid (VITAMIN C PO) Take 1 tablet by mouth daily.     aspirin EC 81 MG tablet Take 81 mg by mouth daily.     atorvastatin (LIPITOR) 40 MG tablet TAKE 1 TABLET BY MOUTH EVERY DAY 90 tablet 1   bisoprolol (ZEBETA) 5 MG tablet TAKE 1 TABLET BY MOUTH EVERY DAY 90 tablet 2   Calcium Citrate-Vitamin D (CALCIUM + D PO) Take 1 tablet by mouth daily with supper.     CINNAMON PO Take 1 capsule by mouth daily with supper.     diclofenac sodium (VOLTAREN) 1 % GEL Apply 2-4 g topically 4 (four) times daily as needed (as directed for pain).     Dulaglutide (TRULICITY) 1.5 AY/3.0ZS SOPN Inject 1.5 mg into the skin once a week.     escitalopram (LEXAPRO) 10 MG tablet Take 10 mg by mouth at bedtime.     Ferrous Sulfate (IRON PO) Take 1 tablet by mouth daily. alternates days:1 tablet one day and 2 tablets the next day     fluticasone-salmeterol  (ADVAIR HFA) 115-21 MCG/ACT inhaler Inhale 2 puffs into the lungs 2 (two) times daily. 1 each 6   gabapentin (NEURONTIN) 300 MG capsule Take 1 capsule (300 mg total) by mouth at bedtime. 90 capsule 4   GLIPIZIDE XL 10 MG 24 hr tablet Take 20 mg by mouth daily.   3   JARDIANCE 25 MG TABS tablet Take 25 mg by mouth every morning.   3   levothyroxine (SYNTHROID) 137 MCG tablet Take 137 mcg by mouth every morning.     metFORMIN (GLUCOPHAGE) 500 MG tablet Take 500 mg by mouth 2 (two) times daily with a meal.     metroNIDAZOLE (METROGEL) 1 %  gel Apply 1 application topically as needed (as directed to affected area). 45 g 2   Multiple Vitamins-Calcium (ONE-A-DAY WOMENS PO) Take 1 tablet by mouth daily.     nitroGLYCERIN (NITROSTAT) 0.4 MG SL tablet Place 1 tablet (0.4 mg total) under the tongue every 5 (five) minutes as needed for chest pain. 25 tablet 6   omeprazole (PRILOSEC) 40 MG capsule Take 40 mg by mouth daily with supper.      prochlorperazine (COMPAZINE) 10 MG tablet TAKE 1 TABLET BY MOUTH BEFORE MEALS AND AT BEDTIME ON DAYS 2 AND 3 AFTER CHEMOTHERAPY (COUNTING CHEMOTHERAPY DAY AS DAY 1) AFTER THAT MAY TAKE AS NEEDED 30 tablet 1   rOPINIRole (REQUIP) 0.5 MG tablet Take 0.5 mg by mouth at bedtime. 1-3 hours prior to bedtime     tiZANidine (ZANAFLEX) 4 MG tablet Take 4 mg by mouth every 8 (eight) hours as needed for muscle spasms. As needed     traMADol (ULTRAM) 50 MG tablet Take 1 tablet (50 mg total) by mouth every 6 (six) hours as needed for moderate pain. 30 tablet 0   TURMERIC PO Take 1 capsule by mouth daily with supper.     No current facility-administered medications for this visit.    OBJECTIVE:  Vitals:   08/04/20 1011  BP: 125/69  Pulse: 81  Resp: 18  Temp: 97.8 F (36.6 C)  SpO2: 96%     Body mass index is 28.21 kg/m.   Wt Readings from Last 3 Encounters:  08/04/20 164 lb 6 oz (74.6 kg)  07/31/20 162 lb (73.5 kg)  07/14/20 164 lb (74.4 kg)  ECOG FS:1 - Symptomatic but  completely ambulatory GENERAL: Patient is a well appearing female in no acute distress HEENT:  Sclerae anicteric.  Mask in place. Neck is supple.  NODES:  No cervical, supraclavicular, or axillary lymphadenopathy palpated.  BREAST EXAM:  deferred today LUNGS:  Clear to auscultation bilaterally.  No wheezes or rhonchi. HEART:  Regular rate and rhythm. No murmur appreciated. ABDOMEN:  Soft, nontender.  Positive, normoactive bowel sounds. No organomegaly palpated. MSK:  No focal spinal tenderness to palpation. Full range of motion bilaterally in the upper extremities. EXTREMITIES:  No peripheral edema.   SKIN:  Clear with no obvious rashes or skin changes. No nail dyscrasia. NEURO:  Nonfocal. Well oriented.  Appropriate affect.     LAB RESULTS:  CMP     Component Value Date/Time   NA 140 07/14/2020 1114   NA 139 11/25/2018 1053   K 4.0 07/14/2020 1114   CL 102 07/14/2020 1114   CO2 24 07/14/2020 1114   GLUCOSE 199 (H) 07/14/2020 1114   BUN 12 07/14/2020 1114   BUN 18 11/25/2018 1053   CREATININE 0.75 07/14/2020 1114   CALCIUM 10.4 (H) 07/14/2020 1114   PROT 7.7 07/14/2020 1114   PROT 7.1 11/25/2018 1053   ALBUMIN 3.7 07/14/2020 1114   ALBUMIN 4.2 11/25/2018 1053   AST 40 07/14/2020 1114   ALT 44 07/14/2020 1114   ALKPHOS 78 07/14/2020 1114   BILITOT 0.4 07/14/2020 1114   GFRNONAA >60 07/14/2020 1114   GFRAA >60 10/04/2019 1330   GFRAA >60 09/08/2019 0811    No results found for: TOTALPROTELP, ALBUMINELP, A1GS, A2GS, BETS, BETA2SER, GAMS, MSPIKE, SPEI  Lab Results  Component Value Date   WBC 8.2 08/04/2020   NEUTROABS 3.7 08/04/2020   HGB 14.0 08/04/2020   HCT 42.5 08/04/2020   MCV 94.7 08/04/2020   PLT 167 08/04/2020  No results found for: LABCA2  No components found for: [id number]  No results for input(s): INR in the last 168 hours.  No results found for: LABCA2  No results found for: KZL935  No results found for: TSV779  No results found for:  TJQ300  No results found for: CA2729  No components found for: HGQUANT  No results found for: CEA1 / No results found for: CEA1   No results found for: AFPTUMOR  No results found for: CHROMOGRNA  No results found for: KPAFRELGTCHN, LAMBDASER, KAPLAMBRATIO (kappa/lambda light chains)  No results found for: HGBA, HGBA2QUANT, HGBFQUANT, HGBSQUAN (Hemoglobinopathy evaluation)   No results found for: LDH  No results found for: IRON, TIBC, IRONPCTSAT (Iron and TIBC)  No results found for: FERRITIN  Urinalysis No results found for: COLORURINE, APPEARANCEUR, LABSPEC, PHURINE, GLUCOSEU, HGBUR, BILIRUBINUR, KETONESUR, PROTEINUR, UROBILINOGEN, NITRITE, LEUKOCYTESUR   STUDIES: No results found.   ELIGIBLE FOR AVAILABLE RESEARCH PROTOCOL: AET  ASSESSMENT: 70 y.o. [place] woman status post left breast upper outer quadrant biopsy 08/30/2019 for a clinical T1c N0, stage IA invasive ductal carcinoma, grade 2, estrogen and progesterone receptor positive, HER-2 amplified, with an MIB-1 of 20%.  (0) status post TAH/BSO 2014 for a FIGO grade 3, stage IA endometrial carcinoma, status post vaginal brachytherapy, no adjuvant chemotherapy  (1) status post left lumpectomy and axillary sentinel node sampling 10/07/2019 for a pT1c pN1, stage IB invasive ductal carcinoma, grade 2, with negative margins.  (a) a total of 1 sentinel lymph node was removed.  (2) adjuvant chemotherapy consisting of carboplatin, gemcitabine, trastuzumab given every 3 weeks x 6 started 11/24/2019, last dose 01/26/2020  (a) baseline echo 09/15/2019 shows an ejection fraction in the 60-65% range.  (b) will not use docetaxel secondary to severe peripheral neuropathy present at baseline  (c) echo on 01/17/2020 shows EF of 60-65%  (d) chemotherapy discontinued after 4 cycles with poor tolerance.  (3) T-DM1 started 02/16/2020, to be continued through November 2022  (a) echo 5/24 EF 60-65%  (B) echo on 08/14/2020  (4)  adjuvant radiation completed in 03/30/2020-05/12/2020 Site Technique Total Dose (Gy) Dose per Fx (Gy) Completed Fx Beam Energies  Breast, Left: Breast_Lt 3D 50/50 2 25/25 10X  Breast, Left: Breast_Lt_SCV_PAB 3D 50/50 2 25/25 10X, 15X  Breast, Left: Breast_Lt_Bst 3D 10/10 2 5/5 6X, 10X    (5) antiestrogens to start at the completion of local treatment   PLAN: Fadumo is here today for continued treatment with TDM1 for her triple positive breast cancer.  She is tolerating treatment well.    I let her know that Dr. Jana Hakim and I discussed her shortness of breath and our recommendation is that she undergo a chest xray PRIOR to receiving TDM1 to rule out treatment related pneumonitis.  I placed orders for a STAT chest xray and asked our nursing tech to take her to radiology to have the xray completed before her infusion.  Patient and tech verbalized understanding.    Sritha has appointments to return every three weeks for TDM1 and every 6 weeks for a visit with [place].    I spent approximately 30 minutes reviewing the assessment plan with patient, recommendations, and in the visit.  I also spent that time documenting the note above.    Wilber Bihari, NP 08/04/20 10:50 AM Medical Oncology and Hematology [facility] [place], [place] [address] Tel. [phone]    Fax. [phone]  Addendum: Patient unfortunately did not undergo chest xray prior to treatment and  I was unaware.  She received her treatment today and has the xray scheduled at 145pm.  Will await results from radiology.  *Total Encounter Time as defined by the Centers for Medicare and Medicaid Services includes, in addition to the face-to-face time of a patient visit (documented in the note above) non-face-to-face time: obtaining and reviewing outside history, ordering and reviewing medications, tests or procedures, care coordination (communications with other health care professionals or caregivers) and  documentation in the medical record.

## 2020-07-31 ENCOUNTER — Encounter: Payer: Self-pay | Admitting: Pulmonary Disease

## 2020-07-31 ENCOUNTER — Other Ambulatory Visit: Payer: Self-pay

## 2020-07-31 ENCOUNTER — Ambulatory Visit (INDEPENDENT_AMBULATORY_CARE_PROVIDER_SITE_OTHER): Payer: Medicare Other | Admitting: Pulmonary Disease

## 2020-07-31 VITALS — BP 116/68 | HR 92 | Ht 64.0 in | Wt 162.0 lb

## 2020-07-31 DIAGNOSIS — R0602 Shortness of breath: Secondary | ICD-10-CM

## 2020-07-31 DIAGNOSIS — J432 Centrilobular emphysema: Secondary | ICD-10-CM | POA: Diagnosis not present

## 2020-07-31 MED ORDER — FLUTICASONE-SALMETEROL 115-21 MCG/ACT IN AERO: 2.0000 | INHALATION_SPRAY | Freq: Two times a day (BID) | RESPIRATORY_TRACT | 6 refills | Status: DC

## 2020-07-31 NOTE — Progress Notes (Signed)
Subjective:   PATIENT ID: Felicia [name]: female DOB: 01-Mar-1950, MRN: [id number]   HPI  Chief Complaint  Patient presents with   Consult    Shortness of breath and chest pain    Reason for Visit: New consult for asthma  [name] is a 70 year old female never smoker with left breast cancer s/p radiation, CAD, DM and HTN who presents for new consult.  After completing one month after radiation, she reports having left sided chest pain/pressure with radiation to the the back associated with shortness of breath. She feels she cannot get a full breath and can be tight. Associated with dry cough. Denies chest congestion. Most nights a week she has discomfort related to this pain that will awaken. Denies bilateral chest pain. Talking will get her winded.  Denies any respiratory symptoms prior to radiation symptoms. Since treatment she has a difficult time to catching her breath.   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  Social History: Second hand smoke exposure Reports she was born two months early  I have personally reviewed patient's past medical/family/social history, allergies, current medications  Past Medical History:  Diagnosis Date   Anemia    Anxiety    Breast cancer ([place]) 09/2019   left breast IMC   CAD (coronary artery disease)    a. 3/2007s/p DES to the LAD [facility]); b. 01/2017 MV: EF 80%, small, mild apical ant defect w/o ischemia (felt to be breast atten). Low risk.   Depression    DM (diabetes mellitus) (HCC)    Essential hypertension    GERD (gastroesophageal reflux disease)    Headache    secondary to neck surgery per patient   High triglycerides    Hyperlipidemia    Hypothyroid    OA (osteoarthritis)    right  knee,hands   Overflow incontinence    PONV (postoperative nausea and vomiting)    s/p gallbladder    RLS (restless legs syndrome)    Urinary urgency    Uterine cancer ([place]) dx'd 2014     Family History  Problem Relation Age of Onset   AAA (abdominal aortic aneurysm) Mother    Diabetes Father    Alzheimer's disease Father    Throat cancer Sister    Breast cancer Cousin      Social History   Occupational History   Not on file  Tobacco Use   Smoking status: Never   Smokeless tobacco: Never  Vaping Use   Vaping Use: Never used  Substance and Sexual Activity   Alcohol use: Yes    Comment: OCCASIONALLY   Drug use: No   Sexual activity: Not Currently    Birth control/protection: Surgical    Comment: hyst    Allergies  Allergen Reactions   Chloraprep One Step [Chlorhexidine Gluconate] Rash   Estrogens Other (See Comments)    PATIENT HAS A HISTORY OF CANCER AND HAS BEEN TOLD TO NEVER TAKE ANYTHING CONTAINING ESTROGEN, AS IT MIGHT CAUSE A RECURRENCE   Shrimp [Shellfish Allergy] Nausea And Vomiting     Outpatient Medications Prior to Visit  Medication Sig Dispense Refill   ACCU-CHEK GUIDE test strip 1 each by Other route as needed.     albuterol (VENTOLIN HFA) 108 (90 Base)  MCG/ACT inhaler Inhale 2 puffs into the lungs every 6 (six) hours as needed for wheezing or shortness of breath. 8 g 2   Ascorbic Acid (VITAMIN C PO) Take 1 tablet by mouth daily.     aspirin EC 81 MG tablet Take 81 mg by mouth daily.     atorvastatin (LIPITOR) 40 MG tablet TAKE 1 TABLET BY MOUTH EVERY DAY 90 tablet 1   bisoprolol (ZEBETA) 5 MG tablet TAKE 1 TABLET BY MOUTH EVERY DAY 90 tablet 2   Calcium Citrate-Vitamin D (CALCIUM + D PO) Take 1 tablet by mouth daily with supper.     CINNAMON PO Take 1 capsule by mouth daily with supper.     diclofenac sodium (VOLTAREN) 1 % GEL Apply 2-4 g topically 4 (four) times daily as needed (as directed for pain).     Dulaglutide (TRULICITY) 1.5 WE/9.9BZ SOPN  Inject 1.5 mg into the skin once a week.     escitalopram (LEXAPRO) 10 MG tablet Take 10 mg by mouth at bedtime.     Ferrous Sulfate (IRON PO) Take 1 tablet by mouth daily. alternates days:1 tablet one day and 2 tablets the next day     gabapentin (NEURONTIN) 300 MG capsule Take 1 capsule (300 mg total) by mouth at bedtime. 90 capsule 4   GLIPIZIDE XL 10 MG 24 hr tablet Take 20 mg by mouth daily.   3   JARDIANCE 25 MG TABS tablet Take 25 mg by mouth every morning.   3   levothyroxine (SYNTHROID) 137 MCG tablet Take 137 mcg by mouth every morning.     metFORMIN (GLUCOPHAGE) 500 MG tablet Take 500 mg by mouth 2 (two) times daily with a meal.     methylPREDNISolone (MEDROL DOSEPAK) 4 MG TBPK tablet Taper 6,5,4,3,2,1 21 tablet 0   metroNIDAZOLE (METROGEL) 1 % gel Apply 1 application topically as needed (as directed to affected area). 45 g 2   Multiple Vitamins-Calcium (ONE-A-DAY WOMENS PO) Take 1 tablet by mouth daily.     nitroGLYCERIN (NITROSTAT) 0.4 MG SL tablet Place 1 tablet (0.4 mg total) under the tongue every 5 (five) minutes as needed for chest pain. 25 tablet 6   omeprazole (PRILOSEC) 40 MG capsule Take 40 mg by mouth daily with supper.      prochlorperazine (COMPAZINE) 10 MG tablet TAKE 1 TABLET BY MOUTH BEFORE MEALS AND AT BEDTIME ON DAYS 2 AND 3 AFTER CHEMOTHERAPY (COUNTING CHEMOTHERAPY DAY AS DAY 1) AFTER THAT MAY TAKE AS NEEDED 30 tablet 1   rOPINIRole (REQUIP) 0.5 MG tablet Take 0.5 mg by mouth at bedtime. 1-3 hours prior to bedtime     tiZANidine (ZANAFLEX) 4 MG tablet Take 4 mg by mouth every 8 (eight) hours as needed for muscle spasms. As needed     traMADol (ULTRAM) 50 MG tablet Take 1 tablet (50 mg total) by mouth every 6 (six) hours as needed for moderate pain. 30 tablet 0   TURMERIC PO Take 1 capsule by mouth daily with supper.     No facility-administered medications prior to visit.    Review of Systems  Constitutional:  Negative for chills, diaphoresis, fever,  malaise/fatigue and weight loss.  HENT:  Negative for congestion, ear pain and sore throat.   Respiratory:  Positive for cough and shortness of breath. Negative for hemoptysis, sputum production and wheezing.   Cardiovascular:  Positive for chest pain. Negative for palpitations and leg swelling.  Gastrointestinal:  Negative for abdominal pain, heartburn and nausea.  Genitourinary:  Negative for frequency.  Musculoskeletal:  Negative for joint pain and myalgias.  Skin:  Negative for itching and rash.  Neurological:  Negative for dizziness, weakness and headaches.  Endo/Heme/Allergies:  Does not bruise/bleed easily.  Psychiatric/Behavioral:  Negative for depression. The patient is not nervous/anxious.     Objective:   Vitals:   07/31/20 1328  BP: 116/68  Pulse: 92  SpO2: 95%  Weight: 162 lb (73.5 kg)  Height: 5\' 4"  (1.626 m)   SpO2: 95 %  Physical Exam: General: Well-appearing, no acute distress HENT: [place], AT Eyes: EOMI, no scleral icterus Respiratory: Clear to auscultation bilaterally.  No crackles, wheezing or rales Cardiovascular: RRR, -M/R/G, no JVD Extremities:-Edema,-tenderness Neuro: AAO x4, CNII-XII grossly intact Skin: Intact, no rashes or bruising Psych: Normal mood, normal affect  Data Reviewed:  Imaging: CTA 04/29/20 - No pulmonary emboli. Dependent bibasilar atelectasis. Diffuse emphysema with mosaic attenuation  PFT: None on file  Labs: CBC    Component Value Date/Time   WBC 10.0 07/14/2020 1114   WBC 5.4 05/01/2020 0347   RBC 4.64 07/14/2020 1114   HGB 14.5 07/14/2020 1114   HCT 43.6 07/14/2020 1114   PLT 194 07/14/2020 1114   MCV 94.0 07/14/2020 1114   MCH 31.3 07/14/2020 1114   MCHC 33.3 07/14/2020 1114   RDW 15.9 (H) 07/14/2020 1114   LYMPHSABS 3.4 07/14/2020 1114   MONOABS 0.9 07/14/2020 1114   EOSABS 0.1 07/14/2020 1114   BASOSABS 0.1 07/14/2020 1114   Imaging, labs and test noted above have been reviewed independently by  me.  Assessment & Plan:   Discussion: 70 year old female never smoker with breast cancer s/p left radiation in April 2022 x 1 month. Since then she has had shortness of breath and chest pressure/tightness. Hx of CAD s/p LAD stent (patent 04/2020). 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  Emphysema --START Advair HFA 115-21 mcg TWO puffs TWICE a day. This is your EVERYDAY inhaler --CONTINUE Albuterol AS NEEDED for shortness of breath or wheezing. This is your RESCUE inhaler --ARRANGE for pulmonary function tests   Health Maintenance Immunization History  Administered Date(s) Administered   Influenza, High Dose Seasonal PF 12/31/2016, 10/24/2017   Pneumococcal Conjugate-13 10/24/2017   Td 07/12/2005, 02/08/2019  Recommend to stay up to date vaccinations  CT Lung Screen - not indicated  Orders Placed This Encounter  Procedures   Pulmonary function test    Standing Status:   Future    Number of Occurrences:   1    Standing Expiration Date:   07/31/2021    Scheduling Instructions:     1 hour PFT    Order Specific Question:   Where should this test be performed?    Answer:   [facility]   Meds ordered this encounter  Medications   fluticasone-salmeterol (ADVAIR HFA) 115-21 MCG/ACT inhaler    Sig: Inhale 2 puffs into the lungs 2 (two) times daily.    Dispense:  1 each    Refill:  6     Return in about 3 months (around 10/31/2020).  I have spent a total time of 45-minutes on the day of the appointment reviewing prior documentation, coordinating care and discussing medical diagnosis and plan with the patient/family. Imaging, labs and tests included in this note have been reviewed and interpreted independently by me.  [place], MD [facility] 07/31/2020 1:42 PM  Office Number [phone]

## 2020-07-31 NOTE — Patient Instructions (Signed)
Emphysema --START Advair HFA 115-21 mcg TWO puffs TWICE a day. This is your EVERYDAY inhaler --CONTINUE Albuterol AS NEEDED for shortness of breath or wheezing. This is your RESCUE inhaler --ARRANGE for pulmonary function tests  Follow-up with me in 3 months with PFTs prior

## 2020-08-04 ENCOUNTER — Inpatient Hospital Stay (HOSPITAL_BASED_OUTPATIENT_CLINIC_OR_DEPARTMENT_OTHER): Payer: Medicare Other | Admitting: Adult Health

## 2020-08-04 ENCOUNTER — Inpatient Hospital Stay: Payer: Medicare Other

## 2020-08-04 ENCOUNTER — Encounter: Payer: Self-pay | Admitting: Adult Health

## 2020-08-04 ENCOUNTER — Ambulatory Visit (HOSPITAL_COMMUNITY)
Admission: RE | Admit: 2020-08-04 | Discharge: 2020-08-04 | Disposition: A | Discharge status: Home / Self Care | Payer: Medicare Other | Source: Ambulatory Visit | Attending: Adult Health | Admitting: Adult Health

## 2020-08-04 ENCOUNTER — Other Ambulatory Visit: Payer: Self-pay

## 2020-08-04 VITALS — BP 125/69 | HR 81 | Temp 97.8°F | Resp 18 | Ht 64.0 in | Wt 164.4 lb

## 2020-08-04 DIAGNOSIS — Z170 Estrogen receptor positive status [ER+]: Secondary | ICD-10-CM

## 2020-08-04 DIAGNOSIS — C50412 Malignant neoplasm of upper-outer quadrant of left female breast: Secondary | ICD-10-CM

## 2020-08-04 DIAGNOSIS — Z95828 Presence of other vascular implants and grafts: Secondary | ICD-10-CM

## 2020-08-04 DIAGNOSIS — Z5112 Encounter for antineoplastic immunotherapy: Secondary | ICD-10-CM | POA: Diagnosis not present

## 2020-08-04 LAB — CBC WITH DIFFERENTIAL (CANCER CENTER ONLY)
Abs Immature Granulocytes: 0.03 10*3/uL (ref 0.00–0.07)
Basophils Absolute: 0 10*3/uL (ref 0.0–0.1)
Basophils Relative: 1 %
Eosinophils Absolute: 0.1 10*3/uL (ref 0.0–0.5)
Eosinophils Relative: 1 %
HCT: 42.5 % (ref 36.0–46.0)
Hemoglobin: 14 g/dL (ref 12.0–15.0)
Immature Granulocytes: 0 %
Lymphocytes Relative: 44 %
Lymphs Abs: 3.6 10*3/uL (ref 0.7–4.0)
MCH: 31.2 pg (ref 26.0–34.0)
MCHC: 32.9 g/dL (ref 30.0–36.0)
MCV: 94.7 fL (ref 80.0–100.0)
Monocytes Absolute: 0.7 10*3/uL (ref 0.1–1.0)
Monocytes Relative: 9 %
Neutro Abs: 3.7 10*3/uL (ref 1.7–7.7)
Neutrophils Relative %: 45 %
Platelet Count: 167 10*3/uL (ref 150–400)
RBC: 4.49 MIL/uL (ref 3.87–5.11)
RDW: 15.6 % — ABNORMAL HIGH (ref 11.5–15.5)
WBC Count: 8.2 10*3/uL (ref 4.0–10.5)
nRBC: 0 % (ref 0.0–0.2)

## 2020-08-04 LAB — CMP (CANCER CENTER ONLY)
ALT: 33 U/L (ref 0–44)
AST: 33 U/L (ref 15–41)
Albumin: 4.2 g/dL (ref 3.5–5.0)
Alkaline Phosphatase: 59 U/L (ref 38–126)
Anion gap: 13 (ref 5–15)
BUN: 12 mg/dL (ref 8–23)
CO2: 27 mmol/L (ref 22–32)
Calcium: 10.3 mg/dL (ref 8.9–10.3)
Chloride: 100 mmol/L (ref 98–111)
Creatinine: 0.52 mg/dL (ref 0.44–1.00)
GFR, Estimated: >60 mL/min (ref >60)
Glucose, Bld: 240 mg/dL — ABNORMAL HIGH (ref 70–99)
Potassium: 3.7 mmol/L (ref 3.5–5.1)
Sodium: 140 mmol/L (ref 135–145)
Total Bilirubin: 0.6 mg/dL (ref 0.3–1.2)
Total Protein: 7.2 g/dL (ref 6.5–8.1)

## 2020-08-04 MED ORDER — LORAZEPAM 2 MG/ML IJ SOLN
INTRAMUSCULAR | Status: AC
  Filled 2020-08-04: qty 1

## 2020-08-04 MED ORDER — ACETAMINOPHEN 325 MG PO TABS
650.0000 mg | ORAL_TABLET | Freq: Once | ORAL | Status: AC
  Administered 2020-08-04: 650 mg via ORAL

## 2020-08-04 MED ORDER — SODIUM CHLORIDE 0.9% FLUSH
10.0000 mL | INTRAVENOUS | Status: DC | PRN
  Filled 2020-08-04: qty 10

## 2020-08-04 MED ORDER — HEPARIN SOD (PORK) LOCK FLUSH 100 UNIT/ML IV SOLN
500.0000 [IU] | Freq: Once | INTRAVENOUS | Status: DC | PRN
  Filled 2020-08-04: qty 5

## 2020-08-04 MED ORDER — SODIUM CHLORIDE 0.9 % IV SOLN
Freq: Once | INTRAVENOUS | Status: AC
  Filled 2020-08-04: qty 250

## 2020-08-04 MED ORDER — LORAZEPAM 2 MG/ML IJ SOLN
0.5000 mg | Freq: Once | INTRAMUSCULAR | Status: AC
  Administered 2020-08-04: 0.5 mg via INTRAVENOUS

## 2020-08-04 MED ORDER — DIPHENHYDRAMINE HCL 25 MG PO CAPS
ORAL_CAPSULE | ORAL | Status: AC
  Filled 2020-08-04: qty 1

## 2020-08-04 MED ORDER — SODIUM CHLORIDE 0.9% FLUSH
10.0000 mL | Freq: Once | INTRAVENOUS | Status: AC
  Administered 2020-08-04: 10 mL
  Filled 2020-08-04: qty 10

## 2020-08-04 MED ORDER — DIPHENHYDRAMINE HCL 25 MG PO CAPS
25.0000 mg | ORAL_CAPSULE | Freq: Once | ORAL | Status: AC
  Administered 2020-08-04: 25 mg via ORAL

## 2020-08-04 MED ORDER — SODIUM CHLORIDE 0.9 % IV SOLN
3.6000 mg/kg | Freq: Once | INTRAVENOUS | Status: AC
  Administered 2020-08-04: 280 mg via INTRAVENOUS
  Filled 2020-08-04: qty 14

## 2020-08-04 MED ORDER — ACETAMINOPHEN 325 MG PO TABS
ORAL_TABLET | ORAL | Status: AC
  Filled 2020-08-04: qty 2

## 2020-08-04 NOTE — Patient Instructions (Signed)
[facility]   °Discharge Instructions: °Thank you for choosing [facility] to provide your oncology and hematology care.  ° °If you have a lab appointment with the Cancer Center, please go directly to the Cancer Center and check in at the registration area. °  °Wear comfortable clothing and clothing appropriate for easy access to any Portacath or PICC line.  ° °We strive to give you quality time with your provider. You may need to reschedule your appointment if you arrive late (15 or more minutes).  Arriving late affects you and other patients whose appointments are after yours.  Also, if you miss three or more appointments without notifying the office, you may be dismissed from the clinic at the provider’s discretion.    °  °For prescription refill requests, have your pharmacy contact our office and allow 72 hours for refills to be completed.   ° °Today you received the following chemotherapy and/or immunotherapy agents: ado-trastuzumab emtansine    °  °To help prevent nausea and vomiting after your treatment, we encourage you to take your nausea medication as directed. ° °BELOW ARE SYMPTOMS THAT SHOULD BE REPORTED IMMEDIATELY: °*FEVER GREATER THAN 100.4 F (38 °C) OR HIGHER °*CHILLS OR SWEATING °*NAUSEA AND VOMITING THAT IS NOT CONTROLLED WITH YOUR NAUSEA MEDICATION °*UNUSUAL SHORTNESS OF BREATH °*UNUSUAL BRUISING OR BLEEDING °*URINARY PROBLEMS (pain or burning when urinating, or frequent urination) °*BOWEL PROBLEMS (unusual diarrhea, constipation, pain near the anus) °TENDERNESS IN MOUTH AND THROAT WITH OR WITHOUT PRESENCE OF ULCERS (sore throat, sores in mouth, or a toothache) °UNUSUAL RASH, SWELLING OR PAIN  °UNUSUAL VAGINAL DISCHARGE OR ITCHING  ° °Items with * indicate a potential emergency and should be followed up as soon as possible or go to the Emergency Department if any problems should occur. ° °Please show the CHEMOTHERAPY ALERT CARD or IMMUNOTHERAPY ALERT  CARD at check-in to the Emergency Department and triage nurse. ° °Should you have questions after your visit or need to cancel or reschedule your appointment, please contact [facility]  Dept: [phone]  and follow the prompts.  Office hours are 8:00 a.m. to 4:30 p.m. Monday - Friday. Please note that voicemails left after 4:00 p.m. may not be returned until the following business day.  We are closed weekends and major holidays. You have access to a nurse at all times for urgent questions. Please call the main number to the clinic Dept: [phone] and follow the prompts. ° ° °For any non-urgent questions, you may also contact your provider using MyChart. We now offer e-Visits for anyone 18 and older to request care online for non-urgent symptoms. For details visit [email or web address]. °  °Also download the MyChart app! Go to the app store, search "MyChart", open the app, select [facility], and log in with your MyChart username and password. ° °Due to Covid, a mask is required upon entering the hospital/clinic. If you do not have a mask, one will be given to you upon arrival. For doctor visits, patients may have 1 support person aged 18 or older with them. For treatment visits, patients cannot have anyone with them due to current Covid guidelines and our immunocompromised population.  ° °

## 2020-08-05 ENCOUNTER — Other Ambulatory Visit: Payer: Self-pay | Admitting: Cardiovascular Disease

## 2020-08-10 ENCOUNTER — Ambulatory Visit (INDEPENDENT_AMBULATORY_CARE_PROVIDER_SITE_OTHER): Payer: Medicare Other | Admitting: Pulmonary Disease

## 2020-08-10 ENCOUNTER — Other Ambulatory Visit: Payer: Self-pay

## 2020-08-10 DIAGNOSIS — J432 Centrilobular emphysema: Secondary | ICD-10-CM | POA: Diagnosis not present

## 2020-08-10 DIAGNOSIS — R0602 Shortness of breath: Secondary | ICD-10-CM

## 2020-08-10 LAB — PULMONARY FUNCTION TEST
DL/VA % pred: 131 %
DL/VA: 5.44 ml/min/mmHg/L
DLCO cor % pred: 115 %
DLCO cor: 22.47 ml/min/mmHg
DLCO unc % pred: 117 %
DLCO unc: 22.87 ml/min/mmHg
FEF 25-75 Post: 3.36 L/sec
FEF 25-75 Pre: 2.64 L/sec
FEF2575-%Change-Post: 27 %
FEF2575-%Pred-Post: 176 %
FEF2575-%Pred-Pre: 139 %
FEV1-%Change-Post: 2 %
FEV1-%Pred-Post: 105 %
FEV1-%Pred-Pre: 102 %
FEV1-Post: 2.39 L
FEV1-Pre: 2.33 L
FEV1FVC-%Change-Post: 4 %
FEV1FVC-%Pred-Pre: 115 %
FEV6-%Change-Post: -2 %
FEV6-%Pred-Post: 91 %
FEV6-%Pred-Pre: 92 %
FEV6-Post: 2.61 L
FEV6-Pre: 2.66 L
FEV6FVC-%Pred-Post: 104 %
FEV6FVC-%Pred-Pre: 104 %
FVC-%Change-Post: -2 %
FVC-%Pred-Post: 87 %
FVC-%Pred-Pre: 88 %
FVC-Post: 2.61 L
FVC-Pre: 2.66 L
Post FEV1/FVC ratio: 92 %
Post FEV6/FVC ratio: 100 %
Pre FEV1/FVC ratio: 87 %
Pre FEV6/FVC Ratio: 100 %
RV % pred: 85 %
RV: 1.87 L
TLC % pred: 88 %
TLC: 4.45 L

## 2020-08-10 NOTE — Progress Notes (Signed)
Full PFT performed today. °

## 2020-08-10 NOTE — Patient Instructions (Signed)
Full PFT performed today. °

## 2020-08-14 ENCOUNTER — Ambulatory Visit (HOSPITAL_COMMUNITY)
Admission: RE | Admit: 2020-08-14 | Discharge: 2020-08-14 | Disposition: A | Discharge status: Home / Self Care | Payer: Medicare Other | Source: Ambulatory Visit | Attending: Adult Health | Admitting: Adult Health

## 2020-08-14 ENCOUNTER — Other Ambulatory Visit: Payer: Self-pay

## 2020-08-14 DIAGNOSIS — C50412 Malignant neoplasm of upper-outer quadrant of left female breast: Secondary | ICD-10-CM | POA: Insufficient documentation

## 2020-08-14 DIAGNOSIS — E119 Type 2 diabetes mellitus without complications: Secondary | ICD-10-CM | POA: Insufficient documentation

## 2020-08-14 DIAGNOSIS — E785 Hyperlipidemia, unspecified: Secondary | ICD-10-CM | POA: Diagnosis not present

## 2020-08-14 DIAGNOSIS — I10 Essential (primary) hypertension: Secondary | ICD-10-CM | POA: Diagnosis not present

## 2020-08-14 DIAGNOSIS — Z170 Estrogen receptor positive status [ER+]: Secondary | ICD-10-CM | POA: Insufficient documentation

## 2020-08-14 DIAGNOSIS — I351 Nonrheumatic aortic (valve) insufficiency: Secondary | ICD-10-CM | POA: Insufficient documentation

## 2020-08-14 DIAGNOSIS — E079 Disorder of thyroid, unspecified: Secondary | ICD-10-CM | POA: Insufficient documentation

## 2020-08-14 DIAGNOSIS — Z0189 Encounter for other specified special examinations: Secondary | ICD-10-CM

## 2020-08-14 DIAGNOSIS — Z01818 Encounter for other preprocedural examination: Secondary | ICD-10-CM | POA: Diagnosis present

## 2020-08-14 LAB — ECHOCARDIOGRAM COMPLETE
Area-P 1/2: 3.94 cm2
P 1/2 time: 434 msec
S' Lateral: 2.3 cm

## 2020-08-14 NOTE — Progress Notes (Signed)
  2D Echocardiogram has been performed.  Darlina Sicilian M 08/14/2020, 10:44 AM

## 2020-08-17 ENCOUNTER — Telehealth: Payer: Self-pay | Admitting: Pulmonary Disease

## 2020-08-17 NOTE — Telephone Encounter (Signed)
Call returned to patient, confirmed DOB. Made aware JE returns on the 11th. Patient is okay waiting until then. She states she does not feel like the inhaler made a significant difference. She is using Advair twice daily. She is not having to use the albuterol. She reports loosing her voice and this is getting worse. She is wanting recommendations for the hoarseness.   RB please advise. Thanks.   Will also route to JE however she does not return until 8/11

## 2020-08-17 NOTE — Telephone Encounter (Signed)
ATC patient to let her know of PFT results from Dr. Lamonte Sakai, per DPR left detailed message with results and advised her to call with any questions or concerns. Nothing further needed at this time.

## 2020-08-17 NOTE — Telephone Encounter (Signed)
Please let the patient know that I reviewed her pulmonary function testing.  They look normal.  She can discuss further with Dr. Loanne Drilling when they meet in the office.

## 2020-08-24 ENCOUNTER — Telehealth: Payer: Self-pay | Admitting: Pulmonary Disease

## 2020-08-24 ENCOUNTER — Telehealth (INDEPENDENT_AMBULATORY_CARE_PROVIDER_SITE_OTHER): Payer: Medicare Other | Admitting: Pulmonary Disease

## 2020-08-24 DIAGNOSIS — J432 Centrilobular emphysema: Secondary | ICD-10-CM

## 2020-08-24 NOTE — Telephone Encounter (Signed)
Called patient regarding PFT results. Normal PFTs. She is no longer taking the Advair and reports feeling better. Did have some hoarseness of with inhaler.  Assessment Emphysema - Normal PFTs --CONTINUE Advair PRN for shortness of breath or wheezing.

## 2020-08-25 ENCOUNTER — Inpatient Hospital Stay: Payer: Medicare Other | Attending: Oncology

## 2020-08-25 ENCOUNTER — Inpatient Hospital Stay: Payer: Medicare Other

## 2020-08-25 ENCOUNTER — Other Ambulatory Visit: Payer: Self-pay

## 2020-08-25 ENCOUNTER — Encounter: Payer: Self-pay | Admitting: General Practice

## 2020-08-25 VITALS — BP 135/79 | HR 69 | Temp 97.6°F | Resp 18 | Ht 64.0 in | Wt 160.5 lb

## 2020-08-25 DIAGNOSIS — Z833 Family history of diabetes mellitus: Secondary | ICD-10-CM | POA: Diagnosis not present

## 2020-08-25 DIAGNOSIS — R0602 Shortness of breath: Secondary | ICD-10-CM | POA: Diagnosis not present

## 2020-08-25 DIAGNOSIS — C50412 Malignant neoplasm of upper-outer quadrant of left female breast: Secondary | ICD-10-CM

## 2020-08-25 DIAGNOSIS — Z5112 Encounter for antineoplastic immunotherapy: Secondary | ICD-10-CM | POA: Diagnosis not present

## 2020-08-25 DIAGNOSIS — Z801 Family history of malignant neoplasm of trachea, bronchus and lung: Secondary | ICD-10-CM | POA: Diagnosis not present

## 2020-08-25 DIAGNOSIS — Z170 Estrogen receptor positive status [ER+]: Secondary | ICD-10-CM | POA: Diagnosis not present

## 2020-08-25 DIAGNOSIS — Z818 Family history of other mental and behavioral disorders: Secondary | ICD-10-CM | POA: Diagnosis not present

## 2020-08-25 DIAGNOSIS — Z803 Family history of malignant neoplasm of breast: Secondary | ICD-10-CM | POA: Diagnosis not present

## 2020-08-25 DIAGNOSIS — Z8249 Family history of ischemic heart disease and other diseases of the circulatory system: Secondary | ICD-10-CM | POA: Insufficient documentation

## 2020-08-25 DIAGNOSIS — Z79899 Other long term (current) drug therapy: Secondary | ICD-10-CM | POA: Insufficient documentation

## 2020-08-25 DIAGNOSIS — R5383 Other fatigue: Secondary | ICD-10-CM | POA: Insufficient documentation

## 2020-08-25 DIAGNOSIS — Z95828 Presence of other vascular implants and grafts: Secondary | ICD-10-CM

## 2020-08-25 LAB — CMP (CANCER CENTER ONLY)
ALT: 41 U/L (ref 0–44)
AST: 45 U/L — ABNORMAL HIGH (ref 15–41)
Albumin: 3.8 g/dL (ref 3.5–5.0)
Alkaline Phosphatase: 79 U/L (ref 38–126)
Anion gap: 13 (ref 5–15)
BUN: 13 mg/dL (ref 8–23)
CO2: 23 mmol/L (ref 22–32)
Calcium: 10.4 mg/dL — ABNORMAL HIGH (ref 8.9–10.3)
Chloride: 102 mmol/L (ref 98–111)
Creatinine: 0.82 mg/dL (ref 0.44–1.00)
GFR, Estimated: >60 mL/min (ref >60)
Glucose, Bld: 300 mg/dL — ABNORMAL HIGH (ref 70–99)
Potassium: 4 mmol/L (ref 3.5–5.1)
Sodium: 138 mmol/L (ref 135–145)
Total Bilirubin: 0.5 mg/dL (ref 0.3–1.2)
Total Protein: 7.6 g/dL (ref 6.5–8.1)

## 2020-08-25 LAB — CBC WITH DIFFERENTIAL (CANCER CENTER ONLY)
Abs Immature Granulocytes: 0.02 10*3/uL (ref 0.00–0.07)
Basophils Absolute: 0.1 10*3/uL (ref 0.0–0.1)
Basophils Relative: 1 %
Eosinophils Absolute: 0.1 10*3/uL (ref 0.0–0.5)
Eosinophils Relative: 1 %
HCT: 43.8 % (ref 36.0–46.0)
Hemoglobin: 14.8 g/dL (ref 12.0–15.0)
Immature Granulocytes: 0 %
Lymphocytes Relative: 39 %
Lymphs Abs: 3.7 10*3/uL (ref 0.7–4.0)
MCH: 31.4 pg (ref 26.0–34.0)
MCHC: 33.8 g/dL (ref 30.0–36.0)
MCV: 93 fL (ref 80.0–100.0)
Monocytes Absolute: 0.7 10*3/uL (ref 0.1–1.0)
Monocytes Relative: 7 %
Neutro Abs: 4.9 10*3/uL (ref 1.7–7.7)
Neutrophils Relative %: 52 %
Platelet Count: 169 10*3/uL (ref 150–400)
RBC: 4.71 MIL/uL (ref 3.87–5.11)
RDW: 15.2 % (ref 11.5–15.5)
WBC Count: 9.4 10*3/uL (ref 4.0–10.5)
nRBC: 0 % (ref 0.0–0.2)

## 2020-08-25 MED ORDER — HEPARIN SOD (PORK) LOCK FLUSH 100 UNIT/ML IV SOLN
500.0000 [IU] | Freq: Once | INTRAVENOUS | Status: AC | PRN
  Administered 2020-08-25: 500 [IU]
  Filled 2020-08-25: qty 5

## 2020-08-25 MED ORDER — ACETAMINOPHEN 325 MG PO TABS
650.0000 mg | ORAL_TABLET | Freq: Once | ORAL | Status: AC
  Administered 2020-08-25: 650 mg via ORAL
  Filled 2020-08-25: qty 2

## 2020-08-25 MED ORDER — SODIUM CHLORIDE 0.9 % IV SOLN
3.6000 mg/kg | Freq: Once | INTRAVENOUS | Status: AC
  Administered 2020-08-25: 280 mg via INTRAVENOUS
  Filled 2020-08-25: qty 14

## 2020-08-25 MED ORDER — DIPHENHYDRAMINE HCL 25 MG PO CAPS
25.0000 mg | ORAL_CAPSULE | Freq: Once | ORAL | Status: AC
  Administered 2020-08-25: 25 mg via ORAL
  Filled 2020-08-25: qty 1

## 2020-08-25 MED ORDER — SODIUM CHLORIDE 0.9% FLUSH
10.0000 mL | INTRAVENOUS | Status: DC | PRN
  Administered 2020-08-25: 10 mL
  Filled 2020-08-25: qty 10

## 2020-08-25 MED ORDER — SODIUM CHLORIDE 0.9 % IV SOLN
Freq: Once | INTRAVENOUS | Status: AC
  Filled 2020-08-25: qty 250

## 2020-08-25 MED ORDER — SODIUM CHLORIDE 0.9% FLUSH
10.0000 mL | Freq: Once | INTRAVENOUS | Status: AC
  Administered 2020-08-25: 10 mL
  Filled 2020-08-25: qty 10

## 2020-08-25 MED ORDER — LORAZEPAM 2 MG/ML IJ SOLN
0.5000 mg | Freq: Once | INTRAMUSCULAR | Status: AC
  Administered 2020-08-25: 0.5 mg via INTRAVENOUS
  Filled 2020-08-25: qty 1

## 2020-08-25 NOTE — Patient Instructions (Signed)
[facility]   °Discharge Instructions: °Thank you for choosing [facility] to provide your oncology and hematology care.  ° °If you have a lab appointment with the Cancer Center, please go directly to the Cancer Center and check in at the registration area. °  °Wear comfortable clothing and clothing appropriate for easy access to any Portacath or PICC line.  ° °We strive to give you quality time with your provider. You may need to reschedule your appointment if you arrive late (15 or more minutes).  Arriving late affects you and other patients whose appointments are after yours.  Also, if you miss three or more appointments without notifying the office, you may be dismissed from the clinic at the provider’s discretion.    °  °For prescription refill requests, have your pharmacy contact our office and allow 72 hours for refills to be completed.   ° °Today you received the following chemotherapy and/or immunotherapy agents: ado-trastuzumab emtansine    °  °To help prevent nausea and vomiting after your treatment, we encourage you to take your nausea medication as directed. ° °BELOW ARE SYMPTOMS THAT SHOULD BE REPORTED IMMEDIATELY: °*FEVER GREATER THAN 100.4 F (38 °C) OR HIGHER °*CHILLS OR SWEATING °*NAUSEA AND VOMITING THAT IS NOT CONTROLLED WITH YOUR NAUSEA MEDICATION °*UNUSUAL SHORTNESS OF BREATH °*UNUSUAL BRUISING OR BLEEDING °*URINARY PROBLEMS (pain or burning when urinating, or frequent urination) °*BOWEL PROBLEMS (unusual diarrhea, constipation, pain near the anus) °TENDERNESS IN MOUTH AND THROAT WITH OR WITHOUT PRESENCE OF ULCERS (sore throat, sores in mouth, or a toothache) °UNUSUAL RASH, SWELLING OR PAIN  °UNUSUAL VAGINAL DISCHARGE OR ITCHING  ° °Items with * indicate a potential emergency and should be followed up as soon as possible or go to the Emergency Department if any problems should occur. ° °Please show the CHEMOTHERAPY ALERT CARD or IMMUNOTHERAPY ALERT  CARD at check-in to the Emergency Department and triage nurse. ° °Should you have questions after your visit or need to cancel or reschedule your appointment, please contact [facility]  Dept: [phone]  and follow the prompts.  Office hours are 8:00 a.m. to 4:30 p.m. Monday - Friday. Please note that voicemails left after 4:00 p.m. may not be returned until the following business day.  We are closed weekends and major holidays. You have access to a nurse at all times for urgent questions. Please call the main number to the clinic Dept: [phone] and follow the prompts. ° ° °For any non-urgent questions, you may also contact your provider using MyChart. We now offer e-Visits for anyone 18 and older to request care online for non-urgent symptoms. For details visit [email or web address]. °  °Also download the MyChart app! Go to the app store, search "MyChart", open the app, select [facility], and log in with your MyChart username and password. ° °Due to Covid, a mask is required upon entering the hospital/clinic. If you do not have a mask, one will be given to you upon arrival. For doctor visits, patients may have 1 support person aged 18 or older with them. For treatment visits, patients cannot have anyone with them due to current Covid guidelines and our immunocompromised population.  ° °

## 2020-08-25 NOTE — Progress Notes (Signed)
[facility] CSW Progress Notes  Call from infusion, patient requesting to see social worker, primary assigned CSW not available.  CSW met w patient in infusion.  Patient is depressed - multiple stressors including cancer recurrance and financial stress.  Rent is being raised to level that her fixed income cannot cover.  She has been unable to find another place that is affordable despite several attempts.  She is concerned her car will be repossessed soon as she is unable to make her car payments.  Is behind in multiple bills. Previously, she was supplementing her Social Security retirement w part time work in [employer] - since radiation, she has been unable to continue this.    Provided supportive listening and empathy.  Asked CSW Floydene Flock to connect w patient upon her return next week.  Provided patient with list of elderly/disabled subsidized housing in the area, contact card for [employer] of [place] and applications for breast cancer financial assistance agencies ([facility] in [place]).  CSW Zavala asked to help her file these if desired.  Edwyna Shell, LCSW Clinical Social Worker Phone:  [phone]

## 2020-08-28 ENCOUNTER — Telehealth: Payer: Self-pay | Admitting: Licensed Clinical Social Worker

## 2020-08-28 NOTE — Telephone Encounter (Signed)
[facility] Clinical Social Work  CSW attempted to contact patient to follow-up on resources and needs discussed with CSW Rea on 08/25/2020. No answer. Left VM with direct contact information.   Christeen Douglas, LCSW

## 2020-09-07 ENCOUNTER — Telehealth: Payer: Self-pay | Admitting: Licensed Clinical Social Worker

## 2020-09-07 NOTE — Telephone Encounter (Signed)
[facility] Clinical Social Work  CSW received voicemail from patient from last night (8/24). CSW attempted to return call. No answer. Left VM with direct contact information.   Christeen Douglas, LCSW

## 2020-09-11 NOTE — Telephone Encounter (Signed)
CSW received return VM from patient asking to meet to turn in paperwork (applications for assistance). CSW attempted to call pt back, no answer, left VM. Will plan to see pt in person at appt on Friday.   Christeen Douglas, LCSW

## 2020-09-15 ENCOUNTER — Inpatient Hospital Stay: Payer: Medicare Other | Admitting: Licensed Clinical Social Worker

## 2020-09-15 ENCOUNTER — Inpatient Hospital Stay (HOSPITAL_BASED_OUTPATIENT_CLINIC_OR_DEPARTMENT_OTHER): Payer: Medicare Other | Admitting: Adult Health

## 2020-09-15 ENCOUNTER — Encounter: Payer: Self-pay | Admitting: *Deleted

## 2020-09-15 ENCOUNTER — Inpatient Hospital Stay: Payer: Medicare Other

## 2020-09-15 ENCOUNTER — Inpatient Hospital Stay: Payer: Medicare Other | Attending: Oncology

## 2020-09-15 ENCOUNTER — Encounter: Payer: Self-pay | Admitting: Adult Health

## 2020-09-15 ENCOUNTER — Other Ambulatory Visit: Payer: Self-pay

## 2020-09-15 VITALS — BP 139/72 | HR 86 | Temp 97.5°F | Resp 18 | Ht 64.0 in | Wt 163.3 lb

## 2020-09-15 DIAGNOSIS — Z833 Family history of diabetes mellitus: Secondary | ICD-10-CM | POA: Diagnosis not present

## 2020-09-15 DIAGNOSIS — C50412 Malignant neoplasm of upper-outer quadrant of left female breast: Secondary | ICD-10-CM

## 2020-09-15 DIAGNOSIS — Z79899 Other long term (current) drug therapy: Secondary | ICD-10-CM | POA: Insufficient documentation

## 2020-09-15 DIAGNOSIS — E785 Hyperlipidemia, unspecified: Secondary | ICD-10-CM | POA: Insufficient documentation

## 2020-09-15 DIAGNOSIS — Z170 Estrogen receptor positive status [ER+]: Secondary | ICD-10-CM

## 2020-09-15 DIAGNOSIS — I2510 Atherosclerotic heart disease of native coronary artery without angina pectoris: Secondary | ICD-10-CM | POA: Insufficient documentation

## 2020-09-15 DIAGNOSIS — Z923 Personal history of irradiation: Secondary | ICD-10-CM | POA: Diagnosis not present

## 2020-09-15 DIAGNOSIS — Z818 Family history of other mental and behavioral disorders: Secondary | ICD-10-CM | POA: Insufficient documentation

## 2020-09-15 DIAGNOSIS — Z5112 Encounter for antineoplastic immunotherapy: Secondary | ICD-10-CM | POA: Diagnosis present

## 2020-09-15 DIAGNOSIS — R5383 Other fatigue: Secondary | ICD-10-CM | POA: Insufficient documentation

## 2020-09-15 DIAGNOSIS — E119 Type 2 diabetes mellitus without complications: Secondary | ICD-10-CM | POA: Insufficient documentation

## 2020-09-15 DIAGNOSIS — F32A Depression, unspecified: Secondary | ICD-10-CM | POA: Insufficient documentation

## 2020-09-15 DIAGNOSIS — Z803 Family history of malignant neoplasm of breast: Secondary | ICD-10-CM | POA: Insufficient documentation

## 2020-09-15 DIAGNOSIS — Z8249 Family history of ischemic heart disease and other diseases of the circulatory system: Secondary | ICD-10-CM | POA: Insufficient documentation

## 2020-09-15 DIAGNOSIS — R0602 Shortness of breath: Secondary | ICD-10-CM | POA: Insufficient documentation

## 2020-09-15 DIAGNOSIS — Z808 Family history of malignant neoplasm of other organs or systems: Secondary | ICD-10-CM | POA: Insufficient documentation

## 2020-09-15 DIAGNOSIS — Z90722 Acquired absence of ovaries, bilateral: Secondary | ICD-10-CM | POA: Diagnosis not present

## 2020-09-15 DIAGNOSIS — Z79811 Long term (current) use of aromatase inhibitors: Secondary | ICD-10-CM | POA: Diagnosis not present

## 2020-09-15 DIAGNOSIS — Z95828 Presence of other vascular implants and grafts: Secondary | ICD-10-CM

## 2020-09-15 LAB — CBC WITH DIFFERENTIAL (CANCER CENTER ONLY)
Abs Immature Granulocytes: 0.02 10*3/uL (ref 0.00–0.07)
Basophils Absolute: 0 10*3/uL (ref 0.0–0.1)
Basophils Relative: 1 %
Eosinophils Absolute: 0.1 10*3/uL (ref 0.0–0.5)
Eosinophils Relative: 1 %
HCT: 42 % (ref 36.0–46.0)
Hemoglobin: 13.8 g/dL (ref 12.0–15.0)
Immature Granulocytes: 0 %
Lymphocytes Relative: 38 %
Lymphs Abs: 2.9 10*3/uL (ref 0.7–4.0)
MCH: 31.3 pg (ref 26.0–34.0)
MCHC: 32.9 g/dL (ref 30.0–36.0)
MCV: 95.2 fL (ref 80.0–100.0)
Monocytes Absolute: 0.7 10*3/uL (ref 0.1–1.0)
Monocytes Relative: 9 %
Neutro Abs: 3.9 10*3/uL (ref 1.7–7.7)
Neutrophils Relative %: 51 %
Platelet Count: 149 10*3/uL — ABNORMAL LOW (ref 150–400)
RBC: 4.41 MIL/uL (ref 3.87–5.11)
RDW: 15.5 % (ref 11.5–15.5)
WBC Count: 7.6 10*3/uL (ref 4.0–10.5)
nRBC: 0 % (ref 0.0–0.2)

## 2020-09-15 LAB — CMP (CANCER CENTER ONLY)
ALT: 35 U/L (ref 0–44)
AST: 36 U/L (ref 15–41)
Albumin: 3.8 g/dL (ref 3.5–5.0)
Alkaline Phosphatase: 78 U/L (ref 38–126)
Anion gap: 14 (ref 5–15)
BUN: 11 mg/dL (ref 8–23)
CO2: 23 mmol/L (ref 22–32)
Calcium: 10.4 mg/dL — ABNORMAL HIGH (ref 8.9–10.3)
Chloride: 102 mmol/L (ref 98–111)
Creatinine: 0.77 mg/dL (ref 0.44–1.00)
GFR, Estimated: >60 mL/min (ref >60)
Glucose, Bld: 284 mg/dL — ABNORMAL HIGH (ref 70–99)
Potassium: 4.3 mmol/L (ref 3.5–5.1)
Sodium: 139 mmol/L (ref 135–145)
Total Bilirubin: 0.4 mg/dL (ref 0.3–1.2)
Total Protein: 7.3 g/dL (ref 6.5–8.1)

## 2020-09-15 MED ORDER — ACETAMINOPHEN 325 MG PO TABS
650.0000 mg | ORAL_TABLET | Freq: Once | ORAL | Status: AC
  Administered 2020-09-15: 650 mg via ORAL
  Filled 2020-09-15: qty 2

## 2020-09-15 MED ORDER — SODIUM CHLORIDE 0.9% FLUSH: 10.0000 mL | Freq: Once | INTRAVENOUS | Status: DC

## 2020-09-15 MED ORDER — HEPARIN SOD (PORK) LOCK FLUSH 100 UNIT/ML IV SOLN
500.0000 [IU] | Freq: Once | INTRAVENOUS | Status: AC | PRN
  Administered 2020-09-15: 500 [IU]

## 2020-09-15 MED ORDER — SODIUM CHLORIDE 0.9 % IV SOLN
3.6000 mg/kg | Freq: Once | INTRAVENOUS | Status: AC
  Administered 2020-09-15: 280 mg via INTRAVENOUS
  Filled 2020-09-15: qty 14

## 2020-09-15 MED ORDER — SODIUM CHLORIDE 0.9 % IV SOLN: Freq: Once | INTRAVENOUS | Status: AC

## 2020-09-15 MED ORDER — DIPHENHYDRAMINE HCL 25 MG PO CAPS
25.0000 mg | ORAL_CAPSULE | Freq: Once | ORAL | Status: AC
  Administered 2020-09-15: 25 mg via ORAL
  Filled 2020-09-15: qty 1

## 2020-09-15 MED ORDER — LORAZEPAM 2 MG/ML IJ SOLN
0.5000 mg | Freq: Once | INTRAMUSCULAR | Status: AC
  Administered 2020-09-15: 0.5 mg via INTRAVENOUS
  Filled 2020-09-15: qty 1

## 2020-09-15 MED ORDER — SODIUM CHLORIDE 0.9% FLUSH
10.0000 mL | INTRAVENOUS | Status: DC | PRN
  Administered 2020-09-15: 10 mL

## 2020-09-15 NOTE — Progress Notes (Signed)
[place]  [phone] [phone]     ID: Zamara Cozad DOB: 1950/05/25  MR#: [id number]  RWE#:[id number]  Patient Care Team: Kathyrn Lass, MD as PCP - General (Family Medicine) Nahser, [name], MD as PCP - Cardiology (Cardiology) Mauro Kaufmann, RN as Oncology Nurse Navigator Rockwell Germany, RN as Oncology Nurse Navigator Rolm Bookbinder, MD as Consulting Physician (General Surgery) Magrinat, Virgie Dad, MD as Consulting Physician (Oncology) Eppie Gibson, MD as Attending Physician (Radiation Oncology) [name], MD as Consulting Physician (Rheumatology) [name], MD as Consulting Physician (Orthopedic Surgery) Janyth Pupa, DO as Consulting Physician (Obstetrics and Gynecology) Larey Dresser, MD as Consulting Physician (Cardiology) Madelin Rear, MD as Consulting Physician (Endocrinology) Magrinat, Virgie Dad, MD as Consulting Physician (Oncology) Maryanna Shape, NP as Nurse Practitioner (Oncology) Scot Dock, NP OTHER MD:  CHIEF COMPLAINT: Triple positive invasive breast cancer  CURRENT TREATMENT: T-DM1   INTERVAL HISTORY: Priscella returns today for follow up and treatment of her triple positive invasive breast cancer.    She is receiving TDM-1 every 3 weeks and tolerates it well.  Donelda has previously been having issues with shortness of breath.  She underwent a full pulmonology work up and has been prescribed inhalers.  Evetta notes the shortness of breath is improving, and seems to be more related to the hot weather outside.  She underwent xray 3 weeks ago which showed some radiation changes.    Her most recent echocardiogram was completed on 08/14/2020 and showed an EF of 65-70%.    Lena is tearful today due to her financial stressors.  Her rent has increased, and she is very concerned she may lose her house.  She does not have enough money to pay her bills.  She has been meeting with our social work  team who has paperwork for her to complete to give her some assistance.    REVIEW OF SYSTEMS: Review of Systems  Constitutional:  Positive for fatigue. Negative for appetite change, chills, fever and unexpected weight change.  HENT:   Negative for hearing loss, lump/mass and trouble swallowing.   Eyes:  Negative for eye problems and icterus.  Respiratory:  Positive for shortness of breath (improved today). Negative for chest tightness and cough.   Cardiovascular:  Negative for chest pain, leg swelling and palpitations.  Gastrointestinal:  Negative for abdominal distention, abdominal pain, constipation, diarrhea, nausea and vomiting.  Endocrine: Negative for hot flashes.  Genitourinary:  Negative for difficulty urinating.   Musculoskeletal:  Negative for arthralgias.  Skin:  Negative for itching and rash.  Neurological:  Negative for dizziness, extremity weakness, headaches and numbness.  Hematological:  Negative for adenopathy. Does not bruise/bleed easily.  Psychiatric/Behavioral:  Positive for depression. Negative for suicidal ideas. The patient is not nervous/anxious.       COVID 19 VACCINATION STATUS: Refuses vaccination   HISTORY OF CURRENT ILLNESS: From the original intake note:  Mirren Gest had routine screening mammography on 08/03/2019 showing a possible abnormality in the left breast. She underwent left diagnostic mammography with tomography and left breast ultrasonography at [facility] on 08/17/2019 showing: breast density category B; 1.6 cm upper-outer left breast mass; no significant left axillary abnormalities.  Accordingly on 08/30/2019 she proceeded to biopsy of the left breast area in question. The pathology from this procedure ([id number]) showed: invasive mammary carcinoma, e-cadherin positive, grade 2. Prognostic indicators significant for: estrogen receptor, 100% positive and progesterone receptor, 40% positive, both with strong staining intensity. Proliferation marker  Ki67  at 20%. HER2 equivocal by immunohistochemistry (2+), but negative by fluorescent in situ hybridization with a signals ratio   and number per cell  .  Of note, she also has a history of FIGO grade 3, stage IA endometrial carcinoma, diagnosed in 2014 in [place]. She underwent robotic assisted total hysterectomy with BSO and pelvic and perirectal aortic lymph node dissection as well as omental biopsy.  Tumor was found only in the endometrium arising in a polyp measuring 1.7 cm.  There was no myometrial invasion no lymphovascular space invasion, and no lower uterine segment involvement.  The cervix and adnexa, lymph nodes and omentum were all not involved.  She then received vaginal brachytherapy, no chemotherapy, completing treatment in the springtime of 2015. [Per patient's report, chemotherapy was suggested, but she opted against this.]  The patient's subsequent history is as detailed below.   PAST MEDICAL HISTORY: Past Medical History:  Diagnosis Date   Anemia    Anxiety    Breast cancer ([place]) 09/2019   left breast IMC   CAD (coronary artery disease)    a. 3/2007s/p DES to the LAD [facility]); b. 01/2017 MV: EF 80%, small, mild apical ant defect w/o ischemia (felt to be breast atten). Low risk.   Depression    DM (diabetes mellitus) ([place])    Essential hypertension    GERD (gastroesophageal reflux disease)    Headache    secondary to neck surgery per patient   High triglycerides    Hyperlipidemia    Hypothyroid    OA (osteoarthritis)    right knee,hands   Overflow incontinence    PONV (postoperative nausea and vomiting)    s/p gallbladder    RLS (restless legs syndrome)    Urinary urgency    Uterine cancer ([place]) dx'd 2014    PAST SURGICAL HISTORY: Past Surgical History:  Procedure Laterality Date   BREAST LUMPECTOMY WITH RADIOACTIVE SEED AND SENTINEL LYMPH NODE BIOPSY Left 10/07/2019   Procedure: LEFT BREAST LUMPECTOMY WITH RADIOACTIVE SEED AND SENTINEL LYMPH NODE  BIOPSY;  Surgeon: Rolm Bookbinder, MD;  Location: [facility];  Service: General;  Laterality: Left;   CORONARY STENT PLACEMENT     CORONARY/GRAFT ACUTE MI REVASCULARIZATION     FINGER ARTHRODESIS Right 06/11/2019   Procedure: ARTHRODESIS INDEX FINGER DISTAL PHALANGEAL JOINT;  Surgeon: [name], MD;  Location: [facility];  Service: Orthopedics;  Laterality: Right;   FOOT SURGERY Left    LEFT HEART CATH AND CORONARY ANGIOGRAPHY N/A 02/16/2018   Procedure: LEFT HEART CATH AND CORONARY ANGIOGRAPHY;  Surgeon: Lorretta Harp, MD;  Location: [facility];  Service: Cardiovascular;  Laterality: N/A;   LEFT HEART CATH AND CORONARY ANGIOGRAPHY N/A 05/01/2020   Procedure: LEFT HEART CATH AND CORONARY ANGIOGRAPHY;  Surgeon: Lorretta Harp, MD;  Location: [facility];  Service: Cardiovascular;  Laterality: N/A;   PORTACATH PLACEMENT Right 11/23/2019   Procedure: INSERTION PORT-A-CATH WITH ULTRASOUND GUIDANCE;  Surgeon: Rolm Bookbinder, MD;  Location: [facility];  Service: General;  Laterality: Right;   REPLACEMENT TOTAL KNEE Right    SPINE SURGERY     TOTAL ABDOMINAL HYSTERECTOMY     FOR UTERUS CANCER   TOTAL HIP ARTHROPLASTY Left     FAMILY HISTORY: Family History  Problem Relation Age of Onset   AAA (abdominal aortic aneurysm) Mother    Diabetes Father    Alzheimer's disease Father    Throat cancer Sister    Breast cancer Cousin  Her father died at age 53 from Alzheimer's. Her mother died at age 82 from a ruptured abdominal aneurysm. Abagale has 2 brothers and 3 sisters. She reports breast cancer in a paternal cousin. She also notes throat cancer in her sister.   GYNECOLOGIC HISTORY:  No LMP recorded. Patient has had a hysterectomy. Menarche: 70 years old Age at first live birth: 70 years old [place] P 2 LMP 1996 Contraceptive: prior use without issue HRT never used  Hysterectomy? Yes, 2014 BSO? 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  She unpacks 40 pounds boxes of chicken, has to cook them in 13 pound cooking pans, and is generally on her feet at work but does not otherwise exercise.  She is divorced. She lives at home by herself, with no pets. Daughter Pershing Proud, age 69, is a radiology tech in [place]. Daughter Martyn Malay, age 62, is an Optometrist in [place], [place]. Gibson has one grandchild. She is not a Ambulance person.    ADVANCED DIRECTIVES: Not in place; intends to name daughter [name] as her HCPOA.   HEALTH MAINTENANCE: Social History   Tobacco Use   Smoking status: Never   Smokeless tobacco: Never  Vaping Use   Vaping Use: Never used  Substance Use Topics   Alcohol use: Yes    Comment: OCCASIONALLY   Drug use: No     Colonoscopy: <10 years ago, [place]  PAP: 2014?  Bone density: date unsure, also in [place]   Allergies  Allergen Reactions   Chloraprep One Step [Chlorhexidine Gluconate] Rash   Estrogens Other (See Comments)    PATIENT HAS A HISTORY OF CANCER AND HAS BEEN TOLD TO NEVER TAKE ANYTHING CONTAINING ESTROGEN, AS IT MIGHT CAUSE A RECURRENCE   Shrimp [Shellfish Allergy] Nausea And Vomiting    Current Outpatient Medications  Medication Sig Dispense Refill   ACCU-CHEK GUIDE test strip 1 each by Other route as needed.     albuterol (VENTOLIN HFA) 108 (90 Base) MCG/ACT inhaler Inhale 2 puffs into the lungs every 6 (six) hours as needed for wheezing or shortness of breath. 8 g 2   Ascorbic Acid (VITAMIN C PO) Take 1 tablet by mouth daily.     aspirin EC 81 MG tablet Take 81 mg by mouth daily.     atorvastatin (LIPITOR) 40 MG tablet TAKE 1 TABLET BY MOUTH EVERY DAY 90 tablet 1   bisoprolol (ZEBETA) 5 MG tablet TAKE 1 TABLET BY MOUTH EVERY DAY 90 tablet 2   Calcium Citrate-Vitamin D (CALCIUM + D PO) Take 1 tablet by mouth daily with supper.     CINNAMON PO Take 1 capsule by mouth daily with supper.     diclofenac  sodium (VOLTAREN) 1 % GEL Apply 2-4 g topically 4 (four) times daily as needed (as directed for pain).     Dulaglutide (TRULICITY) 1.5 CX/4.4YJ SOPN Inject 1.5 mg into the skin once a week.     escitalopram (LEXAPRO) 10 MG tablet Take 10 mg by mouth at bedtime.     Ferrous Sulfate (IRON PO) Take 1 tablet by mouth daily. alternates days:1 tablet one day and 2 tablets the next day     gabapentin (NEURONTIN) 300 MG capsule Take 1 capsule (300 mg total) by mouth at bedtime. 90 capsule 4   GLIPIZIDE XL 10 MG 24 hr tablet Take 20 mg by mouth daily.   3   JARDIANCE 25 MG TABS tablet Take  25 mg by mouth every morning.   3   levothyroxine (SYNTHROID) 137 MCG tablet Take 137 mcg by mouth every morning.     metFORMIN (GLUCOPHAGE) 500 MG tablet Take 500 mg by mouth 2 (two) times daily with a meal.     metroNIDAZOLE (METROGEL) 1 % gel Apply 1 application topically as needed (as directed to affected area). 45 g 2   Multiple Vitamins-Calcium (ONE-A-DAY WOMENS PO) Take 1 tablet by mouth daily.     nitroGLYCERIN (NITROSTAT) 0.4 MG SL tablet Place 1 tablet (0.4 mg total) under the tongue every 5 (five) minutes as needed for chest pain. 25 tablet 6   omeprazole (PRILOSEC) 40 MG capsule Take 40 mg by mouth daily with supper.      prochlorperazine (COMPAZINE) 10 MG tablet TAKE 1 TABLET BY MOUTH BEFORE MEALS AND AT BEDTIME ON DAYS 2 AND 3 AFTER CHEMOTHERAPY (COUNTING CHEMOTHERAPY DAY AS DAY 1) AFTER THAT MAY TAKE AS NEEDED 30 tablet 1   rOPINIRole (REQUIP) 0.5 MG tablet Take 0.5 mg by mouth at bedtime. 1-3 hours prior to bedtime     tiZANidine (ZANAFLEX) 4 MG tablet Take 4 mg by mouth every 8 (eight) hours as needed for muscle spasms. As needed     traMADol (ULTRAM) 50 MG tablet Take 1 tablet (50 mg total) by mouth every 6 (six) hours as needed for moderate pain. 30 tablet 0   TURMERIC PO Take 1 capsule by mouth daily with supper.     No current facility-administered medications for this visit.    OBJECTIVE:   Vitals:   09/15/20 1110  BP: 139/72  Pulse: 86  Resp: 18  Temp: (!) 97.5 F (36.4 C)  SpO2: 96%     Body mass index is 28.03 kg/m.   Wt Readings from Last 3 Encounters:  09/15/20 163 lb 4.8 oz (74.1 kg)  08/25/20 160 lb 8 oz (72.8 kg)  08/04/20 164 lb 6 oz (74.6 kg)  ECOG FS:1 - Symptomatic but completely ambulatory GENERAL: Patient is a well appearing female in no acute distress HEENT:  Sclerae anicteric.  Mask in place. Neck is supple.  NODES:  No cervical, supraclavicular, or axillary lymphadenopathy palpated.  BREAST EXAM:  deferred today LUNGS:  Clear to auscultation bilaterally.  No wheezes or rhonchi. HEART:  Regular rate and rhythm. No murmur appreciated. ABDOMEN:  Soft, nontender.  Positive, normoactive bowel sounds. No organomegaly palpated. MSK:  No focal spinal tenderness to palpation. Full range of motion bilaterally in the upper extremities. EXTREMITIES:  No peripheral edema.   SKIN:  Clear with no obvious rashes or skin changes. No nail dyscrasia. NEURO:  Nonfocal. Well oriented.  Appropriate affect.     LAB RESULTS:  CMP     Component Value Date/Time   NA 138 08/25/2020 1125   NA 139 11/25/2018 1053   K 4.0 08/25/2020 1125   CL 102 08/25/2020 1125   CO2 23 08/25/2020 1125   GLUCOSE 300 (H) 08/25/2020 1125   BUN 13 08/25/2020 1125   BUN 18 11/25/2018 1053   CREATININE 0.82 08/25/2020 1125   CALCIUM 10.4 (H) 08/25/2020 1125   PROT 7.6 08/25/2020 1125   PROT 7.1 11/25/2018 1053   ALBUMIN 3.8 08/25/2020 1125   ALBUMIN 4.2 11/25/2018 1053   AST 45 (H) 08/25/2020 1125   ALT 41 08/25/2020 1125   ALKPHOS 79 08/25/2020 1125   BILITOT 0.5 08/25/2020 1125   GFRNONAA >60 08/25/2020 1125   GFRAA >60 10/04/2019 1330   GFRAA >  60 09/08/2019 0811    No results found for: TOTALPROTELP, ALBUMINELP, A1GS, A2GS, BETS, BETA2SER, GAMS, MSPIKE, SPEI  Lab Results  Component Value Date   WBC 7.6 09/15/2020   NEUTROABS 3.9 09/15/2020   HGB 13.8 09/15/2020   HCT  42.0 09/15/2020   MCV 95.2 09/15/2020   PLT 149 (L) 09/15/2020    No results found for: LABCA2  No components found for: [id number]  No results for input(s): INR in the last 168 hours.  No results found for: LABCA2  No results found for: GYF749  No results found for: SWH675  No results found for: FFM384  No results found for: CA2729  No components found for: HGQUANT  No results found for: CEA1 / No results found for: CEA1   No results found for: AFPTUMOR  No results found for: CHROMOGRNA  No results found for: KPAFRELGTCHN, LAMBDASER, KAPLAMBRATIO (kappa/lambda light chains)  No results found for: HGBA, HGBA2QUANT, HGBFQUANT, HGBSQUAN (Hemoglobinopathy evaluation)   No results found for: LDH  No results found for: IRON, TIBC, IRONPCTSAT (Iron and TIBC)  No results found for: FERRITIN  Urinalysis No results found for: COLORURINE, APPEARANCEUR, LABSPEC, PHURINE, GLUCOSEU, HGBUR, BILIRUBINUR, KETONESUR, PROTEINUR, UROBILINOGEN, NITRITE, LEUKOCYTESUR   STUDIES: No results found.   ELIGIBLE FOR AVAILABLE RESEARCH PROTOCOL: AET  ASSESSMENT: 70 y.o. [place] woman status post left breast upper outer quadrant biopsy 08/30/2019 for a clinical T1c N0, stage IA invasive ductal carcinoma, grade 2, estrogen and progesterone receptor positive, HER-2 amplified, with an MIB-1 of 20%.  (0) status post TAH/BSO 2014 for a FIGO grade 3, stage IA endometrial carcinoma, status post vaginal brachytherapy, no adjuvant chemotherapy  (1) status post left lumpectomy and axillary sentinel node sampling 10/07/2019 for a pT1c pN1, stage IB invasive ductal carcinoma, grade 2, with negative margins.  (a) a total of 1 sentinel lymph node was removed.  (2) adjuvant chemotherapy consisting of carboplatin, gemcitabine, trastuzumab given every 3 weeks x 6 started 11/24/2019, last dose 01/26/2020  (a) baseline echo 09/15/2019 shows an ejection fraction in the 60-65% range.  (b) will not use  docetaxel secondary to severe peripheral neuropathy present at baseline  (c) echo on 01/17/2020 shows EF of 60-65%  (d) chemotherapy discontinued after 4 cycles with poor tolerance.  (3) T-DM1 started 02/16/2020, to be continued through November 2022  (a) echo 5/24 EF 60-65%  (B) echo on 08/14/2020  (4) adjuvant radiation completed in 03/30/2020-05/12/2020 Site Technique Total Dose (Gy) Dose per Fx (Gy) Completed Fx Beam Energies  Breast, Left: Breast_Lt 3D 50/50 2 25/25 10X  Breast, Left: Breast_Lt_SCV_PAB 3D 50/50 2 25/25 10X, 15X  Breast, Left: Breast_Lt_Bst 3D 10/10 2 5/5 6X, 10X    (5) antiestrogens to start at the completion of local treatment   PLAN: Jannette is here today for continued treatment with TDM1 for her triple positive breast cancer.  She is tolerating treatment well.  We reviewed her breast cancer is triple positive.  She will need to start on Anastrozole.  I reviewed with her how anastrozole works.  Per my discussion with Dr. Jana Hakim about Pamala Hurry, we are waiting until she finishes TDM1 to start Anastrozole.    Her shortness of breath is improving.  Considering her symptom improvement and xray showing some mild radiation changes, we will continue to follow her symptoms.  I suggested she consider CT chest, however she has opted to wait on that for now.    Pamala Hurry and I reviewed her financial state.  This is causing some situational  depression.  I spoke with Garnet Koyanagi about the situation and she is working with her and notes some positive leads on new housing and is optimistic that if Andrea goes to the places suggested she will find more affordable housing.   Nguyen will return every three weeks for labs and TDM1.  She will see Dr. Jana Hakim in 9 weeks at the completion of her treatment. She knows to call for any questions that may arise between now and her next appointment.  We are happy to see her sooner if needed.  I spent approximately 30 minutes reviewing the  assessment plan with patient, recommendations, and in the visit.  I also spent that time documenting the note above.    Wilber Bihari, NP 09/15/20 11:15 AM Medical Oncology and Hematology [facility] [place], [place] [address] Tel. [phone]    Fax. [phone]  .  *Total Encounter Time as defined by the Centers for Medicare and Medicaid Services includes, in addition to the face-to-face time of a patient visit (documented in the note above) non-face-to-face time: obtaining and reviewing outside history, ordering and reviewing medications, tests or procedures, care coordination (communications with other health care professionals or caregivers) and documentation in the medical record.

## 2020-09-15 NOTE — Patient Instructions (Signed)
[facility]ICAL [facility]OG[facility]  Discharge Instructions: °Thank you for choosing [facility] to provide your oncology and hematology care.  ° °If you have a lab appointment with the Cancer Center, please go directly to the Cancer Center and check in at the registration area. °  °Wear comfortable clothing and clothing appropriate for easy access to any Portacath or PICC line.  ° °We strive to give you quality time with your provider. You may need to reschedule your appointment if you arrive late (15 or more minutes).  Arriving late affects you and other patients whose appointments are after yours.  Also, if you miss three or more appointments without notifying the office, you may be dismissed from the clinic at the provider’s discretion.    °  °For prescription refill requests, have your pharmacy contact our office and allow 72 hours for refills to be completed.   ° °Today you received the following chemotherapy and/or immunotherapy agents [place]cy[place]    °  °To help prevent nausea and vomiting after your treatment, we encourage you to take your nausea medication as directed. ° °BELOW ARE SYMPTOMS THAT SHOULD BE REPORTED IMMEDIATELY: °*FEVER GREATER THAN 100.4 F (38 °C) OR HIGHER °*CHILLS OR SWEATING °*NAUSEA AND VOMITING THAT IS NOT CONTROLLED WITH YOUR NAUSEA MEDICATION °*UNUSUAL SHORTNESS OF BREATH °*UNUSUAL BRUISING OR BLEEDING °*URINARY PROBLEMS (pain or burning when urinating, or frequent urination) °*BOWEL PROBLEMS (unusual diarrhea, constipation, pain near the anus) °TENDERNESS IN MOUTH AND THROAT WITH OR WITHOUT PRESENCE OF ULCERS (sore throat, sores in mouth, or a toothache) °UNUSUAL RASH, SWELLING OR PAIN  °UNUSUAL VAGINAL DISCHARGE OR ITCHING  ° °Items with * indicate a potential emergency and should be followed up as soon as possible or go to the Emergency Department if any problems should occur. ° °Please show the CHEMOTHERAPY ALERT CARD or IMMUNOTHERAPY ALERT CARD at check-in to the  Emergency Department and triage nurse. ° °Should you have questions after your visit or need to cancel or reschedule your appointment, please contact [facility]  Dept: [phone]  and follow the prompts.  Office hours are 8:00 a.m. to 4:30 p.m. Monday - Friday. Please note that voicemails left after 4:00 p.m. may not be returned until the following business day.  We are closed weekends and major holidays. You have access to a nurse at all times for urgent questions. Please call the main number to the clinic Dept: [phone] and follow the prompts. ° ° °For any non-urgent questions, you may also contact your provider using MyChart. We now offer e-Visits for anyone 18 and older to request care online for non-urgent symptoms. For details visit [email or web address]. °  °Also download the MyChart app! Go to the app store, search "MyChart", open the app, select [facility], and log in with your MyChart username and password. ° °Due to Covid, a mask is required upon entering the hospital/clinic. If you do not have a mask, one will be given to you upon arrival. For doctor visits, patients may have 1 support person aged 18 or older with them. For treatment visits, patients cannot have anyone with them due to current Covid guidelines and our immunocompromised population.  ° °

## 2020-09-15 NOTE — Progress Notes (Signed)
[facility] CSW Progress Note  Holiday representative met with patient in infusion to follow-up on applications for assistance from breast cancer foundations. Patient provided some supporting documents and is continuing to complete applications for [employer] in [place]. Pt will work on gathering remaining documents and bring those and applications to [place].  Pt has been looking for a less expensive apartment and is on waitlists for income-based housing. She is stressed about potentially losing her car due to being behind on payments. She is not currently able to work to supplement income and daughter unable to help at this time.  CSW provided information for [employer] emergency assistance and energy assistance program through DSS. Pt also agreed to referral to [facility] center for housing & community studies to determine if further help is available and to [employer], [facility] for counseling. Referrals made through [facility]

## 2020-09-17 ENCOUNTER — Encounter: Payer: Self-pay | Admitting: Oncology

## 2020-09-21 ENCOUNTER — Encounter: Payer: Self-pay | Admitting: Licensed Clinical Social Worker

## 2020-09-21 ENCOUNTER — Encounter: Payer: Self-pay | Admitting: *Deleted

## 2020-09-21 NOTE — Progress Notes (Signed)
[facility]erve Work   Patient brought in some additional paperwork and CSW was able to submit [employer] in [place] application via fax. Waiting on a few more documents for [employer] application which patient will work on obtaining.  Pt noted that she has received an eviction notice. She has connected with Stefanie at [facility] who has provided routes for rent and utility assistance as well as a list of low-income housing vacancies.      Christeen Douglas, LCSW

## 2020-09-23 NOTE — Progress Notes (Signed)
Brief telephone encounter note on 08/24/20 documented. Discussed results. Advised to change Advair to PRN

## 2020-09-25 ENCOUNTER — Encounter: Payer: Self-pay | Admitting: Licensed Clinical Social Worker

## 2020-09-25 ENCOUNTER — Other Ambulatory Visit: Payer: Self-pay

## 2020-09-25 ENCOUNTER — Ambulatory Visit: Payer: Medicare Other | Attending: General Surgery

## 2020-09-25 VITALS — Wt 152.0 lb

## 2020-09-25 DIAGNOSIS — Z483 Aftercare following surgery for neoplasm: Secondary | ICD-10-CM

## 2020-09-25 NOTE — Therapy (Signed)
[place], [place], [address] Phone: [phone]   Fax:  [phone]  Physical Therapy Treatment  Patient Details  Name: Felicia [name] MRN: [id number] Date of Birth: 06/17/1950 Referring Provider (PT): Dr. Jana Hakim   Encounter Date: 09/25/2020   PT End of Session - 09/25/20 1616     Visit Number 3   # unchanged due to screen only   Number of Visits 11    PT Start Time 1509    PT Stop Time 1519    PT Time Calculation (min) 10 min    Activity Tolerance Patient tolerated treatment well    Behavior During Therapy [facility] for tasks assessed/performed             Past Medical History:  Diagnosis Date   Anemia    Anxiety    Breast cancer ([place]) 09/2019   left breast IMC   CAD (coronary artery disease)    a. 3/2007s/p DES to the LAD [facility]); b. 01/2017 MV: EF 80%, small, mild apical ant defect w/o ischemia (felt to be breast atten). Low risk.   Depression    DM (diabetes mellitus) ([place])    Essential hypertension    GERD (gastroesophageal reflux disease)    Headache    secondary to neck surgery per patient   High triglycerides    Hyperlipidemia    Hypothyroid    OA (osteoarthritis)    right knee,hands   Overflow incontinence    PONV (postoperative nausea and vomiting)    s/p gallbladder    RLS (restless legs syndrome)    Urinary urgency    Uterine cancer ([place]) dx'd 2014    Past Surgical History:  Procedure Laterality Date   BREAST LUMPECTOMY WITH RADIOACTIVE SEED AND SENTINEL LYMPH NODE BIOPSY Left 10/07/2019   Procedure: LEFT BREAST LUMPECTOMY WITH RADIOACTIVE SEED AND SENTINEL LYMPH NODE BIOPSY;  Surgeon: Rolm Bookbinder, MD;  Location: [facility];  Service: General;  Laterality: Left;   CORONARY STENT PLACEMENT     CORONARY/GRAFT ACUTE MI REVASCULARIZATION     FINGER ARTHRODESIS Right 06/11/2019   Procedure: ARTHRODESIS INDEX FINGER DISTAL PHALANGEAL JOINT;   Surgeon: [name], MD;  Location: [facility];  Service: Orthopedics;  Laterality: Right;   FOOT SURGERY Left    LEFT HEART CATH AND CORONARY ANGIOGRAPHY N/A 02/16/2018   Procedure: LEFT HEART CATH AND CORONARY ANGIOGRAPHY;  Surgeon: Lorretta Harp, MD;  Location: [place] CV LAB;  Service: Cardiovascular;  Laterality: N/A;   LEFT HEART CATH AND CORONARY ANGIOGRAPHY N/A 05/01/2020   Procedure: LEFT HEART CATH AND CORONARY ANGIOGRAPHY;  Surgeon: Lorretta Harp, MD;  Location: [place] CV LAB;  Service: Cardiovascular;  Laterality: N/A;   PORTACATH PLACEMENT Right 11/23/2019   Procedure: INSERTION PORT-A-CATH WITH ULTRASOUND GUIDANCE;  Surgeon: Rolm Bookbinder, MD;  Location: [place];  Service: General;  Laterality: Right;   REPLACEMENT TOTAL KNEE Right    SPINE SURGERY     TOTAL ABDOMINAL HYSTERECTOMY     FOR UTERUS CANCER   TOTAL HIP ARTHROPLASTY Left     Vitals:   09/25/20 1612  Weight: 152 lb (68.9 kg)     Subjective Assessment - 09/25/20 1611     Subjective Pt returns for her 3 month L-Dex screen. "I almost fell yesterday because my Rt knee buckled on me pretty good. 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 She underwent a left lumpectomy and 1 positive node removed on 10/07/2019. Ki67 is 20%. She has a history of uterine cancer in 2014 treated with hysterectomy and radiation, a left hip replacement, right knee replacement, and a C4-C7 fusion in 2014.                    L-DEX FLOWSHEETS - 09/25/20 1600       L-DEX LYMPHEDEMA SCREENING   Measurement Type Unilateral    L-DEX MEASUREMENT EXTREMITY Upper Extremity    POSITION  Standing    DOMINANT SIDE Right    At Risk Side Left    BASELINE SCORE (UNILATERAL) 3.3    L-DEX SCORE (UNILATERAL) 7.5    VALUE CHANGE (UNILAT) 4.2                                      PT Long Term Goals - 01/21/20 1056       PT LONG TERM GOAL #1   Title Pt will report ability to get off the commode without pulling on the vanity    Time 8    Period Weeks    Status New      PT LONG TERM GOAL #2   Title Pt will improve BERG balance score to 45 or better to decrease risk of falls    Time 8    Period Weeks    Status New      PT LONG TERM GOAL #3   Title Pt will report no LOB backwards with standing activities x 1 week    Time 8    Period Weeks      PT LONG TERM GOAL #4   Title Pt will be ind with HEP similar to OTAGO    Time 8    Period Weeks    Status New                   Plan - 09/25/20 1619     Clinical Impression Statement Pt returns for her 3 month L-Dex screen. Her change from baseline of 4.2 is WNLs so no furhter treatment is required at this time except to cont L-Dex screens every 3 months which pt is agreeable to. Pt sees a new PCP next week and she plans to talk with them about her Rt knee buckling on her and knows she can return for physical therapy prn.    PT Next Visit Plan Cont every 3 month L-Dex screens for up to 2 years from [facility].    Consulted and Agree with Plan of Care Patient             Patient will benefit from skilled therapeutic intervention in order to improve the following deficits and impairments:     Visit Diagnosis: Aftercare following surgery for neoplasm     Problem List Patient Active Problem List   Diagnosis Date Noted   Centrilobular emphysema ([place]) 07/31/2020   Shortness of breath 07/31/2020   Pressure injury of skin 04/30/2020   Hypothyroid    Anxiety    Depression    Port-A-Cath in place 11/24/2019   Hepatic steatosis 09/28/2019   Malignant neoplasm of upper-outer quadrant of left breast in female, estrogen receptor positive ([place]) 09/02/2019   Endometrial cancer ([place]) 10/23/2018   Cirrhosis of liver without ascites ([place]) 10/23/2018   Diarrhea  10/23/2018  Chest pain 02/15/2018   Coronary artery disease involving native coronary artery of native heart without angina pectoris 11/18/2017   Hyperlipidemia LDL goal <70 11/18/2017   Type 2 diabetes mellitus without complication, without long-term current use of insulin ([place]) 11/18/2017   Dizziness 11/18/2017    Otelia Limes, PTA 09/25/2020, 4:21 PM  [place], [place], [address] Phone: [phone]   Fax:  [phone]  Name: Felicia [name] MRN: [id number] Date of Birth: Jul 29, 1950

## 2020-09-25 NOTE — Progress Notes (Signed)
[place] CSW Progress Note  Patient brought in completed [employer] application & CSW submitted today with supporting letter. Pt continues to work on gathering supporting documents for [employer] application and will bring in when available.   Christeen Douglas , LCSW

## 2020-09-28 ENCOUNTER — Encounter: Payer: Self-pay | Admitting: Licensed Clinical Social Worker

## 2020-09-28 NOTE — Progress Notes (Signed)
[place] CSW Progress Note  Clinical Education officer, museum received notice from [place] with [employer] in [place] that patient has been approved for assistance. [employer] will also send notice to patient.    Christeen Douglas , LCSW

## 2020-10-05 ENCOUNTER — Inpatient Hospital Stay: Payer: Medicare Other

## 2020-10-05 ENCOUNTER — Other Ambulatory Visit: Payer: Self-pay

## 2020-10-05 VITALS — BP 112/73 | HR 66 | Temp 97.5°F | Resp 16 | Wt 160.5 lb

## 2020-10-05 DIAGNOSIS — C50412 Malignant neoplasm of upper-outer quadrant of left female breast: Secondary | ICD-10-CM

## 2020-10-05 DIAGNOSIS — Z170 Estrogen receptor positive status [ER+]: Secondary | ICD-10-CM

## 2020-10-05 DIAGNOSIS — Z5112 Encounter for antineoplastic immunotherapy: Secondary | ICD-10-CM | POA: Diagnosis not present

## 2020-10-05 DIAGNOSIS — Z95828 Presence of other vascular implants and grafts: Secondary | ICD-10-CM

## 2020-10-05 LAB — CMP (CANCER CENTER ONLY)
ALT: 41 U/L (ref 0–44)
AST: 42 U/L — ABNORMAL HIGH (ref 15–41)
Albumin: 3.8 g/dL (ref 3.5–5.0)
Alkaline Phosphatase: 74 U/L (ref 38–126)
Anion gap: 13 (ref 5–15)
BUN: 12 mg/dL (ref 8–23)
CO2: 24 mmol/L (ref 22–32)
Calcium: 10.4 mg/dL — ABNORMAL HIGH (ref 8.9–10.3)
Chloride: 102 mmol/L (ref 98–111)
Creatinine: 0.74 mg/dL (ref 0.44–1.00)
GFR, Estimated: >60 mL/min (ref >60)
Glucose, Bld: 272 mg/dL — ABNORMAL HIGH (ref 70–99)
Potassium: 4 mmol/L (ref 3.5–5.1)
Sodium: 139 mmol/L (ref 135–145)
Total Bilirubin: 0.4 mg/dL (ref 0.3–1.2)
Total Protein: 7.2 g/dL (ref 6.5–8.1)

## 2020-10-05 LAB — CBC WITH DIFFERENTIAL (CANCER CENTER ONLY)
Abs Immature Granulocytes: 0.02 10*3/uL (ref 0.00–0.07)
Basophils Absolute: 0 10*3/uL (ref 0.0–0.1)
Basophils Relative: 0 %
Eosinophils Absolute: 0.1 10*3/uL (ref 0.0–0.5)
Eosinophils Relative: 1 %
HCT: 43.3 % (ref 36.0–46.0)
Hemoglobin: 13.9 g/dL (ref 12.0–15.0)
Immature Granulocytes: 0 %
Lymphocytes Relative: 42 %
Lymphs Abs: 2.9 10*3/uL (ref 0.7–4.0)
MCH: 31.1 pg (ref 26.0–34.0)
MCHC: 32.1 g/dL (ref 30.0–36.0)
MCV: 96.9 fL (ref 80.0–100.0)
Monocytes Absolute: 0.6 10*3/uL (ref 0.1–1.0)
Monocytes Relative: 8 %
Neutro Abs: 3.4 10*3/uL (ref 1.7–7.7)
Neutrophils Relative %: 49 %
Platelet Count: 162 10*3/uL (ref 150–400)
RBC: 4.47 MIL/uL (ref 3.87–5.11)
RDW: 15.4 % (ref 11.5–15.5)
WBC Count: 7 10*3/uL (ref 4.0–10.5)
nRBC: 0 % (ref 0.0–0.2)

## 2020-10-05 MED ORDER — LORAZEPAM 2 MG/ML IJ SOLN
0.5000 mg | Freq: Once | INTRAMUSCULAR | Status: AC
  Administered 2020-10-05: 0.5 mg via INTRAVENOUS
  Filled 2020-10-05: qty 1

## 2020-10-05 MED ORDER — DIPHENHYDRAMINE HCL 25 MG PO CAPS
25.0000 mg | ORAL_CAPSULE | Freq: Once | ORAL | Status: AC
  Administered 2020-10-05: 25 mg via ORAL
  Filled 2020-10-05: qty 1

## 2020-10-05 MED ORDER — ACETAMINOPHEN 325 MG PO TABS
650.0000 mg | ORAL_TABLET | Freq: Once | ORAL | Status: AC
  Administered 2020-10-05: 650 mg via ORAL
  Filled 2020-10-05: qty 2

## 2020-10-05 MED ORDER — SODIUM CHLORIDE 0.9 % IV SOLN: Freq: Once | INTRAVENOUS | Status: AC

## 2020-10-05 MED ORDER — SODIUM CHLORIDE 0.9 % IV SOLN
260.0000 mg | Freq: Once | INTRAVENOUS | Status: AC
  Administered 2020-10-05: 260 mg via INTRAVENOUS
  Filled 2020-10-05: qty 8

## 2020-10-05 MED ORDER — SODIUM CHLORIDE 0.9% FLUSH
10.0000 mL | INTRAVENOUS | Status: DC | PRN
  Administered 2020-10-05: 10 mL

## 2020-10-05 MED ORDER — HEPARIN SOD (PORK) LOCK FLUSH 100 UNIT/ML IV SOLN
500.0000 [IU] | Freq: Once | INTRAVENOUS | Status: AC | PRN
  Administered 2020-10-05: 500 [IU]

## 2020-10-05 MED ORDER — SODIUM CHLORIDE 0.9% FLUSH
10.0000 mL | Freq: Once | INTRAVENOUS | Status: AC
  Administered 2020-10-05: 10 mL

## 2020-10-05 NOTE — Progress Notes (Signed)
Ok to decrease Kadcyla dose to 260mg  per MD.  Acquanetta Belling, RPH, BCP, BCOP 10/05/2020 2:00 PM

## 2020-10-05 NOTE — Progress Notes (Signed)
Patient was observed for 30 minutes post Kadcyla infusion with no difficulties. Vitals stable and patient in no distress upon leaving infusion clinic.

## 2020-10-05 NOTE — Patient Instructions (Signed)
[facility]  Discharge Instructions: °Thank you for choosing [facility] to provide your oncology and hematology care.  ° °If you have a lab appointment with the Cancer Center, please go directly to the Cancer Center and check in at the registration area. °  °Wear comfortable clothing and clothing appropriate for easy access to any Portacath or PICC line.  ° °We strive to give you quality time with your provider. You may need to reschedule your appointment if you arrive late (15 or more minutes).  Arriving late affects you and other patients whose appointments are after yours.  Also, if you miss three or more appointments without notifying the office, you may be dismissed from the clinic at the provider’s discretion.    °  °For prescription refill requests, have your pharmacy contact our office and allow 72 hours for refills to be completed.   ° °Today you received the following chemotherapy and/or immunotherapy agents Kadcyla    °  °To help prevent nausea and vomiting after your treatment, we encourage you to take your nausea medication as directed. ° °BELOW ARE SYMPTOMS THAT SHOULD BE REPORTED IMMEDIATELY: °*FEVER GREATER THAN 100.4 F (38 °C) OR HIGHER °*CHILLS OR SWEATING °*NAUSEA AND VOMITING THAT IS NOT CONTROLLED WITH YOUR NAUSEA MEDICATION °*UNUSUAL SHORTNESS OF BREATH °*UNUSUAL BRUISING OR BLEEDING °*URINARY PROBLEMS (pain or burning when urinating, or frequent urination) °*BOWEL PROBLEMS (unusual diarrhea, constipation, pain near the anus) °TENDERNESS IN MOUTH AND THROAT WITH OR WITHOUT PRESENCE OF ULCERS (sore throat, sores in mouth, or a toothache) °UNUSUAL RASH, SWELLING OR PAIN  °UNUSUAL VAGINAL DISCHARGE OR ITCHING  ° °Items with * indicate a potential emergency and should be followed up as soon as possible or go to the Emergency Department if any problems should occur. ° °Please show the CHEMOTHERAPY ALERT CARD or IMMUNOTHERAPY ALERT CARD at check-in to the  Emergency Department and triage nurse. ° °Should you have questions after your visit or need to cancel or reschedule your appointment, please contact [facility]  Dept: [phone]  and follow the prompts.  Office hours are 8:00 a.m. to 4:30 p.m. Monday - Friday. Please note that voicemails left after 4:00 p.m. may not be returned until the following business day.  We are closed weekends and major holidays. You have access to a nurse at all times for urgent questions. Please call the main number to the clinic Dept: [phone] and follow the prompts. ° ° °For any non-urgent questions, you may also contact your provider using MyChart. We now offer e-Visits for anyone 18 and older to request care online for non-urgent symptoms. For details visit [email or web address]. °  °Also download the MyChart app! Go to the app store, search "MyChart", open the app, select [facility], and log in with your MyChart username and password. ° °Due to Covid, a mask is required upon entering the hospital/clinic. If you do not have a mask, one will be given to you upon arrival. For doctor visits, patients may have 1 support person aged 18 or older with them. For treatment visits, patients cannot have anyone with them due to current Covid guidelines and our immunocompromised population.  ° °

## 2020-10-06 ENCOUNTER — Other Ambulatory Visit: Payer: Self-pay | Admitting: Cardiovascular Disease

## 2020-10-06 ENCOUNTER — Encounter: Payer: Self-pay | Admitting: Physician Assistant

## 2020-10-06 ENCOUNTER — Ambulatory Visit (INDEPENDENT_AMBULATORY_CARE_PROVIDER_SITE_OTHER): Payer: Medicare Other | Admitting: Physician Assistant

## 2020-10-06 VITALS — BP 112/73 | HR 71 | Temp 98.2°F | Ht 64.0 in | Wt 161.2 lb

## 2020-10-06 DIAGNOSIS — Z170 Estrogen receptor positive status [ER+]: Secondary | ICD-10-CM

## 2020-10-06 DIAGNOSIS — C50412 Malignant neoplasm of upper-outer quadrant of left female breast: Secondary | ICD-10-CM | POA: Diagnosis not present

## 2020-10-06 DIAGNOSIS — J432 Centrilobular emphysema: Secondary | ICD-10-CM

## 2020-10-06 DIAGNOSIS — E038 Other specified hypothyroidism: Secondary | ICD-10-CM

## 2020-10-06 DIAGNOSIS — E119 Type 2 diabetes mellitus without complications: Secondary | ICD-10-CM | POA: Diagnosis not present

## 2020-10-06 LAB — TSH: TSH: 0.76 u[IU]/mL (ref 0.35–5.50)

## 2020-10-06 LAB — HEMOGLOBIN A1C: Hgb A1c MFr Bld: 8.3 % — ABNORMAL HIGH (ref 4.6–6.5)

## 2020-10-06 NOTE — Patient Instructions (Signed)
Good to meet you today! Please call back with refills you are needing. I will call lab to see if we can get TSH and Ha1c added onto yesterday's labs. Best wishes on your last chemo treatments!

## 2020-10-06 NOTE — Progress Notes (Signed)
Subjective:    Patient ID: Felicia [name], female    DOB: [birth year], 70 y.o.   MRN: [id number]  Chief Complaint  Patient presents with   Establish Care    HPI Patient is in today for new patient establishment.  Needs primary to take over care for her diabetes and thyroid issues.   Last Ha1c was 8.5 on 04-30-20. Current regimen is Metformin 500 mg BID. Jardiance in the am, and 2 Glipizide in the morning, and Trulicity.   Also, currently undergoing treatment for breast cancer. States that she has two more treatments to go. States that she was complaining of left sided chest pain and difficulty breathing during radiation treatment. Went to cardiologist and told she has emphysema. Cardiology referred her to pulmonology. She was started on a daily inhaler and says she hasn't been taking it as directed because she feels like it isn't helping much.  She will be done with chemotherapy treatment in November 2022.   Past Medical History:  Diagnosis Date   Anemia    Anxiety    Breast cancer ([place]) 09/2019   left breast IMC   CAD (coronary artery disease)    a. 3/2007s/p DES to the LAD [facility]); b. 01/2017 MV: EF 80%, small, mild apical ant defect w/o ischemia (felt to be breast atten). Low risk.   Depression    DM (diabetes mellitus) ([place])    Essential hypertension    GERD (gastroesophageal reflux disease)    Headache    secondary to neck surgery per patient   High triglycerides    Hyperlipidemia    Hypothyroid    OA (osteoarthritis)    right knee,hands   Overflow incontinence    PONV (postoperative nausea and vomiting)    s/p gallbladder    RLS (restless legs syndrome)    Urinary urgency    Uterine cancer ([place]) dx'd 2014    Past Surgical History:  Procedure Laterality Date   BREAST LUMPECTOMY WITH RADIOACTIVE SEED AND SENTINEL LYMPH NODE BIOPSY Left 10/07/2019   Procedure: LEFT BREAST LUMPECTOMY WITH RADIOACTIVE SEED AND SENTINEL LYMPH NODE BIOPSY;  Surgeon: Rolm Bookbinder, MD;  Location: [facility];  Service: General;  Laterality: Left;   CORONARY STENT PLACEMENT     CORONARY/GRAFT ACUTE MI REVASCULARIZATION     FINGER ARTHRODESIS Right 06/11/2019   Procedure: ARTHRODESIS INDEX FINGER DISTAL PHALANGEAL JOINT;  Surgeon: [name], MD;  Location: [facility];  Service: Orthopedics;  Laterality: Right;   FOOT SURGERY Left    LEFT HEART CATH AND CORONARY ANGIOGRAPHY N/A 02/16/2018   Procedure: LEFT HEART CATH AND CORONARY ANGIOGRAPHY;  Surgeon: Lorretta Harp, MD;  Location: [facility] CV LAB;  Service: Cardiovascular;  Laterality: N/A;   LEFT HEART CATH AND CORONARY ANGIOGRAPHY N/A 05/01/2020   Procedure: LEFT HEART CATH AND CORONARY ANGIOGRAPHY;  Surgeon: Lorretta Harp, MD;  Location: [place] CV LAB;  Service: Cardiovascular;  Laterality: N/A;   PORTACATH PLACEMENT Right 11/23/2019   Procedure: INSERTION PORT-A-CATH WITH ULTRASOUND GUIDANCE;  Surgeon: Rolm Bookbinder, MD;  Location: [facility];  Service: General;  Laterality: Right;   REPLACEMENT TOTAL KNEE Right    SPINE SURGERY     TOTAL ABDOMINAL HYSTERECTOMY     FOR UTERUS CANCER   TOTAL HIP ARTHROPLASTY Left     Family History  Problem Relation Age of Onset   AAA (abdominal aortic aneurysm) Mother    Diabetes Father    Alzheimer's disease Father  Throat cancer Sister    Breast cancer Cousin     Social History   Tobacco Use   Smoking status: Never   Smokeless tobacco: Never  Vaping Use   Vaping Use: Never used  Substance Use Topics   Alcohol use: Yes    Comment: OCCASIONALLY   Drug use: No     Allergies  Allergen Reactions   Chloraprep One Step [Chlorhexidine Gluconate] Rash   Estrogens Other (See Comments)    PATIENT HAS A HISTORY OF CANCER AND HAS BEEN TOLD TO NEVER TAKE ANYTHING CONTAINING ESTROGEN, AS IT MIGHT CAUSE A RECURRENCE   Shrimp [Shellfish Allergy] Nausea And Vomiting    Review of Systems REFER TO HPI  FOR PERTINENT POSITIVES AND NEGATIVES      Objective:     There were no vitals taken for this visit.  Wt Readings from Last 3 Encounters:  10/05/20 160 lb 8 oz (72.8 kg)  09/25/20 152 lb (68.9 kg)  09/15/20 163 lb 4.8 oz (74.1 kg)    BP Readings from Last 3 Encounters:  10/05/20 112/73  09/15/20 139/72  08/25/20 135/79     Physical Exam Vitals and nursing note reviewed.  Constitutional:      Appearance: Normal appearance. She is normal weight. She is not toxic-appearing.  HENT:     Head: Normocephalic and atraumatic.     Right Ear: External ear normal.     Left Ear: External ear normal.     Nose: Nose normal.     Mouth/Throat:     Mouth: Mucous membranes are moist.  Eyes:     Extraocular Movements: Extraocular movements intact.     Conjunctiva/sclera: Conjunctivae normal.     Pupils: Pupils are equal, round, and reactive to light.  Cardiovascular:     Rate and Rhythm: Normal rate and regular rhythm.     Pulses: Normal pulses.     Heart sounds: Normal heart sounds.  Pulmonary:     Effort: Pulmonary effort is normal.     Breath sounds: Normal breath sounds.  Musculoskeletal:        General: Normal range of motion.     Cervical back: Normal range of motion and neck supple.  Skin:    General: Skin is warm and dry.  Neurological:     General: No focal deficit present.     Mental Status: She is alert and oriented to person, place, and time.  Psychiatric:        Mood and Affect: Mood normal.        Behavior: Behavior normal.        Thought Content: Thought content normal.        Judgment: Judgment normal.       Assessment & Plan:   Problem List Items Addressed This Visit   None   1. Type 2 diabetes mellitus without complication, without long-term current use of insulin (H[facility]) -Need Ha1c added to last labs -She knows that trend has been higher this year 2/2 stress associated with her breast cancer -Taking medications as directed  2. Other specified  hypothyroidism -Needs TSH added to recent labs -Synthroid 137 mcg daily  3. Centrilobular emphysema ([place]) -F/up with pulmonology -Encouraged her to take daily inhaler to help prevent worsening disease and decrease chance of hospitalization  4. Malignant neoplasm of upper-outer quadrant of left breast in female, estrogen receptor positive ([place]) -Undergoing treatment, scheduled to complete in Nov this year -F/up with oncology   This note was prepared with assistance  of Systems analyst. Occasional wrong-word or sound-a-like substitutions may have occurred due to the inherent limitations of voice recognition software.  Time Spent: 38 minutes of total time was spent on the date of the encounter performing the following actions: chart review prior to seeing the patient, obtaining history, performing a medically necessary exam, counseling on the treatment plan, placing orders, and documenting in our EHR.    Aiyannah Fayad M Yuna Pizzolato, PA-C

## 2020-10-11 ENCOUNTER — Telehealth: Payer: Self-pay

## 2020-10-11 NOTE — Telephone Encounter (Signed)
LAST APPOINTMENT DATE:  10/06/20  NEXT APPOINTMENT DATE: 02/05/21  MEDICATION:GLIPIZIDE XL 10 MG 24 hr tablet  PHARMACY:[id number] [address], [place]

## 2020-10-12 ENCOUNTER — Other Ambulatory Visit: Payer: Self-pay

## 2020-10-12 MED ORDER — GLIPIZIDE XL 10 MG PO TB24: 20.0000 mg | ORAL_TABLET | Freq: Every day | ORAL | 3 refills | Status: DC

## 2020-10-12 NOTE — Telephone Encounter (Signed)
Refill sent to pharmacy.   

## 2020-10-20 ENCOUNTER — Other Ambulatory Visit: Payer: Self-pay

## 2020-10-20 ENCOUNTER — Telehealth: Payer: Self-pay

## 2020-10-20 MED ORDER — TRULICITY 1.5 MG/0.5ML ~~LOC~~ SOAJ: 1.5000 mg | SUBCUTANEOUS | 1 refills | Status: DC

## 2020-10-20 MED ORDER — METFORMIN HCL 500 MG PO TABS: 500.0000 mg | ORAL_TABLET | Freq: Two times a day (BID) | ORAL | 1 refills | Status: DC

## 2020-10-20 MED ORDER — JARDIANCE 25 MG PO TABS: 25.0000 mg | ORAL_TABLET | Freq: Every morning | ORAL | 1 refills | Status: DC

## 2020-10-20 NOTE — Telephone Encounter (Signed)
Rx sent in

## 2020-10-20 NOTE — Telephone Encounter (Signed)
LAST APPOINTMENT DATE:  10/06/20  NEXT APPOINTMENT DATE: 02/05/21  MEDICATION:JARDIANCE 25 MG TABS tablet  Dulaglutide (TRULICITY) 1.5 UM/3.5TI SOPN  metFORMIN (GLUCOPHAGE) 500 MG tablet  PHARMACY: [id number] [address], [place] - [place]

## 2020-10-27 ENCOUNTER — Other Ambulatory Visit: Payer: Self-pay

## 2020-10-27 ENCOUNTER — Inpatient Hospital Stay: Payer: Medicare Other | Attending: Adult Health

## 2020-10-27 ENCOUNTER — Inpatient Hospital Stay: Payer: Medicare Other

## 2020-10-27 VITALS — BP 102/66 | HR 78 | Temp 98.1°F | Resp 20 | Ht 64.0 in | Wt 160.8 lb

## 2020-10-27 DIAGNOSIS — Z79899 Other long term (current) drug therapy: Secondary | ICD-10-CM | POA: Diagnosis not present

## 2020-10-27 DIAGNOSIS — Z818 Family history of other mental and behavioral disorders: Secondary | ICD-10-CM | POA: Insufficient documentation

## 2020-10-27 DIAGNOSIS — Z803 Family history of malignant neoplasm of breast: Secondary | ICD-10-CM | POA: Insufficient documentation

## 2020-10-27 DIAGNOSIS — Z5112 Encounter for antineoplastic immunotherapy: Secondary | ICD-10-CM | POA: Diagnosis present

## 2020-10-27 DIAGNOSIS — Z170 Estrogen receptor positive status [ER+]: Secondary | ICD-10-CM

## 2020-10-27 DIAGNOSIS — C50412 Malignant neoplasm of upper-outer quadrant of left female breast: Secondary | ICD-10-CM

## 2020-10-27 DIAGNOSIS — Z833 Family history of diabetes mellitus: Secondary | ICD-10-CM | POA: Diagnosis not present

## 2020-10-27 DIAGNOSIS — R5383 Other fatigue: Secondary | ICD-10-CM | POA: Diagnosis not present

## 2020-10-27 DIAGNOSIS — F32A Depression, unspecified: Secondary | ICD-10-CM | POA: Insufficient documentation

## 2020-10-27 DIAGNOSIS — Z8249 Family history of ischemic heart disease and other diseases of the circulatory system: Secondary | ICD-10-CM | POA: Insufficient documentation

## 2020-10-27 DIAGNOSIS — R0602 Shortness of breath: Secondary | ICD-10-CM | POA: Insufficient documentation

## 2020-10-27 DIAGNOSIS — Z808 Family history of malignant neoplasm of other organs or systems: Secondary | ICD-10-CM | POA: Diagnosis not present

## 2020-10-27 DIAGNOSIS — Z95828 Presence of other vascular implants and grafts: Secondary | ICD-10-CM

## 2020-10-27 LAB — CMP (CANCER CENTER ONLY)
ALT: 47 U/L — ABNORMAL HIGH (ref 0–44)
AST: 41 U/L (ref 15–41)
Albumin: 4.3 g/dL (ref 3.5–5.0)
Alkaline Phosphatase: 78 U/L (ref 38–126)
Anion gap: 10 (ref 5–15)
BUN: 17 mg/dL (ref 8–23)
CO2: 28 mmol/L (ref 22–32)
Calcium: 10 mg/dL (ref 8.9–10.3)
Chloride: 99 mmol/L (ref 98–111)
Creatinine: 0.61 mg/dL (ref 0.44–1.00)
GFR, Estimated: >60 mL/min (ref >60)
Glucose, Bld: 181 mg/dL — ABNORMAL HIGH (ref 70–99)
Potassium: 3.9 mmol/L (ref 3.5–5.1)
Sodium: 137 mmol/L (ref 135–145)
Total Bilirubin: 0.4 mg/dL (ref 0.3–1.2)
Total Protein: 7.9 g/dL (ref 6.5–8.1)

## 2020-10-27 LAB — CBC WITH DIFFERENTIAL (CANCER CENTER ONLY)
Abs Immature Granulocytes: 0.03 10*3/uL (ref 0.00–0.07)
Basophils Absolute: 0 10*3/uL (ref 0.0–0.1)
Basophils Relative: 1 %
Eosinophils Absolute: 0.1 10*3/uL (ref 0.0–0.5)
Eosinophils Relative: 1 %
HCT: 44.8 % (ref 36.0–46.0)
Hemoglobin: 14.4 g/dL (ref 12.0–15.0)
Immature Granulocytes: 0 %
Lymphocytes Relative: 60 %
Lymphs Abs: 5.2 10*3/uL — ABNORMAL HIGH (ref 0.7–4.0)
MCH: 30.4 pg (ref 26.0–34.0)
MCHC: 32.1 g/dL (ref 30.0–36.0)
MCV: 94.5 fL (ref 80.0–100.0)
Monocytes Absolute: 0.7 10*3/uL (ref 0.1–1.0)
Monocytes Relative: 8 %
Neutro Abs: 2.7 10*3/uL (ref 1.7–7.7)
Neutrophils Relative %: 30 %
Platelet Count: 185 10*3/uL (ref 150–400)
RBC: 4.74 MIL/uL (ref 3.87–5.11)
RDW: 14.9 % (ref 11.5–15.5)
WBC Count: 8.8 10*3/uL (ref 4.0–10.5)
nRBC: 0 % (ref 0.0–0.2)

## 2020-10-27 MED ORDER — HEPARIN SOD (PORK) LOCK FLUSH 100 UNIT/ML IV SOLN
500.0000 [IU] | Freq: Once | INTRAVENOUS | Status: AC | PRN
  Administered 2020-10-27: 500 [IU]

## 2020-10-27 MED ORDER — SODIUM CHLORIDE 0.9% FLUSH
10.0000 mL | Freq: Once | INTRAVENOUS | Status: AC
  Administered 2020-10-27: 10 mL

## 2020-10-27 MED ORDER — SODIUM CHLORIDE 0.9 % IV SOLN: Freq: Once | INTRAVENOUS | Status: AC

## 2020-10-27 MED ORDER — DIPHENHYDRAMINE HCL 25 MG PO CAPS
25.0000 mg | ORAL_CAPSULE | Freq: Once | ORAL | Status: AC
  Administered 2020-10-27: 25 mg via ORAL
  Filled 2020-10-27: qty 1

## 2020-10-27 MED ORDER — ACETAMINOPHEN 325 MG PO TABS
650.0000 mg | ORAL_TABLET | Freq: Once | ORAL | Status: AC
  Administered 2020-10-27: 650 mg via ORAL
  Filled 2020-10-27: qty 2

## 2020-10-27 MED ORDER — LORAZEPAM 2 MG/ML IJ SOLN
0.5000 mg | Freq: Once | INTRAMUSCULAR | Status: AC
  Administered 2020-10-27: 0.5 mg via INTRAVENOUS
  Filled 2020-10-27: qty 1

## 2020-10-27 MED ORDER — SODIUM CHLORIDE 0.9 % IV SOLN
3.6000 mg/kg | Freq: Once | INTRAVENOUS | Status: AC
  Administered 2020-10-27: 260 mg via INTRAVENOUS
  Filled 2020-10-27: qty 8

## 2020-10-27 MED ORDER — SODIUM CHLORIDE 0.9% FLUSH
10.0000 mL | INTRAVENOUS | Status: DC | PRN
  Administered 2020-10-27: 10 mL

## 2020-10-27 NOTE — Progress Notes (Signed)
52mins post observation performed.  Pt remained WNL and d/c stable.

## 2020-10-27 NOTE — Patient Instructions (Signed)
[facility]  Discharge Instructions: Thank you for choosing [facility] to provide your oncology and hematology care.   If you have a lab appointment with the Cancer Center, please go directly to the Cancer Center and check in at the registration area.   Wear comfortable clothing and clothing appropriate for easy access to any Portacath or PICC line.   We strive to give you quality time with your provider. You may need to reschedule your appointment if you arrive late (15 or more minutes).  Arriving late affects you and other patients whose appointments are after yours.  Also, if you miss three or more appointments without notifying the office, you may be dismissed from the clinic at the provider's discretion.      For prescription refill requests, have your pharmacy contact our office and allow 72 hours for refills to be completed.    Today you received the following chemotherapy and/or immunotherapy agents Ado-Trastuzumab Emtansine.      To help prevent nausea and vomiting after your treatment, we encourage you to take your nausea medication as directed.  BELOW ARE SYMPTOMS THAT SHOULD BE REPORTED IMMEDIATELY: *FEVER GREATER THAN 100.4 F (38 C) OR HIGHER *CHILLS OR SWEATING *NAUSEA AND VOMITING THAT IS NOT CONTROLLED WITH YOUR NAUSEA MEDICATION *UNUSUAL SHORTNESS OF BREATH *UNUSUAL BRUISING OR BLEEDING *URINARY PROBLEMS (pain or burning when urinating, or frequent urination) *BOWEL PROBLEMS (unusual diarrhea, constipation, pain near the anus) TENDERNESS IN MOUTH AND THROAT WITH OR WITHOUT PRESENCE OF ULCERS (sore throat, sores in mouth, or a toothache) UNUSUAL RASH, SWELLING OR PAIN  UNUSUAL VAGINAL DISCHARGE OR ITCHING   Items with * indicate a potential emergency and should be followed up as soon as possible or go to the Emergency Department if any problems should occur.  Please show the CHEMOTHERAPY ALERT CARD or IMMUNOTHERAPY ALERT CARD  at check-in to the Emergency Department and triage nurse.  Should you have questions after your visit or need to cancel or reschedule your appointment, please contact [facility]  Dept: [phone]  and follow the prompts.  Office hours are 8:00 a.m. to 4:30 p.m. Monday - Friday. Please note that voicemails left after 4:00 p.m. may not be returned until the following business day.  We are closed weekends and major holidays. You have access to a nurse at all times for urgent questions. Please call the main number to the clinic Dept: [phone] and follow the prompts.   For any non-urgent questions, you may also contact your provider using MyChart. We now offer e-Visits for anyone 18 and older to request care online for non-urgent symptoms. For details visit [email or web address].   Also download the MyChart app! Go to the app store, search "MyChart", open the app, select [facility], and log in with your MyChart username and password.  Due to Covid, a mask is required upon entering the hospital/clinic. If you do not have a mask, one will be given to you upon arrival. For doctor visits, patients may have 1 support person aged 18 or older with them. For treatment visits, patients cannot have anyone with them due to current Covid guidelines and our immunocompromised population.   

## 2020-11-05 ENCOUNTER — Other Ambulatory Visit: Payer: Self-pay | Admitting: Physician Assistant

## 2020-11-05 DIAGNOSIS — R079 Chest pain, unspecified: Secondary | ICD-10-CM

## 2020-11-15 ENCOUNTER — Other Ambulatory Visit: Payer: Self-pay

## 2020-11-15 DIAGNOSIS — C50412 Malignant neoplasm of upper-outer quadrant of left female breast: Secondary | ICD-10-CM

## 2020-11-16 ENCOUNTER — Inpatient Hospital Stay: Payer: Medicare Other | Attending: Oncology

## 2020-11-16 ENCOUNTER — Encounter: Payer: Self-pay | Admitting: *Deleted

## 2020-11-16 ENCOUNTER — Inpatient Hospital Stay: Payer: Medicare Other | Admitting: Oncology

## 2020-11-16 ENCOUNTER — Other Ambulatory Visit: Payer: Self-pay

## 2020-11-16 ENCOUNTER — Inpatient Hospital Stay: Payer: Medicare Other

## 2020-11-16 VITALS — BP 134/67 | HR 77 | Temp 97.7°F | Resp 18 | Ht 64.0 in | Wt 165.4 lb

## 2020-11-16 DIAGNOSIS — Z803 Family history of malignant neoplasm of breast: Secondary | ICD-10-CM | POA: Insufficient documentation

## 2020-11-16 DIAGNOSIS — Z5112 Encounter for antineoplastic immunotherapy: Secondary | ICD-10-CM | POA: Insufficient documentation

## 2020-11-16 DIAGNOSIS — Z79811 Long term (current) use of aromatase inhibitors: Secondary | ICD-10-CM | POA: Diagnosis not present

## 2020-11-16 DIAGNOSIS — Z8249 Family history of ischemic heart disease and other diseases of the circulatory system: Secondary | ICD-10-CM | POA: Diagnosis not present

## 2020-11-16 DIAGNOSIS — Z808 Family history of malignant neoplasm of other organs or systems: Secondary | ICD-10-CM | POA: Insufficient documentation

## 2020-11-16 DIAGNOSIS — Z90722 Acquired absence of ovaries, bilateral: Secondary | ICD-10-CM | POA: Diagnosis not present

## 2020-11-16 DIAGNOSIS — Z833 Family history of diabetes mellitus: Secondary | ICD-10-CM | POA: Diagnosis not present

## 2020-11-16 DIAGNOSIS — E785 Hyperlipidemia, unspecified: Secondary | ICD-10-CM | POA: Insufficient documentation

## 2020-11-16 DIAGNOSIS — C50412 Malignant neoplasm of upper-outer quadrant of left female breast: Secondary | ICD-10-CM

## 2020-11-16 DIAGNOSIS — Z170 Estrogen receptor positive status [ER+]: Secondary | ICD-10-CM

## 2020-11-16 DIAGNOSIS — Z818 Family history of other mental and behavioral disorders: Secondary | ICD-10-CM | POA: Diagnosis not present

## 2020-11-16 DIAGNOSIS — E039 Hypothyroidism, unspecified: Secondary | ICD-10-CM | POA: Insufficient documentation

## 2020-11-16 DIAGNOSIS — E1042 Type 1 diabetes mellitus with diabetic polyneuropathy: Secondary | ICD-10-CM | POA: Insufficient documentation

## 2020-11-16 DIAGNOSIS — Z794 Long term (current) use of insulin: Secondary | ICD-10-CM | POA: Diagnosis not present

## 2020-11-16 DIAGNOSIS — Z800 Family history of malignant neoplasm of digestive organs: Secondary | ICD-10-CM | POA: Diagnosis not present

## 2020-11-16 DIAGNOSIS — Z923 Personal history of irradiation: Secondary | ICD-10-CM | POA: Insufficient documentation

## 2020-11-16 DIAGNOSIS — Z95828 Presence of other vascular implants and grafts: Secondary | ICD-10-CM

## 2020-11-16 DIAGNOSIS — I2510 Atherosclerotic heart disease of native coronary artery without angina pectoris: Secondary | ICD-10-CM | POA: Diagnosis not present

## 2020-11-16 LAB — CBC WITH DIFFERENTIAL (CANCER CENTER ONLY)
Abs Immature Granulocytes: 0.02 10*3/uL (ref 0.00–0.07)
Basophils Absolute: 0 10*3/uL (ref 0.0–0.1)
Basophils Relative: 0 %
Eosinophils Absolute: 0.1 10*3/uL (ref 0.0–0.5)
Eosinophils Relative: 1 %
HCT: 41 % (ref 36.0–46.0)
Hemoglobin: 13.1 g/dL (ref 12.0–15.0)
Immature Granulocytes: 0 %
Lymphocytes Relative: 42 %
Lymphs Abs: 3.2 10*3/uL (ref 0.7–4.0)
MCH: 31.1 pg (ref 26.0–34.0)
MCHC: 32 g/dL (ref 30.0–36.0)
MCV: 97.4 fL (ref 80.0–100.0)
Monocytes Absolute: 0.7 10*3/uL (ref 0.1–1.0)
Monocytes Relative: 9 %
Neutro Abs: 3.7 10*3/uL (ref 1.7–7.7)
Neutrophils Relative %: 48 %
Platelet Count: 149 10*3/uL — ABNORMAL LOW (ref 150–400)
RBC: 4.21 MIL/uL (ref 3.87–5.11)
RDW: 15.3 % (ref 11.5–15.5)
WBC Count: 7.6 10*3/uL (ref 4.0–10.5)
nRBC: 0 % (ref 0.0–0.2)

## 2020-11-16 LAB — CMP (CANCER CENTER ONLY)
ALT: 38 U/L (ref 0–44)
AST: 29 U/L (ref 15–41)
Albumin: 3.7 g/dL (ref 3.5–5.0)
Alkaline Phosphatase: 78 U/L (ref 38–126)
Anion gap: 12 (ref 5–15)
BUN: 14 mg/dL (ref 8–23)
CO2: 24 mmol/L (ref 22–32)
Calcium: 10.2 mg/dL (ref 8.9–10.3)
Chloride: 104 mmol/L (ref 98–111)
Creatinine: 0.74 mg/dL (ref 0.44–1.00)
GFR, Estimated: >60 mL/min (ref >60)
Glucose, Bld: 337 mg/dL — ABNORMAL HIGH (ref 70–99)
Potassium: 3.9 mmol/L (ref 3.5–5.1)
Sodium: 140 mmol/L (ref 135–145)
Total Bilirubin: 0.4 mg/dL (ref 0.3–1.2)
Total Protein: 7.2 g/dL (ref 6.5–8.1)

## 2020-11-16 MED ORDER — SODIUM CHLORIDE 0.9% FLUSH
10.0000 mL | Freq: Once | INTRAVENOUS | Status: AC
  Administered 2020-11-16: 10 mL

## 2020-11-16 MED ORDER — ACETAMINOPHEN 325 MG PO TABS
650.0000 mg | ORAL_TABLET | Freq: Once | ORAL | Status: AC
  Administered 2020-11-16: 650 mg via ORAL
  Filled 2020-11-16: qty 2

## 2020-11-16 MED ORDER — ANASTROZOLE 1 MG PO TABS: 1.0000 mg | ORAL_TABLET | Freq: Every day | ORAL | 4 refills | Status: DC

## 2020-11-16 MED ORDER — SODIUM CHLORIDE 0.9 % IV SOLN
3.6000 mg/kg | Freq: Once | INTRAVENOUS | Status: AC
  Administered 2020-11-16: 260 mg via INTRAVENOUS
  Filled 2020-11-16: qty 5

## 2020-11-16 MED ORDER — LORAZEPAM 2 MG/ML IJ SOLN
0.5000 mg | Freq: Once | INTRAMUSCULAR | Status: AC
  Administered 2020-11-16: 0.5 mg via INTRAVENOUS
  Filled 2020-11-16: qty 1

## 2020-11-16 MED ORDER — SODIUM CHLORIDE 0.9 % IV SOLN: Freq: Once | INTRAVENOUS | Status: AC

## 2020-11-16 MED ORDER — HEPARIN SOD (PORK) LOCK FLUSH 100 UNIT/ML IV SOLN
500.0000 [IU] | Freq: Once | INTRAVENOUS | Status: AC | PRN
  Administered 2020-11-16: 500 [IU]

## 2020-11-16 MED ORDER — SODIUM CHLORIDE 0.9% FLUSH
10.0000 mL | INTRAVENOUS | Status: DC | PRN
  Administered 2020-11-16: 10 mL

## 2020-11-16 MED ORDER — DIPHENHYDRAMINE HCL 25 MG PO CAPS
25.0000 mg | ORAL_CAPSULE | Freq: Once | ORAL | Status: AC
  Administered 2020-11-16: 25 mg via ORAL
  Filled 2020-11-16: qty 1

## 2020-11-16 NOTE — Progress Notes (Unsigned)
Per MD ok to treat today with last echo. Orders entered for echo.

## 2020-11-16 NOTE — Patient Instructions (Signed)
[facility]  Discharge Instructions: °Thank you for choosing [facility] to provide your oncology and hematology care.  ° °If you have a lab appointment with the Cancer Center, please go directly to the Cancer Center and check in at the registration area. °  °Wear comfortable clothing and clothing appropriate for easy access to any Portacath or PICC line.  ° °We strive to give you quality time with your provider. You may need to reschedule your appointment if you arrive late (15 or more minutes).  Arriving late affects you and other patients whose appointments are after yours.  Also, if you miss three or more appointments without notifying the office, you may be dismissed from the clinic at the provider’s discretion.    °  °For prescription refill requests, have your pharmacy contact our office and allow 72 hours for refills to be completed.   ° °Today you received the following chemotherapy and/or immunotherapy agents [place]    °  °To help prevent nausea and vomiting after your treatment, we encourage you to take your nausea medication as directed. ° °BELOW ARE SYMPTOMS THAT SHOULD BE REPORTED IMMEDIATELY: °*FEVER GREATER THAN 100.4 F (38 °C) OR HIGHER °*CHILLS OR SWEATING °*NAUSEA AND VOMITING THAT IS NOT CONTROLLED WITH YOUR NAUSEA MEDICATION °*UNUSUAL SHORTNESS OF BREATH °*UNUSUAL BRUISING OR BLEEDING °*URINARY PROBLEMS (pain or burning when urinating, or frequent urination) °*BOWEL PROBLEMS (unusual diarrhea, constipation, pain near the anus) °TENDERNESS IN MOUTH AND THROAT WITH OR WITHOUT PRESENCE OF ULCERS (sore throat, sores in mouth, or a toothache) °UNUSUAL RASH, SWELLING OR PAIN  °UNUSUAL VAGINAL DISCHARGE OR ITCHING  ° °Items with * indicate a potential emergency and should be followed up as soon as possible or go to the Emergency Department if any problems should occur. ° °Please show the CHEMOTHERAPY ALERT CARD or IMMUNOTHERAPY ALERT CARD at check-in to the  Emergency Department and triage nurse. ° °Should you have questions after your visit or need to cancel or reschedule your appointment, please contact [facility]  Dept: [phone]  and follow the prompts.  Office hours are 8:00 a.m. to 4:30 p.m. Monday - Friday. Please note that voicemails left after 4:00 p.m. may not be returned until the following business day.  We are closed weekends and major holidays. You have access to a nurse at all times for urgent questions. Please call the main number to the clinic Dept: [phone] and follow the prompts. ° ° °For any non-urgent questions, you may also contact your provider using MyChart. We now offer e-Visits for anyone 18 and older to request care online for non-urgent symptoms. For details visit [email or web address]. °  °Also download the MyChart app! Go to the app store, search "MyChart", open the app, select [facility], and log in with your MyChart username and password. ° °Due to Covid, a mask is required upon entering the hospital/clinic. If you do not have a mask, one will be given to you upon arrival. For doctor visits, patients may have 1 support person aged 18 or older with them. For treatment visits, patients cannot have anyone with them due to current Covid guidelines and our immunocompromised population.  ° °

## 2020-11-16 NOTE — Progress Notes (Signed)
Per Wilber Bihari, routed recent labs to PCP.  Pt was in infusion; therefore, I shared with infusion nurse Phillips Climes) that pt glucose is elevated and to ask pt to ensure she is checking her blood sugars regularly and to follow up with her PCP if they continue to be elevated.  Pt verbalized understanding

## 2020-11-16 NOTE — Progress Notes (Signed)
Per MD ok to proceed with therapy as ordered; with echo dated 08/14/20.  She is scheduled for an echo.

## 2020-11-16 NOTE — Progress Notes (Signed)
Pt scheduled for echo 8/14 at 0800. Provided pt with number to call if she needs to r/s.

## 2020-11-16 NOTE — Progress Notes (Signed)
[facility]  [phone] [phone]     ID: Felicia [name] DOB: [birth year]  MR#: [id number]  NID#:[id number]  Patient Care Team: Allwardt, Randa Evens, PA-C as PCP - General (Physician Assistant) Nahser, [name], MD as PCP - Cardiology (Cardiology) Mauro Kaufmann, RN as Oncology Nurse Navigator Rockwell Germany, RN as Oncology Nurse Navigator Rolm Bookbinder, MD as Consulting Physician (General Surgery) Eppie Gibson, MD as Attending Physician (Radiation Oncology) [name], MD as Consulting Physician (Rheumatology) [name], MD as Consulting Physician (Orthopedic Surgery) Larey Dresser, MD as Consulting Physician (Cardiology) Madelin Rear, MD as Consulting Physician (Endocrinology) Anilah Huck, Virgie Dad, MD as Consulting Physician (Oncology) Chauncey Cruel, MD OTHER MD:  CHIEF COMPLAINT: Triple positive invasive breast cancer  CURRENT TREATMENT: Completing T-DM1; starting anastrozole   INTERVAL HISTORY: [name] returns today for follow up and treatment of her triple positive invasive breast cancer.    She is receiving TDM-1 every 3 weeks and tolerates it well.   Her most recent echocardiogram was completed on 08/14/2020 and showed an EF of 65-70%.  She is already scheduled for her next echo 11/27/2020   REVIEW OF SYSTEMS: [name] has significant pain in the right shoulder where she says she has a torn tendon.  She does pain in her left foot which is why she uses a cane.  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  She is hoping to be able to get back to work soon.  Meanwhile she has significant issues with finances and cannot afford her rent.  She has already been to court twice because she has not been able to make her rent for the last 2 months.   COVID 19 VACCINATION STATUS: Refuses vaccination   HISTORY OF CURRENT ILLNESS: From the original intake note:  Brittannie Tawney had routine  screening mammography on 08/03/2019 showing a possible abnormality in the left breast. She underwent left diagnostic mammography with tomography and left breast ultrasonography at [facility] on 08/17/2019 showing: breast density category B; 1.6 cm upper-outer left breast mass; no significant left axillary abnormalities.  Accordingly on 08/30/2019 she proceeded to biopsy of the left breast area in question. The pathology from this procedure ([id number]) showed: invasive mammary carcinoma, e-cadherin positive, grade 2. Prognostic indicators significant for: estrogen receptor, 100% positive and progesterone receptor, 40% positive, both with strong staining intensity. Proliferation marker Ki67 at 20%. HER2 equivocal by immunohistochemistry (2+), but negative by fluorescent in situ hybridization with a signals ratio   and number per cell  .  Of note, she also has a history of FIGO grade 3, stage IA endometrial carcinoma, diagnosed in 2014 in [place]. She underwent robotic assisted total hysterectomy with BSO and pelvic and perirectal aortic lymph node dissection as well as omental biopsy.  Tumor was found only in the endometrium arising in a polyp measuring 1.7 cm.  There was no myometrial invasion no lymphovascular space invasion, and no lower uterine segment involvement.  The cervix and adnexa, lymph nodes and omentum were all not involved.  She then received vaginal brachytherapy, no chemotherapy, completing treatment in the springtime of 2015. [Per patient's report, chemotherapy was suggested, but she opted against this.]  The patient's subsequent history is as detailed below.   PAST MEDICAL HISTORY: Past Medical History:  Diagnosis Date   Anemia    Anxiety    Breast cancer ([place]) 09/2019   left breast IMC   CAD (coronary artery disease)    a.  3/2007s/p DES to the LAD [facility]); b. 01/2017 MV: EF 80%, small, mild apical ant defect w/o ischemia (felt to be breast atten). Low risk.   Depression     DM (diabetes mellitus) ([place])    Essential hypertension    GERD (gastroesophageal reflux disease)    Headache    secondary to neck surgery per patient   High triglycerides    Hyperlipidemia    Hypothyroid    OA (osteoarthritis)    right knee,hands   Overflow incontinence    PONV (postoperative nausea and vomiting)    s/p gallbladder    RLS (restless legs syndrome)    Urinary urgency    Uterine cancer ([place]) dx'd 2014    PAST SURGICAL HISTORY: Past Surgical History:  Procedure Laterality Date   BREAST LUMPECTOMY WITH RADIOACTIVE SEED AND SENTINEL LYMPH NODE BIOPSY Left 10/07/2019   Procedure: LEFT BREAST LUMPECTOMY WITH RADIOACTIVE SEED AND SENTINEL LYMPH NODE BIOPSY;  Surgeon: Rolm Bookbinder, MD;  Location: [facility];  Service: General;  Laterality: Left;   CORONARY STENT PLACEMENT     CORONARY/GRAFT ACUTE MI REVASCULARIZATION     FINGER ARTHRODESIS Right 06/11/2019   Procedure: ARTHRODESIS INDEX FINGER DISTAL PHALANGEAL JOINT;  Surgeon: [name], MD;  Location: [facility];  Service: Orthopedics;  Laterality: Right;   FOOT SURGERY Left    LEFT HEART CATH AND CORONARY ANGIOGRAPHY N/A 02/16/2018   Procedure: LEFT HEART CATH AND CORONARY ANGIOGRAPHY;  Surgeon: Lorretta Harp, MD;  Location: [facility];  Service: Cardiovascular;  Laterality: N/A;   LEFT HEART CATH AND CORONARY ANGIOGRAPHY N/A 05/01/2020   Procedure: LEFT HEART CATH AND CORONARY ANGIOGRAPHY;  Surgeon: Lorretta Harp, MD;  Location: [facility];  Service: Cardiovascular;  Laterality: N/A;   PORTACATH PLACEMENT Right 11/23/2019   Procedure: INSERTION PORT-A-CATH WITH ULTRASOUND GUIDANCE;  Surgeon: Rolm Bookbinder, MD;  Location: [facility];  Service: General;  Laterality: Right;   REPLACEMENT TOTAL KNEE Right    SPINE SURGERY     TOTAL ABDOMINAL HYSTERECTOMY     FOR UTERUS CANCER   TOTAL HIP ARTHROPLASTY Left     FAMILY HISTORY: Family History   Problem Relation Age of Onset   AAA (abdominal aortic aneurysm) Mother    Diabetes Father    Alzheimer's disease Father    Throat cancer Sister    Breast cancer Cousin    Her father died at age 54 from Alzheimer's. Her mother died at age 57 from a ruptured abdominal aneurysm. Emmily has 2 brothers and 3 sisters. She reports breast cancer in a paternal cousin. She also notes throat cancer in her sister.   GYNECOLOGIC HISTORY:  No LMP recorded. Patient has had a hysterectomy. Menarche: 70 years old Age at first live birth: 70 years old [place] P 2 LMP 1996 Contraceptive: prior use without issue HRT never used  Hysterectomy? Yes, 2014 BSO? 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  She unpacks 40 pounds boxes of chicken, has to cook them in 13 pound cooking pans, and is generally on her feet at work but does not otherwise exercise.  She is divorced. She lives at home by herself, with no pets. Daughter Pershing Proud, age 48, is a radiology tech in [place]. Daughter Martyn Malay, age 93, is an Optometrist in [place], [place]. Nidia has one grandchild. She is not a Ambulance person.    ADVANCED DIRECTIVES: Not in  place; intends to name daughter [name] as her HCPOA.   HEALTH MAINTENANCE: Social History   Tobacco Use   Smoking status: Never   Smokeless tobacco: Never  Vaping Use   Vaping Use: Never used  Substance Use Topics   Alcohol use: Yes    Comment: OCCASIONALLY   Drug use: No     Colonoscopy: [place]10 years ago, [place]  PAP: 2014?  Bone density: date unsure, also in [place]   Allergies  Allergen Reactions   Chloraprep One Step [Chlorhexidine Gluconate] Rash   Estrogens Other (See Comments)    PATIENT HAS A HISTORY OF CANCER AND HAS BEEN TOLD TO NEVER TAKE ANYTHING CONTAINING ESTROGEN, AS IT MIGHT CAUSE A RECURRENCE   Shrimp [Shellfish Allergy] Nausea And Vomiting    Current Outpatient Medications  Medication Sig Dispense  Refill   ACCU-CHEK GUIDE test strip 1 each by Other route as needed.     albuterol (VENTOLIN HFA) 108 (90 Base) MCG/ACT inhaler Inhale 2 puffs into the lungs every 6 (six) hours as needed for wheezing or shortness of breath. 8 g 2   Ascorbic Acid (VITAMIN C PO) Take 1 tablet by mouth daily.     aspirin EC 81 MG tablet Take 81 mg by mouth daily.     atorvastatin (LIPITOR) 40 MG tablet TAKE 1 TABLET BY MOUTH EVERY DAY 90 tablet 2   bisoprolol (ZEBETA) 5 MG tablet TAKE 1 TABLET BY MOUTH EVERY DAY 90 tablet 2   Calcium Citrate-Vitamin D (CALCIUM + D PO) Take 1 tablet by mouth daily with supper.     CINNAMON PO Take 1 capsule by mouth daily with supper.     diclofenac sodium (VOLTAREN) 1 % GEL Apply 2-4 g topically 4 (four) times daily as needed (as directed for pain).     Dulaglutide (TRULICITY) 1.5 PJ/8.2NK SOPN Inject 1.5 mg into the skin once a week. 2 mL 1   escitalopram (LEXAPRO) 10 MG tablet Take 10 mg by mouth at bedtime.     Ferrous Sulfate (IRON PO) Take 1 tablet by mouth daily. alternates days:1 tablet one day and 2 tablets the next day     gabapentin (NEURONTIN) 300 MG capsule Take 1 capsule (300 mg total) by mouth at bedtime. 90 capsule 4   GLIPIZIDE XL 10 MG 24 hr tablet Take 2 tablets (20 mg total) by mouth daily. 90 tablet 3   JARDIANCE 25 MG TABS tablet Take 1 tablet (25 mg total) by mouth every morning. 90 tablet 1   levothyroxine (SYNTHROID) 137 MCG tablet Take 137 mcg by mouth every morning.     metFORMIN (GLUCOPHAGE) 500 MG tablet Take 1 tablet (500 mg total) by mouth 2 (two) times daily with a meal. 180 tablet 1   metroNIDAZOLE (METROGEL) 1 % gel Apply 1 application topically as needed (as directed to affected area). 45 g 2   Multiple Vitamins-Calcium (ONE-A-DAY WOMENS PO) Take 1 tablet by mouth daily.     nitroGLYCERIN (NITROSTAT) 0.4 MG SL tablet Place 1 tablet (0.4 mg total) under the tongue every 5 (five) minutes as needed for chest pain. 25 tablet 6   omeprazole (PRILOSEC)  40 MG capsule Take 40 mg by mouth daily with supper.      prochlorperazine (COMPAZINE) 10 MG tablet TAKE 1 TABLET BY MOUTH BEFORE MEALS AND AT BEDTIME ON DAYS 2 AND 3 AFTER CHEMOTHERAPY (COUNTING CHEMOTHERAPY DAY AS DAY 1) AFTER THAT MAY TAKE AS NEEDED 30 tablet 1   rOPINIRole (REQUIP) 0.5  MG tablet 1 TABLET 1 TO 3 HOURS BEFORE BEDTIME ORALLY ONCE A DAY 90 DAYS 90 tablet 0   tiZANidine (ZANAFLEX) 4 MG tablet Take 4 mg by mouth every 8 (eight) hours as needed for muscle spasms. As needed     traMADol (ULTRAM) 50 MG tablet Take 1 tablet (50 mg total) by mouth every 6 (six) hours as needed for moderate pain. 30 tablet 0   TURMERIC PO Take 1 capsule by mouth daily with supper.     No current facility-administered medications for this visit.    OBJECTIVE: White woman who appears stated age 59:   11/16/20 1017  BP: 134/67  Pulse: 77  Resp: 18  Temp: 97.7 F (36.5 C)  SpO2: 98%     Body mass index is 28.39 kg/m.   Wt Readings from Last 3 Encounters:  11/16/20 165 lb 6.4 oz (75 kg)  10/27/20 160 lb 12.8 oz (72.9 kg)  10/06/20 161 lb 3.2 oz (73.1 kg)  ECOG FS:1 - Symptomatic but completely ambulatory  Sclerae unicteric, EOMs intact Wearing a mask No cervical or supraclavicular adenopathy Lungs no rales or rhonchi Heart regular rate and rhythm Abd soft, nontender, positive bowel sounds MSK no focal spinal tenderness, no upper extremity lymphedema Neuro: nonfocal, well oriented, appropriate affect Breasts: The right breast is benign per the left breast status postlumpectomy and radiation.  There is no evidence of local recurrence.  Both axillae are benign   LAB RESULTS:  CMP     Component Value Date/Time   NA 137 10/27/2020 1003   NA 139 11/25/2018 1053   K 3.9 10/27/2020 1003   CL 99 10/27/2020 1003   CO2 28 10/27/2020 1003   GLUCOSE 181 (H) 10/27/2020 1003   BUN 17 10/27/2020 1003   BUN 18 11/25/2018 1053   CREATININE 0.61 10/27/2020 1003   CALCIUM 10.0 10/27/2020 1003    PROT 7.9 10/27/2020 1003   PROT 7.1 11/25/2018 1053   ALBUMIN 4.3 10/27/2020 1003   ALBUMIN 4.2 11/25/2018 1053   AST 41 10/27/2020 1003   ALT 47 (H) 10/27/2020 1003   ALKPHOS 78 10/27/2020 1003   BILITOT 0.4 10/27/2020 1003   GFRNONAA >60 10/27/2020 1003   GFRAA >60 10/04/2019 1330   GFRAA >60 09/08/2019 0811    No results found for: TOTALPROTELP, ALBUMINELP, A1GS, A2GS, BETS, BETA2SER, GAMS, MSPIKE, SPEI  Lab Results  Component Value Date   WBC 7.6 11/16/2020   NEUTROABS 3.7 11/16/2020   HGB 13.1 11/16/2020   HCT 41.0 11/16/2020   MCV 97.4 11/16/2020   PLT 149 (L) 11/16/2020    No results found for: LABCA2  No components found for: QQPYPP509  No results for input(s): INR in the last 168 hours.  No results found for: LABCA2  No results found for: TOI712  No results found for: WPY099  No results found for: IPJ825  No results found for: CA2729  No components found for: HGQUANT  No results found for: CEA1 / No results found for: CEA1   No results found for: AFPTUMOR  No results found for: CHROMOGRNA  No results found for: KPAFRELGTCHN, LAMBDASER, KAPLAMBRATIO (kappa/lambda light chains)  No results found for: HGBA, HGBA2QUANT, HGBFQUANT, HGBSQUAN (Hemoglobinopathy evaluation)   No results found for: LDH  No results found for: IRON, TIBC, IRONPCTSAT (Iron and TIBC)  No results found for: FERRITIN  Urinalysis No results found for: COLORURINE, APPEARANCEUR, LABSPEC, PHURINE, GLUCOSEU, HGBUR, BILIRUBINUR, KETONESUR, PROTEINUR, UROBILINOGEN, NITRITE, LEUKOCYTESUR   STUDIES: No results found.  ELIGIBLE FOR AVAILABLE RESEARCH PROTOCOL: AET  ASSESSMENT: 70 y.o. [place] woman status post left breast upper outer quadrant biopsy 08/30/2019 for a clinical T1c N0, stage IA invasive ductal carcinoma, grade 2, estrogen and progesterone receptor positive, HER-2 amplified, with an MIB-1 of 20%.  (0) status post TAH/BSO 2014 for a FIGO grade 3, stage IA  endometrial carcinoma, status post vaginal brachytherapy, no adjuvant chemotherapy  (1) status post left lumpectomy and axillary sentinel node sampling 10/07/2019 for a pT1c pN1, stage IB invasive ductal carcinoma, grade 2, with negative margins.  (a) a total of 1 sentinel lymph node was removed.  (2) adjuvant chemotherapy consisting of carboplatin, gemcitabine, trastuzumab given every 3 weeks x 6 started 11/24/2019, last dose 01/26/2020  (a) baseline echo 09/15/2019 shows an ejection fraction in the 60-65% range.  (b) will not use docetaxel secondary to severe peripheral neuropathy present at baseline  (c) echo on 01/17/2020 shows EF of 60-65%  (d) chemotherapy discontinued after 4 cycles with poor tolerance.  (3) T-DM1 started 02/16/2020, to be continued through November 2022  (a) echo 5/24 EF 60-65%  (B) echo on 08/14/2020 showed an ejection fraction in the 65-70% range  (4) adjuvant radiation completed in 03/30/2020-05/12/2020 Site Technique Total Dose (Gy) Dose per Fx (Gy) Completed Fx Beam Energies  Breast, Left: Breast_Lt 3D 50/50 2 25/25 10X  Breast, Left: Breast_Lt_SCV_PAB 3D 50/50 2 25/25 10X, 15X  Breast, Left: Breast_Lt_Bst 3D 10/10 2 5/5 6X, 10X    (5) antiestrogens to start at the completion of local treatment   PLAN: Adisen completes her anti-HER2 treatment today.  She has an echo scheduled for later this month and that will be her final echo.  She would like to keep her port at least for now.  That means we do need to flush her in 8 weeks so that has been entered.   She is now ready to start anastrozole.  We discussed the possible toxicities side effects and complications of this agent and I put in the prescription.  She will call if she has any unusual side effects otherwise we will review how she tolerates it when she returns to see [place] in early January.  She will then have mammography and a DEXA scan in June and see [place] again in July and all those appointments have been  entered  I commiserated with her problems finding an inexpensive place to rent.  Possibly if she shares a place with someone else they might be able to make it but right now the situation looks very difficult.  Total encounter time 25 minutes.Sarajane Jews C. Janan Bogie, MD 11/16/20 10:25 AM Medical Oncology and Hematology [facility] [place], [place] [address] Tel. [phone]    Fax. [phone]   I, Wilburn Mylar, am acting as scribe for Dr. Virgie Dad. Idus Rathke.  I, Lurline Del MD, have reviewed the above documentation for accuracy and completeness, and I agree with the above.    *Total Encounter Time as defined by the Centers for Medicare and Medicaid Services includes, in addition to the face-to-face time of a patient visit (documented in the note above) non-face-to-face time: obtaining and reviewing outside history, ordering and reviewing medications, tests or procedures, care coordination (communications with other health care professionals or caregivers) and documentation in the medical record.

## 2020-11-17 ENCOUNTER — Ambulatory Visit (INDEPENDENT_AMBULATORY_CARE_PROVIDER_SITE_OTHER): Payer: Medicare Other | Admitting: Physician Assistant

## 2020-11-17 ENCOUNTER — Encounter: Payer: Self-pay | Admitting: Physician Assistant

## 2020-11-17 VITALS — BP 118/82 | HR 69 | Temp 98.2°F | Ht 64.0 in | Wt 163.2 lb

## 2020-11-17 DIAGNOSIS — E119 Type 2 diabetes mellitus without complications: Secondary | ICD-10-CM | POA: Diagnosis not present

## 2020-11-17 NOTE — Patient Instructions (Signed)
Good to see you today! Let's increase Metformin to one tablet in the morning and two tablets in the evening.  If tolerating well, please increase to two tablets in the morning and two tablets of metformin in the evening after one month.  See you back as scheduled.

## 2020-11-17 NOTE — Progress Notes (Signed)
Subjective:    Patient ID: Felicia [name], female    DOB: 1950-10-01, 70 y.o.   MRN: [id number]  Chief Complaint  Patient presents with   Medication Management    HPI Patient with triple positive invasive breast cancer is in today for diabetes management. Pt states that she was in at the cancer center yesterday and the nurse was very concerned about her blood sugar. Her glucose level was 337 mg/dL. Pt states that she had pasta and caramel popcorn the night before. Last Ha1c was 8.3 on 10/06/20.   It has been almost two years since she was on Metformin 1000 mg twice daily, but she is willing to adjust if needed.   Pt is also stressing about her finances. She does get Food Stamps, but her rent is over $800 per month. She is trying to find a new place to live because she cannot afford her rent, healthcare, and medications.   Past Medical History:  Diagnosis Date   Anemia    Anxiety    Breast cancer ([place]) 09/2019   left breast IMC   CAD (coronary artery disease)    a. 3/2007s/p DES to the LAD [facility]); b. 01/2017 MV: EF 80%, small, mild apical ant defect w/o ischemia (felt to be breast atten). Low risk.   Depression    DM (diabetes mellitus) ([place])    Essential hypertension    GERD (gastroesophageal reflux disease)    Headache    secondary to neck surgery per patient   High triglycerides    Hyperlipidemia    Hypothyroid    OA (osteoarthritis)    right knee,hands   Overflow incontinence    PONV (postoperative nausea and vomiting)    s/p gallbladder    RLS (restless legs syndrome)    Urinary urgency    Uterine cancer ([place]) dx'd 2014    Past Surgical History:  Procedure Laterality Date   BREAST LUMPECTOMY WITH RADIOACTIVE SEED AND SENTINEL LYMPH NODE BIOPSY Left 10/07/2019   Procedure: LEFT BREAST LUMPECTOMY WITH RADIOACTIVE SEED AND SENTINEL LYMPH NODE BIOPSY;  Surgeon: Rolm Bookbinder, MD;  Location: [facility];  Service: General;  Laterality: Left;    CORONARY STENT PLACEMENT     CORONARY/GRAFT ACUTE MI REVASCULARIZATION     FINGER ARTHRODESIS Right 06/11/2019   Procedure: ARTHRODESIS INDEX FINGER DISTAL PHALANGEAL JOINT;  Surgeon: [name], MD;  Location: [facility];  Service: Orthopedics;  Laterality: Right;   FOOT SURGERY Left    LEFT HEART CATH AND CORONARY ANGIOGRAPHY N/A 02/16/2018   Procedure: LEFT HEART CATH AND CORONARY ANGIOGRAPHY;  Surgeon: Lorretta Harp, MD;  Location: [facility];  Service: Cardiovascular;  Laterality: N/A;   LEFT HEART CATH AND CORONARY ANGIOGRAPHY N/A 05/01/2020   Procedure: LEFT HEART CATH AND CORONARY ANGIOGRAPHY;  Surgeon: Lorretta Harp, MD;  Location: [facility];  Service: Cardiovascular;  Laterality: N/A;   PORTACATH PLACEMENT Right 11/23/2019   Procedure: INSERTION PORT-A-CATH WITH ULTRASOUND GUIDANCE;  Surgeon: Rolm Bookbinder, MD;  Location: [facility];  Service: General;  Laterality: Right;   REPLACEMENT TOTAL KNEE Right    SPINE SURGERY     TOTAL ABDOMINAL HYSTERECTOMY     FOR UTERUS CANCER   TOTAL HIP ARTHROPLASTY Left     Family History  Problem Relation Age of Onset   AAA (abdominal aortic aneurysm) Mother    Diabetes Father    Alzheimer's disease Father    Throat cancer Sister  Breast cancer Cousin     Social History   Tobacco Use   Smoking status: Never   Smokeless tobacco: Never  Vaping Use   Vaping Use: Never used  Substance Use Topics   Alcohol use: Yes    Comment: OCCASIONALLY   Drug use: No     Allergies  Allergen Reactions   Chloraprep One Step [Chlorhexidine Gluconate] Rash   Estrogens Other (See Comments)    PATIENT HAS A HISTORY OF CANCER AND HAS BEEN TOLD TO NEVER TAKE ANYTHING CONTAINING ESTROGEN, AS IT MIGHT CAUSE A RECURRENCE   Shrimp [Shellfish Allergy] Nausea And Vomiting    Review of Systems REFER TO HPI FOR PERTINENT POSITIVES AND NEGATIVES      Objective:     BP 118/82   Pulse 69   Temp  98.2 F (36.8 C)   Ht 5\' 4"  (1.626 m)   Wt 163 lb 3.2 oz (74 kg)   BMI 28.01 kg/m   Wt Readings from Last 3 Encounters:  11/17/20 163 lb 3.2 oz (74 kg)  11/16/20 165 lb 6.4 oz (75 kg)  10/27/20 160 lb 12.8 oz (72.9 kg)    BP Readings from Last 3 Encounters:  11/17/20 118/82  11/16/20 134/67  10/27/20 102/66     Physical Exam Vitals and nursing note reviewed.  Constitutional:      Appearance: Normal appearance. She is normal weight. She is not toxic-appearing.  HENT:     Head: Normocephalic and atraumatic.     Right Ear: External ear normal.     Left Ear: External ear normal.     Nose: Nose normal.     Mouth/Throat:     Mouth: Mucous membranes are moist.  Eyes:     Extraocular Movements: Extraocular movements intact.     Conjunctiva/sclera: Conjunctivae normal.     Pupils: Pupils are equal, round, and reactive to light.  Cardiovascular:     Rate and Rhythm: Normal rate and regular rhythm.     Pulses: Normal pulses.     Heart sounds: Normal heart sounds.  Pulmonary:     Effort: Pulmonary effort is normal.     Breath sounds: Normal breath sounds.  Musculoskeletal:        General: Normal range of motion.     Cervical back: Normal range of motion and neck supple.  Skin:    General: Skin is warm and dry.  Neurological:     General: No focal deficit present.     Mental Status: She is alert and oriented to person, place, and time.  Psychiatric:        Mood and Affect: Mood normal.        Behavior: Behavior normal.        Thought Content: Thought content normal.        Judgment: Judgment normal.       Assessment & Plan:   Problem List Items Addressed This Visit       Endocrine   Type 2 diabetes mellitus without complication, without long-term current use of insulin ([place]) - Primary    1. Type 2 diabetes mellitus without complication, without long-term current use of insulin ([facility]) -Uncontrolled -Trial increase Metformin to one tablet in the morning and two  tablets in the evening. If tolerating well, increase to two tablets in the morning and two tablets of metformin in the evening after one month. -She will recheck with me as scheduled.    I will also put in a referral to social  Insurance underwriter.   This note was prepared with assistance of Systems analyst. Occasional wrong-word or sound-a-like substitutions may have occurred due to the inherent limitations of voice recognition software.  Time Spent: 34 minutes of total time was spent on the date of the encounter performing the following actions: chart review prior to seeing the patient, obtaining history, performing a medically necessary exam, counseling on the treatment plan, placing orders, and documenting in our EHR.    [place] W[place] M Kendalyn [place]field, PA-C

## 2020-11-21 ENCOUNTER — Other Ambulatory Visit: Payer: Self-pay

## 2020-11-27 ENCOUNTER — Other Ambulatory Visit: Payer: Self-pay

## 2020-11-27 ENCOUNTER — Ambulatory Visit (HOSPITAL_COMMUNITY)
Admission: RE | Admit: 2020-11-27 | Discharge: 2020-11-27 | Disposition: A | Discharge status: Home / Self Care | Payer: Medicare Other | Source: Ambulatory Visit | Attending: Oncology | Admitting: Oncology

## 2020-11-27 DIAGNOSIS — C50412 Malignant neoplasm of upper-outer quadrant of left female breast: Secondary | ICD-10-CM | POA: Insufficient documentation

## 2020-11-27 DIAGNOSIS — I358 Other nonrheumatic aortic valve disorders: Secondary | ICD-10-CM | POA: Diagnosis not present

## 2020-11-27 DIAGNOSIS — I10 Essential (primary) hypertension: Secondary | ICD-10-CM | POA: Insufficient documentation

## 2020-11-27 DIAGNOSIS — E119 Type 2 diabetes mellitus without complications: Secondary | ICD-10-CM | POA: Diagnosis not present

## 2020-11-27 DIAGNOSIS — Z0189 Encounter for other specified special examinations: Secondary | ICD-10-CM

## 2020-11-27 DIAGNOSIS — Z01818 Encounter for other preprocedural examination: Secondary | ICD-10-CM | POA: Insufficient documentation

## 2020-11-27 DIAGNOSIS — Z170 Estrogen receptor positive status [ER+]: Secondary | ICD-10-CM | POA: Diagnosis not present

## 2020-11-27 LAB — ECHOCARDIOGRAM COMPLETE
Area-P 1/2: 3.24 cm2
Calc EF: 63.1 %
P 1/2 time: 844 msec
S' Lateral: 2.7 cm
Single Plane A2C EF: 60.4 %
Single Plane A4C EF: 69 %

## 2020-12-03 ENCOUNTER — Other Ambulatory Visit: Payer: Self-pay | Admitting: Physician Assistant

## 2020-12-06 ENCOUNTER — Other Ambulatory Visit: Payer: Self-pay

## 2020-12-06 ENCOUNTER — Telehealth: Payer: Self-pay

## 2020-12-06 MED ORDER — METFORMIN HCL 500 MG PO TABS: 500.0000 mg | ORAL_TABLET | Freq: Two times a day (BID) | ORAL | 1 refills | Status: DC

## 2020-12-06 NOTE — Telephone Encounter (Signed)
LAST APPOINTMENT DATE: 11/17/20  NEXT APPOINTMENT DATE: 02/05/21  MEDICATION:escitalopram (LEXAPRO) 10 MG tablet  metFORMIN (GLUCOPHAGE) 500 MG tablet  PHARMACY: [id number] - [place], [place] - [place]

## 2020-12-08 ENCOUNTER — Other Ambulatory Visit: Payer: Self-pay | Admitting: Physician Assistant

## 2020-12-08 MED ORDER — ESCITALOPRAM OXALATE 10 MG PO TABS: 10.0000 mg | ORAL_TABLET | Freq: Every day | ORAL | 1 refills | Status: DC

## 2020-12-08 NOTE — Telephone Encounter (Signed)
Rx Lexapro sent

## 2020-12-20 ENCOUNTER — Other Ambulatory Visit: Payer: Self-pay | Admitting: Physician Assistant

## 2020-12-22 ENCOUNTER — Telehealth: Payer: Self-pay | Admitting: General Practice

## 2020-12-22 NOTE — Telephone Encounter (Signed)
[facility] CSW Progress Notes  Call from patient, she has gotten eviction notice, states she will be padlocked out Dec 17.  She is behind in rent.  She has identified a new, more affordable place, but it is not available until February.  She has worked to obtain any source of financial assistance w [place], she has also received the [employer] from [place].  Referred to [facility] for eviction mediation.  Will ask CSW [place] to contact her upon her return from leave next week.  Felicia Shell, LCSW Clinical Social Worker Phone:  [phone]

## 2020-12-25 ENCOUNTER — Ambulatory Visit: Payer: Medicare Other | Attending: General Surgery

## 2020-12-25 ENCOUNTER — Other Ambulatory Visit: Payer: Self-pay

## 2020-12-25 VITALS — Wt 164.0 lb

## 2020-12-25 DIAGNOSIS — Z483 Aftercare following surgery for neoplasm: Secondary | ICD-10-CM | POA: Insufficient documentation

## 2020-12-25 NOTE — Therapy (Signed)
[facility] @ [address] [place], [place], [address] Phone: [phone]   Fax:  [phone]  Physical Therapy Treatment  Patient Details  Name: Felicia [name] MRN: [id number] Date of Birth: 05-09-1950 Referring Provider (PT): Dr. Jana Hakim   Encounter Date: 12/25/2020   PT End of Session - 12/25/20 1607     Visit Number 3   # unchanged due to screen only   PT Start Time 1603    PT Stop Time 1609    PT Time Calculation (min) 6 min    Activity Tolerance Patient tolerated treatment well    Behavior During Therapy [facility] for tasks assessed/performed             Past Medical History:  Diagnosis Date   Anemia    Anxiety    Breast cancer ([place]) 09/2019   left breast IMC   CAD (coronary artery disease)    a. 3/2007s/p DES to the LAD [facility]); b. 01/2017 MV: EF 80%, small, mild apical ant defect w/o ischemia (felt to be breast atten). Low risk.   Depression    DM (diabetes mellitus) ([place])    Essential hypertension    GERD (gastroesophageal reflux disease)    Headache    secondary to neck surgery per patient   High triglycerides    Hyperlipidemia    Hypothyroid    OA (osteoarthritis)    right knee,hands   Overflow incontinence    PONV (postoperative nausea and vomiting)    s/p gallbladder    RLS (restless legs syndrome)    Urinary urgency    Uterine cancer ([place]) dx'd 2014    Past Surgical History:  Procedure Laterality Date   BREAST LUMPECTOMY WITH RADIOACTIVE SEED AND SENTINEL LYMPH NODE BIOPSY Left 10/07/2019   Procedure: LEFT BREAST LUMPECTOMY WITH RADIOACTIVE SEED AND SENTINEL LYMPH NODE BIOPSY;  Surgeon: Rolm Bookbinder, MD;  Location: [facility];  Service: General;  Laterality: Left;   CORONARY STENT PLACEMENT     CORONARY/GRAFT ACUTE MI REVASCULARIZATION     FINGER ARTHRODESIS Right 06/11/2019   Procedure: ARTHRODESIS INDEX FINGER DISTAL PHALANGEAL JOINT;  Surgeon: [name], MD;   Location: [facility];  Service: Orthopedics;  Laterality: Right;   FOOT SURGERY Left    LEFT HEART CATH AND CORONARY ANGIOGRAPHY N/A 02/16/2018   Procedure: LEFT HEART CATH AND CORONARY ANGIOGRAPHY;  Surgeon: Lorretta Harp, MD;  Location: [facility];  Service: Cardiovascular;  Laterality: N/A;   LEFT HEART CATH AND CORONARY ANGIOGRAPHY N/A 05/01/2020   Procedure: LEFT HEART CATH AND CORONARY ANGIOGRAPHY;  Surgeon: Lorretta Harp, MD;  Location: [facility];  Service: Cardiovascular;  Laterality: N/A;   PORTACATH PLACEMENT Right 11/23/2019   Procedure: INSERTION PORT-A-CATH WITH ULTRASOUND GUIDANCE;  Surgeon: Rolm Bookbinder, MD;  Location: [facility];  Service: General;  Laterality: Right;   REPLACEMENT TOTAL KNEE Right    SPINE SURGERY     TOTAL ABDOMINAL HYSTERECTOMY     FOR UTERUS CANCER   TOTAL HIP ARTHROPLASTY Left     Vitals:   12/25/20 1601  Weight: 164 lb (74.4 kg)     Subjective Assessment - 12/25/20 1600     Subjective Pt returns for her 3 month L-Dex screen.    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 She underwent a left lumpectomy and 1 positive node removed on 10/07/2019. Ki67 is 20%.  She has a history of uterine cancer in 2014 treated with hysterectomy and radiation, a left hip replacement, right knee replacement, and a C4-C7 fusion in 2014.                    L-DEX FLOWSHEETS - 12/25/20 1600       L-DEX LYMPHEDEMA SCREENING   Measurement Type Unilateral    L-DEX MEASUREMENT EXTREMITY Upper Extremity    POSITION  Standing    DOMINANT SIDE Right    At Risk Side Left    BASELINE SCORE (UNILATERAL) 3.3    L-DEX SCORE (UNILATERAL) 5.6    VALUE CHANGE (UNILAT) 2.3                                     PT Long Term Goals - 01/21/20 1056       PT LONG TERM GOAL #1   Title Pt will report  ability to get off the commode without pulling on the vanity    Time 8    Period Weeks    Status New      PT LONG TERM GOAL #2   Title Pt will improve BERG balance score to 45 or better to decrease risk of falls    Time 8    Period Weeks    Status New      PT LONG TERM GOAL #3   Title Pt will report no LOB backwards with standing activities x 1 week    Time 8    Period Weeks      PT LONG TERM GOAL #4   Title Pt will be ind with HEP similar to OTAGO    Time 8    Period Weeks    Status New                   Plan - 12/25/20 1608     Clinical Impression Statement Pt returns for her 3 month L-Dex screen. Her change from baseline of 2.3 WNLs so no further treatment is required at this time except to cont every 3 month L-Dex screens which pt is agreeable to.    PT Next Visit Plan Cont every 3 month L-Dex screens for up to 2 years from [facility] (~10/06/21)    Consulted and Agree with Plan of Care Patient             Patient will benefit from skilled therapeutic intervention in order to improve the following deficits and impairments:     Visit Diagnosis: Aftercare following surgery for neoplasm     Problem List Patient Active Problem List   Diagnosis Date Noted   Centrilobular emphysema ([place]) 07/31/2020   Shortness of breath 07/31/2020   Pressure injury of skin 04/30/2020   Hypothyroid    Anxiety    Depression    Port-A-Cath in place 11/24/2019   Hepatic steatosis 09/28/2019   Malignant neoplasm of upper-outer quadrant of left breast in female, estrogen receptor positive ([place]) 09/02/2019   Endometrial cancer ([place]) 10/23/2018   Cirrhosis of liver without ascites ([place]) 10/23/2018   Diarrhea 10/23/2018   Chest pain 02/15/2018   Coronary artery disease involving native coronary artery of native heart without angina pectoris 11/18/2017   Hyperlipidemia LDL goal <70 11/18/2017   Type 2 diabetes mellitus without complication, without long-term current use of insulin  ([place]) 11/18/2017   Dizziness 11/18/2017    Otelia Limes,  PTA 12/25/2020, 4:10 PM  [facility] @ [place], [place], [address] Phone: [phone]   Fax:  [phone]  Name: Felicia [name] MRN: [id number] Date of Birth: Nov 29, 1950

## 2020-12-27 ENCOUNTER — Telehealth: Payer: Self-pay

## 2020-12-27 ENCOUNTER — Telehealth: Payer: Self-pay | Admitting: Licensed Clinical Social Worker

## 2020-12-27 ENCOUNTER — Encounter: Payer: Self-pay | Admitting: Pulmonary Disease

## 2020-12-27 ENCOUNTER — Ambulatory Visit (INDEPENDENT_AMBULATORY_CARE_PROVIDER_SITE_OTHER): Payer: Medicare Other | Admitting: Pulmonary Disease

## 2020-12-27 ENCOUNTER — Other Ambulatory Visit: Payer: Self-pay

## 2020-12-27 VITALS — BP 118/70 | HR 81 | Temp 98.1°F | Ht 64.0 in | Wt 166.4 lb

## 2020-12-27 DIAGNOSIS — J439 Emphysema, unspecified: Secondary | ICD-10-CM

## 2020-12-27 DIAGNOSIS — J418 Mixed simple and mucopurulent chronic bronchitis: Secondary | ICD-10-CM | POA: Diagnosis not present

## 2020-12-27 DIAGNOSIS — R0602 Shortness of breath: Secondary | ICD-10-CM

## 2020-12-27 MED ORDER — PREDNISONE 20 MG PO TABS: 40.0000 mg | ORAL_TABLET | Freq: Every day | ORAL | 0 refills | Status: AC

## 2020-12-27 MED ORDER — SPIRIVA RESPIMAT 2.5 MCG/ACT IN AERS: 2.0000 | INHALATION_SPRAY | Freq: Every day | RESPIRATORY_TRACT | 0 refills | Status: DC

## 2020-12-27 MED ORDER — SPIRIVA RESPIMAT 2.5 MCG/ACT IN AERS: 2.0000 | INHALATION_SPRAY | Freq: Every day | RESPIRATORY_TRACT | 5 refills | Status: DC

## 2020-12-27 NOTE — Patient Instructions (Addendum)
Emphysema with possible exacerbation Radiation pneumonitis? --START prednisone 40 mg x 5 days --START Spiriva 2.5 mcg TWO puffs ONCE a day. Will provide sample if available --STOP Advair (purple) --CONTINUE Albuterol AS NEEDED for shortness of breath or wheezing.  --CT chest without contrast  Follow-up with me in Feb 2022

## 2020-12-27 NOTE — Telephone Encounter (Signed)
Patient states she saw her pulmonologist today. They advised the directions of the medication hasnt been changed her prescription for Metformin. It was Take 1 tablet (500 mg total) by mouth 2 (two) times daily with a meal she said that she was told to take 1 tablet in the AM and 2 tablets in PM.Asked if this can be updated.

## 2020-12-27 NOTE — Telephone Encounter (Signed)
[facility] Clinical Social Work  CSW attempted to contact patient to follow-up on housing concerns. No answer. Left VM with contact information.  Stefanie Ledwell with [facility] is also attempting to contact patient to assist with eviction mediation.   Christeen Douglas, LCSW

## 2020-12-27 NOTE — Progress Notes (Signed)
Subjective:   PATIENT ID: Felicia [name] GENDER: female DOB: 1950-10-29, MRN: [id number]   HPI  Chief Complaint  Patient presents with   Follow-up    Patient states that her chest is still hurting. Patient states that last week she was coughing a lot and felt bad.     Reason for Visit: Asthma follow-up  [name] is a 70 year old female never smoker with left breast cancer s/p radiation, CAD, DM and HTN who presents for new consult.  After completing one month after radiation, she reports having left sided chest pain/pressure with radiation to the the back associated with shortness of breath. She feels she cannot get a full breath and can be tight. Associated with dry cough. Denies chest congestion. Most nights a week she has discomfort related to this pain that will awaken. Denies bilateral chest pain. Talking will get her winded.  Denies any respiratory symptoms prior to radiation symptoms. Since treatment she has a difficult time to catching her breath.   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  12/27/20 She restarted on Advair daily for cough with occasional thick clear sputum and chest tightness. Denies wheezing. Does not feel like the inhaler is working. Denies fevers. Associated back or "lung" pain where she was radiated that seems right-sided but can be bilateral at times.   Social History: Second hand smoke exposure Reports she was born two months early  Past Medical History:  Diagnosis Date   Anemia    Anxiety    Breast cancer ([place]) 09/2019   left breast IMC   CAD (coronary artery disease)    a. 3/2007s/p DES to the LAD [facility]); b. 01/2017 MV: EF 80%, small, mild apical ant defect w/o ischemia (felt to be breast atten). Low risk.    Depression    DM (diabetes mellitus) ([place])    Essential hypertension    GERD (gastroesophageal reflux disease)    Headache    secondary to neck surgery per patient   High triglycerides    Hyperlipidemia    Hypothyroid    OA (osteoarthritis)    right knee,hands   Overflow incontinence    PONV (postoperative nausea and vomiting)    s/p gallbladder    RLS (restless legs syndrome)    Urinary urgency    Uterine cancer ([place]) dx'd 2014     Family History  Problem Relation Age of Onset   AAA (abdominal aortic aneurysm) Mother    Diabetes Father    Alzheimer's disease Father    Throat cancer Sister    Breast cancer Cousin      Social History   Occupational History   Not on file  Tobacco Use   Smoking status: Never   Smokeless tobacco: Never  Vaping Use   Vaping Use: Never used  Substance and Sexual Activity   Alcohol use: Yes    Comment: OCCASIONALLY   Drug use: No   Sexual activity: Not Currently    Birth control/protection: Surgical    Comment: hyst    Allergies  Allergen Reactions   Chloraprep One Step [Chlorhexidine Gluconate] Rash   Estrogens Other (See Comments)    PATIENT HAS A HISTORY OF CANCER AND HAS BEEN TOLD TO NEVER TAKE ANYTHING CONTAINING ESTROGEN, AS  IT MIGHT CAUSE A RECURRENCE   Shrimp [Shellfish Allergy] Nausea And Vomiting     Outpatient Medications Prior to Visit  Medication Sig Dispense Refill   ACCU-CHEK GUIDE test strip 1 each by Other route as needed.     albuterol (VENTOLIN HFA) 108 (90 Base) MCG/ACT inhaler Inhale 2 puffs into the lungs every 6 (six) hours as needed for wheezing or shortness of breath. 8 g 2   anastrozole (ARIMIDEX) 1 MG tablet Take 1 tablet (1 mg total) by mouth daily. 90 tablet 4   Ascorbic Acid (VITAMIN C PO) Take 1 tablet by mouth daily.     aspirin EC 81 MG tablet Take 81 mg by mouth daily.     atorvastatin (LIPITOR) 40 MG tablet TAKE 1 TABLET BY MOUTH EVERY DAY 90 tablet 2   bisoprolol (ZEBETA) 5 MG tablet TAKE 1  TABLET BY MOUTH EVERY DAY 90 tablet 2   Calcium Citrate-Vitamin D (CALCIUM + D PO) Take 1 tablet by mouth daily with supper.     CINNAMON PO Take 1 capsule by mouth daily with supper.     diclofenac sodium (VOLTAREN) 1 % GEL Apply 2-4 g topically 4 (four) times daily as needed (as directed for pain).     escitalopram (LEXAPRO) 10 MG tablet Take 1 tablet (10 mg total) by mouth at bedtime. 90 tablet 1   Ferrous Sulfate (IRON PO) Take 1 tablet by mouth daily. alternates days:1 tablet one day and 2 tablets the next day     gabapentin (NEURONTIN) 300 MG capsule Take 1 capsule (300 mg total) by mouth at bedtime. 90 capsule 4   GLIPIZIDE XL 10 MG 24 hr tablet Take 2 tablets (20 mg total) by mouth daily. 90 tablet 3   JARDIANCE 25 MG TABS tablet Take 1 tablet (25 mg total) by mouth every morning. 90 tablet 1   levothyroxine (SYNTHROID) 137 MCG tablet 1 TABLET IN THE MORNING ON AN EMPTY STOMACH ORALLY ONCE A DAY 90 90 tablet 1   metFORMIN (GLUCOPHAGE) 500 MG tablet Take 1 tablet (500 mg total) by mouth 2 (two) times daily with a meal. 180 tablet 1   metroNIDAZOLE (METROGEL) 1 % gel Apply 1 application topically as needed (as directed to affected area). 45 g 2   Multiple Vitamins-Calcium (ONE-A-DAY WOMENS PO) Take 1 tablet by mouth daily.     nitroGLYCERIN (NITROSTAT) 0.4 MG SL tablet Place 1 tablet (0.4 mg total) under the tongue every 5 (five) minutes as needed for chest pain. 25 tablet 6   omeprazole (PRILOSEC) 40 MG capsule Take 40 mg by mouth daily with supper.      prochlorperazine (COMPAZINE) 10 MG tablet TAKE 1 TABLET BY MOUTH BEFORE MEALS AND AT BEDTIME ON DAYS 2 AND 3 AFTER CHEMOTHERAPY (COUNTING CHEMOTHERAPY DAY AS DAY 1) AFTER THAT MAY TAKE AS NEEDED 30 tablet 1   rOPINIRole (REQUIP) 0.5 MG tablet 1 TABLET 1 TO 3 HOURS BEFORE BEDTIME ORALLY ONCE A DAY 90 DAYS 90 tablet 0   tiZANidine (ZANAFLEX) 4 MG tablet Take 4 mg by mouth every 8 (eight) hours as needed for muscle spasms. As needed      traMADol (ULTRAM) 50 MG tablet Take 1 tablet (50 mg total) by mouth every 6 (six) hours as needed for moderate pain. 30 tablet 0   TRULICITY 1.5 ZO/1.0RU SOPN INJECT 1.5 MG INTO THE SKIN ONCE A WEEK. 2 mL 2   TURMERIC PO Take 1 capsule by mouth daily with supper.  No facility-administered medications prior to visit.    Review of Systems  Constitutional:  Negative for chills, diaphoresis, fever, malaise/fatigue and weight loss.  HENT:  Negative for congestion.   Respiratory:  Positive for cough and sputum production. Negative for hemoptysis, shortness of breath and wheezing.   Cardiovascular:  Positive for chest pain. Negative for palpitations and leg swelling.    Objective:   There were no vitals filed for this visit.     Physical Exam: General: Well-appearing, no acute distress HENT: [place], AT Eyes: EOMI, no scleral icterus Respiratory: Clear to auscultation bilaterally.  No crackles, wheezing or rales Cardiovascular: RRR, -M/R/G, no JVD Extremities:-Edema,-tenderness Neuro: AAO x4, CNII-XII grossly intact Skin: Intact, no rashes or bruising Psych: Normal mood, normal affect  Data Reviewed:  Imaging: CTA 04/29/20 - No pulmonary emboli. Dependent bibasilar atelectasis. Diffuse emphysema with mosaic attenuation  PFT: 08/10/20 FVC 2.61 (87%) FEV1 2.39 (105%) Ratio 87  TLC 88% DLCO 117% Interpretation: Normal spirometry. Increased DLCO suggestive of asthma No significant bronchodilator response  Labs: CBC    Component Value Date/Time   WBC 7.6 11/16/2020 1002   WBC 5.4 05/01/2020 0347   RBC 4.21 11/16/2020 1002   HGB 13.1 11/16/2020 1002   HCT 41.0 11/16/2020 1002   PLT 149 (L) 11/16/2020 1002   MCV 97.4 11/16/2020 1002   MCH 31.1 11/16/2020 1002   MCHC 32.0 11/16/2020 1002   RDW 15.3 11/16/2020 1002   LYMPHSABS 3.2 11/16/2020 1002   MONOABS 0.7 11/16/2020 1002   EOSABS 0.1 11/16/2020 1002   BASOSABS 0.0 11/16/2020 1002   Echocardiogram 11/27/20 EF 60-65%. No  valvular or WMA  Assessment & Plan:   Discussion: 70 year old female never smoker with  hx CAD s/p LAD stent, breast cancer s/p left sided radiation 04/2020 completed. 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 PFTs normal. Echo normal. ?Radiation pneumonitis though prior CT with parenchymal findings in April. Though the time course of symptoms is atypical for pneumonitis. Would still be reasonable to pursue recent chest imaging to rule our parenchymal abnormalities for her symptoms.  She improved in late summer and Advair was discontinued however she restarted Advair three weeks ago for return of symptoms. Subtherapeutic dosing however was previously ineffective when first tried on Advair. We discussed bronchodilator management and decided to start LAMA for symptom relief and add-on different ICS/LABA in the future.  Emphysema with possible exacerbation Acute bronchitis Radiation pneumonitis? --START prednisone 40 mg x 5 days --START Spiriva 2.5 mcg TWO puffs ONCE a day. Will provide sample if available --STOP Advair (purple) --CONTINUE Albuterol AS NEEDED for shortness of breath or wheezing.  --CT chest without contrast   Health Maintenance Immunization History  Administered Date(s) Administered   Influenza, High Dose Seasonal PF 12/31/2016, 10/24/2017   Pneumococcal Conjugate-13 10/24/2017   Td 07/12/2005, 02/08/2019   CT Lung Screen - not indicated  Orders Placed This Encounter  Procedures   CT Chest Wo Contrast    Standing Status:   Future    Standing Expiration Date:   12/27/2021    Scheduling Instructions:     NEXT AVAIL    Order Specific Question:   Preferred imaging location?    Answer:   [facility] this encounter  Medications   Tiotropium Bromide Monohydrate (SPIRIVA RESPIMAT) 2.5 MCG/ACT AERS    Sig: Inhale 2 puffs into the lungs daily.    Dispense:  4 g  Refill:  5   predniSONE  (DELTASONE) 20 MG tablet    Sig: Take 2 tablets (40 mg total) by mouth daily with breakfast for 5 days.    Dispense:  10 tablet    Refill:  0   Tiotropium Bromide Monohydrate (SPIRIVA RESPIMAT) 2.5 MCG/ACT AERS    Sig: Inhale 2 puffs into the lungs daily.    Dispense:  4 g    Refill:  0    Order Specific Question:   Lot Number?    Answer:   [id number]    Order Specific Question:   Expiration Date?    Answer:   09/14/2021    Order Specific Question:   Quantity    Answer:   2   Return in about 9 weeks (around 02/28/2021).  I have spent a total time of 36-minutes on the day of the appointment reviewing prior documentation, coordinating care and discussing medical diagnosis and plan with the patient/family. Past medical history, allergies, medications were reviewed. Pertinent imaging, labs and tests included in this note have been reviewed and interpreted independently by me.  [place], MD [facility] 12/27/2020 8:51 AM  Office Number [phone]

## 2021-01-01 ENCOUNTER — Telehealth: Payer: Self-pay | Admitting: Physician Assistant

## 2021-01-01 NOTE — Telephone Encounter (Signed)
noted 

## 2021-01-01 NOTE — Telephone Encounter (Signed)
Copied from [place] [id number]. Topic: Medicare AWV >> Jan 01, 2021 10:32 AM Harris-Coley, Hannah Beat wrote: Reason for CRM: Left message for patient to schedule Annual Wellness Visit.  Please schedule with Nurse Health Advisor Charlott Rakes, RN at [facility].  Please call [phone] ask for [facility]

## 2021-01-10 ENCOUNTER — Encounter (HOSPITAL_COMMUNITY): Payer: Self-pay

## 2021-01-10 ENCOUNTER — Ambulatory Visit (HOSPITAL_COMMUNITY)
Admission: RE | Admit: 2021-01-10 | Discharge: 2021-01-10 | Disposition: A | Discharge status: Home / Self Care | Payer: Medicare Other | Source: Ambulatory Visit | Attending: Pulmonary Disease | Admitting: Pulmonary Disease

## 2021-01-10 ENCOUNTER — Other Ambulatory Visit: Payer: Self-pay

## 2021-01-10 DIAGNOSIS — J439 Emphysema, unspecified: Secondary | ICD-10-CM | POA: Diagnosis present

## 2021-01-10 DIAGNOSIS — R0602 Shortness of breath: Secondary | ICD-10-CM | POA: Insufficient documentation

## 2021-01-12 ENCOUNTER — Telehealth: Payer: Self-pay | Admitting: *Deleted

## 2021-01-16 ENCOUNTER — Other Ambulatory Visit: Payer: Self-pay

## 2021-01-16 MED ORDER — JARDIANCE 25 MG PO TABS
25.0000 mg | ORAL_TABLET | Freq: Every morning | ORAL | 1 refills | Status: DC
Start: 2021-01-16 — End: 2021-02-05

## 2021-01-16 MED ORDER — METFORMIN HCL 500 MG PO TABS: 500.0000 mg | ORAL_TABLET | ORAL | 0 refills | Status: DC

## 2021-01-16 NOTE — Telephone Encounter (Signed)
Pt called back and asked if a new script could be sent in for the metformin because she is out, she needs it with the 1 in the morning and 2 night. She also needs a refill on Jardiance sent in [place]

## 2021-01-16 NOTE — Telephone Encounter (Signed)
Rx sent in

## 2021-01-17 ENCOUNTER — Other Ambulatory Visit: Payer: Self-pay

## 2021-01-17 DIAGNOSIS — Z170 Estrogen receptor positive status [ER+]: Secondary | ICD-10-CM

## 2021-01-18 ENCOUNTER — Other Ambulatory Visit: Payer: Self-pay

## 2021-01-18 ENCOUNTER — Encounter: Payer: Self-pay | Admitting: Adult Health

## 2021-01-18 ENCOUNTER — Inpatient Hospital Stay: Payer: Medicare Other

## 2021-01-18 ENCOUNTER — Telehealth: Payer: Self-pay | Admitting: Pulmonary Disease

## 2021-01-18 ENCOUNTER — Inpatient Hospital Stay: Payer: Medicare Other | Attending: Oncology | Admitting: Adult Health

## 2021-01-18 VITALS — BP 138/74 | HR 91 | Temp 97.5°F | Resp 16 | Ht 64.0 in | Wt 162.3 lb

## 2021-01-18 DIAGNOSIS — Z818 Family history of other mental and behavioral disorders: Secondary | ICD-10-CM | POA: Diagnosis not present

## 2021-01-18 DIAGNOSIS — E039 Hypothyroidism, unspecified: Secondary | ICD-10-CM | POA: Insufficient documentation

## 2021-01-18 DIAGNOSIS — Z170 Estrogen receptor positive status [ER+]: Secondary | ICD-10-CM

## 2021-01-18 DIAGNOSIS — E781 Pure hyperglyceridemia: Secondary | ICD-10-CM | POA: Insufficient documentation

## 2021-01-18 DIAGNOSIS — E119 Type 2 diabetes mellitus without complications: Secondary | ICD-10-CM | POA: Diagnosis not present

## 2021-01-18 DIAGNOSIS — E1165 Type 2 diabetes mellitus with hyperglycemia: Secondary | ICD-10-CM | POA: Insufficient documentation

## 2021-01-18 DIAGNOSIS — I2510 Atherosclerotic heart disease of native coronary artery without angina pectoris: Secondary | ICD-10-CM | POA: Diagnosis not present

## 2021-01-18 DIAGNOSIS — I6782 Cerebral ischemia: Secondary | ICD-10-CM | POA: Insufficient documentation

## 2021-01-18 DIAGNOSIS — R059 Cough, unspecified: Secondary | ICD-10-CM | POA: Diagnosis not present

## 2021-01-18 DIAGNOSIS — I10 Essential (primary) hypertension: Secondary | ICD-10-CM | POA: Diagnosis not present

## 2021-01-18 DIAGNOSIS — C773 Secondary and unspecified malignant neoplasm of axilla and upper limb lymph nodes: Secondary | ICD-10-CM | POA: Insufficient documentation

## 2021-01-18 DIAGNOSIS — E785 Hyperlipidemia, unspecified: Secondary | ICD-10-CM | POA: Diagnosis not present

## 2021-01-18 DIAGNOSIS — R519 Headache, unspecified: Secondary | ICD-10-CM | POA: Insufficient documentation

## 2021-01-18 DIAGNOSIS — C50412 Malignant neoplasm of upper-outer quadrant of left female breast: Secondary | ICD-10-CM

## 2021-01-18 DIAGNOSIS — Z95828 Presence of other vascular implants and grafts: Secondary | ICD-10-CM

## 2021-01-18 DIAGNOSIS — Z5112 Encounter for antineoplastic immunotherapy: Secondary | ICD-10-CM | POA: Diagnosis present

## 2021-01-18 DIAGNOSIS — Z833 Family history of diabetes mellitus: Secondary | ICD-10-CM | POA: Insufficient documentation

## 2021-01-18 DIAGNOSIS — Z79899 Other long term (current) drug therapy: Secondary | ICD-10-CM | POA: Insufficient documentation

## 2021-01-18 DIAGNOSIS — R5383 Other fatigue: Secondary | ICD-10-CM | POA: Insufficient documentation

## 2021-01-18 DIAGNOSIS — Z808 Family history of malignant neoplasm of other organs or systems: Secondary | ICD-10-CM | POA: Diagnosis not present

## 2021-01-18 DIAGNOSIS — Z803 Family history of malignant neoplasm of breast: Secondary | ICD-10-CM | POA: Insufficient documentation

## 2021-01-18 LAB — CMP (CANCER CENTER ONLY)
ALT: 28 U/L (ref 0–44)
AST: 28 U/L (ref 15–41)
Albumin: 4.1 g/dL (ref 3.5–5.0)
Alkaline Phosphatase: 67 U/L (ref 38–126)
Anion gap: 11 (ref 5–15)
BUN: 16 mg/dL (ref 8–23)
CO2: 26 mmol/L (ref 22–32)
Calcium: 10.2 mg/dL (ref 8.9–10.3)
Chloride: 102 mmol/L (ref 98–111)
Creatinine: 0.61 mg/dL (ref 0.44–1.00)
GFR, Estimated: >60 mL/min (ref >60)
Glucose, Bld: 235 mg/dL — ABNORMAL HIGH (ref 70–99)
Potassium: 4.3 mmol/L (ref 3.5–5.1)
Sodium: 139 mmol/L (ref 135–145)
Total Bilirubin: 0.4 mg/dL (ref 0.3–1.2)
Total Protein: 7.5 g/dL (ref 6.5–8.1)

## 2021-01-18 LAB — CBC WITH DIFFERENTIAL (CANCER CENTER ONLY)
Abs Immature Granulocytes: 0.04 10*3/uL (ref 0.00–0.07)
Basophils Absolute: 0 10*3/uL (ref 0.0–0.1)
Basophils Relative: 0 %
Eosinophils Absolute: 0.1 10*3/uL (ref 0.0–0.5)
Eosinophils Relative: 1 %
HCT: 43.6 % (ref 36.0–46.0)
Hemoglobin: 14.1 g/dL (ref 12.0–15.0)
Immature Granulocytes: 1 %
Lymphocytes Relative: 41 %
Lymphs Abs: 3.4 10*3/uL (ref 0.7–4.0)
MCH: 31 pg (ref 26.0–34.0)
MCHC: 32.3 g/dL (ref 30.0–36.0)
MCV: 95.8 fL (ref 80.0–100.0)
Monocytes Absolute: 0.6 10*3/uL (ref 0.1–1.0)
Monocytes Relative: 7 %
Neutro Abs: 4.1 10*3/uL (ref 1.7–7.7)
Neutrophils Relative %: 50 %
Platelet Count: 184 10*3/uL (ref 150–400)
RBC: 4.55 MIL/uL (ref 3.87–5.11)
RDW: 14.9 % (ref 11.5–15.5)
WBC Count: 8.3 10*3/uL (ref 4.0–10.5)
nRBC: 0 % (ref 0.0–0.2)

## 2021-01-18 MED ORDER — SODIUM CHLORIDE 0.9% FLUSH
10.0000 mL | Freq: Once | INTRAVENOUS | Status: AC
  Administered 2021-01-18: 10 mL

## 2021-01-18 MED ORDER — HEPARIN SOD (PORK) LOCK FLUSH 100 UNIT/ML IV SOLN
500.0000 [IU] | Freq: Once | INTRAVENOUS | Status: AC
  Administered 2021-01-18: 500 [IU]

## 2021-01-18 NOTE — Telephone Encounter (Signed)
Patient seen in Heme/Onc clinic today. NP sent message "She notes that she is having difficulty with her cough and breathing. It is not worse it is just not better. She is coughing productive colored sputum and has posterior chest wall pain particularly in her left lateral chest wall. She notes she has difficulty taking a deep breath."  Staff- please call patient to schedule follow-up with me. I have available tomorrow. Also ok to overbook 10 AM and 12 PM slots

## 2021-01-18 NOTE — Progress Notes (Signed)
SURVIVORSHIP VISIT:   BRIEF ONCOLOGIC HISTORY:  Oncology History Overview Note   Cancer Staging  Malignant neoplasm of upper-outer quadrant of left breast in female, estrogen receptor positive ([place]) Staging form: Breast, AJCC 8th Edition - Clinical stage from 09/08/2019: Stage IA (cT1c, cN0, cM0, G2, ER+, PR+, HER2+) - Unsigned - Pathologic stage from 10/07/2019: Stage IA (pT1c, pN1, cM0, G2, ER+, PR+, HER2+) - Signed by Gardenia Phlegm, NP on 01/09/2021    Malignant neoplasm of upper-outer quadrant of left breast in female, estrogen receptor positive ([place])  08/30/2019 Initial Biopsy   Diagnosis Breast, left, needle core biopsy, 2 o'clock, 10cmfn - INVASIVE MAMMARY CARCINOMA. Microscopic Comment The carcinoma is nuclear grade 2. Micropapillary features are noted. The greatest linear extent of tumor in any one core is 6 mm. ADDENDUM: Immunohistochemistry for E-cadherin is positive supporting a ductal phenotype.  PROGNOSTIC INDICATORS Results: The tumor cells are EQUIVOCAL for Her2 (2+). Her2 by FISH will be performed and results reported separately. Estrogen Receptor: 100%, POSITIVE, STRONG STAINING INTENSITY Progesterone Receptor: 40%, POSITIVE, STRONG STAINING INTENSITY Proliferation Marker Ki67: 20%  FLUORESCENCE IN-SITU HYBRIDIZATION Results: GROUP 1: HER2 **POSITIVE**   09/02/2019 Initial Diagnosis   Malignant neoplasm of upper-outer quadrant of left breast in female, estrogen receptor positive ([place])   09/30/2019 - 09/30/2019 Chemotherapy   The patient had trastuzumab-dkst (OGIVRI) 630 mg in sodium chloride 0.9 % 250 mL chemo infusion, 8 mg/kg, Intravenous,  Once, 0 of 5 cycles  for chemotherapy treatment.     10/07/2019 Cancer Staging   Staging form: Breast, AJCC 8th Edition - Pathologic stage from 10/07/2019: Stage IA (pT1c, pN1, cM0, G2, ER+, PR+, HER2+) - Signed by Gardenia Phlegm, NP on 01/09/2021 Histologic grading system: 3 grade system    10/07/2019  Definitive Surgery   FINAL MICROSCOPIC DIAGNOSIS:   A. BREAST, LEFT, LUMPECTOMY:  -  Invasive ductal carcinoma, Nottingham grade 2 of 3, 1.4 cm  -  Ductal carcinoma in-situ, high grade  -  Margins uninvolved by carcinoma (see parts C, D and E)  -  Previous biopsy site changes present  -  See oncology table and comment below   B. LYMPH NODE, LEFT AXILLARY, SENTINEL, EXCISION:  -  Metastatic carcinoma involving one lymph node (1/1), 0.3 cm  -  Focal extracapsular extension   C. BREAST, LEFT, ADDITIONAL MEDIAL MARGIN, EXCISION:  -  Usual ductal hyperplasia  -  No residual carcinoma identified  -  See comment   D. BREAST, LEFT, ADDITIONAL INFERIOR MARGIN, EXCISION:  -  No residual carcinoma identified   E. BREAST, LEFT, ADDITIONAL POSTERIOR MARGIN, EXCISION:  -  No residual carcinoma identified   COMMENT:   A.  The carcinoma has micropapillary features.   C.  Cytokeratin 5/6 and E-cadherin are positive in the usual ductal  hyperplasia.    11/24/2019 - 01/28/2020 Chemotherapy   adjuvant chemotherapy consisting of carboplatin, gemcitabine, trastuzumab given every 3 weeks x4  -discontinued after 4 cycles with poor tolerance.         02/16/2020 - 11/16/2020 Chemotherapy   Patient is on Treatment Plan : BREAST ADO-Trastuzumab Emtansine ([place]) q21d      03/30/2020 - 05/12/2020 Radiation Therapy   Site Technique Total Dose (Gy) Dose per Fx (Gy) Completed Fx Beam Energies  Breast, Left: Breast_Lt 3D 50/50 2 25/25 10X  Breast, Left: Breast_Lt_SCV_PAB 3D 50/50 2 25/25 10X, 15X  Breast, Left: Breast_Lt_Bst 3D 10/10 2 5/5 6X, 10X     04/11/2020 Imaging   EXAM: MRI HEAD  WITHOUT AND WITH CONTRAST  IMPRESSION: 1. No evidence of intracranial metastatic disease. 2. Mild chronic small vessel ischemia.   11/16/2020 -  Anti-estrogen oral therapy   Anastrozole, 1 mg daily     INTERVAL HISTORY:  Ms. Batts to review her survivorship care plan detailing her treatment course for  breast cancer, as well as monitoring long-term side effects of that treatment, education regarding health maintenance, screening, and overall wellness and health promotion.     Overall, Ms. Canner reports feeling moderately well today.  She continues on anastrozole daily with good tolerance.  She denies any hot flashes, vaginal dryness, arthralgias.  Helma does have issues with her breathing.  She underwent a CT scan of the chest on December 29 and has not yet received the results.  She wonders what might be going on with her lungs.  She does note that her pulmonologist prescribed prednisone and an inhaler.  She is coughing up some colored sputum.  She notes when she talks for longer periods of time she loses her voice and she begins to get winded.  Her oxygen saturation today is 97%.  Shelby notes new headaches over her left eye that radiate posteriorly.  She says these are becoming more frequent and are worsening.  She says that Excedrin, ibuprofen, aspirin, and Tylenol do not help the headaches.  She has an appointment with her eye specialist next week.  She does not have any focal weakness, numbness or tingling, blurred vision, or any other neurologic issues.  REVIEW OF SYSTEMS:  Review of Systems  Constitutional:  Positive for fatigue. Negative for appetite change, chills, fever and unexpected weight change.  HENT:   Negative for hearing loss, lump/mass and trouble swallowing.   Eyes:  Negative for eye problems and icterus.  Respiratory:  Positive for cough. Negative for chest tightness, shortness of breath and wheezing.   Cardiovascular:  Negative for chest pain, leg swelling and palpitations.  Gastrointestinal:  Negative for abdominal distention, abdominal pain, constipation, diarrhea, nausea and vomiting.  Endocrine: Negative for hot flashes.  Genitourinary:  Negative for difficulty urinating.   Musculoskeletal:  Negative for arthralgias and gait problem.  Skin:  Negative for itching and  rash.  Neurological:  Positive for headaches. Negative for dizziness, extremity weakness, gait problem, light-headedness, numbness, seizures and speech difficulty.  Hematological:  Negative for adenopathy. Does not bruise/bleed easily.  Psychiatric/Behavioral:  Negative for depression. The patient is not nervous/anxious.   Breast: Denies any new nodularity, masses, tenderness, nipple changes, or nipple discharge.    ONCOLOGY TREATMENT TEAM:  1. Surgeon:  Dr. Donne Hazel at [facility] 2. Medical Oncologist: Dr. Lindi Adie  3. Radiation Oncologist: Dr. Isidore Moos    PAST MEDICAL/SURGICAL HISTORY:  Past Medical History:  Diagnosis Date   Anemia    Anxiety    Breast cancer ([place]) 09/2019   left breast IMC   CAD (coronary artery disease)    a. 3/2007s/p DES to the LAD [facility]); b. 01/2017 MV: EF 80%, small, mild apical ant defect w/o ischemia (felt to be breast atten). Low risk.   Depression    DM (diabetes mellitus) ([place])    Essential hypertension    GERD (gastroesophageal reflux disease)    Headache    secondary to neck surgery per patient   High triglycerides    Hyperlipidemia    Hypothyroid    OA (osteoarthritis)    right knee,hands   Overflow incontinence    PONV (postoperative nausea and vomiting)    s/p  gallbladder    RLS (restless legs syndrome)    Urinary urgency    Uterine cancer ([place]) dx'd 2014   Past Surgical History:  Procedure Laterality Date   BREAST LUMPECTOMY WITH RADIOACTIVE SEED AND SENTINEL LYMPH NODE BIOPSY Left 10/07/2019   Procedure: LEFT BREAST LUMPECTOMY WITH RADIOACTIVE SEED AND SENTINEL LYMPH NODE BIOPSY;  Surgeon: Rolm Bookbinder, MD;  Location: [facility];  Service: General;  Laterality: Left;   CORONARY STENT PLACEMENT     CORONARY/GRAFT ACUTE MI REVASCULARIZATION     FINGER ARTHRODESIS Right 06/11/2019   Procedure: ARTHRODESIS INDEX FINGER DISTAL PHALANGEAL JOINT;  Surgeon: [name], MD;  Location: [facility];  Service: Orthopedics;  Laterality: Right;   FOOT SURGERY Left    LEFT HEART CATH AND CORONARY ANGIOGRAPHY N/A 02/16/2018   Procedure: LEFT HEART CATH AND CORONARY ANGIOGRAPHY;  Surgeon: Lorretta Harp, MD;  Location: [facility];  Service: Cardiovascular;  Laterality: N/A;   LEFT HEART CATH AND CORONARY ANGIOGRAPHY N/A 05/01/2020   Procedure: LEFT HEART CATH AND CORONARY ANGIOGRAPHY;  Surgeon: Lorretta Harp, MD;  Location: [facility];  Service: Cardiovascular;  Laterality: N/A;   PORTACATH PLACEMENT Right 11/23/2019   Procedure: INSERTION PORT-A-CATH WITH ULTRASOUND GUIDANCE;  Surgeon: Rolm Bookbinder, MD;  Location: [facility];  Service: General;  Laterality: Right;   REPLACEMENT TOTAL KNEE Right    SPINE SURGERY     TOTAL ABDOMINAL HYSTERECTOMY     FOR UTERUS CANCER   TOTAL HIP ARTHROPLASTY Left      ALLERGIES:  Allergies  Allergen Reactions   Chloraprep One Step [Chlorhexidine Gluconate] Rash   Estrogens Other (See Comments)    PATIENT HAS A HISTORY OF CANCER AND HAS BEEN TOLD TO NEVER TAKE ANYTHING CONTAINING ESTROGEN, AS IT MIGHT CAUSE A RECURRENCE   Shrimp [Shellfish Allergy] Nausea And Vomiting     CURRENT MEDICATIONS:  Outpatient Encounter Medications as of 01/18/2021  Medication Sig   ACCU-CHEK GUIDE test strip 1 each by Other route as needed.   albuterol (VENTOLIN HFA) 108 (90 Base) MCG/ACT inhaler Inhale 2 puffs into the lungs every 6 (six) hours as needed for wheezing or shortness of breath.   anastrozole (ARIMIDEX) 1 MG tablet Take 1 tablet (1 mg total) by mouth daily.   Ascorbic Acid (VITAMIN C PO) Take 1 tablet by mouth daily.   aspirin EC 81 MG tablet Take 81 mg by mouth daily.   atorvastatin (LIPITOR) 40 MG tablet TAKE 1 TABLET BY MOUTH EVERY DAY   bisoprolol (ZEBETA) 5 MG tablet TAKE 1 TABLET BY MOUTH EVERY DAY   Calcium Citrate-Vitamin D (CALCIUM + D PO) Take 1 tablet by mouth daily with supper.   CINNAMON PO  Take 1 capsule by mouth daily with supper.   diclofenac sodium (VOLTAREN) 1 % GEL Apply 2-4 g topically 4 (four) times daily as needed (as directed for pain).   escitalopram (LEXAPRO) 10 MG tablet Take 1 tablet (10 mg total) by mouth at bedtime.   Ferrous Sulfate (IRON PO) Take 1 tablet by mouth daily. alternates days:1 tablet one day and 2 tablets the next day   gabapentin (NEURONTIN) 300 MG capsule Take 1 capsule (300 mg total) by mouth at bedtime.   GLIPIZIDE XL 10 MG 24 hr tablet Take 2 tablets (20 mg total) by mouth daily.   JARDIANCE 25 MG TABS tablet Take 1 tablet (25 mg total) by mouth every morning.   levothyroxine (SYNTHROID) 137 MCG  tablet 1 TABLET IN THE MORNING ON AN EMPTY STOMACH ORALLY ONCE A DAY 90   metFORMIN (GLUCOPHAGE) 500 MG tablet Take 1 tablet (500 mg total) by mouth as directed. Patient takes 1 tablet in AM and 2 tablets in PM   metroNIDAZOLE (METROGEL) 1 % gel Apply 1 application topically as needed (as directed to affected area).   Multiple Vitamins-Calcium (ONE-A-DAY WOMENS PO) Take 1 tablet by mouth daily.   nitroGLYCERIN (NITROSTAT) 0.4 MG SL tablet Place 1 tablet (0.4 mg total) under the tongue every 5 (five) minutes as needed for chest pain.   omeprazole (PRILOSEC) 40 MG capsule Take 40 mg by mouth daily with supper.    prochlorperazine (COMPAZINE) 10 MG tablet TAKE 1 TABLET BY MOUTH BEFORE MEALS AND AT BEDTIME ON DAYS 2 AND 3 AFTER CHEMOTHERAPY (COUNTING CHEMOTHERAPY DAY AS DAY 1) AFTER THAT MAY TAKE AS NEEDED   rOPINIRole (REQUIP) 0.5 MG tablet 1 TABLET 1 TO 3 HOURS BEFORE BEDTIME ORALLY ONCE A DAY 90 DAYS   Tiotropium Bromide Monohydrate (SPIRIVA RESPIMAT) 2.5 MCG/ACT AERS Inhale 2 puffs into the lungs daily.   Tiotropium Bromide Monohydrate (SPIRIVA RESPIMAT) 2.5 MCG/ACT AERS Inhale 2 puffs into the lungs daily.   tiZANidine (ZANAFLEX) 4 MG tablet Take 4 mg by mouth every 8 (eight) hours as needed for muscle spasms. As needed   traMADol (ULTRAM) 50 MG tablet Take  1 tablet (50 mg total) by mouth every 6 (six) hours as needed for moderate pain.   TRULICITY 1.5 IR/5.1OA SOPN INJECT 1.5 MG INTO THE SKIN ONCE A WEEK.   TURMERIC PO Take 1 capsule by mouth daily with supper.   [EXPIRED] heparin lock flush 100 unit/mL    [EXPIRED] sodium chloride flush (NS) 0.9 % injection 10 mL    No facility-administered encounter medications on file as of 01/18/2021.     ONCOLOGIC FAMILY HISTORY:  Family History  Problem Relation Age of Onset   AAA (abdominal aortic aneurysm) Mother    Diabetes Father    Alzheimer's disease Father    Throat cancer Sister    Breast cancer Cousin      GENETIC COUNSELING/TESTING: See above  SOCIAL HISTORY:  Social History   Socioeconomic History   Marital status: Divorced    Spouse name: Not on file   Number of children: Not on file   Years of education: Not on file   Highest education level: Not on file  Occupational History   Not on file  Tobacco Use   Smoking status: Never   Smokeless tobacco: Never  Vaping Use   Vaping Use: Never used  Substance and Sexual Activity   Alcohol use: Yes    Comment: OCCASIONALLY   Drug use: No   Sexual activity: Not Currently    Birth control/protection: Surgical    Comment: hyst  Other Topics Concern   Not on file  Social History Narrative   Originally from [place], moved here in 2018.    Social Determinants of Health   Financial Resource Strain: High Risk   Difficulty of Paying Living Expenses: Very hard  Food Insecurity: Not on file  Transportation Needs: Not on file  Physical Activity: Not on file  Stress: Not on file  Social Connections: Not on file  Intimate Partner Violence: Not on file     OBSERVATIONS/OBJECTIVE:  BP 138/74 (BP Location: Left Arm, Patient Position: Sitting)    Pulse 91    Temp (!) 97.5 F (36.4 C) (Temporal)    Resp  16    Ht 5' 4"  (1.626 m)    Wt 162 lb 4.8 oz (73.6 kg)    SpO2 97%    BMI 27.86 kg/m  GENERAL: Patient is a well appearing  female in no acute distress HEENT:  Sclerae anicteric.  Oropharynx clear and moist. No ulcerations or evidence of oropharyngeal candidiasis. Neck is supple.  NODES:  No cervical, supraclavicular, or axillary lymphadenopathy palpated.  BREAST EXAM: Left breast status postlumpectomy and radiation no sign of local recurrence right breast is benign. LUNGS:  Clear to auscultation bilaterally.  No wheezes or rhonchi. HEART:  Regular rate and rhythm. No murmur appreciated. ABDOMEN:  Soft, nontender.  Positive, normoactive bowel sounds. No organomegaly palpated. MSK:  No focal spinal tenderness to palpation. Full range of motion bilaterally in the upper extremities. EXTREMITIES:  No peripheral edema.   SKIN:  Clear with no obvious rashes or skin changes. No nail dyscrasia. NEURO:  Nonfocal. Well oriented.  Appropriate affect.   LABORATORY DATA:  None for this visit.  DIAGNOSTIC IMAGING:  None for this visit.      ASSESSMENT AND PLAN:  Ms.. Ante is a pleasant 71 y.o. female with Stage IA left breast invasive ductal carcinoma, ER+/PR+/HER2+, diagnosed in August 2021, treated with lumpectomy, adjuvant chemotherapy ,adjuvant radiation therapy, and anti-estrogen therapy with anastrozole beginning in November 2022.  She presents to the Survivorship Clinic for our initial meeting and routine follow-up post-completion of treatment for breast cancer.    1. Stage IA left breast cancer:  Ms. Briley is continuing to recover from definitive treatment for breast cancer. She will follow-up with her medical oncologist, Dr. Lindi Adie in 6 months with history and physical exam per surveillance protocol.  She will continue her anti-estrogen therapy with anastrozole. Thus far, she is tolerating the anastrozole well, with minimal side effects. She was instructed to make Dr. Lindi Adie or myself aware if she begins to experience any worsening side effects of the medication and I could see her back in clinic to help manage those  side effects, as needed. Her mammogram is due August 2023. Today, a comprehensive survivorship care plan and treatment summary was reviewed with the patient today detailing her breast cancer diagnosis, treatment course, potential late/long-term effects of treatment, appropriate follow-up care with recommendations for the future, and patient education resources.  A copy of this summary, along with a letter will be sent to the patients primary care provider via mail/fax/In Basket message after todays visit.    2.  Headaches: These are worsening and she is not able to identify any medications that can help alleviate these headaches when they are at their worst.  I have ordered a brain MRI to further evaluate.  She will continue to follow-up with her eye specialist next week.  3.  Cough and pulmonary concerns.  I reviewed her CT chest with her that she underwent last week.  It was concerning for infection.  I also sent her pulmonologist a message.  Mykelti plans on following up with her pulmonologist.  She is not having any fevers or chills.  4. Bone health:  Given Ms. Worrell's age/history of breast cancer and her current treatment regimen including anti-estrogen therapy with anastrozole, she is at risk for bone demineralization.  We are getting this scheduled.  She was given education on specific activities to promote bone health.  5. Cancer screening:  Due to Ms. Dobies's history and her age, she should receive screening for skin cancers, colon cancer, and gynecologic cancers.  The information and recommendations are listed on the patient's comprehensive care plan/treatment summary and were reviewed in detail with the patient.    6. Health maintenance and wellness promotion: Ms. Kalama was encouraged to consume 5-7 servings of fruits and vegetables per day. We reviewed the "Nutrition Rainbow" handout.  She was also encouraged to engage in moderate to vigorous exercise for 30 minutes per day most days of the  week. We discussed the [employer] fitness program, which is designed for cancer survivors to help them become more physically fit after cancer treatments.  She was instructed to limit her alcohol consumption and continue to abstain from tobacco use.     7. Support services/counseling: It is not uncommon for this period of the patient's cancer care trajectory to be one of many emotions and stressors.    She was given information regarding our available services and encouraged to contact me with any questions or for help enrolling in any of our support group/programs.    Follow up instructions:    -Return to cancer center in 6 months for f/u  -Mammogram due in 08/2021 -Follow up with surgery per Dr. Donne Hazel -MRI brain -She is welcome to return back to the Survivorship Clinic at any time; no additional follow-up needed at this time.  -Consider referral back to survivorship as a long-term survivor for continued surveillance  The patient was provided an opportunity to ask questions and all were answered. The patient agreed with the plan and demonstrated an understanding of the instructions.   Total encounter time: 65 minutes in face-to-face visit time, lab review, chart review, care coordination, order entry, and documentation of the encounter.  Wilber Bihari, NP 01/18/21 2:41 PM Medical Oncology and Hematology [facility] [place], [place] [address] Tel. [phone]    Fax. [phone]  *Total Encounter Time as defined by the Centers for Medicare and Medicaid Services includes, in addition to the face-to-face time of a patient visit (documented in the note above) non-face-to-face time: obtaining and reviewing outside history, ordering and reviewing medications, tests or procedures, care coordination (communications with other health care professionals or caregivers) and documentation in the medical record.

## 2021-01-18 NOTE — Telephone Encounter (Signed)
Called and spoke with patient. I was able to get her scheduled to see JE tomorrow at 12pm.   Nothing further needed at time of call.

## 2021-01-19 ENCOUNTER — Ambulatory Visit (INDEPENDENT_AMBULATORY_CARE_PROVIDER_SITE_OTHER): Payer: Medicare Other | Admitting: Pulmonary Disease

## 2021-01-19 ENCOUNTER — Encounter: Payer: Self-pay | Admitting: Pulmonary Disease

## 2021-01-19 ENCOUNTER — Encounter: Payer: Self-pay | Admitting: Licensed Clinical Social Worker

## 2021-01-19 VITALS — BP 110/68 | HR 82 | Temp 97.9°F | Ht 64.0 in | Wt 162.4 lb

## 2021-01-19 DIAGNOSIS — J432 Centrilobular emphysema: Secondary | ICD-10-CM | POA: Diagnosis not present

## 2021-01-19 DIAGNOSIS — R490 Dysphonia: Secondary | ICD-10-CM

## 2021-01-19 DIAGNOSIS — J189 Pneumonia, unspecified organism: Secondary | ICD-10-CM

## 2021-01-19 MED ORDER — AMOXICILLIN-POT CLAVULANATE 875-125 MG PO TABS: 1.0000 | ORAL_TABLET | Freq: Two times a day (BID) | ORAL | 0 refills | Status: AC

## 2021-01-19 NOTE — Progress Notes (Signed)
Subjective:   PATIENT ID: Felicia [name] GENDER: female DOB: July 09, 1950, MRN: [id number]   HPI  Chief Complaint  Patient presents with   Follow-up    Winded  Back pain    Reason for Visit: Asthma follow-up  [name] is a 71 year old female never smoker with left breast cancer s/p radiation, CAD, DM and HTN who presents for follow-up  After completing one month after radiation, she reports having left sided chest pain/pressure with radiation to the the back associated with shortness of breath. She feels she cannot get a full breath and can be tight. Associated with dry cough. Denies chest congestion. Most nights a week she has discomfort related to this pain that will awaken. Denies bilateral chest pain. Talking will get her winded.  Denies any respiratory symptoms prior to radiation symptoms. Since treatment she has a difficult time to catching her breath.   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  12/27/20 She restarted on Advair daily for cough with occasional thick clear sputum and chest tightness. Denies wheezing. Does not feel like the inhaler is working. Denies fevers. Associated back or "lung" pain where she was radiated that seems right-sided but can be bilateral at times.   01/19/21 She is still having chest pain/tightness that is worse at night. Reports nasal congestion and coughing. It feels like "lung" pain that is bilateral. She has been more hoarse with sore throat that began prior to inhaler use. She loses her voice with talking. She reports in 2017 needing a Speech therapist after a TIA.  Social History: Second hand smoke exposure Reports she was born two months early  Past Medical History:  Diagnosis Date   Anemia    Anxiety    Breast  cancer ([place]) 09/2019   left breast IMC   CAD (coronary artery disease)    a. 3/2007s/p DES to the LAD [facility]); b. 01/2017 MV: EF 80%, small, mild apical ant defect w/o ischemia (felt to be breast atten). Low risk.   Depression    DM (diabetes mellitus) ([place])    Essential hypertension    GERD (gastroesophageal reflux disease)    Headache    secondary to neck surgery per patient   High triglycerides    Hyperlipidemia    Hypothyroid    OA (osteoarthritis)    right knee,hands   Overflow incontinence    PONV (postoperative nausea and vomiting)    s/p gallbladder    RLS (restless legs syndrome)    Urinary urgency    Uterine cancer ([place]) dx'd 2014     Family History  Problem Relation Age of Onset   AAA (abdominal aortic aneurysm) Mother    Diabetes Father    Alzheimer's disease Father    Throat cancer Sister    Breast cancer Cousin      Social History   Occupational History   Not on file  Tobacco Use   Smoking status: Never   Smokeless tobacco: Never  Vaping Use   Vaping Use: Never used  Substance and Sexual Activity   Alcohol use: Yes    Comment: OCCASIONALLY   Drug use: No   Sexual activity: Not Currently    Birth control/protection: Surgical    Comment: hyst  Allergies  Allergen Reactions   Chloraprep One Step [Chlorhexidine Gluconate] Rash   Estrogens Other (See Comments)    PATIENT HAS A HISTORY OF CANCER AND HAS BEEN TOLD TO NEVER TAKE ANYTHING CONTAINING ESTROGEN, AS IT MIGHT CAUSE A RECURRENCE   Shrimp [Shellfish Allergy] Nausea And Vomiting     Outpatient Medications Prior to Visit  Medication Sig Dispense Refill   ACCU-CHEK GUIDE test strip 1 each by Other route as needed.     albuterol (VENTOLIN HFA) 108 (90 Base) MCG/ACT inhaler Inhale 2 puffs into the lungs every 6 (six) hours as needed for wheezing or shortness of breath. 8 g 2   anastrozole (ARIMIDEX) 1 MG tablet Take 1 tablet (1 mg total) by mouth daily. 90 tablet 4   Ascorbic Acid  (VITAMIN C PO) Take 1 tablet by mouth daily.     aspirin EC 81 MG tablet Take 81 mg by mouth daily.     atorvastatin (LIPITOR) 40 MG tablet TAKE 1 TABLET BY MOUTH EVERY DAY 90 tablet 2   bisoprolol (ZEBETA) 5 MG tablet TAKE 1 TABLET BY MOUTH EVERY DAY 90 tablet 2   Calcium Citrate-Vitamin D (CALCIUM + D PO) Take 1 tablet by mouth daily with supper.     CINNAMON PO Take 1 capsule by mouth daily with supper.     diclofenac sodium (VOLTAREN) 1 % GEL Apply 2-4 g topically 4 (four) times daily as needed (as directed for pain).     escitalopram (LEXAPRO) 10 MG tablet Take 1 tablet (10 mg total) by mouth at bedtime. 90 tablet 1   Ferrous Sulfate (IRON PO) Take 1 tablet by mouth daily. alternates days:1 tablet one day and 2 tablets the next day     gabapentin (NEURONTIN) 300 MG capsule Take 1 capsule (300 mg total) by mouth at bedtime. 90 capsule 4   GLIPIZIDE XL 10 MG 24 hr tablet Take 2 tablets (20 mg total) by mouth daily. 90 tablet 3   JARDIANCE 25 MG TABS tablet Take 1 tablet (25 mg total) by mouth every morning. 90 tablet 1   levothyroxine (SYNTHROID) 137 MCG tablet 1 TABLET IN THE MORNING ON AN EMPTY STOMACH ORALLY ONCE A DAY 90 90 tablet 1   metFORMIN (GLUCOPHAGE) 500 MG tablet Take 1 tablet (500 mg total) by mouth as directed. Patient takes 1 tablet in AM and 2 tablets in PM 270 tablet 0   metroNIDAZOLE (METROGEL) 1 % gel Apply 1 application topically as needed (as directed to affected area). 45 g 2   Multiple Vitamins-Calcium (ONE-A-DAY WOMENS PO) Take 1 tablet by mouth daily.     nitroGLYCERIN (NITROSTAT) 0.4 MG SL tablet Place 1 tablet (0.4 mg total) under the tongue every 5 (five) minutes as needed for chest pain. 25 tablet 6   omeprazole (PRILOSEC) 40 MG capsule Take 40 mg by mouth daily with supper.      prochlorperazine (COMPAZINE) 10 MG tablet TAKE 1 TABLET BY MOUTH BEFORE MEALS AND AT BEDTIME ON DAYS 2 AND 3 AFTER CHEMOTHERAPY (COUNTING CHEMOTHERAPY DAY AS DAY 1) AFTER THAT MAY TAKE AS  NEEDED 30 tablet 1   rOPINIRole (REQUIP) 0.5 MG tablet 1 TABLET 1 TO 3 HOURS BEFORE BEDTIME ORALLY ONCE A DAY 90 DAYS 90 tablet 0   Tiotropium Bromide Monohydrate (SPIRIVA RESPIMAT) 2.5 MCG/ACT AERS Inhale 2 puffs into the lungs daily. 4 g 5   Tiotropium Bromide Monohydrate (SPIRIVA RESPIMAT) 2.5 MCG/ACT AERS Inhale 2 puffs into the lungs daily. 4 g 0  tiZANidine (ZANAFLEX) 4 MG tablet Take 4 mg by mouth every 8 (eight) hours as needed for muscle spasms. As needed     traMADol (ULTRAM) 50 MG tablet Take 1 tablet (50 mg total) by mouth every 6 (six) hours as needed for moderate pain. 30 tablet 0   TRULICITY 1.5 BS/9.6GE SOPN INJECT 1.5 MG INTO THE SKIN ONCE A WEEK. 2 mL 2   TURMERIC PO Take 1 capsule by mouth daily with supper.     No facility-administered medications prior to visit.    Review of Systems  Constitutional:  Negative for chills, diaphoresis, fever, malaise/fatigue and weight loss.  HENT:  Positive for congestion and sore throat.   Respiratory:  Positive for cough. Negative for hemoptysis, sputum production, shortness of breath and wheezing.   Cardiovascular:  Negative for chest pain, palpitations and leg swelling.    Objective:   Vitals:   01/19/21 1150  BP: 110/68  Pulse: 82  Temp: 97.9 F (36.6 C)  TempSrc: Oral  SpO2: 94%  Weight: 162 lb 6.4 oz (73.7 kg)  Height: 5\' 4"  (1.626 m)    SpO2: 94 % O2 Device: None (Room air)  Physical Exam: General: Well-appearing, no acute distress HENT: [place], AT Eyes: EOMI, no scleral icterus Respiratory: Clear to auscultation bilaterally.  No crackles, wheezing or rales Cardiovascular: RRR, -M/R/G, no JVD Extremities:-Edema,-tenderness Neuro: AAO x4, CNII-XII grossly intact Psych: Normal mood, normal affect  Data Reviewed:  Imaging: CTA 04/29/20 - No pulmonary emboli. Dependent bibasilar atelectasis. Diffuse emphysema with mosaic attenuation CT Chest 01/11/21 - Multifocal reticulonodular opacities  PFT: 08/10/20 FVC  2.61 (87%) FEV1 2.39 (105%) Ratio 87  TLC 88% DLCO 117% Interpretation: Normal spirometry. Increased DLCO suggestive of asthma No significant bronchodilator response  Labs: CBC    Component Value Date/Time   WBC 8.3 01/18/2021 1304   WBC 5.4 05/01/2020 0347   RBC 4.55 01/18/2021 1304   HGB 14.1 01/18/2021 1304   HCT 43.6 01/18/2021 1304   PLT 184 01/18/2021 1304   MCV 95.8 01/18/2021 1304   MCH 31.0 01/18/2021 1304   MCHC 32.3 01/18/2021 1304   RDW 14.9 01/18/2021 1304   LYMPHSABS 3.4 01/18/2021 1304   MONOABS 0.6 01/18/2021 1304   EOSABS 0.1 01/18/2021 1304   BASOSABS 0.0 01/18/2021 1304   Echocardiogram 11/27/20 EF 60-65%. No valvular or WMA  Assessment & Plan:   Discussion: 70 year old female never smoker with hx CAD s/p LAD stent, breast cancer s/p left sided radiation completed 04/2020 who presents for lung pain, cough and hoarseness. We reviewed recent CT concerning for infection. She has had chronic chest pain and chronic hoarseness that began prior to inhaler use. The cause of her lung/chest pain remains unclear that may be related to uncontrolled obstructive lung disease (emphysema +/- occupational asthma) and possible radiation pneumonitis.  She had previously improved in late summer and Advair was discontinued however she restarted Advair in November 2022 ago for return of symptoms. Subtherapeutic dosing however was previously ineffective when first tried on Advair. We discussed bronchodilator management and decided to start LAMA for symptom relief and add-on different ICS/LABA in the future.  Emphysema with possible exacerbation Radiation pneumonitis? Left-sided pneumonia  --START Augmentin for 7 days --CONTINUE Spiriva 2.5 mcg TWO puffs ONCE a day --CONTINUE Albuterol AS NEEDED for shortness of breath or wheezing.   Hoarseness --Ambulatory referral to ENT   Health Maintenance Immunization History  Administered Date(s) Administered   Influenza, High Dose  Seasonal PF 12/31/2016, 10/24/2017  Pneumococcal Conjugate-13 10/24/2017   Td 07/12/2005, 02/08/2019   CT Lung Screen - not indicated  Orders Placed This Encounter  Procedures   Ambulatory referral to ENT    Referral Priority:   Routine    Referral Type:   Consultation    Referral Reason:   Specialty Services Required    Requested Specialty:   Otolaryngology    Number of Visits Requested:   1   Meds ordered this encounter  Medications   amoxicillin-clavulanate (AUGMENTIN) 875-125 MG tablet    Sig: Take 1 tablet by mouth 2 (two) times daily for 7 days.    Dispense:  14 tablet    Refill:  0   loratadine (CLARITIN) 10 MG tablet    Sig: Take 1 tablet (10 mg total) by mouth daily as needed for allergies.    Dispense:  30 tablet    Refill:  11   Return in about 1 month (around 02/19/2021).  I have spent a total time of 33-minutes on the day of the appointment reviewing prior documentation, coordinating care and discussing medical diagnosis and plan with the patient/family. Past medical history, allergies, medications were reviewed. Pertinent imaging, labs and tests included in this note have been reviewed and interpreted independently by me.  Willis, MD [facility] 01/19/2021 12:20 PM  Office Number [phone]

## 2021-01-19 NOTE — Progress Notes (Signed)
[place] CSW Progress Note  Clinical Education officer, museum contacted patient by phone to follow-up on SDOH needs and coping.  Patient has secured a new apartment [employer]- affordable senior housing) and will be moving at the end of the month. She has utilized other assistance options available but no pressing needs at this time since she has found housing.  Pt has finished treatment and is happy about that progress. She has appts with other specialties to try and improve her breathing and headaches which she hopes will allow her to work again.  Pt was grateful for the check-in and may reach out to this CSW as needed.    Christeen Douglas , LCSW

## 2021-01-19 NOTE — Patient Instructions (Addendum)
Emphysema with possible exacerbation Radiation pneumonitis? Left-sided pneumonia on CT --START Augmentin for 7 days --CONTINUE Spiriva 2.5 mcg TWO puffs ONCE a day --CONTINUE Albuterol AS NEEDED for shortness of breath or wheezing.   Hoarseness --Ambulatory referral to ENT  Keep f/u with me in Feb or call sooner if needed

## 2021-01-25 ENCOUNTER — Encounter: Payer: Self-pay | Admitting: Pulmonary Disease

## 2021-01-25 DIAGNOSIS — R490 Dysphonia: Secondary | ICD-10-CM | POA: Insufficient documentation

## 2021-01-25 MED ORDER — LORATADINE 10 MG PO TABS: 10.0000 mg | ORAL_TABLET | Freq: Every day | ORAL | 11 refills | Status: DC | PRN

## 2021-01-26 ENCOUNTER — Telehealth: Payer: Self-pay | Admitting: Pulmonary Disease

## 2021-01-26 NOTE — Telephone Encounter (Signed)
Spoke with the pt  She states using spiriva as directed and finished abx and she is not feeling any better  She is no worse No fever  Breathing is at baseline today  She states "I can feel pain right where the bottom of my lungs are"  OV with JE next wk 01/31/21  Advised to seek emergent care sooner if needed

## 2021-01-31 ENCOUNTER — Other Ambulatory Visit: Payer: Self-pay

## 2021-01-31 ENCOUNTER — Ambulatory Visit (INDEPENDENT_AMBULATORY_CARE_PROVIDER_SITE_OTHER): Payer: Medicare Other | Admitting: Pulmonary Disease

## 2021-01-31 ENCOUNTER — Encounter: Payer: Self-pay | Admitting: Pulmonary Disease

## 2021-01-31 VITALS — BP 118/72 | HR 75 | Temp 98.0°F | Ht 64.0 in | Wt 165.4 lb

## 2021-01-31 DIAGNOSIS — J441 Chronic obstructive pulmonary disease with (acute) exacerbation: Secondary | ICD-10-CM

## 2021-01-31 DIAGNOSIS — R490 Dysphonia: Secondary | ICD-10-CM | POA: Diagnosis not present

## 2021-01-31 MED ORDER — FLUTICASONE FUROATE-VILANTEROL 100-25 MCG/ACT IN AEPB: 1.0000 | INHALATION_SPRAY | Freq: Every day | RESPIRATORY_TRACT | 0 refills | Status: DC

## 2021-01-31 MED ORDER — FLUTICASONE FUROATE-VILANTEROL 200-25 MCG/ACT IN AEPB: 1.0000 | INHALATION_SPRAY | Freq: Every day | RESPIRATORY_TRACT | 5 refills | Status: DC

## 2021-01-31 MED ORDER — PREDNISONE 10 MG PO TABS: ORAL_TABLET | ORAL | 0 refills | Status: AC

## 2021-01-31 NOTE — Patient Instructions (Addendum)
Emphysema with exacerbation Radiation pneumonitis? --START prednisone taper (40, 30, 20, 10) --START Breo 200-25 mcg ONE puff ONCE a day. --CONTINUE Spiriva 2.5 mcg TWO puffs ONCE a day --CONTINUE Albuterol AS NEEDED for shortness of breath or wheezing.   Hoarseness --RE-REFER referral to ENT  Keep follow-up with me in Feb

## 2021-01-31 NOTE — Progress Notes (Signed)
Subjective:   PATIENT ID: Felicia [name] GENDER: female DOB: 1950-05-15, MRN: [id number]   HPI  Chief Complaint  Patient presents with   Follow-up    2wk f/u Pneumonia     Reason for Visit: Asthma follow-up  [name] is a 71 year old female never smoker with left breast cancer s/p radiation, CAD, DM and HTN who presents for follow-up  After completing one month after radiation, she reports having left sided chest pain/pressure with radiation to the the back associated with shortness of breath. She feels she cannot get a full breath and can be tight. Associated with dry cough. Denies chest congestion. Most nights a week she has discomfort related to this pain that will awaken. Denies bilateral chest pain. Talking will get her winded.  Denies any respiratory symptoms prior to radiation symptoms. Since treatment she has a difficult time to catching her breath.   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  12/27/20 She restarted on Advair daily for cough with occasional thick clear sputum and chest tightness. Denies wheezing. Does not feel like the inhaler is working. Denies fevers. Associated back or "lung" pain where she was radiated that seems right-sided but can be bilateral at times.   01/19/21 She is still having chest pain/tightness that is worse at night. Reports nasal congestion and coughing. It feels like "lung" pain that is bilateral. She has been more hoarse with sore throat that began prior to inhaler use. She loses her voice with talking. She reports in 2017 needing a Speech therapist after a TIA.  01/31/21 Since our last visit, she reports she is still having persistent lung and chest pain after finishing antibiotics. Has been compliant with inhalers  including Spiriva. Five days ago she is starting having more wheezing with the cold air. Uses her albuterol inhaler once a week.  Social History: Second hand smoke exposure Reports she was born two months early  Past Medical History:  Diagnosis Date   Anemia    Anxiety    Breast cancer ([place]) 09/2019   left breast IMC   CAD (coronary artery disease)    a. 3/2007s/p DES to the LAD [facility]); b. 01/2017 MV: EF 80%, small, mild apical ant defect w/o ischemia (felt to be breast atten). Low risk.   Depression    DM (diabetes mellitus) ([employer])    Essential hypertension    GERD (gastroesophageal reflux disease)    Headache    secondary to neck surgery per patient   High triglycerides    Hyperlipidemia    Hypothyroid    OA (osteoarthritis)    right knee,hands   Overflow incontinence    PONV (postoperative nausea and vomiting)    s/p gallbladder    RLS (restless legs syndrome)    Urinary urgency    Uterine cancer ([place]) dx'd 2014     Family History  Problem Relation Age of Onset   AAA (abdominal aortic aneurysm) Mother    Diabetes Father    Alzheimer's disease Father    Throat cancer Sister    Breast cancer Cousin      Social History   Occupational History   Not on file  Tobacco Use   Smoking status: Never   Smokeless tobacco: Never  Vaping  Use   Vaping Use: Never used  Substance and Sexual Activity   Alcohol use: Yes    Comment: OCCASIONALLY   Drug use: No   Sexual activity: Not Currently    Birth control/protection: Surgical    Comment: hyst    Allergies  Allergen Reactions   Chloraprep One Step [Chlorhexidine Gluconate] Rash   Estrogens Other (See Comments)    PATIENT HAS A HISTORY OF CANCER AND HAS BEEN TOLD TO NEVER TAKE ANYTHING CONTAINING ESTROGEN, AS IT MIGHT CAUSE A RECURRENCE   Shrimp [Shellfish Allergy] Nausea And Vomiting     Outpatient Medications Prior to Visit  Medication Sig Dispense Refill   ACCU-CHEK GUIDE test strip 1 each by Other  route as needed.     albuterol (VENTOLIN HFA) 108 (90 Base) MCG/ACT inhaler Inhale 2 puffs into the lungs every 6 (six) hours as needed for wheezing or shortness of breath. 8 g 2   anastrozole (ARIMIDEX) 1 MG tablet Take 1 tablet (1 mg total) by mouth daily. 90 tablet 4   Ascorbic Acid (VITAMIN C PO) Take 1 tablet by mouth daily.     aspirin EC 81 MG tablet Take 81 mg by mouth daily.     atorvastatin (LIPITOR) 40 MG tablet TAKE 1 TABLET BY MOUTH EVERY DAY 90 tablet 2   bisoprolol (ZEBETA) 5 MG tablet TAKE 1 TABLET BY MOUTH EVERY DAY 90 tablet 2   Calcium Citrate-Vitamin D (CALCIUM + D PO) Take 1 tablet by mouth daily with supper.     CINNAMON PO Take 1 capsule by mouth daily with supper.     diclofenac sodium (VOLTAREN) 1 % GEL Apply 2-4 g topically 4 (four) times daily as needed (as directed for pain).     escitalopram (LEXAPRO) 10 MG tablet Take 1 tablet (10 mg total) by mouth at bedtime. 90 tablet 1   Ferrous Sulfate (IRON PO) Take 1 tablet by mouth daily. alternates days:1 tablet one day and 2 tablets the next day     gabapentin (NEURONTIN) 300 MG capsule Take 1 capsule (300 mg total) by mouth at bedtime. 90 capsule 4   GLIPIZIDE XL 10 MG 24 hr tablet Take 2 tablets (20 mg total) by mouth daily. 90 tablet 3   JARDIANCE 25 MG TABS tablet Take 1 tablet (25 mg total) by mouth every morning. 90 tablet 1   levothyroxine (SYNTHROID) 137 MCG tablet 1 TABLET IN THE MORNING ON AN EMPTY STOMACH ORALLY ONCE A DAY 90 90 tablet 1   loratadine (CLARITIN) 10 MG tablet Take 1 tablet (10 mg total) by mouth daily as needed for allergies. 30 tablet 11   metFORMIN (GLUCOPHAGE) 500 MG tablet Take 1 tablet (500 mg total) by mouth as directed. Patient takes 1 tablet in AM and 2 tablets in PM 270 tablet 0   metroNIDAZOLE (METROGEL) 1 % gel Apply 1 application topically as needed (as directed to affected area). 45 g 2   Multiple Vitamins-Calcium (ONE-A-DAY WOMENS PO) Take 1 tablet by mouth daily.     nitroGLYCERIN  (NITROSTAT) 0.4 MG SL tablet Place 1 tablet (0.4 mg total) under the tongue every 5 (five) minutes as needed for chest pain. 25 tablet 6   omeprazole (PRILOSEC) 40 MG capsule Take 40 mg by mouth daily with supper.      prochlorperazine (COMPAZINE) 10 MG tablet TAKE 1 TABLET BY MOUTH BEFORE MEALS AND AT BEDTIME ON DAYS 2 AND 3 AFTER CHEMOTHERAPY (COUNTING CHEMOTHERAPY DAY AS DAY 1) AFTER THAT MAY  TAKE AS NEEDED 30 tablet 1   rOPINIRole (REQUIP) 0.5 MG tablet 1 TABLET 1 TO 3 HOURS BEFORE BEDTIME ORALLY ONCE A DAY 90 DAYS 90 tablet 0   Tiotropium Bromide Monohydrate (SPIRIVA RESPIMAT) 2.5 MCG/ACT AERS Inhale 2 puffs into the lungs daily. 4 g 5   Tiotropium Bromide Monohydrate (SPIRIVA RESPIMAT) 2.5 MCG/ACT AERS Inhale 2 puffs into the lungs daily. 4 g 0   tiZANidine (ZANAFLEX) 4 MG tablet Take 4 mg by mouth every 8 (eight) hours as needed for muscle spasms. As needed     traMADol (ULTRAM) 50 MG tablet Take 1 tablet (50 mg total) by mouth every 6 (six) hours as needed for moderate pain. 30 tablet 0   TRULICITY 1.5 EP/3.2RJ SOPN INJECT 1.5 MG INTO THE SKIN ONCE A WEEK. 2 mL 2   TURMERIC PO Take 1 capsule by mouth daily with supper.     No facility-administered medications prior to visit.    Review of Systems  Constitutional:  Negative for chills, diaphoresis, fever, malaise/fatigue and weight loss.  HENT:  Negative for congestion.   Respiratory:  Positive for shortness of breath and wheezing. Negative for cough, hemoptysis and sputum production.   Cardiovascular:  Positive for chest pain. Negative for palpitations and leg swelling.    Objective:   Vitals:   01/31/21 1201  BP: 118/72  Pulse: 75  Temp: 98 F (36.7 C)  TempSrc: Oral  SpO2: 96%  Weight: 165 lb 6.4 oz (75 kg)  Height: 5\' 4"  (1.626 m)    SpO2: 96 % O2 Device: None (Room air)  Physical Exam: General: Well-appearing, no acute distress HENT: [place], AT Eyes: EOMI, no scleral icterus Respiratory: Clear to auscultation  bilaterally.  No crackles, wheezing or rales Cardiovascular: RRR, -M/R/G, no JVD Extremities:-Edema,-tenderness Neuro: AAO x4, CNII-XII grossly intact Psych: Normal mood, normal affect  Data Reviewed:  Imaging: CTA 04/29/20 - No pulmonary emboli. Dependent bibasilar atelectasis. Diffuse emphysema with mosaic attenuation CT Chest 01/11/21 - Multifocal reticulonodular opacities  PFT: 08/10/20 FVC 2.61 (87%) FEV1 2.39 (105%) Ratio 87  TLC 88% DLCO 117% Interpretation: Normal spirometry. Increased DLCO suggestive of asthma No significant bronchodilator response  Labs: CBC    Component Value Date/Time   WBC 8.3 01/18/2021 1304   WBC 5.4 05/01/2020 0347   RBC 4.55 01/18/2021 1304   HGB 14.1 01/18/2021 1304   HCT 43.6 01/18/2021 1304   PLT 184 01/18/2021 1304   MCV 95.8 01/18/2021 1304   MCH 31.0 01/18/2021 1304   MCHC 32.3 01/18/2021 1304   RDW 14.9 01/18/2021 1304   LYMPHSABS 3.4 01/18/2021 1304   MONOABS 0.6 01/18/2021 1304   EOSABS 0.1 01/18/2021 1304   BASOSABS 0.0 01/18/2021 1304   Echocardiogram 11/27/20 EF 60-65%. No valvular or WMA  Assessment & Plan:   Discussion: 71 year old female never smoker with hx CAD s/p stent to LAD, hx breast cancer s/p left sided radiation completed 04/2020 who presents for follow-up. She continues to have chronic chest/lung pain with unknown etiology. May be related to uncontrolled obstructive lung disease (emphysema +/- occupational asthma) and possible radiation pneumonitis. She had period of time where symptoms improved in the summer where Advair was even discontinued. However Advair was restarted in November 2022 for current symptoms. We initially started LAMA for relief but since her symptoms persist we will try alternative ICS/LABA.  Emphysema with exacerbation Radiation pneumonitis? --START prednisone taper (40, 30, 20, 10) --START Breo 200-25 mcg ONE puff ONCE a day. --CONTINUE Spiriva 2.5  mcg TWO puffs ONCE a day --CONTINUE  Albuterol AS NEEDED for shortness of breath or wheezing.   Hoarseness --RE-REFER referral to ENT  Health Maintenance Immunization History  Administered Date(s) Administered   Influenza, High Dose Seasonal PF 12/31/2016, 10/24/2017   Pneumococcal Conjugate-13 10/24/2017   Td 07/12/2005, 02/08/2019   CT Lung Screen - not indicated  Orders Placed This Encounter  Procedures   Ambulatory referral to ENT    Referral Priority:   Routine    Referral Type:   Consultation    Referral Reason:   Specialty Services Required    Requested Specialty:   Otolaryngology    Number of Visits Requested:   1   Meds ordered this encounter  Medications   predniSONE (DELTASONE) 10 MG tablet    Sig: Take 4 tablets (40 mg total) by mouth daily with breakfast for 2 days, THEN 3 tablets (30 mg total) daily with breakfast for 2 days, THEN 2 tablets (20 mg total) daily with breakfast for 2 days, THEN 1 tablet (10 mg total) daily with breakfast for 2 days.    Dispense:  20 tablet    Refill:  0   fluticasone furoate-vilanterol (BREO ELLIPTA) 200-25 MCG/ACT AEPB    Sig: Inhale 1 puff into the lungs daily.    Dispense:  1 each    Refill:  5   fluticasone furoate-vilanterol (BREO ELLIPTA) 100-25 MCG/ACT AEPB    Sig: Inhale 1 puff into the lungs daily.    Dispense:  28 each    Refill:  0    Order Specific Question:   Manufacturer?    Answer:   [employer] [[id number]]   No follow-ups on file. Feb 2023  I have spent a total time of 32-minutes on the day of the appointment reviewing prior documentation, coordinating care and discussing medical diagnosis and plan with the patient/family. Past medical history, allergies, medications were reviewed. Pertinent imaging, labs and tests included in this note have been reviewed and interpreted independently by me.  [place], MD [facility] 01/31/2021 12:30 PM  Office Number [phone]

## 2021-02-05 ENCOUNTER — Other Ambulatory Visit: Payer: Self-pay

## 2021-02-05 ENCOUNTER — Other Ambulatory Visit: Payer: Self-pay | Admitting: Adult Health

## 2021-02-05 ENCOUNTER — Ambulatory Visit (INDEPENDENT_AMBULATORY_CARE_PROVIDER_SITE_OTHER): Payer: Medicare Other | Admitting: Physician Assistant

## 2021-02-05 ENCOUNTER — Encounter: Payer: Self-pay | Admitting: Physician Assistant

## 2021-02-05 VITALS — BP 128/76 | HR 67 | Temp 98.1°F | Wt 159.4 lb

## 2021-02-05 DIAGNOSIS — Z170 Estrogen receptor positive status [ER+]: Secondary | ICD-10-CM

## 2021-02-05 DIAGNOSIS — C50412 Malignant neoplasm of upper-outer quadrant of left female breast: Secondary | ICD-10-CM

## 2021-02-05 DIAGNOSIS — D508 Other iron deficiency anemias: Secondary | ICD-10-CM | POA: Diagnosis not present

## 2021-02-05 DIAGNOSIS — E538 Deficiency of other specified B group vitamins: Secondary | ICD-10-CM

## 2021-02-05 DIAGNOSIS — S9032XA Contusion of left foot, initial encounter: Secondary | ICD-10-CM

## 2021-02-05 DIAGNOSIS — E038 Other specified hypothyroidism: Secondary | ICD-10-CM | POA: Diagnosis not present

## 2021-02-05 DIAGNOSIS — E1165 Type 2 diabetes mellitus with hyperglycemia: Secondary | ICD-10-CM | POA: Diagnosis not present

## 2021-02-05 DIAGNOSIS — R079 Chest pain, unspecified: Secondary | ICD-10-CM

## 2021-02-05 LAB — HEMOGLOBIN A1C: Hgb A1c MFr Bld: 9.1 % — ABNORMAL HIGH (ref 4.6–6.5)

## 2021-02-05 LAB — IRON,TIBC AND FERRITIN PANEL
%SAT: 26 % (calc) (ref 16–45)
Ferritin: 40 ng/mL (ref 16–288)
Iron: 100 ug/dL (ref 45–160)
TIBC: 379 mcg/dL (calc) (ref 250–450)

## 2021-02-05 LAB — TSH: TSH: 0.37 u[IU]/mL (ref 0.35–5.50)

## 2021-02-05 LAB — VITAMIN B12: Vitamin B-12: >1504 pg/mL — ABNORMAL HIGH (ref 211–911)

## 2021-02-05 MED ORDER — ESCITALOPRAM OXALATE 10 MG PO TABS: 10.0000 mg | ORAL_TABLET | Freq: Every day | ORAL | 1 refills | Status: DC

## 2021-02-05 MED ORDER — LEVOTHYROXINE SODIUM 137 MCG PO TABS: ORAL_TABLET | ORAL | 1 refills | Status: DC

## 2021-02-05 MED ORDER — OMEPRAZOLE 40 MG PO CPDR: 40.0000 mg | DELAYED_RELEASE_CAPSULE | Freq: Every day | ORAL | 1 refills | Status: DC

## 2021-02-05 MED ORDER — METFORMIN HCL 500 MG PO TABS: 500.0000 mg | ORAL_TABLET | Freq: Two times a day (BID) | ORAL | 1 refills | Status: DC

## 2021-02-05 MED ORDER — ROPINIROLE HCL 0.5 MG PO TABS: ORAL_TABLET | ORAL | 0 refills | Status: DC

## 2021-02-05 MED ORDER — GLIPIZIDE XL 10 MG PO TB24: 20.0000 mg | ORAL_TABLET | Freq: Every day | ORAL | 3 refills | Status: DC

## 2021-02-05 MED ORDER — JARDIANCE 25 MG PO TABS: 25.0000 mg | ORAL_TABLET | Freq: Every morning | ORAL | 1 refills | Status: AC

## 2021-02-05 NOTE — Progress Notes (Signed)
Mri ordered per last note

## 2021-02-05 NOTE — Patient Instructions (Addendum)
Good to see you today!  Please go to the lab for blood work and I will send results through [place]. You should be contacted about MRI brain scheduling - I see order was placed.  Let me know if your foot does not improve and you change your mind about an xray.  Medications refilled.  See you back in 3 months or sooner if any concerns.

## 2021-02-05 NOTE — Progress Notes (Signed)
Subjective:    Patient ID: Felicia [name], female    DOB: 1950-06-07, 71 y.o.   MRN: [id number]   HPI Patient is in today for chronic recheck. See A/P for details.   Past Medical History:  Diagnosis Date   Anemia    Anxiety    Breast cancer ([place]) 09/2019   left breast IMC   CAD (coronary artery disease)    a. 3/2007s/p DES to the LAD [facility]); b. 01/2017 MV: EF 80%, small, mild apical ant defect w/o ischemia (felt to be breast atten). Low risk.   Depression    DM (diabetes mellitus) ([place])    Essential hypertension    GERD (gastroesophageal reflux disease)    Headache    secondary to neck surgery per patient   High triglycerides    Hyperlipidemia    Hypothyroid    OA (osteoarthritis)    right knee,hands   Overflow incontinence    PONV (postoperative nausea and vomiting)    s/p gallbladder    RLS (restless legs syndrome)    Urinary urgency    Uterine cancer ([place]) dx'd 2014    Past Surgical History:  Procedure Laterality Date   BREAST LUMPECTOMY WITH RADIOACTIVE SEED AND SENTINEL LYMPH NODE BIOPSY Left 10/07/2019   Procedure: LEFT BREAST LUMPECTOMY WITH RADIOACTIVE SEED AND SENTINEL LYMPH NODE BIOPSY;  Surgeon: Rolm Bookbinder, MD;  Location: [place];  Service: General;  Laterality: Left;   CORONARY STENT PLACEMENT     CORONARY/GRAFT ACUTE MI REVASCULARIZATION     FINGER ARTHRODESIS Right 06/11/2019   Procedure: ARTHRODESIS INDEX FINGER DISTAL PHALANGEAL JOINT;  Surgeon: [name], MD;  Location: [place];  Service: Orthopedics;  Laterality: Right;   FOOT SURGERY Left    LEFT HEART CATH AND CORONARY ANGIOGRAPHY N/A 02/16/2018   Procedure: LEFT HEART CATH AND CORONARY ANGIOGRAPHY;  Surgeon: Lorretta Harp, MD;  Location: [place] [facility];  Service: Cardiovascular;  Laterality: N/A;   LEFT HEART CATH AND CORONARY ANGIOGRAPHY N/A 05/01/2020   Procedure: LEFT HEART CATH AND CORONARY ANGIOGRAPHY;  Surgeon: Lorretta Harp, MD;   Location: [place] [facility];  Service: Cardiovascular;  Laterality: N/A;   PORTACATH PLACEMENT Right 11/23/2019   Procedure: INSERTION PORT-A-CATH WITH ULTRASOUND GUIDANCE;  Surgeon: Rolm Bookbinder, MD;  Location: [place];  Service: General;  Laterality: Right;   REPLACEMENT TOTAL KNEE Right    SPINE SURGERY     TOTAL ABDOMINAL HYSTERECTOMY     FOR UTERUS CANCER   TOTAL HIP ARTHROPLASTY Left     Family History  Problem Relation Age of Onset   AAA (abdominal aortic aneurysm) Mother    Diabetes Father    Alzheimer's disease Father    Throat cancer Sister    Breast cancer Cousin     Social History   Tobacco Use   Smoking status: Never   Smokeless tobacco: Never  Vaping Use   Vaping Use: Never used  Substance Use Topics   Alcohol use: Yes    Comment: OCCASIONALLY   Drug use: No     Allergies  Allergen Reactions   Chloraprep One Step [Chlorhexidine Gluconate] Rash   Estrogens Other (See Comments)    PATIENT HAS A HISTORY OF CANCER AND HAS BEEN TOLD TO NEVER TAKE ANYTHING CONTAINING ESTROGEN, AS IT MIGHT CAUSE A RECURRENCE   Shrimp [Shellfish Allergy] Nausea And Vomiting    Review of Systems NEGATIVE UNLESS OTHERWISE INDICATED IN HPI      Objective:  BP 128/76    Pulse 67    Temp 98.1 F (36.7 C)    Wt 159 lb 6.4 oz (72.3 kg)    SpO2 97%    BMI 27.36 kg/m   Wt Readings from Last 3 Encounters:  02/05/21 159 lb 6.4 oz (72.3 kg)  01/31/21 165 lb 6.4 oz (75 kg)  01/19/21 162 lb 6.4 oz (73.7 kg)    BP Readings from Last 3 Encounters:  02/05/21 128/76  01/31/21 118/72  01/19/21 110/68     Physical Exam Vitals and nursing note reviewed.  Constitutional:      Appearance: Normal appearance. She is normal weight. She is not toxic-appearing.  HENT:     Head: Normocephalic and atraumatic.     Right Ear: External ear normal.     Left Ear: External ear normal.     Nose: Nose normal.     Mouth/Throat:     Mouth: Mucous membranes are moist.   Eyes:     Extraocular Movements: Extraocular movements intact.     Conjunctiva/sclera: Conjunctivae normal.     Pupils: Pupils are equal, round, and reactive to light.  Cardiovascular:     Rate and Rhythm: Normal rate and regular rhythm.     Pulses: Normal pulses.     Heart sounds: Normal heart sounds.  Pulmonary:     Effort: Pulmonary effort is normal.     Breath sounds: Normal breath sounds.  Musculoskeletal:        General: Normal range of motion.     Cervical back: Normal range of motion and neck supple.  Skin:    General: Skin is warm and dry.     Findings: Bruising (dorsum left foot) present.  Neurological:     General: No focal deficit present.     Mental Status: She is alert and oriented to person, place, and time.  Psychiatric:        Mood and Affect: Mood normal.        Behavior: Behavior normal.        Thought Content: Thought content normal.        Judgment: Judgment normal.       Assessment & Plan:   Problem List Items Addressed This Visit       Endocrine   Hypothyroid   Relevant Orders   TSH (Completed)   Hyperglycemia due to type 2 diabetes mellitus ([place]) - Primary   Relevant Orders   Hemoglobin A1c (Completed)     Other   Malignant neoplasm of upper-outer quadrant of left breast in female, estrogen receptor positive ([place])   Other Visit Diagnoses     Other iron deficiency anemia       Relevant Orders   Iron, TIBC and Ferritin Panel (Completed)   Contusion of left foot, initial encounter       B12 deficiency       Relevant Orders   Vitamin B12 (Completed)       1. Type 2 diabetes mellitus with hyperglycemia, without long-term current use of insulin ([place]) -Recheck Ha1c today -Pt currently taking Trulicity 1.5 mg/week; Glipizide XL 20 mg daily; Jardiance 25 mg daily; Metformin 500 mg 1 tab qAM and 2 tab qPM -Encouraged to continue to work on dietary changes -Needs ophthalmology, foot, and urine microalbumin updated- will check these at next  appointment  2. Malignant neoplasm of upper-outer quadrant of left breast in female, estrogen receptor positive ([place]) -I reviewed her most recent note from [facility]; pt  to f/up in 6 months there  3. Other iron deficiency anemia -Pt states she has a hx and has been taking ferrous sulfate daily. Last CBC showed normal hemoglobin, but pt requested iron to be checked. Will draw, iron, TIBC, ferritin today  Lab Results  Component Value Date   WBC 8.3 01/18/2021   HGB 14.1 01/18/2021   HCT 43.6 01/18/2021   MCV 95.8 01/18/2021   PLT 184 01/18/2021    4. Other specified hypothyroidism -Will check TSH -Currently on Levothyroxine 137 mcg  5. Contusion of left foot, initial encounter -Pt dropped a can on her left foot a few days ago, diffusely bruised across dorsum of foot. Offered XRAY, pt declined at this time. She is ambulating without pain she says.  6. B12 deficiency -Pt also requested B12 to be checked, stating she has a hx of deficiency    Bobette Leyh M Hennesy Sobalvarro, PA-C

## 2021-02-06 ENCOUNTER — Encounter: Payer: Self-pay | Admitting: Pulmonary Disease

## 2021-02-14 ENCOUNTER — Other Ambulatory Visit: Payer: Self-pay

## 2021-02-14 ENCOUNTER — Ambulatory Visit (HOSPITAL_COMMUNITY)
Admission: RE | Admit: 2021-02-14 | Discharge: 2021-02-14 | Disposition: A | Discharge status: Home / Self Care | Payer: Medicare Other | Source: Ambulatory Visit | Attending: Adult Health | Admitting: Adult Health

## 2021-02-14 DIAGNOSIS — Z170 Estrogen receptor positive status [ER+]: Secondary | ICD-10-CM | POA: Insufficient documentation

## 2021-02-14 DIAGNOSIS — C50412 Malignant neoplasm of upper-outer quadrant of left female breast: Secondary | ICD-10-CM | POA: Insufficient documentation

## 2021-02-14 MED ORDER — GADOBUTROL 1 MMOL/ML IV SOLN
7.0000 mL | Freq: Once | INTRAVENOUS | Status: AC | PRN
  Administered 2021-02-14: 7 mL via INTRAVENOUS

## 2021-02-15 ENCOUNTER — Telehealth: Payer: Self-pay

## 2021-02-15 NOTE — Telephone Encounter (Signed)
-----   Message from Gardenia Phlegm, NP sent at 02/15/2021  2:16 PM EST ----- Negative, please notify patient of results  ----- Message ----- From: Interface, Rad Results In Sent: 02/15/2021  10:23 AM EST To: Gardenia Phlegm, NP

## 2021-02-15 NOTE — Telephone Encounter (Signed)
Attempt x3 to call pt, brain mri negative, no VM available

## 2021-02-22 DIAGNOSIS — R0982 Postnasal drip: Secondary | ICD-10-CM | POA: Insufficient documentation

## 2021-02-22 DIAGNOSIS — H6123 Impacted cerumen, bilateral: Secondary | ICD-10-CM | POA: Insufficient documentation

## 2021-02-22 DIAGNOSIS — J3489 Other specified disorders of nose and nasal sinuses: Secondary | ICD-10-CM | POA: Insufficient documentation

## 2021-02-28 ENCOUNTER — Ambulatory Visit: Payer: Medicare Other | Admitting: Pulmonary Disease

## 2021-03-01 ENCOUNTER — Other Ambulatory Visit: Payer: Self-pay

## 2021-03-01 ENCOUNTER — Inpatient Hospital Stay: Payer: Medicare Other | Attending: Oncology

## 2021-03-01 DIAGNOSIS — C50412 Malignant neoplasm of upper-outer quadrant of left female breast: Secondary | ICD-10-CM | POA: Insufficient documentation

## 2021-03-01 DIAGNOSIS — Z95828 Presence of other vascular implants and grafts: Secondary | ICD-10-CM

## 2021-03-01 DIAGNOSIS — Z452 Encounter for adjustment and management of vascular access device: Secondary | ICD-10-CM | POA: Insufficient documentation

## 2021-03-01 DIAGNOSIS — Z170 Estrogen receptor positive status [ER+]: Secondary | ICD-10-CM | POA: Insufficient documentation

## 2021-03-01 MED ORDER — SODIUM CHLORIDE 0.9% FLUSH
10.0000 mL | Freq: Once | INTRAVENOUS | Status: AC
  Administered 2021-03-01: 10 mL

## 2021-03-01 MED ORDER — HEPARIN SOD (PORK) LOCK FLUSH 100 UNIT/ML IV SOLN
500.0000 [IU] | Freq: Once | INTRAVENOUS | Status: AC
  Administered 2021-03-01: 500 [IU]

## 2021-03-01 NOTE — Progress Notes (Signed)
Per Thedore Mins NP, pt only needs port flush and no labs today.

## 2021-03-16 ENCOUNTER — Other Ambulatory Visit: Payer: Self-pay | Admitting: Physician Assistant

## 2021-03-19 ENCOUNTER — Encounter: Payer: Self-pay | Admitting: Physician Assistant

## 2021-03-20 ENCOUNTER — Other Ambulatory Visit: Payer: Self-pay

## 2021-03-20 ENCOUNTER — Encounter: Payer: Self-pay | Admitting: Pulmonary Disease

## 2021-03-20 ENCOUNTER — Ambulatory Visit: Payer: Medicare Other | Admitting: Pulmonary Disease

## 2021-03-20 VITALS — BP 128/76 | HR 84 | Wt 167.0 lb

## 2021-03-20 DIAGNOSIS — J432 Centrilobular emphysema: Secondary | ICD-10-CM | POA: Diagnosis not present

## 2021-03-20 NOTE — Patient Instructions (Addendum)
Emphysema  ?--CONTINUE Breo 200-25 mcg ONE puff ONCE a day. ?--CONTINUE Spiriva 2.5 mcg TWO puffs ONCE a day ?--CONTINUE Albuterol AS NEEDED for shortness of breath or wheezing ? ?Hoarseness - improved ?--CONTINUE humidifier, saline spray and reflux meds ? ?Follow-up with me in 6 months ?

## 2021-03-20 NOTE — Progress Notes (Signed)
Subjective:   PATIENT ID: Felicia [name] GENDER: female DOB: [birth year], MRN: [id number]   HPI  Chief Complaint  Patient presents with   Follow-up    sob   Reason for Visit: Asthma follow-up  [name] is a 71 year old female never smoker with left breast cancer s/p radiation, CAD, DM and HTN who presents for follow-up  After completing one month after radiation, she reports having left sided chest pain/pressure with radiation to the the back associated with shortness of breath. She feels she cannot get a full breath and can be tight. Associated with dry cough. Denies chest congestion. Most nights a week she has discomfort related to this pain that will awaken. Denies bilateral chest pain. Talking will get her winded.  Denies any respiratory symptoms prior to radiation symptoms. Since treatment she has a difficult time to catching her breath.   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  12/27/20 She restarted on Advair daily for cough with occasional thick clear sputum and chest tightness. Denies wheezing. Does not feel like the inhaler is working. Denies fevers. Associated back or "lung" pain where she was radiated that seems right-sided but can be bilateral at times.   01/19/21 She is still having chest pain/tightness that is worse at night. Reports nasal congestion and coughing. It feels like "lung" pain that is bilateral. She has been more hoarse with sore throat that began prior to inhaler use. She loses her voice with talking. She reports in 2017 needing a Speech therapist after a TIA.  01/31/21 Since our last visit, she reports she is still having persistent lung and chest pain after finishing antibiotics. Has been compliant with inhalers including Spiriva.  Five days ago she is starting having more wheezing with the cold air. Uses her albuterol inhaler once a week.  03/20/21 Since our last visit, she was started on Breo in addition to [place]. She reports she is feeling better. This is her allergy season and taking meds for this. She reports her shortness of breath and chest pain has improved by 90%. She has been seen by ENT in Feb 2023 for her hoarseness. Exam demonstrated mucous otherwise normal. Advised started saline and humidifier which patient reports has improved.  Social History: Second hand smoke exposure Reports she was born two months early  Past Medical History:  Diagnosis Date   Anemia    Anxiety    Breast cancer ([place]) 09/2019   left breast IMC   CAD (coronary artery disease)    a. 3/2007s/p DES to the LAD [facility]); b. 01/2017 MV: EF 80%, small, mild apical ant defect w/o ischemia (felt to be breast atten). Low risk.   Depression    DM (diabetes mellitus) ([place])    Essential hypertension    GERD (gastroesophageal reflux disease)    Headache    secondary to neck surgery per patient   High triglycerides    Hyperlipidemia    Hypothyroid    OA (osteoarthritis)    right knee,hands   Overflow incontinence    PONV (postoperative nausea and vomiting)    s/p gallbladder    RLS (restless legs syndrome)    Urinary urgency    Uterine cancer ([place]) dx'd 2014     Family History  Problem Relation Age of Onset   AAA (abdominal aortic aneurysm) Mother    Diabetes Father    Alzheimer's disease Father    Throat cancer Sister    Breast cancer Cousin      Social History   Occupational History   Not on file  Tobacco Use   Smoking status: Never   Smokeless tobacco: Never  Vaping Use   Vaping Use: Never used  Substance and Sexual Activity   Alcohol use: Yes    Comment: OCCASIONALLY   Drug use: No   Sexual activity: Not Currently    Birth control/protection: Surgical    Comment: hyst    Allergies  Allergen Reactions    Chloraprep One Step [Chlorhexidine Gluconate] Rash   Estrogens Other (See Comments)    PATIENT HAS A HISTORY OF CANCER AND HAS BEEN TOLD TO NEVER TAKE ANYTHING CONTAINING ESTROGEN, AS IT MIGHT CAUSE A RECURRENCE   Shrimp [Shellfish Allergy] Nausea And Vomiting     Outpatient Medications Prior to Visit  Medication Sig Dispense Refill   ACCU-CHEK GUIDE test strip 1 each by Other route as needed.     albuterol (VENTOLIN HFA) 108 (90 Base) MCG/ACT inhaler Inhale 2 puffs into the lungs every 6 (six) hours as needed for wheezing or shortness of breath. 8 g 2   anastrozole (ARIMIDEX) 1 MG tablet Take 1 tablet (1 mg total) by mouth daily. 90 tablet 4   Ascorbic Acid (VITAMIN C PO) Take 1 tablet by mouth daily.     aspirin EC 81 MG tablet Take 81 mg by mouth daily.     atorvastatin (LIPITOR) 40 MG tablet TAKE 1 TABLET BY MOUTH EVERY DAY 90 tablet 2   bisoprolol (ZEBETA) 5 MG tablet TAKE 1 TABLET BY MOUTH EVERY DAY 90 tablet 2   Calcium Citrate-Vitamin D (CALCIUM + D PO) Take 1 tablet by mouth daily with supper.     CINNAMON PO Take 1 capsule by mouth daily with supper.     diclofenac sodium (VOLTAREN) 1 % GEL Apply 2-4 g topically 4 (four) times daily as needed (as directed for pain).     escitalopram (LEXAPRO) 10 MG tablet Take 1 tablet (10 mg total) by mouth at bedtime. 90 tablet 1   Ferrous Sulfate (IRON PO) Take 1 tablet by mouth daily. alternates days:1 tablet one day and 2 tablets the next day     fluticasone furoate-vilanterol (BREO ELLIPTA) 100-25 MCG/ACT AEPB Inhale 1 puff into the lungs daily. 28 each 0   fluticasone furoate-vilanterol (BREO ELLIPTA) 200-25 MCG/ACT AEPB Inhale 1 puff into the lungs daily. 1 each 5   gabapentin (NEURONTIN) 300 MG capsule Take 1 capsule (300 mg total) by mouth at bedtime. 90 capsule 4   GLIPIZIDE XL 10 MG 24 hr tablet Take 2 tablets (20 mg total) by mouth daily. 90 tablet 3   JARDIANCE 25 MG TABS tablet Take 1 tablet (25 mg total) by mouth every morning. 90  tablet 1   levothyroxine (SYNTHROID) 137 MCG tablet 1 TABLET IN THE MORNING ON AN EMPTY STOMACH ORALLY ONCE A DAY 90 90 tablet 1   loratadine (CLARITIN) 10 MG tablet Take 1 tablet (10 mg total) by mouth daily as needed for allergies. 30 tablet 11   metFORMIN (GLUCOPHAGE) 500 MG tablet Take 1 tablet (500 mg total) by mouth 2 (two) times daily. Patient takes 1 tablet in AM and 2 tablets in PM 270 tablet 1   metroNIDAZOLE (METROGEL) 1 % gel Apply 1 application  topically as needed (as directed to affected area). 45 g 2   Multiple Vitamins-Calcium (ONE-A-DAY WOMENS PO) Take 1 tablet by mouth daily.     nitroGLYCERIN (NITROSTAT) 0.4 MG SL tablet Place 1 tablet (0.4 mg total) under the tongue every 5 (five) minutes as needed for chest pain. 25 tablet 6   omeprazole (PRILOSEC) 40 MG capsule Take 1 capsule (40 mg total) by mouth daily with supper. 90 capsule 1   prochlorperazine (COMPAZINE) 10 MG tablet TAKE 1 TABLET BY MOUTH BEFORE MEALS AND AT BEDTIME ON DAYS 2 AND 3 AFTER CHEMOTHERAPY (COUNTING CHEMOTHERAPY DAY AS DAY 1) AFTER THAT MAY TAKE AS NEEDED 30 tablet 1   rOPINIRole (REQUIP) 0.5 MG tablet 1 TABLET 1 TO 3 HOURS BEFORE BEDTIME ORALLY ONCE A DAY 90 DAYS 90 tablet 0   Tiotropium Bromide Monohydrate (SPIRIVA RESPIMAT) 2.5 MCG/ACT AERS Inhale 2 puffs into the lungs daily. 4 g 5   Tiotropium Bromide Monohydrate (SPIRIVA RESPIMAT) 2.5 MCG/ACT AERS Inhale 2 puffs into the lungs daily. 4 g 0   tiZANidine (ZANAFLEX) 4 MG tablet Take 4 mg by mouth every 8 (eight) hours as needed for muscle spasms. As needed     traMADol (ULTRAM) 50 MG tablet Take 1 tablet (50 mg total) by mouth every 6 (six) hours as needed for moderate pain. 30 tablet 0   TRULICITY 1.5 XT/0.2IO SOPN INJECT 1.5 MG INTO THE SKIN ONCE A WEEK. 2 mL 2   TURMERIC PO Take 1 capsule by mouth daily with supper.     No facility-administered medications prior to visit.    Review of Systems  Constitutional:  Negative for chills, diaphoresis, fever,  malaise/fatigue and weight loss.  HENT:  Negative for congestion.   Respiratory:  Negative for cough, hemoptysis, sputum production, shortness of breath and wheezing.   Cardiovascular:  Negative for chest pain, palpitations and leg swelling.    Objective:   Vitals:   03/20/21 1326  BP: 128/76  Pulse: 84  SpO2: 96%  Weight: 167 lb (75.8 kg)    SpO2: 96 % O2 Device: None (Room air)  Physical Exam: General: Well-appearing, no acute distress HENT: [place], AT Eyes: EOMI, no scleral icterus Respiratory: Clear to auscultation bilaterally.  No crackles, wheezing or rales Cardiovascular: RRR, -M/R/G, no JVD Extremities:-Edema,-tenderness Neuro: AAO x4, CNII-XII grossly intact Psych: Normal mood, normal affect  Data Reviewed:  Imaging: CTA 04/29/20 - No pulmonary emboli. Dependent bibasilar atelectasis. Diffuse emphysema with mosaic attenuation CT Chest 01/11/21 - Multifocal reticulonodular opacities  PFT: 08/10/20 FVC 2.61 (87%) FEV1 2.39 (105%) Ratio 87  TLC 88% DLCO 117% Interpretation: Normal spirometry. Increased DLCO suggestive of asthma No significant bronchodilator response  Labs: CBC    Component Value Date/Time   WBC 8.3 01/18/2021 1304   WBC 5.4 05/01/2020 0347   RBC 4.55 01/18/2021 1304   HGB 14.1 01/18/2021 1304   HCT 43.6 01/18/2021 1304   PLT 184 01/18/2021 1304   MCV 95.8 01/18/2021 1304   MCH 31.0 01/18/2021 1304   MCHC 32.3 01/18/2021 1304   RDW 14.9 01/18/2021 1304   LYMPHSABS 3.4 01/18/2021 1304   MONOABS 0.6 01/18/2021 1304   EOSABS 0.1 01/18/2021 1304   BASOSABS 0.0 01/18/2021 1304   Echocardiogram 11/27/20 EF 60-65%. No valvular or WMA  Assessment & Plan:   Discussion: 71 year old female never smoker with hx CAD s/p stent to LAD, hx breast cancer s/p left sided radiation completed 04/2020 who presents for follow-up of emphysema +/- occupational asthma +/-  radiation pneumonitis. She reports her shortness of breath has improved on ICS/LABA/LAMA.  Her chronic chest/lung pain has lessened but still present.   Emphysema --CONTINUE Breo 200-25 mcg ONE puff ONCE a day. --CONTINUE Spiriva 2.5 mcg TWO puffs ONCE a day --CONTINUE Albuterol AS NEEDED for shortness of breath or wheezing.   Hoarseness - improved --CONTINUE humidifier, saline spray and reflux meds   Health Maintenance Immunization History  Administered Date(s) Administered   Influenza, High Dose Seasonal PF 12/31/2016, 10/24/2017   Pneumococcal Conjugate-13 10/24/2017   Td 07/12/2005, 02/08/2019   CT Lung Screen - not indicated  No orders of the defined types were placed in this encounter.  No orders of the defined types were placed in this encounter.  Return in about 6 months (around 09/20/2021).   I have spent a total time of 31-minutes on the day of the appointment reviewing prior documentation, coordinating care and discussing medical diagnosis and plan with the patient/family. Past medical history, allergies, medications were reviewed. Pertinent imaging, labs and tests included in this note have been reviewed and interpreted independently by me.  [place], MD [facility] 03/20/2021 1:37 PM  Office Number [phone]

## 2021-03-21 ENCOUNTER — Encounter: Payer: Self-pay | Admitting: Pulmonary Disease

## 2021-03-26 ENCOUNTER — Ambulatory Visit: Payer: Medicare Other | Attending: General Surgery | Admitting: Rehabilitation

## 2021-03-26 ENCOUNTER — Other Ambulatory Visit: Payer: Self-pay

## 2021-03-26 DIAGNOSIS — Z170 Estrogen receptor positive status [ER+]: Secondary | ICD-10-CM | POA: Insufficient documentation

## 2021-03-26 DIAGNOSIS — C50412 Malignant neoplasm of upper-outer quadrant of left female breast: Secondary | ICD-10-CM

## 2021-03-26 DIAGNOSIS — Z483 Aftercare following surgery for neoplasm: Secondary | ICD-10-CM | POA: Insufficient documentation

## 2021-03-26 NOTE — Therapy (Signed)
?OUTPATIENT PHYSICAL THERAPY SOZO SCREENING NOTE ? ? ?Patient Name: Felicia [name] ?MRN: [id number] ?DOB:[birth year], 71 y.o., female ?Today's Date: 03/26/2021 ? ?PCP: Allward[place], Randa Evens, PA-C ?REFERRING PROVIDER: Rolm Bookbinder, MD ? ? PT End of Session - 03/26/21 1604   ? ? Visit Number 3   screen only  ? PT Start Time 1600   ? PT Stop Time 6659   ? PT Time Calculation (min) 4 min   ? Activity Tolerance Patient tolerated treatment well   ? Behavior During Therapy [facility] for tasks assessed/performed   ? ?  ?  ? ?  ? ? ?Past Medical History:  ?Diagnosis Date  ? Anemia   ? Anxiety   ? Breast cancer ([place]) 09/2019  ? left breast IMC  ? CAD (coronary artery disease)   ? a. 3/2007s/p DES to the LAD [facility]); b. 01/2017 MV: EF 80%, small, mild apical ant defect w/o ischemia (felt to be breast atten). Low risk.  ? Depression   ? DM (diabetes mellitus) ([place])   ? Essential hypertension   ? GERD (gastroesophageal reflux disease)   ? Headache   ? secondary to neck surgery per patient  ? High triglycerides   ? Hyperlipidemia   ? Hypothyroid   ? OA (osteoarthritis)   ? right knee,hands  ? Overflow incontinence   ? PONV (postoperative nausea and vomiting)   ? s/p gallbladder   ? RLS (restless legs syndrome)   ? Urinary urgency   ? Uterine cancer ([place]) dx'd 2014  ? ?Past Surgical History:  ?Procedure Laterality Date  ? BREAST LUMPECTOMY WITH RADIOACTIVE SEED AND SENTINEL LYMPH NODE BIOPSY Left 10/07/2019  ? Procedure: LEFT BREAST LUMPECTOMY WITH RADIOACTIVE SEED AND SENTINEL LYMPH NODE BIOPSY;  Surgeon: Rolm Bookbinder, MD;  Location: [place];  Service: General;  Laterality: Left;  ? CORONARY STENT PLACEMENT    ? CORONARY/GRAFT ACUTE MI REVASCULARIZATION    ? FINGER ARTHRODESIS Right 06/11/2019  ? Procedure: ARTHRODESIS INDEX FINGER DISTAL PHALANGEAL JOINT;  Surgeon: [name], MD;  Location: [place];  Service: Orthopedics;  Laterality: Right;  ? FOOT SURGERY Left   ? LEFT HEART CATH AND  CORONARY ANGIOGRAPHY N/A 02/16/2018  ? Procedure: LEFT HEART CATH AND CORONARY ANGIOGRAPHY;  Surgeon: Lorretta Harp, MD;  Location: [place] [facility];  Service: Cardiovascular;  Laterality: N/A;  ? LEFT HEART CATH AND CORONARY ANGIOGRAPHY N/A 05/01/2020  ? Procedure: LEFT HEART CATH AND CORONARY ANGIOGRAPHY;  Surgeon: Lorretta Harp, MD;  Location: [place] [facility];  Service: Cardiovascular;  Laterality: N/A;  ? PORTACATH PLACEMENT Right 11/23/2019  ? Procedure: INSERTION PORT-A-CATH WITH ULTRASOUND GUIDANCE;  Surgeon: Rolm Bookbinder, MD;  Location: [place];  Service: General;  Laterality: Right;  ? REPLACEMENT TOTAL KNEE Right   ? SPINE SURGERY    ? TOTAL ABDOMINAL HYSTERECTOMY    ? FOR UTERUS CANCER  ? TOTAL HIP ARTHROPLASTY Left   ? ?Patient Active Problem List  ? Diagnosis Date Noted  ? Hoarse 01/25/2021  ? Hyperglycemia due to type 2 diabetes mellitus ([place]) 01/18/2021  ? Mixed simple and mucopurulent chronic bronchitis ([place]) 12/27/2020  ? Centrilobular emphysema ([place]) 07/31/2020  ? Shortness of breath 07/31/2020  ? Pressure injury of skin 04/30/2020  ? Hypothyroid   ? Anxiety   ? Depression   ? Port-A-Cath in place 11/24/2019  ? Hepatic steatosis 09/28/2019  ? Malignant neoplasm of upper-outer quadrant of left breast in female, estrogen receptor positive ([place]) 09/02/2019  ?  Trigger index finger of right hand 05/05/2019  ? Primary osteoarthritis of first carpometacarpal joint of right hand 05/05/2019  ? Endometrial cancer ([place]) 10/23/2018  ? Cirrhosis of liver without ascites ([place]) 10/23/2018  ? Diarrhea 10/23/2018  ? Chest pain 02/15/2018  ? Coronary artery disease involving native coronary artery of native heart without angina pectoris 11/18/2017  ? Hyperlipidemia LDL goal <70 11/18/2017  ? Type 2 diabetes mellitus without complication, without long-term current use of insulin ([place]) 11/18/2017  ? Dizziness 11/18/2017  ? ? ?REFERRING DIAG: left breast cancer ? ?THERAPY DIAG:   ?Aftercare following surgery for neoplasm ? ?Malignant neoplasm of upper-outer quadrant of left breast in female, estrogen receptor positive ([place]) ? 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 She underwent a left lumpectomy and 1 positive node removed on 10/07/2019. Ki67 is 20%. She has a history of uterine cancer in 2014 treated with hysterectomy and radiation, a left hip replacement, right knee replacement, and a C4-C7 fusion in 2014.  ? ?PRECAUTIONS: Lt UE lymphedema risk ? ?SUBJECTIVE: No c/o ? ?PAIN:  ?Are you having pain? Yes: NPRS scale: 7/10 ?Pain location: Rt hip ?Pain description: aching and chronic ?Aggravating factors: walking ?Relieving factors: not much ? ?SOZO SCREENING: ?Patient was assessed today using the SOZO machine to determine the lymphedema index score. This was compared to her baseline score. It was determined that she is within the recommended range when compared to her baseline and no further action is needed at this time. She will return in 3 months for her next SOZO screen. ? ?Plan: Continue 3 month SOZO screening until at least 10/06/21 ? ?Shan Levans, PT ? ?03/26/2021, 4:07 PM ? ?  ? ?

## 2021-04-06 ENCOUNTER — Ambulatory Visit (INDEPENDENT_AMBULATORY_CARE_PROVIDER_SITE_OTHER): Payer: Medicare Other

## 2021-04-06 ENCOUNTER — Other Ambulatory Visit: Payer: Self-pay

## 2021-04-06 DIAGNOSIS — Z0000 Encounter for general adult medical examination without abnormal findings: Secondary | ICD-10-CM | POA: Diagnosis not present

## 2021-04-06 NOTE — Progress Notes (Signed)
Virtual Visit via Telephone Note ? ?I connected with  Felicia [name] on 04/06/21 at  1:45 PM EDT by telephone and verified that I am speaking with the correct person using two identifiers. ? ?Medicare Annual Wellness visit completed telephonically due to Covid-19 pandemic.  ? ?Persons participating in this call: This Health Coach and this patient.  ? ?Location: ?Patient: HOME ?Provider: Office ?  ?I discussed the limitations, risks, security and privacy concerns of performing an evaluation and management service by telephone and the availability of in person appointments. The patient expressed understanding and agreed to proceed. ? ?Unable to perform video visit due to video visit attempted and failed and/or patient does not have video capability.  ? ?Some vital signs may be absent or patient reported.  ? ?Willette Brace, LPN ? ? ?Subjective:  ? Felicia [name] is a 71 y.o. female who presents for Medicare Annual (Subsequent) preventive examination. ? ?Review of Systems    ? ?Cardiac Risk Factors include: advanced age (>53mn, >>14women);diabetes mellitus;sedentary lifestyle ? ?   ?Objective:  ?  ?There were no vitals filed for this visit. ?There is no height or weight on file to calculate BMI. ? ? ?  04/06/2021  ?  1:27 PM 11/16/2020  ? 11:00 AM 08/25/2020  ? 12:13 PM 06/22/2020  ?  1:51 PM 06/16/2020  ?  3:30 PM 04/29/2020  ? 11:00 PM 03/29/2020  ?  2:39 PM  ?Advanced Directives  ?Does Patient Have a Medical Advance Directive? Yes No No No No No No  ?Does patient want to make changes to medical advance directive? Yes (MAU/Ambulatory/Procedural Areas - Information given)        ?Would patient like information on creating a medical advance directive?  No - Patient declined No - Patient declined No - Patient declined No - Patient declined No - Patient declined No - Patient declined  ? ? ?Current Medications (verified) ?Outpatient Encounter Medications as of 04/06/2021  ?Medication Sig  ? ACCU-CHEK GUIDE test strip 1 each by  Other route as needed.  ? albuterol (VENTOLIN HFA) 108 (90 Base) MCG/ACT inhaler Inhale 2 puffs into the lungs every 6 (six) hours as needed for wheezing or shortness of breath.  ? anastrozole (ARIMIDEX) 1 MG tablet Take 1 tablet (1 mg total) by mouth daily.  ? Ascorbic Acid (VITAMIN C PO) Take 1 tablet by mouth daily.  ? aspirin EC 81 MG tablet Take 81 mg by mouth daily.  ? atorvastatin (LIPITOR) 40 MG tablet TAKE 1 TABLET BY MOUTH EVERY DAY  ? bisoprolol (ZEBETA) 5 MG tablet TAKE 1 TABLET BY MOUTH EVERY DAY  ? Calcium Citrate-Vitamin D (CALCIUM + D PO) Take 1 tablet by mouth daily with supper.  ? CINNAMON PO Take 1 capsule by mouth daily with supper.  ? diclofenac sodium (VOLTAREN) 1 % GEL Apply 2-4 g topically 4 (four) times daily as needed (as directed for pain).  ? escitalopram (LEXAPRO) 10 MG tablet Take 1 tablet (10 mg total) by mouth at bedtime.  ? Ferrous Sulfate (IRON PO) Take 1 tablet by mouth daily. alternates days:1 tablet one day and 2 tablets the next day  ? fluticasone furoate-vilanterol (BREO ELLIPTA) 100-25 MCG/ACT AEPB Inhale 1 puff into the lungs daily.  ? gabapentin (NEURONTIN) 300 MG capsule Take 1 capsule (300 mg total) by mouth at bedtime.  ? GLIPIZIDE XL 10 MG 24 hr tablet Take 2 tablets (20 mg total) by mouth daily.  ? JARDIANCE 25 MG TABS tablet Take  1 tablet (25 mg total) by mouth every morning.  ? levothyroxine (SYNTHROID) 137 MCG tablet 1 TABLET IN THE MORNING ON AN EMPTY STOMACH ORALLY ONCE A DAY 90  ? loratadine (CLARITIN) 10 MG tablet Take 1 tablet (10 mg total) by mouth daily as needed for allergies.  ? metFORMIN (GLUCOPHAGE) 500 MG tablet Take 1 tablet (500 mg total) by mouth 2 (two) times daily. Patient takes 1 tablet in AM and 2 tablets in PM  ? metroNIDAZOLE (METROGEL) 1 % gel Apply 1 application topically as needed (as directed to affected area).  ? Multiple Vitamins-Calcium (ONE-A-DAY WOMENS PO) Take 1 tablet by mouth daily.  ? nitroGLYCERIN (NITROSTAT) 0.4 MG SL tablet Place  1 tablet (0.4 mg total) under the tongue every 5 (five) minutes as needed for chest pain.  ? omeprazole (PRILOSEC) 40 MG capsule Take 1 capsule (40 mg total) by mouth daily with supper.  ? prochlorperazine (COMPAZINE) 10 MG tablet TAKE 1 TABLET BY MOUTH BEFORE MEALS AND AT BEDTIME ON DAYS 2 AND 3 AFTER CHEMOTHERAPY (COUNTING CHEMOTHERAPY DAY AS DAY 1) AFTER THAT MAY TAKE AS NEEDED  ? rOPINIRole (REQUIP) 0.5 MG tablet 1 TABLET 1 TO 3 HOURS BEFORE BEDTIME ORALLY ONCE A DAY 90 DAYS  ? Tiotropium Bromide Monohydrate (SPIRIVA RESPIMAT) 2.5 MCG/ACT AERS Inhale 2 puffs into the lungs daily.  ? tiZANidine (ZANAFLEX) 4 MG tablet Take 4 mg by mouth every 8 (eight) hours as needed for muscle spasms. As needed  ? TRULICITY 1.5 OF/7.5ZW SOPN INJECT 1.5 MG INTO THE SKIN ONCE A WEEK.  ? TURMERIC PO Take 1 capsule by mouth daily with supper.  ? traMADol (ULTRAM) 50 MG tablet Take 1 tablet (50 mg total) by mouth every 6 (six) hours as needed for moderate pain. (Patient not taking: Reported on 04/06/2021)  ? [DISCONTINUED] fluticasone furoate-vilanterol (BREO ELLIPTA) 200-25 MCG/ACT AEPB Inhale 1 puff into the lungs daily.  ? [DISCONTINUED] Tiotropium Bromide Monohydrate (SPIRIVA RESPIMAT) 2.5 MCG/ACT AERS Inhale 2 puffs into the lungs daily.  ? ?No facility-administered encounter medications on file as of 04/06/2021.  ? ? ?Allergies (verified) ?Chloraprep one step [chlorhexidine gluconate], Estrogens, and Shrimp [shellfish allergy]  ? ?History: ?Past Medical History:  ?Diagnosis Date  ? Anemia   ? Anxiety   ? Breast cancer ([place]) 09/2019  ? left breast IMC  ? CAD (coronary artery disease)   ? a. 3/2007s/p DES to the LAD [facility]); b. 01/2017 MV: EF 80%, small, mild apical ant defect w/o ischemia (felt to be breast atten). Low risk.  ? Depression   ? DM (diabetes mellitus) ([place])   ? Essential hypertension   ? GERD (gastroesophageal reflux disease)   ? Headache   ? secondary to neck surgery per patient  ? High triglycerides   ?  Hyperlipidemia   ? Hypothyroid   ? OA (osteoarthritis)   ? right knee,hands  ? Overflow incontinence   ? PONV (postoperative nausea and vomiting)   ? s/p gallbladder   ? RLS (restless legs syndrome)   ? Urinary urgency   ? Uterine cancer ([place]) dx'd 2014  ? ?Past Surgical History:  ?Procedure Laterality Date  ? BREAST LUMPECTOMY WITH RADIOACTIVE SEED AND SENTINEL LYMPH NODE BIOPSY Left 10/07/2019  ? Procedure: LEFT BREAST LUMPECTOMY WITH RADIOACTIVE SEED AND SENTINEL LYMPH NODE BIOPSY;  Surgeon: Rolm Bookbinder, MD;  Location: [facility];  Service: General;  Laterality: Left;  ? CORONARY STENT PLACEMENT    ? CORONARY/GRAFT ACUTE MI REVASCULARIZATION    ?  FINGER ARTHRODESIS Right 06/11/2019  ? Procedure: ARTHRODESIS INDEX FINGER DISTAL PHALANGEAL JOINT;  Surgeon: [name], MD;  Location: [facility];  Service: Orthopedics;  Laterality: Right;  ? FOOT SURGERY Left   ? LEFT HEART CATH AND CORONARY ANGIOGRAPHY N/A 02/16/2018  ? Procedure: LEFT HEART CATH AND CORONARY ANGIOGRAPHY;  Surgeon: Lorretta Harp, MD;  Location: [facility];  Service: Cardiovascular;  Laterality: N/A;  ? LEFT HEART CATH AND CORONARY ANGIOGRAPHY N/A 05/01/2020  ? Procedure: LEFT HEART CATH AND CORONARY ANGIOGRAPHY;  Surgeon: Lorretta Harp, MD;  Location: [facility];  Service: Cardiovascular;  Laterality: N/A;  ? PORTACATH PLACEMENT Right 11/23/2019  ? Procedure: INSERTION PORT-A-CATH WITH ULTRASOUND GUIDANCE;  Surgeon: Rolm Bookbinder, MD;  Location: [facility];  Service: General;  Laterality: Right;  ? REPLACEMENT TOTAL KNEE Right   ? SPINE SURGERY    ? TOTAL ABDOMINAL HYSTERECTOMY    ? FOR UTERUS CANCER  ? TOTAL HIP ARTHROPLASTY Left   ? ?Family History  ?Problem Relation Age of Onset  ? AAA (abdominal aortic aneurysm) Mother   ? Diabetes Father   ? Alzheimer's disease Father   ? Throat cancer Sister   ? Breast cancer Cousin   ? ?Social History  ? ?Socioeconomic History  ?  Marital status: Divorced  ?  Spouse name: Not on file  ? Number of children: Not on file  ? Years of education: Not on file  ? Highest education level: Not on file  ?Occupational History  ? Not on file  ?[employer]

## 2021-04-06 NOTE — Patient Instructions (Signed)
Felicia [name] , ?Thank you for taking time to come for your Medicare Wellness Visit. I appreciate your ongoing commitment to your health goals. Please review the following plan we discussed and let me know if I can assist you in the future.  ? ?Screening recommendations/referrals: ?Colonoscopy: Done 10/05/13 repeat every 10 years  ?Mammogram: Done 08/16/20 repeat every year  ?Bone Density: Completed 01/19/21 ?Recommended yearly ophthalmology/optometry visit for glaucoma screening and checkup ?Recommended yearly dental visit for hygiene and checkup ? ?Vaccinations: ?Influenza vaccine: Declined  ?Pneumococcal vaccine: Due ?Tdap vaccine: Done 02/08/19 repeat every 10 years  ?Shingles vaccine: Shingrix discussed. Please contact your pharmacy for coverage information.    ?Covid-19:Declined and discussed  ? ?Advanced directives: Advance directive discussed with you today. I have provided a copy for you to complete at home and have notarized. Once this is complete please bring a copy in to our office so we can scan it into your chart. ? ?Conditions/risks identified: None at this time  ? ?Next appointment: Follow up in one year for your annual wellness visit  ? ? ?Preventive Care 38 Years and Older, Female ?Preventive care refers to lifestyle choices and visits with your health care provider that can promote health and wellness. ?What does preventive care include? ?A yearly physical exam. This is also called an annual well check. ?Dental exams once or twice a year. ?Routine eye exams. Ask your health care provider how often you should have your eyes checked. ?Personal lifestyle choices, including: ?Daily care of your teeth and gums. ?Regular physical activity. ?Eating a healthy diet. ?Avoiding tobacco and drug use. ?Limiting alcohol use. ?Practicing safe sex. ?Taking low-dose aspirin every day. ?Taking vitamin and mineral supplements as recommended by your health care provider. ?What happens during an annual well check? ?The services  and screenings done by your health care provider during your annual well check will depend on your age, overall health, lifestyle risk factors, and family history of disease. ?Counseling  ?Your health care provider may ask you questions about your: ?Alcohol use. ?Tobacco use. ?Drug use. ?Emotional well-being. ?Home and relationship well-being. ?Sexual activity. ?Eating habits. ?History of falls. ?Memory and ability to understand (cognition). ?Work and work Statistician. ?Reproductive health. ?Screening  ?You may have the following tests or measurements: ?Height, weight, and BMI. ?Blood pressure. ?Lipid and cholesterol levels. These may be checked every 5 years, or more frequently if you are over 54 years old. ?Skin check. ?Lung cancer screening. You may have this screening every year starting at age 21 if you have a 30-pack-year history of smoking and currently smoke or have quit within the past 15 years. ?Fecal occult blood test (FOBT) of the stool. You may have this test every year starting at age 63. ?Flexible sigmoidoscopy or colonoscopy. You may have a sigmoidoscopy every 5 years or a colonoscopy every 10 years starting at age 11. ?Hepatitis C blood test. ?Hepatitis B blood test. ?Sexually transmitted disease (STD) testing. ?Diabetes screening. This is done by checking your blood sugar (glucose) after you have not eaten for a while (fasting). You may have this done every 1-3 years. ?Bone density scan. This is done to screen for osteoporosis. You may have this done starting at age 69. ?Mammogram. This may be done every 1-2 years. Talk to your health care provider about how often you should have regular mammograms. ?Talk with your health care provider about your test results, treatment options, and if necessary, the need for more tests. ?Vaccines  ?Your health care provider  may recommend certain vaccines, such as: ?Influenza vaccine. This is recommended every year. ?Tetanus, diphtheria, and acellular pertussis  (Tdap, Td) vaccine. You may need a Td booster every 10 years. ?Zoster vaccine. You may need this after age 79. ?Pneumococcal 13-valent conjugate (PCV13) vaccine. One dose is recommended after age 25. ?Pneumococcal polysaccharide (PPSV23) vaccine. One dose is recommended after age 27. ?Talk to your health care provider about which screenings and vaccines you need and how often you need them. ?This information is not intended to replace advice given to you by your health care provider. Make sure you discuss any questions you have with your health care provider. ?Document Released: 01/27/2015 Document Revised: 09/20/2015 Document Reviewed: 11/01/2014 ?Elsevier Interactive Patient Education ? 2017 [place]. ? ?Fall Prevention in the Home ?Falls can cause injuries. They can happen to people of all ages. There are many things you can do to make your home safe and to help prevent falls. ?What can I do on the outside of my home? ?Regularly fix the edges of walkways and driveways and fix any cracks. ?Remove anything that might make you trip as you walk through a door, such as a raised step or threshold. ?Trim any bushes or trees on the path to your home. ?Use bright outdoor lighting. ?Clear any walking paths of anything that might make someone trip, such as rocks or tools. ?Regularly check to see if handrails are loose or broken. Make sure that both sides of any steps have handrails. ?Any raised decks and porches should have guardrails on the edges. ?Have any leaves, snow, or ice cleared regularly. ?Use sand or salt on walking paths during winter. ?Clean up any spills in your garage right away. This includes oil or grease spills. ?What can I do in the bathroom? ?Use night lights. ?Install grab bars by the toilet and in the tub and shower. Do not use towel bars as grab bars. ?Use non-skid mats or decals in the tub or shower. ?If you need to sit down in the shower, use a plastic, non-slip stool. ?Keep the floor dry. Clean  up any water that spills on the floor as soon as it happens. ?Remove soap buildup in the tub or shower regularly. ?Attach bath mats securely with double-sided non-slip rug tape. ?Do not have throw rugs and other things on the floor that can make you trip. ?What can I do in the bedroom? ?Use night lights. ?Make sure that you have a light by your bed that is easy to reach. ?Do not use any sheets or blankets that are too big for your bed. They should not hang down onto the floor. ?Have a firm chair that has side arms. You can use this for support while you get dressed. ?Do not have throw rugs and other things on the floor that can make you trip. ?What can I do in the kitchen? ?Clean up any spills right away. ?Avoid walking on wet floors. ?Keep items that you use a lot in easy-to-reach places. ?If you need to reach something above you, use a strong step stool that has a grab bar. ?Keep electrical cords out of the way. ?Do not use floor polish or wax that makes floors slippery. If you must use wax, use non-skid floor wax. ?Do not have throw rugs and other things on the floor that can make you trip. ?What can I do with my stairs? ?Do not leave any items on the stairs. ?Make sure that there are handrails on both sides  of the stairs and use them. Fix handrails that are broken or loose. Make sure that handrails are as long as the stairways. ?Check any carpeting to make sure that it is firmly attached to the stairs. Fix any carpet that is loose or worn. ?Avoid having throw rugs at the top or bottom of the stairs. If you do have throw rugs, attach them to the floor with carpet tape. ?Make sure that you have a light switch at the top of the stairs and the bottom of the stairs. If you do not have them, ask someone to add them for you. ?What else can I do to help prevent falls? ?Wear shoes that: ?Do not have high heels. ?Have rubber bottoms. ?Are comfortable and fit you well. ?Are closed at the toe. Do not wear sandals. ?If you  use a stepladder: ?Make sure that it is fully opened. Do not climb a closed stepladder. ?Make sure that both sides of the stepladder are locked into place. ?Ask someone to hold it for you, if possible. ?

## 2021-04-19 ENCOUNTER — Other Ambulatory Visit: Payer: Self-pay | Admitting: Physician Assistant

## 2021-04-19 ENCOUNTER — Encounter: Payer: Self-pay | Admitting: Physician Assistant

## 2021-04-19 ENCOUNTER — Ambulatory Visit (INDEPENDENT_AMBULATORY_CARE_PROVIDER_SITE_OTHER): Payer: Medicare Other | Admitting: Physician Assistant

## 2021-04-19 ENCOUNTER — Ambulatory Visit (INDEPENDENT_AMBULATORY_CARE_PROVIDER_SITE_OTHER)
Admission: RE | Admit: 2021-04-19 | Discharge: 2021-04-19 | Disposition: A | Discharge status: Home / Self Care | Payer: Medicare Other | Source: Ambulatory Visit | Attending: Physician Assistant | Admitting: Physician Assistant

## 2021-04-19 VITALS — BP 119/79 | HR 69 | Temp 97.9°F | Ht 64.0 in | Wt 172.0 lb

## 2021-04-19 DIAGNOSIS — M791 Myalgia, unspecified site: Secondary | ICD-10-CM

## 2021-04-19 DIAGNOSIS — M25552 Pain in left hip: Secondary | ICD-10-CM

## 2021-04-19 DIAGNOSIS — M25551 Pain in right hip: Secondary | ICD-10-CM

## 2021-04-19 DIAGNOSIS — R519 Headache, unspecified: Secondary | ICD-10-CM | POA: Diagnosis not present

## 2021-04-19 DIAGNOSIS — Z79899 Other long term (current) drug therapy: Secondary | ICD-10-CM

## 2021-04-19 DIAGNOSIS — E038 Other specified hypothyroidism: Secondary | ICD-10-CM | POA: Diagnosis not present

## 2021-04-19 LAB — CBC WITH DIFFERENTIAL/PLATELET
Basophils Absolute: 0 10*3/uL (ref 0.0–0.1)
Basophils Relative: 0.3 % (ref 0.0–3.0)
Eosinophils Absolute: 0.1 10*3/uL (ref 0.0–0.7)
Eosinophils Relative: 1.1 % (ref 0.0–5.0)
HCT: 43.7 % (ref 36.0–46.0)
Hemoglobin: 14.4 g/dL (ref 12.0–15.0)
Lymphocytes Relative: 57.1 % — ABNORMAL HIGH (ref 12.0–46.0)
Lymphs Abs: 5.2 10*3/uL — ABNORMAL HIGH (ref 0.7–4.0)
MCHC: 33.1 g/dL (ref 30.0–36.0)
MCV: 96.3 fl (ref 78.0–100.0)
Monocytes Absolute: 0.5 10*3/uL (ref 0.1–1.0)
Monocytes Relative: 5.1 % (ref 3.0–12.0)
Neutro Abs: 3.3 10*3/uL (ref 1.4–7.7)
Neutrophils Relative %: 36.4 % — ABNORMAL LOW (ref 43.0–77.0)
Platelets: 203 10*3/uL (ref 150.0–400.0)
RBC: 4.53 Mil/uL (ref 3.87–5.11)
RDW: 16 % — ABNORMAL HIGH (ref 11.5–15.5)
WBC: 9.1 10*3/uL (ref 4.0–10.5)

## 2021-04-19 LAB — CK: Total CK: 42 U/L (ref 7–177)

## 2021-04-19 LAB — COMPREHENSIVE METABOLIC PANEL
ALT: 25 U/L (ref 0–35)
AST: 23 U/L (ref 0–37)
Albumin: 4.5 g/dL (ref 3.5–5.2)
Alkaline Phosphatase: 58 U/L (ref 39–117)
BUN: 16 mg/dL (ref 6–23)
CO2: 29 mEq/L (ref 19–32)
Calcium: 10.4 mg/dL (ref 8.4–10.5)
Chloride: 101 mEq/L (ref 96–112)
Creatinine, Ser: 0.65 mg/dL (ref 0.40–1.20)
GFR: 88.66 mL/min (ref >60.00)
Glucose, Bld: 211 mg/dL — ABNORMAL HIGH (ref 70–99)
Potassium: 4.7 mEq/L (ref 3.5–5.1)
Sodium: 139 mEq/L (ref 135–145)
Total Bilirubin: 0.6 mg/dL (ref 0.2–1.2)
Total Protein: 6.9 g/dL (ref 6.0–8.3)

## 2021-04-19 LAB — SEDIMENTATION RATE: Sed Rate: 33 mm/hr — ABNORMAL HIGH (ref 0–30)

## 2021-04-19 LAB — TSH: TSH: 2.05 u[IU]/mL (ref 0.35–5.50)

## 2021-04-19 LAB — C-REACTIVE PROTEIN: CRP: <1 mg/dL (ref 0.5–20.0)

## 2021-04-19 MED ORDER — PREDNISONE 50 MG PO TABS: ORAL_TABLET | ORAL | 0 refills | Status: DC

## 2021-04-19 NOTE — Patient Instructions (Signed)
Good to see you today! ?Please go to the lab for blood work and I will send results through [place]. ? ?Bilateral hip XRAY at [facility]. ? ?Start on prednisone '50mg'$  once daily for the next 5 days. Do not take any NSAIDs with this medication (aleve, ibuprofen, meloxicam, naproxen). Tylenol OK.  ?

## 2021-04-19 NOTE — Progress Notes (Signed)
? ?Subjective:  ? ? Patient ID: Felicia [name], female    DOB: [birth year], 71 y.o.   MRN: [id number] ? ?Chief Complaint  ?Patient presents with  ? Leg Swelling  ?  Left leg  ? ? ?HPI ?Patient is in today for left lower leg swelling and pain intermittently x 3 weeks. Started after dropping a box down this leg, not very heavy per patient. Says it is looking better today. No CP or SOB. No redness or bruising.  ? ?Having pain in her hips bilateral, about 8/10 yesterday, could barely move.  ?Hx left total hip replacement in 2015.  ? ?Felicia [name] is her rheumatologist. Pt states she kind of showed signs for lupus, but doesn't have official diagnosis. Didn't see her at all last year. Pt called her for appointment because of bilateral hip pain and has appointment 05/10/21 with her.  ? ?Had her port removed on 04/12/21.  ? ?Still having some tension headaches MR Brain 02/15/21 looked ok.  ?Goes from eyeball and back. Yesterday pain was right sided. Sharp pain. ?Tylenol to help pain.  ? ?Would love to do PT but says she doesn't have finances for this at this time. ? ?GP a long time ago thought she had fibromyalgia, tried Lyrica for pain, and says this didn't help.  ? ?Past Medical History:  ?Diagnosis Date  ? Anemia   ? Anxiety   ? Breast cancer ([place]) 09/2019  ? left breast IMC  ? CAD (coronary artery disease)   ? a. 3/2007s/p DES to the LAD [facility]); b. 01/2017 MV: EF 80%, small, mild apical ant defect w/o ischemia (felt to be breast atten). Low risk.  ? Depression   ? DM (diabetes mellitus) ([place])   ? Essential hypertension   ? GERD (gastroesophageal reflux disease)   ? Headache   ? secondary to neck surgery per patient  ? High triglycerides   ? Hyperlipidemia   ? Hypothyroid   ? OA (osteoarthritis)   ? right knee,hands  ? Overflow incontinence   ? PONV (postoperative nausea and vomiting)   ? s/p gallbladder   ? RLS (restless legs syndrome)   ? Urinary urgency   ? Uterine cancer ([place]) dx'd 2014  ? ? ?Past Surgical  History:  ?Procedure Laterality Date  ? BREAST LUMPECTOMY WITH RADIOACTIVE SEED AND SENTINEL LYMPH NODE BIOPSY Left 10/07/2019  ? Procedure: LEFT BREAST LUMPECTOMY WITH RADIOACTIVE SEED AND SENTINEL LYMPH NODE BIOPSY;  Surgeon: Rolm Bookbinder, MD;  Location: [facility];  Service: General;  Laterality: Left;  ? CORONARY STENT PLACEMENT    ? CORONARY/GRAFT ACUTE MI REVASCULARIZATION    ? FINGER ARTHRODESIS Right 06/11/2019  ? Procedure: ARTHRODESIS INDEX FINGER DISTAL PHALANGEAL JOINT;  Surgeon: [name], MD;  Location: [place];  Service: Orthopedics;  Laterality: Right;  ? FOOT SURGERY Left   ? LEFT HEART CATH AND CORONARY ANGIOGRAPHY N/A 02/16/2018  ? Procedure: LEFT HEART CATH AND CORONARY ANGIOGRAPHY;  Surgeon: Lorretta Harp, MD;  Location: [place] [facility];  Service: Cardiovascular;  Laterality: N/A;  ? LEFT HEART CATH AND CORONARY ANGIOGRAPHY N/A 05/01/2020  ? Procedure: LEFT HEART CATH AND CORONARY ANGIOGRAPHY;  Surgeon: Lorretta Harp, MD;  Location: [facility] [place] [facility];  Service: Cardiovascular;  Laterality: N/A;  ? PORTACATH PLACEMENT Right 11/23/2019  ? Procedure: INSERTION PORT-A-CATH WITH ULTRASOUND GUIDANCE;  Surgeon: Rolm Bookbinder, MD;  Location: [facility];  Service: General;  Laterality: Right;  ? REPLACEMENT TOTAL KNEE Right   ?  SPINE SURGERY    ? TOTAL ABDOMINAL HYSTERECTOMY    ? FOR UTERUS CANCER  ? TOTAL HIP ARTHROPLASTY Left 2015  ? ? ?Family History  ?Problem Relation Age of Onset  ? AAA (abdominal aortic aneurysm) Mother   ? Diabetes Father   ? Alzheimer's disease Father   ? Throat cancer Sister   ? Breast cancer Cousin   ? ? ?Social History  ? ?Tobacco Use  ? Smoking status: Never  ? Smokeless tobacco: Never  ?Vaping Use  ? Vaping Use: Never used  ?Substance Use Topics  ? Alcohol use: Yes  ?  Comment: OCCASIONALLY  ? Drug use: No  ?  ? ?Allergies  ?Allergen Reactions  ? Chloraprep One Step [Chlorhexidine Gluconate] Rash  ?  Estrogens Other (See Comments)  ?  PATIENT HAS A HISTORY OF CANCER AND HAS BEEN TOLD TO NEVER TAKE ANYTHING CONTAINING ESTROGEN, AS IT MIGHT CAUSE A RECURRENCE  ? Shrimp [Shellfish Allergy] Nausea And Vomiting  ? ? ?Review of Systems ?NEGATIVE UNLESS OTHERWISE INDICATED IN HPI ? ? ?   ?Objective:  ?  ? ?BP 119/79   Pulse 69   Temp 97.9 ?F (36.6 ?C)   Ht '5\' 4"'$  (1.626 m)   Wt 172 lb (78 kg)   SpO2 96%   BMI 29.52 kg/m?  ? ?Wt Readings from Last 3 Encounters:  ?04/19/21 172 lb (78 kg)  ?03/20/21 167 lb (75.8 kg)  ?02/05/21 159 lb 6.4 oz (72.3 kg)  ? ? ?BP Readings from Last 3 Encounters:  ?04/19/21 119/79  ?03/20/21 128/76  ?02/05/21 128/76  ?  ? ?Physical Exam ?Vitals and nursing note reviewed.  ?Constitutional:   ?   Appearance: Normal appearance. She is normal weight. She is not toxic-appearing.  ?HENT:  ?   Head: Normocephalic and atraumatic.  ?   Comments: Tender with tapping bilateral temporal regions ?   Nose: Nose normal.  ?   Mouth/Throat:  ?   Mouth: Mucous membranes are moist.  ?Eyes:  ?   Extraocular Movements: Extraocular movements intact.  ?   Conjunctiva/sclera: Conjunctivae normal.  ?   Pupils: Pupils are equal, round, and reactive to light.  ?Cardiovascular:  ?   Rate and Rhythm: Normal rate and regular rhythm.  ?   Pulses: Normal pulses.  ?   Heart sounds: Normal heart sounds.  ?Pulmonary:  ?   Effort: Pulmonary effort is normal.  ?   Breath sounds: Normal breath sounds.  ?Musculoskeletal:     ?   General: Normal range of motion.  ?   Cervical back: Normal range of motion and neck supple.  ?   Right hip: Tenderness present. Normal range of motion.  ?   Left hip: Tenderness present. Normal range of motion.  ?   Right lower leg: Edema present.  ?   Left lower leg: Edema present.  ?   Comments: Homan's sign neg, 1+ edema BLE, equal. No redness. Tender along lateral hips.   ?Skin: ?   General: Skin is warm and dry.  ?Neurological:  ?   General: No focal deficit present.  ?   Mental Status: She is  alert and oriented to person, place, and time.  ?   Cranial Nerves: No cranial nerve deficit.  ?   Motor: No weakness.  ?   Coordination: Coordination normal.  ?Psychiatric:     ?   Mood and Affect: Mood normal.     ?   Behavior: Behavior normal.     ?  Thought Content: Thought content normal.     ?   Judgment: Judgment normal.  ? ? ?   ?Assessment & Plan:  ? ?Problem List Items Addressed This Visit   ? ?  ? Endocrine  ? Hypothyroid  ? Relevant Orders  ? CBC with Differential/Platelet (Completed)  ? Comprehensive metabolic panel (Completed)  ? TSH (Completed)  ? ?Other Visit Diagnoses   ? ? Bilateral hip pain    -  Primary  ? Relevant Orders  ? Sedimentation rate (Completed)  ? C-reactive protein (Completed)  ? CBC with Differential/Platelet (Completed)  ? Comprehensive metabolic panel (Completed)  ? DG HIP UNILAT W OR W/O PELVIS 2-3 VIEWS RIGHT  ? Muscle pain      ? Relevant Orders  ? Sedimentation rate (Completed)  ? C-reactive protein (Completed)  ? CK (Creatine Kinase) (Completed)  ? CBC with Differential/Platelet (Completed)  ? Comprehensive metabolic panel (Completed)  ? DG HIP UNILAT W OR W/O PELVIS 2-3 VIEWS RIGHT  ? Temporal headache      ? Relevant Orders  ? Sedimentation rate (Completed)  ? C-reactive protein (Completed)  ? CK (Creatine Kinase) (Completed)  ? CBC with Differential/Platelet (Completed)  ? Comprehensive metabolic panel (Completed)  ? On statin therapy      ? Relevant Orders  ? CK (Creatine Kinase) (Completed)  ? ?  ? ? ? ?Meds ordered this encounter  ?Medications  ? predniSONE (DELTASONE) 50 MG tablet  ?  Sig: Take one tablet daily po x 5 days.  ?  Dispense:  5 tablet  ?  Refill:  0  ? ?1. Bilateral hip pain ?Plan for XRAY ?Discussed with patient possibility of PMR given her symptoms, will check sed rate and CRP ?Prednisone 50 mg 1 tab po x 5 days to help ?Consider referral to sports med if worse or no imp ? ?2. Muscle pain ??Fibromyalgia ?Check inflam markers ?Check CK ? ?3. Temporal  headache ?Less suspicious of GCA given bilateral nature and ongoing headaches for months. Recent MRI negative. I reviewed this. Will continue to work on etiology and treatment.  ? ?4. On statin therapy ?Need to check CK l

## 2021-04-24 ENCOUNTER — Other Ambulatory Visit: Payer: Self-pay | Admitting: *Deleted

## 2021-04-24 ENCOUNTER — Telehealth: Payer: Self-pay | Admitting: Physician Assistant

## 2021-04-24 ENCOUNTER — Telehealth: Payer: Self-pay | Admitting: Pulmonary Disease

## 2021-04-24 DIAGNOSIS — D72820 Lymphocytosis (symptomatic): Secondary | ICD-10-CM

## 2021-04-24 NOTE — Telephone Encounter (Signed)
Left message for patient to call back  

## 2021-04-24 NOTE — Telephone Encounter (Signed)
Called and spoke with patient. She stated that she used her albuterol inhaler about 3 hours ago with just 1 puff. She wanted to know if she could use it again. I advised her that she could use the inhaler 2 puffs every 4-6 hours. She verbalized understanding.  ? ?I offered to send a message back to Dr. Loanne Drilling but she stated that she just wanted to know about the inhaler. She is aware to call us back if anything changes.  ?

## 2021-04-24 NOTE — Telephone Encounter (Signed)
Patient called to sch lab apt for next week - apt has been made for 4/17- no lab orders avl, please add.  ?

## 2021-04-24 NOTE — Telephone Encounter (Signed)
Orders placed in Epic

## 2021-04-24 NOTE — Progress Notes (Signed)
Orders placed for cbc and peripheral blood smear.  ?

## 2021-04-27 ENCOUNTER — Other Ambulatory Visit: Payer: Self-pay | Admitting: Cardiovascular Disease

## 2021-04-30 ENCOUNTER — Other Ambulatory Visit (INDEPENDENT_AMBULATORY_CARE_PROVIDER_SITE_OTHER): Payer: Medicare Other

## 2021-04-30 DIAGNOSIS — D72820 Lymphocytosis (symptomatic): Secondary | ICD-10-CM | POA: Diagnosis not present

## 2021-04-30 LAB — CBC WITH DIFFERENTIAL/PLATELET
Basophils Absolute: 0 10*3/uL (ref 0.0–0.1)
Basophils Relative: 0.2 % (ref 0.0–3.0)
Eosinophils Absolute: 0 10*3/uL (ref 0.0–0.7)
Eosinophils Relative: 0.4 % (ref 0.0–5.0)
HCT: 43.5 % (ref 36.0–46.0)
Hemoglobin: 14.4 g/dL (ref 12.0–15.0)
Lymphocytes Relative: 42.3 % (ref 12.0–46.0)
Lymphs Abs: 4.3 10*3/uL — ABNORMAL HIGH (ref 0.7–4.0)
MCHC: 33.1 g/dL (ref 30.0–36.0)
MCV: 97.5 fl (ref 78.0–100.0)
Monocytes Absolute: 0.6 10*3/uL (ref 0.1–1.0)
Monocytes Relative: 6.1 % (ref 3.0–12.0)
Neutro Abs: 5.2 10*3/uL (ref 1.4–7.7)
Neutrophils Relative %: 51 % (ref 43.0–77.0)
Platelets: 191 10*3/uL (ref 150.0–400.0)
RBC: 4.46 Mil/uL (ref 3.87–5.11)
RDW: 15.1 % (ref 11.5–15.5)
WBC: 10.1 10*3/uL (ref 4.0–10.5)

## 2021-05-01 LAB — PATHOLOGIST SMEAR REVIEW

## 2021-05-02 ENCOUNTER — Other Ambulatory Visit: Payer: Self-pay | Admitting: Physician Assistant

## 2021-05-02 DIAGNOSIS — R079 Chest pain, unspecified: Secondary | ICD-10-CM

## 2021-05-04 ENCOUNTER — Ambulatory Visit: Payer: Medicare Other | Admitting: Cardiovascular Disease

## 2021-05-04 ENCOUNTER — Encounter: Payer: Self-pay | Admitting: Cardiovascular Disease

## 2021-05-04 VITALS — BP 136/74 | HR 78 | Ht 64.0 in | Wt 172.8 lb

## 2021-05-04 DIAGNOSIS — E785 Hyperlipidemia, unspecified: Secondary | ICD-10-CM | POA: Diagnosis not present

## 2021-05-04 DIAGNOSIS — I2510 Atherosclerotic heart disease of native coronary artery without angina pectoris: Secondary | ICD-10-CM

## 2021-05-04 MED ORDER — NITROGLYCERIN 0.4 MG SL SUBL: 0.4000 mg | SUBLINGUAL_TABLET | SUBLINGUAL | 6 refills | Status: DC | PRN

## 2021-05-04 NOTE — Patient Instructions (Signed)
Medication Instructions:  ?Your physician recommends that you continue on your current medications as directed. Please refer to the Current Medication list given to you today. ? ?*If you need a refill on your cardiac medications before your next appointment, please call your pharmacy* ? ?Lab Work: ?TODAY: Lipids ? ?Testing/Procedures: ?NONE ? ?Follow-Up: ?At [facility], you and your health needs are our priority.  As part of our continuing mission to provide you with exceptional heart care, we have created designated Provider Care Teams.  These Care Teams include your primary Cardiologist (physician) and Advanced Practice Providers (APPs -  Physician Assistants and Nurse Practitioners) who all work together to provide you with the care you need, when you need it. ? ?Your next appointment:   ?1 year(s) ? ?The format for your next appointment:   ?In Person ? ?Provider:   ?Mertie Moores, MD  or Robbie Lis, PA-C, Christen Bame, NP, or Richardson Dopp, PA-C      ? ? ?Important Information About Sugar ? ? ? ? ?  ?

## 2021-05-04 NOTE — Progress Notes (Signed)
?Cardiology Office Note:   ? ?Date:  05/04/2021  ? ?ID:  Aries Kasa, DOB 08/25/1950, MRN [id number] ? ?PCP:  Allwardt, Randa Evens, PA-C  ?Cardiologist:  Mertie Moores, MD   ? ?Referring MD: Fredirick Lathe, PA-C  ? ?No chief complaint on file. ? ?Problem list ?1.  Coronary artery disease - March, 2017,  Resolute Integrity 2.25 x 18 mm stent - mid LAD  ?2.  Diabetes mellitus ?3.  Hypothyroidism ?4.  Hyperlipidemia ? ?  ? ?Dyanna Seiter is a 71 y.o. female with a hx of coronary artery disease, diabetes mellitus, and hypothyroidism.  She recently moved from [place].  She is here to establish cardiology care. ? ?Has had some unusual chest pressure .   Not exactily like her previous discomfort prior stenting . ?Had a stress echo which suggested  CAD.   ? ?Has chest pressure -   Band like sensation,  Does not exercise ?She does not think it worsens with exertion. ,  Does not radiate, not associated with increased dyspnea.  ?Not associated with presyncope .  ?The sensation comes and goes.   The pain has been present for the past 3 months .  ? ?Is on Bisoprilol 5 mg a day  ?Tried Metoprolol but this caused lots of fatigue and sleepyness.  ? ?May 06, 2017: ?Our were seen today for follow-up of her coronary artery disease.  She is status post stenting of her mid LAD March 2017. ?Is a history of hyperlipidemia. ?No angina, ?Exercising in the pool regularly  ?Has lots of arthritis .  ?Suggested a stationary bike or elliptical bike  ? ?Sept. 2020 ? ?Has been working - [employer] in [employer]  ?Not much cardiiac exercise  ?No CP or dyspnea  ?We discussed her elevated Triglyceride levels and discussed Vascepa - she does not think she could afford it  ? ?May 07, 2019: ? ?Brion is seen today for follow-up of her coronary artery disease.  She is status post stenting of her mid LAD in March, 2017.  She has a history of hyperlipidemia. ?A little bit of exercise.   ?Does PT  ?Still working - the [employer] at [employer]  ? ?Needs  to have right trigger finger surgery  [name], MD)  ?She is at low risk for this finger surgery  ?She may hold her ASA for 5-7 days prior to surgery if needed for her surgery  ? ?Labs were drawn 2 days ago  ?Goal LDL is < 70  ?Her trigs have been elevated.   Goal trig level is 150  ? ?Sept. 13,  2021 ? ?Pattijo is seen today for follow up . ?Hx of CAD ?Had finger surgery with Dr. Fredna Dow since I last saw her  ?Is doing well  ?No CP or dyspnea  ?Has had some palpitations , last for a few seconds.  ?Found out recently that she had left breast cancer ( found on mamogram ) ? ?She will likely be following up with Dr. Haroldine Laws for the next year  ? ?May 04, 2021: ? ?Beata is seen back today for follow-up of her palpitations. ?Has had more complications with her COPD  ?Has CP related to her COPD  ?CP with a deep breath  ?Was hospitalized with the cp , ?Cath in April 2022 revealed a patent stent .  ? ?Past Medical History:  ?Diagnosis Date  ? Anemia   ? Anxiety   ? Breast cancer ([place]) 09/2019  ? left  breast IMC  ? CAD (coronary artery disease)   ? a. 3/2007s/p DES to the LAD [facility]); b. 01/2017 MV: EF 80%, small, mild apical ant defect w/o ischemia (felt to be breast atten). Low risk.  ? Depression   ? DM (diabetes mellitus) ([place])   ? Essential hypertension   ? GERD (gastroesophageal reflux disease)   ? Headache   ? secondary to neck surgery per patient  ? High triglycerides   ? Hyperlipidemia   ? Hypothyroid   ? OA (osteoarthritis)   ? right knee,hands  ? Overflow incontinence   ? PONV (postoperative nausea and vomiting)   ? s/p gallbladder   ? RLS (restless legs syndrome)   ? Urinary urgency   ? Uterine cancer ([place]) dx'd 2014  ? ? ?Past Surgical History:  ?Procedure Laterality Date  ? BREAST LUMPECTOMY WITH RADIOACTIVE SEED AND SENTINEL LYMPH NODE BIOPSY Left 10/07/2019  ? Procedure: LEFT BREAST LUMPECTOMY WITH RADIOACTIVE SEED AND SENTINEL LYMPH NODE BIOPSY;  Surgeon: Rolm Bookbinder, MD;  Location:  [facility];  Service: General;  Laterality: Left;  ? CORONARY STENT PLACEMENT    ? CORONARY/GRAFT ACUTE MI REVASCULARIZATION    ? FINGER ARTHRODESIS Right 06/11/2019  ? Procedure: ARTHRODESIS INDEX FINGER DISTAL PHALANGEAL JOINT;  Surgeon: [name], MD;  Location: [facility];  Service: Orthopedics;  Laterality: Right;  ? FOOT SURGERY Left   ? LEFT HEART CATH AND CORONARY ANGIOGRAPHY N/A 02/16/2018  ? Procedure: LEFT HEART CATH AND CORONARY ANGIOGRAPHY;  Surgeon: Lorretta Harp, MD;  Location: [facility];  Service: Cardiovascular;  Laterality: N/A;  ? LEFT HEART CATH AND CORONARY ANGIOGRAPHY N/A 05/01/2020  ? Procedure: LEFT HEART CATH AND CORONARY ANGIOGRAPHY;  Surgeon: Lorretta Harp, MD;  Location: [facility];  Service: Cardiovascular;  Laterality: N/A;  ? PORTACATH PLACEMENT Right 11/23/2019  ? Procedure: INSERTION PORT-A-CATH WITH ULTRASOUND GUIDANCE;  Surgeon: Rolm Bookbinder, MD;  Location: [facility];  Service: General;  Laterality: Right;  ? REPLACEMENT TOTAL KNEE Right   ? SPINE SURGERY    ? TOTAL ABDOMINAL HYSTERECTOMY    ? FOR UTERUS CANCER  ? TOTAL HIP ARTHROPLASTY Left 2015  ? ? ?Current Medications: ?Current Meds  ?Medication Sig  ? ACCU-CHEK GUIDE test strip 1 each by Other route as needed.  ? albuterol (VENTOLIN HFA) 108 (90 Base) MCG/ACT inhaler Inhale 2 puffs into the lungs every 6 (six) hours as needed for wheezing or shortness of breath.  ? anastrozole (ARIMIDEX) 1 MG tablet Take 1 tablet (1 mg total) by mouth daily.  ? Ascorbic Acid (VITAMIN C PO) Take 1 tablet by mouth daily.  ? aspirin EC 81 MG tablet Take 81 mg by mouth daily.  ? atorvastatin (LIPITOR) 40 MG tablet TAKE 1 TABLET BY MOUTH EVERY DAY  ? bisoprolol (ZEBETA) 5 MG tablet TAKE 1 TABLET BY MOUTH EVERY DAY  ? Calcium Citrate-Vitamin D (CALCIUM + D PO) Take 1 tablet by mouth daily with supper.  ? CINNAMON PO Take 1 capsule by mouth daily with supper.  ?  diclofenac sodium (VOLTAREN) 1 % GEL Apply 2-4 g topically 4 (four) times daily as needed (as directed for pain).  ? escitalopram (LEXAPRO) 10 MG tablet Take 1 tablet (10 mg total) by mouth at bedtime.  ? Ferrous Sulfate (IRON PO) Take 1 tablet by mouth daily. alternates days:1 tablet one day and 2 tablets the next day  ? fluticasone furoate-vilanterol (BREO ELLIPTA) 100-25 MCG/ACT AEPB  Inhale 1 puff into the lungs daily.  ? gabapentin (NEURONTIN) 300 MG capsule Take 1 capsule (300 mg total) by mouth at bedtime.  ? GLIPIZIDE XL 10 MG 24 hr tablet Take 2 tablets (20 mg total) by mouth daily.  ? JARDIANCE 25 MG TABS tablet Take 1 tablet (25 mg total) by mouth every morning.  ? levothyroxine (SYNTHROID) 137 MCG tablet 1 TABLET IN THE MORNING ON AN EMPTY STOMACH ORALLY ONCE A DAY 90  ? loratadine (CLARITIN) 10 MG tablet Take 1 tablet (10 mg total) by mouth daily as needed for allergies.  ? metFORMIN (GLUCOPHAGE) 500 MG tablet Take 1 tablet (500 mg total) by mouth 2 (two) times daily. Patient takes 1 tablet in AM and 2 tablets in PM  ? metroNIDAZOLE (METROGEL) 1 % gel Apply 1 application topically as needed (as directed to affected area).  ? Multiple Vitamins-Calcium (ONE-A-DAY WOMENS PO) Take 1 tablet by mouth daily.  ? omeprazole (PRILOSEC) 40 MG capsule Take 1 capsule (40 mg total) by mouth daily with supper.  ? prochlorperazine (COMPAZINE) 10 MG tablet TAKE 1 TABLET BY MOUTH BEFORE MEALS AND AT BEDTIME ON DAYS 2 AND 3 AFTER CHEMOTHERAPY (COUNTING CHEMOTHERAPY DAY AS DAY 1) AFTER THAT MAY TAKE AS NEEDED  ? rOPINIRole (REQUIP) 0.5 MG tablet TAKE 1 TABLET BY MOUTH EVERY DAY 1 TO 3 HOURS BEFORE BEDTIME  ? Tiotropium Bromide Monohydrate (SPIRIVA RESPIMAT) 2.5 MCG/ACT AERS Inhale 2 puffs into the lungs daily.  ? tiZANidine (ZANAFLEX) 4 MG tablet Take 4 mg by mouth every 8 (eight) hours as needed for muscle spasms. As needed  ? traMADol (ULTRAM) 50 MG tablet Take 1 tablet (50 mg total) by mouth every 6 (six) hours as needed  for moderate pain.  ? TRULICITY 1.5 SJ/6.2EZ SOPN INJECT 1.5 MG INTO THE SKIN ONCE A WEEK.  ? TURMERIC PO Take 1 capsule by mouth daily with supper.  ? [DISCONTINUED] nitroGLYCERIN (NITROSTAT) 0.4 MG SL tablet P

## 2021-05-05 LAB — LIPID PANEL
Chol/HDL Ratio: 4.9 ratio — ABNORMAL HIGH (ref 0.0–4.4)
Cholesterol, Total: 242 mg/dL — ABNORMAL HIGH (ref 100–199)
HDL: 49 mg/dL (ref >39)
Triglycerides: 1034 mg/dL (ref 0–149)

## 2021-05-07 ENCOUNTER — Ambulatory Visit: Payer: Medicare Other | Admitting: Physician Assistant

## 2021-05-08 ENCOUNTER — Ambulatory Visit: Payer: Medicare Other | Admitting: Physician Assistant

## 2021-05-09 ENCOUNTER — Telehealth: Payer: Self-pay | Admitting: Physician Assistant

## 2021-05-09 NOTE — Telephone Encounter (Signed)
Pt would like to know she can have a vv apt with alyssa for lab results-  ?

## 2021-05-10 ENCOUNTER — Telehealth: Payer: Self-pay

## 2021-05-10 DIAGNOSIS — E785 Hyperlipidemia, unspecified: Secondary | ICD-10-CM

## 2021-05-10 DIAGNOSIS — Z79899 Other long term (current) drug therapy: Secondary | ICD-10-CM

## 2021-05-10 MED ORDER — FENOFIBRATE 145 MG PO TABS: 145.0000 mg | ORAL_TABLET | Freq: Every day | ORAL | 3 refills | Status: DC

## 2021-05-10 NOTE — Telephone Encounter (Signed)
Lab results and discuss medication her specialist put her on - she does not have access to a camera she can only do a telephone appointment- it is hard for her to come in due to knee pain. She sch 5/2 - will alyssa be willing to do a telephone apt?  ?

## 2021-05-10 NOTE — Telephone Encounter (Signed)
Please see below. Thanks!

## 2021-05-10 NOTE — Telephone Encounter (Signed)
What's the reason for the virtual? ?

## 2021-05-10 NOTE — Telephone Encounter (Signed)
Spoke with patient and reviewed results. Lab orders placed at this time and appt scheduled for 7/28. Fenofibrate called into pharmacy on file. ?

## 2021-05-10 NOTE — Telephone Encounter (Signed)
-----   Message from Thayer Headings, MD sent at 05/07/2021  5:01 PM EDT ----- ?Trigs are elevated . ?Cont atorvastatin  ?Add fenofibrate 145 mg a day  ?She should be very careful with her carbs and fatty foods.  ?Repeat lipid profile and ALT in 3 months ?

## 2021-05-15 ENCOUNTER — Telehealth: Payer: Self-pay | Admitting: Physician Assistant

## 2021-05-15 ENCOUNTER — Encounter: Payer: Self-pay | Admitting: Physician Assistant

## 2021-05-15 ENCOUNTER — Ambulatory Visit (INDEPENDENT_AMBULATORY_CARE_PROVIDER_SITE_OTHER): Payer: Medicare Other | Admitting: Physician Assistant

## 2021-05-15 VITALS — Ht 64.0 in | Wt 172.8 lb

## 2021-05-15 DIAGNOSIS — Z5986 Financial insecurity: Secondary | ICD-10-CM

## 2021-05-15 DIAGNOSIS — J432 Centrilobular emphysema: Secondary | ICD-10-CM

## 2021-05-15 DIAGNOSIS — R45851 Suicidal ideations: Secondary | ICD-10-CM

## 2021-05-15 DIAGNOSIS — Z59869 Financial insecurity, unspecified: Secondary | ICD-10-CM

## 2021-05-15 DIAGNOSIS — Z5982 Transportation insecurity: Secondary | ICD-10-CM | POA: Diagnosis not present

## 2021-05-15 NOTE — Chronic Care Management (AMB) (Signed)
?  Chronic Care Management  ? ?Note ? ?05/15/2021 ?Name: Felicia [name] MRN: [id number] DOB: Sep 30, 1950 ? ?Felicia [name] is a 71 y.o. year old female who is a primary care patient of Allwardt, Randa Evens, PA-C. I reached out to [name] by phone today in response to a referral sent by Felicia [name] PCP, Allwardt, Randa Evens, PA-C.  ? ?Felicia [name] was given information about Chronic Care Management services today including:  ?CCM service includes personalized support from designated clinical staff supervised by her physician, including individualized plan of care and coordination with other care providers ?24/7 contact phone numbers for assistance for urgent and routine care needs. ?Service will only be billed when office clinical staff spend 20 minutes or more in a month to coordinate care. ?Only one practitioner may furnish and bill the service in a calendar month. ?The patient may stop CCM services at any time (effective at the end of the month) by phone call to the office staff. ? ? ?Patient agreed to services and verbal consent obtained.  ? ?Follow up plan:referral/ no copay ? ? ?Tatjana Dellinger ?[facility] Scheduler  ?

## 2021-05-15 NOTE — Progress Notes (Signed)
Virtual Visit via Telephone Note ? ?I connected with Felicia [name] on 05/15/21 at 11:45 AM EDT by telephone and verified that I am speaking with the correct person using two identifiers. ? ?Location: ?Patient: home ?Provider: Therapist, music at [employer] ?  ?I discussed the limitations, risks, security and privacy concerns of performing an evaluation and management service by telephone and the availability of in person appointments. I also discussed with the patient that there may be a patient responsible charge related to this service. The patient expressed understanding and agreed to proceed. ? ? ?History of Present Illness: ? ?71 year old female Caucasian patient presents for telephone visit initially scheduled to discuss lab results.  However, patient immediately states her vehicle has been repossessed and then proceeds to say "I'm really down and wish I was dead." No plan or intentions at this time. "I'm not brave enough to hurt myself or do anything." "I just got a job and now I don't have a job anymore because of the transportation."  She has been talking to a neighbor about how she is feeling.  She has been contemplating how she would follow through with any actions but wanted to be quick and painless.  She is taking Lexapro 10 mg. ? ?Patient also states, "My chest and lungs have been hurting."  ?-This has been going on since radiation. Thinks the radiation hurt her lungs. ?-Breo & Spiriva; also has rescue inhaler ?-She has had f/up with Dr. Acie Fredrickson recently on 05/04/21 and was reassured ?-She also follows with pulmonology  ?-No changes in symptoms since visit with cardiologist ?-Denies any severe or worsening symptoms today. ?  ?Observations/Objective: ? ?Sounds very down and discouraged on the phone.  Speaking in full sentences, coherent. ? ?Assessment and Plan: ? ?1. Financial insecurity ?2. Transportation insecurity ?It looks like patient has worked with a Education officer, museum in the past and last  visit was in January.  I informed patient that I would place an urgent referral to CCM to get on her case today and see how we can best help as far as transportation, medications, finances, overall wellbeing.  Patient sounded hopeful when I discussed this with her. ? ?3. Suicidal thoughts ?Informed patient to call [phone] immediately after waking up.  She may need to consider getting transportation to the behavioral health crisis center.  She was agreeable to call [phone].  I also discussed with her about increasing the Lexapro to 20 mg and she will do that as well.  Very close follow-up with her to be scheduled. ? ?4. Centrilobular emphysema ([place]) ?She is following up with pulmonology.  I think this is the cause of her chest and shortness of breath symptoms.  Very recent visit with cardiology was reassuring. ? ?Plan to follow-up with patient in the next few days to see how she is doing. ? ? ?Follow Up Instructions: ? ?  ?I discussed the assessment and treatment plan with the patient. The patient was provided an opportunity to ask questions and all were answered. The patient agreed with the plan and demonstrated an understanding of the instructions. ?  ?The patient was advised to call back or seek an in-person evaluation if the symptoms worsen or if the condition fails to improve as anticipated. ? ?I provided 28 minutes & 19 seconds of non-face-to-face time during this encounter. ? ? ?Fleeta Kunde M Kerrianne Jeng, PA-C ? ?

## 2021-05-15 NOTE — Progress Notes (Signed)
? ?Chronic Care Management ?Pharmacy Note ? ?05/18/2021 ?Name:  Felicia [name] MRN:  [id number] DOB:  August 23, 1950 ? ?Summary: ?Initial visit with PharmD urgent referral from PCP.  Patient reports increased Lexapro dose is helping her.  I am going to have social work reach out in regards to healthier meals that can be delivered.  I am going to help her get set up with [facility] for med delivery and pill packs.  Encouraged her to reach out with increasing suicidal thoughts etc.  Further education needed on diabetes and lifestyle changes to reach her goals ? ?Recommendations/Changes made from today's visit: ?Nutrition consult ?PCP visit for recheck A1c ?Social worker for easier meals ?[facility] for pill packs ? ?Plan: ?FU 1 month ? ? ?Subjective: ?Felicia [name] is an 71 y.o. year old female who is a primary patient of Allwardt, Alyssa M, PA-C.  The CCM team was consulted for assistance with disease management and care coordination needs.   ? ?Engaged with patient by telephone for initial visit in response to provider referral for pharmacy case management and/or care coordination services.  ? ?Consent to Services:  ?The patient was given the following information about Chronic Care Management services today, agreed to services, and gave verbal consent: 1. CCM service includes personalized support from designated clinical staff supervised by the primary care provider, including individualized plan of care and coordination with other care providers 2. 24/7 contact phone numbers for assistance for urgent and routine care needs. 3. Service will only be billed when office clinical staff spend 20 minutes or more in a month to coordinate care. 4. Only one practitioner may furnish and bill the service in a calendar month. 5.The patient may stop CCM services at any time (effective at the end of the month) by phone call to the office staff. 6. The patient will be responsible for cost sharing (co-pay) of up to 20% of the service fee  (after annual deductible is met). Patient agreed to services and consent obtained. ? ?Patient Care Team: ?Allwardt, Randa Evens, PA-C as PCP - General (Physician Assistant) ?Nahser, [name], MD as PCP - Cardiology (Cardiology) ?Rolm Bookbinder, MD as Consulting Physician (General Surgery) ?Eppie Gibson, MD as Attending Physician (Radiation Oncology) ?[name], MD as Consulting Physician (Rheumatology) ?[name], MD as Consulting Physician (Orthopedic Surgery) ?Madelin Rear, MD as Consulting Physician (Endocrinology) ?Margaretha Seeds, MD as Consulting Physician (Pulmonary Disease) ?Nicholas Lose, MD as Consulting Physician (Hematology and Oncology) ?Edythe Clarity, [facility] as Pharmacist (Pharmacist) ? ?Recent office visits:  ?05/15/2021 [facility] (PCP) Allwardt, Randa Evens, PA-C; Call [phone] when we hang up today, increase Lexapro to 20 mg. ?  ?04/19/2021 [facility] (PCP) Allwardt, Alyssa M, PA-C; Prednisone 50 mg 1 tab po x 5 days to help with hip pain ?  ?Recent consult visits:  ?05/04/2021 [facility] (Cardiology) Nahser, [name], MD; no medication changes indicated. ? ? ? ? ?Objective: ? ?Lab Results  ?Component Value Date  ? CREATININE 0.65 04/19/2021  ? BUN 16 04/19/2021  ? GFR 88.66 04/19/2021  ? GFRNONAA >60 01/18/2021  ? GFRAA >60 10/04/2019  ? NA 139 04/19/2021  ? K 4.7 04/19/2021  ? CALCIUM 10.4 04/19/2021  ? CO2 29 04/19/2021  ? GLUCOSE 211 (H) 04/19/2021  ? ? ?Lab Results  ?Component Value Date/Time  ? HGBA1C 9.1 (H) 02/05/2021 01:26 PM  ? HGBA1C 8.3 (H) 10/06/2020 01:56 PM  ? GFR 88.66 04/19/2021 12:06 PM  ?  ?Last diabetic Eye exam: No results found for: HMDIABEYEEXA  ?Last  diabetic Foot exam: No results found for: HMDIABFOOTEX  ? ?Lab Results  ?Component Value Date  ? CHOL 242 (H) 05/04/2021  ? HDL 49 05/04/2021  ? Faison Comment (A) 05/04/2021  ? TRIG 1,034 ([place]) 05/04/2021  ? CHOLHDL 4.9 (H) 05/04/2021  ? ? ? ?  Latest Ref Rng & Units 04/19/2021  ? 12:06 PM 01/18/2021  ?  1:04 PM 11/16/2020  ? 10:02 AM  ?Hepatic  Function  ?Total Protein 6.0 - 8.3 g/dL 6.9   7.5   7.2    ?Albumin 3.5 - 5.2 g/dL 4.5   4.1   3.7    ?AST 0 - 37 U/L 23   28   29     ?ALT 0 - 35 U/L 25   28   38    ?Alk Phosphatase 39 - 117 U/L 58   67   78    ?Total Bilirubin 0.2 - 1.2 mg/dL 0.6   0.4   0.4    ? ? ?Lab Results  ?Component Value Date/Time  ? TSH 2.05 04/19/2021 12:06 PM  ? TSH 0.37 02/05/2021 01:26 PM  ? ? ? ?  Latest Ref Rng & Units 04/30/2021  ?  1:54 PM 04/19/2021  ? 12:06 PM 01/18/2021  ?  1:04 PM  ?CBC  ?WBC 4.0 - 10.5 K/uL 10.1   9.1   8.3    ?Hemoglobin 12.0 - 15.0 g/dL 14.4   14.4   14.1    ?Hematocrit 36.0 - 46.0 % 43.5   43.7   43.6    ?Platelets 150.0 - 400.0 K/uL 191.0   203.0   184    ? ? ?No results found for: VD25OH ? ?Clinical ASCVD: No  ?The 10-year ASCVD risk score (Arnett DK, et al., 2019) is: 29.7% ?  Values used to calculate the score: ?    Age: 57 years ?    Sex: Female ?    Is Non-Hispanic African American: No ?    Diabetic: Yes ?    Tobacco smoker: No ?    Systolic Blood Pressure: 762 mmHg ?    Is BP treated: Yes ?    HDL Cholesterol: 49 mg/dL ?    Total Cholesterol: 242 mg/dL   ? ? ?  04/06/2021  ?  1:25 PM 01/18/2021  ?  1:52 PM 09/08/2019  ? 12:36 PM  ?Depression screen PHQ 2/9  ?Decreased Interest 0 0 0  ?Down, Depressed, Hopeless 1 1 0  ?PHQ - 2 Score 1 1 0  ?  ? ? ?Social History  ? ?Tobacco Use  ?Smoking Status Never  ?Smokeless Tobacco Never  ? ?BP Readings from Last 3 Encounters:  ?05/04/21 136/74  ?04/19/21 119/79  ?03/20/21 128/76  ? ?Pulse Readings from Last 3 Encounters:  ?05/04/21 78  ?04/19/21 69  ?03/20/21 84  ? ?Wt Readings from Last 3 Encounters:  ?05/15/21 172 lb 13.5 oz (78.4 kg)  ?05/04/21 172 lb 12.8 oz (78.4 kg)  ?04/19/21 172 lb (78 kg)  ? ?BMI Readings from Last 3 Encounters:  ?05/15/21 29.67 kg/m?  ?05/04/21 29.66 kg/m?  ?04/19/21 29.52 kg/m?  ? ? ?Assessment/Interventions: Review of patient past medical history, allergies, medications, health status, including review of consultants reports, laboratory and  other test data, was performed as part of comprehensive evaluation and provision of chronic care management services.  ? ?SDOH:  (Social Determinants of Health) assessments and interventions performed: Yes ? ?Financial Resource Strain: Low Risk   ? Difficulty of Paying Living  Expenses: Not hard at all  ? ?Food Insecurity: No Food Insecurity  ? Worried About Charity fundraiser in the Last Year: Never true  ? Ran Out of Food in the Last Year: Never true  ? ? ?SDOH Screenings  ? ?Alcohol Screen: Low Risk   ? Last Alcohol Screening Score (AUDIT): 0  ?Depression (PHQ2-9): Low Risk   ? PHQ-2 Score: 1  ?Financial Resource Strain: Low Risk   ? Difficulty of Paying Living Expenses: Not hard at all  ?Food Insecurity: No Food Insecurity  ? Worried About Charity fundraiser in the Last Year: Never true  ? Ran Out of Food in the Last Year: Never true  ?Housing: Low Risk   ? Last Housing Risk Score: 0  ?Physical Activity: Inactive  ? Days of Exercise per Week: 0 days  ? Minutes of Exercise per Session: 0 min  ?Social Connections: Socially Isolated  ? Frequency of Communication with Friends and Family: Once a week  ? Frequency of Social Gatherings with Friends and Family: Once a week  ? Attends Religious Services: Never  ? Active Member of Clubs or Organizations: No  ? Attends Archivist Meetings: Never  ? Marital Status: Divorced  ?Stress: Stress Concern Present  ? Feeling of Stress : To some extent  ?Tobacco Use: Low Risk   ? Smoking Tobacco Use: Never  ? Smokeless Tobacco Use: Never  ? Passive Exposure: Not on file  ?Transportation Needs: No Transportation Needs  ? Lack of Transportation (Medical): No  ? Lack of Transportation (Non-Medical): No  ? ? ?CCM Care Plan ? ?Allergies  ?Allergen Reactions  ? Chloraprep One Step [Chlorhexidine Gluconate] Rash  ? Estrogens Other (See Comments)  ?  PATIENT HAS A HISTORY OF CANCER AND HAS BEEN TOLD TO NEVER TAKE ANYTHING CONTAINING ESTROGEN, AS IT MIGHT CAUSE A RECURRENCE  ?  Shrimp [Shellfish Allergy] Nausea And Vomiting  ? ? ?Medications Reviewed Today   ? ? Reviewed by Edythe Clarity, RPH (Pharmacist) on 05/18/21 at 1035  Med List Status: <None>  ? ?Medication Order Cyndie Chime

## 2021-05-15 NOTE — Patient Instructions (Addendum)
Call [phone] when we hang-up today. ?Increase Lexapro (escitalopram) to 20 mg daily.  ?

## 2021-05-17 ENCOUNTER — Telehealth: Payer: Self-pay | Admitting: Pharmacist

## 2021-05-17 NOTE — Progress Notes (Signed)
? ? ?Chronic Care Management ?Pharmacy Assistant  ? ?Name: Felicia [name]  MRN: [id number] DOB: 11/20/1950 ? ?Reason for Encounter: Chart Review For Initial Visit With Clinical Pharmacist ?  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 ?Primary concerns for visit include: ?  ? ?Recent office visits:  ?05/15/2021 [facility] (PCP) Allwardt, Randa Evens, PA-C; Call [phone] when we hang up today, increase Lexapro to 20 mg. ? ?04/19/2021 [facility] (PCP) Allwardt, Alyssa M, PA-C; Prednisone 50 mg 1 tab po x 5 days to help with hip pain ? ?Recent consult visits:  ?05/04/2021 [facility] (Cardiology) Nahser, [name], MD; no medication changes indicated. ? ?Hospital visits:  ?None in previous 6 months ? ?Medications: ?Outpatient Encounter Medications as of 05/17/2021  ?Medication Sig  ? ACCU-CHEK GUIDE test strip 1 each by Other route as needed.  ? albuterol (VENTOLIN HFA) 108 (90 Base) MCG/ACT inhaler Inhale 2 puffs into the lungs every 6 (six) hours as needed for wheezing or shortness of breath.  ? anastrozole (ARIMIDEX) 1 MG tablet Take 1 tablet (1 mg total) by mouth daily.  ? Ascorbic Acid (VITAMIN C PO) Take 1 tablet by mouth daily.  ? aspirin EC 81 MG tablet Take 81 mg by mouth daily.  ? atorvastatin (LIPITOR) 40 MG tablet TAKE 1 TABLET BY MOUTH EVERY DAY  ? bisoprolol (ZEBETA) 5 MG tablet TAKE 1 TABLET BY MOUTH EVERY DAY  ? Calcium Citrate-Vitamin D (CALCIUM + D PO) Take 1 tablet by mouth daily with supper.  ? CINNAMON PO Take 1 capsule by mouth daily with supper.  ? diclofenac sodium (VOLTAREN) 1 % GEL Apply 2-4 g topically 4 (four) times daily as needed (as directed for pain).  ? escitalopram (LEXAPRO) 10 MG tablet Take 1 tablet (10 mg total) by mouth at bedtime.  ? fenofibrate (TRICOR) 145 MG tablet Take 1 tablet (145 mg total) by mouth daily.  ? Ferrous Sulfate (IRON PO) Take 1 tablet by mouth daily. alternates days:1 tablet one day and 2 tablets the  next day  ? fluticasone furoate-vilanterol (BREO ELLIPTA) 100-25 MCG/ACT AEPB Inhale 1 puff into the lungs daily.  ? gabapentin (NEURONTIN) 300 MG capsule Take 1 capsule (300 mg total) by mouth at bedtime.  ? GLIPIZIDE XL 10 MG 24 hr tablet Take 2 tablets (20 mg total) by mouth daily.  ? levothyroxine (SYNTHROID) 137 MCG tablet 1 TABLET IN THE MORNING ON AN EMPTY STOMACH ORALLY ONCE A DAY 90  ? loratadine (CLARITIN) 10 MG tablet Take 1 tablet (10 mg total) by mouth daily as needed for allergies.  ? metFORMIN (GLUCOPHAGE) 500 MG tablet Take 1 tablet (500 mg total) by mouth 2 (two) times daily. Patient takes 1 tablet in AM and 2 tablets in PM  ? metroNIDAZOLE (METROGEL) 1 % gel Apply 1 application topically as needed (as directed to affected area). (Patient not taking: Reported on 05/15/2021)  ? Multiple Vitamins-Calcium (ONE-A-DAY WOMENS PO) Take 1 tablet by mouth daily.  ? nitroGLYCERIN (NITROSTAT) 0.4 MG SL tablet Place 1 tablet (0.4 mg total) under the tongue every 5 (five) minutes as needed for chest pain.  ? omeprazole (PRILOSEC) 40 MG capsule Take 1 capsule (40 mg total) by mouth daily with supper.  ? prochlorperazine (COMPAZINE) 10 MG tablet TAKE 1 TABLET BY MOUTH BEFORE MEALS AND AT BEDTIME ON DAYS 2 AND 3 AFTER CHEMOTHERAPY (COUNTING CHEMOTHERAPY DAY AS DAY 1) AFTER THAT MAY TAKE AS NEEDED  ?  rOPINIRole (REQUIP) 0.5 MG tablet TAKE 1 TABLET BY MOUTH EVERY DAY 1 TO 3 HOURS BEFORE BEDTIME  ? Tiotropium Bromide Monohydrate (SPIRIVA RESPIMAT) 2.5 MCG/ACT AERS Inhale 2 puffs into the lungs daily.  ? tiZANidine (ZANAFLEX) 4 MG tablet Take 4 mg by mouth every 8 (eight) hours as needed for muscle spasms. As needed  ? traMADol (ULTRAM) 50 MG tablet Take 1 tablet (50 mg total) by mouth every 6 (six) hours as needed for moderate pain.  ? TRULICITY 1.5 AL/9.3XT SOPN INJECT 1.5 MG INTO THE SKIN ONCE A WEEK.  ? TURMERIC PO Take 1 capsule by mouth daily with supper.  ? ?No facility-administered encounter medications on file  as of 05/17/2021.  ? ?Current Medications: ?Fenofibrate 145 mg last filled 05/10/2021 90 DS ?Nitroglycerin 0.4 mg last filled 05/04/2021  ?Ropinirole 0.5 mg ?Bisoprolol 5 mg last filled 04/27/2021 90 DS ?Trulicity 1.5 KW/4.0 mL last filled 05/12/2021 28 DS ?Omeprazole 40 mg last filled 05/07/2021 90 DS ?Escitalopram 10 mg last filled 03/05/2021 90 DS ?Levothyroxine 137 mcg last filled 03/12/2021 90 DS ?Metformin 500 mg last filled 03/12/2021 90 DS ?Glipizide XL 10 mg last filled 03/29/2021 90 DS ?Breo Ellipta last filled 02/28/2021 30 DS ?Loratadine 10 mg last filled 04/26/2021 90 DS ?Spiriva Respimat last filled 01/23/2021 30 DS ?Anastrozole 1 mg last filled 05/12/2021 90 DS ?Atorvastatin 40 mg last filled 04/03/2021 90 DS ?Albuterol 108 mcg last filled 08/07/2020 25 DS ?Prochlorperazine 10 mg last filled 05/04/2020 30 DS ?Tramadol 50 mg ?Metronidazole 1% gel last filled 04/20/2020 7 DS ?Gabapentin 300 mg last filled 02/28/2021 90 DS ?Ferrous Sulfate ?Tizanidine 4 mg ?Accu-Chek Guide test strip ?Calcium + D ?Vitamin C ?One-A-Day Womens ?Turmeric PO ?Cinnamon PO ?Voltaren 1% gel ?Aspirin 81 mg ?Jardiance 25 mg last filled 04/02/2021 90 DS ? ?Patient Questions: ?Any changes in your medications or health? ? ?Any side effects from any medications?  ? ?Do you have any symptoms or problems not managed by your medications? ? ?Any concerns about your health right now? ? ?Has your provider asked that you check blood pressure, blood sugar, or follow special diet at home? ? ?Do you get any type of exercise on a regular basis? ? ?Can you think of a goal you would like to reach for your health? ? ?Do you have any problems getting your medications? ? ?Is there anything that you would like to discuss during the appointment?  ? ?Please bring medications and supplements to appointment ? ?**Unsuccessful attempt at reaching patient to complete this call.** ? ?Care Gaps: ?Medicare Annual Wellness: Completed 04/06/2021 ?Ophthalmology  Exam: Overdue - never done ?Foot Exam: Overdue - never done ?Hemoglobin A1C: 9.1% on 02/13/2021 ?Colonoscopy: Next due on 10/06/2023 ?Dexa Scan: Completed ?Mammogram: Ordered on 11/16/2020 ? ?Future Appointments  ?Date Time Provider [place]  ?05/18/2021  9:00 AM [facility] CCM PHARMACIST [facility]  ?07/16/2021  3:00 PM Collie Siad A, PTA [facility] None  ?07/18/2021 11:00 AM [facility] None  ?07/18/2021 11:30 AM Nicholas Lose, MD [facility] None  ?08/10/2021  7:30 AM [facility]  ?04/19/2022  1:30 PM [facility] HEALTH COACH [facility]  ? ?Star Rating Drugs: ?Atorvastatin 40 mg last filled 04/03/2021 90 DS ? ?April D Calhoun, [place] ?Clinical Pharmacist Assistant ?[phone] ?

## 2021-05-18 ENCOUNTER — Ambulatory Visit (INDEPENDENT_AMBULATORY_CARE_PROVIDER_SITE_OTHER): Payer: Medicare Other | Admitting: Pharmacist

## 2021-05-18 DIAGNOSIS — F32A Depression, unspecified: Secondary | ICD-10-CM

## 2021-05-18 DIAGNOSIS — I10 Essential (primary) hypertension: Secondary | ICD-10-CM

## 2021-05-18 DIAGNOSIS — E785 Hyperlipidemia, unspecified: Secondary | ICD-10-CM

## 2021-05-18 DIAGNOSIS — E118 Type 2 diabetes mellitus with unspecified complications: Secondary | ICD-10-CM

## 2021-05-18 NOTE — Patient Instructions (Addendum)
Visit Information ? ? Goals Addressed   ? ?  ?  ?  ?  ? This Visit's Progress  ?  Track and Manage My Symptoms-Depression     ?  Timeframe:  Long-Range Goal ?Priority:  High ?Start Date:   05/18/21                          ?Expected End Date:  11/18/21                    ? ?Follow Up Date 08/18/21  ?  ?- develop a personal safety plan ?- exercise at least 2 to 3 times per week ?- have a plan for how to handle bad days ?- spend time or talk with others at least 2 to 3 times per week  ?  ?Why is this important?   ?Keeping track of your progress will help your treatment team find the right mix of medicine and therapy for you.  ?Write in your journal every day.  ?Day-to-day changes in depression symptoms are normal. It may be more helpful to check your progress at the end of each week instead of every day.   ?  ?Notes:  ?  ? ?  ? ?Patient Care Plan: General Pharmacy (Adult)  ?  ? ?Problem Identified: CAD, Emphysema, Type II DM, Hypothyroidism, HLD, Depression   ?Priority: High  ?Onset Date: 05/18/2021  ?  ? ?Long-Range Goal: Patient-Specific Goal   ?Start Date: 05/18/2021  ?Expected End Date: 11/18/2021  ?This Visit's Progress: On track  ?Priority: High  ?Note:   ?Current Barriers:  ?Unable to achieve control of glucose, lipids  ? ?Pharmacist Clinical Goal(s):  ?Patient will achieve improvement in A1c, LDL, TG as evidenced by labs through collaboration with PharmD and provider.  ? ?Interventions: ?1:1 collaboration with Allwardt, Randa Evens, PA-C regarding development and update of comprehensive plan of care as evidenced by provider attestation and co-signature ?Inter-disciplinary care team collaboration (see longitudinal plan of care) ?Comprehensive medication review performed; medication list updated in electronic medical record ? ?Hyperlipidemia: (LDL goal < 100) ?-Uncontrolled ?-Current treatment: ?Atorvastatin '40mg'$  daily Appropriate, Query effective, ,  ?Fenofibrate 145 mg Appropriate, Query effective, ,  ?-Medications  previously tried: none noted  ?-Current dietary patterns: see DM ?-Current exercise habits: minimal ?-Educated on Cholesterol goals;  ?Benefits of statin for ASCVD risk reduction; ?Importance of limiting foods high in cholesterol; ?Importance of adherence with meds ?-Recommended to continue current medication ?She has FU with Nahser for lipid panel in a few months.  If current medication is not working to bring down her liipds, TG's would recommend addition of something like Repatha.  She would also be willing to speak with nutrition for help with this as well as her DM.  ? ?Diabetes (A1c goal <8%) ?-Controlled ?-Current medications: ?Trulicity 1.'5mg'$  once weekly Appropriate, Query effective, ,  ?Metformin '500mg'$  1 tablet AM and 2 tablets PM Appropriate, Query effective, ,  ?Glipizide XL '10mg'$  Appropriate, Query effective, ,  ?Jardiance '25mg'$  Appropriate, Query effective, ,  ?-Medications previously tried: none noted  ?-Current home glucose readings ?fasting glucose: not checking ?post prandial glucose: not checking ?-Denies hypoglycemic/hyperglycemic symptoms ?-Current meal patterns:  ?breakfast: hot tea, toast with smart balance butter, pancakes  ?lunch: roast beef sub (big one for lunch and supper)  ?dinner: hamburger, mashed potatoes, stir fry veggies ?snacks: chocolate, loves sweets ?drinks: diet coke/pepsi, sugar free tea, water ?-Current exercise: minimal ?-Educated on A1c and blood  sugar goals; ?Complications of diabetes including kidney damage, retinal damage, and cardiovascular disease; ?Prevention and management of hypoglycemic episodes; ?Benefits of routine self-monitoring of blood sugar; ?Carbohydrate counting and/or plate method ?-Counseled to check feet daily and get yearly eye exams ?-Recommended to continue current medication ?-She mentions she would be open to talking to nutrition.  Most of her nutrition is carbohydrate heavy, also loves sweets.  Social work could get her in contact with meals on  wheels or similar service to help provide healthier meals at a convenience.  She is also now without a car as it just got re-possessed. ?She needs updated A1c as she has not been checking glucose at home.  Unfortunately the next step in her care would be insulin. ? ? ?Depression/Anxiety (Goal: Reduce symptoms) ?-Not ideally controlled ?-Current treatment: ?Escitalopram '20mg'$  Appropriate, Query effective, , ?-Medications previously tried/failed: Citalopram ?-PHQ9:  ? ?  04/06/2021  ?  1:25 PM 01/18/2021  ?  1:52 PM 09/08/2019  ? 12:36 PM  ?PHQ9 SCORE ONLY  ?PHQ-9 Total Score 1 1 0  ? ?-GAD7:  ?   ? View : No data to display.  ?  ?  ?  ?-Educated on Benefits of medication for symptom control ?Benefits of speaking with family regularly. ?She states increased of Lexapro is helping.  Encouraged her to reach out for help to providers if suicidal ideation arises or symptoms worsen.  I think having someone talk to her regularly and encourage her to make small changes would be very beneficial for health and mental state. ?-Recommended to continue current medication ?Needs FU with PCP for evaluation. ? ?Hypothyroidism (Goal: Maintain TSHa) ?-Controlled ?-Current treatment  ?Levothyroxine 148mg Appropriate, Effective, Safe, Accessible ?-Medications previously tried: none noted  ?-Recommended to continue current medication ?Takes appropriately and TSH is normal ? ?Patient Goals/Self-Care Activities ?Patient will:  ?- focus on medication adherence by pill packs ?check glucose daily, document, and provide at future appointments ?engage in dietary modifications by cutting back on carbs (bread( ? ? ? ?Follow Up Plan: The care management team will reach out to the patient again over the next 180 days.  ?  ? ? ?Ms. WBadiawas given information about Chronic Care Management services today including:  ?CCM service includes personalized support from designated clinical staff supervised by her physician, including individualized plan of care  and coordination with other care providers ?24/7 contact phone numbers for assistance for urgent and routine care needs. ?Standard insurance, coinsurance, copays and deductibles apply for chronic care management only during months in which we provide at least 20 minutes of these services. Most insurances cover these services at 100%, however patients may be responsible for any copay, coinsurance and/or deductible if applicable. This service may help you avoid the need for more expensive face-to-face services. ?Only one practitioner may furnish and bill the service in a calendar month. ?The patient may stop CCM services at any time (effective at the end of the month) by phone call to the office staff. ? ?Patient agreed to services and verbal consent obtained.  ? ?The patient verbalized understanding of instructions, educational materials, and care plan provided today and agreed to receive a mailed copy of patient instructions, educational materials, and care plan.  ?Telephone follow up appointment with pharmacy team member scheduled for: 1 months ? ?CEdythe Clarity [facility] ?CBeverly Milch PharmD ?Clinical Pharmacist  ?LOrvan July?([phone] ?

## 2021-05-22 ENCOUNTER — Other Ambulatory Visit: Payer: Self-pay

## 2021-05-22 MED ORDER — BISOPROLOL FUMARATE 5 MG PO TABS: 5.0000 mg | ORAL_TABLET | Freq: Every day | ORAL | 0 refills | Status: DC

## 2021-05-22 MED ORDER — ATORVASTATIN CALCIUM 40 MG PO TABS: 40.0000 mg | ORAL_TABLET | Freq: Every day | ORAL | 0 refills | Status: DC

## 2021-05-22 MED ORDER — FENOFIBRATE 145 MG PO TABS: 145.0000 mg | ORAL_TABLET | Freq: Every day | ORAL | 0 refills | Status: DC

## 2021-05-23 ENCOUNTER — Other Ambulatory Visit: Payer: Self-pay | Admitting: *Deleted

## 2021-05-23 ENCOUNTER — Telehealth: Payer: Self-pay | Admitting: Pulmonary Disease

## 2021-05-23 MED ORDER — LORATADINE 10 MG PO TABS: 10.0000 mg | ORAL_TABLET | Freq: Every day | ORAL | 11 refills | Status: DC | PRN

## 2021-05-23 MED ORDER — ANASTROZOLE 1 MG PO TABS: 1.0000 mg | ORAL_TABLET | Freq: Every day | ORAL | 4 refills | Status: DC

## 2021-05-23 MED ORDER — GABAPENTIN 300 MG PO CAPS: 300.0000 mg | ORAL_CAPSULE | Freq: Every day | ORAL | 4 refills | Status: DC

## 2021-05-23 MED ORDER — SPIRIVA RESPIMAT 2.5 MCG/ACT IN AERS: 2.0000 | INHALATION_SPRAY | Freq: Every day | RESPIRATORY_TRACT | 11 refills | Status: DC

## 2021-05-23 MED ORDER — ALBUTEROL SULFATE HFA 108 (90 BASE) MCG/ACT IN AERS: 2.0000 | INHALATION_SPRAY | Freq: Four times a day (QID) | RESPIRATORY_TRACT | 2 refills | Status: DC | PRN

## 2021-05-23 MED ORDER — FLUTICASONE FUROATE-VILANTEROL 100-25 MCG/ACT IN AEPB: 1.0000 | INHALATION_SPRAY | Freq: Every day | RESPIRATORY_TRACT | 11 refills | Status: DC

## 2021-05-23 NOTE — Telephone Encounter (Signed)
All of the requested rxs were sent to [facility].  ?

## 2021-05-24 ENCOUNTER — Telehealth: Payer: Self-pay

## 2021-05-24 ENCOUNTER — Telehealth: Payer: Self-pay | Admitting: Licensed Clinical Social Worker

## 2021-05-24 ENCOUNTER — Telehealth: Payer: Medicare Other

## 2021-05-24 DIAGNOSIS — Z5986 Financial insecurity: Secondary | ICD-10-CM

## 2021-05-24 DIAGNOSIS — Z5982 Transportation insecurity: Secondary | ICD-10-CM

## 2021-05-24 NOTE — Chronic Care Management (AMB) (Signed)
? ?   Clinical Social Work  ?Care Management  ? Phone Outreach  ? ? ?05/24/2021 ?Name: Felicia [name] MRN: [id number] DOB: 1950-11-15 ? ?Felicia [name] is a 71 y.o. year old female who is a primary care patient of Allwardt, Alyssa M, PA-C .  ? ?Reason for referral: Emergency planning/management officer  and [place] and Resources.   ? ? ?CCM LCSW reached out to patient today by phone to introduce self, assess needs and offer Care Management services and interventions.    Telephone outreach was unsuccessful. A HIPPA compliant phone message was left for the patient providing contact information and requesting a return call.  ? ?Plan: ?CCM LCSW will wait for return call. If no return call is received, Will route chart to Care Guide to see if patient would like to reschedule phone appointment  and  ?Send referral to care guide for community resources (meals, financial insecurity and transportation needs) ? ?Review of patient status, including review of consultants reports, relevant laboratory and other test results, and collaboration with appropriate care team members and the patient's provider was performed as part of comprehensive patient evaluation and provision of care management services.   ? ?Casimer Lanius, LCSW ?Licensed Clinical Social Worker [facility]  ?[place] ?[phone]  ? ? ?  ? ? ?  ?

## 2021-05-24 NOTE — Telephone Encounter (Signed)
? ?  Telephone encounter was:  Successful.  ?05/24/2021 ?Name: Felicia [name] MRN: [id number] DOB: 27-Aug-1950 ? ?[name] is a 71 y.o. year old female who is a primary care patient of Allwardt, Randa Evens, PA-C . The community resource team was consulted for assistance with Transportation Needs  and Food Insecurity ? ?Care guide performed the following interventions: Patient provided with information about care guide support team and interviewed to confirm resource needs.Meals on wheels referral will be put in and transportation is with insuance ? ?Follow Up Plan:  Care guide will follow up with patient by phone over the next day ? ? ? ?[name] ?Care Guide, Embedded Care Coordination ?[place], Care Management  ?[phone] ?[address], [place], [place] [address] ?Phone: [phone] ?Email: [name] ? ?  ?

## 2021-05-28 ENCOUNTER — Ambulatory Visit: Payer: Medicare Other | Admitting: Physician Assistant

## 2021-05-29 ENCOUNTER — Other Ambulatory Visit: Payer: Self-pay

## 2021-05-29 DIAGNOSIS — R079 Chest pain, unspecified: Secondary | ICD-10-CM

## 2021-05-29 MED ORDER — TRULICITY 1.5 MG/0.5ML ~~LOC~~ SOAJ: 1.5000 mg | SUBCUTANEOUS | 2 refills | Status: DC

## 2021-05-29 MED ORDER — GLIPIZIDE XL 10 MG PO TB24: 20.0000 mg | ORAL_TABLET | Freq: Every day | ORAL | 3 refills | Status: DC

## 2021-05-29 MED ORDER — ESCITALOPRAM OXALATE 10 MG PO TABS
10.0000 mg | ORAL_TABLET | Freq: Every day | ORAL | 1 refills | Status: DC
Start: 2021-05-29 — End: 2021-05-30

## 2021-05-29 MED ORDER — OMEPRAZOLE 40 MG PO CPDR: 40.0000 mg | DELAYED_RELEASE_CAPSULE | Freq: Every day | ORAL | 1 refills | Status: DC

## 2021-05-29 MED ORDER — LEVOTHYROXINE SODIUM 137 MCG PO TABS: ORAL_TABLET | ORAL | 1 refills | Status: DC

## 2021-05-29 MED ORDER — ROPINIROLE HCL 0.5 MG PO TABS: ORAL_TABLET | ORAL | 0 refills | Status: DC

## 2021-05-29 MED ORDER — METFORMIN HCL 500 MG PO TABS: 500.0000 mg | ORAL_TABLET | Freq: Two times a day (BID) | ORAL | 1 refills | Status: DC

## 2021-05-30 ENCOUNTER — Other Ambulatory Visit: Payer: Self-pay | Admitting: Physician Assistant

## 2021-05-30 MED ORDER — ESCITALOPRAM OXALATE 20 MG PO TABS: 20.0000 mg | ORAL_TABLET | Freq: Every day | ORAL | 1 refills | Status: DC

## 2021-05-31 ENCOUNTER — Ambulatory Visit (INDEPENDENT_AMBULATORY_CARE_PROVIDER_SITE_OTHER): Payer: Medicare Other | Admitting: Physician Assistant

## 2021-05-31 ENCOUNTER — Encounter: Payer: Self-pay | Admitting: Physician Assistant

## 2021-05-31 ENCOUNTER — Ambulatory Visit: Payer: Medicare Other | Admitting: Licensed Clinical Social Worker

## 2021-05-31 VITALS — BP 117/71 | HR 72 | Temp 98.0°F | Ht 64.0 in | Wt 167.2 lb

## 2021-05-31 DIAGNOSIS — F32A Depression, unspecified: Secondary | ICD-10-CM

## 2021-05-31 DIAGNOSIS — E119 Type 2 diabetes mellitus without complications: Secondary | ICD-10-CM

## 2021-05-31 DIAGNOSIS — F419 Anxiety disorder, unspecified: Secondary | ICD-10-CM

## 2021-05-31 DIAGNOSIS — R519 Headache, unspecified: Secondary | ICD-10-CM | POA: Diagnosis not present

## 2021-05-31 LAB — POCT GLYCOSYLATED HEMOGLOBIN (HGB A1C): Hemoglobin A1C: 9.5 % — AB (ref 4.0–5.6)

## 2021-05-31 NOTE — Progress Notes (Signed)
Subjective:    Patient ID: Felicia [name], female    DOB: 1950/07/03, 71 y.o.   MRN: [id number]  Chief Complaint  Patient presents with   Depression    Pt says that she is doing a lot better on 20 mg Lexapro. She is requesting a refill.     HPI Patient is in today for f/up on anxiety and depression. Increased lexapro to 20 mg since last phone visit on 05/15/21 Feels better, says depression still creeps in sometimes. Says she's just waiting for her cancer to show up again.  Sleeping well, takes Tylenol PM Working with Education officer, museum to get connected with counseling. Denies any suicidal plans or ideation  Patient also needing follow-up on type 2 diabetes.  She needs her hemoglobin A1c checked today.  Patient is also requesting something for "these headaches" which have been described in previous notes. MRI brain 02/15/21 negative. Mostly frontal, tension like headaches. Tylenol not helping much.   Past Medical History:  Diagnosis Date   Anemia    Anxiety    Breast cancer ([place]) 09/2019   left breast IMC   CAD (coronary artery disease)    a. 3/2007s/p DES to the LAD [facility]); b. 01/2017 MV: EF 80%, small, mild apical ant defect w/o ischemia (felt to be breast atten). Low risk.   Depression    DM (diabetes mellitus) ([place])    Essential hypertension    GERD (gastroesophageal reflux disease)    Headache    secondary to neck surgery per patient   High triglycerides    Hyperlipidemia    Hypothyroid    OA (osteoarthritis)    right knee,hands   Overflow incontinence    PONV (postoperative nausea and vomiting)    s/p gallbladder    RLS (restless legs syndrome)    Urinary urgency    Uterine cancer ([place]) dx'd 2014    Past Surgical History:  Procedure Laterality Date   BREAST LUMPECTOMY WITH RADIOACTIVE SEED AND SENTINEL LYMPH NODE BIOPSY Left 10/07/2019   Procedure: LEFT BREAST LUMPECTOMY WITH RADIOACTIVE SEED AND SENTINEL LYMPH NODE BIOPSY;  Surgeon: Rolm Bookbinder, MD;   Location: [facility];  Service: General;  Laterality: Left;   CORONARY STENT PLACEMENT     CORONARY/GRAFT ACUTE MI REVASCULARIZATION     FINGER ARTHRODESIS Right 06/11/2019   Procedure: ARTHRODESIS INDEX FINGER DISTAL PHALANGEAL JOINT;  Surgeon: [name], MD;  Location: [facility];  Service: Orthopedics;  Laterality: Right;   FOOT SURGERY Left    LEFT HEART CATH AND CORONARY ANGIOGRAPHY N/A 02/16/2018   Procedure: LEFT HEART CATH AND CORONARY ANGIOGRAPHY;  Surgeon: Lorretta Harp, MD;  Location: [facility];  Service: Cardiovascular;  Laterality: N/A;   LEFT HEART CATH AND CORONARY ANGIOGRAPHY N/A 05/01/2020   Procedure: LEFT HEART CATH AND CORONARY ANGIOGRAPHY;  Surgeon: Lorretta Harp, MD;  Location: [facility];  Service: Cardiovascular;  Laterality: N/A;   PORTACATH PLACEMENT Right 11/23/2019   Procedure: INSERTION PORT-A-CATH WITH ULTRASOUND GUIDANCE;  Surgeon: Rolm Bookbinder, MD;  Location: [facility];  Service: General;  Laterality: Right;   REPLACEMENT TOTAL KNEE Right    SPINE SURGERY     TOTAL ABDOMINAL HYSTERECTOMY     FOR UTERUS CANCER   TOTAL HIP ARTHROPLASTY Left 2015    Family History  Problem Relation Age of Onset   AAA (abdominal aortic aneurysm) Mother    Diabetes Father    Alzheimer's disease Father    Throat  cancer Sister    Breast cancer Cousin     Social History   Tobacco Use   Smoking status: Never   Smokeless tobacco: Never  Vaping Use   Vaping Use: Never used  Substance Use Topics   Alcohol use: Yes    Comment: OCCASIONALLY   Drug use: No     Allergies  Allergen Reactions   Chloraprep One Step [Chlorhexidine Gluconate] Rash   Estrogens Other (See Comments)    PATIENT HAS A HISTORY OF CANCER AND HAS BEEN TOLD TO NEVER TAKE ANYTHING CONTAINING ESTROGEN, AS IT MIGHT CAUSE A RECURRENCE   Shrimp [Shellfish Allergy] Nausea And Vomiting    Review of Systems NEGATIVE UNLESS  OTHERWISE INDICATED IN HPI      Objective:     BP 117/71   Pulse 72   Temp 98 F (36.7 C) (Temporal)   Ht '5\' 4"'$  (1.626 m)   Wt 167 lb 3.2 oz (75.8 kg)   SpO2 97%   BMI 28.70 kg/m   Wt Readings from Last 3 Encounters:  05/31/21 167 lb 3.2 oz (75.8 kg)  05/15/21 172 lb 13.5 oz (78.4 kg)  05/04/21 172 lb 12.8 oz (78.4 kg)    BP Readings from Last 3 Encounters:  05/31/21 117/71  05/04/21 136/74  04/19/21 119/79     Physical Exam Constitutional:      General: She is not in acute distress.    Appearance: Normal appearance. She is not toxic-appearing.  Cardiovascular:     Rate and Rhythm: Normal rate and regular rhythm.     Pulses: Normal pulses.     Heart sounds: Normal heart sounds.  Pulmonary:     Effort: Pulmonary effort is normal.     Breath sounds: Normal breath sounds.  Neurological:     General: No focal deficit present.     Mental Status: She is alert.     Cranial Nerves: No cranial nerve deficit.     Motor: No weakness.     Gait: Gait normal.  Psychiatric:        Mood and Affect: Mood normal.        Behavior: Behavior normal.        Thought Content: Thought content normal.        Judgment: Judgment normal.       Assessment & Plan:   Problem List Items Addressed This Visit       Endocrine   Type 2 diabetes mellitus without complication, without long-term current use of insulin ([place])   Relevant Orders   POCT HgB A1C (Completed)     Other   Anxiety and depression - Primary   Frequent headaches     No orders of the defined types were placed in this encounter.    No follow-ups on file.  This note was prepared with assistance of Systems analyst. Occasional wrong-word or sound-a-like substitutions may have occurred due to the inherent limitations of voice recognition software.   Witt Plitt M Khalani Novoa, PA-C

## 2021-05-31 NOTE — Patient Instructions (Addendum)
Great to see you today!   Continue Lexapro 20 mg Consider counseling - let me know your thoughts on this  Hemoglobin A1c is up to 9.5 today. We need to get better control of your glucose. Headaches are frequent as well.... let me talk with Gerald Stabs and come up with plan for you. I will call you back in the next week.

## 2021-05-31 NOTE — Patient Instructions (Signed)
Visit Information   Thank you for taking time to visit with me today. Please don't hesitate to contact me if I can be of assistance to you before our next scheduled telephone appointment.  Following are the goals we discussed today: Connecting you with counseling   Our next appointment is by telephone on June 1st at 1:30  Casimer Lanius, [place] Licensed Clinical Social Worker [facility]  [place] [phone]  Please call the care guide team at [phone] if you need to cancel or reschedule your appointment.   If you are experiencing a Mental Health or [place] or need someone to talk to, please call the Suicide and Crisis Lifeline: 988 call the [place] National Suicide Prevention Lifeline: 908-605-2304 or TTY: 867-429-4194 TTY (734) 862-4815) to talk to a trained counselor call 1-800-273-TALK (toll free, 24 hour hotline)   Following is a copy of your full care plan:  Care Plan : LCSW Plan of Care  Updates made by Maurine Cane, LCSW since 05/31/2021 12:00 AM     Problem: Coping Skills      Goal: Coping Skills Enhanced   Start Date: 05/31/2021  This Visit's Progress: On track  Priority: High  Note:   Current Barriers:  Disease Management support and education needs related to Depression: anxiety Lacks knowledge of how to connect   CSW Clinical Goal(s):  Patient  will explore community resource options for unmet needs related to:  Depression   through collaboration with [facility] representative, provider, and care team.   Interventions: 1:1 collaboration with primary care provider regarding development and update of comprehensive plan of care as evidenced by provider attestation and co-signature Inter-disciplinary care team collaboration (see longitudinal plan of care) Evaluation of current treatment plan related to  self management and patient's adherence to plan as established by provider Review resources, discussed options and  provided patient information about  Department of Social Services ( has food stamps ) Transportation provided by insurance provider (uses for medical appointments) Referral to care guide (for resources)   Mental Health:  (Status: New goal.) Evaluation of current treatment plan related to Depression: anxiety Motivational Interviewing employed Solution-Focused Strategies employed:  Active listening / Reflection utilized  Problem Solving [place] strategies reviewed Provided psychoeducation for mental health needs  Participation in counseling encouraged  Explored various therapy options with patient and assisted with making calls Collaborated with [facility] to schedule counseling appointment for patient  Task & activities to accomplish goals: Continue with compliance of taking medication prescribed by Doctor Complete paperwork in your e-mail from [facility] call if you have difficulty with completing it Keep appointment with [facility] May 24th at 11:00   [address] [place], [place] [address]    [phone]     Consent to CCM Services: Ms. Driskill was given information about Chronic Care Management services including:  CCM service includes personalized support from designated clinical staff supervised by her physician, including individualized plan of care and coordination with other care providers 24/7 contact phone numbers for assistance for urgent and routine care needs. Service will only be billed when office clinical staff spend 20 minutes or more in a month to coordinate care. Only one practitioner may furnish and bill the service in a calendar month. The patient may stop CCM services at any time (effective at the end of the month) by phone call to the office staff. The patient will be responsible for cost sharing (  co-pay) of up to 20% of the service fee (after annual deductible is met).  Patient agreed to services and verbal consent  obtained.

## 2021-05-31 NOTE — Chronic Care Management (AMB) (Signed)
Chronic Care Management   Clinical Social Work Note  05/31/2021 Name: Felicia [name] MRN: [id number] DOB: [birth year]  Felicia [name] is a 71 y.o. year old female who is a primary care patient of Allwardt, Randa Evens, PA-C. The CCM team was consulted to assist the patient with chronic disease management and/or care coordination needs related to: Mental Health Counseling and Resources.   Engaged with patient by telephone for initial visit in response to provider referral for social work chronic care management and care coordination services.   Consent to Services:  The patient was given information about Chronic Care Management services, agreed to services, and gave verbal consent prior to initiation of services.  Please see initial visit note for detailed documentation.   Patient agreed to services and consent obtained.   Summary: Assessed patient's previous and current treatment, coping skills, support system and barriers to care. She is currently experiencing symptoms of  anxiety and depression which seems to be exacerbated by grieving the loss of her, job, car and health concerns.  ..  See Care Plan below for interventions and patient self-care actives.  Recommendation: Patient may benefit from, and is in agreement to keep initial appointment made today with [facility] [place] on May 24th at 11:00.   Follow up Plan: Patient would like continued follow-up from CCM LCSW.  per patient's request will follow up in 2 weeks.  Will call office if needed prior to next encounter.   Assessment: Review of patient past medical history, allergies, medications, and health status, including review of relevant consultants reports was performed today as part of a comprehensive evaluation and provision of chronic care management and care coordination services.     SDOH (Social Determinants of Health) assessments and interventions performed:  SDOH Interventions    Flowsheet Row Most Recent Value   SDOH Interventions   SDOH Interventions for the Following Domains Depression, Stress  Food Insecurity Interventions Intervention Not Indicated  [currently has food stamps]  Stress Interventions Provide Counseling  Transportation Interventions Payor Benefit  [uses insurance transportatio with [employer]]  Depression Interventions/Treatment  Counseling  [connected with therapist]        Advanced Directives Status: Not addressed in this encounter.  CCM Care Plan Conditions to be addressed/monitored: Anxiety and Depression;   Care Plan : LCSW Plan of Care  Updates made by Maurine Cane, LCSW since 05/31/2021 12:00 AM     Problem: Coping Skills      Goal: Coping Skills Enhanced   Start Date: 05/31/2021  This Visit's Progress: On track  Priority: High  Note:   Current Barriers:  Disease Management support and education needs related to Depression: anxiety Lacks knowledge of how to connect   CSW Clinical Goal(s):  Patient  will explore community resource options for unmet needs related to:  Depression   through collaboration with [place] representative, provider, and care team.   Interventions: 1:1 collaboration with primary care provider regarding development and update of comprehensive plan of care as evidenced by provider attestation and co-signature Inter-disciplinary care team collaboration (see longitudinal plan of care) Evaluation of current treatment plan related to  self management and patient's adherence to plan as established by provider Review resources, discussed options and provided patient information about  Department of Social Services ( has food stamps ) Transportation provided by insurance provider (uses for medical appointments) Referral to care guide (for resources)   Mental Health:  (Status: New goal.) Evaluation of current treatment plan related to Depression: anxiety  Motivational Interviewing employed Solution-Focused Strategies employed:  Active listening  / Reflection utilized  Problem Solving [place] strategies reviewed Provided psychoeducation for mental health needs  Participation in counseling encouraged  Explored various therapy options with patient and assisted with making calls Collaborated with [facility] to schedule counseling appointment for patient  Task & activities to accomplish goals: Continue with compliance of taking medication prescribed by Doctor Complete paperwork in your e-mail from [facility] call if you have difficulty with completing it Keep appointment with [facility]   [address] [place], [place] [address]    [place], [place] Licensed Clinical Social Worker [facility]  [facility] [place] [phone]

## 2021-06-01 NOTE — Assessment & Plan Note (Signed)
   Ongoing, no red flags, but persistent. ?stress related.  MRI negative earlier this year.  Consider increasing gabapentin to 600 mg at bedtime or 300 mg in am and 300 mg in pm  Consider neurology referral

## 2021-06-01 NOTE — Assessment & Plan Note (Signed)
   Increase metformin to 1000 mg twice daily  Continue Trulicity at 1.5 mg once weekly  Continue to work on health habits; financially struggling for food right now and she continues to work with Education officer, museum Lab Results  Component Value Date   HGBA1C 9.5 (A) 05/31/2021

## 2021-06-01 NOTE — Assessment & Plan Note (Signed)
Patient is doing better.  Continue with Lexapro 20 mg daily.  Counseling would be of great benefit to her.  She will continue to work with Education officer, museum and Nurse, adult about resources in the community.

## 2021-06-05 ENCOUNTER — Other Ambulatory Visit: Payer: Self-pay

## 2021-06-05 ENCOUNTER — Ambulatory Visit: Payer: Medicare Other | Admitting: *Deleted

## 2021-06-05 ENCOUNTER — Other Ambulatory Visit: Payer: Self-pay | Admitting: Physician Assistant

## 2021-06-05 ENCOUNTER — Encounter: Payer: Self-pay | Admitting: Neurology

## 2021-06-05 DIAGNOSIS — E119 Type 2 diabetes mellitus without complications: Secondary | ICD-10-CM

## 2021-06-05 DIAGNOSIS — G8929 Other chronic pain: Secondary | ICD-10-CM

## 2021-06-05 DIAGNOSIS — I10 Essential (primary) hypertension: Secondary | ICD-10-CM

## 2021-06-05 MED ORDER — GABAPENTIN 300 MG PO CAPS: 300.0000 mg | ORAL_CAPSULE | Freq: Two times a day (BID) | ORAL | 4 refills | Status: DC

## 2021-06-05 MED ORDER — METFORMIN HCL 500 MG PO TABS: 1000.0000 mg | ORAL_TABLET | Freq: Two times a day (BID) | ORAL | 1 refills | Status: DC

## 2021-06-05 NOTE — Chronic Care Management (AMB) (Signed)
  Care Management   Follow Up Note   06/05/2021 Name: Felicia [name] MRN: [id number] DOB: 07-02-1950   Referred by: Allwardt, Randa Evens, PA-C Reason for referral : Chronic Care Management (initial)   Successful telephone outreach to patient.   RNCM introduced self and role.  In middle of conversation, line disconnected.  Attempted another call to patient, no answer,  HIPPA compliaint voice message left.  Follow Up Plan: The care management team will reach out to the patient again over the next 30 days. To gain consent and complete initial assessment.  Hubert Azure RN, MSN RN Care Management Coordinator  [facility] [phone] [name]

## 2021-06-06 ENCOUNTER — Telehealth: Payer: Self-pay | Admitting: Physician Assistant

## 2021-06-06 NOTE — Telephone Encounter (Signed)
Upstream calling re metformin clarification-   directions are not correct per pharmacist. """"Take 2 tablets (1,000 mg total) by mouth 2 (two) times daily with a meal. Patient takes 1 tablet in AM and 2 tablets in PM - Oral""""    Please call pharmacist at [phone]

## 2021-06-06 NOTE — Telephone Encounter (Signed)
Called pharmacy back and spoke with Chasity advised note below from [place]...  Can you please reach out to patient and let her know I spent more time in her chart and at this time I think it would be wise to max out her metformin dose - meaning take 1000 mg (two tabs) twice daily with meals.  I would also encourage her to increase the gabapentin to 600 mg at bedtime or she can space it out and do 300 mg in the morning and 300 mg at night, with the hope of this helping relieve some of her headache pain.   I also wonder about referral to neurology as she has continued to suffer with these headaches for quite some time now.  Okay to place this order if she is agreeable as it may take a few months to get her in.   Patient has also been informed and agreeable to changes and referral that was placed.

## 2021-06-07 ENCOUNTER — Telehealth: Payer: Self-pay

## 2021-06-07 NOTE — Telephone Encounter (Signed)
   Telephone encounter was:  Unsuccessful.  06/07/2021 Name: Felicia [name] MRN: [id number] DOB: 1950/10/30  Unsuccessful outbound call made today to assist with:  Transportation Needs  and Food Insecurity  Outreach Attempt:  2nd Attempt  A HIPAA compliant voice message was left requesting a return call.  Instructed patient to call back at earliest convenience. [place], Care Management  [phone] [address], [place], [place] [address] Phone: [phone] Email: [email or web address]

## 2021-06-10 ENCOUNTER — Other Ambulatory Visit: Payer: Self-pay | Admitting: Physician Assistant

## 2021-06-12 ENCOUNTER — Telehealth: Payer: Self-pay

## 2021-06-12 NOTE — Telephone Encounter (Signed)
   Telephone encounter was:  Unsuccessful.  06/12/2021 Name: Akira Perusse MRN: [id number] DOB: [birth year]  Unsuccessful outbound call made today to assist with:   meals  Outreach Attempt:  3rd Attempt.  Referral closed unable to contact patient.  A HIPAA compliant voice message was left requesting a return call.  Instructed patient to call back .Follow up call. Left a message to call back at earliest convenience.  [place], Care Management  [phone] [address], [place], [place] [address] Phone: [phone] Email: [email or web address]

## 2021-06-13 DIAGNOSIS — F419 Anxiety disorder, unspecified: Secondary | ICD-10-CM

## 2021-06-13 DIAGNOSIS — E1169 Type 2 diabetes mellitus with other specified complication: Secondary | ICD-10-CM

## 2021-06-13 DIAGNOSIS — Z7984 Long term (current) use of oral hypoglycemic drugs: Secondary | ICD-10-CM

## 2021-06-13 DIAGNOSIS — E039 Hypothyroidism, unspecified: Secondary | ICD-10-CM

## 2021-06-13 DIAGNOSIS — E785 Hyperlipidemia, unspecified: Secondary | ICD-10-CM | POA: Diagnosis not present

## 2021-06-13 DIAGNOSIS — Z794 Long term (current) use of insulin: Secondary | ICD-10-CM | POA: Diagnosis not present

## 2021-06-13 DIAGNOSIS — F32A Depression, unspecified: Secondary | ICD-10-CM

## 2021-06-14 ENCOUNTER — Encounter: Payer: Self-pay | Admitting: Licensed Clinical Social Worker

## 2021-06-14 ENCOUNTER — Ambulatory Visit (INDEPENDENT_AMBULATORY_CARE_PROVIDER_SITE_OTHER): Payer: Medicare Other | Admitting: Licensed Clinical Social Worker

## 2021-06-14 DIAGNOSIS — F419 Anxiety disorder, unspecified: Secondary | ICD-10-CM

## 2021-06-14 DIAGNOSIS — F32A Depression, unspecified: Secondary | ICD-10-CM

## 2021-06-14 DIAGNOSIS — Z139 Encounter for screening, unspecified: Secondary | ICD-10-CM

## 2021-06-14 NOTE — Progress Notes (Signed)
Chronic Care Management Pharmacy Note  06/15/2021 Name:  Felicia [name] MRN:  [id number] DOB:  Oct 07, 1950  Summary: PharmD FU. Yesterday diet recall Breakfast - toast with margarine Lunch - Steak sandwich Dinner - frozen meal, salisbury steak w/ mac and cheese Snacks - ritz cheese and crackers 2 throughout the day, late night snacks cucumber Drinks - diet soda, iced tea, cup of tea with coconut sugar Desert - none  Glucose - she is not checking at home.   Recommendations/Changes made from today's visit: Start checking fasting glucose and recording - will FU in 2 weeks Limit carbs, begin some form of exercise My next recommendation for her DM would be to switch glipizide with long acting insulin.  Plan: FU 2 months   Subjective: Felicia [name] is an 71 y.o. year old female who is a primary patient of Allwardt, Alyssa M, PA-C.  The CCM team was consulted for assistance with disease management and care coordination needs.    Engaged with patient by telephone for follow up visit in response to provider referral for pharmacy case management and/or care coordination services.   Consent to Services:  The patient was given the following information about Chronic Care Management services today, agreed to services, and gave verbal consent: 1. CCM service includes personalized support from designated clinical staff supervised by the primary care provider, including individualized plan of care and coordination with other care providers 2. 24/7 contact phone numbers for assistance for urgent and routine care needs. 3. Service will only be billed when office clinical staff spend 20 minutes or more in a month to coordinate care. 4. Only one practitioner may furnish and bill the service in a calendar month. 5.The patient may stop CCM services at any time (effective at the end of the month) by phone call to the office staff. 6. The patient will be responsible for cost sharing (co-pay) of up to 20% of  the service fee (after annual deductible is met). Patient agreed to services and consent obtained.  Patient Care Team: Allwardt, Randa Evens, PA-C as PCP - General (Physician Assistant) Nahser, [name], MD as PCP - Cardiology (Cardiology) Rolm Bookbinder, MD as Consulting Physician (General Surgery) Eppie Gibson, MD as Attending Physician (Radiation Oncology) [name], MD as Consulting Physician (Rheumatology) [name], MD as Consulting Physician (Orthopedic Surgery) Madelin Rear, MD as Consulting Physician (Endocrinology) Margaretha Seeds, MD as Consulting Physician (Pulmonary Disease) Nicholas Lose, MD as Consulting Physician (Hematology and Oncology) Edythe Clarity, [facility] as Pharmacist (Pharmacist) Maurine Cane, LCSW as Social Worker (Licensed Clinical Social Worker) Shelby Mattocks, Illene Regulus, RN as [place] Management  Recent office visits:  05/15/2021 OV (PCP) Allwardt, Randa Evens, PA-C; Call [phone] when we hang up today, increase Lexapro to 20 mg.   04/19/2021 [facility] (PCP) Allwardt, Alyssa M, PA-C; Prednisone 50 mg 1 tab po x 5 days to help with hip pain   Recent consult visits:  05/04/2021 [facility] (Cardiology) Nahser, [name], MD; no medication changes indicated.     Objective:  Lab Results  Component Value Date   CREATININE 0.65 04/19/2021   BUN 16 04/19/2021   GFR 88.66 04/19/2021   GFRNONAA >60 01/18/2021   GFRAA >60 10/04/2019   NA 139 04/19/2021   K 4.7 04/19/2021   CALCIUM 10.4 04/19/2021   CO2 29 04/19/2021   GLUCOSE 211 (H) 04/19/2021    Lab Results  Component Value Date/Time   HGBA1C 9.5 (A) 05/31/2021 11:32 AM   HGBA1C 9.1 (  H) 02/05/2021 01:26 PM   HGBA1C 8.3 (H) 10/06/2020 01:56 PM   GFR 88.66 04/19/2021 12:06 PM    Last diabetic Eye exam: No results found for: HMDIABEYEEXA  Last diabetic Foot exam: No results found for: HMDIABFOOTEX   Lab Results  Component Value Date   CHOL 242 (H) 05/04/2021   HDL 49 05/04/2021    LDLCALC Comment (A) 05/04/2021   TRIG 1,034 (HH) 05/04/2021   CHOLHDL 4.9 (H) 05/04/2021       Latest Ref Rng & Units 04/19/2021   12:06 PM 01/18/2021    1:04 PM 11/16/2020   10:02 AM  Hepatic Function  Total Protein 6.0 - 8.3 g/dL 6.9   7.5   7.2    Albumin 3.5 - 5.2 g/dL 4.5   4.1   3.7    AST 0 - 37 U/L _0 ALT 0 - 35 U/L 25   28   38    Alk Phosphatase 39 - 117 U/L 58   67   78    Total Bilirubin 0.2 - 1.2 mg/dL 0.6   0.4   0.4      Lab Results  Component Value Date/Time   TSH 2.05 04/19/2021 12:06 PM   TSH 0.37 02/05/2021 01:26 PM       Latest Ref Rng & Units 04/30/2021    1:54 PM 04/19/2021   12:06 PM 01/18/2021    1:04 PM  CBC  WBC 4.0 - 10.5 K/uL 10.1   9.1   8.3    Hemoglobin 12.0 - 15.0 g/dL 14.4   14.4   14.1    Hematocrit 36.0 - 46.0 % 43.5   43.7   43.6    Platelets 150.0 - 400.0 K/uL 191.0   203.0   184      No results found for: VD25OH  Clinical ASCVD: No  The 10-year ASCVD risk score (Arnett DK, et al., 2019) is: 22.9%   Values used to calculate the score:     Age: 71 years     Sex: Female     Is Non-Hispanic African American: No     Diabetic: Yes     Tobacco smoker: No     Systolic Blood Pressure: 779 mmHg     Is BP treated: Yes     HDL Cholesterol: 49 mg/dL     Total Cholesterol: 242 mg/dL       04/06/2021    1:25 PM 01/18/2021    1:52 PM 09/08/2019   12:36 PM  Depression screen PHQ 2/9  Decreased Interest 0 0 0  Down, Depressed, Hopeless 1 1 0  PHQ - 2 Score 1 1 0      Social History   Tobacco Use  Smoking Status Never  Smokeless Tobacco Never   BP Readings from Last 3 Encounters:  05/31/21 117/71  05/04/21 136/74  04/19/21 119/79   Pulse Readings from Last 3 Encounters:  05/31/21 72  05/04/21 78  04/19/21 69   Wt Readings from Last 3 Encounters:  05/31/21 167 lb 3.2 oz (75.8 kg)  05/15/21 172 lb 13.5 oz (78.4 kg)  05/04/21 172 lb 12.8 oz (78.4 kg)   BMI Readings from Last 3 Encounters:  05/31/21 28.70 kg/m   05/15/21 29.67 kg/m  05/04/21 29.66 kg/m    Assessment/Interventions: Review of patient past medical history, allergies, medications, health status, including review of consultants reports, laboratory and other test data, was performed as part of  comprehensive evaluation and provision of chronic care management services.   SDOH:  (Social Determinants of Health) assessments and interventions performed: Yes  Financial Resource Strain: Medium Risk   Difficulty of Paying Living Expenses: Somewhat hard   Food Insecurity: Landscape architect Present   Worried About Charity fundraiser in the Last Year: Often true   Arboriculturist in the Last Year: Often true    SDOH Screenings   Alcohol Screen: Low Risk    Last Alcohol Screening Score (AUDIT): 0  Depression (PHQ2-9): Low Risk    PHQ-2 Score: 1  Financial Resource Strain: Medium Risk   Difficulty of Paying Living Expenses: Somewhat hard  Food Insecurity: Landscape architect Present   Worried About Charity fundraiser in the Last Year: Often true   Arboriculturist in the Last Year: Often true  Housing: Low Risk    Last Housing Risk Score: 0  Physical Activity: Inactive   Days of Exercise per Week: 0 days   Minutes of Exercise per Session: 0 min  Social Connections: Socially Isolated   Frequency of Communication with Friends and Family: Once a week   Frequency of Social Gatherings with Friends and Family: Once a week   Attends Religious Services: Never   Marine scientist or Organizations: No   Attends Archivist Meetings: Never   Marital Status: Divorced  Stress: Stress Concern Present   Feeling of Stress : To some extent  Tobacco Use: Low Risk    Smoking Tobacco Use: Never   Smokeless Tobacco Use: Never   Passive Exposure: Not on file  Transportation Needs: Unmet Transportation Needs   Lack of Transportation (Medical): No   Lack of Transportation (Non-Medical): Yes    CCM Care Plan  Allergies  Allergen  Reactions   Chloraprep One Step [Chlorhexidine Gluconate] Rash   Estrogens Other (See Comments)    PATIENT HAS A HISTORY OF CANCER AND HAS BEEN TOLD TO NEVER TAKE ANYTHING CONTAINING ESTROGEN, AS IT MIGHT CAUSE A RECURRENCE   Shrimp [Shellfish Allergy] Nausea And Vomiting    Medications Reviewed Today     Reviewed by Edythe Clarity, [facility] (Pharmacist) on 06/15/21 at 1006  Med List Status: <None>   Medication Order Taking? Sig Documenting Provider Last Dose Status Informant  ACCU-CHEK GUIDE test strip [id number] Yes 1 each by Other route as needed. [provider] Taking Active Self  albuterol (VENTOLIN HFA) 108 (90 Base) MCG/ACT inhaler [id number] Yes Inhale 2 puffs into the lungs every 6 (six) hours as needed for wheezing or shortness of breath. Margaretha Seeds, MD Taking Active   anastrozole (ARIMIDEX) 1 MG tablet [id number] Yes Take 1 tablet (1 mg total) by mouth daily. Benay Pike, MD Taking Active   Ascorbic Acid (VITAMIN C PO) [id number] Yes Take 1 tablet by mouth daily. [provider] Taking Active Self  aspirin EC 81 MG tablet [id number] Yes Take 81 mg by mouth daily. [provider] Taking Active Self  atorvastatin (LIPITOR) 40 MG tablet [id number] Yes Take 1 tablet (40 mg total) by mouth daily. Nahser, [name], MD Taking Active   bisoprolol (ZEBETA) 5 MG tablet [id number] Yes Take 1 tablet (5 mg total) by mouth daily. Nahser, [name], MD Taking Active   Calcium Citrate-Vitamin D (CALCIUM + D PO) [id number] Yes Take 1 tablet by mouth daily with supper. [provider] Taking Active Self  CINNAMON PO [id number] Yes Take 1 capsule by mouth daily with supper.  [provider] Taking Active Self  diclofenac sodium (VOLTAREN) 1 % GEL [id number] Yes Apply 2-4 g topically 4 (four) times daily as needed (as directed for pain). [provider] Taking Active Self  Dulaglutide (TRULICITY) 1.5 VQ/0.0QQ SOPN [id number] Yes Inject 1.5 mg into the  skin once a week. Allwardt, Randa Evens, PA-C Taking Active   escitalopram (LEXAPRO) 20 MG tablet [id number] Yes Take 1 tablet (20 mg total) by mouth daily. Allwardt, Randa Evens, PA-C Taking Active            Med Note Marcelline Deist, MELITTA D   Thu May 31, 2021 10:45 AM) Needs refill.  fenofibrate (TRICOR) 145 MG tablet [id number] Yes Take 1 tablet (145 mg total) by mouth daily. Nahser, [name], MD Taking Active   Ferrous Sulfate (IRON PO) [id number] Yes Take 1 tablet by mouth daily. alternates days:1 tablet one day and 2 tablets the next day [provider] Taking Active Self  fluticasone furoate-vilanterol (BREO ELLIPTA) 100-25 MCG/ACT AEPB [id number] Yes Inhale 1 puff into the lungs daily. Margaretha Seeds, MD Taking Active   gabapentin (NEURONTIN) 300 MG capsule [id number] Yes Take 1 capsule (300 mg total) by mouth 2 (two) times daily. Allwardt, Randa Evens, PA-C Taking Active   GLIPIZIDE XL 10 MG 24 hr tablet [id number] Yes Take 2 tablets (20 mg total) by mouth daily. Allwardt, Randa Evens, PA-C Taking Active   levothyroxine (SYNTHROID) 137 MCG tablet [id number] Yes 1 TABLET IN THE MORNING ON AN EMPTY STOMACH ORALLY ONCE A DAY 90 Allwardt, Alyssa M, PA-C Taking Active   loratadine (CLARITIN) 10 MG tablet [id number] Yes Take 1 tablet (10 mg total) by mouth daily as needed for allergies. Margaretha Seeds, MD Taking Active   metFORMIN (GLUCOPHAGE) 500 MG tablet [id number] Yes Take 2 tablets (1,000 mg total) by mouth 2 (two) times daily with a meal. Patient takes 1 tablet in AM and 2 tablets in PM Allwardt, Alyssa M, PA-C Taking Active   metroNIDAZOLE (METROGEL) 1 % gel [id number] Yes Apply 1 application topically as needed (as directed to affected area). Gardenia Phlegm, NP Taking Active Self  Multiple Vitamins-Calcium (ONE-A-DAY WOMENS PO) [id number] Yes Take 1 tablet by mouth daily. [provider] Taking Active Self  nitroGLYCERIN (NITROSTAT) 0.4 MG SL tablet [id number] Yes Place 1 tablet (0.4  mg total) under the tongue every 5 (five) minutes as needed for chest pain. Nahser, [name], MD Taking Active   omeprazole (PRILOSEC) 40 MG capsule [id number] Yes Take 1 capsule (40 mg total) by mouth daily with supper. Allwardt, Randa Evens, PA-C Taking Active   prochlorperazine (COMPAZINE) 10 MG tablet [id number] Yes TAKE 1 TABLET BY MOUTH BEFORE MEALS AND AT BEDTIME ON DAYS 2 AND 3 AFTER CHEMOTHERAPY (COUNTING CHEMOTHERAPY DAY AS DAY 1) AFTER THAT MAY TAKE AS NEEDED Alla Feeling, NP Taking Active   rOPINIRole (REQUIP) 0.5 MG tablet [id number] Yes TAKE 1 TABLET BY MOUTH EVERY DAY 1 TO 3 HOURS BEFORE BEDTIME Allwardt, Alyssa M, PA-C Taking Active   Tiotropium Bromide Monohydrate (SPIRIVA RESPIMAT) 2.5 MCG/ACT AERS [id number] Yes Inhale 2 puffs into the lungs daily. Margaretha Seeds, MD Taking Active   tiZANidine (ZANAFLEX) 4 MG tablet [id number] Yes Take 4 mg by mouth every 8 (eight) hours as needed for muscle spasms. As needed [provider] Taking Active Self  traMADol (ULTRAM) 50 MG tablet [id number] Yes Take 1 tablet (50 mg total) by mouth every 6 (six) hours as needed for moderate pain. Gery Pray, MD  Taking Active   TURMERIC PO [id number] Yes Take 1 capsule by mouth daily with supper. [provider] Taking Active Self            Patient Active Problem List   Diagnosis Date Noted   Frequent headaches 05/31/2021   Hoarse 01/25/2021   Hyperglycemia due to type 2 diabetes mellitus ([place]) 01/18/2021   Mixed simple and mucopurulent chronic bronchitis ([place]) 12/27/2020   Centrilobular emphysema ([place]) 07/31/2020   Shortness of breath 07/31/2020   Pressure injury of skin 04/30/2020   Hypothyroid    Anxiety    Anxiety and depression    Port-A-Cath in place 11/24/2019   Hepatic steatosis 09/28/2019   Malignant neoplasm of upper-outer quadrant of left breast in female, estrogen receptor positive ([place]) 09/02/2019   Trigger index finger of right hand 05/05/2019   Primary  osteoarthritis of first carpometacarpal joint of right hand 05/05/2019   Endometrial cancer ([place]) 10/23/2018   Cirrhosis of liver without ascites ([place]) 10/23/2018   Diarrhea 10/23/2018   Chest pain 02/15/2018   Coronary artery disease involving native coronary artery of native heart without angina pectoris 11/18/2017   Hyperlipidemia LDL goal <70 11/18/2017   Type 2 diabetes mellitus without complication, without long-term current use of insulin ([place]) 11/18/2017   Dizziness 11/18/2017    Immunization History  Administered Date(s) Administered   Influenza, High Dose Seasonal PF 12/31/2016, 10/24/2017   Pneumococcal Conjugate-13 10/24/2017   Td 07/12/2005, 02/08/2019    Conditions to be addressed/monitored:  CAD, Emphysema, Type II DM, Hypothyroidism, HLD, Depression  Care Plan : General Pharmacy (Adult)  Updates made by Edythe Clarity, RPH since 06/15/2021 12:00 AM     Problem: CAD, Emphysema, Type II DM, Hypothyroidism, HLD, Depression   Priority: High  Onset Date: 05/18/2021     Long-Range Goal: Patient-Specific Goal   Start Date: 05/18/2021  Expected End Date: 11/18/2021  Recent Progress: On track  Priority: High  Note:   Current Barriers:  Unable to achieve control of glucose, lipids   Pharmacist Clinical Goal(s):  Patient will achieve improvement in A1c, LDL, TG as evidenced by labs through collaboration with PharmD and provider.   Interventions: 1:1 collaboration with Allwardt, Randa Evens, PA-C regarding development and update of comprehensive plan of care as evidenced by provider attestation and co-signature Inter-disciplinary care team collaboration (see longitudinal plan of care) Comprehensive medication review performed; medication list updated in electronic medical record  Hyperlipidemia: (LDL goal < 100) -Uncontrolled -Current treatment: Atorvastatin 56m daily Appropriate, Query effective, ,  Fenofibrate 145 mg Appropriate, Query effective, ,  -Medications  previously tried: none noted  -Current dietary patterns: see DM -Current exercise habits: minimal -Educated on Cholesterol goals;  Benefits of statin for ASCVD risk reduction; Importance of limiting foods high in cholesterol; Importance of adherence with meds -Recommended to continue current medication She has FU with Nahser for lipid panel in a few months.  If current medication is not working to bring down her liipds, TG's would recommend addition of something like Repatha.  She would also be willing to speak with nutrition for help with this as well as her DM.   Diabetes (A1c goal <8%) -Controlled -Current medications: Trulicity 14.1DEonce weekly Appropriate, Query effective, ,  Metformin 5029m1 tablet AM and 2 tablets PM Appropriate, Query effective, ,  Glipizide XL 107mppropriate, Query effective, ,  Jardiance 19m39mpropriate, Query effective, ,  -Medications previously tried: none noted  -Current home glucose readings fasting glucose: not checking post prandial  glucose: not checking -Denies hypoglycemic/hyperglycemic symptoms -Current meal patterns:  breakfast: hot tea, toast with smart balance butter, pancakes  lunch: roast beef sub (big one for lunch and supper)  dinner: hamburger, mashed potatoes, stir fry veggies snacks: chocolate, loves sweets drinks: diet coke/pepsi, sugar free tea, water -Current exercise: minimal -Educated on A1c and blood sugar goals; Complications of diabetes including kidney damage, retinal damage, and cardiovascular disease; Prevention and management of hypoglycemic episodes; Benefits of routine self-monitoring of blood sugar; Carbohydrate counting and/or plate method -Counseled to check feet daily and get yearly eye exams -Recommended to continue current medication -She mentions she would be open to talking to nutrition.  Most of her nutrition is carbohydrate heavy, also loves sweets.  Social work could get her in contact with meals on  wheels or similar service to help provide healthier meals at a convenience.  She is also now without a car as it just got re-possessed. She needs updated A1c as she has not been checking glucose at home.  Unfortunately the next step in her care would be insulin.  Update 06/15/21 Yesterday diet recall Breakfast - toast with margarine Lunch - Steak sandwich Dinner - frozen meal, salisbury steak w/ mac and cheese Snacks - ritz cheese and crackers 2 throughout the day, late night snacks cucumber Drinks - diet soda, iced tea, cup of tea with coconut sugar [place] - none  Glucose - she is not checking at home.  We discussed current carbohydrate content of dietary choices.  She is going to work on replacing some of her snacks with veggies and her breads with lower carb wrap options.  Encouraged her to start some for of exercise even if it is just walking outside for a few minutes. She is also going to start checking her fasting blood sugars in the morning and record. Will have CMA FU on these readings in a week or two. Want to avoid insulin if possible but my next plan would be to d/c glipizide and add some long acting insulin in the morning to cover her throughout the day. No changes at this time, need to recheck A1c in 3 months.   Depression/Anxiety (Goal: Reduce symptoms) -Not ideally controlled -Current treatment: Escitalopram 18m Appropriate, Query effective, , -Medications previously tried/failed: Citalopram -PHQ9:     04/06/2021    1:25 PM 01/18/2021    1:52 PM 09/08/2019   12:36 PM  PHQ9 SCORE ONLY  PHQ-9 Total Score 1 1 0   -GAD7:      View : No data to display.        -Educated on Benefits of medication for symptom control Benefits of speaking with family regularly. She states increased of Lexapro is helping.  Encouraged her to reach out for help to providers if suicidal ideation arises or symptoms worsen.  I think having someone talk to her regularly and encourage her to  make small changes would be very beneficial for health and mental state. -Recommended to continue current medication Needs FU with PCP for evaluation.  Update 06/15/21 Doing much better with her mental health.  She is seeing a counselor recommended to her by sEducation officer, museum  She is now making friends with a neighbor and going to church with her.  She feels/sounds much better than my previous visits with her.  Encouraged her to continue these positive changes!! No changes to medications at this time.  Hypothyroidism (Goal: Maintain TSH) -Controlled -Current treatment  Levothyroxine 1375m Appropriate, Effective, Safe, Accessible -Medications previously tried:  none noted  -Recommended to continue current medication Takes appropriately and TSH is normal  Patient Goals/Self-Care Activities Patient will:  - focus on medication adherence by pill packs check glucose daily, document, and provide at future appointments engage in dietary modifications by cutting back on carbs (bread(    Follow Up Plan: The care management team will reach out to the patient again over the next 180 days.           Medication Assistance: None required.  Patient affirms current coverage meets needs.  Compliance/Adherence/Medication fill history: Care Gaps: Foot Exam, urine microalbumin  Star-Rating Drugs: Bisoprolol 5 mg last filled 04/27/2021 90 DS Metformin 500 mg last filled 03/12/2021 90 DS Atorvastatin 40 mg last filled 04/03/2021 90 DS  Patient's preferred pharmacy is:  [facility] - [place], [place] - [address] [place] [address] Phone: [phone] Fax: [phone]  [id number] [place] [id number]: [phone]: [phone] Uses pill box? Yes Pt endorses 100% compliance  We discussed: Benefits of medication synchronization, packaging and delivery as  well as enhanced pharmacist oversight with [facility]. Patient decided to: Utilize [facility] pharmacy for medication synchronization, packaging and delivery  Care Plan and Follow Up Patient Decision:  Patient agrees to Care Plan and Follow-up.  Plan: The care management team will reach out to the patient again over the next 180 days.  CBeverly Milch PharmD Clinical Pharmacist  [facility]([phone]

## 2021-06-14 NOTE — Chronic Care Management (AMB) (Signed)
Chronic Care Management   Clinical Social Work Note  06/14/2021 Name: Felicia [name] MRN: [id number] DOB: 04/25/1950  Felicia [name] is a 71 y.o. year old female who is a primary care patient of Allwardt, Randa Evens, PA-C. The CCM team was consulted to assist the patient with chronic disease management and/or care coordination needs related to: Transportation Needs , Mental Health Counseling and Resources, and stress .   Engaged with patient by telephone for follow up visit in response to provider referral for social work chronic care management and care coordination services.   Consent to Services:  The patient was given information about Chronic Care Management services, agreed to services, and gave verbal consent prior to initiation of services.  Please see initial visit note for detailed documentation.   Patient agreed to services and consent obtained.   Summary:  Patient is making progress with reducing stress and symptoms of anxiety She has her first therapy session with [facility]..is hesitate about going back due to having limited rides with her insurance and co-pays.  Based on other medical appointments she has to choose which one is most important for her right now. She will call [facility] when ready to schedule next appointment. Patient is also getting out of the house, engaging with her neighbor and connecting to faith support.   See Care Plan below for interventions and patient self-care actives.  Recommendation: Patient may benefit from, and is in agreement to complete the SCAT application for additional transportation options to medical and non-medical appointments ( application mailed and e-mail).   Follow up Plan: Patient would like continued follow-up from CCM LCSW.  per patient's request will follow up in 3 weeks.  Will call office if needed prior to next encounter.    Assessment: Review of patient past medical history, allergies, medications, and health status,  including review of relevant consultants reports was performed today as part of a comprehensive evaluation and provision of chronic care management and care coordination services.     SDOH (Social Determinants of Health) assessments and interventions performed:  SDOH Interventions    Flowsheet Row Most Recent Value  SDOH Interventions   Stress Interventions Provide Counseling  Transportation Interventions SCAT (Specialized Community Area Transporation)        Advanced Directives Status: Not addressed in this encounter.  CCM Care Plan Conditions to be addressed/monitored: Anxiety and stress ; Transportation  Care Plan : LCSW Plan of Care  Updates made by Maurine Cane, LCSW since 06/14/2021 12:00 AM     Problem: Coping Skills      Goal: Coping Skills Enhanced   Start Date: 05/31/2021  This Visit's Progress: On track  Recent Progress: On track  Priority: High  Note:   Current Barriers:  Disease Management support and education needs related to Stress  Transportation and Stress Lacks knowledge of how to connect   [place]):  Patient  will explore community resource options for unmet needs related to:  Depression   through collaboration with [place] representative, provider, and care team.   Interventions: 1:1 collaboration with primary care provider regarding development and update of comprehensive plan of care as evidenced by provider attestation and co-signature Inter-disciplinary care team collaboration (see longitudinal plan of care) Evaluation of current treatment plan related to  self management and patient's adherence to plan as established by provider Review resources, discussed options and provided patient information about  Department of Social Services ( has food stamps ) Transportation provided  by insurance provider (uses for medical appointments provided SCAT application) Referral to care guide (for resources)   Mental Health:  (Status: Goal on  Track (progressing): YES.) had first appointment with [facility] Evaluation of current treatment plan related to Depression: anxiety Motivational Interviewing employed Solution-Focused Strategies employed:  Active listening / Reflection utilized  Emotional Support Provided Problem [place] strategies reviewed Participation in counseling encouraged  Discussed barriers to therapy ( transportation)  Task & activities to accomplish goals: Complete SCAT application has been mailed Continue with compliance of taking medication prescribed by Doctor Call to make next appointment with [facility];  [address] [place], [place] [address]    Stokes, [place] Licensed Clinical Social Worker Dossie Arbour Management  [facility] [place] [phone]

## 2021-06-14 NOTE — Patient Instructions (Signed)
Visit Information  Thank you for taking time to visit with me today. Please don't hesitate to contact me if I can be of assistance to you before our next scheduled telephone appointment.  Following are the goals we discussed today:  Task & activities to accomplish goals: Complete SCAT application has been mailed Continue with compliance of taking medication prescribed by Doctor Call to make next appointment with [facility];  [address] [place],  [address]    [place], LCSW Licensed Clinical Social Worker Dossie Arbour Management  [facility] [place] [phone]   Our next appointment is by telephone on June 22nd at 1:30  Please call the care guide team at [phone] if you need to cancel or reschedule your appointment.   If you are experiencing a Mental Health or [place] or need someone to talk to, please call the Suicide and Crisis Lifeline: 988 call 1-800-273-TALK (toll free, 24 hour hotline)   The patient verbalized understanding of instructions, educational materials, and care plan provided today and agreed to receive a mailed copy of patient instructions, educational materials, and care plan.

## 2021-06-15 ENCOUNTER — Ambulatory Visit: Payer: Medicare Other | Admitting: Pharmacist

## 2021-06-15 DIAGNOSIS — E1165 Type 2 diabetes mellitus with hyperglycemia: Secondary | ICD-10-CM

## 2021-06-15 DIAGNOSIS — F419 Anxiety disorder, unspecified: Secondary | ICD-10-CM

## 2021-06-15 NOTE — Patient Instructions (Addendum)
Visit Information   Goals Addressed             This Visit's Progress    Track and Manage My Symptoms-Depression   On track    Timeframe:  Long-Range Goal Priority:  High Start Date:   05/18/21                          Expected End Date:  11/18/21                     Follow Up Date 08/18/21    - develop a personal safety plan - exercise at least 2 to 3 times per week - have a plan for how to handle bad days - spend time or talk with others at least 2 to 3 times per week    Why is this important?   Keeping track of your progress will help your treatment team find the right mix of medicine and therapy for you.  Write in your journal every day.  Day-to-day changes in depression symptoms are normal. It may be more helpful to check your progress at the end of each week instead of every day.     Notes:        Patient Care Plan: General Pharmacy (Adult)     Problem Identified: CAD, Emphysema, Type II DM, Hypothyroidism, HLD, Depression   Priority: High  Onset Date: 05/18/2021     Long-Range Goal: Patient-Specific Goal   Start Date: 05/18/2021  Expected End Date: 11/18/2021  Recent Progress: On track  Priority: High  Note:   Current Barriers:  Unable to achieve control of glucose, lipids   Pharmacist Clinical Goal(s):  Patient will achieve improvement in A1c, LDL, TG as evidenced by labs through collaboration with PharmD and provider.   Interventions: 1:1 collaboration with Allwardt, Randa Evens, PA-C regarding development and update of comprehensive plan of care as evidenced by provider attestation and co-signature Inter-disciplinary care team collaboration (see longitudinal plan of care) Comprehensive medication review performed; medication list updated in electronic medical record  Hyperlipidemia: (LDL goal < 100) -Uncontrolled -Current treatment: Atorvastatin 71m daily Appropriate, Query effective, ,  Fenofibrate 145 mg Appropriate, Query effective, ,  -Medications  previously tried: none noted  -Current dietary patterns: see DM -Current exercise habits: minimal -Educated on Cholesterol goals;  Benefits of statin for ASCVD risk reduction; Importance of limiting foods high in cholesterol; Importance of adherence with meds -Recommended to continue current medication She has FU with [facility] for lipid panel in a few months.  If current medication is not working to bring down her liipds, TG's would recommend addition of something like Repatha.  She would also be willing to speak with nutrition for help with this as well as her DM.   Diabetes (A1c goal <8%) -Controlled -Current medications: Trulicity 12.3FTonce weekly Appropriate, Query effective, ,  Metformin 5063m1 tablet AM and 2 tablets PM Appropriate, Query effective, ,  Glipizide XL 104mppropriate, Query effective, ,  Jardiance 64m79mpropriate, Query effective, ,  -Medications previously tried: none noted  -Current home glucose readings fasting glucose: not checking post prandial glucose: not checking -Denies hypoglycemic/hyperglycemic symptoms -Current meal patterns:  breakfast: hot tea, toast with smart balance butter, pancakes  lunch: roast beef sub (big one for lunch and supper)  dinner: hamburger, mashed potatoes, stir fry veggies snacks: chocolate, loves sweets drinks: diet coke/pepsi, sugar free tea, water -Current exercise: minimal -Educated on A1c and blood  sugar goals; Complications of diabetes including kidney damage, retinal damage, and cardiovascular disease; Prevention and management of hypoglycemic episodes; Benefits of routine self-monitoring of blood sugar; Carbohydrate counting and/or plate method -Counseled to check feet daily and get yearly eye exams -Recommended to continue current medication -She mentions she would be open to talking to nutrition.  Most of her nutrition is carbohydrate heavy, also loves sweets.  Social work could get her in contact with meals on  wheels or similar service to help provide healthier meals at a convenience.  She is also now without a car as it just got re-possessed. She needs updated A1c as she has not been checking glucose at home.  Unfortunately the next step in her care would be insulin.  Update 06/15/21 Yesterday diet recall Breakfast - toast with margarine Lunch - Steak sandwich Dinner - frozen meal, [place] steak w/ mac and cheese Snacks - ritz cheese and crackers 2 throughout the day, late night snacks cucumber Drinks - diet soda, iced tea, cup of tea with coconut sugar [place] - none  Glucose - she is not checking at home.  We discussed current carbohydrate content of dietary choices.  She is going to work on replacing some of her snacks with veggies and her breads with lower carb wrap options.  Encouraged her to start some for of exercise even if it is just walking outside for a few minutes. She is also going to start checking her fasting blood sugars in the morning and record. Will have CMA FU on these readings in a week or two. Want to avoid insulin if possible but my next plan would be to d/c glipizide and add some long acting insulin in the morning to cover her throughout the day. No changes at this time, need to recheck A1c in 3 months.   Depression/Anxiety (Goal: Reduce symptoms) -Not ideally controlled -Current treatment: Escitalopram 23m Appropriate, Query effective, , -Medications previously tried/failed: Citalopram -PHQ9:     04/06/2021    1:25 PM 01/18/2021    1:52 PM 09/08/2019   12:36 PM  PHQ9 SCORE ONLY  PHQ-9 Total Score 1 1 0   -GAD7:      View : No data to display.        -Educated on Benefits of medication for symptom control Benefits of speaking with family regularly. She states increased of Lexapro is helping.  Encouraged her to reach out for help to providers if suicidal ideation arises or symptoms worsen.  I think having someone talk to her regularly and encourage her to  make small changes would be very beneficial for health and mental state. -Recommended to continue current medication Needs FU with PCP for evaluation.  Update 06/15/21 Doing much better with her mental health.  She is seeing a counselor recommended to her by sEducation officer, museum  She is now making friends with a neighbor and going to church with her.  She feels/sounds much better than my previous visits with her.  Encouraged her to continue these positive changes!! No changes to medications at this time.  Hypothyroidism (Goal: Maintain TSH) -Controlled -Current treatment  Levothyroxine 1365m Appropriate, Effective, Safe, Accessible -Medications previously tried: none noted  -Recommended to continue current medication Takes appropriately and TSH is normal  Patient Goals/Self-Care Activities Patient will:  - focus on medication adherence by pill packs check glucose daily, document, and provide at future appointments engage in dietary modifications by cutting back on carbs (bread(    Follow Up Plan: The care management  team will reach out to the patient again over the next 180 days.        Patient Care Plan: LCSW Plan of Care     Problem Identified: Coping Skills      Goal: Coping Skills Enhanced   Start Date: 05/31/2021  This Visit's Progress: On track  Recent Progress: On track  Priority: High  Note:   Current Barriers:  Disease Management support and education needs related to Stress  Transportation and Stress Lacks knowledge of how to connect   [place]):  Patient  will explore community resource options for unmet needs related to:  Depression   through collaboration with [place] representative, provider, and care team.   Interventions: 1:1 collaboration with primary care provider regarding development and update of comprehensive plan of care as evidenced by provider attestation and co-signature Inter-disciplinary care team collaboration (see longitudinal plan  of care) Evaluation of current treatment plan related to  self management and patient's adherence to plan as established by provider Review resources, discussed options and provided patient information about  Department of Social Services ( has food stamps ) Transportation provided by insurance provider (uses for medical appointments provided SCAT application) Referral to care guide (for resources)   Mental Health:  (Status: Goal on Track (progressing): YES.) had first appointment with [facility] Evaluation of current treatment plan related to Depression: anxiety Motivational Interviewing employed Solution-Focused Strategies employed:  Active listening / Reflection utilized  Emotional Support Provided Problem [place] strategies reviewed Participation in counseling encouraged  Discussed barriers to therapy ( transportation)  Task & activities to accomplish goals: Complete SCAT application has been mailed Continue with compliance of taking medication prescribed by Doctor Call to make next appointment with [facility];  [address] [place], [place] [address]    [phone]      The patient verbalized understanding of instructions, educational materials, and care plan provided today and DECLINED offer to receive copy of patient instructions, educational materials, and care plan.  Telephone follow up appointment with pharmacy team member scheduled for: 2 months  Edythe Clarity, [place], PharmD Clinical Pharmacist  [facility] [phone]

## 2021-06-18 ENCOUNTER — Other Ambulatory Visit: Payer: Self-pay

## 2021-06-18 MED ORDER — BLOOD GLUCOSE METER KIT: PACK | 0 refills | Status: AC

## 2021-06-19 ENCOUNTER — Ambulatory Visit: Payer: Medicare Other | Admitting: *Deleted

## 2021-06-19 DIAGNOSIS — I10 Essential (primary) hypertension: Secondary | ICD-10-CM

## 2021-06-19 DIAGNOSIS — E1165 Type 2 diabetes mellitus with hyperglycemia: Secondary | ICD-10-CM

## 2021-06-19 NOTE — Patient Instructions (Signed)
Visit Information  Thank you for allowing me to share the care management and care coordination services that are available to you as part of your health plan and services through your primary care provider and medical home. Please reach out to me at [phone] if the care management/care coordination team may be of assistance to you in the future.   Puja Caffey RN, MSN RN Care Management Coordinator  [facility] [place] [phone] [email or web address]  

## 2021-06-19 NOTE — Chronic Care Management (AMB) (Signed)
  Care Management   Follow Up Note   06/19/2021 Name: Felicia [name] MRN: [id number] DOB: 02-Jun-1950   Referred by: Allwardt, Randa Evens, PA-C Reason for referral : Chronic Care Management (INITIAL)   Successful contact was made with the patient to discuss care management and care coordination services. Patient declines engagement at this time.   RNCM introduced self and role.  Declines RNCM out reaches but wants to continue with CCM of Pharmacy and Social Work.  Stating she is speaking with lots of providers and others, adding one more person would be overwhelming at this time.  Provided patient with my contact information and encouraged to call in future if needs arise.  Follow Up Plan: No further follow up required: as patient declines  RNCM outreaches at this time.  Hubert Azure RN, MSN RN Care Management Coordinator  [facility] [phone] [email or web address]

## 2021-06-25 ENCOUNTER — Other Ambulatory Visit: Payer: Self-pay | Admitting: Physician Assistant

## 2021-06-25 MED ORDER — EMPAGLIFLOZIN 25 MG PO TABS: 25.0000 mg | ORAL_TABLET | Freq: Every day | ORAL | 1 refills | Status: AC

## 2021-06-26 ENCOUNTER — Telehealth: Payer: Self-pay | Admitting: Physician Assistant

## 2021-06-26 NOTE — Telephone Encounter (Signed)
Pt states she wants PCP to know that her pain level in her lungs is "at a 10" so she has started to take the pain medications.   Pt states she is dealing well with pain management, but would like follow up about what to do next about finding the source of the lung pain.  Pt declined setting up an appointment, virtual included, until PCP determines it is needed. Lack of transportation may be a factor.

## 2021-06-26 NOTE — Telephone Encounter (Signed)
Spoke with patient and she advised it is also hurting in her back, advised per Alyssa recommendations of pain is at a 10 and where pain is being explained she needs to be seen by ED to examine. Advised pt if transportation is an issue please call 911 to take her to ED. Patient verbalized understanding and expressed she was going to go to ED

## 2021-06-27 ENCOUNTER — Encounter (HOSPITAL_COMMUNITY): Payer: Self-pay

## 2021-06-27 ENCOUNTER — Emergency Department (HOSPITAL_COMMUNITY): Payer: Medicare Other

## 2021-06-27 ENCOUNTER — Emergency Department (HOSPITAL_COMMUNITY)
Admission: EM | Admit: 2021-06-27 | Discharge: 2021-06-28 | Disposition: A | Discharge status: Home / Self Care | Payer: Medicare Other | Attending: Emergency Medicine | Admitting: Emergency Medicine

## 2021-06-27 ENCOUNTER — Other Ambulatory Visit: Payer: Self-pay

## 2021-06-27 DIAGNOSIS — Z8541 Personal history of malignant neoplasm of cervix uteri: Secondary | ICD-10-CM | POA: Insufficient documentation

## 2021-06-27 DIAGNOSIS — I2510 Atherosclerotic heart disease of native coronary artery without angina pectoris: Secondary | ICD-10-CM | POA: Diagnosis not present

## 2021-06-27 DIAGNOSIS — E039 Hypothyroidism, unspecified: Secondary | ICD-10-CM | POA: Diagnosis not present

## 2021-06-27 DIAGNOSIS — R072 Precordial pain: Secondary | ICD-10-CM

## 2021-06-27 DIAGNOSIS — Z7984 Long term (current) use of oral hypoglycemic drugs: Secondary | ICD-10-CM | POA: Diagnosis not present

## 2021-06-27 DIAGNOSIS — I10 Essential (primary) hypertension: Secondary | ICD-10-CM | POA: Diagnosis not present

## 2021-06-27 DIAGNOSIS — Z79899 Other long term (current) drug therapy: Secondary | ICD-10-CM | POA: Insufficient documentation

## 2021-06-27 DIAGNOSIS — E1165 Type 2 diabetes mellitus with hyperglycemia: Secondary | ICD-10-CM | POA: Insufficient documentation

## 2021-06-27 DIAGNOSIS — Z794 Long term (current) use of insulin: Secondary | ICD-10-CM | POA: Diagnosis not present

## 2021-06-27 DIAGNOSIS — Z7982 Long term (current) use of aspirin: Secondary | ICD-10-CM | POA: Diagnosis not present

## 2021-06-27 DIAGNOSIS — D72829 Elevated white blood cell count, unspecified: Secondary | ICD-10-CM | POA: Insufficient documentation

## 2021-06-27 DIAGNOSIS — R079 Chest pain, unspecified: Secondary | ICD-10-CM | POA: Diagnosis present

## 2021-06-27 DIAGNOSIS — R0602 Shortness of breath: Secondary | ICD-10-CM | POA: Insufficient documentation

## 2021-06-27 DIAGNOSIS — Z853 Personal history of malignant neoplasm of breast: Secondary | ICD-10-CM | POA: Diagnosis not present

## 2021-06-27 LAB — BASIC METABOLIC PANEL
Anion gap: 14 (ref 5–15)
BUN: 19 mg/dL (ref 8–23)
CO2: 20 mmol/L — ABNORMAL LOW (ref 22–32)
Calcium: 11.1 mg/dL — ABNORMAL HIGH (ref 8.9–10.3)
Chloride: 105 mmol/L (ref 98–111)
Creatinine, Ser: 0.9 mg/dL (ref 0.44–1.00)
GFR, Estimated: >60 mL/min (ref >60)
Glucose, Bld: 136 mg/dL — ABNORMAL HIGH (ref 70–99)
Potassium: 4.7 mmol/L (ref 3.5–5.1)
Sodium: 139 mmol/L (ref 135–145)

## 2021-06-27 LAB — CBC
HCT: 48.1 % — ABNORMAL HIGH (ref 36.0–46.0)
Hemoglobin: 15.3 g/dL — ABNORMAL HIGH (ref 12.0–15.0)
MCH: 31.9 pg (ref 26.0–34.0)
MCHC: 31.8 g/dL (ref 30.0–36.0)
MCV: 100.2 fL — ABNORMAL HIGH (ref 80.0–100.0)
Platelets: 242 10*3/uL (ref 150–400)
RBC: 4.8 MIL/uL (ref 3.87–5.11)
RDW: 14 % (ref 11.5–15.5)
WBC: 11 10*3/uL — ABNORMAL HIGH (ref 4.0–10.5)
nRBC: 0 % (ref 0.0–0.2)

## 2021-06-27 LAB — TROPONIN I (HIGH SENSITIVITY)
Troponin I (High Sensitivity): 3 ng/L (ref <18)
Troponin I (High Sensitivity): 3 ng/L (ref <18)

## 2021-06-27 NOTE — ED Triage Notes (Signed)
PER EMS: pt from home with c/o central CP and SOB, pain worse with inspiration. She used an inhaler at home with no relief. Lung sounds clear. Cardiac hx. 324 aspirin taken at home 20g R hand  BP- 152/80, HR-75, 96% RA, RR-16

## 2021-06-27 NOTE — ED Provider Triage Note (Signed)
Emergency Medicine Provider Triage Evaluation Note  Hira Trent , a 71 y.o. female  was evaluated in triage.  Pt complains of chest pain and shortness of breath.  Ongoing for several days.  Does have history of CAD and is status post stents.  Most recently in 2017 per patient.  States this does not feel like her previous MI.  She is concerned that something is going on with her lungs.  Has received 324 aspirin with EMS.  Review of Systems  Positive: As above Negative: As above  Physical Exam  BP 125/67 (BP Location: Right Arm)   Pulse 79   Temp 98.3 F (36.8 C) (Oral)   Resp 18   Ht '5\' 4"'$  (1.626 m)   Wt 77.1 kg   SpO2 98%   BMI 29.18 kg/m  Gen:   Awake, no distress   Resp:  Normal effort  MSK:   Moves extremities without difficulty  Other:   Medical Decision Making  Medically screening exam initiated at 8:11 PM.  Appropriate orders placed.  Belanna Manring was informed that the remainder of the evaluation will be completed by another provider, this initial triage assessment does not replace that evaluation, and the importance of remaining in the ED until their evaluation is complete.     Evlyn Courier, PA-C 06/27/21 2012

## 2021-06-28 NOTE — Discharge Instructions (Signed)

## 2021-06-28 NOTE — ED Provider Notes (Signed)
[facility] EMERGENCY DEPARTMENT Provider Note   CSN: [id number] Arrival date & time: 06/27/21  1930     History  Chief Complaint  Patient presents with   Chest Pain   Shortness of Breath    Felicia [name] is a 71 y.o. female.  The history is provided by the patient.  Chest Pain Duration:  12 months Relieved by:  Nothing Worsened by:  Nothing Associated symptoms: shortness of breath   Associated symptoms: no cough, no fever and no vomiting   Shortness of Breath Associated symptoms: chest pain   Associated symptoms: no cough, no fever and no vomiting   Patient with history of CAD, diabetes, breast cancer presents with chest pain.  Patient reports she has had diffuse chest pain for up to a year.  She reports its and both sides of her "lungs" She reports it felt somewhat worse yesterday.  No fevers or vomiting.  No cough.  Patient is concerned that it is her lungs but no etiology has been found    Past Medical History:  Diagnosis Date   Anemia    Anxiety    Breast cancer ([place]) 09/2019   left breast IMC   CAD (coronary artery disease)    a. 3/2007s/p DES to the LAD [facility]); b. 01/2017 MV: EF 80%, small, mild apical ant defect w/o ischemia (felt to be breast atten). Low risk.   Depression    DM (diabetes mellitus) ([place])    Essential hypertension    GERD (gastroesophageal reflux disease)    Headache    secondary to neck surgery per patient   High triglycerides    Hyperlipidemia    Hypothyroid    OA (osteoarthritis)    right knee,hands   Overflow incontinence    PONV (postoperative nausea and vomiting)    s/p gallbladder    RLS (restless legs syndrome)    Urinary urgency    Uterine cancer ([place]) dx'd 2014    Home Medications Prior to Admission medications   Medication Sig Start Date End Date Taking? Authorizing Provider  ACCU-CHEK GUIDE test strip 1 each by Other route as needed. 04/19/19   [provider]  albuterol (VENTOLIN HFA)  108 (90 Base) MCG/ACT inhaler Inhale 2 puffs into the lungs every 6 (six) hours as needed for wheezing or shortness of breath. 05/23/21   Margaretha Seeds, MD  anastrozole (ARIMIDEX) 1 MG tablet Take 1 tablet (1 mg total) by mouth daily. 05/23/21   Benay Pike, MD  Ascorbic Acid (VITAMIN C PO) Take 1 tablet by mouth daily.    [provider]  aspirin EC 81 MG tablet Take 81 mg by mouth daily.    [provider]  atorvastatin (LIPITOR) 40 MG tablet Take 1 tablet (40 mg total) by mouth daily. 05/22/21   Nahser, [name], MD  bisoprolol (ZEBETA) 5 MG tablet Take 1 tablet (5 mg total) by mouth daily. 05/22/21   Nahser, [name], MD  blood glucose meter kit and supplies Dispense based on patient and insurance preference. Use up to four times daily as directed. (FOR ICD-10 E10.9, E11.9). 06/18/21   Allwardt, Alyssa M, PA-C  Calcium Citrate-Vitamin D (CALCIUM + D PO) Take 1 tablet by mouth daily with supper.    [provider]  CINNAMON PO Take 1 capsule by mouth daily with supper.    [provider]  diclofenac sodium (VOLTAREN) 1 % GEL Apply 2-4 g topically 4 (four) times daily as needed (as directed  for pain).    [provider]  Dulaglutide (TRULICITY) 1.5 YB/0.1BP SOPN Inject 1.5 mg into the skin once a week. 05/29/21 08/21/21  Allwardt, Randa Evens, PA-C  empagliflozin (JARDIANCE) 25 MG TABS tablet Take 1 tablet (25 mg total) by mouth daily before breakfast. 06/25/21 09/23/21  Allwardt, Alyssa M, PA-C  escitalopram (LEXAPRO) 20 MG tablet Take 1 tablet (20 mg total) by mouth daily. 05/30/21 11/26/21  Allwardt, Randa Evens, PA-C  fenofibrate (TRICOR) 145 MG tablet Take 1 tablet (145 mg total) by mouth daily. 05/22/21   Nahser, [name], MD  Ferrous Sulfate (IRON PO) Take 1 tablet by mouth daily. alternates days:1 tablet one day and 2 tablets the next day    [provider]  fluticasone furoate-vilanterol (BREO ELLIPTA) 100-25 MCG/ACT AEPB Inhale 1 puff into the lungs  daily. 05/23/21   Margaretha Seeds, MD  gabapentin (NEURONTIN) 300 MG capsule Take 1 capsule (300 mg total) by mouth 2 (two) times daily. 06/05/21   Allwardt, Alyssa M, PA-C  GLIPIZIDE XL 10 MG 24 hr tablet Take 2 tablets (20 mg total) by mouth daily. 05/29/21   Allwardt, Randa Evens, PA-C  levothyroxine (SYNTHROID) 137 MCG tablet 1 TABLET IN THE MORNING ON AN EMPTY STOMACH ORALLY ONCE A DAY 90 05/29/21   Allwardt, Alyssa M, PA-C  loratadine (CLARITIN) 10 MG tablet Take 1 tablet (10 mg total) by mouth daily as needed for allergies. 05/23/21   Margaretha Seeds, MD  metFORMIN (GLUCOPHAGE) 500 MG tablet Take 2 tablets (1,000 mg total) by mouth 2 (two) times daily with a meal. Patient takes 1 tablet in AM and 2 tablets in PM 06/05/21   Allwardt, Alyssa M, PA-C  metroNIDAZOLE (METROGEL) 1 % gel Apply 1 application topically as needed (as directed to affected area). 04/20/20   Gardenia Phlegm, NP  Multiple Vitamins-Calcium (ONE-A-DAY WOMENS PO) Take 1 tablet by mouth daily.    [provider]  nitroGLYCERIN (NITROSTAT) 0.4 MG SL tablet Place 1 tablet (0.4 mg total) under the tongue every 5 (five) minutes as needed for chest pain. 05/04/21   Nahser, [name], MD  omeprazole (PRILOSEC) 40 MG capsule Take 1 capsule (40 mg total) by mouth daily with supper. 05/29/21   Allwardt, Randa Evens, PA-C  prochlorperazine (COMPAZINE) 10 MG tablet TAKE 1 TABLET BY MOUTH BEFORE MEALS AND AT BEDTIME ON DAYS 2 AND 3 AFTER CHEMOTHERAPY (COUNTING CHEMOTHERAPY DAY AS DAY 1) AFTER THAT MAY TAKE AS NEEDED 05/04/20   Alla Feeling, NP  rOPINIRole (REQUIP) 0.5 MG tablet TAKE 1 TABLET BY MOUTH EVERY DAY 1 TO 3 HOURS BEFORE BEDTIME 05/29/21   Allwardt, Alyssa M, PA-C  Tiotropium Bromide Monohydrate (SPIRIVA RESPIMAT) 2.5 MCG/ACT AERS Inhale 2 puffs into the lungs daily. 05/23/21   Margaretha Seeds, MD  tiZANidine (ZANAFLEX) 4 MG tablet Take 4 mg by mouth every 8 (eight) hours as needed for muscle spasms. As needed 05/06/19    [provider]  traMADol (ULTRAM) 50 MG tablet Take 1 tablet (50 mg total) by mouth every 6 (six) hours as needed for moderate pain. 05/02/20   Gery Pray, MD  TURMERIC PO Take 1 capsule by mouth daily with supper.    [provider]      Allergies    Chloraprep one step [chlorhexidine gluconate], Estrogens, and Shrimp [shellfish allergy]    Review of Systems   Review of Systems  Constitutional:  Negative for fever.  Respiratory:  Positive for shortness of breath. Negative  for cough.   Cardiovascular:  Positive for chest pain.  Gastrointestinal:  Negative for vomiting.    Physical Exam Updated Vital Signs BP (!) 146/64   Pulse 64   Temp 98 F (36.7 C) (Oral)   Resp 18   Ht 1.626 m (_0 )   Wt 77.1 kg   SpO2 94%   BMI 29.18 kg/m  Physical Exam CONSTITUTIONAL: Elderly, no acute distress HEAD: Normocephalic/atraumatic EYES: EOMI/PERRL ENMT: Mucous membranes moist NECK: supple no meningeal signs SPINE/BACK:entire spine nontender CV: S1/S2 noted, no murmurs/rubs/gallops noted LUNGS: Lungs are clear to auscultation bilaterally, no apparent distress Chest-no tenderness or crepitus ABDOMEN: soft, nontender NEURO: Pt is awake/alert/appropriate, moves all extremitiesx4.  No facial droop.   EXTREMITIES: pulses normal/equal, full ROM, no lower extremity edema or tenderness SKIN: warm, color normal PSYCH: no abnormalities of mood noted, alert and oriented to situation  ED Results / Procedures / Treatments   Labs (all labs ordered are listed, but only abnormal results are displayed) Labs Reviewed  BASIC METABOLIC PANEL - Abnormal; Notable for the following components:      Result Value   CO2 20 (*)    Glucose, Bld 136 (*)    Calcium 11.1 (*)    All other components within normal limits  CBC - Abnormal; Notable for the following components:   WBC 11.0 (*)    Hemoglobin 15.3 (*)    HCT 48.1 (*)    MCV 100.2 (*)    All other components within normal  limits  TROPONIN I (HIGH SENSITIVITY)  TROPONIN I (HIGH SENSITIVITY)    EKG EKG Interpretation  Date/Time:  Wednesday June 27 2021 19:55:04 EDT Ventricular Rate:  70 PR Interval:  166 QRS Duration: 84 QT Interval:  404 QTC Calculation: 436 R Axis:   -35 Text Interpretation: Normal sinus rhythm Left axis deviation Minimal voltage criteria for LVH, may be normal variant ( R in aVL ) Anterior infarct , age undetermined Abnormal ECG When compared with ECG of 29-Apr-2020 15:16, No significant change since last tracing Confirmed by Ripley Fraise [phone]) on 06/28/2021 5:57:06 AM  Radiology DG Chest 2 View  Result Date: 06/27/2021 CLINICAL DATA:  Central chest pain.  Shortness of breath. EXAM: CHEST - 2 VIEW COMPARISON:  Radiograph 08/04/2020, chest CT 01/10/2021 FINDINGS: Normal heart size with stable mediastinal contours. Ill-defined opacity in the right medial lung base. No other focal airspace disease. No pulmonary edema, pleural effusion, or pneumothorax. Surgical clips in the left breast. Postsurgical change in the cervical spine. IMPRESSION: Ill-defined right medial lung base opacity, atelectasis versus pneumonia. Electronically Signed   By: Keith Rake M.D.   On: 06/27/2021 20:34    Procedures Procedures    Medications Ordered in ED Medications - No data to display  ED Course/ Medical Decision Making/ A&P Clinical Course as of 06/28/21 0636  Thu Jun 28, 2021  0558 WBC(!): 11.0 Mild leukocytosis [DW]  0558 Glucose(!): 136 Mild hyperglycemia [DW]  6283 Patient with multiple medical conditions presents with chest pain for a year.  She reports she has had a sinus radiation therapy.  She reports it is diffusely throughout her chest.  She reports she has had it 24 hours a day 7 days a week for up to a year.she reports it felt worse yesterday.  No fevers or cough to suggest pneumonia.  Lab work is overall reassuring.  She reports she already has pain meds at home. [DW]  1517  Cardiac cath from over a year ago showed patent  stent.  I do not feel further work-up is indicated at this time [DW]    Clinical Course User Index [DW] Ripley Fraise, MD                           Medical Decision Making Amount and/or Complexity of Data Reviewed Labs: ordered. Decision-making details documented in ED Course. Radiology: ordered.   This patient presents to the ED for concern of chest pain, this involves an extensive number of treatment options, and is a complaint that carries with it a high risk of complications and morbidity.  The differential diagnosis includes but is not limited to acute coronary syndrome, aortic dissection, pulmonary embolism, pericarditis, pneumothorax, pneumonia, myocarditis, pleurisy, esophageal rupture    Comorbidities that complicate the patient evaluation: Patient's presentation is complicated by their history of Coronary artery disease, diabetes   Additional history obtained: Records reviewed previous admission documents and Primary Care Documents  Lab Tests: I Ordered, and personally interpreted labs.  The pertinent results include: Mild leukocytosis, mild hyperglycemia  Imaging Studies ordered: I ordered imaging studies including X-ray chest   I independently visualized and interpreted imaging which showed atelectasis I agree with the radiologist interpretation  Cardiac Monitoring: The patient was maintained on a cardiac monitor.  I personally viewed and interpreted the cardiac monitor which showed an underlying rhythm of:  sinus rhythm  Complexity of problems addressed: Patient's presentation is most consistent with  acute presentation with potential threat to life or bodily function  Disposition: After consideration of the diagnostic results and the patient's response to treatment,  I feel that the patent would benefit from discharge   .           Final Clinical Impression(s) / ED Diagnoses Final diagnoses:  Precordial  pain    Rx / DC Orders ED Discharge Orders     None         Ripley Fraise, MD 06/28/21 [phone]

## 2021-06-29 ENCOUNTER — Telehealth: Payer: Self-pay | Admitting: Pharmacist

## 2021-06-29 ENCOUNTER — Telehealth: Payer: Self-pay | Admitting: Physician Assistant

## 2021-06-29 NOTE — Telephone Encounter (Signed)
Pt states the gabapentin is too much and she has decided to take less.   States she will be taking one tablet ('300mg'$ ) at bedtime going forward.  Pt states she will be available for any returned call.

## 2021-06-30 ENCOUNTER — Other Ambulatory Visit: Payer: Self-pay | Admitting: Cardiovascular Disease

## 2021-07-02 NOTE — Telephone Encounter (Signed)
Noted, thanks!

## 2021-07-02 NOTE — Progress Notes (Cosign Needed)
Chronic Care Management Pharmacy Assistant   Name: Felicia [name]  MRN: [id number] DOB: [birth year]   Reason for Encounter: Diabetes Adherence Call    Recent office visits:  None since last CPP visit  Recent consult visits:  None since last CPP visit  Hospital visits:  06/27/2021 ED visit for Precordial pain -No medication changes indicated  Medications: Outpatient Encounter Medications as of 06/29/2021  Medication Sig Note   ACCU-CHEK GUIDE test strip 1 each by Other route as needed.    albuterol (VENTOLIN HFA) 108 (90 Base) MCG/ACT inhaler Inhale 2 puffs into the lungs every 6 (six) hours as needed for wheezing or shortness of breath.    anastrozole (ARIMIDEX) 1 MG tablet Take 1 tablet (1 mg total) by mouth daily.    Ascorbic Acid (VITAMIN C PO) Take 1 tablet by mouth daily.    aspirin EC 81 MG tablet Take 81 mg by mouth daily.    atorvastatin (LIPITOR) 40 MG tablet Take 1 tablet (40 mg total) by mouth daily.    bisoprolol (ZEBETA) 5 MG tablet Take 1 tablet (5 mg total) by mouth daily.    blood glucose meter kit and supplies Dispense based on patient and insurance preference. Use up to four times daily as directed. (FOR ICD-10 E10.9, E11.9).    Calcium Citrate-Vitamin D (CALCIUM + D PO) Take 1 tablet by mouth daily with supper.    CINNAMON PO Take 1 capsule by mouth daily with supper.    diclofenac sodium (VOLTAREN) 1 % GEL Apply 2-4 g topically 4 (four) times daily as needed (as directed for pain).    Dulaglutide (TRULICITY) 1.5 EE/1.0OF SOPN Inject 1.5 mg into the skin once a week.    empagliflozin (JARDIANCE) 25 MG TABS tablet Take 1 tablet (25 mg total) by mouth daily before breakfast.    escitalopram (LEXAPRO) 20 MG tablet Take 1 tablet (20 mg total) by mouth daily. 05/31/2021: Needs refill.   fenofibrate (TRICOR) 145 MG tablet Take 1 tablet (145 mg total) by mouth daily.    Ferrous Sulfate (IRON PO) Take 1 tablet by mouth daily. alternates days:1 tablet one day and 2  tablets the next day    fluticasone furoate-vilanterol (BREO ELLIPTA) 100-25 MCG/ACT AEPB Inhale 1 puff into the lungs daily.    gabapentin (NEURONTIN) 300 MG capsule Take 1 capsule (300 mg total) by mouth 2 (two) times daily.    GLIPIZIDE XL 10 MG 24 hr tablet Take 2 tablets (20 mg total) by mouth daily.    levothyroxine (SYNTHROID) 137 MCG tablet 1 TABLET IN THE MORNING ON AN EMPTY STOMACH ORALLY ONCE A DAY 90    loratadine (CLARITIN) 10 MG tablet Take 1 tablet (10 mg total) by mouth daily as needed for allergies.    metFORMIN (GLUCOPHAGE) 500 MG tablet Take 2 tablets (1,000 mg total) by mouth 2 (two) times daily with a meal. Patient takes 1 tablet in AM and 2 tablets in PM    metroNIDAZOLE (METROGEL) 1 % gel Apply 1 application topically as needed (as directed to affected area).    Multiple Vitamins-Calcium (ONE-A-DAY WOMENS PO) Take 1 tablet by mouth daily.    nitroGLYCERIN (NITROSTAT) 0.4 MG SL tablet Place 1 tablet (0.4 mg total) under the tongue every 5 (five) minutes as needed for chest pain.    omeprazole (PRILOSEC) 40 MG capsule Take 1 capsule (40 mg total) by mouth daily with supper.    prochlorperazine (COMPAZINE) 10 MG tablet TAKE 1 TABLET BY MOUTH BEFORE  MEALS AND AT BEDTIME ON DAYS 2 AND 3 AFTER CHEMOTHERAPY (COUNTING CHEMOTHERAPY DAY AS DAY 1) AFTER THAT MAY TAKE AS NEEDED    rOPINIRole (REQUIP) 0.5 MG tablet TAKE 1 TABLET BY MOUTH EVERY DAY 1 TO 3 HOURS BEFORE BEDTIME    Tiotropium Bromide Monohydrate (SPIRIVA RESPIMAT) 2.5 MCG/ACT AERS Inhale 2 puffs into the lungs daily.    tiZANidine (ZANAFLEX) 4 MG tablet Take 4 mg by mouth every 8 (eight) hours as needed for muscle spasms. As needed    traMADol (ULTRAM) 50 MG tablet Take 1 tablet (50 mg total) by mouth every 6 (six) hours as needed for moderate pain.    TURMERIC PO Take 1 capsule by mouth daily with supper.    No facility-administered encounter medications on file as of 06/29/2021.   Recent Relevant Labs: Lab Results   Component Value Date/Time   HGBA1C 9.5 (A) 05/31/2021 11:32 AM   HGBA1C 9.1 (H) 02/05/2021 01:26 PM   HGBA1C 8.3 (H) 10/06/2020 01:56 PM    Kidney Function Lab Results  Component Value Date/Time   CREATININE 0.90 06/27/2021 07:57 PM   CREATININE 0.65 04/19/2021 12:06 PM   CREATININE 0.61 01/18/2021 01:04 PM   CREATININE 0.74 11/16/2020 10:02 AM   GFR 88.66 04/19/2021 12:06 PM   GFRNONAA >60 06/27/2021 07:57 PM   GFRNONAA >60 01/18/2021 01:04 PM   GFRAA >60 10/04/2019 01:30 PM   GFRAA >60 09/08/2019 08:11 AM    Current antihyperglycemic regimen:  Trulicity 1.5 MC/9.4 mL  Jardiance 25 mg Glipizide 10 mg Metformin 500 mg  What recent interventions/DTPs have been made to improve glycemic control:  No recent interventions or DTPs.  Have there been any recent hospitalizations or ED visits since last visit with CPP? No  Patient denies hypoglycemic symptoms.  Patient denies hyperglycemic symptoms.  How often are you checking your blood sugar? 3-4 times daily  What are your blood sugars ranging?  Patient reports bedtime glucose 136, the next day after sleeping until 1 pm 192 without eating or drinking or taking any medications. Patient states she stopped taking so much Gabapentin at bedtime. She reduced it back to 300 mg at bedtime instead of 600 mg.  During the week, how often does your blood glucose drop below 70? Never Are you checking your feet daily/regularly?   Adherence Review: Is the patient currently on a STATIN medication? Yes Is the patient currently on ACE/ARB medication? No Does the patient have >5 day gap between last estimated fill dates? No  -Patient states she went back down to 300 mg from 600 mg of Gabapentin at bedtime. Patient states when she was taking 600 mg of Gabapentin she would sleep away most of the day. She states she would skip her morning medications because she would sleep through the morning until it was time to take her medications  again.  Care Gaps: Medicare Annual Wellness: Completed 04/06/2021 Ophthalmology Exam: Overdue - never done Foot Exam: Overdue - never done Hemoglobin A1C: 9.1% on 02/13/2021 Colonoscopy: Next due on 10/06/2023 Dexa Scan: Completed Mammogram: Ordered on 11/16/2020  Future Appointments  Date Time Provider [place]  07/05/2021  1:30 PM [facility] SOCIAL [place] [facility] PEC  07/09/2021  3:30 PM Margaretha Seeds, MD LBPU-PULCARE None  07/16/2021  3:00 PM Collie Siad A, PTA [facility]-SR[facility] None  08/02/2021 10:30 AM [facility] LAB [facility]MED[facility] None  08/02/2021 11:00 AM Nicholas Lose, MD [facility]MED[facility] None  08/10/2021  7:30 AM [facility] LAB [facility]  10/31/2021  2:50 PM Jaffe,  Adam R, DO LBN-LBNG None  04/19/2022  1:30 PM [facility]PC HEALTH COACH [facility]PC PEC   Star Rating Drugs: Atorvastatin 40 mg last filled 02/02/4172 35 DS Trulicity 1.5 YC/1.4 mL last filled 06/06/2021 84 DS Jardiance 25 mg last filled 06/26/2021 36 DS Glipizide 10 mg last filled 06/25/2021 40 DS Metformin 500 mg last filled 05/30/2021 57 DS  April D Calhoun, [place] Pharmacist Assistant [phone]

## 2021-07-02 NOTE — Telephone Encounter (Signed)
Please see pt note as FYI

## 2021-07-05 ENCOUNTER — Ambulatory Visit: Payer: Medicare Other | Admitting: Licensed Clinical Social Worker

## 2021-07-05 DIAGNOSIS — F419 Anxiety disorder, unspecified: Secondary | ICD-10-CM

## 2021-07-05 DIAGNOSIS — F32A Depression, unspecified: Secondary | ICD-10-CM

## 2021-07-05 NOTE — Chronic Care Management (AMB) (Cosign Needed)
Chronic Care Management   Clinical Social Work Note  07/05/2021 Name: Nura Cahoon MRN: [id number] DOB: Sep 12, 1950  Hetvi Shawhan is a 71 y.o. year old female who is a primary care patient of Allwardt, Randa Evens, PA-C. The CCM team was consulted to assist the patient with chronic disease management and/or care coordination needs related to: Grief Counseling.   Engaged with patient by telephone for follow up visit in response to provider referral for social work chronic care management and care coordination services.   Consent to Services:  The patient was given information about Chronic Care Management services, agreed to services, and gave verbal consent prior to initiation of services.  Please see initial visit note for detailed documentation.   Patient agreed to services and consent obtained.   Summary:  Patient provided with all resources to explore community support and therapy options. Services are no longer needed from this LCSW. CCM Pharmacy will continue to follow patient for ongoing needs.   Recommendation: Patient may benefit from, and is in agreement to complete SCAT application for additional transportation options and reach out to her therapist as needed.   Follow up Plan:  All care plan goals have been met. Will disconnect from care team after this encounter.  Patient does not require continued follow-up by CCM LCSW at this time. Patient has been informed to contact the office if new needs arise.  Review of patient past medical history, allergies, medications, and health status, including review of relevant consultants reports was performed today as part of a comprehensive evaluation and provision of chronic care management and care coordination services.     SDOH (Social Determinants of Health) assessments and interventions performed:    Advanced Directives Status: Not addressed in this encounter.  CCM Care Plan Conditions to be addressed/monitored: Anxiety and  Depression;   Care Plan : LCSW Plan of Care  Updates made by Maurine Cane, LCSW since 07/05/2021 12:00 AM     Problem: Coping Skills      Goal: Coping Skills Enhanced Completed 07/05/2021  Start Date: 05/31/2021  This Visit's Progress: On track  Recent Progress: On track  Priority: High  Note:   Current Barriers:  Disease Management support and education needs related to Stress  Transportation and Stress Lacks knowledge of how to connect   [place]):  Patient  will explore community resource options for unmet needs related to:  Depression   through collaboration with [place] representative, provider, and care team.   Interventions: 1:1 collaboration with primary care provider regarding development and update of comprehensive plan of care as evidenced by provider attestation and co-signature Inter-disciplinary care team collaboration (see longitudinal plan of care) Evaluation of current treatment plan related to  self management and patient's adherence to plan as established by provider Review resources, discussed options and provided patient information about  Department of Social Services ( has food stamps ) Transportation provided by insurance provider (has SCAT application and will complete and send back) Referral to care guide (for resources)   Mental Health:  (Status: Goal on Track (progressing): YES. Patient declined further engagement on this goal.) had first appointment with [facility] Evaluation of current treatment plan related to Depression: anxiety Motivational Interviewing employed Solution-Focused Strategies employed:  Active listening / Reflection utilized  Emotional Support Provided Problem [place] strategies reviewed Participation in counseling encouraged   Task & activities to accomplish goals: Complete SCAT application has been mailed Continue with compliance of taking medication  prescribed by Doctor Call to make  next appointment with [facility];  [address] [place], [place] [address]    [place], [place] Licensed Clinical Social Worker Dossie Arbour Management  [facility] [place] [phone]

## 2021-07-05 NOTE — Patient Instructions (Signed)
Visit Information  Congratulations on achieving your goals! It was a pleasure working with you, and I hope you continue to make great strides in improving your health.  No Follow up Scheduled:  You do not require continued follow-up by CCM LCSW I will disconnect from your care team at this time Please contact the office if needed  Task & activities to accomplish goals: Complete SCAT application has been mailed Continue with compliance of taking medication prescribed by Doctor Call to make next appointment with [facility];  [address] [place], [place] [address]    [place], [place] Licensed Clinical Social Worker Dossie Arbour Management  [place] [phone]    Patient verbalizes understanding of instructions and care plan provided today and agrees to view in [place]. Active MyChart status and patient understanding of how to access instructions and care plan via MyChart confirmed with patient.

## 2021-07-09 ENCOUNTER — Encounter: Payer: Self-pay | Admitting: Pulmonary Disease

## 2021-07-09 ENCOUNTER — Ambulatory Visit (INDEPENDENT_AMBULATORY_CARE_PROVIDER_SITE_OTHER): Payer: Medicare Other | Admitting: Pulmonary Disease

## 2021-07-09 VITALS — BP 108/62 | HR 61 | Temp 98.1°F | Ht 64.0 in | Wt 169.0 lb

## 2021-07-09 DIAGNOSIS — R490 Dysphonia: Secondary | ICD-10-CM

## 2021-07-09 DIAGNOSIS — J439 Emphysema, unspecified: Secondary | ICD-10-CM | POA: Diagnosis not present

## 2021-07-09 MED ORDER — GABAPENTIN 300 MG PO CAPS
300.0000 mg | ORAL_CAPSULE | Freq: Two times a day (BID) | ORAL | 2 refills | Status: DC
Start: 2021-07-09 — End: 2021-10-31

## 2021-07-09 MED ORDER — STIOLTO RESPIMAT 2.5-2.5 MCG/ACT IN AERS: 2.0000 | INHALATION_SPRAY | Freq: Every day | RESPIRATORY_TRACT | 0 refills | Status: DC

## 2021-07-10 ENCOUNTER — Encounter: Payer: Self-pay | Admitting: Pulmonary Disease

## 2021-07-13 DIAGNOSIS — Z7984 Long term (current) use of oral hypoglycemic drugs: Secondary | ICD-10-CM

## 2021-07-13 DIAGNOSIS — E1159 Type 2 diabetes mellitus with other circulatory complications: Secondary | ICD-10-CM | POA: Diagnosis not present

## 2021-07-13 DIAGNOSIS — F32A Depression, unspecified: Secondary | ICD-10-CM

## 2021-07-13 DIAGNOSIS — Z7985 Long-term (current) use of injectable non-insulin antidiabetic drugs: Secondary | ICD-10-CM

## 2021-07-13 DIAGNOSIS — E039 Hypothyroidism, unspecified: Secondary | ICD-10-CM | POA: Diagnosis not present

## 2021-07-13 DIAGNOSIS — I10 Essential (primary) hypertension: Secondary | ICD-10-CM

## 2021-07-16 ENCOUNTER — Ambulatory Visit: Payer: Medicare Other | Attending: General Surgery

## 2021-07-16 VITALS — Wt 169.1 lb

## 2021-07-16 DIAGNOSIS — Z483 Aftercare following surgery for neoplasm: Secondary | ICD-10-CM | POA: Insufficient documentation

## 2021-07-16 NOTE — Therapy (Signed)
OUTPATIENT PHYSICAL THERAPY SOZO SCREENING NOTE   Patient Name: Felicia [name] MRN: [id number] DOB:[birth year], 71 y.o., female Today's Date: 07/16/2021  PCP: Fredirick Lathe, PA-C REFERRING PROVIDER: Rolm Bookbinder, MD   PT End of Session - 07/16/21 1509     Visit Number 3   # unchanged due to screen only   PT Start Time 1507    PT Stop Time 1515    PT Time Calculation (min) 8 min    Activity Tolerance Patient tolerated treatment well    Behavior During Therapy [facility] for tasks assessed/performed             Past Medical History:  Diagnosis Date   Anemia    Anxiety    Breast cancer ([place]) 09/2019   left breast IMC   CAD (coronary artery disease)    a. 3/2007s/p DES to the LAD [facility]); b. 01/2017 MV: EF 80%, small, mild apical ant defect w/o ischemia (felt to be breast atten). Low risk.   Depression    DM (diabetes mellitus) ([place])    Essential hypertension    GERD (gastroesophageal reflux disease)    Headache    secondary to neck surgery per patient   High triglycerides    Hyperlipidemia    Hypothyroid    OA (osteoarthritis)    right knee,hands   Overflow incontinence    PONV (postoperative nausea and vomiting)    s/p gallbladder    RLS (restless legs syndrome)    Urinary urgency    Uterine cancer ([place]) dx'd 2014   Past Surgical History:  Procedure Laterality Date   BREAST LUMPECTOMY WITH RADIOACTIVE SEED AND SENTINEL LYMPH NODE BIOPSY Left 10/07/2019   Procedure: LEFT BREAST LUMPECTOMY WITH RADIOACTIVE SEED AND SENTINEL LYMPH NODE BIOPSY;  Surgeon: Rolm Bookbinder, MD;  Location: [place];  Service: General;  Laterality: Left;   CORONARY STENT PLACEMENT     CORONARY/GRAFT ACUTE MI REVASCULARIZATION     FINGER ARTHRODESIS Right 06/11/2019   Procedure: ARTHRODESIS INDEX FINGER DISTAL PHALANGEAL JOINT;  Surgeon: [name], MD;  Location: [place];  Service: Orthopedics;  Laterality: Right;   FOOT SURGERY Left     LEFT HEART CATH AND CORONARY ANGIOGRAPHY N/A 02/16/2018   Procedure: LEFT HEART CATH AND CORONARY ANGIOGRAPHY;  Surgeon: Lorretta Harp, MD;  Location: [place] CV LAB;  Service: Cardiovascular;  Laterality: N/A;   LEFT HEART CATH AND CORONARY ANGIOGRAPHY N/A 05/01/2020   Procedure: LEFT HEART CATH AND CORONARY ANGIOGRAPHY;  Surgeon: Lorretta Harp, MD;  Location: [place] CV LAB;  Service: Cardiovascular;  Laterality: N/A;   PORTACATH PLACEMENT Right 11/23/2019   Procedure: INSERTION PORT-A-CATH WITH ULTRASOUND GUIDANCE;  Surgeon: Rolm Bookbinder, MD;  Location: [place];  Service: General;  Laterality: Right;   REPLACEMENT TOTAL KNEE Right    SPINE SURGERY     TOTAL ABDOMINAL HYSTERECTOMY     FOR UTERUS CANCER   TOTAL HIP ARTHROPLASTY Left 2015   Patient Active Problem List   Diagnosis Date Noted   Frequent headaches 05/31/2021   Hoarse 01/25/2021   Hyperglycemia due to type 2 diabetes mellitus ([place]) 01/18/2021   Mixed simple and mucopurulent chronic bronchitis ([place]) 12/27/2020   Centrilobular emphysema ([place]) 07/31/2020   Shortness of breath 07/31/2020   Pressure injury of skin 04/30/2020   Hypothyroid    Anxiety    Anxiety and depression    Port-A-Cath in place 11/24/2019   Hepatic steatosis 09/28/2019   Malignant neoplasm of upper-outer  quadrant of left breast in female, estrogen receptor positive ([place]) 09/02/2019   Trigger index finger of right hand 05/05/2019   Primary osteoarthritis of first carpometacarpal joint of right hand 05/05/2019   Endometrial cancer ([place]) 10/23/2018   Cirrhosis of liver without ascites ([place]) 10/23/2018   Diarrhea 10/23/2018   Chest pain 02/15/2018   Coronary artery disease involving native coronary artery of native heart without angina pectoris 11/18/2017   Hyperlipidemia LDL goal <70 11/18/2017   Type 2 diabetes mellitus without complication, without long-term current use of insulin ([place]) 11/18/2017   Dizziness  11/18/2017    REFERRING DIAG: left breast cancer at risk for lymphedema  THERAPY DIAG:  Aftercare following surgery for neoplasm  PERTINENT HISTORY: Patient was diagnosed on 08/03/2019 with 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 She underwent a left lumpectomy and 1 positive node removed on 10/07/2019. Ki67 is 20%. She has a history of uterine cancer in 2014 treated with hysterectomy and radiation, a left hip replacement, right knee replacement, and a C4-C7 fusion in 2014.  PRECAUTIONS: left UE Lymphedema risk, None  SUBJECTIVE: Pt returns for her  3 month L-Dex screen.   PAIN:  Are you having pain? No  SOZO SCREENING: Patient was assessed today using the SOZO machine to determine the lymphedema index score. This was compared to her baseline score. It was determined that she is within the recommended range when compared to her baseline and no further action is needed at this time. She will continue SOZO screenings. These are done every 3 months for 2 years post operatively followed by every 6 months for 2 years, and then annually.    Otelia Limes, PTA 07/16/2021, 3:16 PM

## 2021-07-18 ENCOUNTER — Other Ambulatory Visit: Payer: Medicare Other

## 2021-07-18 ENCOUNTER — Ambulatory Visit: Payer: Medicare Other | Admitting: Hematology and Oncology

## 2021-07-20 ENCOUNTER — Other Ambulatory Visit: Payer: Self-pay | Admitting: Physician Assistant

## 2021-07-23 ENCOUNTER — Telehealth: Payer: Self-pay | Admitting: Pharmacist

## 2021-07-23 NOTE — Progress Notes (Signed)
Chronic Care Management Pharmacy Assistant   Name: Felicia [name]  MRN: [id number] DOB: 1950-07-11   Reason for Encounter: Medication Coordination Call    Recent office visits:  None since last adherence call  Recent consult visits:  .07/09/2021 OV (Pulmonology) Margaretha Seeds, MD; --HOLD Breo 200-25 mcg ONE puff ONCE a day. --HOLD Spiriva 2.5 mcg TWO puffs ONCE a day --START Stiolto 2.5/2.5 mcg TWO puffs ONCE a day --CONTINUE Albuterol AS NEEDED for shortness of breath or wheezing  Hospital visits:  06/27/2021 ED visit for Precordial pain -No medication changes indicated.  Medications: Outpatient Encounter Medications as of 07/23/2021  Medication Sig Note   ACCU-CHEK GUIDE test strip USE TO check blood glucose UP TO four times daily AS DIRECTED    Accu-Chek Softclix Lancets lancets USE TO check blood glucose UP TO four times daily AS DIRECTED    albuterol (VENTOLIN HFA) 108 (90 Base) MCG/ACT inhaler Inhale 2 puffs into the lungs every 6 (six) hours as needed for wheezing or shortness of breath.    anastrozole (ARIMIDEX) 1 MG tablet Take 1 tablet (1 mg total) by mouth daily.    Ascorbic Acid (VITAMIN C PO) Take 1 tablet by mouth daily.    aspirin EC 81 MG tablet Take 81 mg by mouth daily.    atorvastatin (LIPITOR) 40 MG tablet TAKE 1 TABLET BY MOUTH EVERY DAY    bisoprolol (ZEBETA) 5 MG tablet Take 1 tablet (5 mg total) by mouth daily.    blood glucose meter kit and supplies Dispense based on patient and insurance preference. Use up to four times daily as directed. (FOR ICD-10 E10.9, E11.9).    Calcium Citrate-Vitamin D (CALCIUM + D PO) Take 1 tablet by mouth daily with supper.    CINNAMON PO Take 1 capsule by mouth daily with supper.    diclofenac sodium (VOLTAREN) 1 % GEL Apply 2-4 g topically 4 (four) times daily as needed (as directed for pain).    Dulaglutide (TRULICITY) 1.5 VZ/5.6LO SOPN Inject 1.5 mg into the skin once a week.    empagliflozin (JARDIANCE) 25 MG  TABS tablet Take 1 tablet (25 mg total) by mouth daily before breakfast.    escitalopram (LEXAPRO) 20 MG tablet Take 1 tablet (20 mg total) by mouth daily. 05/31/2021: Needs refill.   fenofibrate (TRICOR) 145 MG tablet Take 1 tablet (145 mg total) by mouth daily.    Ferrous Sulfate (IRON PO) Take 1 tablet by mouth daily. alternates days:1 tablet one day and 2 tablets the next day    fluticasone furoate-vilanterol (BREO ELLIPTA) 100-25 MCG/ACT AEPB Inhale 1 puff into the lungs daily.    gabapentin (NEURONTIN) 300 MG capsule Take 1 capsule (300 mg total) by mouth in the morning and at bedtime.    GLIPIZIDE XL 10 MG 24 hr tablet Take 2 tablets (20 mg total) by mouth daily.    levothyroxine (SYNTHROID) 137 MCG tablet 1 TABLET IN THE MORNING ON AN EMPTY STOMACH ORALLY ONCE A DAY 90    loratadine (CLARITIN) 10 MG tablet Take 1 tablet (10 mg total) by mouth daily as needed for allergies.    metFORMIN (GLUCOPHAGE) 500 MG tablet Take 2 tablets (1,000 mg total) by mouth 2 (two) times daily with a meal. Patient takes 1 tablet in AM and 2 tablets in PM    metroNIDAZOLE (METROGEL) 1 % gel Apply 1 application topically as needed (as directed to affected area).    Multiple Vitamins-Calcium (ONE-A-DAY WOMENS PO) Take 1 tablet  by mouth daily.    nitroGLYCERIN (NITROSTAT) 0.4 MG SL tablet Place 1 tablet (0.4 mg total) under the tongue every 5 (five) minutes as needed for chest pain.    omeprazole (PRILOSEC) 40 MG capsule Take 1 capsule (40 mg total) by mouth daily with supper.    prochlorperazine (COMPAZINE) 10 MG tablet TAKE 1 TABLET BY MOUTH BEFORE MEALS AND AT BEDTIME ON DAYS 2 AND 3 AFTER CHEMOTHERAPY (COUNTING CHEMOTHERAPY DAY AS DAY 1) AFTER THAT MAY TAKE AS NEEDED    rOPINIRole (REQUIP) 0.5 MG tablet TAKE 1 TABLET BY MOUTH EVERY DAY 1 TO 3 HOURS BEFORE BEDTIME    Tiotropium Bromide Monohydrate (SPIRIVA RESPIMAT) 2.5 MCG/ACT AERS Inhale 2 puffs into the lungs daily.    Tiotropium Bromide-Olodaterol (STIOLTO  RESPIMAT) 2.5-2.5 MCG/ACT AERS Inhale 2 puffs into the lungs daily.    tiZANidine (ZANAFLEX) 4 MG tablet Take 4 mg by mouth every 8 (eight) hours as needed for muscle spasms. As needed    traMADol (ULTRAM) 50 MG tablet Take 1 tablet (50 mg total) by mouth every 6 (six) hours as needed for moderate pain.    TURMERIC PO Take 1 capsule by mouth daily with supper.    No facility-administered encounter medications on file as of 07/23/2021.   Reviewed chart for medication changes ahead of medication coordination call.  BP Readings from Last 3 Encounters:  07/09/21 108/62  06/28/21 (!) 146/64  05/31/21 117/71    Lab Results  Component Value Date   HGBA1C 9.5 (A) 05/31/2021     Patient obtains medications through Adherence Packaging  90 Days   Patient is due for next adherence delivery on: 08/02/2021. Called patient and reviewed medications and coordinated delivery.  This delivery to include: Fenofibrate 145 mg at BT Ropinirole 0.5 mg at BT Bisoprolol 5 mg at BT Omeprazole 40 mg at EM Escitalopram 20 mg at BT Levothyroxine 137 mcg BB Metformin 500 mg two at B and two at EM Glipizide 10 mg two at B Loratadine 10 mg at EM Anastrole 1 mg at BT Atorvastatin 40 mg at BT Gabapentin 300 mg one at B and one at EM Jardiance 25 mg BB Aspirin 81 mg at B  Confirmed delivery date of 08/02/2021, advised patient that pharmacy will contact them the morning of delivery.  Care Gaps: Medicare Annual Wellness: Completed 04/06/2021 Ophthalmology Exam: Overdue - never done Foot Exam: Overdue - never done Hemoglobin A1C: 9.5% on 05/21/2021 Colonoscopy: Next due on 10/06/2023 Dexa Scan: Completed Mammogram: Ordered on 11/16/2020  Future Appointments  Date Time Provider [place]  08/02/2021 10:30 AM [facility] None  08/02/2021 11:00 AM Nicholas Lose, MD [facility] None  08/20/2021  3:15 PM Margaretha Seeds, MD [facility] None  08/22/2021  8:45 AM [facility]  10/31/2021  2:50 PM Pieter Partridge, DO [facility] None  01/21/2022  3:00 PM Suanne Marker, PTA [facility] None  04/19/2022  1:30 PM [facility] [place]   April D [place], [place] Pharmacist Assistant [phone]

## 2021-07-24 ENCOUNTER — Telehealth: Payer: Self-pay | Admitting: Physician Assistant

## 2021-07-24 NOTE — Telephone Encounter (Signed)
tient Name: Felicia [name] Gender: Female DOB: [birth year] Age: 71 Y 5 M 20 D Return Phone Number: [phone] (Primary) Address: City/ State/ Zip: [place]  [address] Client [place] [facility] [place] Client Site [place] [facility] [place] [facility] Paediatric nurse, Freight forwarder- PA Contact Type Call Who Is Calling Patient / Member / Family / Caregiver Call Type Triage / Clinical Relationship To Patient Self Return Phone Number [phone] (Primary) Chief Complaint Blood Sugar High Reason for Call Symptomatic / Request for [place] states she has a blood glucose reading of 290. No symptoms. Answering service was the caller, she stated she will need to arrange for transportation if she needs to be seen in office. Translation No Disp. Time [name] Time) Disposition Final User 07/24/2021 3:35:19 PM Attempt made - message left Rennie Natter, [facility] 07/24/2021 3:45:32 PM Attempt made - message left Rennie Natter, [facility] 07/24/2021 4:02:24 PM FINAL ATTEMPT MADE - message left Yes Roselyn Reef RN, [facility] Final Disposition 07/24/2021 4:02:24 PM FINAL ATTEMPT MADE - message left Yes Roselyn Reef, RN, [facility]

## 2021-07-24 NOTE — Telephone Encounter (Signed)
Pt states recent blood glucose readings have been: 106 PM 07/10 213 AM 07/11 290 PM 07/11  Pt states she is not experiencing dizziness, blurry vision or increased thirst.  Pt states she has the same headache she always has.    *~*~* Transportation is needed. - if PCP triage nurse recommends OV for 07/12, let admin know immediately to try to get transportation scheduled.    Awaiting follow up notes from PCP triage nurse.

## 2021-07-25 NOTE — Telephone Encounter (Signed)
Please see triage note for patient

## 2021-07-26 ENCOUNTER — Other Ambulatory Visit: Payer: Self-pay | Admitting: Physician Assistant

## 2021-07-26 ENCOUNTER — Other Ambulatory Visit: Payer: Self-pay | Admitting: Cardiovascular Disease

## 2021-07-26 DIAGNOSIS — R079 Chest pain, unspecified: Secondary | ICD-10-CM

## 2021-07-27 ENCOUNTER — Other Ambulatory Visit: Payer: Self-pay | Admitting: Cardiovascular Disease

## 2021-08-01 ENCOUNTER — Other Ambulatory Visit: Payer: Self-pay | Admitting: *Deleted

## 2021-08-01 DIAGNOSIS — Z170 Estrogen receptor positive status [ER+]: Secondary | ICD-10-CM

## 2021-08-02 ENCOUNTER — Inpatient Hospital Stay: Payer: Medicare Other

## 2021-08-02 ENCOUNTER — Other Ambulatory Visit: Payer: Self-pay

## 2021-08-02 ENCOUNTER — Other Ambulatory Visit: Payer: Self-pay | Admitting: Physician Assistant

## 2021-08-02 ENCOUNTER — Inpatient Hospital Stay: Payer: Medicare Other | Attending: Hematology and Oncology | Admitting: Hematology and Oncology

## 2021-08-02 ENCOUNTER — Other Ambulatory Visit: Payer: Medicare Other

## 2021-08-02 VITALS — BP 119/69 | HR 76 | Temp 97.2°F | Resp 18 | Ht 64.0 in | Wt 170.7 lb

## 2021-08-02 DIAGNOSIS — C50412 Malignant neoplasm of upper-outer quadrant of left female breast: Secondary | ICD-10-CM | POA: Diagnosis present

## 2021-08-02 DIAGNOSIS — I6782 Cerebral ischemia: Secondary | ICD-10-CM | POA: Insufficient documentation

## 2021-08-02 DIAGNOSIS — M898X9 Other specified disorders of bone, unspecified site: Secondary | ICD-10-CM | POA: Insufficient documentation

## 2021-08-02 DIAGNOSIS — Z79811 Long term (current) use of aromatase inhibitors: Secondary | ICD-10-CM | POA: Diagnosis not present

## 2021-08-02 DIAGNOSIS — M1990 Unspecified osteoarthritis, unspecified site: Secondary | ICD-10-CM | POA: Diagnosis not present

## 2021-08-02 DIAGNOSIS — Z79899 Other long term (current) drug therapy: Secondary | ICD-10-CM | POA: Diagnosis not present

## 2021-08-02 DIAGNOSIS — Z170 Estrogen receptor positive status [ER+]: Secondary | ICD-10-CM | POA: Insufficient documentation

## 2021-08-02 DIAGNOSIS — R079 Chest pain, unspecified: Secondary | ICD-10-CM

## 2021-08-02 DIAGNOSIS — Z90722 Acquired absence of ovaries, bilateral: Secondary | ICD-10-CM | POA: Diagnosis not present

## 2021-08-02 DIAGNOSIS — E119 Type 2 diabetes mellitus without complications: Secondary | ICD-10-CM | POA: Insufficient documentation

## 2021-08-02 LAB — CBC WITH DIFFERENTIAL (CANCER CENTER ONLY)
Abs Immature Granulocytes: 0.02 10*3/uL (ref 0.00–0.07)
Basophils Absolute: 0 10*3/uL (ref 0.0–0.1)
Basophils Relative: 0 %
Eosinophils Absolute: 0.1 10*3/uL (ref 0.0–0.5)
Eosinophils Relative: 1 %
HCT: 41.9 % (ref 36.0–46.0)
Hemoglobin: 13.8 g/dL (ref 12.0–15.0)
Immature Granulocytes: 0 %
Lymphocytes Relative: 69 %
Lymphs Abs: 7.6 10*3/uL — ABNORMAL HIGH (ref 0.7–4.0)
MCH: 31.8 pg (ref 26.0–34.0)
MCHC: 32.9 g/dL (ref 30.0–36.0)
MCV: 96.5 fL (ref 80.0–100.0)
Monocytes Absolute: 0.5 10*3/uL (ref 0.1–1.0)
Monocytes Relative: 5 %
Neutro Abs: 2.8 10*3/uL (ref 1.7–7.7)
Neutrophils Relative %: 25 %
Platelet Count: 216 10*3/uL (ref 150–400)
RBC: 4.34 MIL/uL (ref 3.87–5.11)
RDW: 13.7 % (ref 11.5–15.5)
Smear Review: NORMAL
WBC Count: 11.1 10*3/uL — ABNORMAL HIGH (ref 4.0–10.5)
nRBC: 0 % (ref 0.0–0.2)

## 2021-08-02 LAB — CMP (CANCER CENTER ONLY)
ALT: 36 U/L (ref 0–44)
AST: 31 U/L (ref 15–41)
Albumin: 4.4 g/dL (ref 3.5–5.0)
Alkaline Phosphatase: 52 U/L (ref 38–126)
Anion gap: 9 (ref 5–15)
BUN: 18 mg/dL (ref 8–23)
CO2: 26 mmol/L (ref 22–32)
Calcium: 11.2 mg/dL — ABNORMAL HIGH (ref 8.9–10.3)
Chloride: 103 mmol/L (ref 98–111)
Creatinine: 0.83 mg/dL (ref 0.44–1.00)
GFR, Estimated: >60 mL/min (ref >60)
Glucose, Bld: 266 mg/dL — ABNORMAL HIGH (ref 70–99)
Potassium: 4.8 mmol/L (ref 3.5–5.1)
Sodium: 138 mmol/L (ref 135–145)
Total Bilirubin: 0.3 mg/dL (ref 0.3–1.2)
Total Protein: 7 g/dL (ref 6.5–8.1)

## 2021-08-02 MED ORDER — CELECOXIB 200 MG PO CAPS: 200.0000 mg | ORAL_CAPSULE | Freq: Every day | ORAL | Status: DC

## 2021-08-02 NOTE — Progress Notes (Signed)
Patient Care Team: Allwardt, Randa Evens, PA-C as PCP - General (Physician Assistant) Nahser, [name], MD as PCP - Cardiology (Cardiology) Rolm Bookbinder, MD as Consulting Physician (General Surgery) Eppie Gibson, MD as Attending Physician (Radiation Oncology) [name], MD as Consulting Physician (Rheumatology) [name], MD as Consulting Physician (Orthopedic Surgery) Madelin Rear, MD (Inactive) as Consulting Physician (Endocrinology) Margaretha Seeds, MD as Consulting Physician (Pulmonary Disease) Nicholas Lose, MD as Consulting Physician (Hematology and Oncology) Edythe Clarity, [facility] as Pharmacist (Pharmacist)  DIAGNOSIS:  Encounter Diagnosis  Name Primary?   Malignant neoplasm of upper-outer quadrant of left breast in female, estrogen receptor positive ([place]) Yes    SUMMARY OF ONCOLOGIC HISTORY: Oncology History Overview Note   Cancer Staging  Malignant neoplasm of upper-outer quadrant of left breast in female, estrogen receptor positive ([place]) Staging form: Breast, AJCC 8th Edition - Clinical stage from 09/08/2019: Stage IA (cT1c, cN0, cM0, G2, ER+, PR+, HER2+) - Unsigned - Pathologic stage from 10/07/2019: Stage IA (pT1c, pN1, cM0, G2, ER+, PR+, HER2+) - Signed by Gardenia Phlegm, NP on 01/09/2021    Malignant neoplasm of upper-outer quadrant of left breast in female, estrogen receptor positive ([place])  08/30/2019 Initial Biopsy   Diagnosis Breast, left, needle core biopsy, 2 o'clock, 10cmfn - INVASIVE MAMMARY CARCINOMA. Microscopic Comment The carcinoma is nuclear grade 2. Micropapillary features are noted. The greatest linear extent of tumor in any one core is 6 mm. ADDENDUM: Immunohistochemistry for E-cadherin is positive supporting a ductal phenotype.  PROGNOSTIC INDICATORS Results: The tumor cells are EQUIVOCAL for Her2 (2+). Her2 by FISH will be performed and results reported separately. Estrogen Receptor: 100%, POSITIVE, STRONG STAINING  INTENSITY Progesterone Receptor: 40%, POSITIVE, STRONG STAINING INTENSITY Proliferation Marker Ki67: 20%  FLUORESCENCE IN-SITU HYBRIDIZATION Results: GROUP 1: HER2 **POSITIVE**   09/02/2019 Initial Diagnosis   Malignant neoplasm of upper-outer quadrant of left breast in female, estrogen receptor positive ([place])   09/30/2019 - 09/30/2019 Chemotherapy   The patient had trastuzumab-dkst (OGIVRI) 630 mg in sodium chloride 0.9 % 250 mL chemo infusion, 8 mg/kg, Intravenous,  Once, 0 of 5 cycles  for chemotherapy treatment.    10/07/2019 Definitive Surgery   FINAL MICROSCOPIC DIAGNOSIS:   A. BREAST, LEFT, LUMPECTOMY:  -  Invasive ductal carcinoma, Nottingham grade 2 of 3, 1.4 cm  -  Ductal carcinoma in-situ, high grade  -  Margins uninvolved by carcinoma (see parts C, D and E)  -  Previous biopsy site changes present  -  See oncology table and comment below   B. LYMPH NODE, LEFT AXILLARY, SENTINEL, EXCISION:  -  Metastatic carcinoma involving one lymph node (1/1), 0.3 cm  -  Focal extracapsular extension   C. BREAST, LEFT, ADDITIONAL MEDIAL MARGIN, EXCISION:  -  Usual ductal hyperplasia  -  No residual carcinoma identified  -  See comment   D. BREAST, LEFT, ADDITIONAL INFERIOR MARGIN, EXCISION:  -  No residual carcinoma identified   E. BREAST, LEFT, ADDITIONAL POSTERIOR MARGIN, EXCISION:  -  No residual carcinoma identified   COMMENT:   A.  The carcinoma has micropapillary features.   C.  Cytokeratin 5/6 and E-cadherin are positive in the usual ductal  hyperplasia.    11/24/2019 - 01/28/2020 Chemotherapy   adjuvant chemotherapy consisting of carboplatin, gemcitabine, trastuzumab given every 3 weeks x4  -discontinued after 4 cycles with poor tolerance.        02/16/2020 - 11/16/2020 Chemotherapy   Patient is on Treatment Plan : BREAST ADO-Trastuzumab Emtansine (Kadcyla)  q21d      03/30/2020 - 05/12/2020 Radiation Therapy   Site Technique Total Dose (Gy) Dose per Fx (Gy)  Completed Fx Beam Energies  Breast, Left: Breast_Lt 3D 50/50 2 25/25 10X  Breast, Left: Breast_Lt_SCV_PAB 3D 50/50 2 25/25 10X, 15X  Breast, Left: Breast_Lt_Bst 3D 10/10 2 5/5 6X, 10X     04/11/2020 Imaging   EXAM: MRI HEAD WITHOUT AND WITH CONTRAST  IMPRESSION: 1. No evidence of intracranial metastatic disease. 2. Mild chronic small vessel ischemia.   11/16/2020 -  Anti-estrogen oral therapy   Anastrozole, 1 mg daily     CHIEF COMPLIANT: Follow-up on anastrozole and establish oncology care with Dr. Lindi Adie  INTERVAL HISTORY: Felicia [name] is a 71 yo with the above mention on anastrozole. She presents to the clinic today for a follow-up and establish oncology care with Dr. Lindi Adie. She denies hot flashes. She does get joint aches and pain mainly in wrist. Complains of chest and back pain. She complains of shortening of breath. Complains of her sugar levels under control. She states that she has lost weight. She doesn't walk because she doesn't have any one to walk with afraid of falling.  ALLERGIES:  is allergic to chloraprep one step [chlorhexidine gluconate], estrogens, and shrimp [shellfish allergy].  MEDICATIONS:  Current Outpatient Medications  Medication Sig Dispense Refill   celecoxib (CELEBREX) 200 MG capsule Take 1 capsule (200 mg total) by mouth daily.     ACCU-CHEK GUIDE test strip USE TO check blood glucose UP TO four times daily AS DIRECTED 100 strip 0   Accu-Chek Softclix Lancets lancets USE TO check blood glucose UP TO four times daily AS DIRECTED 100 each 0   anastrozole (ARIMIDEX) 1 MG tablet Take 1 tablet (1 mg total) by mouth daily. 90 tablet 4   Ascorbic Acid (VITAMIN C PO) Take 1 tablet by mouth daily.     aspirin EC 81 MG tablet Take 81 mg by mouth daily.     atorvastatin (LIPITOR) 40 MG tablet TAKE ONE TABLET BY MOUTH EVERYDAY AT BEDTIME 90 tablet 2   bisoprolol (ZEBETA) 5 MG tablet TAKE ONE TABLET BY MOUTH EVERYDAY AT BEDTIME 90 tablet 1   blood glucose meter  kit and supplies Dispense based on patient and insurance preference. Use up to four times daily as directed. (FOR ICD-10 E10.9, E11.9). 1 each 0   Calcium Citrate-Vitamin D (CALCIUM + D PO) Take 1 tablet by mouth daily with supper.     CINNAMON PO Take 1 capsule by mouth daily with supper.     diclofenac sodium (VOLTAREN) 1 % GEL Apply 2-4 g topically 4 (four) times daily as needed (as directed for pain).     Dulaglutide (TRULICITY) 1.5 GG/2.6RS SOPN Inject 1.5 mg into the skin once a week. 2 mL 2   empagliflozin (JARDIANCE) 25 MG TABS tablet Take 1 tablet (25 mg total) by mouth daily before breakfast. 90 tablet 1   escitalopram (LEXAPRO) 20 MG tablet Take 1 tablet (20 mg total) by mouth daily. 90 tablet 1   fenofibrate (TRICOR) 145 MG tablet Take 1 tablet (145 mg total) by mouth daily. 90 tablet 0   Ferrous Sulfate (IRON PO) Take 1 tablet by mouth daily. alternates days:1 tablet one day and 2 tablets the next day     fluticasone furoate-vilanterol (BREO ELLIPTA) 100-25 MCG/ACT AEPB Inhale 1 puff into the lungs daily. 30 each 11   gabapentin (NEURONTIN) 300 MG capsule Take 1 capsule (300 mg total) by mouth  in the morning and at bedtime. 60 capsule 2   GLIPIZIDE XL 10 MG 24 hr tablet Take 2 tablets (20 mg total) by mouth daily. 90 tablet 3   levothyroxine (SYNTHROID) 137 MCG tablet 1 TABLET IN THE MORNING ON AN EMPTY STOMACH ORALLY ONCE A DAY 90 90 tablet 1   loratadine (CLARITIN) 10 MG tablet Take 1 tablet (10 mg total) by mouth daily as needed for allergies. 30 tablet 11   metFORMIN (GLUCOPHAGE) 500 MG tablet Take 2 tablets (1,000 mg total) by mouth 2 (two) times daily with a meal. Patient takes 1 tablet in AM and 2 tablets in PM 270 tablet 1   metroNIDAZOLE (METROGEL) 1 % gel Apply 1 application topically as needed (as directed to affected area). 45 g 2   Multiple Vitamins-Calcium (ONE-A-DAY WOMENS PO) Take 1 tablet by mouth daily.     nitroGLYCERIN (NITROSTAT) 0.4 MG SL tablet Place 1 tablet (0.4  mg total) under the tongue every 5 (five) minutes as needed for chest pain. 25 tablet 6   omeprazole (PRILOSEC) 40 MG capsule Take 1 capsule (40 mg total) by mouth daily with supper. 90 capsule 1   prochlorperazine (COMPAZINE) 10 MG tablet TAKE 1 TABLET BY MOUTH BEFORE MEALS AND AT BEDTIME ON DAYS 2 AND 3 AFTER CHEMOTHERAPY (COUNTING CHEMOTHERAPY DAY AS DAY 1) AFTER THAT MAY TAKE AS NEEDED 30 tablet 1   rOPINIRole (REQUIP) 0.5 MG tablet TAKE ONE TABLET BY MOUTH EVERYDAY AT BEDTIME 90 tablet 0   tiZANidine (ZANAFLEX) 4 MG tablet Take 4 mg by mouth every 8 (eight) hours as needed for muscle spasms. As needed     traMADol (ULTRAM) 50 MG tablet Take 1 tablet (50 mg total) by mouth every 6 (six) hours as needed for moderate pain. 30 tablet 0   TURMERIC PO Take 1 capsule by mouth daily with supper.     No current facility-administered medications for this visit.    PHYSICAL EXAMINATION: ECOG PERFORMANCE STATUS: 1 - Symptomatic but completely ambulatory  Vitals:   08/02/21 1043  BP: 119/69  Pulse: 76  Resp: 18  Temp: (!) 97.2 F (36.2 C)  SpO2: 96%   Filed Weights   08/02/21 1043  Weight: 170 lb 11.2 oz (77.4 kg)      LABORATORY DATA:  I have reviewed the data as listed    Latest Ref Rng & Units 08/02/2021   10:36 AM 06/27/2021    7:57 PM 04/19/2021   12:06 PM  CMP  Glucose 70 - 99 mg/dL 266  136  211   BUN 8 - 23 mg/dL 18  19  16    Creatinine 0.44 - 1.00 mg/dL 0.83  0.90  0.65   Sodium 135 - 145 mmol/L 138  139  139   Potassium 3.5 - 5.1 mmol/L 4.8  4.7  4.7   Chloride 98 - 111 mmol/L 103  105  101   CO2 22 - 32 mmol/L 26  20  29    Calcium 8.9 - 10.3 mg/dL 11.2  11.1  10.4   Total Protein 6.5 - 8.1 g/dL 7.0   6.9   Total Bilirubin 0.3 - 1.2 mg/dL 0.3   0.6   Alkaline Phos 38 - 126 U/L 52   58   AST 15 - 41 U/L 31   23   ALT 0 - 44 U/L 36   25     Lab Results  Component Value Date   WBC 11.1 (H) 08/02/2021   HGB  13.8 08/02/2021   HCT 41.9 08/02/2021   MCV 96.5 08/02/2021    PLT 216 08/02/2021   NEUTROABS 2.8 08/02/2021    ASSESSMENT & PLAN:  Malignant neoplasm of upper-outer quadrant of left breast in female, estrogen receptor positive ([place]) 08/30/2019: Clinical T1CN0 stage Ia grade 2 IDC ER/PR positive HER2 positive Ki-67 20% 2014: TAH/BSO for grade 3 stage I endometrial cancer status post vaginal brachytherapy 10/07/2019: Left lumpectomy: T1CN1 stage Ib grade 2 IDC with negative margins 11/24/2019-01/26/2020: Adjuvant chemo with carboplatin, gemcitabine, trastuzumab every 3 weeks x 6 02/16/2020-November 2022: TDM 1 03/30/2020-05/12/2020: Adjuvant radiation  Current treatment: Anastrozole started 11/16/2020 Anastrozole toxicities: Tolerating it fairly well without any major side effects but denies any hot flashes.  Muscle aches and pains and bone pain: We will obtain CT chest abdomen pelvis and a bone scan to evaluate for metastatic disease.  I will call her with the results of this test.  Diabetes: Patient is trying hard to get it under control.  She has lost significant amount of weight over the past several years.   Breast cancer surveillance: mammograms 08/16/2020: Benign Bone density 03/19/2021: T score -0.7: Normal  Hypercalcemia: I encouraged her to stop the calcium supplementation.  If it still remains high then she might need hyperparathyroidism work-up.  Return to clinic in 1 year for follow-up   Orders Placed This Encounter  Procedures   CT CHEST ABDOMEN PELVIS W CONTRAST    Standing Status:   Future    Standing Expiration Date:   08/03/2022    Order Specific Question:   Preferred imaging location?    Answer:   [facility]    Order Specific Question:   Is Oral Contrast requested for this exam?    Answer:   Yes, Per Radiology protocol   NM Bone Scan Whole Body    Standing Status:   Future    Standing Expiration Date:   08/03/2022    Order Specific Question:   If indicated for the ordered procedure, I authorize the administration of a  radiopharmaceutical per Radiology protocol    Answer:   Yes    Order Specific Question:   Preferred imaging location?    Answer:   [facility]   The patient has a good understanding of the overall plan. she agrees with it. she will call with any problems that may develop before the next visit here. Total time spent: 30 mins including face to face time and time spent for planning, charting and co-ordination of care   Harriette Ohara, MD 08/02/21    I Gardiner Coins am scribing for Dr. Lindi Adie  I have reviewed the above documentation for accuracy and completeness, and I agree with the above.

## 2021-08-02 NOTE — Assessment & Plan Note (Addendum)
08/30/2019: Clinical T1CN0 stage Ia grade 2 IDC ER/PR positive HER2 positive Ki-67 20% 2014: TAH/BSO for grade 3 stage I endometrial cancer status post vaginal brachytherapy 10/07/2019: Left lumpectomy: T1CN1 stage Ib grade 2 IDC with negative margins 11/24/2019-01/26/2020: Adjuvant chemo with carboplatin, gemcitabine, trastuzumab every 3 weeks x 6 02/16/2020-November 2022: TDM 1 03/30/2020-05/12/2020: Adjuvant radiation  Current treatment: Anastrozole started 11/16/2020 Anastrozole toxicities: Tolerating it fairly well without any major side effects but denies any hot flashes.  Muscle aches and pains and bone pain: We will obtain CT chest abdomen pelvis and a bone scan to evaluate for metastatic disease.  I will call her with the results of this test.  Diabetes: Patient is trying hard to get it under control.  She has lost significant amount of weight over the past several years.   Breast cancer surveillance: mammograms 08/16/2020: Benign Bone density 03/19/2021: T score -0.7: Normal  Hypercalcemia: I encouraged her to stop the calcium supplementation.  If it still remains high then she might need hyperparathyroidism work-up.  Return to clinic in 1 year for follow-up

## 2021-08-06 ENCOUNTER — Other Ambulatory Visit: Payer: Self-pay | Admitting: Physician Assistant

## 2021-08-09 ENCOUNTER — Ambulatory Visit (HOSPITAL_COMMUNITY)
Admission: RE | Admit: 2021-08-09 | Discharge: 2021-08-09 | Disposition: A | Discharge status: Home / Self Care | Payer: Medicare Other | Source: Ambulatory Visit | Attending: Hematology and Oncology | Admitting: Hematology and Oncology

## 2021-08-09 ENCOUNTER — Encounter (HOSPITAL_COMMUNITY): Payer: Self-pay

## 2021-08-09 DIAGNOSIS — Z170 Estrogen receptor positive status [ER+]: Secondary | ICD-10-CM | POA: Insufficient documentation

## 2021-08-09 DIAGNOSIS — C50412 Malignant neoplasm of upper-outer quadrant of left female breast: Secondary | ICD-10-CM | POA: Diagnosis not present

## 2021-08-09 MED ORDER — IOHEXOL 300 MG/ML  SOLN
100.0000 mL | Freq: Once | INTRAMUSCULAR | Status: AC | PRN
  Administered 2021-08-09: 100 mL via INTRAVENOUS

## 2021-08-09 MED ORDER — SODIUM CHLORIDE (PF) 0.9 % IJ SOLN
INTRAMUSCULAR | Status: AC
  Filled 2021-08-09: qty 50

## 2021-08-09 NOTE — Progress Notes (Signed)
HEMATOLOGY-ONCOLOGY TELEPHONE VISIT PROGRESS NOTE  I connected with our patient on 08/13/21 at  3:30 PM EDT by telephone and verified that I am speaking with the correct person using two identifiers.  I discussed the limitations, risks, security and privacy concerns of performing an evaluation and management service by telephone and the availability of in person appointments.  I also discussed with the patient that there may be a patient responsible charge related to this service. The patient expressed understanding and agreed to proceed.   History of Present Illness: Felicia [name] is a 71 yo with the above mention on anastrozole. She presents to the clinic today for a telephone follow-up.  She underwent CT scans and bone scans for evaluation of her diffuse body aches and pains and is here today by telephone to discuss the results.  Oncology History Overview Note   Cancer Staging  Malignant neoplasm of upper-outer quadrant of left breast in female, estrogen receptor positive ([place]) Staging form: Breast, AJCC 8th Edition - Clinical stage from 09/08/2019: Stage IA (cT1c, cN0, cM0, G2, ER+, PR+, HER2+) - Unsigned - Pathologic stage from 10/07/2019: Stage IA (pT1c, pN1, cM0, G2, ER+, PR+, HER2+) - Signed by Gardenia Phlegm, NP on 01/09/2021    Malignant neoplasm of upper-outer quadrant of left breast in female, estrogen receptor positive ([place])  08/30/2019 Initial Biopsy   Diagnosis Breast, left, needle core biopsy, 2 o'clock, 10cmfn - INVASIVE MAMMARY CARCINOMA. Microscopic Comment The carcinoma is nuclear grade 2. Micropapillary features are noted. The greatest linear extent of tumor in any one core is 6 mm. ADDENDUM: Immunohistochemistry for E-cadherin is positive supporting a ductal phenotype.  PROGNOSTIC INDICATORS Results: The tumor cells are EQUIVOCAL for Her2 (2+). Her2 by FISH will be performed and results reported separately. Estrogen Receptor: 100%, POSITIVE, STRONG STAINING  INTENSITY Progesterone Receptor: 40%, POSITIVE, STRONG STAINING INTENSITY Proliferation Marker Ki67: 20%  FLUORESCENCE IN-SITU HYBRIDIZATION Results: GROUP 1: HER2 **POSITIVE**   09/02/2019 Initial Diagnosis   Malignant neoplasm of upper-outer quadrant of left breast in female, estrogen receptor positive ([place])   09/30/2019 - 09/30/2019 Chemotherapy   The patient had trastuzumab-dkst (OGIVRI) 630 mg in sodium chloride 0.9 % 250 mL chemo infusion, 8 mg/kg, Intravenous,  Once, 0 of 5 cycles  for chemotherapy treatment.    10/07/2019 Definitive Surgery   FINAL MICROSCOPIC DIAGNOSIS:   A. BREAST, LEFT, LUMPECTOMY:  -  Invasive ductal carcinoma, Nottingham grade 2 of 3, 1.4 cm  -  Ductal carcinoma in-situ, high grade  -  Margins uninvolved by carcinoma (see parts C, D and E)  -  Previous biopsy site changes present  -  See oncology table and comment below   B. LYMPH NODE, LEFT AXILLARY, SENTINEL, EXCISION:  -  Metastatic carcinoma involving one lymph node (1/1), 0.3 cm  -  Focal extracapsular extension   C. BREAST, LEFT, ADDITIONAL MEDIAL MARGIN, EXCISION:  -  Usual ductal hyperplasia  -  No residual carcinoma identified  -  See comment   D. BREAST, LEFT, ADDITIONAL INFERIOR MARGIN, EXCISION:  -  No residual carcinoma identified   E. BREAST, LEFT, ADDITIONAL POSTERIOR MARGIN, EXCISION:  -  No residual carcinoma identified   COMMENT:   A.  The carcinoma has micropapillary features.   C.  Cytokeratin 5/6 and E-cadherin are positive in the usual ductal  hyperplasia.    11/24/2019 - 01/28/2020 Chemotherapy   adjuvant chemotherapy consisting of carboplatin, gemcitabine, trastuzumab given every 3 weeks x4  -discontinued after 4 cycles with poor tolerance.  02/16/2020 - 11/16/2020 Chemotherapy   Patient is on Treatment Plan : BREAST ADO-Trastuzumab Emtansine (Kadcyla) q21d      03/30/2020 - 05/12/2020 Radiation Therapy   Site Technique Total Dose (Gy) Dose per Fx (Gy)  Completed Fx Beam Energies  Breast, Left: Breast_Lt 3D 50/50 2 25/25 10X  Breast, Left: Breast_Lt_SCV_PAB 3D 50/50 2 25/25 10X, 15X  Breast, Left: Breast_Lt_Bst 3D 10/10 2 5/5 6X, 10X     04/11/2020 Imaging   EXAM: MRI HEAD WITHOUT AND WITH CONTRAST  IMPRESSION: 1. No evidence of intracranial metastatic disease. 2. Mild chronic small vessel ischemia.   11/16/2020 -  Anti-estrogen oral therapy   Anastrozole, 1 mg daily     REVIEW OF SYSTEMS:   Constitutional: Denies fevers, chills or abnormal weight loss All other systems were reviewed with the patient and are negative. Observations/Objective:     Assessment Plan:  Malignant neoplasm of upper-outer quadrant of left breast in female, estrogen receptor positive ([place]) 08/30/2019: Clinical T1CN0 stage Ia grade 2 IDC ER/PR positive HER2 positive Ki-67 20% 2014: TAH/BSO for grade 3 stage I endometrial cancer status post vaginal brachytherapy 10/07/2019: Left lumpectomy: T1CN1 stage Ib grade 2 IDC with negative margins 11/24/2019-01/26/2020: Adjuvant chemo with carboplatin, gemcitabine, trastuzumab every 3 weeks x 6 02/16/2020-November 2022: TDM 1 03/30/2020-05/12/2020: Adjuvant radiation   Current treatment: Anastrozole started 11/16/2020 Anastrozole toxicities: Tolerating it fairly well without any major side effects but denies any hot flashes.   Muscle aches and pains and bone pain:  CT CAP 08/09/2021: No evidence of metastatic disease Bone scan 08/11/2021: Nonspecific uptake left humeral head recommended an x-ray of the region.  Degenerative changes She informed that she has had shoulder imaging studies done at her rheumatologist office and she will check on her left shoulder. We can attribute most of her symptoms to degenerative changes and osteoarthritis.  Diabetes: Patient is trying hard to get it under control.  She has lost significant amount of weight over the past several years.     Breast cancer surveillance: mammograms  08/16/2020: Benign Bone density 03/19/2021: T score -0.7: Normal   Hypercalcemia: Stopped calcium supplementation.  If it still remains high then she might need hyperparathyroidism work-up.   Return to clinic in 1 year for follow-up    I discussed the assessment and treatment plan with the patient. The patient was provided an opportunity to ask questions and all were answered. The patient agreed with the plan and demonstrated an understanding of the instructions. The patient was advised to call back or seek an in-person evaluation if the symptoms worsen or if the condition fails to improve as anticipated.   I provided 12 minutes of non-face-to-face time during this encounter.  This includes time for charting and coordination of care   Harriette Ohara, MD   I Gardiner Coins am scribing for Dr. Lindi Adie  I have reviewed the above documentation for accuracy and completeness, and I agree with the above.

## 2021-08-10 ENCOUNTER — Other Ambulatory Visit: Payer: Medicare Other

## 2021-08-10 ENCOUNTER — Encounter (HOSPITAL_COMMUNITY)
Admission: RE | Admit: 2021-08-10 | Discharge: 2021-08-10 | Disposition: A | Discharge status: Home / Self Care | Payer: Medicare Other | Source: Ambulatory Visit | Attending: Hematology and Oncology | Admitting: Hematology and Oncology

## 2021-08-10 DIAGNOSIS — Z170 Estrogen receptor positive status [ER+]: Secondary | ICD-10-CM | POA: Insufficient documentation

## 2021-08-10 DIAGNOSIS — C50412 Malignant neoplasm of upper-outer quadrant of left female breast: Secondary | ICD-10-CM | POA: Diagnosis present

## 2021-08-10 MED ORDER — TECHNETIUM TC 99M MEDRONATE IV KIT
20.0000 | PACK | Freq: Once | INTRAVENOUS | Status: AC | PRN
  Administered 2021-08-10: 20 via INTRAVENOUS

## 2021-08-12 ENCOUNTER — Other Ambulatory Visit: Payer: Self-pay | Admitting: Physician Assistant

## 2021-08-13 ENCOUNTER — Inpatient Hospital Stay (HOSPITAL_BASED_OUTPATIENT_CLINIC_OR_DEPARTMENT_OTHER): Payer: Medicare Other | Admitting: Hematology and Oncology

## 2021-08-13 DIAGNOSIS — C50412 Malignant neoplasm of upper-outer quadrant of left female breast: Secondary | ICD-10-CM | POA: Diagnosis not present

## 2021-08-13 DIAGNOSIS — Z170 Estrogen receptor positive status [ER+]: Secondary | ICD-10-CM

## 2021-08-13 NOTE — Assessment & Plan Note (Signed)
08/30/2019: Clinical T1CN0 stage Ia grade 2 IDC ER/PR positive HER2 positive Ki-67 20% 2014: TAH/BSO for grade 3 stage I endometrial cancer status post vaginal brachytherapy 10/07/2019: Left lumpectomy: T1CN1 stage Ib grade 2 IDC with negative margins 11/24/2019-01/26/2020: Adjuvant chemo with carboplatin, gemcitabine, trastuzumab every 3 weeks x 6 02/16/2020-November 2022: TDM 1 03/30/2020-05/12/2020: Adjuvant radiation  Current treatment: Anastrozole started 11/16/2020 Anastrozole toxicities: Tolerating it fairly well without any major side effects but denies any hot flashes.  Muscle aches and pains and bone pain:  CT CAP 08/09/2021: No evidence of metastatic disease Bone scan 08/11/2021: Nonspecific uptake left humeral head recommended an x-ray of the region.  Degenerative changes  We will get an x-ray of the left shoulder today.  Otherwise there is no evidence of metastatic disease.  Diabetes: Patient is trying hard to get it under control.  She has lost significant amount of weight over the past several years.   Breast cancer surveillance: mammograms 08/16/2020: Benign Bone density 03/19/2021: T score -0.7: Normal  Hypercalcemia: Stopped calcium supplementation.  If it still remains high then she might need hyperparathyroidism work-up.  Return to clinic in 1 year for follow-up

## 2021-08-14 ENCOUNTER — Telehealth: Payer: Self-pay | Admitting: Physician Assistant

## 2021-08-14 ENCOUNTER — Telehealth: Payer: Self-pay

## 2021-08-14 NOTE — Telephone Encounter (Signed)
Noted and agreed, thank you for update.

## 2021-08-14 NOTE — Telephone Encounter (Signed)
Patient states she wants [name] to know that she had a CT done on 08/09/21 and on Bone Scan on 08/10/21 (which Patient states are available on MyChart). Patient states she is seeing her Pulmonologist on 08/20/21, having a Mammogram and having Labs work done for her Cardiologist on 08/22/21. Patient states she is seeing her Rheumatologist on  08/23/21 (who will be doing an Xray on Patient's left shoulder-per Oncologist recommendation. Patient states CT scan showed no cancer-per Oncologist.

## 2021-08-14 NOTE — Telephone Encounter (Signed)
Pt called to request copies of bone scan, CT CAP and most recent office note be faxed to Dr Trudie Reed office. Per pt request, all above faxed. Bone scan and CT CAP also mailed to pt per her request. She knows to call for any further concerns.

## 2021-08-14 NOTE — Telephone Encounter (Signed)
Please see patient message as Juluis Rainier

## 2021-08-20 ENCOUNTER — Encounter: Payer: Self-pay | Admitting: Pulmonary Disease

## 2021-08-20 ENCOUNTER — Ambulatory Visit: Payer: Medicare Other | Admitting: Pulmonary Disease

## 2021-08-20 VITALS — BP 122/68 | HR 73 | Temp 98.2°F | Ht 64.0 in | Wt 172.8 lb

## 2021-08-20 DIAGNOSIS — J432 Centrilobular emphysema: Secondary | ICD-10-CM

## 2021-08-20 MED ORDER — STIOLTO RESPIMAT 2.5-2.5 MCG/ACT IN AERS: 2.0000 | INHALATION_SPRAY | Freq: Every day | RESPIRATORY_TRACT | 5 refills | Status: DC

## 2021-08-20 NOTE — Patient Instructions (Signed)
Emphysema --CONTINUE Stiolto 2.5/2.5 mcg TWO puffs ONCE a day --CONTINUE Albuterol AS NEEDED for shortness of breath or wheezing.  --START mucinex as needed for chest congestion  Hoarseness - resolved after stopping ICS --CONTINUE humidifier, saline spray and reflux meds as needed  Follow-up with me in 6 months

## 2021-08-20 NOTE — Progress Notes (Signed)
Subjective:   PATIENT ID: Felicia [name] GENDER: female DOB: [birth year], MRN: [id number]   HPI  Chief Complaint  Patient presents with   Follow-up    Pt states she has been doing okay since last visit. States she still has pain in her ribs. States that she has not lost her voice anymore since medication changed.   Reason for Visit: Follow-up  [name] is a 71 year old female never smoker with left breast cancer s/p radiation, CAD, DM and HTN who presents for follow-up  Initial Consult After completing one month after radiation, she reports having left sided chest pain/pressure with radiation to the the back associated with shortness of breath. She feels she cannot get a full breath and can be tight. Associated with dry cough. Denies chest congestion. Most nights a week she has discomfort related to this pain that will awaken. Denies bilateral chest pain. Talking will get her winded.  Denies any respiratory symptoms prior to radiation symptoms. Since treatment she has a difficult time to catching her breath.   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  12/27/20 She restarted on Advair daily for cough with occasional thick clear sputum and chest tightness. Denies wheezing. Does not feel like the inhaler is working. Denies fevers. Associated back or "lung" pain where she was radiated that seems right-sided but can be bilateral at times.   01/19/21 She is still having chest pain/tightness that is worse at night. Reports nasal congestion and coughing. It feels like "lung" pain that is bilateral. She has been more hoarse with sore throat that began prior to inhaler use. She loses her voice with talking. She reports in 2017 needing a Speech therapist after a  TIA.  01/31/21 Since our last visit, she reports she is still having persistent lung and chest pain after finishing antibiotics. Has been compliant with inhalers including Spiriva. Five days ago she is starting having more wheezing with the cold air. Uses her albuterol inhaler once a week.  03/20/21 Since our last visit, she was started on Breo in addition to [place]. She reports she is feeling better. This is her allergy season and taking meds for this. She reports her shortness of breath and chest pain has improved by 90%. She has been seen by ENT in Feb 2023 for her hoarseness. Exam demonstrated mucous otherwise normal. Advised started saline and humidifier which patient reports has improved.  07/09/21 Went to the ED on 6/15. She reports losing her voice due to hoarseness that waxes and wanes. Her voice will completely cut out. She has been compliant with her Spiriva and Breo.  She has bilateral chest pain/"lung pain" that seems to wrap underneath her breasts. Financial status is difficult and limits what medications she can get.  08/20/21 Since our last visit we held ICS component of her inhalers due to hoarseness. She does feel some occasional chest congestion 1-2 times a months. Improves with hot showers. Has not taken any mucinex. Denies shortness of breath, cough or wheezing. Continues to have bone pain L> R chest. Recent bone scan with increased uptake in the left humeral head.  Social History: Second hand smoke exposure Reports she was born two months early  Past Medical History:  Diagnosis Date  Anemia    Anxiety    Breast cancer ([place]) 09/2019   left breast IMC   CAD (coronary artery disease)    a. 3/2007s/p DES to the LAD [facility]); b. 01/2017 MV: EF 80%, small, mild apical ant defect w/o ischemia (felt to be breast atten). Low risk.   Depression    DM (diabetes mellitus) (HCC)    Essential hypertension    GERD (gastroesophageal reflux disease)    Headache    secondary to  neck surgery per patient   High triglycerides    Hyperlipidemia    Hypothyroid    OA (osteoarthritis)    right knee,hands   Overflow incontinence    PONV (postoperative nausea and vomiting)    s/p gallbladder    RLS (restless legs syndrome)    Urinary urgency    Uterine cancer ([place]) dx'd 2014     Family History  Problem Relation Age of Onset   AAA (abdominal aortic aneurysm) Mother    Diabetes Father    Alzheimer's disease Father    Throat cancer Sister    Breast cancer Cousin      Social History   Occupational History   Not on file  Tobacco Use   Smoking status: Never   Smokeless tobacco: Never  Vaping Use   Vaping Use: Never used  Substance and Sexual Activity   Alcohol use: Yes    Comment: OCCASIONALLY   Drug use: No   Sexual activity: Not Currently    Birth control/protection: Surgical    Comment: hyst    Allergies  Allergen Reactions   Chloraprep One Step [Chlorhexidine Gluconate] Rash   Estrogens Other (See Comments)    PATIENT HAS A HISTORY OF CANCER AND HAS BEEN TOLD TO NEVER TAKE ANYTHING CONTAINING ESTROGEN, AS IT MIGHT CAUSE A RECURRENCE   Shrimp [Shellfish Allergy] Nausea And Vomiting     Outpatient Medications Prior to Visit  Medication Sig Dispense Refill   ACCU-CHEK GUIDE test strip USE TO check blood glucose UP TO four times daily AS DIRECTED 100 strip 0   Accu-Chek Softclix Lancets lancets USE TO check blood glucose UP TO four times daily AS DIRECTED 100 each 0   anastrozole (ARIMIDEX) 1 MG tablet Take 1 tablet (1 mg total) by mouth daily. 90 tablet 4   Ascorbic Acid (VITAMIN C PO) Take 1 tablet by mouth daily.     aspirin EC 81 MG tablet Take 81 mg by mouth daily.     atorvastatin (LIPITOR) 40 MG tablet TAKE ONE TABLET BY MOUTH EVERYDAY AT BEDTIME 90 tablet 2   bisoprolol (ZEBETA) 5 MG tablet TAKE ONE TABLET BY MOUTH EVERYDAY AT BEDTIME 90 tablet 1   blood glucose meter kit and supplies Dispense based on patient and insurance preference. Use  up to four times daily as directed. (FOR ICD-10 E10.9, E11.9). 1 each 0   Calcium Citrate-Vitamin D (CALCIUM + D PO) Take 1 tablet by mouth daily with supper.     celecoxib (CELEBREX) 200 MG capsule Take 1 capsule (200 mg total) by mouth daily.     CINNAMON PO Take 1 capsule by mouth daily with supper.     diclofenac sodium (VOLTAREN) 1 % GEL Apply 2-4 g topically 4 (four) times daily as needed (as directed for pain).     empagliflozin (JARDIANCE) 25 MG TABS tablet Take 1 tablet (25 mg total) by mouth daily before breakfast. 90 tablet 1   escitalopram (LEXAPRO) 20 MG tablet Take 1 tablet (20 mg  total) by mouth daily. 90 tablet 1   fenofibrate (TRICOR) 145 MG tablet Take 1 tablet (145 mg total) by mouth daily. 90 tablet 0   Ferrous Sulfate (IRON PO) Take 1 tablet by mouth daily. alternates days:1 tablet one day and 2 tablets the next day     fluticasone furoate-vilanterol (BREO ELLIPTA) 100-25 MCG/ACT AEPB Inhale 1 puff into the lungs daily. 30 each 11   gabapentin (NEURONTIN) 300 MG capsule Take 1 capsule (300 mg total) by mouth in the morning and at bedtime. 60 capsule 2   GLIPIZIDE XL 10 MG 24 hr tablet Take 2 tablets (20 mg total) by mouth daily. 90 tablet 3   levothyroxine (SYNTHROID) 137 MCG tablet 1 TABLET IN THE MORNING ON AN EMPTY STOMACH ORALLY ONCE A DAY 90 90 tablet 1   loratadine (CLARITIN) 10 MG tablet Take 1 tablet (10 mg total) by mouth daily as needed for allergies. 30 tablet 11   metFORMIN (GLUCOPHAGE) 500 MG tablet Take 2 tablets (1,000 mg total) by mouth 2 (two) times daily with a meal. Patient takes 1 tablet in AM and 2 tablets in PM 270 tablet 1   metroNIDAZOLE (METROGEL) 1 % gel Apply 1 application topically as needed (as directed to affected area). 45 g 2   Multiple Vitamins-Calcium (ONE-A-DAY WOMENS PO) Take 1 tablet by mouth daily.     nitroGLYCERIN (NITROSTAT) 0.4 MG SL tablet Place 1 tablet (0.4 mg total) under the tongue every 5 (five) minutes as needed for chest pain. 25  tablet 6   omeprazole (PRILOSEC) 40 MG capsule Take 1 capsule (40 mg total) by mouth daily with supper. 90 capsule 1   prochlorperazine (COMPAZINE) 10 MG tablet TAKE 1 TABLET BY MOUTH BEFORE MEALS AND AT BEDTIME ON DAYS 2 AND 3 AFTER CHEMOTHERAPY (COUNTING CHEMOTHERAPY DAY AS DAY 1) AFTER THAT MAY TAKE AS NEEDED 30 tablet 1   rOPINIRole (REQUIP) 0.5 MG tablet TAKE ONE TABLET BY MOUTH EVERYDAY AT BEDTIME 90 tablet 0   tiZANidine (ZANAFLEX) 4 MG tablet Take 4 mg by mouth every 8 (eight) hours as needed for muscle spasms. As needed     traMADol (ULTRAM) 50 MG tablet Take 1 tablet (50 mg total) by mouth every 6 (six) hours as needed for moderate pain. 30 tablet 0   TRULICITY 1.5 GY/1.8HU SOPN Inject 1.5 mg into the skin once a week. 2 mL 2   TURMERIC PO Take 1 capsule by mouth daily with supper.     No facility-administered medications prior to visit.    Review of Systems  Constitutional:  Negative for chills, diaphoresis, fever, malaise/fatigue and weight loss.  HENT:  Negative for congestion.   Respiratory:  Negative for cough, hemoptysis, sputum production, shortness of breath and wheezing.   Cardiovascular:  Positive for chest pain. Negative for palpitations and leg swelling.     Objective:   Vitals:   08/20/21 1457  BP: 122/68  Pulse: 73  Temp: 98.2 F (36.8 C)  TempSrc: Oral  SpO2: 96%  Weight: 172 lb 12.8 oz (78.4 kg)  Height: 5' 4" (1.626 m)     SpO2: 96 % (RA) O2 Device: None (Room air)  Physical Exam: General: Well-appearing, no acute distress HENT: [place], AT Eyes: EOMI, no scleral icterus Respiratory: Clear to auscultation bilaterally.  No crackles, wheezing or rales Cardiovascular: RRR, -M/R/G, no JVD Extremities:-Edema,-tenderness Neuro: AAO x4, CNII-XII grossly intact Psych: Normal mood, normal affect  Data Reviewed:  Imaging: CTA 04/29/20 - No pulmonary emboli. Dependent  bibasilar atelectasis. Diffuse emphysema with mosaic attenuation CT Chest 01/11/21 -  Multifocal reticulonodular opacities CT CAP 08/09/21 - Visualized parenchymal resolution of opacities with minimal subpleural radiation fibrosis in LUL  PFT: 08/10/20 FVC 2.61 (87%) FEV1 2.39 (105%) Ratio 87  TLC 88% DLCO 117% Interpretation: Normal spirometry. Increased DLCO suggestive of asthma No significant bronchodilator response  Labs: CBC    Component Value Date/Time   WBC 11.1 (H) 08/02/2021 1036   WBC 11.0 (H) 06/27/2021 1957   RBC 4.34 08/02/2021 1036   HGB 13.8 08/02/2021 1036   HCT 41.9 08/02/2021 1036   PLT 216 08/02/2021 1036   MCV 96.5 08/02/2021 1036   MCH 31.8 08/02/2021 1036   MCHC 32.9 08/02/2021 1036   RDW 13.7 08/02/2021 1036   LYMPHSABS 7.6 (H) 08/02/2021 1036   MONOABS 0.5 08/02/2021 1036   EOSABS 0.1 08/02/2021 1036   BASOSABS 0.0 08/02/2021 1036   Echocardiogram 11/27/20 EF 60-65%. No valvular or WMA  Assessment & Plan:   Discussion: 71 year old female never smoker with hx CAD s/p stent to LAD, hx breast cancer s/p left sided radiation completed 04/2020 who presents for follow-up emphysema +/- occupational asthma +/- radiation pneumonitis. Resolved hoarseness off ICS. Overall COPD symptoms well-controlled. Some occasional chest congestion.  Emphysema --CONTINUE Stiolto 2.5/2.5 mcg TWO puffs ONCE a day. REFILL --CONTINUE Albuterol AS NEEDED for shortness of breath or wheezing.  --START mucinex as needed for chest congestion  Hoarseness - resolved after stopping ICS --CONTINUE humidifier, saline spray and reflux meds as needed   Health Maintenance Immunization History  Administered Date(s) Administered   Influenza, High Dose Seasonal PF 12/31/2016, 10/24/2017   Pneumococcal Conjugate-13 10/24/2017   Td 07/12/2005, 02/08/2019   CT Lung Screen - not indicated  No orders of the defined types were placed in this encounter.  No orders of the defined types were placed in this encounter.  No follow-ups on file.   I have spent a total time of  31-minutes on the day of the appointment including chart review, data review, collecting history, coordinating care and discussing medical diagnosis and plan with the patient/family. Past medical history, allergies, medications were reviewed. Pertinent imaging, labs and tests included in this note have been reviewed and interpreted independently by me.  Admire, MD [facility] 08/20/2021 3:00 PM  Office Number [phone]

## 2021-08-21 ENCOUNTER — Other Ambulatory Visit: Payer: Self-pay | Admitting: *Deleted

## 2021-08-21 NOTE — Progress Notes (Signed)
Received call from Felicia [name] stating she is scheduled with Dr. Lenna Gilford at [facility] this Thursday 08/23/21 and is requesting for left shoulder xray to be preformed in their office for further evaluation of nonspecific uptake on the left humeral head recently seen on bone scan.  RN placed call to [place], RN with Dr. Lenna Gilford [phone]) requesting xray be preformed in office.  Jenny Reichmann states she will review with MD and alert our office of decision. Felicia [name] educated on plan and verbalized understanding.

## 2021-08-22 ENCOUNTER — Other Ambulatory Visit: Payer: Medicare Other

## 2021-08-22 DIAGNOSIS — Z79899 Other long term (current) drug therapy: Secondary | ICD-10-CM

## 2021-08-22 DIAGNOSIS — E785 Hyperlipidemia, unspecified: Secondary | ICD-10-CM

## 2021-08-22 LAB — LIPID PANEL
Chol/HDL Ratio: 3.6 ratio (ref 0.0–4.4)
Cholesterol, Total: 185 mg/dL (ref 100–199)
HDL: 52 mg/dL (ref >39)
LDL Chol Calc (NIH): 77 mg/dL (ref 0–99)
Triglycerides: 350 mg/dL — ABNORMAL HIGH (ref 0–149)
VLDL Cholesterol Cal: 56 mg/dL — ABNORMAL HIGH (ref 5–40)

## 2021-08-22 LAB — HM MAMMOGRAPHY

## 2021-08-22 LAB — ALT: ALT: 42 IU/L — ABNORMAL HIGH (ref 0–32)

## 2021-08-29 ENCOUNTER — Encounter: Payer: Self-pay | Admitting: Physician Assistant

## 2021-09-25 ENCOUNTER — Ambulatory Visit (INDEPENDENT_AMBULATORY_CARE_PROVIDER_SITE_OTHER): Payer: Medicare Other | Admitting: Physician Assistant

## 2021-09-25 ENCOUNTER — Encounter: Payer: Self-pay | Admitting: Physician Assistant

## 2021-09-25 VITALS — BP 128/76 | HR 75 | Temp 97.6°F | Ht 64.0 in | Wt 172.6 lb

## 2021-09-25 DIAGNOSIS — F419 Anxiety disorder, unspecified: Secondary | ICD-10-CM

## 2021-09-25 DIAGNOSIS — M154 Erosive (osteo)arthritis: Secondary | ICD-10-CM | POA: Diagnosis not present

## 2021-09-25 DIAGNOSIS — E119 Type 2 diabetes mellitus without complications: Secondary | ICD-10-CM

## 2021-09-25 DIAGNOSIS — F32A Depression, unspecified: Secondary | ICD-10-CM

## 2021-09-25 DIAGNOSIS — Z23 Encounter for immunization: Secondary | ICD-10-CM | POA: Diagnosis not present

## 2021-09-25 LAB — POCT GLYCOSYLATED HEMOGLOBIN (HGB A1C): Hemoglobin A1C: 7.8 % — AB (ref 4.0–5.6)

## 2021-09-25 MED ORDER — TRULICITY 3 MG/0.5ML ~~LOC~~ SOAJ: 3.0000 mg | SUBCUTANEOUS | 2 refills | Status: DC

## 2021-09-25 NOTE — Patient Instructions (Signed)
Increase Trulicity to 3 mg once weekly   Continue regular follow-ups with specialists  Let me know about if you decide to try to get an animal.  I do think it would be beneficial for your depression, but also something to weigh out with your finances.

## 2021-09-25 NOTE — Progress Notes (Unsigned)
Subjective:    Patient ID: Felicia [name], female    DOB: [birth year], 71 y.o.   MRN: [id number]  Chief Complaint  Patient presents with   Follow-up    Pt in for follow up on blood sugars; pt doing well just in a lot of pain, legs have felt weak, spasms in both leg, still having headaches; also having trouble turning her neck but seeing Neurologist in October. Pt says morning blood sugars are close to 200+ and not able to get down to normal reading(130+)  till evenings.     HPI Patient is in today for follow-up.  This morning's fasting glucose was 245. Usually mornings are in the 200s; by evenings usually get into the 130s. Making some substitutions to her nutrition habits. Food insecurity present. Neighbor has been helping her with meals and daughter has gone shopping with her also.  Laying down at night has most pain in ribs, spine, neck. Rheum showed a lot of arthritis on x-rays. Currently on Celebrex BID, Gabapentin BID - this is helping some of her pain levels.   Depression is still present. Worsening because of pain. Wondering if she would benefit from ESA at her home. Denies SI like she was experiencing.   Past Medical History:  Diagnosis Date   Anemia    Anxiety    Breast cancer ([place]) 09/2019   left breast IMC   CAD (coronary artery disease)    a. 3/2007s/p DES to the LAD [facility]); b. 01/2017 MV: EF 80%, small, mild apical ant defect w/o ischemia (felt to be breast atten). Low risk.   Depression    DM (diabetes mellitus) ([place])    Essential hypertension    GERD (gastroesophageal reflux disease)    Headache    secondary to neck surgery per patient   High triglycerides    Hyperlipidemia    Hypothyroid    OA (osteoarthritis)    right knee,hands   Overflow incontinence    PONV (postoperative nausea and vomiting)    s/p gallbladder    RLS (restless legs syndrome)    Urinary urgency    Uterine cancer ([place]) dx'd 2014    Past Surgical History:  Procedure  Laterality Date   BREAST LUMPECTOMY WITH RADIOACTIVE SEED AND SENTINEL LYMPH NODE BIOPSY Left 10/07/2019   Procedure: LEFT BREAST LUMPECTOMY WITH RADIOACTIVE SEED AND SENTINEL LYMPH NODE BIOPSY;  Surgeon: Rolm Bookbinder, MD;  Location: [facility];  Service: General;  Laterality: Left;   CORONARY STENT PLACEMENT     CORONARY/GRAFT ACUTE MI REVASCULARIZATION     FINGER ARTHRODESIS Right 06/11/2019   Procedure: ARTHRODESIS INDEX FINGER DISTAL PHALANGEAL JOINT;  Surgeon: [name], MD;  Location: [place];  Service: Orthopedics;  Laterality: Right;   FOOT SURGERY Left    LEFT HEART CATH AND CORONARY ANGIOGRAPHY N/A 02/16/2018   Procedure: LEFT HEART CATH AND CORONARY ANGIOGRAPHY;  Surgeon: Lorretta Harp, MD;  Location: [facility];  Service: Cardiovascular;  Laterality: N/A;   LEFT HEART CATH AND CORONARY ANGIOGRAPHY N/A 05/01/2020   Procedure: LEFT HEART CATH AND CORONARY ANGIOGRAPHY;  Surgeon: Lorretta Harp, MD;  Location: [facility];  Service: Cardiovascular;  Laterality: N/A;   PORTACATH PLACEMENT Right 11/23/2019   Procedure: INSERTION PORT-A-CATH WITH ULTRASOUND GUIDANCE;  Surgeon: Rolm Bookbinder, MD;  Location: [place] [facility];  Service: General;  Laterality: Right;   REPLACEMENT TOTAL KNEE Right    SPINE SURGERY     TOTAL ABDOMINAL HYSTERECTOMY  FOR UTERUS CANCER   TOTAL HIP ARTHROPLASTY Left 2015    Family History  Problem Relation Age of Onset   AAA (abdominal aortic aneurysm) Mother    Diabetes Father    Alzheimer's disease Father    Throat cancer Sister    Breast cancer Cousin     Social History   Tobacco Use   Smoking status: Never   Smokeless tobacco: Never  Vaping Use   Vaping Use: Never used  Substance Use Topics   Alcohol use: Yes    Comment: OCCASIONALLY   Drug use: No     Allergies  Allergen Reactions   Chloraprep One Step [Chlorhexidine Gluconate] Rash   Estrogens Other (See  Comments)    PATIENT HAS A HISTORY OF CANCER AND HAS BEEN TOLD TO NEVER TAKE ANYTHING CONTAINING ESTROGEN, AS IT MIGHT CAUSE A RECURRENCE   Shrimp [Shellfish Allergy] Nausea And Vomiting    Review of Systems NEGATIVE UNLESS OTHERWISE INDICATED IN HPI      Objective:     BP 128/76 (BP Location: Right Arm)   Pulse 75   Temp 97.6 F (36.4 C) (Temporal)   Ht '5\' 4"'$  (1.626 m)   Wt 172 lb 9.6 oz (78.3 kg)   SpO2 98%   BMI 29.63 kg/m   Wt Readings from Last 3 Encounters:  09/25/21 172 lb 9.6 oz (78.3 kg)  08/20/21 172 lb 12.8 oz (78.4 kg)  08/02/21 170 lb 11.2 oz (77.4 kg)    BP Readings from Last 3 Encounters:  09/25/21 128/76  08/20/21 122/68  08/02/21 119/69     Physical Exam Constitutional:      General: She is not in acute distress.    Appearance: Normal appearance. She is not toxic-appearing.  Cardiovascular:     Rate and Rhythm: Normal rate and regular rhythm.     Pulses: Normal pulses.     Heart sounds: Normal heart sounds.  Pulmonary:     Effort: Pulmonary effort is normal.     Breath sounds: Normal breath sounds.  Skin:    Findings: No rash.  Neurological:     General: No focal deficit present.     Mental Status: She is alert.     Cranial Nerves: No cranial nerve deficit.     Motor: No weakness.     Gait: Gait normal.  Psychiatric:        Mood and Affect: Mood normal.        Behavior: Behavior normal.        Assessment & Plan:  Type 2 diabetes mellitus without complication, without long-term current use of insulin (HCC) Assessment & Plan: Lab Results  Component Value Date   HGBA1C 7.8 (A) 09/25/2021   I encouraged the patient that her hemoglobin A1c has greatly improved.  Her fasting sugars are still trending high.  I am going to keep her on metformin 1000 mg twice daily as she is tolerating this well.  She will also continue on glipizide XL 20 mg daily.  We can increase the Trulicity to 3 mg once weekly.  She is going to keep working on  nutrition.  Orders: -     POCT glycosylated hemoglobin (Hb A1C)  Anxiety and depression Assessment & Plan: Waxes and wanes, but not having any suicidal thoughts.  Doing better with Lexapro 20 mg and she will continue on this.  She is also enjoying crafting cards and trying to get outside.   Erosive osteoarthritis of multiple sites Assessment & Plan: She  has a lot of pain which contributes to her anxiety and depression.  She is working with Dr. Trudie Reed rheumatology on treatment options.   Need for prophylactic vaccination against Streptococcus pneumoniae (pneumococcus) -     Pneumococcal conjugate vaccine 20-valent  Need for immunization against influenza -     Flu Vaccine QUAD High Dose(Fluad)  Other orders -     Trulicity; Inject 3 mg as directed once a week.  Dispense: 0.5 mL; Refill: 2      Return in about 3 months (around 12/25/2021) for fasting labs, recheck.  This note was prepared with assistance of Systems analyst. Occasional wrong-word or sound-a-like substitutions may have occurred due to the inherent limitations of voice recognition software.     Kaytlynne Neace M Ameera Tigue, PA-C

## 2021-09-26 ENCOUNTER — Encounter: Payer: Self-pay | Admitting: Physician Assistant

## 2021-09-26 DIAGNOSIS — M154 Erosive (osteo)arthritis: Secondary | ICD-10-CM | POA: Insufficient documentation

## 2021-09-26 NOTE — Assessment & Plan Note (Addendum)
Lab Results  Component Value Date   HGBA1C 7.8 (A) 09/25/2021   I encouraged the patient that her hemoglobin A1c has greatly improved.  Her fasting sugars are still trending high.  I am going to keep her on metformin 1000 mg twice daily as she is tolerating this well.  She will also continue on glipizide XL 20 mg daily.  We can increase the Trulicity to 3 mg once weekly.  She is going to keep working on nutrition.

## 2021-09-26 NOTE — Assessment & Plan Note (Signed)
Waxes and wanes, but not having any suicidal thoughts.  Doing better with Lexapro 20 mg and she will continue on this.  She is also enjoying crafting cards and trying to get outside.

## 2021-09-26 NOTE — Assessment & Plan Note (Signed)
She has a lot of pain which contributes to her anxiety and depression.  She is working with Dr. Trudie Reed rheumatology on treatment options.

## 2021-10-17 ENCOUNTER — Telehealth: Payer: Self-pay | Admitting: Pharmacist

## 2021-10-17 NOTE — Progress Notes (Signed)
Chronic Care Management Pharmacy Assistant   Name: Felicia [name]  MRN: [id number] DOB: September 04, 1950   Reason for Encounter: Medication Coordination Call    Medications: Outpatient Encounter Medications as of 10/17/2021  Medication Sig Note   ACCU-CHEK GUIDE test strip USE TO check blood glucose UP TO four times daily AS DIRECTED    Accu-Chek Softclix Lancets lancets USE TO check blood glucose UP TO four times daily AS DIRECTED    anastrozole (ARIMIDEX) 1 MG tablet Take 1 tablet (1 mg total) by mouth daily.    Ascorbic Acid (VITAMIN C PO) Take 1 tablet by mouth daily.    aspirin EC 81 MG tablet Take 81 mg by mouth daily.    atorvastatin (LIPITOR) 40 MG tablet TAKE ONE TABLET BY MOUTH EVERYDAY AT BEDTIME    bisoprolol (ZEBETA) 5 MG tablet TAKE ONE TABLET BY MOUTH EVERYDAY AT BEDTIME    blood glucose meter kit and supplies Dispense based on patient and insurance preference. Use up to four times daily as directed. (FOR ICD-10 E10.9, E11.9).    Calcium Citrate-Vitamin D (CALCIUM + D PO) Take 1 tablet by mouth daily with supper.    celecoxib (CELEBREX) 200 MG capsule Take 1 capsule (200 mg total) by mouth daily. (Patient taking differently: Take 200 mg by mouth 2 (two) times daily.)    CINNAMON PO Take 1 capsule by mouth daily with supper.    diclofenac sodium (VOLTAREN) 1 % GEL Apply 2-4 g topically 4 (four) times daily as needed (as directed for pain).    Dulaglutide (TRULICITY) 3 PX/1.0GY SOPN Inject 3 mg as directed once a week.    empagliflozin (JARDIANCE) 25 MG TABS tablet Take 25 mg by mouth daily.    escitalopram (LEXAPRO) 20 MG tablet Take 1 tablet (20 mg total) by mouth daily. 05/31/2021: Needs refill.   fenofibrate (TRICOR) 145 MG tablet Take 1 tablet (145 mg total) by mouth daily.    Ferrous Sulfate (IRON PO) Take 1 tablet by mouth daily. alternates days:1 tablet one day and 2 tablets the next day    gabapentin (NEURONTIN) 300 MG capsule Take 1 capsule (300 mg total) by mouth in  the morning and at bedtime.    GLIPIZIDE XL 10 MG 24 hr tablet Take 2 tablets (20 mg total) by mouth daily.    levothyroxine (SYNTHROID) 137 MCG tablet 1 TABLET IN THE MORNING ON AN EMPTY STOMACH ORALLY ONCE A DAY 90    loratadine (CLARITIN) 10 MG tablet Take 1 tablet (10 mg total) by mouth daily as needed for allergies.    metFORMIN (GLUCOPHAGE) 500 MG tablet Take 2 tablets (1,000 mg total) by mouth 2 (two) times daily with a meal. Patient takes 1 tablet in AM and 2 tablets in PM    metroNIDAZOLE (METROGEL) 1 % gel Apply 1 application topically as needed (as directed to affected area).    Multiple Vitamins-Calcium (ONE-A-DAY WOMENS PO) Take 1 tablet by mouth daily.    nitroGLYCERIN (NITROSTAT) 0.4 MG SL tablet Place 1 tablet (0.4 mg total) under the tongue every 5 (five) minutes as needed for chest pain.    omeprazole (PRILOSEC) 40 MG capsule Take 1 capsule (40 mg total) by mouth daily with supper.    prochlorperazine (COMPAZINE) 10 MG tablet TAKE 1 TABLET BY MOUTH BEFORE MEALS AND AT BEDTIME ON DAYS 2 AND 3 AFTER CHEMOTHERAPY (COUNTING CHEMOTHERAPY DAY AS DAY 1) AFTER THAT MAY TAKE AS NEEDED    rOPINIRole (REQUIP) 0.5 MG tablet TAKE ONE  TABLET BY MOUTH EVERYDAY AT BEDTIME    Tiotropium Bromide-Olodaterol (STIOLTO RESPIMAT) 2.5-2.5 MCG/ACT AERS Inhale 2 puffs into the lungs daily.    tiZANidine (ZANAFLEX) 4 MG tablet Take 4 mg by mouth every 8 (eight) hours as needed for muscle spasms. As needed    traMADol (ULTRAM) 50 MG tablet Take 1 tablet (50 mg total) by mouth every 6 (six) hours as needed for moderate pain.    TURMERIC PO Take 1 capsule by mouth daily with supper.    No facility-administered encounter medications on file as of 10/17/2021.    Coordinated acute fill for Trulicity to be delivered 10/19/2021  Patient also has a paper Rx for Amoxicillin that she has received from her dentist. She is aware [facility] will pick up prescription on the day of delivery to return to her on  Monday 10/22/2021  Confirmed delivery date of 10/19/2021 and 10/22/2021, advised patient that pharmacy will contact them the morning of delivery.  April D Calhoun, [place] Pharmacist Assistant [phone]

## 2021-10-19 ENCOUNTER — Other Ambulatory Visit: Payer: Self-pay | Admitting: Cardiovascular Disease

## 2021-10-19 MED ORDER — FENOFIBRATE 145 MG PO TABS: 145.0000 mg | ORAL_TABLET | Freq: Every day | ORAL | 1 refills | Status: DC

## 2021-10-24 ENCOUNTER — Telehealth: Payer: Self-pay | Admitting: Pharmacist

## 2021-10-24 NOTE — Progress Notes (Signed)
Chronic Care Management Pharmacy Assistant   Name: Felicia [name]  MRN: [id number] DOB: [birth year]  Reason for Encounter: Medication Coordination Call    Recent office visits:  None  Recent consult visits:  None  Hospital visits:  None in previous 6 months  Medications: Outpatient Encounter Medications as of 10/24/2021  Medication Sig Note   ACCU-CHEK GUIDE test strip USE TO check blood glucose UP TO four times daily AS DIRECTED    Accu-Chek Softclix Lancets lancets USE TO check blood glucose UP TO four times daily AS DIRECTED    anastrozole (ARIMIDEX) 1 MG tablet Take 1 tablet (1 mg total) by mouth daily.    Ascorbic Acid (VITAMIN C PO) Take 1 tablet by mouth daily.    aspirin EC 81 MG tablet Take 81 mg by mouth daily.    atorvastatin (LIPITOR) 40 MG tablet TAKE ONE TABLET BY MOUTH EVERYDAY AT BEDTIME    bisoprolol (ZEBETA) 5 MG tablet TAKE ONE TABLET BY MOUTH EVERYDAY AT BEDTIME    blood glucose meter kit and supplies Dispense based on patient and insurance preference. Use up to four times daily as directed. (FOR ICD-10 E10.9, E11.9).    Calcium Citrate-Vitamin D (CALCIUM + D PO) Take 1 tablet by mouth daily with supper.    celecoxib (CELEBREX) 200 MG capsule Take 1 capsule (200 mg total) by mouth daily. (Patient taking differently: Take 200 mg by mouth 2 (two) times daily.)    CINNAMON PO Take 1 capsule by mouth daily with supper.    diclofenac sodium (VOLTAREN) 1 % GEL Apply 2-4 g topically 4 (four) times daily as needed (as directed for pain).    Dulaglutide (TRULICITY) 3 GN/0.0BB SOPN Inject 3 mg as directed once a week.    empagliflozin (JARDIANCE) 25 MG TABS tablet Take 25 mg by mouth daily.    escitalopram (LEXAPRO) 20 MG tablet Take 1 tablet (20 mg total) by mouth daily. 05/31/2021: Needs refill.   fenofibrate (TRICOR) 145 MG tablet Take 1 tablet (145 mg total) by mouth daily.    Ferrous Sulfate (IRON PO) Take 1 tablet by mouth daily. alternates days:1 tablet one day  and 2 tablets the next day    gabapentin (NEURONTIN) 300 MG capsule Take 1 capsule (300 mg total) by mouth in the morning and at bedtime.    GLIPIZIDE XL 10 MG 24 hr tablet Take 2 tablets (20 mg total) by mouth daily.    levothyroxine (SYNTHROID) 137 MCG tablet 1 TABLET IN THE MORNING ON AN EMPTY STOMACH ORALLY ONCE A DAY 90    loratadine (CLARITIN) 10 MG tablet Take 1 tablet (10 mg total) by mouth daily as needed for allergies.    metFORMIN (GLUCOPHAGE) 500 MG tablet Take 2 tablets (1,000 mg total) by mouth 2 (two) times daily with a meal. Patient takes 1 tablet in AM and 2 tablets in PM    metroNIDAZOLE (METROGEL) 1 % gel Apply 1 application topically as needed (as directed to affected area).    Multiple Vitamins-Calcium (ONE-A-DAY WOMENS PO) Take 1 tablet by mouth daily.    nitroGLYCERIN (NITROSTAT) 0.4 MG SL tablet Place 1 tablet (0.4 mg total) under the tongue every 5 (five) minutes as needed for chest pain.    omeprazole (PRILOSEC) 40 MG capsule Take 1 capsule (40 mg total) by mouth daily with supper.    prochlorperazine (COMPAZINE) 10 MG tablet TAKE 1 TABLET BY MOUTH BEFORE MEALS AND AT BEDTIME ON DAYS 2 AND 3 AFTER CHEMOTHERAPY (COUNTING CHEMOTHERAPY  DAY AS DAY 1) AFTER THAT MAY TAKE AS NEEDED    rOPINIRole (REQUIP) 0.5 MG tablet TAKE ONE TABLET BY MOUTH EVERYDAY AT BEDTIME    Tiotropium Bromide-Olodaterol (STIOLTO RESPIMAT) 2.5-2.5 MCG/ACT AERS Inhale 2 puffs into the lungs daily.    tiZANidine (ZANAFLEX) 4 MG tablet Take 4 mg by mouth every 8 (eight) hours as needed for muscle spasms. As needed    traMADol (ULTRAM) 50 MG tablet Take 1 tablet (50 mg total) by mouth every 6 (six) hours as needed for moderate pain.    TURMERIC PO Take 1 capsule by mouth daily with supper.    No facility-administered encounter medications on file as of 10/24/2021.   Reviewed chart for medication changes ahead of medication coordination call.  No OVs, Consults, or hospital visits since last care  coordination call/Pharmacist visit. (If appropriate, list visit date, provider name)  No medication changes indicated OR if recent visit, treatment plan here.  BP Readings from Last 3 Encounters:  09/25/21 128/76  08/20/21 122/68  08/02/21 119/69    Lab Results  Component Value Date   HGBA1C 7.8 (A) 09/25/2021     Patient obtains medications through Adherence Packaging  90 Days   Last adherence delivery included: Fenofibrate 145 mg at Bedtime Ropinirole 0.5 mg at Bedtime Bisoprolol 5 mg at Bedtime Omeprazole 40 mg at Evening Meal Escitalopram 20 mg at Bedtime Levothyroxine 137 mcg Before Breakfast Metformin 500 mg two at Breakfast and two at Evening meal Glipizide 10 mg two at Breakfast Loratadine 10 mg at Evening meal Anastrole 1 mg at Bedtime Atorvastatin 40 mg at Bedtime Gabapentin 300 mg one at Breakfast and one at Evening meal Jardiance 25 mg Before Breakfast Aspirin 81 mg at Breakfast  Patient is due for next adherence delivery on: 11/01/2021. Called patient and reviewed medications and coordinated delivery.  This delivery to include: Fenofibrate 145 mg at Bedtime Ropinirole 0.5 mg at Bedtime Bisoprolol 5 mg at Bedtime Omeprazole 40 mg at Evening Meal Escitalopram 20 mg at Bedtime Levothyroxine 137 mcg Before Breakfast Metformin 500 mg two at Breakfast and two at Evening meal Glipizide 10 mg two at Breakfast Loratadine 10 mg at Evening meal Anastrole 1 mg at Bedtime Atorvastatin 40 mg at Bedtime Gabapentin 300 mg one at Breakfast and one at Evening meal Jardiance 25 mg Before Breakfast Aspirin 81 mg at Breakfast Celebrex 200 mg one a breakfast, one at bedtime  Patient needs refills for: Jardiance 25 mg Before Breakfast Levothyroxine 137 mcg Before Breakfast Metformin 500 mg two at Breakfast and two at Evening meal Glipizide 10 mg two at Breakfast Escitalopram 20 mg at Bedtime Ropinirole 0.5 mg at Bedtime -Rx refill request sent to PCP.  Confirmed  delivery date of 11/01/2021, advised patient that pharmacy will contact them the morning of delivery.   Care Gaps: Medicare Annual Wellness: Completed 04/06/2021 Ophthalmology Exam: Overdue - never done Foot Exam: Overdue - never done Hemoglobin A1C: 9.5% on 05/21/2021 Colonoscopy: Next due on 10/06/2023 Dexa Scan: Completed Mammogram: Ordered on 11/16/2020  Future Appointments  Date Time Provider [place]  10/31/2021  2:50 PM Pieter Partridge, DO [facility] None  12/24/2021  1:00 PM Allwardt, Randa Evens, PA-C [facility]  01/21/2022  3:00 PM Suanne Marker, PTA [facility] None  04/19/2022  1:30 PM [facility] HEALTH COACH [facility]  08/05/2022  2:30 PM Nicholas Lose, MD [facility] None   April D Calhoun, [place] Pharmacist Assistant [phone]

## 2021-10-25 ENCOUNTER — Other Ambulatory Visit: Payer: Self-pay

## 2021-10-25 DIAGNOSIS — R079 Chest pain, unspecified: Secondary | ICD-10-CM

## 2021-10-25 MED ORDER — EMPAGLIFLOZIN 25 MG PO TABS: 25.0000 mg | ORAL_TABLET | Freq: Every day | ORAL | 1 refills | Status: DC

## 2021-10-25 MED ORDER — GLIPIZIDE XL 10 MG PO TB24: 20.0000 mg | ORAL_TABLET | Freq: Every day | ORAL | 1 refills | Status: DC

## 2021-10-25 MED ORDER — METFORMIN HCL 500 MG PO TABS: 1000.0000 mg | ORAL_TABLET | Freq: Two times a day (BID) | ORAL | 1 refills | Status: DC

## 2021-10-25 MED ORDER — ESCITALOPRAM OXALATE 20 MG PO TABS: 20.0000 mg | ORAL_TABLET | Freq: Every day | ORAL | 1 refills | Status: DC

## 2021-10-25 MED ORDER — ROPINIROLE HCL 0.5 MG PO TABS: ORAL_TABLET | ORAL | 1 refills | Status: DC

## 2021-10-25 MED ORDER — LEVOTHYROXINE SODIUM 137 MCG PO TABS: ORAL_TABLET | ORAL | 1 refills | Status: DC

## 2021-10-30 NOTE — Progress Notes (Unsigned)
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 Limit use of pain relievers to no more than 2 days out of week to prevent risk of rebound or medication-overuse headache. Follow up in 4-5 months.      Subjective:  BTarisa [name] a 71year old female with CAD, HTN, HLD, DM II, hypothyroidism, osteoarthritis, depression, anxiety and history of breast cancer and uterine cancer who presents for headaches.  History supplemented by referring provider's note.  She has had occasional sharp right sided paroxysmal headaches since childhood.  In 2017, she started developing occipital headaches with neck pain, left arm pain and balance problems.  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  Continues to have chronic neck pain and headache.  Neck pain radiates down into both shoulders.  Headache is often diffuse and throbbing but still has occasional sharp right sided headache.  Also may have left eye pain that radiates to top of head.  Sometimes phonophobia but usually not.  No nausea, vomiting, photophobia or visual disturbance.  Always with a persistent dull headache but has exacerbations everyday lasting a few hours.  Takes Tylenol or another "headache medication" daily.    MRI of brain with and without contrast on 02/14/2021 personally reviewed showed mild chronic small vessel ischemic changes within the cerebral white matter but overall unremarkable.    Past NSAIDS/analgesics:  tramadol Past abortive triptans:  none Past abortive ergotamine:   none Past muscle relaxants:  tizanidine Past anti-emetic:  Zofran 87mPast antihypertensive medications:  none Past antidepressant medications:  amitriptyline (helpful) Past anticonvulsant medications:  none Past anti-CGRP:  none Past vitamins/Herbal/Supplements:  none Past antihistamines/decongestants:  none Other past therapies:  none  Current NSAIDS/analgesics:  ASA 8166maily, celecoxib Current triptans:  none Current ergotamine:  none Current anti-emetic:  Compazine Current muscle relaxants:  none Current Antihypertensive medications:  none Current Antidepressant medications:  escitalopram 4m30mrrent Anticonvulsant medications:  gabapentin 300mg88m Current anti-CGRP:  none Current Antihistamines/Decongestants:  Claritin Other therapy:  none Other medications:  ropinirole 0.5mg Q73m  Caffeine:  no coffee.  Diet cola.  Decaf tea Family history of headache:  unknown.        PAST MEDICAL HISTORY: Past Medical History:  Diagnosis Date   Anemia    Anxiety    Breast cancer (HCC) [id number]   left breast IMC   CAD (coronary artery disease)    a. 3/2007[facility] LAD [facility] MV: EF 80%, small, mild apical ant defect w/o ischemia (felt to be breast atten). Low risk.   Depression    DM (diabetes mellitus) (HCC)  Tuttlessential hypertension    GERD (gastroesophageal reflux disease)    Headache    secondary to neck surgery per patient   High triglycerides    Hyperlipidemia    Hypothyroid    OA (osteoarthritis)    right knee,hands   Overflow incontinence    PONV (postoperative nausea and vomiting)    s/p gallbladder    RLS (restless legs syndrome)    Urinary urgency    Uterine cancer (HCC)[place]  dx'd 2014    PAST SURGICAL HISTORY: Past Surgical History:  Procedure Laterality Date   BREAST LUMPECTOMY WITH RADIOACTIVE SEED AND SENTINEL LYMPH NODE BIOPSY Left 10/07/2019   Procedure: LEFT BREAST LUMPECTOMY WITH RADIOACTIVE SEED AND SENTINEL LYMPH NODE  BIOPSY;  Surgeon: Rolm Bookbinder, MD;  Location: [place];  Service: General;  Laterality: Left;   CORONARY STENT PLACEMENT     CORONARY/GRAFT ACUTE MI REVASCULARIZATION     FINGER ARTHRODESIS Right 06/11/2019   Procedure: ARTHRODESIS INDEX FINGER DISTAL PHALANGEAL JOINT;  Surgeon: [name], MD;  Location: [place];  Service: Orthopedics;  Laterality: Right;   FOOT SURGERY Left    LEFT HEART CATH AND CORONARY ANGIOGRAPHY N/A 02/16/2018   Procedure: LEFT HEART CATH AND CORONARY ANGIOGRAPHY;  Surgeon: Lorretta Harp, MD;  Location: [place] [facility];  Service: Cardiovascular;  Laterality: N/A;   LEFT HEART CATH AND CORONARY ANGIOGRAPHY N/A 05/01/2020   Procedure: LEFT HEART CATH AND CORONARY ANGIOGRAPHY;  Surgeon: Lorretta Harp, MD;  Location: [place] [facility];  Service: Cardiovascular;  Laterality: N/A;   PORTACATH PLACEMENT Right 11/23/2019   Procedure: INSERTION PORT-A-CATH WITH ULTRASOUND GUIDANCE;  Surgeon: Rolm Bookbinder, MD;  Location: [place];  Service: General;  Laterality: Right;   REPLACEMENT TOTAL KNEE Right    SPINE SURGERY     TOTAL ABDOMINAL HYSTERECTOMY     FOR UTERUS CANCER   TOTAL HIP ARTHROPLASTY Left 2015    MEDICATIONS: Current Outpatient Medications on File Prior to Visit  Medication Sig Dispense Refill   ACCU-CHEK GUIDE test strip USE TO check blood glucose UP TO four times daily AS DIRECTED 100 strip 0   Accu-Chek Softclix Lancets lancets USE TO check blood glucose UP TO four times daily AS DIRECTED 100 each 0   anastrozole (ARIMIDEX) 1 MG tablet Take 1 tablet (1 mg total) by mouth daily. 90 tablet 4   Ascorbic Acid (VITAMIN C PO) Take 1 tablet by mouth daily.     aspirin EC 81 MG tablet Take 81 mg by mouth daily.     atorvastatin (LIPITOR) 40 MG tablet TAKE ONE TABLET BY MOUTH EVERYDAY AT BEDTIME 90 tablet 2   bisoprolol (ZEBETA) 5 MG tablet TAKE ONE TABLET BY MOUTH EVERYDAY AT BEDTIME 90  tablet 1   blood glucose meter kit and supplies Dispense based on patient and insurance preference. Use up to four times daily as directed. (FOR ICD-10 E10.9, E11.9). 1 each 0   Calcium Citrate-Vitamin D (CALCIUM + D PO) Take 1 tablet by mouth daily with supper.     celecoxib (CELEBREX) 200 MG capsule Take 1 capsule (200 mg total) by mouth daily. (Patient taking differently: Take 200 mg by mouth 2 (two) times daily.)     CINNAMON PO Take 1 capsule by mouth daily with supper.     diclofenac sodium (VOLTAREN) 1 % GEL Apply 2-4 g topically 4 (four) times daily as needed (as directed for pain).     Dulaglutide (TRULICITY) 3 ZH/2.9JM SOPN Inject 3 mg as directed once a week. 0.5 mL 2   empagliflozin (JARDIANCE) 25 MG TABS tablet Take 1 tablet (25 mg total) by mouth daily. 90 tablet 1   escitalopram (LEXAPRO) 20 MG tablet Take 1 tablet (20 mg total) by mouth daily. 90 tablet 1   fenofibrate (TRICOR) 145 MG tablet Take 1 tablet (145 mg total) by mouth daily. 90 tablet 1   Ferrous Sulfate (IRON PO) Take 1 tablet by mouth  daily. alternates days:1 tablet one day and 2 tablets the next day     gabapentin (NEURONTIN) 300 MG capsule Take 1 capsule (300 mg total) by mouth in the morning and at bedtime. 60 capsule 2   GLIPIZIDE XL 10 MG 24 hr tablet Take 2 tablets (20 mg total) by mouth daily. 90 tablet 1   levothyroxine (SYNTHROID) 137 MCG tablet 1 TABLET IN THE MORNING ON AN EMPTY STOMACH ORALLY ONCE A DAY 90 90 tablet 1   loratadine (CLARITIN) 10 MG tablet Take 1 tablet (10 mg total) by mouth daily as needed for allergies. 30 tablet 11   metFORMIN (GLUCOPHAGE) 500 MG tablet Take 2 tablets (1,000 mg total) by mouth 2 (two) times daily with a meal. Patient takes 1 tablet in AM and 2 tablets in PM 270 tablet 1   metroNIDAZOLE (METROGEL) 1 % gel Apply 1 application topically as needed (as directed to affected area). 45 g 2   Multiple Vitamins-Calcium (ONE-A-DAY WOMENS PO) Take 1 tablet by mouth daily.      nitroGLYCERIN (NITROSTAT) 0.4 MG SL tablet Place 1 tablet (0.4 mg total) under the tongue every 5 (five) minutes as needed for chest pain. 25 tablet 6   omeprazole (PRILOSEC) 40 MG capsule Take 1 capsule (40 mg total) by mouth daily with supper. 90 capsule 1   prochlorperazine (COMPAZINE) 10 MG tablet TAKE 1 TABLET BY MOUTH BEFORE MEALS AND AT BEDTIME ON DAYS 2 AND 3 AFTER CHEMOTHERAPY (COUNTING CHEMOTHERAPY DAY AS DAY 1) AFTER THAT MAY TAKE AS NEEDED 30 tablet 1   rOPINIRole (REQUIP) 0.5 MG tablet TAKE ONE TABLET BY MOUTH EVERYDAY AT BEDTIME 90 tablet 1   Tiotropium Bromide-Olodaterol (STIOLTO RESPIMAT) 2.5-2.5 MCG/ACT AERS Inhale 2 puffs into the lungs daily. 4 g 5   tiZANidine (ZANAFLEX) 4 MG tablet Take 4 mg by mouth every 8 (eight) hours as needed for muscle spasms. As needed     traMADol (ULTRAM) 50 MG tablet Take 1 tablet (50 mg total) by mouth every 6 (six) hours as needed for moderate pain. 30 tablet 0   TURMERIC PO Take 1 capsule by mouth daily with supper.     No current facility-administered medications on file prior to visit.    ALLERGIES: Allergies  Allergen Reactions   Chloraprep One Step [Chlorhexidine Gluconate] Rash   Estrogens Other (See Comments)    PATIENT HAS A HISTORY OF CANCER AND HAS BEEN TOLD TO NEVER TAKE ANYTHING CONTAINING ESTROGEN, AS IT MIGHT CAUSE A RECURRENCE   Shrimp [Shellfish Allergy] Nausea And Vomiting    FAMILY HISTORY: Family History  Problem Relation Age of Onset   AAA (abdominal aortic aneurysm) Mother    Diabetes Father    Alzheimer's disease Father    Throat cancer Sister    Breast cancer Cousin     Objective:  Blood pressure 119/74, pulse 75, resp. rate 20, height 5' 4"  (1.626 m), weight 170 lb (77.1 kg), SpO2 96 %. General: No acute distress.  Patient appears well-groomed.   Head:  Normocephalic/atraumatic Eyes:  fundi examined but not visualized Neck: supple, bilateral paraspinal tenderness, full range of motion Back: Bilateral  paraspinal tenderness Heart: regular rate and rhythm Lungs: Clear to auscultation bilaterally. Vascular: No carotid bruits. Neurological Exam: Mental status: alert and oriented to person, place, and time, speech fluent and not dysarthric, language intact. Cranial nerves: CN I: not tested CN II: pupils equal, round and reactive to light, visual fields intact CN III, IV, VI:  full range of  motion, no nystagmus, no ptosis CN V: facial sensation intact. CN VII: upper and lower face symmetric CN VIII: hearing intact CN IX, X: gag intact, uvula midline CN XI: sternocleidomastoid and trapezius muscles intact CN XII: tongue midline Bulk & Tone: normal, no fasciculations. Motor:  muscle strength 5/5 throughout Sensation:  Pinprick and vibratory sensation reduced in feet. Deep Tendon Reflexes:  3+ throughout,  toes downgoing.   Finger to nose testing:  Without dysmetria.   Heel to shin:  Without dysmetria.   Gait:  Broad-based gait.  Romberg with mild sway.    Thank you for allowing me to take part in the care of this patient.  Metta Clines, DO  CC: [name], PA-C

## 2021-10-31 ENCOUNTER — Encounter: Payer: Self-pay | Admitting: Neurology

## 2021-10-31 ENCOUNTER — Ambulatory Visit: Payer: Medicare Other | Admitting: Neurology

## 2021-10-31 VITALS — BP 119/74 | HR 75 | Resp 20 | Ht 64.0 in | Wt 170.0 lb

## 2021-10-31 DIAGNOSIS — G4486 Cervicogenic headache: Secondary | ICD-10-CM | POA: Diagnosis not present

## 2021-10-31 DIAGNOSIS — M542 Cervicalgia: Secondary | ICD-10-CM | POA: Diagnosis not present

## 2021-10-31 MED ORDER — GABAPENTIN 300 MG PO CAPS: ORAL_CAPSULE | ORAL | 0 refills | Status: DC

## 2021-10-31 NOTE — Patient Instructions (Signed)
Increase gabapentin as directed to goal of 2 pills ('600mg'$ ) twice daily Use tizanidine as directed/needed for acute headache and neck pain.  Caution for drowsiness Will obtain records of neck images Limit use of pain relievers to no more than 2 days out of week to prevent risk of rebound or medication-overuse headache. Follow up 4-5 months.

## 2021-11-26 ENCOUNTER — Other Ambulatory Visit: Payer: Self-pay | Admitting: Neurology

## 2021-11-26 MED ORDER — GABAPENTIN 600 MG PO TABS: 600.0000 mg | ORAL_TABLET | Freq: Two times a day (BID) | ORAL | 5 refills | Status: DC

## 2021-11-26 NOTE — Telephone Encounter (Signed)
Refilled as one '600mg'$  capsule twice daily

## 2021-11-26 NOTE — Telephone Encounter (Signed)
Pharmacy called in wanting to double check the prescription for Gabapentin. The pt says she should be taking 2 capsules twice daily?

## 2021-11-27 LAB — BASIC METABOLIC PANEL
BUN: 18 (ref 4–21)
CO2: 21 (ref 13–22)
Chloride: 102 (ref 99–108)
Creatinine: 0.7 (ref 0.5–1.1)
Glucose: 301
Potassium: 4.6 mEq/L (ref 3.5–5.1)
Sodium: 139 (ref 137–147)

## 2021-11-27 LAB — CBC AND DIFFERENTIAL
HCT: 42 (ref 36–46)
Hemoglobin: 13.3 (ref 12.0–16.0)
Neutrophils Absolute: 2.9
Platelets: 255 10*3/uL (ref 150–400)
WBC: 10.9

## 2021-11-27 LAB — POCT ERYTHROCYTE SEDIMENTATION RATE, NON-AUTOMATED: Sed Rate: 7

## 2021-11-27 LAB — COMPREHENSIVE METABOLIC PANEL
Albumin: 4.7 (ref 3.5–5.0)
Calcium: 10.6 (ref 8.7–10.7)
Globulin: 1.9
eGFR: 89

## 2021-11-27 LAB — HEPATIC FUNCTION PANEL
Alkaline Phosphatase: 50 (ref 25–125)
Bilirubin, Total: 0.2

## 2021-11-27 LAB — CBC: RBC: 4.36 (ref 3.87–5.11)

## 2021-12-10 ENCOUNTER — Encounter: Payer: Self-pay | Admitting: Physician Assistant

## 2021-12-24 ENCOUNTER — Encounter: Payer: Self-pay | Admitting: Physician Assistant

## 2021-12-24 ENCOUNTER — Ambulatory Visit (INDEPENDENT_AMBULATORY_CARE_PROVIDER_SITE_OTHER): Payer: Medicare Other | Admitting: Physician Assistant

## 2021-12-24 VITALS — BP 130/76 | HR 73 | Temp 97.5°F | Ht 64.0 in | Wt 170.2 lb

## 2021-12-24 DIAGNOSIS — F419 Anxiety disorder, unspecified: Secondary | ICD-10-CM

## 2021-12-24 DIAGNOSIS — N3281 Overactive bladder: Secondary | ICD-10-CM | POA: Diagnosis not present

## 2021-12-24 DIAGNOSIS — M154 Erosive (osteo)arthritis: Secondary | ICD-10-CM | POA: Diagnosis not present

## 2021-12-24 DIAGNOSIS — E1165 Type 2 diabetes mellitus with hyperglycemia: Secondary | ICD-10-CM | POA: Diagnosis not present

## 2021-12-24 DIAGNOSIS — F32A Depression, unspecified: Secondary | ICD-10-CM

## 2021-12-24 DIAGNOSIS — R519 Headache, unspecified: Secondary | ICD-10-CM

## 2021-12-24 DIAGNOSIS — M25551 Pain in right hip: Secondary | ICD-10-CM

## 2021-12-24 DIAGNOSIS — E119 Type 2 diabetes mellitus without complications: Secondary | ICD-10-CM

## 2021-12-24 LAB — POCT GLYCOSYLATED HEMOGLOBIN (HGB A1C): Hemoglobin A1C: 7.3 % — AB (ref 4.0–5.6)

## 2021-12-24 LAB — MICROALBUMIN / CREATININE URINE RATIO
Creatinine,U: 30.6 mg/dL
Microalb Creat Ratio: 2.3 mg/g (ref 0.0–30.0)
Microalb, Ur: <0.7 mg/dL (ref 0.0–1.9)

## 2021-12-24 MED ORDER — DEPEND ADJUSTABLE UNDERWEAR MISC: 1.0000 | Freq: Every day | 11 refills | Status: AC

## 2021-12-24 MED ORDER — TRAMADOL HCL 50 MG PO TABS: 50.0000 mg | ORAL_TABLET | Freq: Three times a day (TID) | ORAL | 2 refills | Status: DC | PRN

## 2021-12-24 NOTE — Patient Instructions (Signed)
Great to see you today! Very good work on lowering your your diabetes levels!  Your A1c was 7.3% today, which is a wonderful accomplishment!  Continue on regular medications.  Limit Tylenol to 3000 mg daily max.  Prescription for tramadol 50 mg to take up to 3 times daily for added pain relief.  Let me know if any problems with this medication.  Caution with driving.  Referral sent to sports medicine to see them for assistance with your right hip issues.  Depend's prescription sent in for you as well.  Thank you for completing microalbumin sample today.

## 2021-12-24 NOTE — Progress Notes (Signed)
Subjective:    Patient ID: Felicia [name], female    DOB: [birth year], 71 y.o.   MRN: [id number]  Chief Complaint  Patient presents with   Follow-up    Pt in for 3 mon f/u; pt c/o of bing in a lot of pain from waist down primarily right leg and hip; pt not fasting and wasn't aware she needed to be; pt brought in glucose readings but admits not been keeping track with them as well as she should; pt not sleeping well;     HPI Patient is in today for follow-up.   Seeing Dr. Trudie Reed - primary osteoarthrosis multiple sites. Taking Celebrex 200 mg BID, but not noticing much difference. Not sleeping well. Says she's in a lot of pain all of the time. 5-6/10 current pain level today, says this is where she averages daily.  Dr. Tomi Likens increased gabapentin to 600 mg twice daily. Still having headaches which fluctuate in intensity.   Using inhaler more recently due to weather changes. Says she has hx of emphysema. Grew up around smokers.  AM glucose readings - 163 this morning; 148 yesterday.   Past Medical History:  Diagnosis Date   Anemia    Anxiety    Breast cancer ([place]) 09/2019   left breast IMC   CAD (coronary artery disease)    a. 3/2007s/p DES to the LAD [facility]); b. 01/2017 MV: EF 80%, small, mild apical ant defect w/o ischemia (felt to be breast atten). Low risk.   Depression    DM (diabetes mellitus) ([place])    Essential hypertension    GERD (gastroesophageal reflux disease)    Headache    secondary to neck surgery per patient   High triglycerides    Hyperlipidemia    Hypothyroid    OA (osteoarthritis)    right knee,hands   Overflow incontinence    PONV (postoperative nausea and vomiting)    s/p gallbladder    RLS (restless legs syndrome)    Urinary urgency    Uterine cancer ([employer]) dx'd 2014    Past Surgical History:  Procedure Laterality Date   BREAST LUMPECTOMY WITH RADIOACTIVE SEED AND SENTINEL LYMPH NODE BIOPSY Left 10/07/2019   Procedure: LEFT BREAST LUMPECTOMY  WITH RADIOACTIVE SEED AND SENTINEL LYMPH NODE BIOPSY;  Surgeon: Rolm Bookbinder, MD;  Location: [place];  Service: General;  Laterality: Left;   CORONARY STENT PLACEMENT     CORONARY/GRAFT ACUTE MI REVASCULARIZATION     FINGER ARTHRODESIS Right 06/11/2019   Procedure: ARTHRODESIS INDEX FINGER DISTAL PHALANGEAL JOINT;  Surgeon: [name], MD;  Location: [place];  Service: Orthopedics;  Laterality: Right;   FOOT SURGERY Left    LEFT HEART CATH AND CORONARY ANGIOGRAPHY N/A 02/16/2018   Procedure: LEFT HEART CATH AND CORONARY ANGIOGRAPHY;  Surgeon: Lorretta Harp, MD;  Location: [place] CV [facility];  Service: Cardiovascular;  Laterality: N/A;   LEFT HEART CATH AND CORONARY ANGIOGRAPHY N/A 05/01/2020   Procedure: LEFT HEART CATH AND CORONARY ANGIOGRAPHY;  Surgeon: Lorretta Harp, MD;  Location: [place] [facility];  Service: Cardiovascular;  Laterality: N/A;   PORTACATH PLACEMENT Right 11/23/2019   Procedure: INSERTION PORT-A-CATH WITH ULTRASOUND GUIDANCE;  Surgeon: Rolm Bookbinder, MD;  Location: [place];  Service: General;  Laterality: Right;   REPLACEMENT TOTAL KNEE Right    SPINE SURGERY     TOTAL ABDOMINAL HYSTERECTOMY     FOR UTERUS CANCER   TOTAL HIP ARTHROPLASTY Left 2015    Family  History  Problem Relation Age of Onset   AAA (abdominal aortic aneurysm) Mother    Diabetes Father    Alzheimer's disease Father    Throat cancer Sister    Breast cancer Cousin     Social History   Tobacco Use   Smoking status: Never   Smokeless tobacco: Never  Vaping Use   Vaping Use: Never used  Substance Use Topics   Alcohol use: Yes    Comment: OCCASIONALLY   Drug use: No     Allergies  Allergen Reactions   Chloraprep One Step [Chlorhexidine Gluconate] Rash   Estrogens Other (See Comments)    PATIENT HAS A HISTORY OF CANCER AND HAS BEEN TOLD TO NEVER TAKE ANYTHING CONTAINING ESTROGEN, AS IT MIGHT CAUSE A RECURRENCE    Shrimp [Shellfish Allergy] Nausea And Vomiting    Review of Systems NEGATIVE UNLESS OTHERWISE INDICATED IN HPI      Objective:     BP 130/76 (BP Location: Left Arm)   Pulse 73   Temp (!) 97.5 F (36.4 C) (Temporal)   Ht '5\' 4"'$  (1.626 m)   Wt 170 lb 3.2 oz (77.2 kg)   SpO2 97%   BMI 29.21 kg/m   Wt Readings from Last 3 Encounters:  12/24/21 170 lb 3.2 oz (77.2 kg)  10/31/21 170 lb (77.1 kg)  09/25/21 172 lb 9.6 oz (78.3 kg)    BP Readings from Last 3 Encounters:  12/24/21 130/76  10/31/21 119/74  09/25/21 128/76     Physical Exam Constitutional:      General: She is not in acute distress.    Appearance: Normal appearance. She is not toxic-appearing.  Cardiovascular:     Rate and Rhythm: Normal rate and regular rhythm.     Pulses: Normal pulses.     Heart sounds: Normal heart sounds.  Pulmonary:     Effort: Pulmonary effort is normal.     Breath sounds: Normal breath sounds.  Skin:    Findings: No rash.  Neurological:     General: No focal deficit present.     Mental Status: She is alert.     Cranial Nerves: No cranial nerve deficit.     Motor: No weakness.     Gait: Gait normal.  Psychiatric:        Mood and Affect: Mood normal.        Behavior: Behavior normal.        Assessment & Plan:  Type 2 diabetes mellitus with hyperglycemia, without long-term current use of insulin (HCC) -     POCT glycosylated hemoglobin (Hb A1C) -     Microalbumin / creatinine urine ratio  Erosive osteoarthritis of multiple sites Assessment & Plan: Still working with Dr. Trudie Reed. She is taking Celebrex 200 mg twice daily. Limit Tylenol to 3000 mg daily max.  Prescription for tramadol 50 mg to take up to 3 times daily for added pain relief.  Let me know if any problems with this medication.  Caution with driving.   Right hip pain -     Ambulatory referral to Sports Medicine  Overactive bladder -     Depend Adjustable Underwear; 1 Application by Does not apply route  daily.  Dispense: 16 each; Refill: 11  Anxiety and depression Assessment & Plan: Stable at this time.  She will continue on Lexapro 20 mg daily.   Type 2 diabetes mellitus without complication, without long-term current use of insulin [facility]) Assessment & Plan: Lab Results  Component Value Date  HGBA1C 7.3 (A) 12/24/2021   She is doing wonderful bringing her A1c down. Continue metformin 1000 mg twice daily, glipizide XL 20 mg daily. Trulicity 3 mg once weekly.  Jardiance 25 mg once daily. Continue to monitor glucose readings at home.  Call if any concerns. Microalbumin urine checked today.   Other orders -     traMADol HCl; Take 1 tablet (50 mg total) by mouth 3 (three) times daily as needed for moderate pain or severe pain.  Dispense: 90 tablet; Refill: 2        Return in about 3 months (around 03/25/2022) for recheck.  This note was prepared with assistance of Systems analyst. Occasional wrong-word or sound-a-like substitutions may have occurred due to the inherent limitations of voice recognition software.      Lorie Cleckley M Onelia Cadmus, PA-C

## 2021-12-25 ENCOUNTER — Telehealth: Payer: Self-pay | Admitting: Physician Assistant

## 2021-12-25 NOTE — Telephone Encounter (Signed)
Pt request: -script to be filled for 90 days instead of 30 days.   LAST APPOINTMENT DATE:   12/24/21 OV with PCP   NEXT APPOINTMENT DATE: 03/24/21 OV with PCP   MEDICATION:  Dulaglutide (TRULICITY) 3 QH/4.7ML SOPN [[id number]]    Is the patient out of medication?  unknown   PHARMACY:  [facility] - [place], [place] - [address], [place] [address] Phone: [phone]  Fax: [phone]

## 2021-12-26 ENCOUNTER — Other Ambulatory Visit: Payer: Self-pay

## 2021-12-26 MED ORDER — TRULICITY 3 MG/0.5ML ~~LOC~~ SOAJ: 3.0000 mg | SUBCUTANEOUS | 0 refills | Status: DC

## 2021-12-26 NOTE — Telephone Encounter (Signed)
Sent 90 day Rx to [facility] per pt request.

## 2021-12-28 NOTE — Assessment & Plan Note (Signed)
Stable at this time.  She will continue on Lexapro 20 mg daily.

## 2021-12-28 NOTE — Assessment & Plan Note (Signed)
Lab Results  Component Value Date   HGBA1C 7.3 (A) 12/24/2021   She is doing wonderful bringing her A1c down. Continue metformin 1000 mg twice daily, glipizide XL 20 mg daily. Trulicity 3 mg once weekly.  Jardiance 25 mg once daily. Continue to monitor glucose readings at home.  Call if any concerns. Microalbumin urine checked today.

## 2021-12-28 NOTE — Assessment & Plan Note (Signed)
Still working with Dr. Trudie Reed. She is taking Celebrex 200 mg twice daily. Limit Tylenol to 3000 mg daily max.  Prescription for tramadol 50 mg to take up to 3 times daily for added pain relief.  Let me know if any problems with this medication.  Caution with driving.

## 2022-01-10 ENCOUNTER — Encounter: Payer: Self-pay | Admitting: Physician Assistant

## 2022-01-14 ENCOUNTER — Encounter: Payer: Self-pay | Admitting: Oncology

## 2022-01-17 ENCOUNTER — Other Ambulatory Visit: Payer: Self-pay | Admitting: Physician Assistant

## 2022-01-17 ENCOUNTER — Other Ambulatory Visit: Payer: Self-pay | Admitting: Cardiovascular Disease

## 2022-01-18 ENCOUNTER — Telehealth: Payer: Self-pay

## 2022-01-18 NOTE — Telephone Encounter (Signed)
Pt insurance company called with questions for PA regarding approval for Trulicity. Advised Dx codes and not being used for weight loss purposes. Insurance advised they would fax determination within 24-72 hours

## 2022-01-21 ENCOUNTER — Ambulatory Visit: Payer: Medicare HMO | Attending: General Surgery

## 2022-01-21 VITALS — Wt 167.2 lb

## 2022-01-21 DIAGNOSIS — Z483 Aftercare following surgery for neoplasm: Secondary | ICD-10-CM

## 2022-01-21 DIAGNOSIS — R6889 Other general symptoms and signs: Secondary | ICD-10-CM | POA: Diagnosis not present

## 2022-01-21 NOTE — Therapy (Signed)
OUTPATIENT PHYSICAL THERAPY SOZO SCREENING NOTE   Patient Name: Felicia [name] MRN: [id number] DOB:1950-05-27, 72 y.o., female Today's Date: 01/21/2022  PCP: Fredirick Lathe, PA-C REFERRING PROVIDER: Rolm Bookbinder, MD   PT End of Session - 01/21/22 1511     Visit Number 3   # unchanged due to screen only   PT Start Time 1509    PT Stop Time 1513    PT Time Calculation (min) 4 min    Activity Tolerance Patient tolerated treatment well    Behavior During Therapy [facility] for tasks assessed/performed             Past Medical History:  Diagnosis Date   Anemia    Anxiety    Breast cancer ([place]) 09/2019   left breast IMC   CAD (coronary artery disease)    a. 3/2007s/p DES to the LAD [facility]); b. 01/2017 MV: EF 80%, small, mild apical ant defect w/o ischemia (felt to be breast atten). Low risk.   Depression    DM (diabetes mellitus) ([place])    Essential hypertension    GERD (gastroesophageal reflux disease)    Headache    secondary to neck surgery per patient   High triglycerides    Hyperlipidemia    Hypothyroid    OA (osteoarthritis)    right knee,hands   Overflow incontinence    PONV (postoperative nausea and vomiting)    s/p gallbladder    RLS (restless legs syndrome)    Urinary urgency    Uterine cancer ([place]) dx'd 2014   Past Surgical History:  Procedure Laterality Date   BREAST LUMPECTOMY WITH RADIOACTIVE SEED AND SENTINEL LYMPH NODE BIOPSY Left 10/07/2019   Procedure: LEFT BREAST LUMPECTOMY WITH RADIOACTIVE SEED AND SENTINEL LYMPH NODE BIOPSY;  Surgeon: Rolm Bookbinder, MD;  Location: [place];  Service: General;  Laterality: Left;   CORONARY STENT PLACEMENT     CORONARY/GRAFT ACUTE MI REVASCULARIZATION     FINGER ARTHRODESIS Right 06/11/2019   Procedure: ARTHRODESIS INDEX FINGER DISTAL PHALANGEAL JOINT;  Surgeon: [name], MD;  Location: [place];  Service: Orthopedics;  Laterality: Right;   FOOT SURGERY Left     LEFT HEART CATH AND CORONARY ANGIOGRAPHY N/A 02/16/2018   Procedure: LEFT HEART CATH AND CORONARY ANGIOGRAPHY;  Surgeon: Lorretta Harp, MD;  Location: [place] CV [facility];  Service: Cardiovascular;  Laterality: N/A;   LEFT HEART CATH AND CORONARY ANGIOGRAPHY N/A 05/01/2020   Procedure: LEFT HEART CATH AND CORONARY ANGIOGRAPHY;  Surgeon: Lorretta Harp, MD;  Location: [place] CV [facility];  Service: Cardiovascular;  Laterality: N/A;   PORTACATH PLACEMENT Right 11/23/2019   Procedure: INSERTION PORT-A-CATH WITH ULTRASOUND GUIDANCE;  Surgeon: Rolm Bookbinder, MD;  Location: [place];  Service: General;  Laterality: Right;   REPLACEMENT TOTAL KNEE Right    SPINE SURGERY     TOTAL ABDOMINAL HYSTERECTOMY     FOR UTERUS CANCER   TOTAL HIP ARTHROPLASTY Left 2015   Patient Active Problem List   Diagnosis Date Noted   Erosive osteoarthritis of multiple sites 09/26/2021   Frequent headaches 05/31/2021   Hoarse 01/25/2021   Hyperglycemia due to type 2 diabetes mellitus ([place]) 01/18/2021   Mixed simple and mucopurulent chronic bronchitis ([place]) 12/27/2020   Centrilobular emphysema ([place]) 07/31/2020   Shortness of breath 07/31/2020   Pressure injury of skin 04/30/2020   Hypothyroid    Anxiety    Anxiety and depression    Port-A-Cath in place 11/24/2019   Hepatic  steatosis 09/28/2019   Malignant neoplasm of upper-outer quadrant of left breast in female, estrogen receptor positive ([place]) 09/02/2019   Trigger index finger of right hand 05/05/2019   Primary osteoarthritis of first carpometacarpal joint of right hand 05/05/2019   Endometrial cancer ([place]) 10/23/2018   Cirrhosis of liver without ascites ([place]) 10/23/2018   Diarrhea 10/23/2018   Chest pain 02/15/2018   Coronary artery disease involving native coronary artery of native heart without angina pectoris 11/18/2017   Hyperlipidemia LDL goal <70 11/18/2017   Type 2 diabetes mellitus without complication, without long-term  current use of insulin ([place]) 11/18/2017   Dizziness 11/18/2017    REFERRING DIAG: left breast cancer at risk for lymphedema  THERAPY DIAG: Aftercare following surgery for neoplasm  PERTINENT HISTORY: Patient was diagnosed on 08/03/2019 with 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 She underwent a left lumpectomy and 1 positive node removed on 10/07/2019. Ki67 is 20%. She has a history of uterine cancer in 2014 treated with hysterectomy and radiation, a left hip replacement, right knee replacement, and a C4-C7 fusion in 2014.  PRECAUTIONS: left UE Lymphedema risk, None  SUBJECTIVE: Pt returns for her  3 month L-Dex screen.   PAIN:  Are you having pain? No  SOZO SCREENING: Patient was assessed today using the SOZO machine to determine the lymphedema index score. This was compared to her baseline score. It was determined that she is within the recommended range when compared to her baseline and no further action is needed at this time. She will continue SOZO screenings. These are done every 3 months for 2 years post operatively followed by every 6 months for 2 years, and then annually.   L-DEX FLOWSHEETS - 01/21/22 1500       L-DEX LYMPHEDEMA SCREENING   Measurement Type Unilateral    L-DEX MEASUREMENT EXTREMITY Upper Extremity    POSITION  Standing    DOMINANT SIDE Right    At Risk Side Left    BASELINE SCORE (UNILATERAL) 3.3    L-DEX SCORE (UNILATERAL) 3.2    VALUE CHANGE (UNILAT) -0.1              Otelia Limes, PTA 01/21/2022, 3:13 PM

## 2022-01-23 NOTE — Telephone Encounter (Signed)
Patient states: - She is still without her medication and has officially ran out  - She is suppose to take her shot on Tuesdays  Patient would like to know what to do next if she can't have her medication prior to Tuesday. Did we receive confirmation from insurance? Please Advise.

## 2022-01-24 ENCOUNTER — Other Ambulatory Visit: Payer: Self-pay

## 2022-01-24 ENCOUNTER — Telehealth: Payer: Self-pay

## 2022-01-24 DIAGNOSIS — E119 Type 2 diabetes mellitus without complications: Secondary | ICD-10-CM

## 2022-01-24 MED ORDER — TRULICITY 3 MG/0.5ML ~~LOC~~ SOAJ: 3.0000 mg | SUBCUTANEOUS | 0 refills | Status: DC

## 2022-01-24 NOTE — Telephone Encounter (Signed)
Grievance appeal filed today over the phone with [employer]. Told there should be a response in 72 hours.

## 2022-01-24 NOTE — Telephone Encounter (Signed)
Called pt to advise filed Grievance with [employer] for PA that was denied for pt Trulicity. Advised 1 time PA process then 6 month wait for next PA if denied. Waiting 72 hours for fax determination and will contact pt with info

## 2022-01-24 NOTE — Telephone Encounter (Signed)
PA was denied for Trulicity, should we change medication or have patient contact [employer]? According to cover my meds Grievance/appeal would need to be filed

## 2022-01-25 NOTE — Telephone Encounter (Signed)
Received fax determination that Grievance/Appeal was approved for patient to receive Trulicity '3mg'$  from 01/14/22-01/14/23.   Patient is already aware and medication is out for delivery today.

## 2022-01-29 ENCOUNTER — Ambulatory Visit: Payer: Medicare HMO | Admitting: Family Medicine

## 2022-01-29 VITALS — BP 138/74 | HR 81 | Ht 64.0 in | Wt 169.0 lb

## 2022-01-29 DIAGNOSIS — M7061 Trochanteric bursitis, right hip: Secondary | ICD-10-CM

## 2022-01-29 DIAGNOSIS — R6889 Other general symptoms and signs: Secondary | ICD-10-CM | POA: Diagnosis not present

## 2022-01-29 DIAGNOSIS — M542 Cervicalgia: Secondary | ICD-10-CM | POA: Diagnosis not present

## 2022-01-29 NOTE — Patient Instructions (Addendum)
Thank you for coming in today.   I've referred you to Physical Therapy.  Let us know if you don't hear from them in one week.   Recheck in 6 weeks.   If this is not working let me know.

## 2022-01-29 NOTE — Progress Notes (Signed)
I, Felicia [name], LAT, ATC acting as a scribe for Felicia Leader, MD.  Subjective:    CC: R hip pain  HPI: Pt is a 72 y/o female c/o R hip pain ongoing for years, worsening. Pt locates pain to deep within the R buttock w/ radiating pain down the R whole leg, stopping mid-shin. Pt notes that when it's really irritated pain will wrap around her waist..   Pain is worse at the right buttocks and radiates down to the lateral calf.  Low back pain: no Radiates: yes LE Numbness/tingling: yes LE Weakness: yes- uses her cane to ambulate Aggravates: stairs Treatments tried: Celebrex, staying active, gabapentin, tramadol  Pt also c/o bilat trap pain. Pain will radiate up both sides of her neck, L>R, causing HA.   Aggravates: cervical rotation  Dx imaging: 04/19/21 Bilat hip XR  Pertinent review of Systems: No fevers or chills.  Relevant historical information: Diabetes, history of breast cancer, history of endometrial cancer   Objective:    Vitals:   01/29/22 1247  BP: 138/74  Pulse: 81  SpO2: 94%   General: Well Developed, well nourished, and in no acute distress.   MSK:   C-spine: Normal appearing Nontender cervical midline. Tender palpation trapezius bilaterally.  Right hip: Normal-appearing Normal hip motion. Tender palpation posterior aspect of the greater trochanter. Hip abduction strength is diminished 4/5 with pain.  External rotation strength is intact with mild pain.  L-spine: Normal appearing nontender midline.  Normal lumbar motion.  Positive right-sided slump test.  Lab and Radiology Results  Images of lumbar spine and right hip visible on CT scan abdomen and pelvis obtained August 09, 2021 personally and independently interpreted today. No severe right hip DJD. Mild degenerative changes present lower portion lumbar spine.   CLINICAL DATA: Cervicalgia  EXAM: CERVICAL SPINE - 2-3 VIEW  COMPARISON: None.  FINDINGS: Frontal and lateral views were  obtained. There is posterior laminar screw and plate fixation from B2-W4 with support hardware intact. No fracture or spondylolisthesis. Prevertebral soft tissues and predental space regions are normal. There is moderate disc space narrowing at C4-5 with more severe disc space narrowing at C5-6 and C6-7. No erosion.  Lung apices are clear. Port-A-Cath present on the right.  IMPRESSION: Postoperative posterior fixation from C3-C7 with support hardware intact. No fracture or spondylolisthesis. Disc space narrowing at C4-5, C5-6, and C6-7, most severe at C5-6 and C6-7.   Electronically Signed By: Lowella Grip III M.D. On: 12/30/2019 14:06  Impression and Recommendations:    Assessment and Plan: 72 y.o. female with right lateral hip pain thought to be predominantly due to trochanteric bursitis/hip abductor tendinopathy.  She also has some pain radiating down the leg to the lateral calf thought to be perhaps L5 lumbar radiculopathy.  She is a good candidate for physical therapy for both of these issues.  She already takes gabapentin at bedtime prescribed by her neurologist which does help the but I think is radicular pain.  Physical therapy should help with strengthening which I think would likely resolve this problem.  Additionally she has pain bilateral trapezius thought to be muscle spasm and dysfunction.  Again physical therapy should help quite a bit for this.  Plan to refer to PT.  Check back in about 6 weeks.Marland Kitchen  PDMP not reviewed this encounter. Orders Placed This Encounter  Procedures   Ambulatory referral to Physical Therapy    Referral Priority:   Routine    Referral Type:   Physical Medicine  Referral Reason:   Specialty Services Required    Requested Specialty:   Physical Therapy    Number of Visits Requested:   1   No orders of the defined types were placed in this encounter.   Discussed warning signs or symptoms. Please see discharge instructions. Patient  expresses understanding.   The above documentation has been reviewed and is accurate and complete Felicia [name], M.D.

## 2022-01-31 ENCOUNTER — Telehealth: Payer: Self-pay

## 2022-01-31 NOTE — Telephone Encounter (Signed)
Pt called w/ f/u questions after her visit. Pt was wondering her leg weakness could be related to the "pinched nerve" in her back. She was told it may be related or that she could just have weakened muscle strength in that leg. Pt was encouraged to call and get PT scheduled. Pt verbalized understanding.

## 2022-02-06 ENCOUNTER — Ambulatory Visit: Payer: Medicare HMO | Admitting: Physical Therapy

## 2022-02-06 ENCOUNTER — Encounter: Payer: Self-pay | Admitting: Physical Therapy

## 2022-02-06 DIAGNOSIS — M542 Cervicalgia: Secondary | ICD-10-CM

## 2022-02-06 DIAGNOSIS — M6281 Muscle weakness (generalized): Secondary | ICD-10-CM

## 2022-02-06 DIAGNOSIS — M25551 Pain in right hip: Secondary | ICD-10-CM | POA: Diagnosis not present

## 2022-02-06 DIAGNOSIS — R6889 Other general symptoms and signs: Secondary | ICD-10-CM | POA: Diagnosis not present

## 2022-02-06 NOTE — Therapy (Signed)
OUTPATIENT PHYSICAL THERAPY LOWER EXTREMITY EVALUATION   Patient Name: Felicia [name] MRN: [id number] DOB:07-02-1950, 72 y.o., female Today's Date: 02/06/2022  END OF SESSION:  PT End of Session - 02/06/22 1238     Visit Number 1    Number of Visits 12    Date for PT Re-Evaluation 05/01/22    Authorization Type [employer] requested    PT Start Time 1250    PT Stop Time 1342    PT Time Calculation (min) 52 min    Activity Tolerance Patient tolerated treatment well             Past Medical History:  Diagnosis Date   Anemia    Anxiety    Breast cancer ([place]) 09/2019   left breast IMC   CAD (coronary artery disease)    a. 3/2007s/p DES to the L[facility]); b. 01/2017 MV: EF 80%, small, mild apical ant defect w/o ischemia (felt to be breast atten). Low risk.   Depression    DM (diabetes mellitus) ([place])    Essential hypertension    GERD (gastroesophageal reflux disease)    Headache    secondary to neck surgery per patient   High triglycerides    Hyperlipidemia    Hypothyroid    OA (osteoarthritis)    right knee,hands   Overflow incontinence    PONV (postoperative nausea and vomiting)    s/p gallbladder    RLS (restless legs syndrome)    Urinary urgency    Uterine cancer ([place]) dx'd 2014   Past Surgical History:  Procedure Laterality Date   BREAST LUMPECTOMY WITH RADIOACTIVE SEED AND SENTINEL LYMPH NODE BIOPSY Left 10/07/2019   Procedure: LEFT BREAST LUMPECTOMY WITH RADIOACTIVE SEED AND SENTINEL LYMPH NODE BIOPSY;  Surgeon: Rolm Bookbinder, MD;  Location: [facility];  Service: General;  Laterality: Left;   CORONARY STENT PLACEMENT     CORONARY/GRAFT ACUTE MI REVASCULARIZATION     FINGER ARTHRODESIS Right 06/11/2019   Procedure: ARTHRODESIS INDEX FINGER DISTAL PHALANGEAL JOINT;  Surgeon: [name], MD;  Location: [place];  Service: Orthopedics;  Laterality: Right;   FOOT SURGERY Left    LEFT HEART CATH AND CORONARY  ANGIOGRAPHY N/A 02/16/2018   Procedure: LEFT HEART CATH AND CORONARY ANGIOGRAPHY;  Surgeon: Lorretta Harp, MD;  Location: [place] [facility];  Service: Cardiovascular;  Laterality: N/A;   LEFT HEART CATH AND CORONARY ANGIOGRAPHY N/A 05/01/2020   Procedure: LEFT HEART CATH AND CORONARY ANGIOGRAPHY;  Surgeon: Lorretta Harp, MD;  Location: [place] [facility];  Service: Cardiovascular;  Laterality: N/A;   PORTACATH PLACEMENT Right 11/23/2019   Procedure: INSERTION PORT-A-CATH WITH ULTRASOUND GUIDANCE;  Surgeon: Rolm Bookbinder, MD;  Location: [place];  Service: General;  Laterality: Right;   REPLACEMENT TOTAL KNEE Right    SPINE SURGERY     TOTAL ABDOMINAL HYSTERECTOMY     FOR UTERUS CANCER   TOTAL HIP ARTHROPLASTY Left 2015   Patient Active Problem List   Diagnosis Date Noted   Erosive osteoarthritis of multiple sites 09/26/2021   Frequent headaches 05/31/2021   Hoarse 01/25/2021   Hyperglycemia due to type 2 diabetes mellitus ([place]) 01/18/2021   Mixed simple and mucopurulent chronic bronchitis ([place]) 12/27/2020   Centrilobular emphysema ([place]) 07/31/2020   Shortness of breath 07/31/2020   Pressure injury of skin 04/30/2020   Hypothyroid    Anxiety    Anxiety and depression    Port-A-Cath in place 11/24/2019   Hepatic steatosis 09/28/2019  Malignant neoplasm of upper-outer quadrant of left breast in female, estrogen receptor positive ([place]) 09/02/2019   Trigger index finger of right hand 05/05/2019   Primary osteoarthritis of first carpometacarpal joint of right hand 05/05/2019   Endometrial cancer ([place]) 10/23/2018   Cirrhosis of liver without ascites ([place]) 10/23/2018   Diarrhea 10/23/2018   Chest pain 02/15/2018   Coronary artery disease involving native coronary artery of native heart without angina pectoris 11/18/2017   Hyperlipidemia LDL goal <70 11/18/2017   Type 2 diabetes mellitus without complication, without long-term current use of insulin ([place])  11/18/2017   Dizziness 11/18/2017    PCP: Fredirick Lathe, PA  REFERRING PROVIDER: Gregor Hams, MD  REFERRING DIAG: M70.61 (ICD-10-CM) - Trochanteric bursitis of right hip M54.2 (ICD-10-CM) - Neck pain  THERAPY DIAG:  Pain in right hip  Muscle weakness (generalized)  Neck pain  Rationale for Evaluation and Treatment: Rehabilitation  ONSET DATE: years  SUBJECTIVE:   SUBJECTIVE STATEMENT: States that she has had right hip pain for a while off and on. States she feels it is doing alright but it is "cooking" to be redone. States she has pain with sitting. States that sometimes her right leg collapses on her and this has happened for years and her right leg would give ou on her. States she had multiple knee surgeries and a replacement which didn't help. States that her pain wraps around from her right hip the front of her thigh and into her shin.   Reports she knows her legs are weak which is why she avoids stairs. States she used to walk 4 miles a day but then she had difficulties with her left hip and haven't walked a lot since. States she is now having difficulties getting in and out of a car and she has to pick up both legs to get in and out of her car. Reports anytime she has to put the fitted sheet on her bed she falls (onto her bed).   States her neck/shoulder pain is always there and she gets headaches from it. States she is going to a neurologist who gave her gabapentin for her shoulder pain. States she feels like massage helps a bit - she has one of the self release hooks to help. States she has to be careful with her pocketbook. States she has a torn tendon on her right shoulder.    States she veers right when she walks and her MD said her balance will always be the same. Reports she uses the cane but tries to walk without her cane.   PERTINENT HISTORY: Diabetes, history of breast cancer (LEFT), history of endometrial cancer (with history of radiation), spine surgery  C3-C7 fusion 2017, heart disease, L THA 2015, history of 3 right knee scopes and right TKA, history of inner ear issue (required epley  maneuver), PAIN:  Are you having pain? Yes: NPRS scale: 8/10 Pain location: right read end Pain description: constant ache Aggravating factors: walking, stairs, sitting long periods of time  Relieving factors: uses cane when in pain  Neck pain: stiff 5/10   Rib pain: -- after radiation for breast cancer - arthritis in her spine and ribs 4/10 - achy PRECAUTIONS: None  WEIGHT BEARING RESTRICTIONS: No  FALLS:  Has patient fallen in last 6 months? Yes. Number of falls 1  LIVING ENVIRONMENT: Lives with: lives alone Lives in: House/apartment Stairs: No Has following equipment at home: Single point cane, Environmental consultant - 2 wheeled, and Grab bars  OCCUPATION: not working  PLOF: Independent with household mobility with device and Independent with community mobility with device  PATIENT GOALS: to be able to walk further- currently working 5 minutes with a cane with ease, to have less pain      OBJECTIVE:   DIAGNOSTIC FINDINGS:  FINDINGS: Pelvic ring is intact. Left hip replacement is noted in satisfactory position. Mild degenerative changes of the right hip are seen. No acute fracture or dislocation is noted. No soft tissue abnormality is seen.   IMPRESSION: No acute abnormality noted.   COGNITION: Overall cognitive status: Within functional limits for tasks assessed       EDEMA:  None   POSTURE: rounded shoulders, forward head, increased thoracic kyphosis, and posterior pelvic tilt  PALPATION: Next session    LE Measurements Lower Extremity Right 02/06/2022 Left 02/06/2022   A/PROM MMT A/PROM MMT  Hip Flexion  4-  3+  Hip Extension      Hip Abduction (seated)  3+  3+  Hip Adduction (seated)  4-  4-  Hip Internal rotation      Hip External rotation      Knee Flexion  3+  4-  Knee Extension  3  4  Ankle Dorsiflexion      Ankle  Plantarflexion      Ankle Inversion      Ankle Eversion       (Blank rows = not tested) * pain    UE Measurements Upper Extremity Right 02/06/2022 Left 02/06/2022   A/PROM MMT A/PROM MMT  Shoulder Flexion      Shoulder Extension      Shoulder Abduction      Shoulder Adduction      Shoulder Internal Rotation      Shoulder External Rotation      Elbow Flexion      Elbow Extension      Wrist Flexion      Wrist Extension      Wrist Supination      Wrist Pronation      Wrist Ulnar Deviation      Wrist Radial Deviation      Grip Strength NA  NA     (Blank rows = not tested)   * pain  LOWER EXTREMITY SPECIAL TESTS:  Slump test - neg B  FUNCTIONAL TESTS:   5x STS 23.64 seconds no UE use but fearful of falling - increased hip pain  GAIT: Distance walked: 275 feet  Assistive device utilized: Single point cane Level of assistance: Modified independence Comments: limited lumbar and hip ROM- veers right - 2MW test   TODAY'S TREATMENT:                                                                                                                              DATE:   02/06/2022  Therapeutic Exercise:  Aerobic: Supine: Prone:  Seated: piriformis stretch x3 30" holds  Standing: Neuromuscular Re-education: Manual Therapy: Therapeutic Activity:  Self Care: Trigger Point Dry Needling:  Modalities:    PATIENT EDUCATION:  Education details: on current presentation, on HEP, on clinical outcomes score and POC, on rationale for interventions, importance for strengthening and exercises and 3 factors of balance Person educated: Patient Education method: Explanation, Demonstration, and Handouts Education comprehension: verbalized understanding   HOME EXERCISE PROGRAM: [id number]  ASSESSMENT:  CLINICAL IMPRESSION: Patient presents to physical therapy with multiple complaints including neck pain, bilateral shoulder pain, right hip pain, balance deficits and right lower extremity  pain and weakness.  Session focused on education and review of current presentation, assessment limited to lower extremity at this time but will assess and include neck and shoulders and a plan of care.  Frequency limited secondary to patient schedule and focus will be to establish good home exercise program to work on current deficits and improve overall function, reduce fall risk and improve quality of life.  OBJECTIVE IMPAIRMENTS: Abnormal gait, decreased activity tolerance, decreased balance, decreased endurance, decreased knowledge of use of DME, decreased mobility, difficulty walking, decreased ROM, decreased strength, impaired UE functional use, postural dysfunction, and pain.   ACTIVITY LIMITATIONS: sitting, standing, squatting, transfers, reach over head, and locomotion level  PARTICIPATION LIMITATIONS: community activity  PERSONAL FACTORS: Age, Fitness, Past/current experiences, Time since onset of injury/illness/exacerbation, and 3+ comorbidities: x4 right knee surgeries, chest radiation, cervical fusion C3-7,   are also affecting patient's functional outcome.   REHAB POTENTIAL: Good  CLINICAL DECISION MAKING: Evolving/moderate complexity  EVALUATION COMPLEXITY: Moderate   GOALS: Goals reviewed with patient? yes  SHORT TERM GOALS: Target date: 03/20/2022  Patient will be independent in self management strategies to improve quality of life and functional outcomes. Baseline: New Program Goal status: INITIAL  2.  Patient will report at least  25% improvement in overall hip symptoms and/or function to demonstrate improved functional mobility Baseline: 0% better Goal status: INITIAL  3.  Patient will be able to perform 5x STS in < 15 seconds to demonstrate improved functional strength. Baseline:  Goal status: INITIAL  4.  Patient will report being able to walk at least 10 minutes without severe pain with cane to improve functional mobility. Baseline:  Goal status:  INITIAL    LONG TERM GOALS: Target date: 05/01/2022   Patient will report at least 25% improvement in overall neck symptoms and/or function to demonstrate improved functional mobility Baseline: 0% better Goal status: INITIAL  2.  Patient will demonstrate at least 4- out of 5 lower extremity strength in seated position Goal status: INITIAL  3.  Patient will report performing daily exercises for neck shoulders and lower extremities to improve overall strength and mobility. Baseline:  Goal status: INITIAL     PLAN:  PT FREQUENCY: 1-2x/week  PT DURATION: 12 weeks  PLANNED INTERVENTIONS: Therapeutic exercises, Therapeutic activity, Neuromuscular re-education, Balance training, Gait training, Patient/Family education, Self Care, Joint mobilization, Joint manipulation, Stair training, Vestibular training, Canalith repositioning, Orthotic/Fit training, Dry Needling, Electrical stimulation, Spinal manipulation, Spinal mobilization, Cryotherapy, Moist heat, Ultrasound, Ionotophoresis '4mg'$ /ml Dexamethasone, Manual therapy, and Re-evaluation  PLAN FOR NEXT SESSION: assess neck and shoulder next session. Develop HEP   2:05 PM, 02/06/22 Jerene Pitch, DPT Physical Therapy with [facility]

## 2022-02-06 NOTE — Therapy (Deleted)
OUTPATIENT PHYSICAL THERAPY TREATMENT NOTE   Patient Name: Felicia [name] MRN: [id number] DOB:02-16-1950, 72 y.o., female Today's Date: 02/06/2022  PCP: Fredirick Lathe, PA  REFERRING PROVIDER: Gregor Hams, MD  END OF SESSION:    Past Medical History:  Diagnosis Date   Anemia    Anxiety    Breast cancer ([place]) 09/2019   left breast IMC   CAD (coronary artery disease)    a. 3/2007s/p DES to the LAD [facility]); b. 01/2017 MV: EF 80%, small, mild apical ant defect w/o ischemia (felt to be breast atten). Low risk.   Depression    DM (diabetes mellitus) ([place])    Essential hypertension    GERD (gastroesophageal reflux disease)    Headache    secondary to neck surgery per patient   High triglycerides    Hyperlipidemia    Hypothyroid    OA (osteoarthritis)    right knee,hands   Overflow incontinence    PONV (postoperative nausea and vomiting)    s/p gallbladder    RLS (restless legs syndrome)    Urinary urgency    Uterine cancer ([place]) dx'd 2014   Past Surgical History:  Procedure Laterality Date   BREAST LUMPECTOMY WITH RADIOACTIVE SEED AND SENTINEL LYMPH NODE BIOPSY Left 10/07/2019   Procedure: LEFT BREAST LUMPECTOMY WITH RADIOACTIVE SEED AND SENTINEL LYMPH NODE BIOPSY;  Surgeon: Rolm Bookbinder, MD;  Location: [place];  Service: General;  Laterality: Left;   CORONARY STENT PLACEMENT     CORONARY/GRAFT ACUTE MI REVASCULARIZATION     FINGER ARTHRODESIS Right 06/11/2019   Procedure: ARTHRODESIS INDEX FINGER DISTAL PHALANGEAL JOINT;  Surgeon: [name], MD;  Location: [place];  Service: Orthopedics;  Laterality: Right;   FOOT SURGERY Left    LEFT HEART CATH AND CORONARY ANGIOGRAPHY N/A 02/16/2018   Procedure: LEFT HEART CATH AND CORONARY ANGIOGRAPHY;  Surgeon: Lorretta Harp, MD;  Location: [place] [facility];  Service: Cardiovascular;  Laterality: N/A;   LEFT HEART CATH AND CORONARY ANGIOGRAPHY N/A 05/01/2020   Procedure:  LEFT HEART CATH AND CORONARY ANGIOGRAPHY;  Surgeon: Lorretta Harp, MD;  Location: [place] [facility];  Service: Cardiovascular;  Laterality: N/A;   PORTACATH PLACEMENT Right 11/23/2019   Procedure: INSERTION PORT-A-CATH WITH ULTRASOUND GUIDANCE;  Surgeon: Rolm Bookbinder, MD;  Location: [place];  Service: General;  Laterality: Right;   REPLACEMENT TOTAL KNEE Right    SPINE SURGERY     TOTAL ABDOMINAL HYSTERECTOMY     FOR UTERUS CANCER   TOTAL HIP ARTHROPLASTY Left 2015   Patient Active Problem List   Diagnosis Date Noted   Erosive osteoarthritis of multiple sites 09/26/2021   Frequent headaches 05/31/2021   Hoarse 01/25/2021   Hyperglycemia due to type 2 diabetes mellitus ([place]) 01/18/2021   Mixed simple and mucopurulent chronic bronchitis ([place]) 12/27/2020   Centrilobular emphysema ([place]) 07/31/2020   Shortness of breath 07/31/2020   Pressure injury of skin 04/30/2020   Hypothyroid    Anxiety    Anxiety and depression    Port-A-Cath in place 11/24/2019   Hepatic steatosis 09/28/2019   Malignant neoplasm of upper-outer quadrant of left breast in female, estrogen receptor positive ([place]) 09/02/2019   Trigger index finger of right hand 05/05/2019   Primary osteoarthritis of first carpometacarpal joint of right hand 05/05/2019   Endometrial cancer ([place]) 10/23/2018   Cirrhosis of liver without ascites ([place]) 10/23/2018   Diarrhea 10/23/2018   Chest pain 02/15/2018   Coronary artery disease  involving native coronary artery of native heart without angina pectoris 11/18/2017   Hyperlipidemia LDL goal <70 11/18/2017   Type 2 diabetes mellitus without complication, without long-term current use of insulin ([place]) 11/18/2017   Dizziness 11/18/2017     THERAPY DIAG:  No diagnosis found.   REFERRING DIAG: M70.61 (ICD-10-CM) - Trochanteric bursitis of right hip M54.2 (ICD-10-CM) - Neck pain   Rationale for Evaluation and Treatment: Rehabilitation  ONSET DATE:  years  SUBJECTIVE:   SUBJECTIVE STATEMENT: 02/06/2022 ***  Eval:States that she has had right hip pain for a while off and on. States she feels it is doing alright but it is "cooking" to be redone. States she has pain with sitting. States that sometimes her right leg collapses on her and this has happened for years and her right leg would give ou on her. States she had multiple knee surgeries and a replacement which didn't help. States that her pain wraps around from her right hip the front of her thigh and into her shin.   Reports she knows her legs are weak which is why she avoids stairs. States she used to walk 4 miles a day but then she had difficulties with her left hip and haven't walked a lot since. States she is now having difficulties getting in and out of a car and she has to pick up both legs to get in and out of her car. Reports anytime she has to put the fitted sheet on her bed she falls (onto her bed).   States her neck/shoulder pain is always there and she gets headaches from it. States she is going to a neurologist who gave her gabapentin for her shoulder pain. States she feels like massage helps a bit - she has one of the self release hooks to help. States she has to be careful with her pocketbook. States she has a torn tendon on her right shoulder.    States she veers right when she walks and her MD said her balance will always be the same. Reports she uses the cane but tries to walk without her cane.   PERTINENT HISTORY: Diabetes, history of breast cancer (LEFT), history of endometrial cancer (with history of radiation), spine surgery C3-C7 fusion 2017, heart disease, L THA 2015, history of 3 right knee scopes and right TKA, history of inner ear issue (required epley  maneuver), PAIN:  Are you having pain? Yes: NPRS scale: 8/10 Pain location: right read end Pain description: constant ache Aggravating factors: walking, stairs, sitting long periods of time  Relieving factors:  uses cane when in pain  Neck pain: stiff 5/10   Rib pain: -- after radiation for breast cancer - arthritis in her spine and ribs 4/10 - achy PRECAUTIONS: None  WEIGHT BEARING RESTRICTIONS: No  FALLS:  Has patient fallen in last 6 months? Yes. Number of falls 1  LIVING ENVIRONMENT: Lives with: lives alone Lives in: House/apartment Stairs: No Has following equipment at home: Single point cane, Environmental consultant - 2 wheeled, and Grab bars  OCCUPATION: not working  PLOF: Independent with household mobility with device and Independent with community mobility with device  PATIENT GOALS: to be able to walk further- currently working 5 minutes with a cane with ease, to have less pain      OBJECTIVE:   DIAGNOSTIC FINDINGS:  FINDINGS: Pelvic ring is intact. Left hip replacement is noted in satisfactory position. Mild degenerative changes of the right hip are seen. No acute fracture or dislocation is noted.  No soft tissue abnormality is seen.   IMPRESSION: No acute abnormality noted.   COGNITION: Overall cognitive status: Within functional limits for tasks assessed       EDEMA:  None   POSTURE: rounded shoulders, forward head, increased thoracic kyphosis, and posterior pelvic tilt  PALPATION: Next session    LE Measurements Lower Extremity Right 02/06/2022 Left 02/06/2022   A/PROM MMT A/PROM MMT  Hip Flexion  4-  3+  Hip Extension      Hip Abduction (seated)  3+  3+  Hip Adduction (seated)  4-  4-  Hip Internal rotation      Hip External rotation      Knee Flexion  3+  4-  Knee Extension  3  4  Ankle Dorsiflexion      Ankle Plantarflexion      Ankle Inversion      Ankle Eversion       (Blank rows = not tested) * pain    UE Measurements Upper Extremity Right 02/06/2022 Left 02/06/2022   A/PROM MMT A/PROM MMT  Shoulder Flexion      Shoulder Extension      Shoulder Abduction      Shoulder Adduction      Shoulder Internal Rotation      Shoulder External  Rotation      Elbow Flexion      Elbow Extension      Wrist Flexion      Wrist Extension      Wrist Supination      Wrist Pronation      Wrist Ulnar Deviation      Wrist Radial Deviation      Grip Strength NA  NA     (Blank rows = not tested)   * pain  LOWER EXTREMITY SPECIAL TESTS:  Slump test - neg B  FUNCTIONAL TESTS:   5x STS 23.64 seconds no UE use but fearful of falling - increased hip pain  GAIT: Distance walked: 275 feet  Assistive device utilized: Single point cane Level of assistance: Modified independence Comments: limited lumbar and hip ROM- veers right - 2MW test   TODAY'S TREATMENT:                                                                                                                              DATE:   02/06/2022   Therapeutic Exercise:  Aerobic: Supine: Prone:  Seated: piriformis stretch x3 30" holds  Standing: Neuromuscular Re-education: Manual Therapy: Therapeutic Activity: Self Care: Trigger Point Dry Needling:  Modalities:    PATIENT EDUCATION:  Education details: on current presentation, on HEP, on clinical outcomes score and POC, on rationale for interventions, importance for strengthening and exercises and 3 factors of balance Person educated: Patient Education method: Explanation, Demonstration, and Handouts Education comprehension: verbalized understanding   HOME EXERCISE PROGRAM: [id number]  ASSESSMENT:  CLINICAL IMPRESSION: 02/06/2022  Eval:Patient presents to physical therapy with multiple complaints including neck pain, bilateral  shoulder pain, right hip pain, balance deficits and right lower extremity pain and weakness.  Session focused on education and review of current presentation, assessment limited to lower extremity at this time but will assess and include neck and shoulders and a plan of care.  Frequency limited secondary to patient schedule and focus will be to establish good home exercise program to work on  current deficits and improve overall function, reduce fall risk and improve quality of life.  OBJECTIVE IMPAIRMENTS: Abnormal gait, decreased activity tolerance, decreased balance, decreased endurance, decreased knowledge of use of DME, decreased mobility, difficulty walking, decreased ROM, decreased strength, impaired UE functional use, postural dysfunction, and pain.   ACTIVITY LIMITATIONS: sitting, standing, squatting, transfers, reach over head, and locomotion level  PARTICIPATION LIMITATIONS: community activity  PERSONAL FACTORS: Age, Fitness, Past/current experiences, Time since onset of injury/illness/exacerbation, and 3+ comorbidities: x4 right knee surgeries, chest radiation, cervical fusion C3-7,  are also affecting patient's functional outcome.   REHAB POTENTIAL: Good  CLINICAL DECISION MAKING: Evolving/moderate complexity  EVALUATION COMPLEXITY: Moderate   GOALS: Goals reviewed with patient? yes  SHORT TERM GOALS: Target date: 03/20/2022  Patient will be independent in self management strategies to improve quality of life and functional outcomes. Baseline: New Program Goal status: INITIAL  2.  Patient will report at least  25% improvement in overall hip symptoms and/or function to demonstrate improved functional mobility Baseline: 0% better Goal status: INITIAL  3.  Patient will be able to perform 5x STS in < 15 seconds to demonstrate improved functional strength. Baseline:  Goal status: INITIAL  4.  Patient will report being able to walk at least 10 minutes without severe pain with cane to improve functional mobility. Baseline:  Goal status: INITIAL    LONG TERM GOALS: Target date: 05/01/2022   Patient will report at least 25% improvement in overall neck symptoms and/or function to demonstrate improved functional mobility Baseline: 0% better Goal status: INITIAL  2.  Patient will demonstrate at least 4- out of 5 lower extremity strength in seated position Goal  status: INITIAL  3.  Patient will report performing daily exercises for neck shoulders and lower extremities to improve overall strength and mobility. Baseline:  Goal status: INITIAL     PLAN:  PT FREQUENCY: 1-2x/week  PT DURATION: 12 weeks  PLANNED INTERVENTIONS: Therapeutic exercises, Therapeutic activity, Neuromuscular re-education, Balance training, Gait training, Patient/Family education, Self Care, Joint mobilization, Joint manipulation, Stair training, Vestibular training, Canalith repositioning, Orthotic/Fit training, Dry Needling, Electrical stimulation, Spinal manipulation, Spinal mobilization, Cryotherapy, Moist heat, Ultrasound, Ionotophoresis '4mg'$ /ml Dexamethasone, Manual therapy, and Re-evaluation  PLAN FOR NEXT SESSION: assess neck and shoulder next session. Develop HEP, review app for medbridge   2:07 PM, 02/06/22 Jerene Pitch, DPT Physical Therapy with [facility]

## 2022-02-14 ENCOUNTER — Encounter: Payer: Medicare HMO | Admitting: Physical Therapy

## 2022-02-15 ENCOUNTER — Other Ambulatory Visit: Payer: Self-pay | Admitting: Physician Assistant

## 2022-02-15 DIAGNOSIS — E119 Type 2 diabetes mellitus without complications: Secondary | ICD-10-CM

## 2022-02-20 NOTE — Therapy (Unsigned)
OUTPATIENT PHYSICAL THERAPY TREATMENT NOTE   Patient Name: Felicia [name] MRN: [id number] DOB:[birth year], 72 y.o., female Today's Date: 02/21/2022  PCP: Fredirick Lathe, PA  REFERRING PROVIDER: Gregor Hams, MD  END OF SESSION:   PT End of Session - 02/21/22 1303     Visit Number 2    Number of Visits 12    Date for PT Re-Evaluation 05/01/22    Authorization Type [employer] Rossetti auth requested    PT Start Time 1305    PT Stop Time 1343    PT Time Calculation (min) 38 min    Activity Tolerance Patient tolerated treatment well             Past Medical History:  Diagnosis Date   Anemia    Anxiety    Breast cancer ([place]) 09/2019   left breast IMC   CAD (coronary artery disease)    a. 3/2007s/p DES to the LAD [facility]); b. 01/2017 MV: EF 80%, small, mild apical ant defect w/o ischemia (felt to be breast atten). Low risk.   Depression    DM (diabetes mellitus) ([place])    Essential hypertension    GERD (gastroesophageal reflux disease)    Headache    secondary to neck surgery per patient   High triglycerides    Hyperlipidemia    Hypothyroid    OA (osteoarthritis)    right knee,hands   Overflow incontinence    PONV (postoperative nausea and vomiting)    s/p gallbladder    RLS (restless legs syndrome)    Urinary urgency    Uterine cancer ([place]) dx'd 2014   Past Surgical History:  Procedure Laterality Date   BREAST LUMPECTOMY WITH RADIOACTIVE SEED AND SENTINEL LYMPH NODE BIOPSY Left 10/07/2019   Procedure: LEFT BREAST LUMPECTOMY WITH RADIOACTIVE SEED AND SENTINEL LYMPH NODE BIOPSY;  Surgeon: Rolm Bookbinder, MD;  Location: [place];  Service: General;  Laterality: Left;   CORONARY STENT PLACEMENT     CORONARY/GRAFT ACUTE MI REVASCULARIZATION     FINGER ARTHRODESIS Right 06/11/2019   Procedure: ARTHRODESIS INDEX FINGER DISTAL PHALANGEAL JOINT;  Surgeon: [name], MD;  Location: [place];  Service: Orthopedics;  Laterality: Right;    FOOT SURGERY Left    LEFT HEART CATH AND CORONARY ANGIOGRAPHY N/A 02/16/2018   Procedure: LEFT HEART CATH AND CORONARY ANGIOGRAPHY;  Surgeon: Lorretta Harp, MD;  Location: [place] CV LAB;  Service: Cardiovascular;  Laterality: N/A;   LEFT HEART CATH AND CORONARY ANGIOGRAPHY N/A 05/01/2020   Procedure: LEFT HEART CATH AND CORONARY ANGIOGRAPHY;  Surgeon: Lorretta Harp, MD;  Location: [place] CV [facility];  Service: Cardiovascular;  Laterality: N/A;   PORTACATH PLACEMENT Right 11/23/2019   Procedure: INSERTION PORT-A-CATH WITH ULTRASOUND GUIDANCE;  Surgeon: Rolm Bookbinder, MD;  Location: [place];  Service: General;  Laterality: Right;   REPLACEMENT TOTAL KNEE Right    SPINE SURGERY     TOTAL ABDOMINAL HYSTERECTOMY     FOR UTERUS CANCER   TOTAL HIP ARTHROPLASTY Left 2015   Patient Active Problem List   Diagnosis Date Noted   Erosive osteoarthritis of multiple sites 09/26/2021   Frequent headaches 05/31/2021   Hoarse 01/25/2021   Hyperglycemia due to type 2 diabetes mellitus ([place]) 01/18/2021   Mixed simple and mucopurulent chronic bronchitis ([place]) 12/27/2020   Centrilobular emphysema ([place]) 07/31/2020   Shortness of breath 07/31/2020   Pressure injury of skin 04/30/2020   Hypothyroid    Anxiety    Anxiety and  depression    Port-A-Cath in place 11/24/2019   Hepatic steatosis 09/28/2019   Malignant neoplasm of upper-outer quadrant of left breast in female, estrogen receptor positive ([place]) 09/02/2019   Trigger index finger of right hand 05/05/2019   Primary osteoarthritis of first carpometacarpal joint of right hand 05/05/2019   Endometrial cancer ([place]) 10/23/2018   Cirrhosis of liver without ascites ([place]) 10/23/2018   Diarrhea 10/23/2018   Chest pain 02/15/2018   Coronary artery disease involving native coronary artery of native heart without angina pectoris 11/18/2017   Hyperlipidemia LDL goal <70 11/18/2017   Type 2 diabetes mellitus without  complication, without long-term current use of insulin ([place]) 11/18/2017   Dizziness 11/18/2017     THERAPY DIAG:  Pain in right hip  Muscle weakness (generalized)  Neck pain   REFERRING DIAG: M70.61 (ICD-10-CM) - Trochanteric bursitis of right hip M54.2 (ICD-10-CM) - Neck pain   Rationale for Evaluation and Treatment: Rehabilitation  ONSET DATE: years  SUBJECTIVE:   SUBJECTIVE STATEMENT: 02/21/2022 States her pain in her legs on Monday/Tuesday she was have 10/10 pain and could barely walk today It is better 8/10. States she has a headache and always has headaches and currently it is 6/10 and in her neck/shoulders.   Eval:States that she has had right hip pain for a while off and on. States she feels it is doing alright but it is "cooking" to be redone. States she has pain with sitting. States that sometimes her right leg collapses on her and this has happened for years and her right leg would give ou on her. States she had multiple knee surgeries and a replacement which didn't help. States that her pain wraps around from her right hip the front of her thigh and into her shin.   Reports she knows her legs are weak which is why she avoids stairs. States she used to walk 4 miles a day but then she had difficulties with her left hip and haven't walked a lot since. States she is now having difficulties getting in and out of a car and she has to pick up both legs to get in and out of her car. Reports anytime she has to put the fitted sheet on her bed she falls (onto her bed).   States her neck/shoulder pain is always there and she gets headaches from it. States she is going to a neurologist who gave her gabapentin for her shoulder pain. States she feels like massage helps a bit - she has one of the self release hooks to help. States she has to be careful with her pocketbook. States she has a torn tendon on her right shoulder.    States she veers right when she walks and her MD said her  balance will always be the same. Reports she uses the cane but tries to walk without her cane.   PERTINENT HISTORY: Diabetes, history of breast cancer (LEFT), history of endometrial cancer (with history of radiation), spine surgery C3-C7 fusion 2017, heart disease, L THA 2015, history of 3 right knee scopes and right TKA, history of inner ear issue (required epley  maneuver), PAIN:  Are you having pain? Yes: NPRS scale: 8/10 Pain location: right read end Pain description: constant ache Aggravating factors: walking, stairs, sitting long periods of time  Relieving factors: uses cane when in pain  Neck pain: stiff 5/10   Rib pain: -- after radiation for breast cancer - arthritis in her spine and ribs 4/10 - achy PRECAUTIONS: None  WEIGHT BEARING RESTRICTIONS: No  FALLS:  Has patient fallen in last 6 months? Yes. Number of falls 1  LIVING ENVIRONMENT: Lives with: lives alone Lives in: House/apartment Stairs: No Has following equipment at home: Single point cane, Environmental consultant - 2 wheeled, and Grab bars  OCCUPATION: not working  PLOF: Independent with household mobility with device and Independent with community mobility with device  PATIENT GOALS: to be able to walk further- currently working 5 minutes with a cane with ease, to have less pain      OBJECTIVE:   DIAGNOSTIC FINDINGS:  FINDINGS: Pelvic ring is intact. Left hip replacement is noted in satisfactory position. Mild degenerative changes of the right hip are seen. No acute fracture or dislocation is noted. No soft tissue abnormality is seen.   IMPRESSION: No acute abnormality noted.   COGNITION: Overall cognitive status: Within functional limits for tasks assessed       EDEMA:  None   POSTURE: rounded shoulders, forward head, increased thoracic kyphosis, and posterior pelvic tilt  PALPATION: Next session    LE Measurements Lower Extremity Right 02/06/2022 Left 02/06/2022   A/PROM MMT A/PROM MMT  Hip  Flexion  4-  3+  Hip Extension      Hip Abduction (seated)  3+  3+  Hip Adduction (seated)  4-  4-  Hip Internal rotation      Hip External rotation      Knee Flexion  3+  4-  Knee Extension  3  4  Ankle Dorsiflexion      Ankle Plantarflexion      Ankle Inversion      Ankle Eversion       (Blank rows = not tested) * pain   Neck  AROM:    02/21/2022      Flexion  20**     Extension 15*(compensates with lumbar extension)      R ROT  40**     L ROT  40**     R SB  10*    L SB 5*     * Pain   (Blank rows = not tested)    UE Measurements Upper Extremity Right 02/21/22 Left 02/21/22   A/PROM MMT A/PROM MMT  Shoulder Flexion 160 3+* 140* 3+*  Shoulder Extension      Shoulder Abduction WFL* 3+* 140* 3+  Shoulder Adduction      Shoulder Internal Rotation Refuses to reach behind back due to pain 3+ Refuses to reach behind back due to pain  3+  Shoulder External Rotation Reaches to T2 SP* 3+ Reaches to C7 SP* 3+  Elbow Flexion      Elbow Extension      Wrist Flexion      Wrist Extension      Wrist Supination      Wrist Pronation      Wrist Ulnar Deviation      Wrist Radial Deviation      Grip Strength NA  NA     (Blank rows = not tested)   * pain  LOWER EXTREMITY SPECIAL TESTS:  Slump test - neg B  FUNCTIONAL TESTS:   5x STS 23.64 seconds no UE use but fearful of falling - increased hip pain  GAIT: Distance walked: 275 feet  Assistive device utilized: Single point cane Level of assistance: Modified independence Comments: limited lumbar and hip ROM- veers right - 2MW test   TODAY'S TREATMENT:  DATE:   02/21/2022 Therapeutic Exercise:  Aerobic: Supine: Prone:  Seated: self mobilizations to legs/calfs and shoulders - 25 minutes  Standing: Neuromuscular Re-education: Manual Therapy: Therapeutic Activity: Self Care: Trigger Point Dry Needling:   Modalities:    PATIENT EDUCATION:  Education details: on presentation of shoulders and HEP Person educated: Patient Education method: Explanation, Demonstration, and Handouts Education comprehension: verbalized understanding   HOME EXERCISE PROGRAM: [id number]  ASSESSMENT:  CLINICAL IMPRESSION: 02/21/2022 Session focused on shoulder and neck assessment and education on safe use of percussion gun for muscle pain. Tolerated this very well reporting reduced pain in shoulders and legs end of session. Will continue with current POC as tolerated.   Eval:Patient presents to physical therapy with multiple complaints including neck pain, bilateral shoulder pain, right hip pain, balance deficits and right lower extremity pain and weakness.  Session focused on education and review of current presentation, assessment limited to lower extremity at this time but will assess and include neck and shoulders and a plan of care.  Frequency limited secondary to patient schedule and focus will be to establish good home exercise program to work on current deficits and improve overall function, reduce fall risk and improve quality of life.  OBJECTIVE IMPAIRMENTS: Abnormal gait, decreased activity tolerance, decreased balance, decreased endurance, decreased knowledge of use of DME, decreased mobility, difficulty walking, decreased ROM, decreased strength, impaired UE functional use, postural dysfunction, and pain.   ACTIVITY LIMITATIONS: sitting, standing, squatting, transfers, reach over head, and locomotion level  PARTICIPATION LIMITATIONS: community activity  PERSONAL FACTORS: Age, Fitness, Past/current experiences, Time since onset of injury/illness/exacerbation, and 3+ comorbidities: x4 right knee surgeries, chest radiation, cervical fusion C3-7,  are also affecting patient's functional outcome.   REHAB POTENTIAL: Good  CLINICAL DECISION MAKING: Evolving/moderate complexity  EVALUATION COMPLEXITY:  Moderate   GOALS: Goals reviewed with patient? yes  SHORT TERM GOALS: Target date: 03/20/2022  Patient will be independent in self management strategies to improve quality of life and functional outcomes. Baseline: New Program Goal status: INITIAL  2.  Patient will report at least  25% improvement in overall hip symptoms and/or function to demonstrate improved functional mobility Baseline: 0% better Goal status: INITIAL  3.  Patient will be able to perform 5x STS in < 15 seconds to demonstrate improved functional strength. Baseline:  Goal status: INITIAL  4.  Patient will report being able to walk at least 10 minutes without severe pain with cane to improve functional mobility. Baseline:  Goal status: INITIAL    LONG TERM GOALS: Target date: 05/01/2022   Patient will report at least 25% improvement in overall neck symptoms and/or function to demonstrate improved functional mobility Baseline: 0% better Goal status: INITIAL  2.  Patient will demonstrate at least 4- out of 5 lower extremity strength in seated position Goal status: INITIAL  3.  Patient will report performing daily exercises for neck shoulders and lower extremities to improve overall strength and mobility. Baseline:  Goal status: INITIAL     PLAN:  PT FREQUENCY: 1-2x/week  PT DURATION: 12 weeks  PLANNED INTERVENTIONS: Therapeutic exercises, Therapeutic activity, Neuromuscular re-education, Balance training, Gait training, Patient/Family education, Self Care, Joint mobilization, Joint manipulation, Stair training, Vestibular training, Canalith repositioning, Orthotic/Fit training, Dry Needling, Electrical stimulation, Spinal manipulation, Spinal mobilization, Cryotherapy, Moist heat, Ultrasound, Ionotophoresis '4mg'$ /ml Dexamethasone, Manual therapy, and Re-evaluation  PLAN FOR NEXT SESSION: assess neck and shoulder next session. Develop HEP, review app for medbridge   1:45 PM, 02/21/22 Jerene Pitch,  DPT Physical Therapy  with [place] [facility]

## 2022-02-21 ENCOUNTER — Encounter: Payer: Self-pay | Admitting: Physical Therapy

## 2022-02-21 ENCOUNTER — Ambulatory Visit: Payer: Medicare HMO | Admitting: Physical Therapy

## 2022-02-21 DIAGNOSIS — M542 Cervicalgia: Secondary | ICD-10-CM | POA: Diagnosis not present

## 2022-02-21 DIAGNOSIS — M6281 Muscle weakness (generalized): Secondary | ICD-10-CM

## 2022-02-21 DIAGNOSIS — M25551 Pain in right hip: Secondary | ICD-10-CM

## 2022-02-21 DIAGNOSIS — R6889 Other general symptoms and signs: Secondary | ICD-10-CM | POA: Diagnosis not present

## 2022-02-28 ENCOUNTER — Encounter: Payer: Self-pay | Admitting: Physical Therapy

## 2022-02-28 ENCOUNTER — Ambulatory Visit: Payer: Medicare HMO | Admitting: Physical Therapy

## 2022-02-28 ENCOUNTER — Encounter: Payer: Self-pay | Admitting: Physician Assistant

## 2022-02-28 ENCOUNTER — Ambulatory Visit (INDEPENDENT_AMBULATORY_CARE_PROVIDER_SITE_OTHER): Payer: Medicare HMO | Admitting: Physician Assistant

## 2022-02-28 VITALS — BP 110/64 | HR 75 | Temp 97.3°F | Ht 64.0 in | Wt 170.4 lb

## 2022-02-28 DIAGNOSIS — M542 Cervicalgia: Secondary | ICD-10-CM | POA: Diagnosis not present

## 2022-02-28 DIAGNOSIS — M25551 Pain in right hip: Secondary | ICD-10-CM | POA: Diagnosis not present

## 2022-02-28 DIAGNOSIS — R21 Rash and other nonspecific skin eruption: Secondary | ICD-10-CM

## 2022-02-28 DIAGNOSIS — R6889 Other general symptoms and signs: Secondary | ICD-10-CM | POA: Diagnosis not present

## 2022-02-28 DIAGNOSIS — M6281 Muscle weakness (generalized): Secondary | ICD-10-CM

## 2022-02-28 MED ORDER — TRIAMCINOLONE ACETONIDE 0.1 % EX CREA: 1.0000 | TOPICAL_CREAM | Freq: Two times a day (BID) | CUTANEOUS | 0 refills | Status: DC

## 2022-02-28 NOTE — Progress Notes (Signed)
Subjective:    Patient ID: Felicia [name], female    DOB: [birth year], 72 y.o.   MRN: [id number]  Chief Complaint  Patient presents with   Rash    HPI Patient is in today for rash. Side of R upper thigh, now starting on L upper thigh. Not itching or hurting, but spreading. Ran out of soap and started on a different one, wonders if this is the cause. No other symptoms or concerns.   Past Medical History:  Diagnosis Date   Anemia    Anxiety    Breast cancer ([place]) 09/2019   left breast IMC   CAD (coronary artery disease)    a. 3/2007s/p DES to the LAD [facility]); b. 01/2017 MV: EF 80%, small, mild apical ant defect w/o ischemia (felt to be breast atten). Low risk.   Depression    DM (diabetes mellitus) ([place])    Essential hypertension    GERD (gastroesophageal reflux disease)    Headache    secondary to neck surgery per patient   High triglycerides    Hyperlipidemia    Hypothyroid    OA (osteoarthritis)    right knee,hands   Overflow incontinence    PONV (postoperative nausea and vomiting)    s/p gallbladder    RLS (restless legs syndrome)    Urinary urgency    Uterine cancer ([place]) dx'd 2014    Past Surgical History:  Procedure Laterality Date   BREAST LUMPECTOMY WITH RADIOACTIVE SEED AND SENTINEL LYMPH NODE BIOPSY Left 10/07/2019   Procedure: LEFT BREAST LUMPECTOMY WITH RADIOACTIVE SEED AND SENTINEL LYMPH NODE BIOPSY;  Surgeon: Rolm Bookbinder, MD;  Location: [place];  Service: General;  Laterality: Left;   CORONARY STENT PLACEMENT     CORONARY/GRAFT ACUTE MI REVASCULARIZATION     FINGER ARTHRODESIS Right 06/11/2019   Procedure: ARTHRODESIS INDEX FINGER DISTAL PHALANGEAL JOINT;  Surgeon: [name], MD;  Location: [place];  Service: Orthopedics;  Laterality: Right;   FOOT SURGERY Left    LEFT HEART CATH AND CORONARY ANGIOGRAPHY N/A 02/16/2018   Procedure: LEFT HEART CATH AND CORONARY ANGIOGRAPHY;  Surgeon: Lorretta Harp, MD;  Location: [place] CV LAB;  Service: Cardiovascular;  Laterality: N/A;   LEFT HEART CATH AND CORONARY ANGIOGRAPHY N/A 05/01/2020   Procedure: LEFT HEART CATH AND CORONARY ANGIOGRAPHY;  Surgeon: Lorretta Harp, MD;  Location: [place] [facility];  Service: Cardiovascular;  Laterality: N/A;   PORTACATH PLACEMENT Right 11/23/2019   Procedure: INSERTION PORT-A-CATH WITH ULTRASOUND GUIDANCE;  Surgeon: Rolm Bookbinder, MD;  Location: [place];  Service: General;  Laterality: Right;   REPLACEMENT TOTAL KNEE Right    SPINE SURGERY     TOTAL ABDOMINAL HYSTERECTOMY     FOR UTERUS CANCER   TOTAL HIP ARTHROPLASTY Left 2015    Family History  Problem Relation Age of Onset   AAA (abdominal aortic aneurysm) Mother    Diabetes Father    Alzheimer's disease Father    Throat cancer Sister    Breast cancer Cousin     Social History   Tobacco Use   Smoking status: Never   Smokeless tobacco: Never  Vaping Use   Vaping Use: Never used  Substance Use Topics   Alcohol use: Yes    Comment: OCCASIONALLY   Drug use: No     Allergies  Allergen Reactions   Chloraprep One Step [Chlorhexidine Gluconate] Rash   Estrogens Other (See Comments)    PATIENT HAS A HISTORY  OF CANCER AND HAS BEEN TOLD TO NEVER TAKE ANYTHING CONTAINING ESTROGEN, AS IT MIGHT CAUSE A RECURRENCE   Shrimp [Shellfish Allergy] Nausea And Vomiting    Review of Systems NEGATIVE UNLESS OTHERWISE INDICATED IN HPI      Objective:     BP 110/64   Pulse 75   Temp (!) 97.3 F (36.3 C) (Temporal)   Ht 5' 4"$  (1.626 m)   Wt 170 lb 6.4 oz (77.3 kg)   SpO2 97%   BMI 29.25 kg/m   Wt Readings from Last 3 Encounters:  02/28/22 170 lb 6.4 oz (77.3 kg)  01/29/22 169 lb (76.7 kg)  01/21/22 167 lb 4 oz (75.9 kg)    BP Readings from Last 3 Encounters:  02/28/22 110/64  01/29/22 138/74  12/24/21 130/76     Physical Exam Vitals and nursing note reviewed.  Constitutional:      Appearance: Normal  appearance.  Cardiovascular:     Rate and Rhythm: Normal rate and regular rhythm.  Pulmonary:     Effort: Pulmonary effort is normal.     Breath sounds: Normal breath sounds.  Skin:    Findings: Rash (upper anterior thighs several scattered small slightly dry / erythematous lesions) present.  Neurological:     Mental Status: She is alert.  Psychiatric:        Mood and Affect: Mood normal.        Behavior: Behavior normal.        Assessment & Plan:  Rash and nonspecific skin eruption  Other orders -     Triamcinolone Acetonide; Apply 1 Application topically 2 (two) times daily. For 14 days maximum  Dispense: 80 g; Refill: 0   Likely irritation from new soap, will start on Triamcinolone cream as directed. Discontinue use of this soap. Keep moisturized, monitor closely.      Return if symptoms worsen or fail to improve.  This note was prepared with assistance of Systems analyst. Occasional wrong-word or sound-a-like substitutions may have occurred due to the inherent limitations of voice recognition software.     Marissah Vandemark M Marqus Macphee, PA-C

## 2022-02-28 NOTE — Therapy (Signed)
OUTPATIENT PHYSICAL THERAPY TREATMENT NOTE   Patient Name: Felicia [name] MRN: [id number] DOB:Sep 13, 1950, 72 y.o., female Today's Date: 02/28/2022  PCP: Fredirick Lathe, PA  REFERRING PROVIDER: Gregor Hams, MD  END OF SESSION:   PT End of Session - 02/28/22 1303     Visit Number 3    Number of Visits 12    Date for PT Re-Evaluation 05/01/22    Authorization Type [employer] auth requested    PT Start Time 1301    PT Stop Time 1339    PT Time Calculation (min) 38 min    Activity Tolerance Patient tolerated treatment well             Past Medical History:  Diagnosis Date   Anemia    Anxiety    Breast cancer ([place]) 09/2019   left breast IMC   CAD (coronary artery disease)    a. 3/2007s/p DES to the LAD [facility]); b. 01/2017 MV: EF 80%, small, mild apical ant defect w/o ischemia (felt to be breast atten). Low risk.   Depression    DM (diabetes mellitus) ([place])    Essential hypertension    GERD (gastroesophageal reflux disease)    Headache    secondary to neck surgery per patient   High triglycerides    Hyperlipidemia    Hypothyroid    OA (osteoarthritis)    right knee,hands   Overflow incontinence    PONV (postoperative nausea and vomiting)    s/p gallbladder    RLS (restless legs syndrome)    Urinary urgency    Uterine cancer ([place]) dx'd 2014   Past Surgical History:  Procedure Laterality Date   BREAST LUMPECTOMY WITH RADIOACTIVE SEED AND SENTINEL LYMPH NODE BIOPSY Left 10/07/2019   Procedure: LEFT BREAST LUMPECTOMY WITH RADIOACTIVE SEED AND SENTINEL LYMPH NODE BIOPSY;  Surgeon: Rolm Bookbinder, MD;  Location: [place];  Service: General;  Laterality: Left;   CORONARY STENT PLACEMENT     CORONARY/GRAFT ACUTE MI REVASCULARIZATION     FINGER ARTHRODESIS Right 06/11/2019   Procedure: ARTHRODESIS INDEX FINGER DISTAL PHALANGEAL JOINT;  Surgeon: [name], MD;  Location: [place];  Service: Orthopedics;  Laterality: Right;    FOOT SURGERY Left    LEFT HEART CATH AND CORONARY ANGIOGRAPHY N/A 02/16/2018   Procedure: LEFT HEART CATH AND CORONARY ANGIOGRAPHY;  Surgeon: Lorretta Harp, MD;  Location: [place] [facility];  Service: Cardiovascular;  Laterality: N/A;   LEFT HEART CATH AND CORONARY ANGIOGRAPHY N/A 05/01/2020   Procedure: LEFT HEART CATH AND CORONARY ANGIOGRAPHY;  Surgeon: Lorretta Harp, MD;  Location: [place] [facility];  Service: Cardiovascular;  Laterality: N/A;   PORTACATH PLACEMENT Right 11/23/2019   Procedure: INSERTION PORT-A-CATH WITH ULTRASOUND GUIDANCE;  Surgeon: Rolm Bookbinder, MD;  Location: [place];  Service: General;  Laterality: Right;   REPLACEMENT TOTAL KNEE Right    SPINE SURGERY     TOTAL ABDOMINAL HYSTERECTOMY     FOR UTERUS CANCER   TOTAL HIP ARTHROPLASTY Left 2015   Patient Active Problem List   Diagnosis Date Noted   Erosive osteoarthritis of multiple sites 09/26/2021   Frequent headaches 05/31/2021   Hoarse 01/25/2021   Hyperglycemia due to type 2 diabetes mellitus ([place]) 01/18/2021   Mixed simple and mucopurulent chronic bronchitis ([place]) 12/27/2020   Centrilobular emphysema ([place]) 07/31/2020   Shortness of breath 07/31/2020   Pressure injury of skin 04/30/2020   Hypothyroid    Anxiety    Anxiety and  depression    Port-A-Cath in place 11/24/2019   Hepatic steatosis 09/28/2019   Malignant neoplasm of upper-outer quadrant of left breast in female, estrogen receptor positive ([place]) 09/02/2019   Trigger index finger of right hand 05/05/2019   Primary osteoarthritis of first carpometacarpal joint of right hand 05/05/2019   Endometrial cancer ([place]) 10/23/2018   Cirrhosis of liver without ascites ([place]) 10/23/2018   Diarrhea 10/23/2018   Chest pain 02/15/2018   Coronary artery disease involving native coronary artery of native heart without angina pectoris 11/18/2017   Hyperlipidemia LDL goal <70 11/18/2017   Type 2 diabetes mellitus without  complication, without long-term current use of insulin ([place]) 11/18/2017   Dizziness 11/18/2017     THERAPY DIAG:  Pain in right hip  Muscle weakness (generalized)  Neck pain   REFERRING DIAG: M70.61 (ICD-10-CM) - Trochanteric bursitis of right hip M54.2 (ICD-10-CM) - Neck pain   Rationale for Evaluation and Treatment: Rehabilitation  ONSET DATE: years  SUBJECTIVE:   SUBJECTIVE STATEMENT: 02/28/2022 States her neck is hurting her legs are ok but she has been waiting on her apt in th lobby so she is stiff. States she has difficulties sleeping.  Eval:States that she has had right hip pain for a while off and on. States she feels it is doing alright but it is "cooking" to be redone. States she has pain with sitting. States that sometimes her right leg collapses on her and this has happened for years and her right leg would give ou on her. States she had multiple knee surgeries and a replacement which didn't help. States that her pain wraps around from her right hip the front of her thigh and into her shin.   Reports she knows her legs are weak which is why she avoids stairs. States she used to walk 4 miles a day but then she had difficulties with her left hip and haven't walked a lot since. States she is now having difficulties getting in and out of a car and she has to pick up both legs to get in and out of her car. Reports anytime she has to put the fitted sheet on her bed she falls (onto her bed).   States her neck/shoulder pain is always there and she gets headaches from it. States she is going to a neurologist who gave her gabapentin for her shoulder pain. States she feels like massage helps a bit - she has one of the self release hooks to help. States she has to be careful with her pocketbook. States she has a torn tendon on her right shoulder.    States she veers right when she walks and her MD said her balance will always be the same. Reports she uses the cane but tries to walk  without her cane.   PERTINENT HISTORY: Diabetes, history of breast cancer (LEFT), history of endometrial cancer (with history of radiation), spine surgery C3-C7 fusion 2017, heart disease, L THA 2015, history of 3 right knee scopes and right TKA, history of inner ear issue (required epley  maneuver), PAIN:  Are you having pain? Yes: NPRS scale: 6/10 Pain location all over Pain description: constant ache Aggravating factors: walking, stairs, sitting long periods of time  Relieving factors: uses cane when in pain  Neck pain: stiff 5/10   Rib pain: -- after radiation for breast cancer - arthritis in her spine and ribs 4/10 - achy PRECAUTIONS: None  WEIGHT BEARING RESTRICTIONS: No  FALLS:  Has patient fallen in last  6 months? Yes. Number of falls 1  LIVING ENVIRONMENT: Lives with: lives alone Lives in: House/apartment Stairs: No Has following equipment at home: Single point cane, Environmental consultant - 2 wheeled, and Grab bars  OCCUPATION: not working  PLOF: Independent with household mobility with device and Independent with community mobility with device  PATIENT GOALS: to be able to walk further- currently working 5 minutes with a cane with ease, to have less pain      OBJECTIVE:   DIAGNOSTIC FINDINGS:  FINDINGS: Pelvic ring is intact. Left hip replacement is noted in satisfactory position. Mild degenerative changes of the right hip are seen. No acute fracture or dislocation is noted. No soft tissue abnormality is seen.   IMPRESSION: No acute abnormality noted.   COGNITION: Overall cognitive status: Within functional limits for tasks assessed       EDEMA:  None   POSTURE: rounded shoulders, forward head, increased thoracic kyphosis, and posterior pelvic tilt  PALPATION: Next session    LE Measurements Lower Extremity Right 02/06/2022 Left 02/06/2022   A/PROM MMT A/PROM MMT  Hip Flexion  4-  3+  Hip Extension      Hip Abduction (seated)  3+  3+  Hip Adduction  (seated)  4-  4-  Hip Internal rotation      Hip External rotation      Knee Flexion  3+  4-  Knee Extension  3  4  Ankle Dorsiflexion      Ankle Plantarflexion      Ankle Inversion      Ankle Eversion       (Blank rows = not tested) * pain   Neck  AROM:    02/28/2022      Flexion  20**     Extension 15*(compensates with lumbar extension)      R ROT  40**     L ROT  40**     R SB  10*    L SB 5*     * Pain   (Blank rows = not tested)    UE Measurements Upper Extremity Right 02/21/22 Left 02/21/22   A/PROM MMT A/PROM MMT  Shoulder Flexion 160 3+* 140* 3+*  Shoulder Extension      Shoulder Abduction WFL* 3+* 140* 3+  Shoulder Adduction      Shoulder Internal Rotation Refuses to reach behind back due to pain 3+ Refuses to reach behind back due to pain  3+  Shoulder External Rotation Reaches to T2 SP* 3+ Reaches to C7 SP* 3+  Elbow Flexion      Elbow Extension      Wrist Flexion      Wrist Extension      Wrist Supination      Wrist Pronation      Wrist Ulnar Deviation      Wrist Radial Deviation      Grip Strength NA  NA     (Blank rows = not tested)   * pain  LOWER EXTREMITY SPECIAL TESTS:  Slump test - neg B  FUNCTIONAL TESTS:   5x STS 23.64 seconds no UE use but fearful of falling - increased hip pain  GAIT: Distance walked: 275 feet  Assistive device utilized: Single point cane Level of assistance: Modified independence Comments: limited lumbar and hip ROM- veers right - 2MW test   TODAY'S TREATMENT:  DATE:   02/28/2022 Therapeutic Exercise:  Aerobic: Supine: Prone:  Seated: shoulder flexion with cane seated 5 minutes with rest breaks as needed, shoulder bench press with cane 5 minutes total, LAQs 3x10 5" holds, piriformis stretch x3 30" holds B, glute squeze tactile cues in long sitting 5 minutes  Standing: Neuromuscular  Re-education: Manual Therapy: Therapeutic Activity: Self Care: Trigger Point Dry Needling:  Modalities:    PATIENT EDUCATION:  Education details:on HEP, on current presentation and rationale for interventions. Person educated: Patient Education method: Explanation, Demonstration, and Handouts Education comprehension: verbalized understanding   HOME EXERCISE PROGRAM: [id number]  ASSESSMENT:  CLINICAL IMPRESSION: 02/28/2022 Session limited secondary to addressing multiple complaints with pain in shoulder, neck, back and legs. Reassured patient and explained all exercises to patient. Fatigue in arms and legs noted quickly, allowed for longer rest breaks to accommodate for this. Added new exercises to HEP.    Eval:Patient presents to physical therapy with multiple complaints including neck pain, bilateral shoulder pain, right hip pain, balance deficits and right lower extremity pain and weakness.  Session focused on education and review of current presentation, assessment limited to lower extremity at this time but will assess and include neck and shoulders and a plan of care.  Frequency limited secondary to patient schedule and focus will be to establish good home exercise program to work on current deficits and improve overall function, reduce fall risk and improve quality of life.  OBJECTIVE IMPAIRMENTS: Abnormal gait, decreased activity tolerance, decreased balance, decreased endurance, decreased knowledge of use of DME, decreased mobility, difficulty walking, decreased ROM, decreased strength, impaired UE functional use, postural dysfunction, and pain.   ACTIVITY LIMITATIONS: sitting, standing, squatting, transfers, reach over head, and locomotion level  PARTICIPATION LIMITATIONS: community activity  PERSONAL FACTORS: Age, Fitness, Past/current experiences, Time since onset of injury/illness/exacerbation, and 3+ comorbidities: x4 right knee surgeries, chest radiation, cervical fusion  C3-7,  are also affecting patient's functional outcome.   REHAB POTENTIAL: Good  CLINICAL DECISION MAKING: Evolving/moderate complexity  EVALUATION COMPLEXITY: Moderate   GOALS: Goals reviewed with patient? yes  SHORT TERM GOALS: Target date: 03/20/2022  Patient will be independent in self management strategies to improve quality of life and functional outcomes. Baseline: New Program Goal status: INITIAL  2.  Patient will report at least  25% improvement in overall hip symptoms and/or function to demonstrate improved functional mobility Baseline: 0% better Goal status: INITIAL  3.  Patient will be able to perform 5x STS in < 15 seconds to demonstrate improved functional strength. Baseline:  Goal status: INITIAL  4.  Patient will report being able to walk at least 10 minutes without severe pain with cane to improve functional mobility. Baseline:  Goal status: INITIAL    LONG TERM GOALS: Target date: 05/01/2022   Patient will report at least 25% improvement in overall neck symptoms and/or function to demonstrate improved functional mobility Baseline: 0% better Goal status: INITIAL  2.  Patient will demonstrate at least 4- out of 5 lower extremity strength in seated position Goal status: INITIAL  3.  Patient will report performing daily exercises for neck shoulders and lower extremities to improve overall strength and mobility. Baseline:  Goal status: INITIAL     PLAN:  PT FREQUENCY: 1-2x/week  PT DURATION: 12 weeks  PLANNED INTERVENTIONS: Therapeutic exercises, Therapeutic activity, Neuromuscular re-education, Balance training, Gait training, Patient/Family education, Self Care, Joint mobilization, Joint manipulation, Stair training, Vestibular training, Canalith repositioning, Orthotic/Fit training, Dry Needling, Electrical stimulation, Spinal manipulation, Spinal mobilization, Cryotherapy,  Moist heat, Ultrasound, Ionotophoresis 77m/ml Dexamethasone, Manual therapy,  and Re-evaluation  PLAN FOR NEXT SESSION: Develop HEP, review app for medbridge   1:42 PM, 02/28/22 MJerene Pitch DPT Physical Therapy with [facility]

## 2022-02-28 NOTE — Patient Instructions (Addendum)
Good to see you!  Use the triamcinolone cream as directed on your rash. Recheck sooner if spreading / worse / any changes.

## 2022-03-07 ENCOUNTER — Encounter: Payer: Self-pay | Admitting: Physical Therapy

## 2022-03-07 ENCOUNTER — Ambulatory Visit: Payer: Medicare HMO | Admitting: Physical Therapy

## 2022-03-07 DIAGNOSIS — M25551 Pain in right hip: Secondary | ICD-10-CM

## 2022-03-07 DIAGNOSIS — M542 Cervicalgia: Secondary | ICD-10-CM

## 2022-03-07 DIAGNOSIS — M6281 Muscle weakness (generalized): Secondary | ICD-10-CM

## 2022-03-07 DIAGNOSIS — R6889 Other general symptoms and signs: Secondary | ICD-10-CM | POA: Diagnosis not present

## 2022-03-07 NOTE — Therapy (Signed)
OUTPATIENT PHYSICAL THERAPY TREATMENT NOTE   Patient Name: Felicia [name] MRN: [id number] DOB:Jun 05, 1950, 72 y.o., female Today's Date: 03/07/2022  PCP: Fredirick Lathe, PA  REFERRING PROVIDER: Gregor Hams, MD  END OF SESSION:   PT End of Session - 03/07/22 1302     Visit Number 4    Number of Visits 12    Date for PT Re-Evaluation 05/01/22    Authorization Type Mcarthur Rossetti auth approved 12 visits approved till 05/01/22    Authorization - Visit Number 4    Authorization - Number of Visits 12    PT Start Time [id number]    PT Stop Time 1342    PT Time Calculation (min) 38 min    Activity Tolerance Patient tolerated treatment well             Past Medical History:  Diagnosis Date   Anemia    Anxiety    Breast cancer ([place]) 09/2019   left breast IMC   CAD (coronary artery disease)    a. 3/2007s/p DES to the LAD [facility]); b. 01/2017 MV: EF 80%, small, mild apical ant defect w/o ischemia (felt to be breast atten). Low risk.   Depression    DM (diabetes mellitus) ([place])    Essential hypertension    GERD (gastroesophageal reflux disease)    Headache    secondary to neck surgery per patient   High triglycerides    Hyperlipidemia    Hypothyroid    OA (osteoarthritis)    right knee,hands   Overflow incontinence    PONV (postoperative nausea and vomiting)    s/p gallbladder    RLS (restless legs syndrome)    Urinary urgency    Uterine cancer ([place]) dx'd 2014   Past Surgical History:  Procedure Laterality Date   BREAST LUMPECTOMY WITH RADIOACTIVE SEED AND SENTINEL LYMPH NODE BIOPSY Left 10/07/2019   Procedure: LEFT BREAST LUMPECTOMY WITH RADIOACTIVE SEED AND SENTINEL LYMPH NODE BIOPSY;  Surgeon: Rolm Bookbinder, MD;  Location: [place];  Service: General;  Laterality: Left;   CORONARY STENT PLACEMENT     CORONARY/GRAFT ACUTE MI REVASCULARIZATION     FINGER ARTHRODESIS Right 06/11/2019   Procedure: ARTHRODESIS INDEX FINGER DISTAL PHALANGEAL JOINT;   Surgeon: [name], MD;  Location: [place];  Service: Orthopedics;  Laterality: Right;   FOOT SURGERY Left    LEFT HEART CATH AND CORONARY ANGIOGRAPHY N/A 02/16/2018   Procedure: LEFT HEART CATH AND CORONARY ANGIOGRAPHY;  Surgeon: Lorretta Harp, MD;  Location: [place] [facility];  Service: Cardiovascular;  Laterality: N/A;   LEFT HEART CATH AND CORONARY ANGIOGRAPHY N/A 05/01/2020   Procedure: LEFT HEART CATH AND CORONARY ANGIOGRAPHY;  Surgeon: Lorretta Harp, MD;  Location: [place] [facility];  Service: Cardiovascular;  Laterality: N/A;   PORTACATH PLACEMENT Right 11/23/2019   Procedure: INSERTION PORT-A-CATH WITH ULTRASOUND GUIDANCE;  Surgeon: Rolm Bookbinder, MD;  Location: [place];  Service: General;  Laterality: Right;   REPLACEMENT TOTAL KNEE Right    SPINE SURGERY     TOTAL ABDOMINAL HYSTERECTOMY     FOR UTERUS CANCER   TOTAL HIP ARTHROPLASTY Left 2015   Patient Active Problem List   Diagnosis Date Noted   Erosive osteoarthritis of multiple sites 09/26/2021   Frequent headaches 05/31/2021   Hoarse 01/25/2021   Hyperglycemia due to type 2 diabetes mellitus ([place]) 01/18/2021   Mixed simple and mucopurulent chronic bronchitis ([place]) 12/27/2020   Centrilobular emphysema ([place]) 07/31/2020   Shortness  of breath 07/31/2020   Pressure injury of skin 04/30/2020   Hypothyroid    Anxiety    Anxiety and depression    Port-A-Cath in place 11/24/2019   Hepatic steatosis 09/28/2019   Malignant neoplasm of upper-outer quadrant of left breast in female, estrogen receptor positive ([place]) 09/02/2019   Trigger index finger of right hand 05/05/2019   Primary osteoarthritis of first carpometacarpal joint of right hand 05/05/2019   Endometrial cancer ([place]) 10/23/2018   Cirrhosis of liver without ascites ([place]) 10/23/2018   Diarrhea 10/23/2018   Chest pain 02/15/2018   Coronary artery disease involving native coronary artery of native heart without angina  pectoris 11/18/2017   Hyperlipidemia LDL goal <70 11/18/2017   Type 2 diabetes mellitus without complication, without long-term current use of insulin ([place]) 11/18/2017   Dizziness 11/18/2017     THERAPY DIAG:  Pain in right hip  Muscle weakness (generalized)  Neck pain   REFERRING DIAG: M70.61 (ICD-10-CM) - Trochanteric bursitis of right hip M54.2 (ICD-10-CM) - Neck pain   Rationale for Evaluation and Treatment: Rehabilitation  ONSET DATE: years  SUBJECTIVE:   SUBJECTIVE STATEMENT: 03/07/2022 States she is not doing well. States she was in a lot of pain on Monday while at [employer]. States she had to leave as she was in so much pain. States her balance is off and is still in pain and has been in pain since Monday. States she sees the MD next week, states she can't deal with this pain. States she has not tried heat. States pain is in her butt and right hips. States that she took a tramadol before she came but wish she took as she is about to cry she is in so much pain.  Eval:States that she has had right hip pain for a while off and on. States she feels it is doing alright but it is "cooking" to be redone. States she has pain with sitting. States that sometimes her right leg collapses on her and this has happened for years and her right leg would give ou on her. States she had multiple knee surgeries and a replacement which didn't help. States that her pain wraps around from her right hip the front of her thigh and into her shin.   Reports she knows her legs are weak which is why she avoids stairs. States she used to walk 4 miles a day but then she had difficulties with her left hip and haven't walked a lot since. States she is now having difficulties getting in and out of a car and she has to pick up both legs to get in and out of her car. Reports anytime she has to put the fitted sheet on her bed she falls (onto her bed).   States her neck/shoulder pain is always there and she gets  headaches from it. States she is going to a neurologist who gave her gabapentin for her shoulder pain. States she feels like massage helps a bit - she has one of the self release hooks to help. States she has to be careful with her pocketbook. States she has a torn tendon on her right shoulder.    States she veers right when she walks and her MD said her balance will always be the same. Reports she uses the cane but tries to walk without her cane.   PERTINENT HISTORY: Diabetes, history of breast cancer (LEFT), history of endometrial cancer (with history of radiation), spine surgery C3-C7 fusion 2017, heart disease, L  THA 2015, history of 3 right knee scopes and right TKA, history of inner ear issue (required epley  maneuver), PAIN:  Are you having pain? Yes: NPRS scale: 6/10 Pain location: right hip and led Pain description: constant ache Aggravating factors: walking, stairs, sitting long periods of time  Relieving factors: uses cane when in pain   PRECAUTIONS: None  WEIGHT BEARING RESTRICTIONS: No  FALLS:  Has patient fallen in last 6 months? Yes. Number of falls 1  LIVING ENVIRONMENT: Lives with: lives alone Lives in: House/apartment Stairs: No Has following equipment at home: Single point cane, Environmental consultant - 2 wheeled, and Grab bars  OCCUPATION: not working  PLOF: Independent with household mobility with device and Independent with community mobility with device  PATIENT GOALS: to be able to walk further- currently working 5 minutes with a cane with ease, to have less pain      OBJECTIVE:   DIAGNOSTIC FINDINGS:  FINDINGS: Pelvic ring is intact. Left hip replacement is noted in satisfactory position. Mild degenerative changes of the right hip are seen. No acute fracture or dislocation is noted. No soft tissue abnormality is seen.   IMPRESSION: No acute abnormality noted.   COGNITION: Overall cognitive status: Within functional limits for tasks assessed       EDEMA:   None   POSTURE: rounded shoulders, forward head, increased thoracic kyphosis, and posterior pelvic tilt  PALPATION: Next session    LE Measurements Lower Extremity Right 02/06/2022 Left 02/06/2022   A/PROM MMT A/PROM MMT  Hip Flexion  4-  3+  Hip Extension      Hip Abduction (seated)  3+  3+  Hip Adduction (seated)  4-  4-  Hip Internal rotation      Hip External rotation      Knee Flexion  3+  4-  Knee Extension  3  4  Ankle Dorsiflexion      Ankle Plantarflexion      Ankle Inversion      Ankle Eversion       (Blank rows = not tested) * pain   Neck  AROM:    03/07/2022      Flexion  20**     Extension 15*(compensates with lumbar extension)      R ROT  40**     L ROT  40**     R SB  10*    L SB 5*     * Pain   (Blank rows = not tested)    UE Measurements Upper Extremity Right 02/21/22 Left 02/21/22   A/PROM MMT A/PROM MMT  Shoulder Flexion 160 3+* 140* 3+*  Shoulder Extension      Shoulder Abduction WFL* 3+* 140* 3+  Shoulder Adduction      Shoulder Internal Rotation Refuses to reach behind back due to pain 3+ Refuses to reach behind back due to pain  3+  Shoulder External Rotation Reaches to T2 SP* 3+ Reaches to C7 SP* 3+  Elbow Flexion      Elbow Extension      Wrist Flexion      Wrist Extension      Wrist Supination      Wrist Pronation      Wrist Ulnar Deviation      Wrist Radial Deviation      Grip Strength NA  NA     (Blank rows = not tested)   * pain  LOWER EXTREMITY SPECIAL TESTS:  Slump test - neg B  FUNCTIONAL TESTS:  5x STS 23.64 seconds no UE use but fearful of falling - increased hip pain  GAIT: Distance walked: 275 feet  Assistive device utilized: Single point cane Level of assistance: Modified independence Comments: limited lumbar and hip ROM- veers right - 2MW test   TODAY'S TREATMENT:                                                                                                                              DATE:    03/07/2022 Therapeutic Exercise:  Aerobic: Supine: Prone:  Seated: self mobilization to right LE and lumbar with strategies to help assist with pain management strategies at home. 15 minutes, positional relief s/l/seated with cushion, piriformis stretch x3 30" holds B  Standing: Neuromuscular Re-education: Manual Therapy: IASTM to right lumbar and hip with percussion gun 15 minutes Therapeutic Activity: Self Care: Trigger Point Dry Needling:  Modalities: thermo therapy to right buttocks/hip - started in seated but then in right sidelying with education    PATIENT EDUCATION:  Education details:on HEP, on pain management strategies. On heat and use of massage.for pain management, on pain pathways Person educated: Patient Education method: Explanation, Demonstration, and Handouts Education comprehension: verbalized understanding   HOME EXERCISE PROGRAM: [id number]  ASSESSMENT:  CLINICAL IMPRESSION: 03/07/2022 Session focused on education and pain management strategies. Significant improvement in symptoms after use of heat and percussion gun. Patient with desire to get percussion gun but waiting until next pay check. Patient reported 4/10 pain end of session and overall in better spirits by end of session. Will continue with current POC as tolerated.  Eval:Patient presents to physical therapy with multiple complaints including neck pain, bilateral shoulder pain, right hip pain, balance deficits and right lower extremity pain and weakness.  Session focused on education and review of current presentation, assessment limited to lower extremity at this time but will assess and include neck and shoulders and a plan of care.  Frequency limited secondary to patient schedule and focus will be to establish good home exercise program to work on current deficits and improve overall function, reduce fall risk and improve quality of life.  OBJECTIVE IMPAIRMENTS: Abnormal gait, decreased activity  tolerance, decreased balance, decreased endurance, decreased knowledge of use of DME, decreased mobility, difficulty walking, decreased ROM, decreased strength, impaired UE functional use, postural dysfunction, and pain.   ACTIVITY LIMITATIONS: sitting, standing, squatting, transfers, reach over head, and locomotion level  PARTICIPATION LIMITATIONS: community activity  PERSONAL FACTORS: Age, Fitness, Past/current experiences, Time since onset of injury/illness/exacerbation, and 3+ comorbidities: x4 right knee surgeries, chest radiation, cervical fusion C3-7,  are also affecting patient's functional outcome.   REHAB POTENTIAL: Good  CLINICAL DECISION MAKING: Evolving/moderate complexity  EVALUATION COMPLEXITY: Moderate   GOALS: Goals reviewed with patient? yes  SHORT TERM GOALS: Target date: 03/20/2022  Patient will be independent in self management strategies to improve quality of life and functional outcomes. Baseline: New Program Goal status: INITIAL  2.  Patient will report  at least  25% improvement in overall hip symptoms and/or function to demonstrate improved functional mobility Baseline: 0% better Goal status: INITIAL  3.  Patient will be able to perform 5x STS in < 15 seconds to demonstrate improved functional strength. Baseline:  Goal status: INITIAL  4.  Patient will report being able to walk at least 10 minutes without severe pain with cane to improve functional mobility. Baseline:  Goal status: INITIAL    LONG TERM GOALS: Target date: 05/01/2022   Patient will report at least 25% improvement in overall neck symptoms and/or function to demonstrate improved functional mobility Baseline: 0% better Goal status: INITIAL  2.  Patient will demonstrate at least 4- out of 5 lower extremity strength in seated position Goal status: INITIAL  3.  Patient will report performing daily exercises for neck shoulders and lower extremities to improve overall strength and  mobility. Baseline:  Goal status: INITIAL     PLAN:  PT FREQUENCY: 1-2x/week  PT DURATION: 12 weeks  PLANNED INTERVENTIONS: Therapeutic exercises, Therapeutic activity, Neuromuscular re-education, Balance training, Gait training, Patient/Family education, Self Care, Joint mobilization, Joint manipulation, Stair training, Vestibular training, Canalith repositioning, Orthotic/Fit training, Dry Needling, Electrical stimulation, Spinal manipulation, Spinal mobilization, Cryotherapy, Moist heat, Ultrasound, Ionotophoresis 82m/ml Dexamethasone, Manual therapy, and Re-evaluation  PLAN FOR NEXT SESSION: Develop HEP, review app for medbridge   1:42 PM, 03/07/22 MJerene Pitch DPT Physical Therapy [facility]

## 2022-03-12 ENCOUNTER — Ambulatory Visit: Payer: Medicare Other | Admitting: Neurology

## 2022-03-12 NOTE — Progress Notes (Unsigned)
   I, Peterson Lombard, LAT, ATC acting as a scribe for [name], MD.  Felicia [name] is a 72 y.o. female who presents to [place] at [facility] today for 6-week follow-up right lateral hip pain and bilateral trapezius muscle spasm and dysfunction.  Patient was last seen by Dr. Georgina Snell on 01/29/2022 and was referred to PT, completing 4 visits.  Today, patient reports***  Dx imaging: 08/09/21 Chest/ab/pelvis CT 06/27/21 Chest XR 04/19/21 Bilat hip XR   Pertinent review of systems: ***  Relevant historical information: ***   Exam:  There were no vitals taken for this visit. General: Well Developed, well nourished, and in no acute distress.   MSK: ***    Lab and Radiology Results No results found for this or any previous visit (from the past 72 hour(s)). No results found.     Assessment and Plan: 72 y.o. female with ***   PDMP not reviewed this encounter. No orders of the defined types were placed in this encounter.  No orders of the defined types were placed in this encounter.    Discussed warning signs or symptoms. Please see discharge instructions. Patient expresses understanding.   ***

## 2022-03-13 ENCOUNTER — Ambulatory Visit (INDEPENDENT_AMBULATORY_CARE_PROVIDER_SITE_OTHER): Payer: Medicare HMO

## 2022-03-13 ENCOUNTER — Ambulatory Visit: Payer: Medicare HMO | Admitting: Family Medicine

## 2022-03-13 ENCOUNTER — Encounter: Payer: Self-pay | Admitting: Family Medicine

## 2022-03-13 ENCOUNTER — Ambulatory Visit: Payer: Self-pay

## 2022-03-13 VITALS — BP 120/70 | HR 81 | Ht 64.0 in | Wt 170.4 lb

## 2022-03-13 DIAGNOSIS — M542 Cervicalgia: Secondary | ICD-10-CM

## 2022-03-13 DIAGNOSIS — M5416 Radiculopathy, lumbar region: Secondary | ICD-10-CM | POA: Diagnosis not present

## 2022-03-13 DIAGNOSIS — M7061 Trochanteric bursitis, right hip: Secondary | ICD-10-CM

## 2022-03-13 DIAGNOSIS — R6889 Other general symptoms and signs: Secondary | ICD-10-CM | POA: Diagnosis not present

## 2022-03-13 NOTE — Patient Instructions (Signed)
Thank you for coming in today.   Please get an Xray today before you leave   You should hear from MRI scheduling within 1 week. If you do not hear please let me know.    Call or go to the ER if you develop a large red swollen joint with extreme pain or oozing puss.    Return following the MRI.   Ok to pause PT until after the MRIs.  Call the PT office and let them know.

## 2022-03-14 ENCOUNTER — Encounter: Payer: Medicare HMO | Admitting: Physical Therapy

## 2022-03-18 NOTE — Progress Notes (Signed)
X-ray of the cervical spine shows areas of prior surgeries with intact hardware.  There is a lot of metal there.  I canceled the MRI of the cervical spine because the MRI seem to have a really hard time seeing that area well.  Will need to do a CT myelogram which is a much more involved test.  Will talk more about it we will get your MRI results back from your lumbar spine.

## 2022-03-19 ENCOUNTER — Ambulatory Visit: Payer: Medicare HMO | Admitting: Family Medicine

## 2022-03-19 ENCOUNTER — Ambulatory Visit (INDEPENDENT_AMBULATORY_CARE_PROVIDER_SITE_OTHER): Payer: Medicare HMO

## 2022-03-19 VITALS — BP 138/78 | HR 78 | Ht 64.0 in | Wt 167.0 lb

## 2022-03-19 DIAGNOSIS — M79671 Pain in right foot: Secondary | ICD-10-CM | POA: Diagnosis not present

## 2022-03-19 NOTE — Patient Instructions (Addendum)
Thank you for coming in today.   Please get an Xray today before you leave   Wear the CAM walker boot  Check back in 2 weeks (around 3/19 to combine low back pain w/ MRI review, neck pain, and foot pain)

## 2022-03-19 NOTE — Progress Notes (Unsigned)
   I, Peterson Lombard, LAT, ATC acting as a scribe for Felicia Leader, MD.  Felicia [name] is a 72 y.o. female who presents to [place] at [facility] today for R foot pain.  Pt was previously seen by Dr. Georgina Snell on 03/13/2022 for right hip, neck, and low back pain.   Today, patient presents with right foot pain ongoing since last night.  Patient locates pain to the medial arch and into the plantar aspect of the R calcaneous.   Aggravates: bearing any weight Treatments tried: using her walker, compression sleeve  Pertinent review of systems: No fevers or chills  Relevant historical information: Diabetes.  History of endometrial cancer.   Exam:  BP 138/78   Pulse 78   Ht '5\' 4"'$  (1.626 m)   Wt 167 lb (75.8 kg)   SpO2 97%   BMI 28.67 kg/m  General: Well Developed, well nourished, and in no acute distress.   MSK: Right foot: Normal-appearing Tender palpation plantar calcaneus. Normal foot and ankle motion. Intact strength.    Lab and Radiology Results  X-ray images right ankle obtained today personally and independently interpreted. Mild calcaneal heel spur present.  No acute fractures are visible in the foot or ankle visible on ankle x-ray. Await formal radiology review    Assessment and Plan: 72 y.o. female with right plantar calcaneal pain.  Pain occurred suddenly without injury.  Clinically this acts like a partial plantar fascia rupture or tear.  Will treat with immobilization with CAM Walker boot and continued use of the walker that she is using in clinic today.  Recheck in about 2 weeks.  Consider injection at that time. She has a lumbar spine MRI pending for March 14.  We can review the lumbar spine MRI results at that time.  PDMP not reviewed this encounter. Orders Placed This Encounter  Procedures   DG Ankle Complete Right    Standing Status:   Future    Number of Occurrences:   1    Standing Expiration Date:   03/19/2023    Order Specific Question:    Reason for Exam (SYMPTOM  OR DIAGNOSIS REQUIRED)    Answer:   right foot pain    Order Specific Question:   Preferred imaging location?    Answer:   [facility]   No orders of the defined types were placed in this encounter.    Discussed warning signs or symptoms. Please see discharge instructions. Patient expresses understanding.   The above documentation has been reviewed and is accurate and complete Felicia [name], M.D.

## 2022-03-20 NOTE — Progress Notes (Signed)
Right ankle x-ray shows a small heel spur and a little bit of arthritis.

## 2022-03-21 ENCOUNTER — Encounter: Payer: Medicare HMO | Admitting: Physical Therapy

## 2022-03-25 ENCOUNTER — Ambulatory Visit: Payer: Medicare Other | Admitting: Physician Assistant

## 2022-03-26 ENCOUNTER — Telehealth: Payer: Self-pay

## 2022-03-26 ENCOUNTER — Encounter: Payer: Self-pay | Admitting: Physician Assistant

## 2022-03-26 ENCOUNTER — Ambulatory Visit: Payer: Medicare HMO | Admitting: Physician Assistant

## 2022-03-26 VITALS — BP 126/58 | HR 65 | Temp 97.5°F | Ht 64.0 in

## 2022-03-26 DIAGNOSIS — D508 Other iron deficiency anemias: Secondary | ICD-10-CM

## 2022-03-26 DIAGNOSIS — M154 Erosive (osteo)arthritis: Secondary | ICD-10-CM | POA: Diagnosis not present

## 2022-03-26 DIAGNOSIS — R748 Abnormal levels of other serum enzymes: Secondary | ICD-10-CM

## 2022-03-26 DIAGNOSIS — E1165 Type 2 diabetes mellitus with hyperglycemia: Secondary | ICD-10-CM | POA: Diagnosis not present

## 2022-03-26 DIAGNOSIS — E119 Type 2 diabetes mellitus without complications: Secondary | ICD-10-CM | POA: Diagnosis not present

## 2022-03-26 DIAGNOSIS — E039 Hypothyroidism, unspecified: Secondary | ICD-10-CM

## 2022-03-26 DIAGNOSIS — M8580 Other specified disorders of bone density and structure, unspecified site: Secondary | ICD-10-CM | POA: Diagnosis not present

## 2022-03-26 DIAGNOSIS — R6889 Other general symptoms and signs: Secondary | ICD-10-CM | POA: Diagnosis not present

## 2022-03-26 LAB — IBC + FERRITIN
Ferritin: 42.2 ng/mL (ref 10.0–291.0)
Iron: 94 ug/dL (ref 42–145)
Saturation Ratios: 17 % — ABNORMAL LOW (ref 20.0–50.0)
TIBC: 554.4 ug/dL — ABNORMAL HIGH (ref 250.0–450.0)
Transferrin: 396 mg/dL — ABNORMAL HIGH (ref 212.0–360.0)

## 2022-03-26 LAB — COMPREHENSIVE METABOLIC PANEL
ALT: 26 U/L (ref 0–35)
AST: 21 U/L (ref 0–37)
Albumin: 4.7 g/dL (ref 3.5–5.2)
Alkaline Phosphatase: 51 U/L (ref 39–117)
BUN: 22 mg/dL (ref 6–23)
CO2: 24 mEq/L (ref 19–32)
Calcium: 10.9 mg/dL — ABNORMAL HIGH (ref 8.4–10.5)
Chloride: 103 mEq/L (ref 96–112)
Creatinine, Ser: 0.64 mg/dL (ref 0.40–1.20)
GFR: 88.41 mL/min (ref >60.00)
Glucose, Bld: 185 mg/dL — ABNORMAL HIGH (ref 70–99)
Potassium: 4.3 mEq/L (ref 3.5–5.1)
Sodium: 140 mEq/L (ref 135–145)
Total Bilirubin: 0.4 mg/dL (ref 0.2–1.2)
Total Protein: 7.4 g/dL (ref 6.0–8.3)

## 2022-03-26 LAB — POCT GLYCOSYLATED HEMOGLOBIN (HGB A1C): Hemoglobin A1C: 7.4 % — AB (ref 4.0–5.6)

## 2022-03-26 LAB — CBC WITH DIFFERENTIAL/PLATELET
Basophils Absolute: 0 10*3/uL (ref 0.0–0.1)
Basophils Relative: 0.2 % (ref 0.0–3.0)
Eosinophils Absolute: 0.1 10*3/uL (ref 0.0–0.7)
Eosinophils Relative: 0.5 % (ref 0.0–5.0)
HCT: 45.7 % (ref 36.0–46.0)
Hemoglobin: 15 g/dL (ref 12.0–15.0)
Lymphocytes Relative: 63.5 % — ABNORMAL HIGH (ref 12.0–46.0)
Lymphs Abs: 8.8 10*3/uL — ABNORMAL HIGH (ref 0.7–4.0)
MCHC: 32.9 g/dL (ref 30.0–36.0)
MCV: 93.1 fl (ref 78.0–100.0)
Monocytes Absolute: 0.5 10*3/uL (ref 0.1–1.0)
Monocytes Relative: 3.5 % (ref 3.0–12.0)
Neutro Abs: 4.5 10*3/uL (ref 1.4–7.7)
Neutrophils Relative %: 32.3 % — ABNORMAL LOW (ref 43.0–77.0)
Platelets: 276 10*3/uL (ref 150.0–400.0)
RBC: 4.91 Mil/uL (ref 3.87–5.11)
RDW: 14.5 % (ref 11.5–15.5)
WBC: 13.9 10*3/uL — ABNORMAL HIGH (ref 4.0–10.5)

## 2022-03-26 LAB — VITAMIN B12: Vitamin B-12: 362 pg/mL (ref 211–911)

## 2022-03-26 LAB — TSH: TSH: 0.1 u[IU]/mL — ABNORMAL LOW (ref 0.35–5.50)

## 2022-03-26 LAB — VITAMIN D 25 HYDROXY (VIT D DEFICIENCY, FRACTURES): VITD: 112.91 ng/mL (ref 30.00–100.00)

## 2022-03-26 NOTE — Progress Notes (Signed)
Subjective:    Patient ID: Felicia [name], female    DOB: 09/10/1950, 72 y.o.   MRN: [id number]  Chief Complaint  Patient presents with   Follow-up    Pt in the office for 3 mon f/u; pt has right heel pain and seeing dr Georgina Snell at [facility]; has MRI this week for lower back; had cortisone injection and has helped pain some. Pt has no refills of tramadol left and needs refill; wants to discuss diabetic shoes    HPI Patient is in today for 3 month f/up. See A/P for details.   Past Medical History:  Diagnosis Date   Anemia    Anxiety    Breast cancer ([place]) 09/2019   left breast IMC   CAD (coronary artery disease)    a. 3/2007s/p DES to the LAD [facility]); b. 01/2017 MV: EF 80%, small, mild apical ant defect w/o ischemia (felt to be breast atten). Low risk.   Depression    DM (diabetes mellitus) ([place])    Essential hypertension    GERD (gastroesophageal reflux disease)    Headache    secondary to neck surgery per patient   High triglycerides    Hyperlipidemia    Hypothyroid    OA (osteoarthritis)    right knee,hands   Overflow incontinence    PONV (postoperative nausea and vomiting)    s/p gallbladder    RLS (restless legs syndrome)    Urinary urgency    Uterine cancer ([place]) dx'd 2014    Past Surgical History:  Procedure Laterality Date   BREAST LUMPECTOMY WITH RADIOACTIVE SEED AND SENTINEL LYMPH NODE BIOPSY Left 10/07/2019   Procedure: LEFT BREAST LUMPECTOMY WITH RADIOACTIVE SEED AND SENTINEL LYMPH NODE BIOPSY;  Surgeon: Rolm Bookbinder, MD;  Location: [facility];  Service: General;  Laterality: Left;   CORONARY STENT PLACEMENT     CORONARY/GRAFT ACUTE MI REVASCULARIZATION     FINGER ARTHRODESIS Right 06/11/2019   Procedure: ARTHRODESIS INDEX FINGER DISTAL PHALANGEAL JOINT;  Surgeon: [name], MD;  Location: [facility];  Service: Orthopedics;  Laterality: Right;   FOOT SURGERY Left    LEFT HEART CATH AND CORONARY ANGIOGRAPHY N/A  02/16/2018   Procedure: LEFT HEART CATH AND CORONARY ANGIOGRAPHY;  Surgeon: Lorretta Harp, MD;  Location: [facility];  Service: Cardiovascular;  Laterality: N/A;   LEFT HEART CATH AND CORONARY ANGIOGRAPHY N/A 05/01/2020   Procedure: LEFT HEART CATH AND CORONARY ANGIOGRAPHY;  Surgeon: Lorretta Harp, MD;  Location: [facility];  Service: Cardiovascular;  Laterality: N/A;   PORTACATH PLACEMENT Right 11/23/2019   Procedure: INSERTION PORT-A-CATH WITH ULTRASOUND GUIDANCE;  Surgeon: Rolm Bookbinder, MD;  Location: [facility];  Service: General;  Laterality: Right;   REPLACEMENT TOTAL KNEE Right    SPINE SURGERY     TOTAL ABDOMINAL HYSTERECTOMY     FOR UTERUS CANCER   TOTAL HIP ARTHROPLASTY Left 2015    Family History  Problem Relation Age of Onset   AAA (abdominal aortic aneurysm) Mother    Diabetes Father    Alzheimer's disease Father    Throat cancer Sister    Breast cancer Cousin     Social History   Tobacco Use   Smoking status: Never   Smokeless tobacco: Never  Vaping Use   Vaping Use: Never used  Substance Use Topics   Alcohol use: Yes    Comment: OCCASIONALLY   Drug use: No     Allergies  Allergen Reactions  Chloraprep One Step [Chlorhexidine Gluconate] Rash   Estrogens Other (See Comments)    PATIENT HAS A HISTORY OF CANCER AND HAS BEEN TOLD TO NEVER TAKE ANYTHING CONTAINING ESTROGEN, AS IT MIGHT CAUSE A RECURRENCE   Shrimp [Shellfish Allergy] Nausea And Vomiting    Review of Systems NEGATIVE UNLESS OTHERWISE INDICATED IN HPI      Objective:     BP (!) 126/58 (BP Location: Left Arm)   Pulse 65   Temp (!) 97.5 F (36.4 C) (Temporal)   Ht 5\' 4"  (1.626 m)   SpO2 98%   BMI 28.67 kg/m   Wt Readings from Last 3 Encounters:  03/19/22 167 lb (75.8 kg)  03/13/22 170 lb 6.4 oz (77.3 kg)  02/28/22 170 lb 6.4 oz (77.3 kg)    BP Readings from Last 3 Encounters:  03/26/22 (!) 126/58  03/19/22 138/78  03/13/22 120/70      Physical Exam Constitutional:      General: She is not in acute distress.    Appearance: Normal appearance. She is not toxic-appearing.  Cardiovascular:     Rate and Rhythm: Normal rate and regular rhythm.     Pulses: Normal pulses.     Heart sounds: Normal heart sounds.  Pulmonary:     Effort: Pulmonary effort is normal.     Breath sounds: Normal breath sounds.  Skin:    Findings: No rash.  Neurological:     General: No focal deficit present.     Mental Status: She is alert.     Cranial Nerves: No cranial nerve deficit.     Motor: No weakness.     Gait: Gait normal.  Psychiatric:        Mood and Affect: Mood normal.        Behavior: Behavior normal.        Assessment & Plan:  Type 2 diabetes mellitus with hyperglycemia, without long-term current use of insulin (HCC) -     POCT glycosylated hemoglobin (Hb A1C) -     Comprehensive metabolic panel -     Ambulatory referral to Podiatry  Type 2 diabetes mellitus without complication, without long-term current use of insulin (HCC) Assessment & Plan: Lab Results  Component Value Date   HGBA1C 7.4 (A) 03/26/2022   Stable Continue metformin 1000 mg twice daily, glipizide XL 20 mg daily. Trulicity 3 mg once weekly.  Jardiance 25 mg once daily. Continue to monitor glucose readings at home.  Call if any concerns.   Erosive osteoarthritis of multiple sites Assessment & Plan: Still working with Dr. Trudie Reed. Also working with Dr. Georgina Snell for steroid injections.  She is taking Celebrex 200 mg twice daily. Limit Tylenol to 3000 mg daily max.   Prescription for tramadol 50 mg to take up to 3 times daily for added pain relief.  Started this prescription in Dec 2023 & helping for moderate to severe pain when needed. Caution with driving. PDMP reviewed today, no red flags, filling appropriately.  Refilled today.    Hypothyroidism, unspecified type -     TSH  Other iron deficiency anemia -     CBC with Differential/Platelet -      IBC + Ferritin  Elevated vitamin B12 level -     Vitamin B12  Osteopenia, unspecified location -     VITAMIN D 25 Hydroxy (Vit-D Deficiency, Fractures)        Return in about 4 months (around 07/26/2022) for recheck .  This note was prepared with assistance of Dragon  voice recognition software. Occasional wrong-word or sound-a-like substitutions may have occurred due to the inherent limitations of voice recognition software.     Marijose Curington M Keeli Roberg, PA-C

## 2022-03-26 NOTE — Telephone Encounter (Signed)
Hope from [facility] contacting office to let us know patient has critically high Vitamin D level that's 112.91. Advised Dr Jerline Pain and was advised patient was ok to be handled in the morning by PCP.

## 2022-03-26 NOTE — Telephone Encounter (Signed)
Noted and agreed, thank you. 

## 2022-03-26 NOTE — Assessment & Plan Note (Signed)
Still working with Dr. Trudie Reed. Also working with Dr. Georgina Snell for steroid injections.  She is taking Celebrex 200 mg twice daily. Limit Tylenol to 3000 mg daily max.   Prescription for tramadol 50 mg to take up to 3 times daily for added pain relief.  Started this prescription in Dec 2023 & helping for moderate to severe pain when needed. Caution with driving. PDMP reviewed today, no red flags, filling appropriately.  Refilled today.

## 2022-03-26 NOTE — Patient Instructions (Signed)
Good to see you! Labs today - will let you know of results.  Referral to podiatry  Your Ha1c was 7.4% - great care of your diabetes! Keep up good work!!!  I will also refill the tramadol prescription to take for moderate to severe pain as needed.

## 2022-03-27 ENCOUNTER — Encounter: Payer: Self-pay | Admitting: Oncology

## 2022-03-28 ENCOUNTER — Ambulatory Visit
Admission: RE | Admit: 2022-03-28 | Discharge: 2022-03-28 | Disposition: A | Discharge status: Home / Self Care | Payer: Medicare HMO | Source: Ambulatory Visit | Attending: Family Medicine | Admitting: Family Medicine

## 2022-03-28 ENCOUNTER — Other Ambulatory Visit: Payer: Self-pay

## 2022-03-28 ENCOUNTER — Other Ambulatory Visit: Payer: Self-pay | Admitting: Physician Assistant

## 2022-03-28 DIAGNOSIS — R748 Abnormal levels of other serum enzymes: Secondary | ICD-10-CM

## 2022-03-28 DIAGNOSIS — M5416 Radiculopathy, lumbar region: Secondary | ICD-10-CM | POA: Diagnosis not present

## 2022-03-28 DIAGNOSIS — E118 Type 2 diabetes mellitus with unspecified complications: Secondary | ICD-10-CM

## 2022-03-28 DIAGNOSIS — M48061 Spinal stenosis, lumbar region without neurogenic claudication: Secondary | ICD-10-CM | POA: Diagnosis not present

## 2022-03-28 DIAGNOSIS — D508 Other iron deficiency anemias: Secondary | ICD-10-CM

## 2022-03-28 DIAGNOSIS — E039 Hypothyroidism, unspecified: Secondary | ICD-10-CM

## 2022-03-28 MED ORDER — LEVOTHYROXINE SODIUM 125 MCG PO TABS: 125.0000 ug | ORAL_TABLET | Freq: Every day | ORAL | 2 refills | Status: DC

## 2022-03-29 NOTE — Assessment & Plan Note (Signed)
Lab Results  Component Value Date   HGBA1C 7.4 (A) 03/26/2022   Stable Continue metformin 1000 mg twice daily, glipizide XL 20 mg daily. Trulicity 3 mg once weekly.  Jardiance 25 mg once daily. Continue to monitor glucose readings at home.  Call if any concerns.

## 2022-04-02 ENCOUNTER — Ambulatory Visit: Payer: Medicare HMO | Admitting: Family Medicine

## 2022-04-02 VITALS — BP 122/68 | HR 77 | Ht 64.0 in | Wt 169.0 lb

## 2022-04-02 DIAGNOSIS — M722 Plantar fascial fibromatosis: Secondary | ICD-10-CM

## 2022-04-02 DIAGNOSIS — M5416 Radiculopathy, lumbar region: Secondary | ICD-10-CM

## 2022-04-02 DIAGNOSIS — R6889 Other general symptoms and signs: Secondary | ICD-10-CM | POA: Diagnosis not present

## 2022-04-02 NOTE — Progress Notes (Unsigned)
   Shirlyn Goltz, PhD, LAT, ATC acting as a scribe for [name], MD.  Felicia [name] is a 72 y.o. female who presents to [place] at [facility] today for L-spine MRI review, f/u neck and right foot pain.  Patient was last seen by Dr. Georgina Snell on 03/19/2022 and was advised to immobilize right foot with a cam walker boot and to continue using her walker. Today, patient reports low back pain is about the same, sore last night w/ pain wrapping around into the lateral rib cage. Pt has been able to wean away from using her walker. Pt locates pain to the R heel, esp after prolonged time wearing the CAM walker boot.   Dx imaging: 03/28/2022 L-spine MRI  03/19/22 R ankle XR  03/13/22 C-spine XR  Pertinent review of systems: ***  Relevant historical information: ***   Exam:  There were no vitals taken for this visit. General: Well Developed, well nourished, and in no acute distress.   MSK: ***    Lab and Radiology Results No results found for this or any previous visit (from the past 72 hour(s)). No results found.     Assessment and Plan: 72 y.o. female with ***   PDMP not reviewed this encounter. No orders of the defined types were placed in this encounter.  No orders of the defined types were placed in this encounter.    Discussed warning signs or symptoms. Please see discharge instructions. Patient expresses understanding.   ***

## 2022-04-02 NOTE — Patient Instructions (Addendum)
Thank you for coming in today.   Please complete the exercises that the athletic trainer went over with you:  View at [email or web address] using code: [email or web address]  Check back in 1 month

## 2022-04-05 NOTE — Progress Notes (Unsigned)
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 Limit use of pain relievers to no more than 2 days out of week to prevent risk of rebound or medication-overuse headache. Follow up in 4-5 months.         Subjective:  Felicia [name] is a 73 year old female with CAD, HTN, HLD, DM II, hypothyroidism, osteoarthritis, depression, anxiety and history of breast cancer and uterine cancer who follows up for headache.  UPDATE: Last visit, titrated gabapentin to 600mg  BID. ***  Current NSAIDS/analgesics:  ASA 81mg  daily, celecoxib Current triptans:  none Current ergotamine:  none Current anti-emetic:  Compazine Current muscle relaxants:  none Current Antihypertensive medications:  none Current Antidepressant medications:  escitalopram 20mg  Current Anticonvulsant medications:  gabapentin 600mg  BID Current anti-CGRP:  none Current Antihistamines/Decongestants:  Claritin Other therapy:  none Other medications:  ropinirole 0.5mg  QHS     Caffeine:  no coffee.  Diet cola.  Decaf tea   HISTORY: She has had occasional sharp right sided paroxysmal headaches since childhood.  In 2017, she started developing occipital headaches with neck pain, left arm pain and balance problems.  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  Continues to have chronic neck pain and headache.  Neck pain radiates down into both shoulders.  Headache is often diffuse and throbbing but still has occasional sharp right sided headache.  Also may have left eye pain that radiates to top of head.  Sometimes phonophobia but usually not.  No nausea, vomiting, photophobia or visual disturbance.  Always with a  persistent dull headache but has exacerbations everyday lasting a few hours.  Takes Tylenol or another "headache medication" daily.     MRI of brain with and without contrast on 02/14/2021 personally reviewed showed mild chronic small vessel ischemic changes within the cerebral white matter but overall unremarkable.     Past NSAIDS/analgesics:  tramadol Past abortive triptans:  none Past abortive ergotamine:  none Past muscle relaxants:  tizanidine Past anti-emetic:  Zofran 8mg  Past antihypertensive medications:  none Past antidepressant medications:  amitriptyline (helpful) Past anticonvulsant medications:  none Past anti-CGRP:  none Past vitamins/Herbal/Supplements:  none Past antihistamines/decongestants:  none Other past therapies:  none    Family history of headache:  unknown.      PAST MEDICAL HISTORY: Past Medical History:  Diagnosis Date   Anemia    Anxiety    Breast cancer ([place]) 09/2019   left breast IMC   CAD (coronary artery disease)    a. 3/2007s/p DES to the LAD [facility]); b. 01/2017 MV: EF 80%, small, mild apical ant defect w/o ischemia (felt to be breast atten). Low risk.   Depression    DM (diabetes mellitus) ([place])    Essential hypertension    GERD (gastroesophageal reflux disease)    Headache    secondary to neck surgery per patient   High triglycerides    Hyperlipidemia    Hypothyroid    OA (osteoarthritis)    right knee,hands   Overflow incontinence    PONV (postoperative nausea and vomiting)    s/p gallbladder    RLS (restless legs syndrome)    Urinary urgency    Uterine cancer ([employer]) dx'd 2014    MEDICATIONS:  Current Outpatient Medications on File Prior to Visit  Medication Sig Dispense Refill   ACCU-CHEK GUIDE test strip USE TO check blood glucose UP TO four times daily AS DIRECTED 100 strip 0   Accu-Chek Softclix Lancets lancets USE TO check blood glucose UP TO four times daily AS DIRECTED 100 each 0   anastrozole (ARIMIDEX) 1 MG tablet  Take 1 tablet (1 mg total) by mouth daily. 90 tablet 4   Ascorbic Acid (VITAMIN C PO) Take 1 tablet by mouth daily.     aspirin EC 81 MG tablet Take 81 mg by mouth daily.     atorvastatin (LIPITOR) 40 MG tablet TAKE ONE TABLET BY MOUTH EVERYDAY AT BEDTIME 90 tablet 2   bisoprolol (ZEBETA) 5 MG tablet TAKE ONE TABLET BY MOUTH EVERYDAY AT BEDTIME 90 tablet 0   blood glucose meter kit and supplies Dispense based on patient and insurance preference. Use up to four times daily as directed. (FOR ICD-10 E10.9, E11.9). 1 each 0   Calcium Citrate-Vitamin D (CALCIUM + D PO) Take 1 tablet by mouth daily with supper.     celecoxib (CELEBREX) 200 MG capsule Take 1 capsule (200 mg total) by mouth daily. (Patient taking differently: Take 200 mg by mouth 2 (two) times daily.)     CINNAMON PO Take 1 capsule by mouth daily with supper.     diclofenac sodium (VOLTAREN) 1 % GEL Apply 2-4 g topically 4 (four) times daily as needed (as directed for pain).     Dulaglutide (TRULICITY) 3 0000000 SOPN Inject 3 mg as directed once a week. 6 mL 0   empagliflozin (JARDIANCE) 25 MG TABS tablet Take 1 tablet (25 mg total) by mouth daily. 90 tablet 1   escitalopram (LEXAPRO) 20 MG tablet Take 1 tablet (20 mg total) by mouth daily. 90 tablet 1   fenofibrate (TRICOR) 145 MG tablet Take 1 tablet (145 mg total) by mouth daily. 90 tablet 1   Ferrous Sulfate (IRON PO) Take 1 tablet by mouth daily. alternates days:1 tablet one day and 2 tablets the next day     gabapentin (NEURONTIN) 600 MG tablet Take 1 tablet (600 mg total) by mouth 2 (two) times daily. 60 tablet 5   glipiZIDE (GLUCOTROL XL) 10 MG 24 hr tablet TAKE TWO TABLETS BY MOUTH EVERY MORNING 90 tablet 1   Incontinence Supply Disposable (DEPEND ADJUSTABLE UNDERWEAR) MISC 1 Application by Does not apply route daily. 16 each 11   levothyroxine (SYNTHROID) 125 MCG tablet Take 1 tablet (125 mcg total) by mouth daily. 30 tablet 2   loratadine (CLARITIN) 10 MG tablet Take 1 tablet  (10 mg total) by mouth daily as needed for allergies. 30 tablet 11   metFORMIN (GLUCOPHAGE) 500 MG tablet TAKE TWO TABLETS BY MOUTH TWICE DAILY 270 tablet 1   metroNIDAZOLE (METROGEL) 1 % gel Apply 1 application topically as needed (as directed to affected area). 45 g 2   Multiple Vitamins-Calcium (ONE-A-DAY WOMENS PO) Take 1 tablet by mouth daily.     nitroGLYCERIN (NITROSTAT) 0.4 MG SL tablet Place 1 tablet (0.4 mg total) under the tongue every 5 (five) minutes as needed for chest pain. 25 tablet 6   omeprazole (PRILOSEC) 40 MG capsule TAKE ONE CAPSULE BY MOUTH EVERY EVENING 90 capsule 1   rOPINIRole (REQUIP) 0.5 MG tablet TAKE ONE TABLET BY MOUTH EVERYDAY AT BEDTIME 90 tablet 1   Tiotropium Bromide-Olodaterol (STIOLTO RESPIMAT) 2.5-2.5 MCG/ACT AERS Inhale 2 puffs into the lungs daily. 4 g 5  traMADol (ULTRAM) 50 MG tablet Take 1 tablet (50 mg total) by mouth 3 (three) times daily as needed for moderate pain or severe pain. 90 tablet 2   triamcinolone cream (KENALOG) 0.1 % Apply 1 Application topically 2 (two) times daily. For 14 days maximum 80 g 0   TRULICITY 3 0000000 SOPN Inject THREE MG into THE SKIN as directed ONCE A WEEK. 6 mL 0   TURMERIC PO Take 1 capsule by mouth daily with supper.     No current facility-administered medications on file prior to visit.    ALLERGIES: Allergies  Allergen Reactions   Chloraprep One Step [Chlorhexidine Gluconate] Rash   Estrogens Other (See Comments)    PATIENT HAS A HISTORY OF CANCER AND HAS BEEN TOLD TO NEVER TAKE ANYTHING CONTAINING ESTROGEN, AS IT MIGHT CAUSE A RECURRENCE   Shrimp [Shellfish Allergy] Nausea And Vomiting    FAMILY HISTORY: Family History  Problem Relation Age of Onset   AAA (abdominal aortic aneurysm) Mother    Diabetes Father    Alzheimer's disease Father    Throat cancer Sister    Breast cancer Cousin       Objective:  *** General: No acute distress.  Patient appears well-groomed.   Head:   Normocephalic/atraumatic Eyes:  Fundi examined but not visualized Neck: supple, no paraspinal tenderness, full range of motion Heart:  Regular rate and rhythm Neurological Exam: ***   Metta Clines, DO  CC: [name], PA-C

## 2022-04-08 ENCOUNTER — Ambulatory Visit: Payer: Medicare HMO | Admitting: Neurology

## 2022-04-08 ENCOUNTER — Encounter: Payer: Self-pay | Admitting: Neurology

## 2022-04-08 VITALS — BP 122/72 | HR 89 | Resp 18 | Ht 64.0 in | Wt 172.0 lb

## 2022-04-08 DIAGNOSIS — R6889 Other general symptoms and signs: Secondary | ICD-10-CM | POA: Diagnosis not present

## 2022-04-08 DIAGNOSIS — G4486 Cervicogenic headache: Secondary | ICD-10-CM | POA: Diagnosis not present

## 2022-04-08 MED ORDER — GABAPENTIN 300 MG PO CAPS: ORAL_CAPSULE | ORAL | 5 refills | Status: DC

## 2022-04-08 NOTE — Patient Instructions (Signed)
Continue gabapentin 600mg  twice daily, but also take 300mg  in afternoon as well Limit use of pain relievers to no more than 2 days out of week to prevent risk of rebound or medication-overuse headache. Follow up 4 to 5 months.

## 2022-04-11 ENCOUNTER — Ambulatory Visit (INDEPENDENT_AMBULATORY_CARE_PROVIDER_SITE_OTHER): Payer: Medicare HMO

## 2022-04-11 ENCOUNTER — Other Ambulatory Visit: Payer: Self-pay

## 2022-04-11 DIAGNOSIS — Z0000 Encounter for general adult medical examination without abnormal findings: Secondary | ICD-10-CM

## 2022-04-11 NOTE — Patient Instructions (Signed)
Felicia [name] , Thank you for taking time to come for your Medicare Wellness Visit. I appreciate your ongoing commitment to your health goals. Please review the following plan we discussed and let me know if I can assist you in the future.   These are the goals we discussed:  Goals      Patient Stated     None at this time      Patient Stated     Lose weight      Track and Manage My Symptoms-Depression     Timeframe:  Long-Range Goal Priority:  High Start Date:   05/18/21                          Expected End Date:  11/18/21                     Follow Up Date 08/18/21    - develop a personal safety plan - exercise at least 2 to 3 times per week - have a plan for how to handle bad days - spend time or talk with others at least 2 to 3 times per week    Why is this important?   Keeping track of your progress will help your treatment team find the right mix of medicine and therapy for you.  Write in your journal every day.  Day-to-day changes in depression symptoms are normal. It may be more helpful to check your progress at the end of each week instead of every day.     Notes:         This is a list of the screening recommended for you and due dates:  Health Maintenance  Topic Date Due   Eye exam for diabetics  05/15/2022*   Zoster (Shingles) Vaccine (1 of 2) 06/26/2022*   Hemoglobin A1C  09/26/2022   Yearly kidney health urinalysis for diabetes  12/25/2022   Complete foot exam   12/25/2022   Yearly kidney function blood test for diabetes  03/26/2023   Medicare Annual Wellness Visit  04/11/2023   Mammogram  08/23/2023   Colon Cancer Screening  10/06/2023   DTaP/Tdap/Td vaccine (3 - Tdap) 02/07/2029   Pneumonia Vaccine  Completed   Flu Shot  Completed   DEXA scan (bone density measurement)  Completed   Hepatitis C Screening: USPSTF Recommendation to screen - Ages 17-79 yo.  Completed   HPV Vaccine  Aged Out   COVID-19 Vaccine  Discontinued  *Topic was postponed. The date  shown is not the original due date.    Advanced directives: Please bring a copy of your health care power of attorney and living will to the office at your convenience.  Conditions/risks identified: lose weight   Next appointment: Follow up in one year for your annual wellness visit    Preventive Care 65 Years and Older, Female Preventive care refers to lifestyle choices and visits with your health care provider that can promote health and wellness. What does preventive care include? A yearly physical exam. This is also called an annual well check. Dental exams once or twice a year. Routine eye exams. Ask your health care provider how often you should have your eyes checked. Personal lifestyle choices, including: Daily care of your teeth and gums. Regular physical activity. Eating a healthy diet. Avoiding tobacco and drug use. Limiting alcohol use. Practicing safe sex. Taking low-dose aspirin every day. Taking vitamin and mineral supplements as recommended by your  health care provider. What happens during an annual well check? The services and screenings done by your health care provider during your annual well check will depend on your age, overall health, lifestyle risk factors, and family history of disease. Counseling  Your health care provider may ask you questions about your: Alcohol use. Tobacco use. Drug use. Emotional well-being. Home and relationship well-being. Sexual activity. Eating habits. History of falls. Memory and ability to understand (cognition). Work and work Statistician. Reproductive health. Screening  You may have the following tests or measurements: Height, weight, and BMI. Blood pressure. Lipid and cholesterol levels. These may be checked every 5 years, or more frequently if you are over 48 years old. Skin check. Lung cancer screening. You may have this screening every year starting at age 32 if you have a 30-pack-year history of smoking and  currently smoke or have quit within the past 15 years. Fecal occult blood test (FOBT) of the stool. You may have this test every year starting at age 73. Flexible sigmoidoscopy or colonoscopy. You may have a sigmoidoscopy every 5 years or a colonoscopy every 10 years starting at age 5. Hepatitis C blood test. Hepatitis B blood test. Sexually transmitted disease (STD) testing. Diabetes screening. This is done by checking your blood sugar (glucose) after you have not eaten for a while (fasting). You may have this done every 1-3 years. Bone density scan. This is done to screen for osteoporosis. You may have this done starting at age 94. Mammogram. This may be done every 1-2 years. Talk to your health care provider about how often you should have regular mammograms. Talk with your health care provider about your test results, treatment options, and if necessary, the need for more tests. Vaccines  Your health care provider may recommend certain vaccines, such as: Influenza vaccine. This is recommended every year. Tetanus, diphtheria, and acellular pertussis (Tdap, Td) vaccine. You may need a Td booster every 10 years. Zoster vaccine. You may need this after age 88. Pneumococcal 13-valent conjugate (PCV13) vaccine. One dose is recommended after age 12. Pneumococcal polysaccharide (PPSV23) vaccine. One dose is recommended after age 18. Talk to your health care provider about which screenings and vaccines you need and how often you need them. This information is not intended to replace advice given to you by your health care provider. Make sure you discuss any questions you have with your health care provider. Document Released: 01/27/2015 Document Revised: 09/20/2015 Document Reviewed: 11/01/2014 Elsevier Interactive Patient Education  2017 [place] Prevention in the Home Falls can cause injuries. They can happen to people of all ages. There are many things you can do to make your home  safe and to help prevent falls. What can I do on the outside of my home? Regularly fix the edges of walkways and driveways and fix any cracks. Remove anything that might make you trip as you walk through a door, such as a raised step or threshold. Trim any bushes or trees on the path to your home. Use bright outdoor lighting. Clear any walking paths of anything that might make someone trip, such as rocks or tools. Regularly check to see if handrails are loose or broken. Make sure that both sides of any steps have handrails. Any raised decks and porches should have guardrails on the edges. Have any leaves, snow, or ice cleared regularly. Use sand or salt on walking paths during winter. Clean up any spills in your garage right away. This includes  oil or grease spills. What can I do in the bathroom? Use night lights. Install grab bars by the toilet and in the tub and shower. Do not use towel bars as grab bars. Use non-skid mats or decals in the tub or shower. If you need to sit down in the shower, use a plastic, non-slip stool. Keep the floor dry. Clean up any water that spills on the floor as soon as it happens. Remove soap buildup in the tub or shower regularly. Attach bath mats securely with double-sided non-slip rug tape. Do not have throw rugs and other things on the floor that can make you trip. What can I do in the bedroom? Use night lights. Make sure that you have a light by your bed that is easy to reach. Do not use any sheets or blankets that are too big for your bed. They should not hang down onto the floor. Have a firm chair that has side arms. You can use this for support while you get dressed. Do not have throw rugs and other things on the floor that can make you trip. What can I do in the kitchen? Clean up any spills right away. Avoid walking on wet floors. Keep items that you use a lot in easy-to-reach places. If you need to reach something above you, use a strong step  stool that has a grab bar. Keep electrical cords out of the way. Do not use floor polish or wax that makes floors slippery. If you must use wax, use non-skid floor wax. Do not have throw rugs and other things on the floor that can make you trip. What can I do with my stairs? Do not leave any items on the stairs. Make sure that there are handrails on both sides of the stairs and use them. Fix handrails that are broken or loose. Make sure that handrails are as long as the stairways. Check any carpeting to make sure that it is firmly attached to the stairs. Fix any carpet that is loose or worn. Avoid having throw rugs at the top or bottom of the stairs. If you do have throw rugs, attach them to the floor with carpet tape. Make sure that you have a light switch at the top of the stairs and the bottom of the stairs. If you do not have them, ask someone to add them for you. What else can I do to help prevent falls? Wear shoes that: Do not have high heels. Have rubber bottoms. Are comfortable and fit you well. Are closed at the toe. Do not wear sandals. If you use a stepladder: Make sure that it is fully opened. Do not climb a closed stepladder. Make sure that both sides of the stepladder are locked into place. Ask someone to hold it for you, if possible. Clearly mark and make sure that you can see: Any grab bars or handrails. First and last steps. Where the edge of each step is. Use tools that help you move around (mobility aids) if they are needed. These include: Canes. Walkers. Scooters. Crutches. Turn on the lights when you go into a dark area. Replace any light bulbs as soon as they burn out. Set up your furniture so you have a clear path. Avoid moving your furniture around. If any of your floors are uneven, fix them. If there are any pets around you, be aware of where they are. Review your medicines with your doctor. Some medicines can make you feel dizzy. This can  increase your  chance of falling. Ask your doctor what other things that you can do to help prevent falls. This information is not intended to replace advice given to you by your health care provider. Make sure you discuss any questions you have with your health care provider. Document Released: 10/27/2008 Document Revised: 06/08/2015 Document Reviewed: 02/04/2014 Elsevier Interactive Patient Education  2017 [employer].

## 2022-04-11 NOTE — Telephone Encounter (Signed)
Per pharmacy 600 mg Gabapentin needs to be refilled.

## 2022-04-11 NOTE — Progress Notes (Signed)
I connected with  Felicia [name] on 04/11/22 by a audio enabled telemedicine application and verified that I am speaking with the correct person using two identifiers.  Patient Location: Home  Provider Location: Office/Clinic  I discussed the limitations of evaluation and management by telemedicine. The patient expressed understanding and agreed to proceed.   Subjective:   [name] is a 72 y.o. female who presents for Medicare Annual (Subsequent) preventive examination.  Review of Systems     Cardiac Risk Factors include: advanced age (>7men, >44 women);diabetes mellitus;dyslipidemia     Objective:    There were no vitals filed for this visit. There is no height or weight on file to calculate BMI.     04/11/2022    1:40 PM 02/06/2022    1:12 PM 10/31/2021    2:16 PM 06/27/2021    7:51 PM 04/06/2021    1:27 PM 11/16/2020   11:00 AM 08/25/2020   12:13 PM  Advanced Directives  Does Patient Have a Medical Advance Directive? No No No No Yes No No  Does patient want to make changes to medical advance directive?     Yes (MAU/Ambulatory/Procedural Areas - Information given)    Would patient like information on creating a medical advance directive? No - Patient declined No - Patient declined  No - Patient declined  No - Patient declined No - Patient declined    Current Medications (verified) Outpatient Encounter Medications as of 04/11/2022  Medication Sig   ACCU-CHEK GUIDE test strip USE TO check blood glucose UP TO four times daily AS DIRECTED   Accu-Chek Softclix Lancets lancets USE TO check blood glucose UP TO four times daily AS DIRECTED   anastrozole (ARIMIDEX) 1 MG tablet Take 1 tablet (1 mg total) by mouth daily.   Ascorbic Acid (VITAMIN C PO) Take 1 tablet by mouth daily.   aspirin EC 81 MG tablet Take 81 mg by mouth daily.   atorvastatin (LIPITOR) 40 MG tablet TAKE ONE TABLET BY MOUTH EVERYDAY AT BEDTIME   bisoprolol (ZEBETA) 5 MG tablet TAKE ONE TABLET BY MOUTH EVERYDAY  AT BEDTIME   blood glucose meter kit and supplies Dispense based on patient and insurance preference. Use up to four times daily as directed. (FOR ICD-10 E10.9, E11.9).   Calcium Citrate-Vitamin D (CALCIUM + D PO) Take 1 tablet by mouth daily with supper.   celecoxib (CELEBREX) 200 MG capsule Take 1 capsule (200 mg total) by mouth daily. (Patient taking differently: Take 200 mg by mouth 2 (two) times daily.)   CINNAMON PO Take 1 capsule by mouth daily with supper.   diclofenac sodium (VOLTAREN) 1 % GEL Apply 2-4 g topically 4 (four) times daily as needed (as directed for pain).   Dulaglutide (TRULICITY) 3 0000000 SOPN Inject 3 mg as directed once a week.   empagliflozin (JARDIANCE) 25 MG TABS tablet Take 1 tablet (25 mg total) by mouth daily.   escitalopram (LEXAPRO) 20 MG tablet Take 1 tablet (20 mg total) by mouth daily.   fenofibrate (TRICOR) 145 MG tablet Take 1 tablet (145 mg total) by mouth daily.   Ferrous Sulfate (IRON PO) Take 1 tablet by mouth daily. alternates days:1 tablet one day and 2 tablets the next day   gabapentin (NEURONTIN) 300 MG capsule Take 300mg  every afternoon.   gabapentin (NEURONTIN) 600 MG tablet Take 1 tablet (600 mg total) by mouth 2 (two) times daily.   glipiZIDE (GLUCOTROL XL) 10 MG 24 hr tablet TAKE TWO TABLETS BY MOUTH EVERY  MORNING   Incontinence Supply Disposable (DEPEND ADJUSTABLE UNDERWEAR) MISC 1 Application by Does not apply route daily.   levothyroxine (SYNTHROID) 125 MCG tablet Take 1 tablet (125 mcg total) by mouth daily.   loratadine (CLARITIN) 10 MG tablet Take 1 tablet (10 mg total) by mouth daily as needed for allergies.   metFORMIN (GLUCOPHAGE) 500 MG tablet TAKE TWO TABLETS BY MOUTH TWICE DAILY   metroNIDAZOLE (METROGEL) 1 % gel Apply 1 application topically as needed (as directed to affected area).   Multiple Vitamins-Calcium (ONE-A-DAY WOMENS PO) Take 1 tablet by mouth daily.   nitroGLYCERIN (NITROSTAT) 0.4 MG SL tablet Place 1 tablet (0.4 mg  total) under the tongue every 5 (five) minutes as needed for chest pain.   omeprazole (PRILOSEC) 40 MG capsule TAKE ONE CAPSULE BY MOUTH EVERY EVENING   rOPINIRole (REQUIP) 0.5 MG tablet TAKE ONE TABLET BY MOUTH EVERYDAY AT BEDTIME   Tiotropium Bromide-Olodaterol (STIOLTO RESPIMAT) 2.5-2.5 MCG/ACT AERS Inhale 2 puffs into the lungs daily.   traMADol (ULTRAM) 50 MG tablet Take 1 tablet (50 mg total) by mouth 3 (three) times daily as needed for moderate pain or severe pain.   triamcinolone cream (KENALOG) 0.1 % Apply 1 Application topically 2 (two) times daily. For 14 days maximum   TURMERIC PO Take 1 capsule by mouth daily with supper.   [DISCONTINUED] TRULICITY 3 0000000 SOPN Inject THREE MG into THE SKIN as directed ONCE A WEEK.   No facility-administered encounter medications on file as of 04/11/2022.    Allergies (verified) Chloraprep one step [chlorhexidine gluconate], Estrogens, and Shrimp [shellfish allergy]   History: Past Medical History:  Diagnosis Date   Anemia    Anxiety    Breast cancer ([place]) 09/2019   left breast IMC   CAD (coronary artery disease)    a. 3/2007s/p DES to the LAD [name]); b. 01/2017 MV: EF 80%, small, mild apical ant defect w/o ischemia (felt to be breast atten). Low risk.   Depression    DM (diabetes mellitus) ([place])    Essential hypertension    GERD (gastroesophageal reflux disease)    Headache    secondary to neck surgery per patient   High triglycerides    Hyperlipidemia    Hypothyroid    OA (osteoarthritis)    right knee,hands   Overflow incontinence    PONV (postoperative nausea and vomiting)    s/p gallbladder    RLS (restless legs syndrome)    Urinary urgency    Uterine cancer ([place]) dx'd 2014   Past Surgical History:  Procedure Laterality Date   BREAST LUMPECTOMY WITH RADIOACTIVE SEED AND SENTINEL LYMPH NODE BIOPSY Left 10/07/2019   Procedure: LEFT BREAST LUMPECTOMY WITH RADIOACTIVE SEED AND SENTINEL LYMPH NODE BIOPSY;  Surgeon:  Rolm Bookbinder, MD;  Location: [facility];  Service: General;  Laterality: Left;   CORONARY STENT PLACEMENT     CORONARY/GRAFT ACUTE MI REVASCULARIZATION     FINGER ARTHRODESIS Right 06/11/2019   Procedure: ARTHRODESIS INDEX FINGER DISTAL PHALANGEAL JOINT;  Surgeon: [name], MD;  Location: [facility];  Service: Orthopedics;  Laterality: Right;   FOOT SURGERY Left    LEFT HEART CATH AND CORONARY ANGIOGRAPHY N/A 02/16/2018   Procedure: LEFT HEART CATH AND CORONARY ANGIOGRAPHY;  Surgeon: Lorretta Harp, MD;  Location: [facility];  Service: Cardiovascular;  Laterality: N/A;   LEFT HEART CATH AND CORONARY ANGIOGRAPHY N/A 05/01/2020   Procedure: LEFT HEART CATH AND CORONARY ANGIOGRAPHY;  Surgeon: Lorretta Harp,  MD;  Location: [facility];  Service: Cardiovascular;  Laterality: N/A;   PORTACATH PLACEMENT Right 11/23/2019   Procedure: INSERTION PORT-A-CATH WITH ULTRASOUND GUIDANCE;  Surgeon: Rolm Bookbinder, MD;  Location: [facility];  Service: General;  Laterality: Right;   REPLACEMENT TOTAL KNEE Right    SPINE SURGERY     TOTAL ABDOMINAL HYSTERECTOMY     FOR UTERUS CANCER   TOTAL HIP ARTHROPLASTY Left 2015   Family History  Problem Relation Age of Onset   AAA (abdominal aortic aneurysm) Mother    Diabetes Father    Alzheimer's disease Father    Throat cancer Sister    Breast cancer Cousin    Social History   Socioeconomic History   Marital status: Divorced    Spouse name: Not on file   Number of children: Not on file   Years of education: Not on file   Highest education level: Not on file  Occupational History   Not on file  Tobacco Use   Smoking status: Never   Smokeless tobacco: Never  Vaping Use   Vaping Use: Never used  Substance and Sexual Activity   Alcohol use: Yes    Comment: OCCASIONALLY   Drug use: No   Sexual activity: Not Currently    Birth control/protection: Surgical    Comment: hyst   Other Topics Concern   Not on file  Social History Narrative   Originally from [place], moved here in 2018.    Right handed   Drinks decaf   One floor house   Social Determinants of Health   Financial Resource Strain: Low Risk  (04/11/2022)   Overall Financial Resource Strain (CARDIA)    Difficulty of Paying Living Expenses: Not hard at all  Food Insecurity: No Food Insecurity (04/11/2022)   Hunger Vital Sign    Worried About Running Out of Food in the Last Year: Never true    Ran Out of Food in the Last Year: Never true  Transportation Needs: No Transportation Needs (04/11/2022)   PRAPARE - Hydrologist (Medical): No    Lack of Transportation (Non-Medical): No  Physical Activity: Inactive (04/11/2022)   Exercise Vital Sign    Days of Exercise per Week: 0 days    Minutes of Exercise per Session: 0 min  Stress: Stress Concern Present (04/11/2022)   [place]    Feeling of Stress : To some extent  Social Connections: Socially Isolated (04/11/2022)   Social Connection and Isolation Panel [NHANES]    Frequency of Communication with Friends and Family: Once a week    Frequency of Social Gatherings with Friends and Family: Three times a week    Attends Religious Services: Never    Active Member of Clubs or Organizations: No    Attends Archivist Meetings: Never    Marital Status: Divorced    Tobacco Counseling Counseling given: Not Answered   Clinical Intake:  Pre-visit preparation completed: Yes  Pain : No/denies pain     BMI - recorded: 29.52 Nutritional Status: BMI 25 -29 Overweight Nutritional Risks: None Diabetes: Yes CBG done?: No Did pt. bring in CBG monitor from home?: No  How often do you need to have someone help you when you read instructions, pamphlets, or other written materials from your doctor or pharmacy?: 1 - Never  Diabetic?Nutrition Risk  Assessment:  Has the patient had any N/V/D within the last 2 months?  No  Does the patient have any non-healing wounds?  No  Has the patient had any unintentional weight loss or weight gain?  No   Diabetes:  Is the patient diabetic?  Yes  If diabetic, was a CBG obtained today?  No  Did the patient bring in their glucometer from home?  No  How often do you monitor your CBG's? N/a.   Financial Strains and Diabetes Management:  Are you having any financial strains with the device, your supplies or your medication? No .  Does the patient want to be seen by Chronic Care Management for management of their diabetes?  No  Would the patient like to be referred to a Nutritionist or for Diabetic Management?  No   Diabetic Exams:  Diabetic Eye Exam: Completed 04/05/20 Diabetic Foot Exam: Completed 12/24/21   Interpreter Needed?: No  Information entered by :: Charlott Rakes, LPN   Activities of Daily Living    04/11/2022    1:45 PM  In your present state of health, do you have any difficulty performing the following activities:  Hearing? 0  Vision? 0  Difficulty concentrating or making decisions? 0  Walking or climbing stairs? 0  Dressing or bathing? 0  Doing errands, shopping? 0  Preparing Food and eating ? N  Using the Toilet? N  In the past six months, have you accidently leaked urine? Y  Comment wears brief  Do you have problems with loss of bowel control? Y  Managing your Medications? N  Managing your Finances? N  Housekeeping or managing your Housekeeping? N    Patient Care Team: Allwardt, Randa Evens, PA-C as PCP - General (Physician Assistant) Nahser, [name], MD as PCP - Cardiology (Cardiology) Rolm Bookbinder, MD as Consulting Physician (General Surgery) Eppie Gibson, MD as Attending Physician (Radiation Oncology) [name], MD as Consulting Physician (Rheumatology) [name], MD as Consulting Physician (Orthopedic Surgery) Madelin Rear, MD as  Consulting Physician (Endocrinology) Margaretha Seeds, MD as Consulting Physician (Pulmonary Disease) Nicholas Lose, MD as Consulting Physician (Hematology and Oncology) Edythe Clarity, [facility] as Pharmacist (Pharmacist)  Indicate any recent Medical Services you may have received from other than [facility] providers in the past year (date may be approximate).     Assessment:   This is a routine wellness examination for [place].  Hearing/Vision screen Hearing Screening - Comments:: Pt denies any hearing issues  Vision Screening - Comments:: Pt follows up with [facility] for annual eye exams   Dietary issues and exercise activities discussed: Current Exercise Habits: The patient does not participate in regular exercise at present   Goals Addressed             This Visit's Progress    Patient Stated       Lose weight        Depression Screen    04/11/2022    1:37 PM 03/26/2022   10:23 AM 02/28/2022   11:35 AM 04/06/2021    1:25 PM 01/18/2021    1:52 PM 09/08/2019   12:36 PM 12/02/2018    2:29 PM  PHQ 2/9 Scores  PHQ - 2 Score 2 2 2 1 1  0 0  PHQ- 9 Score 9 11 10         Fall Risk    04/11/2022    1:44 PM 03/26/2022   10:22 AM 02/28/2022   11:35 AM 10/31/2021    2:16 PM 05/31/2021   10:46 AM  Fall Risk   Falls in the past year?  1 1 1 1 1   Number falls in past yr: 1 0 0 0 0  Injury with Fall? 0 0 0 0 0  Risk for fall due to : Impaired vision;Impaired mobility;Impaired balance/gait No Fall Risks No Fall Risks  Impaired balance/gait  Follow up Falls prevention discussed Falls evaluation completed Falls evaluation completed Falls evaluation completed Falls evaluation completed    [place]:  Any stairs in or around the home? No  If so, are there any without handrails? No  Home free of loose throw rugs in walkways, pet beds, electrical cords, etc? Yes  Adequate lighting in your home to reduce risk of falls? Yes   ASSISTIVE DEVICES UTILIZED TO  PREVENT FALLS:  Life alert? Yes  Use of a cane, walker or w/c? Yes  Grab bars in the bathroom? Yes  Shower chair or bench in shower? Yes  Elevated toilet seat or a handicapped toilet? Yes   TIMED UP AND GO:  Was the test performed? No .  Cognitive Function:        04/11/2022    1:48 PM 04/06/2021    1:31 PM  6CIT Screen  What Year? 0 points 0 points  What month? 0 points 0 points  What time? 0 points 0 points  Count back from 20 0 points 0 points  Months in reverse 0 points 0 points  Repeat phrase 0 points 0 points  Total Score 0 points 0 points    Immunizations Immunization History  Administered Date(s) Administered   Fluad Quad(high Dose 65+) 09/25/2021   Influenza, High Dose Seasonal PF 12/31/2016, 10/24/2017   PNEUMOCOCCAL CONJUGATE-20 09/25/2021   Pneumococcal Conjugate-13 10/24/2017   Td 07/12/2005, 02/08/2019    TDAP status: Up to date  Flu Vaccine status: Up to date  Pneumococcal vaccine status: Up to date  Covid-19 vaccine status: Declined, Education has been provided regarding the importance of this vaccine but patient still declined. Advised may receive this vaccine at local pharmacy or Health Dept.or vaccine clinic. Aware to provide a copy of the vaccination record if obtained from local pharmacy or Health Dept. Verbalized acceptance and understanding.  Qualifies for Shingles Vaccine? Yes   Zostavax completed No   Shingrix Completed?: No.    Education has been provided regarding the importance of this vaccine. Patient has been advised to call insurance company to determine out of pocket expense if they have not yet received this vaccine. Advised may also receive vaccine at local pharmacy or Health Dept. Verbalized acceptance and understanding.  Screening Tests Health Maintenance  Topic Date Due   OPHTHALMOLOGY EXAM  05/15/2022 (Originally 04/05/2021)   Zoster Vaccines- Shingrix (1 of 2) 06/26/2022 (Originally 01/03/1970)   HEMOGLOBIN A1C  09/26/2022    Diabetic kidney evaluation - Urine ACR  12/25/2022   FOOT EXAM  12/25/2022   Diabetic kidney evaluation - eGFR measurement  03/26/2023   Medicare Annual Wellness (AWV)  04/11/2023   MAMMOGRAM  08/23/2023   COLONOSCOPY (Pts 45-80yrs Insurance coverage will need to be confirmed)  10/06/2023   DTaP/Tdap/Td (3 - Tdap) 02/07/2029   Pneumonia Vaccine 28+ Years old  Completed   INFLUENZA VACCINE  Completed   DEXA SCAN  Completed   Hepatitis C Screening  Completed   HPV VACCINES  Aged Out   COVID-19 Vaccine  Discontinued    Health Maintenance  There are no preventive care reminders to display for this patient.   Colorectal cancer screening: Type of screening: Colonoscopy. Completed 10/05/13.  Repeat every 10 years  Mammogram status: Completed 08/22/21. Repeat every year  Bone Density status: Completed 03/19/21. Results reflect: Bone density results: OSTEOPENIA. Repeat every 2 years.  Additional Screening:  Hepatitis C Screening:  Completed 11/04/18  Vision Screening: Recommended annual ophthalmology exams for early detection of glaucoma and other disorders of the eye. Is the patient up to date with their annual eye exam?  Yes  Who is the provider or what is the name of the office in which the patient attends annual eye exams? [facility]  If pt is not established with a provider, would they like to be referred to a provider to establish care? No .   Dental Screening: Recommended annual dental exams for proper oral hygiene  Community Resource Referral / Chronic Care Management: CRR required this visit?  No   CCM required this visit?  No      Plan:     I have personally reviewed and noted the following in the patient's chart:   Medical and social history Use of alcohol, tobacco or illicit drugs  Current medications and supplements including opioid prescriptions. Patient is currently taking opioid prescriptions. Information provided to patient regarding non-opioid alternatives.  Patient advised to discuss non-opioid treatment plan with their provider. Functional ability and status Nutritional status Physical activity Advanced directives List of other physicians Hospitalizations, surgeries, and ER visits in previous 12 months Vitals Screenings to include cognitive, depression, and falls Referrals and appointments  In addition, I have reviewed and discussed with patient certain preventive protocols, quality metrics, and best practice recommendations. A written personalized care plan for preventive services as well as general preventive health recommendations were provided to patient.     Willette Brace, LPN   [id number]   Nurse Notes: none

## 2022-04-14 MED ORDER — GABAPENTIN 600 MG PO TABS: 600.0000 mg | ORAL_TABLET | Freq: Two times a day (BID) | ORAL | 5 refills | Status: DC

## 2022-04-18 ENCOUNTER — Ambulatory Visit: Payer: Medicare HMO | Admitting: Podiatry

## 2022-04-18 DIAGNOSIS — M2141 Flat foot [pes planus] (acquired), right foot: Secondary | ICD-10-CM

## 2022-04-18 DIAGNOSIS — B351 Tinea unguium: Secondary | ICD-10-CM | POA: Diagnosis not present

## 2022-04-18 DIAGNOSIS — M2142 Flat foot [pes planus] (acquired), left foot: Secondary | ICD-10-CM | POA: Diagnosis not present

## 2022-04-18 DIAGNOSIS — M76821 Posterior tibial tendinitis, right leg: Secondary | ICD-10-CM | POA: Diagnosis not present

## 2022-04-18 DIAGNOSIS — R6889 Other general symptoms and signs: Secondary | ICD-10-CM | POA: Diagnosis not present

## 2022-04-18 DIAGNOSIS — M79675 Pain in left toe(s): Secondary | ICD-10-CM | POA: Diagnosis not present

## 2022-04-18 DIAGNOSIS — M76829 Posterior tibial tendinitis, unspecified leg: Secondary | ICD-10-CM

## 2022-04-18 DIAGNOSIS — E1142 Type 2 diabetes mellitus with diabetic polyneuropathy: Secondary | ICD-10-CM | POA: Diagnosis not present

## 2022-04-18 DIAGNOSIS — M79674 Pain in right toe(s): Secondary | ICD-10-CM

## 2022-04-18 NOTE — Progress Notes (Signed)
  Subjective:  Patient ID: Felicia [name], female    DOB: 17-Nov-1950,  MRN: [id number]  Chief Complaint  Patient presents with   Nail Problem    DFC/Evaluation A1C-7.4 Patient would like diabetic shoes.     72 y.o. female presents with the above complaint. History confirmed with patient. Patient presenting with pain related to dystrophic thickened elongated nails. Patient is unable to trim own nails related to nail dystrophy and/or mobility issues. Patient does have a history of T2DM with neuropathy. Patient does had pain on the inside of the right medial foot.  Previously diagnosed with posterior tibial tendinitis.  She is interested in diabetic shoes and inserts.  Does have flatfoot deformity.  Objective:  Physical Exam: warm, good capillary refill nail exam onychomycosis of the toenails, onycholysis, and dystrophic nails DP pulses palpable, PT pulses palpable, and protective sensation absent Left Foot:  Pain with palpation of nails due to elongation and dystrophic growth.  Pes planus foot deformity with collapse of the medial longitudinal arch Right Foot: Pain with palpation of nails due to elongation and dystrophic growth.  Pain along the course the posterior tibial tendon on the right medial rear foot.  Mild edema at this area.  Patient does have pes planus foot deformity  Assessment:   1. Pain due to onychomycosis of toenails of both feet   2. PTTD (posterior tibial tendon dysfunction)   3. Pes planus of both feet   4. DM type 2 with diabetic peripheral neuropathy      Plan:  Patient was evaluated and treated and all questions answered.  # Posterior tibial tendinitis of the right medial foot -Discussed with patient she does have evidence of posterior tibial tendinitis. -She has previously been diagnosed with this and been in a cam boot which has helped slightly.  Pain has decreased however still having a lot of discomfort specially after walking. -Recommend steroid injection.   Patient was agreeable.  After sterile prep with alcohol swab proceeded with injection of 1 cc half percent Marcaine plain with 1 cc Kenalog 10 into the right medial rear foot along the course the posterior tibial tendon  # DM2 with neuropathy and pes planus -Recommend diabetic shoes and inserts to be fabricated for the patient.  She says she has a person who is going to come to her house to fit her for these.  Will place order and patient will need the order faxed to this individual. Patient educated on diabetes. Discussed proper diabetic foot care and discussed risks and complications of disease. Educated patient in depth on reasons to return to the office immediately should he/she discover anything concerning or new on the feet. All questions answered. Discussed proper shoes as well.    #Onychomycosis with pain  -Nails palliatively debrided as below. -Educated on self-care  Procedure: Nail Debridement Rationale: Pain Type of Debridement: manual, sharp debridement. Instrumentation: Nail nipper, rotary burr. Number of Nails: 10  Return in about 3 months (around 07/18/2022) for [facility].         Everitt Amber, DPM [facility]

## 2022-04-18 NOTE — Addendum Note (Signed)
Addended by: Ames Coupe F on: 04/18/2022 01:48 PM   Modules accepted: Orders

## 2022-04-20 IMAGING — DX DG HIP (WITH OR WITHOUT PELVIS) 5+V BILAT
5 series · 5 of 5 positions shown · non-contrast
Comparison: None

CLINICAL DATA: Bilateral hip pain for several weeks, no known
injury, initial encounter

EXAM:
DG HIP (WITH OR WITHOUT PELVIS) 5+V BILAT

[pelvis ap]
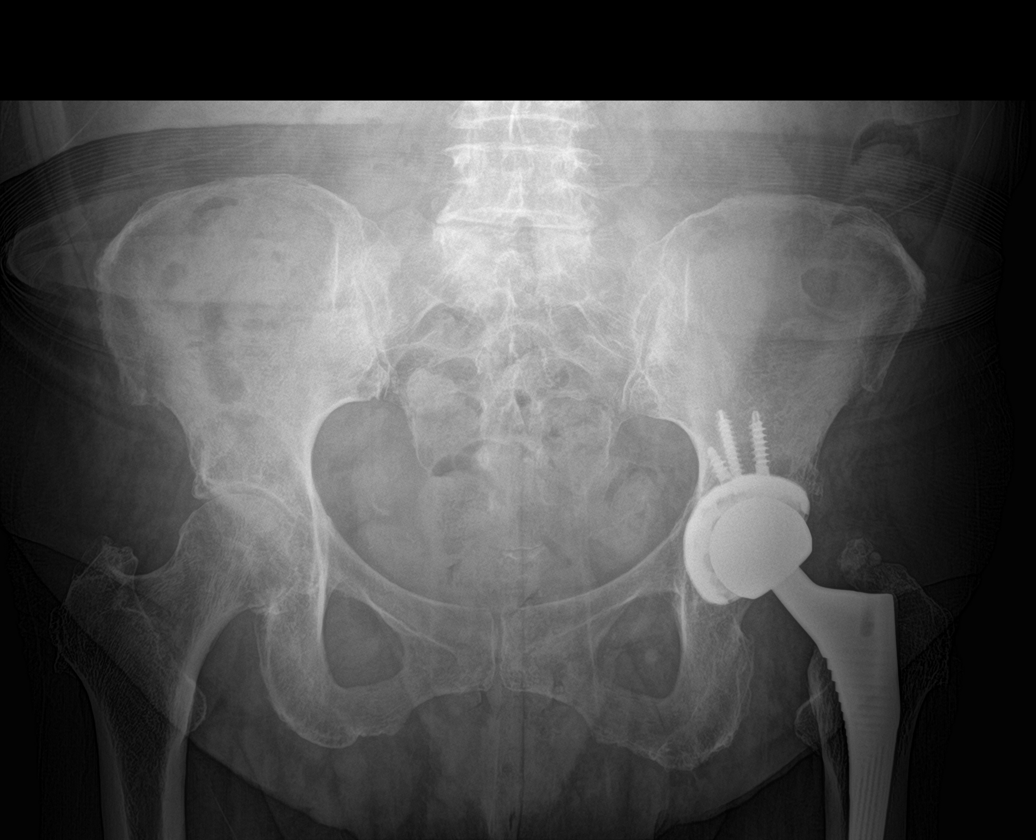

[hip ap (1 of 2)]
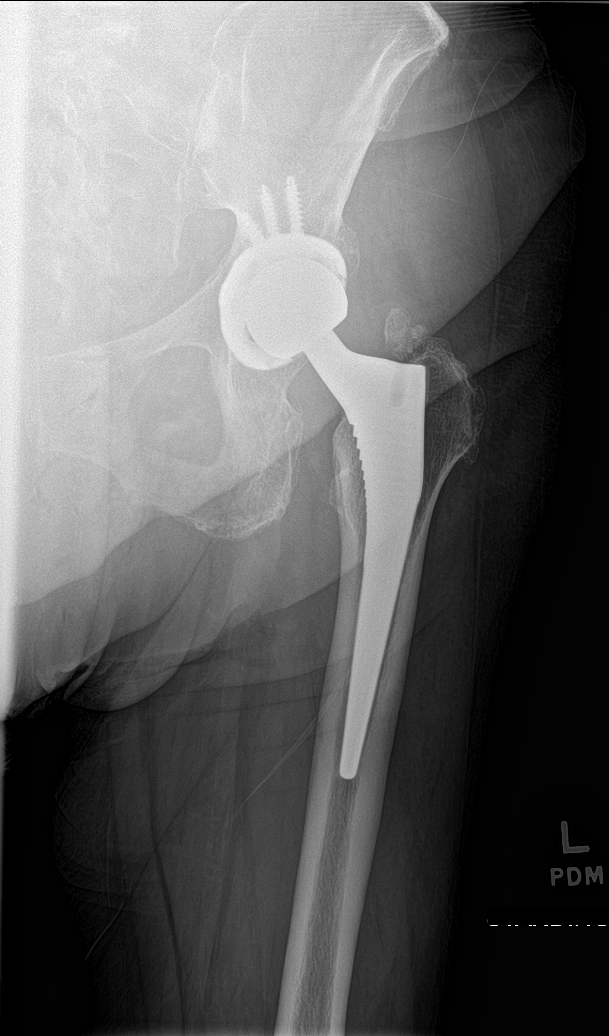

[hip lat (1 of 2)]
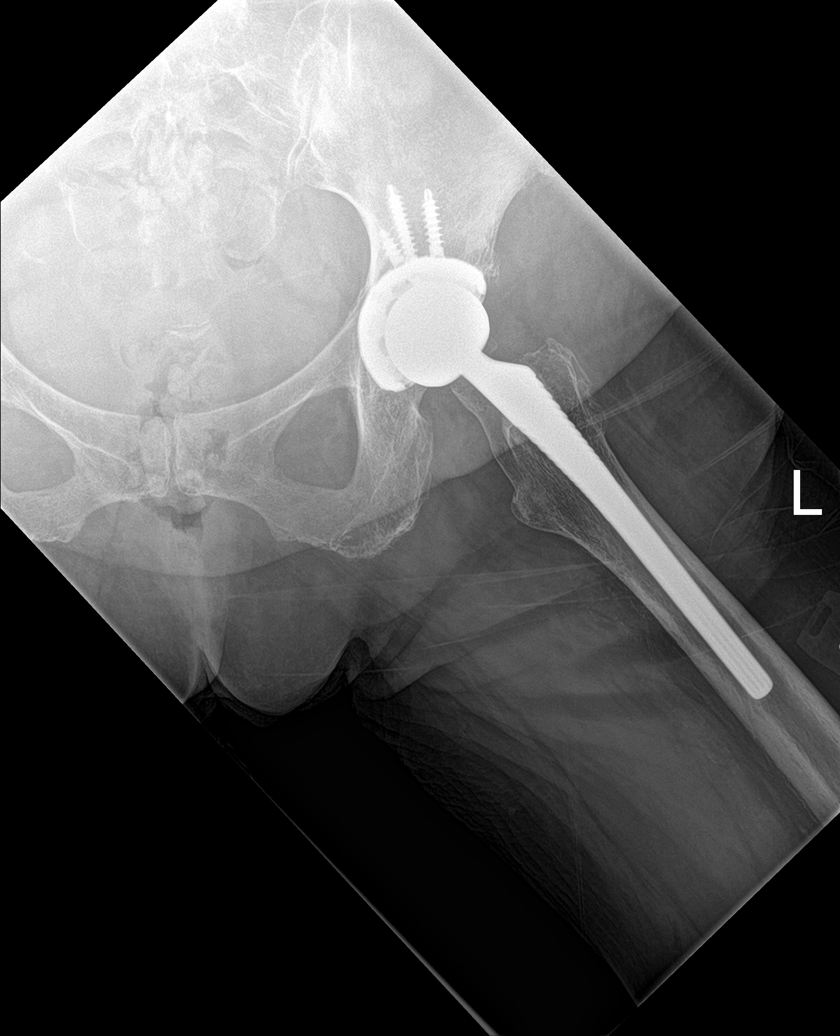

[hip ap (2 of 2)]
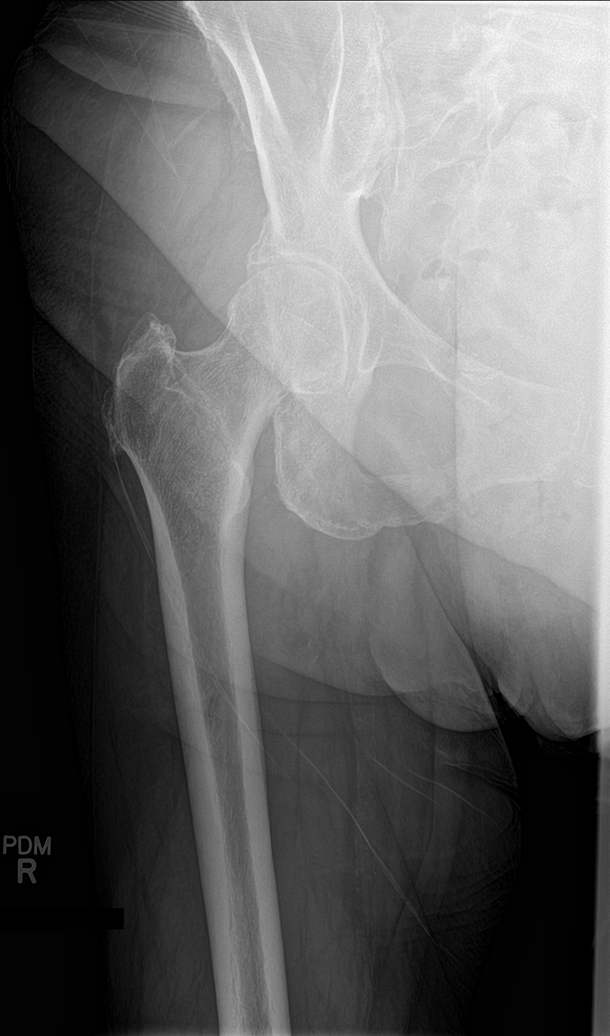

[hip lat (2 of 2)]
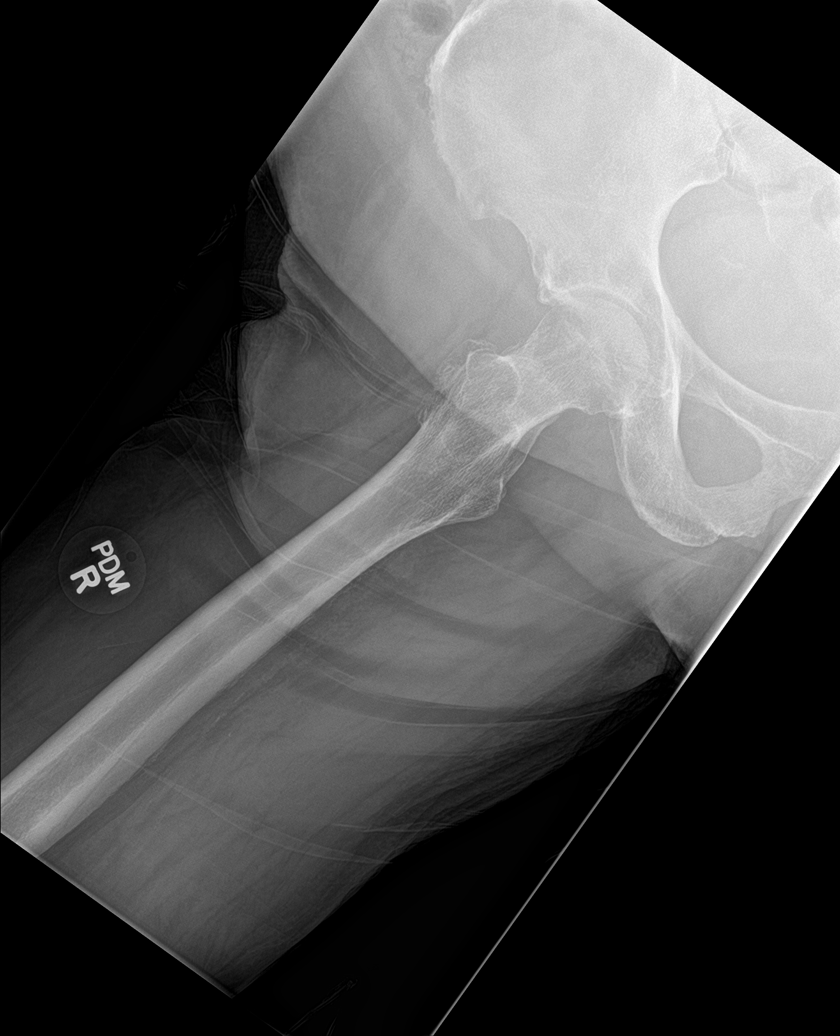

[5 of 5 positions shown; findings below may reference images not displayed]

FINDINGS: Pelvic ring is intact. Left hip replacement is noted in satisfactory
position. Mild degenerative changes of the right hip are seen. No
acute fracture or dislocation is noted. No soft tissue abnormality
is seen.
IMPRESSION: No acute abnormality noted.

## 2022-04-23 ENCOUNTER — Other Ambulatory Visit: Payer: Self-pay | Admitting: Cardiovascular Disease

## 2022-04-23 ENCOUNTER — Other Ambulatory Visit: Payer: Self-pay | Admitting: Physician Assistant

## 2022-04-23 DIAGNOSIS — R079 Chest pain, unspecified: Secondary | ICD-10-CM

## 2022-04-24 ENCOUNTER — Other Ambulatory Visit: Payer: Self-pay | Admitting: Physician Assistant

## 2022-04-24 DIAGNOSIS — H40053 Ocular hypertension, bilateral: Secondary | ICD-10-CM | POA: Diagnosis not present

## 2022-04-24 DIAGNOSIS — R6889 Other general symptoms and signs: Secondary | ICD-10-CM | POA: Diagnosis not present

## 2022-04-24 DIAGNOSIS — H2513 Age-related nuclear cataract, bilateral: Secondary | ICD-10-CM | POA: Diagnosis not present

## 2022-04-24 DIAGNOSIS — E119 Type 2 diabetes mellitus without complications: Secondary | ICD-10-CM | POA: Diagnosis not present

## 2022-04-24 DIAGNOSIS — D3132 Benign neoplasm of left choroid: Secondary | ICD-10-CM | POA: Diagnosis not present

## 2022-04-24 LAB — HM DIABETES EYE EXAM

## 2022-04-25 ENCOUNTER — Other Ambulatory Visit: Payer: Self-pay | Admitting: Physician Assistant

## 2022-04-26 DIAGNOSIS — H524 Presbyopia: Secondary | ICD-10-CM | POA: Diagnosis not present

## 2022-04-26 DIAGNOSIS — H52209 Unspecified astigmatism, unspecified eye: Secondary | ICD-10-CM | POA: Diagnosis not present

## 2022-04-26 DIAGNOSIS — H5213 Myopia, bilateral: Secondary | ICD-10-CM | POA: Diagnosis not present

## 2022-05-03 ENCOUNTER — Ambulatory Visit (HOSPITAL_BASED_OUTPATIENT_CLINIC_OR_DEPARTMENT_OTHER): Payer: Medicare HMO | Admitting: Pulmonary Disease

## 2022-05-03 ENCOUNTER — Encounter (HOSPITAL_BASED_OUTPATIENT_CLINIC_OR_DEPARTMENT_OTHER): Payer: Self-pay | Admitting: Pulmonary Disease

## 2022-05-03 VITALS — BP 110/60 | HR 85 | Ht 64.0 in | Wt 170.2 lb

## 2022-05-03 DIAGNOSIS — J432 Centrilobular emphysema: Secondary | ICD-10-CM

## 2022-05-03 DIAGNOSIS — R6889 Other general symptoms and signs: Secondary | ICD-10-CM | POA: Diagnosis not present

## 2022-05-03 MED ORDER — ALBUTEROL SULFATE HFA 108 (90 BASE) MCG/ACT IN AERS: 2.0000 | INHALATION_SPRAY | Freq: Four times a day (QID) | RESPIRATORY_TRACT | 2 refills | Status: DC | PRN

## 2022-05-03 NOTE — Patient Instructions (Signed)
Emphysema - well-controlled --STOP Stiolto --CONTINUE Albuterol TWO puffs AS NEEDED for shortness of breath or wheezing.  --CONTINUE mucinex as needed for chest congestion

## 2022-05-03 NOTE — Progress Notes (Signed)
Subjective:   PATIENT ID: Felicia [name] GENDER: female DOB: 06-23-1950, MRN: [id number]   HPI  Chief Complaint  Patient presents with   Follow-up    Breathing doing alright good days and bad   Reason for Visit: Follow-up  Felicia [name] is a 72 year old female never smoker with left breast cancer s/p radiation, CAD, DM and HTN who presents for follow-up  Synopsis: 2022 S/p left breast radiation and developed SOB/ left chest pain. Emphysema on CT. Started on Advair. Self discontinued Advair after improved sx however restarted in Nov but feels ineffective. Started on Spiriva 2023 Breo added on to Spiriva in Jan for wheezing and "lung pain" which improved by 90%. Changed to [facility] in June due to hoarseness  05/03/22 Since our last visit she is not using inhalers regularly. Overall she feels well off inhalers except when triggered by environmental factors including pollen. Will intermittently use Stiolto as needed on her bad days when she has chest tightness. Never uses the albuterol inhaler. No exacerbations requiring steroids or antibiotics.   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  Past Medical History:  Diagnosis Date   Anemia    Anxiety    Breast cancer 09/2019   left breast IMC   CAD (coronary artery disease)    a. 3/2007s/p DES to the LAD [facility]); b. 01/2017 MV: EF 80%, small, mild apical ant defect w/o ischemia (felt to be breast atten). Low risk.   Depression    DM (diabetes mellitus)    Essential hypertension    GERD (gastroesophageal reflux disease)    Headache    secondary to neck surgery per patient   High triglycerides    Hyperlipidemia     Hypothyroid    OA (osteoarthritis)    right knee,hands   Overflow incontinence    PONV (postoperative nausea and vomiting)    s/p gallbladder    RLS (restless legs syndrome)    Urinary urgency    Uterine cancer dx'd 2014     Family History  Problem Relation Age of Onset   AAA (abdominal aortic aneurysm) Mother    Diabetes Father    Alzheimer's disease Father    Throat cancer Sister    Breast cancer Cousin      Social History   Occupational History   Not on file  Tobacco Use   Smoking status: Never   Smokeless tobacco: Never  Vaping Use   Vaping Use: Never used  Substance and Sexual Activity   Alcohol use: Yes    Comment: OCCASIONALLY   Drug use: No   Sexual activity: Not Currently    Birth control/protection: Surgical    Comment: hyst    Allergies  Allergen Reactions   Chloraprep One Step [Chlorhexidine Gluconate] Rash   Estrogens Other (See Comments)    PATIENT HAS A HISTORY OF CANCER AND HAS BEEN TOLD TO NEVER TAKE ANYTHING CONTAINING ESTROGEN, AS IT MIGHT CAUSE A RECURRENCE   Shrimp [Shellfish Allergy] Nausea And Vomiting     Outpatient Medications Prior to Visit  Medication Sig Dispense Refill   ACCU-CHEK GUIDE test strip USE TO check blood glucose UP TO four times daily  AS DIRECTED 100 strip 0   Accu-Chek Softclix Lancets lancets USE TO check blood glucose UP TO four times daily AS DIRECTED 100 each 0   anastrozole (ARIMIDEX) 1 MG tablet Take 1 tablet (1 mg total) by mouth daily. 90 tablet 4   Ascorbic Acid (VITAMIN C PO) Take 1 tablet by mouth daily.     aspirin EC 81 MG tablet Take 81 mg by mouth daily.     atorvastatin (LIPITOR) 40 MG tablet TAKE ONE TABLET BY MOUTH EVERYDAY AT BEDTIME 30 tablet 0   bisoprolol (ZEBETA) 5 MG tablet TAKE ONE TABLET BY MOUTH EVERYDAY AT BEDTIME 30 tablet 0   blood glucose meter kit and supplies Dispense based on patient and insurance preference. Use up to four times daily as directed. (FOR ICD-10 E10.9, E11.9). 1 each 0    Calcium Citrate-Vitamin D (CALCIUM + D PO) Take 1 tablet by mouth daily with supper.     celecoxib (CELEBREX) 200 MG capsule Take 1 capsule (200 mg total) by mouth daily. (Patient taking differently: Take 200 mg by mouth 2 (two) times daily.)     CINNAMON PO Take 1 capsule by mouth daily with supper.     diclofenac sodium (VOLTAREN) 1 % GEL Apply 2-4 g topically 4 (four) times daily as needed (as directed for pain).     Dulaglutide (TRULICITY) 3 MG/0.5ML SOPN Inject 3 mg as directed once a week. 6 mL 0   escitalopram (LEXAPRO) 20 MG tablet TAKE ONE TABLET BY MOUTH EVERYDAY AT BEDTIME 90 tablet 1   fenofibrate (TRICOR) 145 MG tablet TAKE ONE TABLET BY MOUTH EVERYDAY AT BEDTIME 30 tablet 0   Ferrous Sulfate (IRON PO) Take 1 tablet by mouth daily. alternates days:1 tablet one day and 2 tablets the next day     gabapentin (NEURONTIN) 300 MG capsule Take  every afternoon. 30 capsule 5   gabapentin (NEURONTIN) 600 MG tablet Take 1 tablet (600 mg total) by mouth 2 (two) times daily. 60 tablet 5   glipiZIDE (GLUCOTROL XL) 10 MG 24 hr tablet TAKE TWO TABLETS BY MOUTH EVERY MORNING 90 tablet 1   Incontinence Supply Disposable (DEPEND ADJUSTABLE UNDERWEAR) MISC 1 Application by Does not apply route daily. 16 each 11   JARDIANCE 25 MG TABS tablet TAKE ONE TABLET BY MOUTH BEFORE BREAKFAST 90 tablet 1   levothyroxine (SYNTHROID) 125 MCG tablet Take 1 tablet (125 mcg total) by mouth daily. 30 tablet 2   loratadine (CLARITIN) 10 MG tablet Take 1 tablet (10 mg total) by mouth daily as needed for allergies. 30 tablet 11   metFORMIN (GLUCOPHAGE) 500 MG tablet TAKE TWO TABLETS BY MOUTH TWICE DAILY 270 tablet 1   metroNIDAZOLE (METROGEL) 1 % gel Apply 1 application topically as needed (as directed to affected area). 45 g 2   Multiple Vitamins-Calcium (ONE-A-DAY WOMENS PO) Take 1 tablet by mouth daily.     nitroGLYCERIN (NITROSTAT) 0.4 MG SL tablet Place 1 tablet (0.4 mg total) under the tongue every 5 (five)  minutes as needed for chest pain. 25 tablet 6   omeprazole (PRILOSEC) 40 MG capsule TAKE ONE CAPSULE BY MOUTH EVERY EVENING 90 capsule 1   rOPINIRole (REQUIP) 0.5 MG tablet TAKE ONE TABLET BY MOUTH EVERYDAY AT BEDTIME 90 tablet 1   Tiotropium Bromide-Olodaterol (STIOLTO RESPIMAT) 2.5-2.5 MCG/ACT AERS Inhale 2 puffs into the lungs daily. 4 g 5   traMADol (ULTRAM) 50 MG tablet TAKE ONE TABLET BY MOUTH three times daily AS NEEDED FOR moderate OR  SEVERE pain 90 tablet 0   triamcinolone cream (KENALOG) 0.1 % Apply 1 Application topically 2 (two) times daily. For 14 days maximum 80 g 0   TURMERIC PO Take 1 capsule by mouth daily with supper.     No facility-administered medications prior to visit.    Review of Systems  Constitutional:  Negative for chills, diaphoresis, fever, malaise/fatigue and weight loss.  HENT:  Negative for congestion.   Respiratory:  Negative for cough, hemoptysis, sputum production, shortness of breath and wheezing.   Cardiovascular:  Negative for chest pain (chest tightness), palpitations and leg swelling.     Objective:   Vitals:   05/03/22 1306  BP: 110/60  Pulse: 85  SpO2: 96%  Weight: 170 lb 3.2 oz (77.2 kg)  Height: 5\' 4"  (1.626 m)     SpO2: 96 % O2 Device: None (Room air)  Physical Exam: General: Well-appearing, no acute distress HENT: [place], AT Eyes: EOMI, no scleral icterus Respiratory: Clear to auscultation bilaterally.  No crackles, wheezing or rales Cardiovascular: RRR, -M/R/G, no JVD Extremities:-Edema,-tenderness Neuro: AAO x4, CNII-XII grossly intact Psych: Normal mood, normal affect  Data Reviewed:  Imaging: CTA 04/29/20 - No pulmonary emboli. Dependent bibasilar atelectasis. Diffuse emphysema with mosaic attenuation CT Chest 01/11/21 - Multifocal reticulonodular opacities CT CAP 08/09/21 - Visualized parenchymal resolution of opacities with minimal subpleural radiation fibrosis in LUL  PFT: 08/10/20 FVC 2.61 (87%) FEV1 2.39 (105%)  Ratio 87  TLC 88% DLCO 117% Interpretation: Normal spirometry. Increased DLCO suggestive of asthma No significant bronchodilator response  Labs: CBC    Component Value Date/Time   WBC 13.9 (H) 03/26/2022 1135   RBC 4.91 03/26/2022 1135   HGB 15.0 03/26/2022 1135   HGB 13.8 08/02/2021 1036   HCT 45.7 03/26/2022 1135   PLT 276.0 03/26/2022 1135   PLT 216 08/02/2021 1036   MCV 93.1 03/26/2022 1135   MCH 31.8 08/02/2021 1036   MCHC 32.9 03/26/2022 1135   RDW 14.5 03/26/2022 1135   LYMPHSABS 8.8 (H) 03/26/2022 1135   MONOABS 0.5 03/26/2022 1135   EOSABS 0.1 03/26/2022 1135   BASOSABS 0.0 03/26/2022 1135   Echocardiogram 11/27/20 EF 60-65%. No valvular or WMA  Assessment & Plan:   Discussion: 72 year old female never smoker with hx CAD s/p stent to LAD, hx breast cancer s/p left sided radiation completed 04/2020 who presents for follow-up emphysema +/- occupation asthma +/- radiation pneumonitis. Symptoms worsen seasonally.   Discussed clinical course and management of COPD including bronchodilator regimen and action plan for exacerbation. Her prior history has demonstrated that she needs maintenance inhalers. Unclear how symptomatic she is off maintenance medications. Will stop maintenance inhaler for now and advised to use SABA PRN. If symptoms recur or worsen then can restart LAMA/LABA  ICS - hoarseness  Emphysema - well-controlled --STOP Stiolto --CONTINUE Albuterol TWO puffs AS NEEDED for shortness of breath or wheezing.  --CONTINUE mucinex as needed for chest congestion  Hoarseness - resolved after stopping ICS --CONTINUE humidifier, saline spray and reflux meds as needed   Health Maintenance Immunization History  Administered Date(s) Administered   Fluad Quad(high Dose 65+) 09/25/2021   Influenza, High Dose Seasonal PF 12/31/2016, 10/24/2017   PNEUMOCOCCAL CONJUGATE-20 09/25/2021   Pneumococcal Conjugate-13 10/24/2017   Td 07/12/2005, 02/08/2019   CT Lung Screen -  not indicated  No orders of the defined types were placed in this encounter.  Meds ordered this encounter  Medications   albuterol (VENTOLIN HFA) 108 (90 Base) MCG/ACT inhaler  Sig: Inhale 2 puffs into the lungs every 6 (six) hours as needed for wheezing or shortness of breath.    Dispense:  8 g    Refill:  2   Return in about 6 months (around 11/02/2022).   I have spent a total time of 36-minutes on the day of the appointment including chart review, data review, collecting history, coordinating care and discussing medical diagnosis and plan with the patient/family. Past medical history, allergies, medications were reviewed. Pertinent imaging, labs and tests included in this note have been reviewed and interpreted independently by me.   Geremy Rister Mechele Collin, MD [facility] 05/03/2022

## 2022-05-06 ENCOUNTER — Ambulatory Visit (INDEPENDENT_AMBULATORY_CARE_PROVIDER_SITE_OTHER): Payer: Medicare HMO | Admitting: Physician Assistant

## 2022-05-06 VITALS — BP 104/66 | HR 70 | Temp 97.1°F | Ht 64.0 in | Wt 170.6 lb

## 2022-05-06 DIAGNOSIS — R300 Dysuria: Secondary | ICD-10-CM | POA: Diagnosis not present

## 2022-05-06 DIAGNOSIS — R350 Frequency of micturition: Secondary | ICD-10-CM

## 2022-05-06 DIAGNOSIS — R6889 Other general symptoms and signs: Secondary | ICD-10-CM | POA: Diagnosis not present

## 2022-05-06 LAB — POC URINALSYSI DIPSTICK (AUTOMATED)
Bilirubin, UA: NEGATIVE
Blood, UA: POSITIVE
Glucose, UA: POSITIVE — AB
Nitrite, UA: NEGATIVE
Protein, UA: POSITIVE — AB
Spec Grav, UA: 1.015 (ref 1.010–1.025)
Urobilinogen, UA: 0.2 E.U./dL
pH, UA: 6 (ref 5.0–8.0)

## 2022-05-06 MED ORDER — CEPHALEXIN 500 MG PO CAPS: 500.0000 mg | ORAL_CAPSULE | Freq: Three times a day (TID) | ORAL | 0 refills | Status: AC

## 2022-05-06 NOTE — Progress Notes (Signed)
Subjective:    Patient ID: Felicia [name], female    DOB: Mar 19, 1950, 72 y.o.   MRN: [id number]  Chief Complaint  Patient presents with   Urinary Tract Infection    Pt in office c/o possible UTI and Hemorid that is bothersome;  pt attempted to do urine dip but not enough urine to tests; pt having urinary urgency with little output and burning urination. Pt has had symptoms over 1 week;     HPI Patient is in today for possible UTI, sx's started about 1.5 weeks ago. Urinary frequency and urgency. Sometimes having a little burning. Got up twice last night to urinate.  No fever or chills. No abdominal or flank pain. No n/v/d. Not constipated.   Not taking any treatments OTC for symptoms.   Past Medical History:  Diagnosis Date   Anemia    Anxiety    Breast cancer 09/2019   left breast IMC   CAD (coronary artery disease)    a. 3/2007s/p DES to the LAD [facility]); b. 01/2017 MV: EF 80%, small, mild apical ant defect w/o ischemia (felt to be breast atten). Low risk.   Depression    DM (diabetes mellitus)    Essential hypertension    GERD (gastroesophageal reflux disease)    Headache    secondary to neck surgery per patient   High triglycerides    Hyperlipidemia    Hypothyroid    OA (osteoarthritis)    right knee,hands   Overflow incontinence    PONV (postoperative nausea and vomiting)    s/p gallbladder    RLS (restless legs syndrome)    Urinary urgency    Uterine cancer dx'd 2014    Past Surgical History:  Procedure Laterality Date   BREAST LUMPECTOMY WITH RADIOACTIVE SEED AND SENTINEL LYMPH NODE BIOPSY Left 10/07/2019   Procedure: LEFT BREAST LUMPECTOMY WITH RADIOACTIVE SEED AND SENTINEL LYMPH NODE BIOPSY;  Surgeon: Emelia Loron, MD;  Location: [facility];  Service: General;  Laterality: Left;   CORONARY STENT PLACEMENT     CORONARY/GRAFT ACUTE MI REVASCULARIZATION     FINGER ARTHRODESIS Right 06/11/2019   Procedure: ARTHRODESIS INDEX FINGER  DISTAL PHALANGEAL JOINT;  Surgeon: Betha Loa, MD;  Location: [facility];  Service: Orthopedics;  Laterality: Right;   FOOT SURGERY Left    LEFT HEART CATH AND CORONARY ANGIOGRAPHY N/A 02/16/2018   Procedure: LEFT HEART CATH AND CORONARY ANGIOGRAPHY;  Surgeon: Runell Gess, MD;  Location: [facility];  Service: Cardiovascular;  Laterality: N/A;   LEFT HEART CATH AND CORONARY ANGIOGRAPHY N/A 05/01/2020   Procedure: LEFT HEART CATH AND CORONARY ANGIOGRAPHY;  Surgeon: Runell Gess, MD;  Location: [facility];  Service: Cardiovascular;  Laterality: N/A;   PORTACATH PLACEMENT Right 11/23/2019   Procedure: INSERTION PORT-A-CATH WITH ULTRASOUND GUIDANCE;  Surgeon: Emelia Loron, MD;  Location: [facility];  Service: General;  Laterality: Right;   REPLACEMENT TOTAL KNEE Right    SPINE SURGERY     TOTAL ABDOMINAL HYSTERECTOMY     FOR UTERUS CANCER   TOTAL HIP ARTHROPLASTY Left 2015    Family History  Problem Relation Age of Onset   AAA (abdominal aortic aneurysm) Mother    Diabetes Father    Alzheimer's disease Father    Throat cancer Sister    Breast cancer Cousin     Social History   Tobacco Use   Smoking status: Never   Smokeless tobacco: Never  Vaping Use  Vaping Use: Never used  Substance Use Topics   Alcohol use: Yes    Comment: OCCASIONALLY   Drug use: No     Allergies  Allergen Reactions   Chloraprep One Step [Chlorhexidine Gluconate] Rash   Estrogens Other (See Comments)    PATIENT HAS A HISTORY OF CANCER AND HAS BEEN TOLD TO NEVER TAKE ANYTHING CONTAINING ESTROGEN, AS IT MIGHT CAUSE A RECURRENCE   Shrimp [Shellfish Allergy] Nausea And Vomiting    Review of Systems NEGATIVE UNLESS OTHERWISE INDICATED IN HPI      Objective:     BP 104/66 (BP Location: Right Arm)   Pulse 70   Temp (!) 97.1 F (36.2 C) (Temporal)   Ht  (1.626 m)   Wt 170 lb 9.6 oz (77.4 kg)   SpO2 98%   BMI 29.28 kg/m   Wt  Readings from Last 3 Encounters:  05/06/22 170 lb 9.6 oz (77.4 kg)  05/03/22 170 lb 3.2 oz (77.2 kg)  04/08/22 172 lb (78 kg)    BP Readings from Last 3 Encounters:  05/06/22 104/66  05/03/22 110/60  04/08/22 122/72     Physical Exam Vitals and nursing note reviewed.  Constitutional:      General: She is not in acute distress.    Appearance: Normal appearance. She is not ill-appearing.  HENT:     Head: Normocephalic and atraumatic.  Cardiovascular:     Rate and Rhythm: Normal rate and regular rhythm.     Pulses: Normal pulses.     Heart sounds: Normal heart sounds.  Pulmonary:     Effort: Pulmonary effort is normal.     Breath sounds: Normal breath sounds.  Abdominal:     General: Abdomen is flat. Bowel sounds are normal.     Palpations: Abdomen is soft.     Tenderness: There is no right CVA tenderness or left CVA tenderness.  Skin:    General: Skin is warm and dry.  Neurological:     General: No focal deficit present.     Mental Status: She is alert.  Psychiatric:        Mood and Affect: Mood normal.        Assessment & Plan:  Urine frequency -     POCT Urinalysis Dipstick (Automated) -     Urine Culture; Future  Dysuria -     POCT Urinalysis Dipstick (Automated) -     Urine Culture; Future  Other orders -     Cephalexin; Take 1 capsule (500 mg total) by mouth 3 (three) times daily for 7 days.  Dispense: 21 capsule; Refill: 0   Dysuria - U/A performed in office today. Will send urine for culture and treat with cephalexin in meantime. Increase water intake. May take AZO for symptomatic relief. Recheck sooner if fever, severe back pain, vomiting, or other acutely worsening symptoms.     Return if symptoms worsen or fail to improve.   Shakeema Lippman M Bristyl Mclees, PA-C

## 2022-05-06 NOTE — Patient Instructions (Signed)
Start on cephalexin. Culture sent, will change antibiotic if needed pending results. Keep well hydrated.

## 2022-05-07 ENCOUNTER — Other Ambulatory Visit: Payer: Self-pay

## 2022-05-07 ENCOUNTER — Encounter: Payer: Self-pay | Admitting: Family Medicine

## 2022-05-07 ENCOUNTER — Ambulatory Visit: Payer: Medicare HMO | Admitting: Family Medicine

## 2022-05-07 VITALS — BP 124/70 | HR 83 | Ht 64.0 in | Wt 171.8 lb

## 2022-05-07 DIAGNOSIS — R300 Dysuria: Secondary | ICD-10-CM | POA: Diagnosis not present

## 2022-05-07 DIAGNOSIS — R6889 Other general symptoms and signs: Secondary | ICD-10-CM | POA: Diagnosis not present

## 2022-05-07 DIAGNOSIS — R350 Frequency of micturition: Secondary | ICD-10-CM | POA: Diagnosis not present

## 2022-05-07 DIAGNOSIS — M5416 Radiculopathy, lumbar region: Secondary | ICD-10-CM

## 2022-05-07 NOTE — Patient Instructions (Signed)
Thank you for coming in today.   Work on home exercises.   Happy to arrange an epidural steroid injection any time. Let me know.

## 2022-05-07 NOTE — Progress Notes (Signed)
   I, Felicia [name], CMA acting as a scribe for Felicia Graham, MD.  Felicia [name] is a 72 y.o. female who presents to [facility] today for 55-month f/u lumbar radiculopathy and R plantar fascitis. Pt was last seen by Dr. Denyse Amass on 04/02/22 and was taught HEP and was advised we could do ESI in the future. Today, pt reports improvement of foot sx. Has been seen by podiatry, had steroid injection, this was very beneficial for foot sx. Would like to hold off on ESI for now. Notes occasionally sharp zapping/stinging sensation in the lower legs, sx come and go quickly.   Dx imaging: 03/28/2022 L-spine MRI             03/19/22 R ankle XR             03/13/22 C-spine XR  Pertinent review of systems: No fevers or chills  Relevant historical information: Diabetes   Exam:  BP 124/70   Pulse 83   Ht  (1.626 m)   Wt 171 lb 12.8 oz (77.9 kg)   SpO2 98%   BMI 29.49 kg/m  General: Well Developed, well nourished, and in no acute distress.   MSK: Lumbar spine: Lumbar motion intact.  Normal gait.    Lab and Radiology Results  MRI lumbar spine date of service March 28, 2022 IMPRESSION: 1. L4-5: Multifactorial spinal stenosis that could cause neural compression on either or both sides. Shallow protrusion of the disc more prominent towards the right. Right foraminal encroachment by disc material could focally affect the right L4 nerve. 2. T12-L1: Shallow disc herniation with caudal migration in the midline towards the left. This indents the thecal sac but does not cause visible neural compression. 3. Lesser, non-compressive degenerative changes at L1-2, L2-3 and L3-4. 4. L5-S1: Disc bulge. Facet osteoarthritis. No compressive stenosis.     Electronically Signed   By: Paulina Fusi M.D.   On: 04/01/2022 15:44       Assessment and Plan: 72 y.o. female with bilateral lower extremity pain and paresthesias thought to be due to spinal stenosis.  We talked about  treatment plan and options.  Her symptoms are not bothersome enough at this time to proceed with epidural steroid injection.  I do think that would be a good idea but she like to wait on it a bit.  She will keep me updated and let me know if she would like to proceed with injection anytime in the near future.   PDMP not reviewed this encounter. No orders of the defined types were placed in this encounter.  No orders of the defined types were placed in this encounter.    Discussed warning signs or symptoms. Please see discharge instructions. Patient expresses understanding.   The above documentation has been reviewed and is accurate and complete Felicia [name], M.D.

## 2022-05-07 NOTE — Addendum Note (Signed)
Addended by: Laddie Aquas A on: 05/07/2022 10:15 AM   Modules accepted: Orders

## 2022-05-09 ENCOUNTER — Other Ambulatory Visit: Payer: Self-pay | Admitting: Physician Assistant

## 2022-05-09 LAB — URINE CULTURE
MICRO NUMBER:: 14862235
SPECIMEN QUALITY:: ADEQUATE

## 2022-05-09 MED ORDER — SULFAMETHOXAZOLE-TRIMETHOPRIM 800-160 MG PO TABS: 1.0000 | ORAL_TABLET | Freq: Two times a day (BID) | ORAL | 0 refills | Status: AC

## 2022-05-13 ENCOUNTER — Telehealth: Payer: Self-pay | Admitting: Physician Assistant

## 2022-05-13 NOTE — Telephone Encounter (Signed)
Please see pt msg and advised

## 2022-05-13 NOTE — Telephone Encounter (Signed)
Patient states she still doesn't have access to the trulicity. States that [facility] rx [facility] but she has not heard from pharmacy. Informed pt I would inform PCP but also recommended she call herself and check with pharmacy. Pt verbalized understanding.

## 2022-05-14 ENCOUNTER — Other Ambulatory Visit: Payer: Self-pay | Admitting: Physician Assistant

## 2022-05-14 MED ORDER — TRULICITY 3 MG/0.5ML ~~LOC~~ SOAJ: 3.0000 mg | SUBCUTANEOUS | 5 refills | Status: DC

## 2022-05-14 NOTE — Telephone Encounter (Signed)
Called pt and lvm advising new Rx sent to [facility] pharmacy

## 2022-05-15 NOTE — Telephone Encounter (Signed)
Pt called back and states there is a backorder for Trulicity and has not taken it for 2 weeks. She would like to know if we can send an alternative until it comes it. Please advise.

## 2022-05-16 ENCOUNTER — Other Ambulatory Visit: Payer: Self-pay | Admitting: Physician Assistant

## 2022-05-16 MED ORDER — TIRZEPATIDE 2.5 MG/0.5ML ~~LOC~~ SOAJ: 2.5000 mg | SUBCUTANEOUS | 0 refills | Status: DC

## 2022-05-16 NOTE — Telephone Encounter (Signed)
Called pt and advised medication alternative sent in and possible side effects, pt was advised to keep [place] updated on how she is doing with medication. Pt verbalized understanding

## 2022-05-16 NOTE — Telephone Encounter (Signed)
Patient needs an alternative to Trulicity since this is out of stock. [facility] out of stock. Please advise

## 2022-05-22 ENCOUNTER — Other Ambulatory Visit: Payer: Self-pay | Admitting: Cardiovascular Disease

## 2022-05-22 ENCOUNTER — Other Ambulatory Visit: Payer: Self-pay | Admitting: Physician Assistant

## 2022-05-24 ENCOUNTER — Other Ambulatory Visit: Payer: Self-pay | Admitting: Hematology and Oncology

## 2022-05-27 ENCOUNTER — Other Ambulatory Visit: Payer: Self-pay | Admitting: Pulmonary Disease

## 2022-06-04 ENCOUNTER — Other Ambulatory Visit: Payer: Self-pay | Admitting: Physician Assistant

## 2022-06-04 NOTE — Telephone Encounter (Signed)
Refill 2.5 mg or increase to 5 mg? Please advise

## 2022-06-06 ENCOUNTER — Ambulatory Visit (INDEPENDENT_AMBULATORY_CARE_PROVIDER_SITE_OTHER): Payer: Medicare HMO | Admitting: Physician Assistant

## 2022-06-06 ENCOUNTER — Other Ambulatory Visit (INDEPENDENT_AMBULATORY_CARE_PROVIDER_SITE_OTHER): Payer: Medicare HMO

## 2022-06-06 ENCOUNTER — Encounter: Payer: Self-pay | Admitting: Physician Assistant

## 2022-06-06 ENCOUNTER — Ambulatory Visit (INDEPENDENT_AMBULATORY_CARE_PROVIDER_SITE_OTHER)
Admission: RE | Admit: 2022-06-06 | Discharge: 2022-06-06 | Disposition: A | Discharge status: Home / Self Care | Payer: Medicare HMO | Source: Ambulatory Visit | Attending: Physician Assistant | Admitting: Physician Assistant

## 2022-06-06 VITALS — BP 126/64 | HR 76 | Temp 98.2°F | Ht 64.0 in | Wt 171.6 lb

## 2022-06-06 DIAGNOSIS — R61 Generalized hyperhidrosis: Secondary | ICD-10-CM

## 2022-06-06 DIAGNOSIS — R7989 Other specified abnormal findings of blood chemistry: Secondary | ICD-10-CM

## 2022-06-06 DIAGNOSIS — E538 Deficiency of other specified B group vitamins: Secondary | ICD-10-CM | POA: Diagnosis not present

## 2022-06-06 DIAGNOSIS — F419 Anxiety disorder, unspecified: Secondary | ICD-10-CM

## 2022-06-06 DIAGNOSIS — R948 Abnormal results of function studies of other organs and systems: Secondary | ICD-10-CM | POA: Diagnosis not present

## 2022-06-06 DIAGNOSIS — R5383 Other fatigue: Secondary | ICD-10-CM | POA: Diagnosis not present

## 2022-06-06 DIAGNOSIS — F32A Depression, unspecified: Secondary | ICD-10-CM

## 2022-06-06 DIAGNOSIS — Z170 Estrogen receptor positive status [ER+]: Secondary | ICD-10-CM

## 2022-06-06 DIAGNOSIS — C50412 Malignant neoplasm of upper-outer quadrant of left female breast: Secondary | ICD-10-CM

## 2022-06-06 DIAGNOSIS — C799 Secondary malignant neoplasm of unspecified site: Secondary | ICD-10-CM | POA: Diagnosis not present

## 2022-06-06 DIAGNOSIS — E119 Type 2 diabetes mellitus without complications: Secondary | ICD-10-CM | POA: Diagnosis not present

## 2022-06-06 LAB — TSH: TSH: 1.17 u[IU]/mL (ref 0.35–5.50)

## 2022-06-06 LAB — URINALYSIS, ROUTINE W REFLEX MICROSCOPIC
Bilirubin Urine: NEGATIVE
Hgb urine dipstick: NEGATIVE
Ketones, ur: NEGATIVE
Leukocytes,Ua: NEGATIVE
Nitrite: NEGATIVE
Specific Gravity, Urine: 1.01 (ref 1.000–1.030)
Total Protein, Urine: NEGATIVE
Urine Glucose: >=1000 — AB
Urobilinogen, UA: 0.2 (ref 0.0–1.0)
pH: 6 (ref 5.0–8.0)

## 2022-06-06 LAB — CBC WITH DIFFERENTIAL/PLATELET
Basophils Absolute: 0.1 10*3/uL (ref 0.0–0.1)
Basophils Relative: 0.4 % (ref 0.0–3.0)
Eosinophils Absolute: 0.1 10*3/uL (ref 0.0–0.7)
Eosinophils Relative: 0.5 % (ref 0.0–5.0)
HCT: 45.7 % (ref 36.0–46.0)
Hemoglobin: 14.7 g/dL (ref 12.0–15.0)
Lymphocytes Relative: 59.3 % — ABNORMAL HIGH (ref 12.0–46.0)
Lymphs Abs: 7.9 10*3/uL — ABNORMAL HIGH (ref 0.7–4.0)
MCHC: 32.1 g/dL (ref 30.0–36.0)
MCV: 94.4 fl (ref 78.0–100.0)
Monocytes Absolute: 0.6 10*3/uL (ref 0.1–1.0)
Monocytes Relative: 4.4 % (ref 3.0–12.0)
Neutro Abs: 4.7 10*3/uL (ref 1.4–7.7)
Neutrophils Relative %: 35.4 % — ABNORMAL LOW (ref 43.0–77.0)
Platelets: 285 10*3/uL (ref 150.0–400.0)
RBC: 4.84 Mil/uL (ref 3.87–5.11)
RDW: 15.4 % (ref 11.5–15.5)
WBC: 13.4 10*3/uL — ABNORMAL HIGH (ref 4.0–10.5)

## 2022-06-06 LAB — VITAMIN D 25 HYDROXY (VIT D DEFICIENCY, FRACTURES): VITD: 76.33 ng/mL (ref 30.00–100.00)

## 2022-06-06 LAB — IBC + FERRITIN
Ferritin: 49.2 ng/mL (ref 10.0–291.0)
Iron: 88 ug/dL (ref 42–145)
Saturation Ratios: 17.2 % — ABNORMAL LOW (ref 20.0–50.0)
TIBC: 512.4 ug/dL — ABNORMAL HIGH (ref 250.0–450.0)
Transferrin: 366 mg/dL — ABNORMAL HIGH (ref 212.0–360.0)

## 2022-06-06 LAB — COMPREHENSIVE METABOLIC PANEL
ALT: 23 U/L (ref 0–35)
AST: 21 U/L (ref 0–37)
Albumin: 4.6 g/dL (ref 3.5–5.2)
Alkaline Phosphatase: 49 U/L (ref 39–117)
BUN: 15 mg/dL (ref 6–23)
CO2: 26 mEq/L (ref 19–32)
Calcium: 10.6 mg/dL — ABNORMAL HIGH (ref 8.4–10.5)
Chloride: 103 mEq/L (ref 96–112)
Creatinine, Ser: 0.8 mg/dL (ref 0.40–1.20)
GFR: 73.61 mL/min (ref >60.00)
Glucose, Bld: 289 mg/dL — ABNORMAL HIGH (ref 70–99)
Potassium: 4.2 mEq/L (ref 3.5–5.1)
Sodium: 140 mEq/L (ref 135–145)
Total Bilirubin: 0.5 mg/dL (ref 0.2–1.2)
Total Protein: 7.4 g/dL (ref 6.0–8.3)

## 2022-06-06 LAB — VITAMIN B12: Vitamin B-12: 483 pg/mL (ref 211–911)

## 2022-06-06 LAB — GLUCOSE, POCT (MANUAL RESULT ENTRY): POC Glucose: 257 mg/dl — AB (ref 70–99)

## 2022-06-06 MED ORDER — CELECOXIB 200 MG PO CAPS: 200.0000 mg | ORAL_CAPSULE | Freq: Two times a day (BID) | ORAL | 1 refills | Status: DC

## 2022-06-06 NOTE — Progress Notes (Signed)
Subjective:    Patient ID: Felicia [name], female    DOB: [birth year], 72 y.o.   MRN: [id number]  Chief Complaint  Patient presents with   Depression    Pt in the office to discuss depression; pt says she has just been down and sleeping a lot, pt needs refill of Celebrex, Rheumatology won't refill until coming into the office to see her and pt feels its unnecessary, pt is in pain since being off Celebrex in 1 one week and can feel the difference. Pt says she is just feeling weird and others are noticing, pt feels clammy;     HPI Patient is in today for "feeling weird." About 2 weeks ago started feeling sad all the time, can't seem to pick herself up out of it. Sleeping "too much." She has been off Celebrex, ribs and back are hurting more. Feeling more clammy lately. Drenching night sweats, sheets are soaked in the mornings. Appetite has been lousy, not planning food well, not eating well, just eating a snack here and there. Losing her voice occasionally, thinks allergies. Chills intermittently. Diarrhea the last week also, dark brown, no blood noticed. Intermittent normal stools. Still having headaches, sometimes feels "off", but usually resolves quickly. No syncope, but says she feels wobbly in the shower, balance seems more "off."   Past Medical History:  Diagnosis Date   Anemia    Anxiety    Breast cancer (HCC) 09/2019   left breast IMC   CAD (coronary artery disease)    a. 3/2007s/p DES to the LAD [facility]); b. 01/2017 MV: EF 80%, small, mild apical ant defect w/o ischemia (felt to be breast atten). Low risk.   Depression    DM (diabetes mellitus) (HCC)    Essential hypertension    GERD (gastroesophageal reflux disease)    Headache    secondary to neck surgery per patient   High triglycerides    Hyperlipidemia    Hypothyroid    OA (osteoarthritis)    right knee,hands   Overflow incontinence    PONV (postoperative nausea and vomiting)    s/p gallbladder    RLS (restless  legs syndrome)    Urinary urgency    Uterine cancer (HCC) dx'd 2014    Past Surgical History:  Procedure Laterality Date   BREAST LUMPECTOMY WITH RADIOACTIVE SEED AND SENTINEL LYMPH NODE BIOPSY Left 10/07/2019   Procedure: LEFT BREAST LUMPECTOMY WITH RADIOACTIVE SEED AND SENTINEL LYMPH NODE BIOPSY;  Surgeon: Emelia Loron, MD;  Location: [facility];  Service: General;  Laterality: Left;   CORONARY STENT PLACEMENT     CORONARY/GRAFT ACUTE MI REVASCULARIZATION     FINGER ARTHRODESIS Right 06/11/2019   Procedure: ARTHRODESIS INDEX FINGER DISTAL PHALANGEAL JOINT;  Surgeon: Betha Loa, MD;  Location: [facility];  Service: Orthopedics;  Laterality: Right;   FOOT SURGERY Left    LEFT HEART CATH AND CORONARY ANGIOGRAPHY N/A 02/16/2018   Procedure: LEFT HEART CATH AND CORONARY ANGIOGRAPHY;  Surgeon: Runell Gess, MD;  Location: [facility];  Service: Cardiovascular;  Laterality: N/A;   LEFT HEART CATH AND CORONARY ANGIOGRAPHY N/A 05/01/2020   Procedure: LEFT HEART CATH AND CORONARY ANGIOGRAPHY;  Surgeon: Runell Gess, MD;  Location: [facility];  Service: Cardiovascular;  Laterality: N/A;   PORTACATH PLACEMENT Right 11/23/2019   Procedure: INSERTION PORT-A-CATH WITH ULTRASOUND GUIDANCE;  Surgeon: Emelia Loron, MD;  Location: [facility];  Service: General;  Laterality: Right;   REPLACEMENT TOTAL  KNEE Right    SPINE SURGERY     TOTAL ABDOMINAL HYSTERECTOMY     FOR UTERUS CANCER   TOTAL HIP ARTHROPLASTY Left 2015    Family History  Problem Relation Age of Onset   AAA (abdominal aortic aneurysm) Mother    Diabetes Father    Alzheimer's disease Father    Throat cancer Sister    Breast cancer Cousin     Social History   Tobacco Use   Smoking status: Never   Smokeless tobacco: Never  Vaping Use   Vaping Use: Never used  Substance Use Topics   Alcohol use: Yes    Comment: OCCASIONALLY   Drug use: No      Allergies  Allergen Reactions   Chloraprep One Step [Chlorhexidine Gluconate] Rash   Estrogens Other (See Comments)    PATIENT HAS A HISTORY OF CANCER AND HAS BEEN TOLD TO NEVER TAKE ANYTHING CONTAINING ESTROGEN, AS IT MIGHT CAUSE A RECURRENCE   Shrimp [Shellfish Allergy] Nausea And Vomiting    Review of Systems NEGATIVE UNLESS OTHERWISE INDICATED IN HPI      Objective:     BP 126/64 (BP Location: Right Arm)   Pulse 76   Temp 98.2 F (36.8 C) (Temporal)   Ht 5\' 4"  (1.626 m)   Wt 171 lb 9.6 oz (77.8 kg)   SpO2 97%   BMI 29.46 kg/m   Wt Readings from Last 3 Encounters:  06/06/22 171 lb 9.6 oz (77.8 kg)  05/07/22 171 lb 12.8 oz (77.9 kg)  05/06/22 170 lb 9.6 oz (77.4 kg)    BP Readings from Last 3 Encounters:  06/06/22 126/64  05/07/22 124/70  05/06/22 104/66     Physical Exam Constitutional:      General: She is not in acute distress.    Appearance: Normal appearance. She is not ill-appearing or toxic-appearing.     Comments: Somewhat diaphoretic  HENT:     Right Ear: Tympanic membrane normal.     Left Ear: Tympanic membrane normal.     Nose: Nose normal.     Mouth/Throat:     Mouth: Mucous membranes are moist.  Eyes:     Extraocular Movements: Extraocular movements intact.     Conjunctiva/sclera: Conjunctivae normal.     Pupils: Pupils are equal, round, and reactive to light.  Cardiovascular:     Rate and Rhythm: Normal rate and regular rhythm.     Pulses: Normal pulses.     Heart sounds: Normal heart sounds.  Pulmonary:     Effort: Pulmonary effort is normal.     Breath sounds: Normal breath sounds.  Musculoskeletal:     Right lower leg: No edema.     Left lower leg: No edema.  Skin:    Findings: No rash.  Neurological:     General: No focal deficit present.     Mental Status: She is alert.     Cranial Nerves: No cranial nerve deficit.     Sensory: No sensory deficit.     Motor: No weakness.     Coordination: Coordination normal.     Gait:  Gait normal.  Psychiatric:        Behavior: Behavior normal.     Comments: Somewhat depressed today        Assessment & Plan:  Night sweat -     Comprehensive metabolic panel; Future -     CBC with Differential/Platelet; Future -     TSH; Future -     IBC +  Ferritin; Future -     VITAMIN D 25 Hydroxy (Vit-D Deficiency, Fractures); Future -     Vitamin B12; Future -     Urinalysis, Routine w reflex microscopic; Future -     PTH, Intact (ICMA) and Ionized Calcium; Future  Other fatigue -     Comprehensive metabolic panel; Future -     CBC with Differential/Platelet; Future -     TSH; Future -     IBC + Ferritin; Future -     VITAMIN D 25 Hydroxy (Vit-D Deficiency, Fractures); Future -     Vitamin B12; Future -     Urinalysis, Routine w reflex microscopic; Future -     PTH, Intact (ICMA) and Ionized Calcium; Future  Malignant neoplasm of upper-outer quadrant of left breast in female, estrogen receptor positive (HCC) -     DG Humerus Left; Future  Type 2 diabetes mellitus without complication, without long-term current use of insulin (HCC) -     POCT glucose (manual entry) -     Comprehensive metabolic panel; Future  Abnormal positron emission tomography (PET) scan -     DG Humerus Left; Future  High serum vitamin D -     VITAMIN D 25 Hydroxy (Vit-D Deficiency, Fractures); Future  B12 deficiency -     Vitamin B12; Future  Hypercalcemia -     Comprehensive metabolic panel; Future -     PTH, Intact (ICMA) and Ionized Calcium; Future  Other orders -     Celecoxib; Take 1 capsule (200 mg total) by mouth 2 (two) times daily.  Dispense: 180 capsule; Refill: 1    Thorough discussion and evaluation with patient today in regard to her symptoms. She has a history of breast and endometrial cancer, followed with Dr. Pamelia Hoit. Reviewed her labs and imaging; noted that she had abnormal uptake on scan in July over left humerus, did not have dedicated XRAY as was recommended. Will  plan to check this XRAY, as well as extensive labs today. Worst case, symptoms are secondary to malignancy.   Also possible she is having these symptoms as side effects from the new Mounjaro or Anastrozole. I will plan on collaborating with Dr. Pamelia Hoit as soon as her results are back.   We can look at switching her from Lexapro to a different SSRI to help with her depression symptoms. Denies any SI or HI.  Red flags / ED precautions discussed with pt. She's agreeable and understanding.  Close f/up with her in the next few weeks.      Natividad Halls M Ataya Murdy, PA-C

## 2022-06-07 ENCOUNTER — Encounter: Payer: Self-pay | Admitting: Physician Assistant

## 2022-06-07 ENCOUNTER — Telehealth: Payer: Self-pay | Admitting: Family Medicine

## 2022-06-07 LAB — PTH, INTACT (ICMA) AND IONIZED CALCIUM
Calcium, Ion: 5.5 mg/dL (ref 4.7–5.5)
Calcium: 10.8 mg/dL — ABNORMAL HIGH (ref 8.6–10.4)
PTH: 9 pg/mL — ABNORMAL LOW (ref 16–77)

## 2022-06-07 NOTE — Telephone Encounter (Signed)
Pt would like to proceed with epidural

## 2022-06-11 ENCOUNTER — Other Ambulatory Visit: Payer: Self-pay

## 2022-06-11 DIAGNOSIS — M5416 Radiculopathy, lumbar region: Secondary | ICD-10-CM

## 2022-06-11 NOTE — Telephone Encounter (Signed)
Pt sent MyChart message informing her the ESI order has been placed and provided [facility]'s number to call and schedule.

## 2022-06-12 NOTE — Telephone Encounter (Signed)
Pt read msg in MyChart.

## 2022-06-16 ENCOUNTER — Other Ambulatory Visit: Payer: Self-pay | Admitting: Physician Assistant

## 2022-06-17 ENCOUNTER — Telehealth: Payer: Self-pay | Admitting: Physician Assistant

## 2022-06-17 NOTE — Telephone Encounter (Signed)
Patient states she has been having neck pain slightly worse than normal. She is wondering if she should call her sports medicine doctor or her neurologist. She would like Felicia [name]'s advice on which one to go to.

## 2022-06-17 NOTE — Telephone Encounter (Signed)
Unable to contact Patient, no voice mail

## 2022-06-17 NOTE — Telephone Encounter (Signed)
Please advise 

## 2022-06-18 NOTE — Telephone Encounter (Signed)
Pt has appt with Dr Denyse Amass on Thursday. Neck on left side is very painful up to base of skull. Pt states pain is extreme but will keep PCP posted and updated.

## 2022-06-19 NOTE — Telephone Encounter (Signed)
Pt has been advised of PCP suggestions and verbalized understanding

## 2022-06-20 ENCOUNTER — Encounter: Payer: Self-pay | Admitting: Family Medicine

## 2022-06-20 ENCOUNTER — Ambulatory Visit (INDEPENDENT_AMBULATORY_CARE_PROVIDER_SITE_OTHER): Payer: Medicare HMO

## 2022-06-20 ENCOUNTER — Ambulatory Visit (INDEPENDENT_AMBULATORY_CARE_PROVIDER_SITE_OTHER): Payer: Medicare HMO | Admitting: Family Medicine

## 2022-06-20 VITALS — BP 136/70 | HR 69 | Ht 64.0 in | Wt 178.2 lb

## 2022-06-20 DIAGNOSIS — M542 Cervicalgia: Secondary | ICD-10-CM

## 2022-06-20 DIAGNOSIS — M79604 Pain in right leg: Secondary | ICD-10-CM

## 2022-06-20 DIAGNOSIS — R6889 Other general symptoms and signs: Secondary | ICD-10-CM | POA: Diagnosis not present

## 2022-06-20 DIAGNOSIS — M79661 Pain in right lower leg: Secondary | ICD-10-CM | POA: Diagnosis not present

## 2022-06-20 MED ORDER — TIZANIDINE HCL 2 MG PO TABS: 2.0000 mg | ORAL_TABLET | Freq: Every evening | ORAL | 0 refills | Status: DC | PRN

## 2022-06-20 NOTE — Progress Notes (Signed)
I, Stevenson Clinch, CMA acting as a scribe for Clementeen Graham, MD.  Felicia [name] is a 72 y.o. female who presents to [facility] today for neck pain. Hx of significant multilevel posterior fusion. She last visit w/ Dr. Denyse Amass about her neck pain was on 03/13/22. Today, pt reports worsening neck pain since this past Sunday. Pt locates pain to left side of the neck, clavicle, periscapular region,ribs, and chest. Pt denies SOB, palor, dizziness, weakness in the arm, increased sweating or anxiety related to chest pain. Notes being in so much pain that she was sitting in bed crying contemplating calling the EMS. Remembers turning to look to the left and feelin a sharp burning pain in the neck. Almost felt like the pain was in the blood vessels. Mechanical sx present. Off Aspirin 81 mg until injection on Monday.   Radiates: yes UE numbness/tingling: yes UE weakness: no Aggravates: ROM Treatments tried: prior PT (intolerable)  Pt also notes pain in the lower right leg yesterday. She went to take a step and it felt like the lower leg was going to snap in half. Has some lingering pain today, ambulating with a limp and cane. Denies swelling, warmth, erythema.   Dx imaging: 03/13/22 C-spine XR  Pertinent review of systems: No fevers or chills  Relevant historical information: Elevated calcium with normal parathyroid hormone.  Elevated white blood cell count.  Emphysema diabetes hypertension   Exam:  BP 136/70   Pulse 69   Ht 5\' 4"  (1.626 m)   Wt 178 lb 3.2 oz (80.8 kg)   SpO2 95%   BMI 30.59 kg/m  General: Well Developed, well nourished, and in no acute distress.   MSK: C-spine normal appearing Tender palpation left anterior cervical paraspinal musculature especially sternocleidomastoid.  Decreased cervical motion. Upper extremity strength is intact.  Right leg mature scar anterior knee.  Leg is otherwise normal-appearing and nontender with normal motion.   Lab  and Radiology Results  X-ray images right tib-fib obtained today personally and independently interpreted Well-appearing hardware from total knee replacement.  No acute fractures or hardware loosening is visible.  She does have a old appearing bone spur or osteophyte at the anterior tibia that patellar tendon insertion. Await formal radiology review    Assessment and Plan: 72 y.o. female with left lateral neck pain thought to be due to muscle spasm and dysfunction of sternocleidomastoid.  She is a good candidate for physical therapy.  Plan to refer to PT recommend tizanidine at bedtime heating pad.  Right lower leg pain etiology is unclear.  Her leg is feeling better now and it looks normal clinically and on x-ray.  Watchful waiting.  She is seeing oncology and endocrinology soon for her recent laboratory abnormalities.  PDMP not reviewed this encounter. Orders Placed This Encounter  Procedures   DG Tibia/Fibula Right    Standing Status:   Future    Number of Occurrences:   1    Standing Expiration Date:   06/20/2023    Order Specific Question:   Reason for Exam (SYMPTOM  OR DIAGNOSIS REQUIRED)    Answer:   right lower leg pain    Order Specific Question:   Preferred imaging location?    Answer:   [facility]   Ambulatory referral to Physical Therapy    Referral Priority:   Routine    Referral Type:   Physical Medicine    Referral Reason:   Specialty Services Required  Requested Specialty:   Physical Therapy    Number of Visits Requested:   1   Meds ordered this encounter  Medications   tiZANidine (ZANAFLEX) 2 MG tablet    Sig: Take 1-2 tablets (2-4 mg total) by mouth at bedtime as needed.    Dispense:  60 tablet    Refill:  0     Discussed warning signs or symptoms. Please see discharge instructions. Patient expresses understanding.   The above documentation has been reviewed and is accurate and complete Clementeen Graham, M.D.

## 2022-06-20 NOTE — Patient Instructions (Signed)
Thank you for coming in today.  Referral placed to [facility] at [place].  Please get an Xray today before you leave  See you in 6 weeks

## 2022-06-21 ENCOUNTER — Other Ambulatory Visit: Payer: Self-pay | Admitting: Cardiovascular Disease

## 2022-06-24 ENCOUNTER — Ambulatory Visit
Admission: RE | Admit: 2022-06-24 | Discharge: 2022-06-24 | Disposition: A | Discharge status: Home / Self Care | Payer: Medicare HMO | Source: Ambulatory Visit | Attending: Family Medicine | Admitting: Family Medicine

## 2022-06-24 DIAGNOSIS — M5416 Radiculopathy, lumbar region: Secondary | ICD-10-CM

## 2022-06-24 DIAGNOSIS — R6889 Other general symptoms and signs: Secondary | ICD-10-CM | POA: Diagnosis not present

## 2022-06-24 DIAGNOSIS — M48061 Spinal stenosis, lumbar region without neurogenic claudication: Secondary | ICD-10-CM | POA: Diagnosis not present

## 2022-06-24 MED ORDER — METHYLPREDNISOLONE ACETATE 40 MG/ML INJ SUSP (RADIOLOG
80.0000 mg | Freq: Once | INTRAMUSCULAR | Status: AC
  Administered 2022-06-24: 80 mg via EPIDURAL

## 2022-06-24 MED ORDER — IOPAMIDOL (ISOVUE-M 200) INJECTION 41%
1.0000 mL | Freq: Once | INTRAMUSCULAR | Status: AC
  Administered 2022-06-24: 1 mL via EPIDURAL

## 2022-06-24 NOTE — Discharge Instructions (Signed)

## 2022-06-25 NOTE — Progress Notes (Signed)
Right lower leg x-ray looks okay to radiology.  We can see Felicia [name] had a knee replacement but that part looks okay.

## 2022-06-27 ENCOUNTER — Telehealth: Payer: Self-pay | Admitting: Physician Assistant

## 2022-06-27 NOTE — Telephone Encounter (Signed)
Please have patient schedule an appointment or VV per PCP recommendations

## 2022-06-27 NOTE — Telephone Encounter (Signed)
Pt would like a copy of her lab results. Also, she would like to know what to do next about her sugar levels. Please advise.

## 2022-06-27 NOTE — Telephone Encounter (Signed)
Please see patient msg and advise 

## 2022-07-02 ENCOUNTER — Ambulatory Visit: Payer: Medicare HMO | Admitting: Physical Therapy

## 2022-07-02 DIAGNOSIS — M546 Pain in thoracic spine: Secondary | ICD-10-CM | POA: Diagnosis not present

## 2022-07-02 DIAGNOSIS — M5416 Radiculopathy, lumbar region: Secondary | ICD-10-CM

## 2022-07-02 DIAGNOSIS — M542 Cervicalgia: Secondary | ICD-10-CM

## 2022-07-02 DIAGNOSIS — R6889 Other general symptoms and signs: Secondary | ICD-10-CM | POA: Diagnosis not present

## 2022-07-02 NOTE — Therapy (Addendum)
 OUTPATIENT PHYSICAL THERAPY LOWER EXTREMITY EVALUATION   Patient Name: Felicia [name] MRN: [id number] DOB:06-Nov-1950, 72 y.o., female Today's Date: 07/02/2022  END OF SESSION:  PT End of Session - 07/07/22 1803     Visit Number 1    Number of Visits 16    Date for PT Re-Evaluation 08/27/22    Authorization Type [employer]    PT Start Time 1017    PT Stop Time 1100    PT Time Calculation (min) 43 min    Activity Tolerance Patient tolerated treatment well    Behavior During Therapy WFL for tasks assessed/performed              Past Medical History:  Diagnosis Date   Anemia    Anxiety    Breast cancer (HCC) 09/2019   left breast IMC   CAD (coronary artery disease)    a. 3/2007s/p DES to the LAD ([place] ); b. 01/2017 MV: EF 80%, small, mild apical ant defect w/o ischemia (felt to be breast atten). Low risk.   Depression    DM (diabetes mellitus) (HCC)    Essential hypertension    GERD (gastroesophageal reflux disease)    Headache    secondary to neck surgery per patient   High triglycerides    Hyperlipidemia    Hypothyroid    OA (osteoarthritis)    right knee,hands   Overflow incontinence    PONV (postoperative nausea and vomiting)    s/p gallbladder    RLS (restless legs syndrome)    Urinary urgency    Uterine cancer (HCC) dx'd 2014   Past Surgical History:  Procedure Laterality Date   BREAST LUMPECTOMY WITH RADIOACTIVE SEED AND SENTINEL LYMPH NODE BIOPSY Left 10/07/2019   Procedure: LEFT BREAST LUMPECTOMY WITH RADIOACTIVE SEED AND SENTINEL LYMPH NODE BIOPSY;  Surgeon: Enid Harry, MD;  Location: [facility];  Service: General;  Laterality: Left;   CORONARY STENT PLACEMENT     CORONARY/GRAFT ACUTE MI REVASCULARIZATION     FINGER ARTHRODESIS Right 06/11/2019   Procedure: ARTHRODESIS INDEX FINGER DISTAL PHALANGEAL JOINT;  Surgeon: Brunilda Capra, MD;  Location: [facility];  Service: Orthopedics;  Laterality: Right;   FOOT  SURGERY Left    LEFT HEART CATH AND CORONARY ANGIOGRAPHY N/A 02/16/2018   Procedure: LEFT HEART CATH AND CORONARY ANGIOGRAPHY;  Surgeon: Avanell Leigh, MD;  [facility]: [facility];  Service: Cardiovascular;  Laterality: N/A;   LEFT HEART CATH AND CORONARY ANGIOGRAPHY N/A 05/01/2020   Procedure: LEFT HEART CATH AND CORONARY ANGIOGRAPHY;  Surgeon: Avanell Leigh, MD;  [facility]: [facility];  Service: Cardiovascular;  Laterality: N/A;   PORTACATH PLACEMENT Right 11/23/2019   Procedure: INSERTION PORT-A-CATH WITH ULTRASOUND GUIDANCE;  Surgeon: Enid Harry, MD;  Location: [facility];  Service: General;  Laterality: Right;   REPLACEMENT TOTAL KNEE Right    SPINE SURGERY     TOTAL ABDOMINAL HYSTERECTOMY     FOR UTERUS CANCER   TOTAL HIP ARTHROPLASTY Left 2015   Patient Active Problem List   Diagnosis Date Noted   Erosive osteoarthritis of multiple sites 09/26/2021   Frequent headaches 05/31/2021   Bilateral impacted cerumen 02/22/2021   Nasal septal perforation 02/22/2021   Postnasal drip 02/22/2021   Hoarse 01/25/2021   Hyperglycemia due to type 2 diabetes mellitus (HCC) 01/18/2021   Mixed simple and mucopurulent chronic bronchitis (HCC) 12/27/2020   Centrilobular emphysema (HCC) 07/31/2020   Shortness of breath 07/31/2020   Pressure injury of skin  04/30/2020   Hypothyroid    Anxiety    Anxiety and depression    Port-A-Cath in place 11/24/2019   Hepatic steatosis 09/28/2019   Malignant neoplasm of upper-outer quadrant of left breast in female, estrogen receptor positive (HCC) 09/02/2019   Trigger index finger of right hand 05/05/2019   Primary osteoarthritis of first carpometacarpal joint of right hand 05/05/2019   Osteoarthritis of finger of right hand 05/05/2019   Endometrial cancer (HCC) 10/23/2018   Cirrhosis of liver without ascites (HCC) 10/23/2018   Diarrhea 10/23/2018   Chest pain 02/15/2018   Coronary artery disease involving native  coronary artery of native heart without angina pectoris 11/18/2017   Hyperlipidemia LDL goal <70 11/18/2017   Type 2 diabetes mellitus without complication, without long-term current use of insulin  (HCC) 11/18/2017   Dizziness 11/18/2017    PCP: Alda Amas, PA  REFERRING PROVIDER: Syliva Even, MD  REFERRING DIAG: Neck pain, R leg pain   THERAPY DIAG:  Cervicalgia  Pain in thoracic spine  Radiculopathy, lumbar region  Rationale for Evaluation and Treatment: Rehabilitation  ONSET DATE: years  SUBJECTIVE:   SUBJECTIVE STATEMENT:  Pt reports R leg giving out frequently, maybe a couple times a week. Possibly from back?   She has ad injections and nerve ablasion in the past  . Is having pain into R leg , thigh and glute, and sometimes into shin. Reports very frequent muscle cramps in bil calves and feet- several times per day.  Has had Previous TKE on R states good recovery.   Also states pain in neck, has had Neck fused from C3-7.  States Pain starting in high cervical giving her headaches, was hurting into L side musculature when she was at Dr.  Donnita Gales mild pain into R shoulder pain. Rib cage hurting, hard to breathe sometimes due to pain  Previous Breat cancer,  pain since radiation and chemo Has rollator walker, using cane today.    PERTINENT HISTORY: Diabetes, history of breast cancer (LEFT), history of endometrial cancer (with history of radiation), spine surgery C3-C7 fusion 2017, heart disease, L THA 2015, history of 3 right knee scopes and right TKA, history of inner ear issue (required epley  maneuver),  PAIN:     Are you having pain? Yes: NPRS scale: 6/10 Pain location: neck Pain description: tight, sore, headaches  Aggravating factors: none stated  Relieving factors: none stated   Are you having pain? Yes: NPRS scale: 5/10 Pain location: back, thoracic and lumbar  Pain description: tight, sore, radiating into ribs  Aggravating factors: none stated   Relieving factors: none stated    PRECAUTIONS: None  WEIGHT BEARING RESTRICTIONS: No  FALLS:  Has patient fallen in last 6 months? Yes. Number of falls unable to state- "a few"    PLOF: Independent with household mobility with device and Independent with community mobility with device  PATIENT GOALS: decreased pain, be more active .     OBJECTIVE:   DIAGNOSTIC FINDINGS:   COGNITION: Overall cognitive status: Within functional limits for tasks assessed    EDEMA:  None  POSTURE: rounded shoulders, forward head, increased thoracic kyphosis, and posterior pelvic tilt   PALPATION: tenderness/tightness into R UT,  tenderness in bil sub occipitals and cervical paraspinals.      LE Measurements Knee: 4/5 Hips: 4-/5  UE: 4/5   LOWER EXTREMITY SPECIAL TESTS:    FUNCTIONAL TESTS:     GAIT:    TODAY'S TREATMENT:  DATE:   6/18: ther ex: see below for HEP   PATIENT EDUCATION:  Education details: on current presentation, on HEP, and POC,  Person educated: Patient Education method: Explanation, Demonstration, and Handouts Education comprehension: verbalized understanding   HOME EXERCISE PROGRAM:  Access Code: [email or web address] URL: [email or web address] Date: 07/07/2022 Prepared by: Terrilee Few  Exercises - Supine Lower Trunk Rotation  - 2 x daily - 10 reps - 5 hold - Seated Ankle Pumps on Table  - 2 x daily - 1-2 sets - 10 reps - Supine Cervical Rotation AROM on Pillow  - 1 x daily - 1 sets - 5 reps   ASSESSMENT:  CLINICAL IMPRESSION: Patient presents to physical therapy with multiple complaints including neck pain, bilateral shoulder pain, back and thoracic pain and right lower extremity pain and weakness.  Pt with increased muscle tension in Bil UT s and cervical musculature with increased headaches. She has increased  pain in t-spine that radiates around into ribs. She also has low back pain and pain/weakness into R LE, with leg giving way numerous times. She has decreased ability for full functional activities due to pain, and will benefit from skilled PT to improve deficits and pain.   OBJECTIVE IMPAIRMENTS: Abnormal gait, decreased activity tolerance, decreased balance, decreased endurance, decreased knowledge of use of DME, decreased mobility, difficulty walking, decreased ROM, decreased strength, increased muscle spasms, impaired UE functional use, improper body mechanics, postural dysfunction, and pain.   ACTIVITY LIMITATIONS: carrying, lifting, bending, sitting, standing, squatting, stairs, transfers, reach over head, and locomotion level  PARTICIPATION LIMITATIONS: cleaning, laundry, shopping, and community activity  PERSONAL FACTORS: Age, Fitness, Past/current experiences, Time since onset of injury/illness/exacerbation, and 3+ comorbidities: x4 right knee surgeries, chest radiation, cervical fusion C3-7,  are also affecting patient's functional outcome.   REHAB POTENTIAL: Good  CLINICAL DECISION MAKING: Evolving/moderate complexity  EVALUATION COMPLEXITY: Moderate   GOALS: Goals reviewed with patient? yes  SHORT TERM GOALS: Target date: 07/23/2022   Patient will be independent in self management strategies to improve quality of life and functional outcomes. Goal status: INITIAL  2.  Patient will report at least  25% improvement in neck muscle tension and pain  to demonstrate improved functional mobility  Goal status: INITIAL    LONG TERM GOALS: Target date: 08/27/2022   Patient will report at least 50% improvement in overall neck and back  symptoms and/or function to demonstrate improved functional mobility Goal status: INITIAL  2.  Patient will demonstrate at least 4/5 lower extremity strength for hips and knees  Goal status: INITIAL  3.  Patient will demo ability for functional UE  use, reach, lift, carry and IADLs without pain greater than 3/10.   Goal status: INITIAL     PLAN:  PT FREQUENCY: 1-2x/week  PT DURATION: 12 weeks  PLANNED INTERVENTIONS: Therapeutic exercises, Therapeutic activity, Neuromuscular re-education, Balance training, Gait training, Patient/Family education, Self Care, Joint mobilization, Joint manipulation, Stair training, Vestibular training, Canalith repositioning, Orthotic/Fit training, Dry Needling, Electrical stimulation, Spinal manipulation, Spinal mobilization, Cryotherapy, Moist heat, Ultrasound, Ionotophoresis 4mg /ml Dexamethasone , Manual therapy, and Re-evaluation  PLAN FOR NEXT SESSION:    Terrilee Few, PT, DPT 6:36 PM  07/07/22  PHYSICAL THERAPY DISCHARGE SUMMARY  Visits from Start of Care: 1   Plan: Patient agrees to discharge.  Patient goals were not met. Patient is being discharged due to- Pt not returning after eval visit.   Terrilee Few, PT, DPT 10:27 AM  06/16/23

## 2022-07-07 ENCOUNTER — Encounter: Payer: Self-pay | Admitting: Physical Therapy

## 2022-07-08 ENCOUNTER — Encounter: Payer: Self-pay | Admitting: Physician Assistant

## 2022-07-08 ENCOUNTER — Ambulatory Visit (INDEPENDENT_AMBULATORY_CARE_PROVIDER_SITE_OTHER): Payer: Medicare HMO | Admitting: Physician Assistant

## 2022-07-08 VITALS — BP 112/73 | HR 64 | Temp 96.0°F | Ht 64.0 in | Wt 171.6 lb

## 2022-07-08 DIAGNOSIS — Z794 Long term (current) use of insulin: Secondary | ICD-10-CM | POA: Diagnosis not present

## 2022-07-08 DIAGNOSIS — R6889 Other general symptoms and signs: Secondary | ICD-10-CM | POA: Diagnosis not present

## 2022-07-08 DIAGNOSIS — Z170 Estrogen receptor positive status [ER+]: Secondary | ICD-10-CM | POA: Diagnosis not present

## 2022-07-08 DIAGNOSIS — C50412 Malignant neoplasm of upper-outer quadrant of left female breast: Secondary | ICD-10-CM | POA: Diagnosis not present

## 2022-07-08 DIAGNOSIS — E119 Type 2 diabetes mellitus without complications: Secondary | ICD-10-CM

## 2022-07-08 DIAGNOSIS — R61 Generalized hyperhidrosis: Secondary | ICD-10-CM

## 2022-07-08 DIAGNOSIS — R197 Diarrhea, unspecified: Secondary | ICD-10-CM

## 2022-07-08 LAB — POCT GLYCOSYLATED HEMOGLOBIN (HGB A1C): Hemoglobin A1C: 10.9 % — AB (ref 4.0–5.6)

## 2022-07-08 MED ORDER — LANTUS SOLOSTAR 100 UNIT/ML ~~LOC~~ SOPN
PEN_INJECTOR | SUBCUTANEOUS | 11 refills | Status: DC
Start: 2022-07-08 — End: 2022-09-04

## 2022-07-08 MED ORDER — FREESTYLE LIBRE 14 DAY SENSOR MISC
11 refills | Status: DC
Start: 2022-07-08 — End: 2022-10-23

## 2022-07-08 MED ORDER — FREESTYLE LIBRE 14 DAY READER DEVI
1.0000 | 11 refills | Status: DC
Start: 2022-07-08 — End: 2022-11-20

## 2022-07-08 NOTE — Progress Notes (Signed)
Subjective:    Patient ID: Felicia [name], female    DOB: [birth year], 72 y.o.   MRN: [id number]  Chief Complaint  Patient presents with   Follow-up    Follow up diabetes , discuss medication , having diaherra about 2 times a day  every couple days  , leg cramps  was told by PT that she need to work on drink more water,    HPI Patient is in today for recheck. She stopped Mounjaro and not having night sweats or major depression anymore.   Started PT with Lauren for neck pain. Also was told she needed to drink more water, was drinking close to 10 sodas per day to help quench thirst. In process of trying to improve water intake.   Diarrhea - several times a day - sometimes affects ADLs.  She is taking iron twice daily.  Not getting up at night for bowel movements or urinating.   Denies any abdominal pain. No nausea or vomiting.   She has not had anything to eat or drink today.  POC glucose is 264.   Past Medical History:  Diagnosis Date   Anemia    Anxiety    Breast cancer (HCC) 09/2019   left breast IMC   CAD (coronary artery disease)    a. 3/2007s/p DES to the LAD [facility]); b. 01/2017 MV: EF 80%, small, mild apical ant defect w/o ischemia (felt to be breast atten). Low risk.   Depression    DM (diabetes mellitus) (HCC)    Essential hypertension    GERD (gastroesophageal reflux disease)    Headache    secondary to neck surgery per patient   High triglycerides    Hyperlipidemia    Hypothyroid    OA (osteoarthritis)    right knee,hands   Overflow incontinence    PONV (postoperative nausea and vomiting)    s/p gallbladder    RLS (restless legs syndrome)    Urinary urgency    Uterine cancer (HCC) dx'd 2014    Past Surgical History:  Procedure Laterality Date   BREAST LUMPECTOMY WITH RADIOACTIVE SEED AND SENTINEL LYMPH NODE BIOPSY Left 10/07/2019   Procedure: LEFT BREAST LUMPECTOMY WITH RADIOACTIVE SEED AND SENTINEL LYMPH NODE BIOPSY;  Surgeon: Emelia Loron, MD;  Location: [facility];  Service: General;  Laterality: Left;   CORONARY STENT PLACEMENT     CORONARY/GRAFT ACUTE MI REVASCULARIZATION     FINGER ARTHRODESIS Right 06/11/2019   Procedure: ARTHRODESIS INDEX FINGER DISTAL PHALANGEAL JOINT;  Surgeon: Betha Loa, MD;  Location: [facility];  Service: Orthopedics;  Laterality: Right;   FOOT SURGERY Left    LEFT HEART CATH AND CORONARY ANGIOGRAPHY N/A 02/16/2018   Procedure: LEFT HEART CATH AND CORONARY ANGIOGRAPHY;  Surgeon: Runell Gess, MD;  [facility];  Service: Cardiovascular;  Laterality: N/A;   LEFT HEART CATH AND CORONARY ANGIOGRAPHY N/A 05/01/2020   Procedure: LEFT HEART CATH AND CORONARY ANGIOGRAPHY;  Surgeon: Runell Gess, MD;  [facility];  Service: Cardiovascular;  Laterality: N/A;   PORTACATH PLACEMENT Right 11/23/2019   Procedure: INSERTION PORT-A-CATH WITH ULTRASOUND GUIDANCE;  Surgeon: Emelia Loron, MD;  Location: [facility];  Service: General;  Laterality: Right;   REPLACEMENT TOTAL KNEE Right    SPINE SURGERY     TOTAL ABDOMINAL HYSTERECTOMY     FOR UTERUS CANCER   TOTAL HIP ARTHROPLASTY Left 2015    Family History  Problem Relation Age of  Onset   AAA (abdominal aortic aneurysm) Mother    Diabetes Father    Alzheimer's disease Father    Throat cancer Sister    Breast cancer Cousin     Social History   Tobacco Use   Smoking status: Never   Smokeless tobacco: Never  Vaping Use   Vaping Use: Never used  Substance Use Topics   Alcohol use: Yes    Comment: OCCASIONALLY   Drug use: No     Allergies  Allergen Reactions   Chloraprep One Step [Chlorhexidine Gluconate] Rash   Estrogens Other (See Comments)    PATIENT HAS A HISTORY OF CANCER AND HAS BEEN TOLD TO NEVER TAKE ANYTHING CONTAINING ESTROGEN, AS IT MIGHT CAUSE A RECURRENCE   Shrimp [Shellfish Allergy] Nausea And Vomiting    Review of  Systems NEGATIVE UNLESS OTHERWISE INDICATED IN HPI      Objective:     BP 112/73   Pulse 64   Temp (!) 96 F (35.6 C)   Ht 5\' 4"  (1.626 m)   Wt 171 lb 9.6 oz (77.8 kg)   SpO2 96%   BMI 29.46 kg/m   Wt Readings from Last 3 Encounters:  07/08/22 171 lb 9.6 oz (77.8 kg)  06/20/22 178 lb 3.2 oz (80.8 kg)  06/06/22 171 lb 9.6 oz (77.8 kg)    BP Readings from Last 3 Encounters:  07/08/22 112/73  06/24/22 126/66  06/20/22 136/70     Physical Exam Constitutional:      General: She is not in acute distress.    Appearance: Normal appearance. She is not ill-appearing or toxic-appearing.  HENT:     Right Ear: Tympanic membrane normal.     Left Ear: Tympanic membrane normal.     Nose: Nose normal.     Mouth/Throat:     Mouth: Mucous membranes are moist.  Eyes:     Extraocular Movements: Extraocular movements intact.     Conjunctiva/sclera: Conjunctivae normal.     Pupils: Pupils are equal, round, and reactive to light.  Cardiovascular:     Rate and Rhythm: Normal rate and regular rhythm.     Pulses: Normal pulses.     Heart sounds: Normal heart sounds.  Pulmonary:     Effort: Pulmonary effort is normal.     Breath sounds: Normal breath sounds.  Musculoskeletal:     Right lower leg: No edema.     Left lower leg: No edema.  Skin:    Findings: No rash.  Neurological:     General: No focal deficit present.     Mental Status: She is alert.     Cranial Nerves: No cranial nerve deficit.     Sensory: No sensory deficit.     Motor: No weakness.     Coordination: Coordination normal.     Gait: Gait normal.  Psychiatric:        Mood and Affect: Mood normal.        Behavior: Behavior normal.        Thought Content: Thought content normal.        Assessment & Plan:  Type 2 diabetes mellitus without complication, without long-term current use of insulin (HCC) -     POCT glycosylated hemoglobin (Hb A1C) -     FreeStyle Libre 14 Day Sensor; APPLY EVERY 14 DAYS TO CHECK  BLOOD SUGAR  Dispense: 1 each; Refill: 11 -     FreeStyle Libre 14 Day Reader; Apply 1 each topically continuous. Apply reader for monitoring  of continuous glucose monitoring.  Dispense: 1 each; Refill: 11 -     Lantus SoloStar; Start at 4 units in the evenings. Increase by 2 units every 2 days until morning fasting glucose is to goal (140).  Dispense: 15 mL; Refill: 11  Hypercalcemia  Night sweat  Malignant neoplasm of upper-outer quadrant of left breast in female, estrogen receptor positive (HCC)  Diarrhea, unspecified type   Glad her night sweats and overall not feeling well resolved, this seems to have been from the [facility].  Still having Diarrhea - likely Metformin, will consider decreasing this medication once insulin is loaded.  Recommended she talk with Dr. Pamelia Hoit at upcoming visit in July about PTH levels and CT / bone scan. Offered to order for her today, but she said she would wait to see him and go from there.     F/up as scheduled with me   This note was prepared with assistance of Dragon voice recognition software. Occasional wrong-word or sound-a-like substitutions may have occurred due to the inherent limitations of voice recognition software.  Time Spent: 39 minutes of total time was spent on the date of the encounter performing the following actions: chart review prior to seeing the patient, obtaining history, performing a medically necessary exam, counseling on the treatment plan, placing orders, and documenting in our EHR.       Sakari Alkhatib M Benjamim Harnish, PA-C

## 2022-07-08 NOTE — Patient Instructions (Addendum)
I'm glad you are feeling better!  I would still talk with Dr. Pamelia Hoit at your upcoming visit about PTH levels and CT / bone scan.   Your diarrhea may be coming from the metformin. I would like to reduce this medication, but we need to get your glucose levels in a better range first. Let's start on insulin at night - 4 units, then increase by 2 units every 2 days until morning fasting glucose is around 140.  I am trying to get a sensor covered for you as well to help with monitoring (and fewer needle sticks!).   Thank you for the cards. Always such a joy to see you!

## 2022-07-09 ENCOUNTER — Telehealth: Payer: Self-pay | Admitting: Physician Assistant

## 2022-07-09 NOTE — Telephone Encounter (Signed)
Pt would like a call back with regards to her Metformin, she wants to know, since now she is taking insulin, does she still have to take her Metformin. Please advise.

## 2022-07-09 NOTE — Telephone Encounter (Signed)
Called and spoke with patient advised her Per Arlyss Repress is she is to keep taking the metformin along with insulin for next couple , to see how she will do on the insulin , if cbg are under control she will be taking of Metformin. Pt said that she understood and she will follow up her next appt.

## 2022-07-11 ENCOUNTER — Encounter: Payer: Medicare HMO | Admitting: Physical Therapy

## 2022-07-11 DIAGNOSIS — R6889 Other general symptoms and signs: Secondary | ICD-10-CM | POA: Diagnosis not present

## 2022-07-12 ENCOUNTER — Telehealth: Payer: Self-pay | Admitting: Physician Assistant

## 2022-07-12 MED ORDER — PEN NEEDLES 32G X 4 MM MISC: 3 refills | Status: DC

## 2022-07-12 NOTE — Telephone Encounter (Signed)
Patient states she received lantus medication but doesn't have her needles that go with it. [facility]

## 2022-07-12 NOTE — Telephone Encounter (Signed)
Spoke to pt told her Rx for pen needles was sent to [facility]. Pt verbalized understanding.

## 2022-07-14 NOTE — Assessment & Plan Note (Signed)
Lab Results  Component Value Date   HGBA1C 10.9 (A) 07/08/2022   HGBA1C 7.4 (A) 03/26/2022   HGBA1C 7.3 (A) 12/24/2021   Currently uncontrolled, had to stop Mounjaro due to side effects, but she reports feeling better since stopping. Diarrhea may be coming from the metformin. I would like to reduce this medication, but we need to get your glucose levels in a better range first. Will start on Lantus insulin at night - 4 units, then increase by 2 units every 2 days until morning fasting glucose is around 140. -FreeStyle to help monitor progress  I am trying to get a sensor covered for you as well to help with monitoring (and fewer needle sticks!).

## 2022-07-16 ENCOUNTER — Other Ambulatory Visit: Payer: Self-pay | Admitting: Physician Assistant

## 2022-07-22 ENCOUNTER — Ambulatory Visit: Payer: Medicare HMO | Attending: General Surgery

## 2022-07-22 ENCOUNTER — Other Ambulatory Visit: Payer: Self-pay | Admitting: Physician Assistant

## 2022-07-22 VITALS — Wt 175.5 lb

## 2022-07-22 DIAGNOSIS — Z483 Aftercare following surgery for neoplasm: Secondary | ICD-10-CM | POA: Insufficient documentation

## 2022-07-22 DIAGNOSIS — R6889 Other general symptoms and signs: Secondary | ICD-10-CM | POA: Diagnosis not present

## 2022-07-22 NOTE — Therapy (Signed)
OUTPATIENT PHYSICAL THERAPY SOZO SCREENING NOTE   Patient Name: Felicia [name] MRN: [id number] DOB:01-29-1950, 72 y.o., female Today's Date: 07/22/2022  PCP: Bary Leriche, PA-C REFERRING PROVIDER: Emelia Loron, MD   PT End of Session - 07/22/22 1425     Visit Number 1   # unchanged due to screen only   PT Start Time 1416    PT Stop Time 1420    PT Time Calculation (min) 4 min    Activity Tolerance Patient tolerated treatment well    Behavior During Therapy [facility] for tasks assessed/performed             Past Medical History:  Diagnosis Date   Anemia    Anxiety    Breast cancer (HCC) 09/2019   left breast IMC   CAD (coronary artery disease)    a. 3/2007s/p DES to the LAD [facility]); b. 01/2017 MV: EF 80%, small, mild apical ant defect w/o ischemia (felt to be breast atten). Low risk.   Depression    DM (diabetes mellitus) (HCC)    Essential hypertension    GERD (gastroesophageal reflux disease)    Headache    secondary to neck surgery per patient   High triglycerides    Hyperlipidemia    Hypothyroid    OA (osteoarthritis)    right knee,hands   Overflow incontinence    PONV (postoperative nausea and vomiting)    s/p gallbladder    RLS (restless legs syndrome)    Urinary urgency    Uterine cancer (HCC) dx'd 2014   Past Surgical History:  Procedure Laterality Date   BREAST LUMPECTOMY WITH RADIOACTIVE SEED AND SENTINEL LYMPH NODE BIOPSY Left 10/07/2019   Procedure: LEFT BREAST LUMPECTOMY WITH RADIOACTIVE SEED AND SENTINEL LYMPH NODE BIOPSY;  Surgeon: Emelia Loron, MD;  Location: [facility];  Service: General;  Laterality: Left;   CORONARY STENT PLACEMENT     CORONARY/GRAFT ACUTE MI REVASCULARIZATION     FINGER ARTHRODESIS Right 06/11/2019   Procedure: ARTHRODESIS INDEX FINGER DISTAL PHALANGEAL JOINT;  Surgeon: Betha Loa, MD;  Location: [facility];  Service: Orthopedics;  Laterality: Right;   FOOT SURGERY Left     LEFT HEART CATH AND CORONARY ANGIOGRAPHY N/A 02/16/2018   Procedure: LEFT HEART CATH AND CORONARY ANGIOGRAPHY;  Surgeon: Runell Gess, MD;  [facility];  Service: Cardiovascular;  Laterality: N/A;   LEFT HEART CATH AND CORONARY ANGIOGRAPHY N/A 05/01/2020   Procedure: LEFT HEART CATH AND CORONARY ANGIOGRAPHY;  Surgeon: Runell Gess, MD;  [facility];  Service: Cardiovascular;  Laterality: N/A;   PORTACATH PLACEMENT Right 11/23/2019   Procedure: INSERTION PORT-A-CATH WITH ULTRASOUND GUIDANCE;  Surgeon: Emelia Loron, MD;  Location: [facility];  Service: General;  Laterality: Right;   REPLACEMENT TOTAL KNEE Right    SPINE SURGERY     TOTAL ABDOMINAL HYSTERECTOMY     FOR UTERUS CANCER   TOTAL HIP ARTHROPLASTY Left 2015   Patient Active Problem List   Diagnosis Date Noted   Erosive osteoarthritis of multiple sites 09/26/2021   Frequent headaches 05/31/2021   Bilateral impacted cerumen 02/22/2021   Nasal septal perforation 02/22/2021   Postnasal drip 02/22/2021   Hoarse 01/25/2021   Hyperglycemia due to type 2 diabetes mellitus (HCC) 01/18/2021   Mixed simple and mucopurulent chronic bronchitis (HCC) 12/27/2020   Centrilobular emphysema (HCC) 07/31/2020   Shortness of breath 07/31/2020   Pressure injury of skin 04/30/2020   Hypothyroid  Anxiety    Anxiety and depression    Port-A-Cath in place 11/24/2019   Hepatic steatosis 09/28/2019   Malignant neoplasm of upper-outer quadrant of left breast in female, estrogen receptor positive (HCC) 09/02/2019   Trigger index finger of right hand 05/05/2019   Primary osteoarthritis of first carpometacarpal joint of right hand 05/05/2019   Osteoarthritis of finger of right hand 05/05/2019   Endometrial cancer (HCC) 10/23/2018   Cirrhosis of liver without ascites (HCC) 10/23/2018   Diarrhea 10/23/2018   Chest pain 02/15/2018   Coronary artery disease involving native coronary artery  of native heart without angina pectoris 11/18/2017   Hyperlipidemia LDL goal <70 11/18/2017   Type 2 diabetes mellitus without complication, without long-term current use of insulin (HCC) 11/18/2017   Dizziness 11/18/2017    REFERRING DIAG: left breast cancer at risk for lymphedema  THERAPY DIAG:  Aftercare following surgery for neoplasm  PERTINENT HISTORY:  Patient was diagnosed on 08/03/2019 with 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 She underwent a left lumpectomy and 1 positive node removed on 10/07/2019. Ki67 is 20%. She has a history of uterine cancer in 2014 treated with hysterectomy and radiation, a left hip replacement, right knee replacement, and a C4-C7 fusion in 2014. Diabetes, history of breast cancer (LEFT), history of endometrial cancer (with history of radiation), spine surgery C3-C7 fusion 2017, heart disease, L THA 2015, history of 3 right knee scopes and right TKA, history of inner ear issue (required epley maneuver),   PRECAUTIONS: left UE Lymphedema risk, None  SUBJECTIVE: Pt returns for her 6 month L-Dex screen.   PAIN:  Are you having pain? No  SOZO SCREENING: Patient was assessed today using the SOZO machine to determine the lymphedema index score. This was compared to her baseline score. It was determined that she is within the recommended range when compared to her baseline and no further action is needed at this time. She will continue SOZO screenings. These are done every 3 months for 2 years post operatively followed by every 6 months for 2 years, and then annually.   L-DEX FLOWSHEETS - 07/22/22 1400       L-DEX LYMPHEDEMA SCREENING   Measurement Type Unilateral    L-DEX MEASUREMENT EXTREMITY Upper Extremity    POSITION  Standing    DOMINANT SIDE Right    At Risk Side Left    BASELINE SCORE (UNILATERAL) 3.3    L-DEX SCORE (UNILATERAL) 7.4    VALUE CHANGE (UNILAT) 4.1               Hermenia Bers,  PTA 07/22/2022, 2:26 PM

## 2022-07-23 ENCOUNTER — Encounter: Payer: Self-pay | Admitting: Physician Assistant

## 2022-07-23 ENCOUNTER — Ambulatory Visit (INDEPENDENT_AMBULATORY_CARE_PROVIDER_SITE_OTHER): Payer: Medicare HMO | Admitting: Physician Assistant

## 2022-07-23 ENCOUNTER — Telehealth: Payer: Self-pay | Admitting: Physician Assistant

## 2022-07-23 VITALS — BP 130/76 | HR 76 | Temp 97.3°F | Ht 64.0 in | Wt 176.8 lb

## 2022-07-23 DIAGNOSIS — E119 Type 2 diabetes mellitus without complications: Secondary | ICD-10-CM | POA: Diagnosis not present

## 2022-07-23 DIAGNOSIS — Z794 Long term (current) use of insulin: Secondary | ICD-10-CM | POA: Diagnosis not present

## 2022-07-23 DIAGNOSIS — Z5941 Food insecurity: Secondary | ICD-10-CM

## 2022-07-23 DIAGNOSIS — R6889 Other general symptoms and signs: Secondary | ICD-10-CM | POA: Diagnosis not present

## 2022-07-23 DIAGNOSIS — Z7984 Long term (current) use of oral hypoglycemic drugs: Secondary | ICD-10-CM | POA: Diagnosis not present

## 2022-07-23 DIAGNOSIS — R519 Headache, unspecified: Secondary | ICD-10-CM | POA: Diagnosis not present

## 2022-07-23 DIAGNOSIS — Z5982 Transportation insecurity: Secondary | ICD-10-CM

## 2022-07-23 NOTE — Telephone Encounter (Signed)
Last OV: 07/08/22  Next OV: 07/23/22  Last Filled: 04/26/22  Quantity: 90 w/ 0 refills

## 2022-07-23 NOTE — Progress Notes (Unsigned)
Subjective:    Patient ID: Felicia [name], female    DOB: 26-May-1950, 73 y.o.   MRN: [id number]  Chief Complaint  Patient presents with   Diabetes    Pt c/o still not sleeping well and no changes since last visit; pt c/o still having headaches and diarrhea; pt concerned with no more transportation for the rest of the year and may need some assistance. Cancelled appts for Dr Denyse Amass and Cancer appts and foot doctor due to transportation needs.     Diabetes   Patient is in today for recheck. She stopped Mounjaro and not having night sweats or major depression anymore.   Started PT with Lauren for neck pain. Also was told she needed to drink more water, was drinking close to 10 sodas per day to help quench thirst. In process of trying to improve water intake.   Diarrhea intermittent - some days better than others.  She is taking iron twice daily.  Not getting up at night for bowel movements or urinating.  Denies any abdominal pain. No nausea or vomiting.   Used up all of her transportation through her insurance for the year.  She says children still working on her car.  No longer getting food stamps either.   Intermittent sharp pains R or L side of head, sometimes posterior. History of neck issues - was going to do PT, but can't afford it right now, having dental work done at this time as well.    Current glucose per [facility] is 364, only had tonic water today and toast.  Diet mostly carb based - bagels, sandwiches, lasagna.    Past Medical History:  Diagnosis Date   Anemia    Anxiety    Breast cancer (HCC) 09/2019   left breast IMC   CAD (coronary artery disease)    a. 3/2007s/p DES to the [facility]); b. 01/2017 MV: EF 80%, small, mild apical ant defect w/o ischemia (felt to be breast atten). Low risk.   Depression    DM (diabetes mellitus) (HCC)    Essential hypertension    GERD (gastroesophageal reflux disease)    Headache    secondary to neck surgery per  patient   High triglycerides    Hyperlipidemia    Hypothyroid    OA (osteoarthritis)    right knee,hands   Overflow incontinence    PONV (postoperative nausea and vomiting)    s/p gallbladder    RLS (restless legs syndrome)    Urinary urgency    Uterine cancer (HCC) dx'd 2014    Past Surgical History:  Procedure Laterality Date   BREAST LUMPECTOMY WITH RADIOACTIVE SEED AND SENTINEL LYMPH NODE BIOPSY Left 10/07/2019   Procedure: LEFT BREAST LUMPECTOMY WITH RADIOACTIVE SEED AND SENTINEL LYMPH NODE BIOPSY;  Surgeon: Emelia Loron, MD;  Location: [facility];  Service: General;  Laterality: Left;   CORONARY STENT PLACEMENT     CORONARY/GRAFT ACUTE MI REVASCULARIZATION     FINGER ARTHRODESIS Right 06/11/2019   Procedure: ARTHRODESIS INDEX FINGER DISTAL PHALANGEAL JOINT;  Surgeon: Betha Loa, MD;  Location: [facility];  Service: Orthopedics;  Laterality: Right;   FOOT SURGERY Left    LEFT HEART CATH AND CORONARY ANGIOGRAPHY N/A 02/16/2018   Procedure: LEFT HEART CATH AND CORONARY ANGIOGRAPHY;  Surgeon: Runell Gess, MD;  [facility];  Service: Cardiovascular;  Laterality: N/A;   LEFT HEART CATH AND CORONARY ANGIOGRAPHY N/A 05/01/2020   Procedure: LEFT HEART CATH AND  CORONARY ANGIOGRAPHY;  Surgeon: Runell Gess, MD;  [facility];  Service: Cardiovascular;  Laterality: N/A;   PORTACATH PLACEMENT Right 11/23/2019   Procedure: INSERTION PORT-A-CATH WITH ULTRASOUND GUIDANCE;  Surgeon: Emelia Loron, MD;  Location: [facility];  Service: General;  Laterality: Right;   REPLACEMENT TOTAL KNEE Right    SPINE SURGERY     TOTAL ABDOMINAL HYSTERECTOMY     FOR UTERUS CANCER   TOTAL HIP ARTHROPLASTY Left 2015    Family History  Problem Relation Age of Onset   AAA (abdominal aortic aneurysm) Mother    Diabetes Father    Alzheimer's disease Father    Throat cancer Sister    Breast cancer Cousin      Social History   Tobacco Use   Smoking status: Never   Smokeless tobacco: Never  Vaping Use   Vaping Use: Never used  Substance Use Topics   Alcohol use: Yes    Comment: OCCASIONALLY   Drug use: No     Allergies  Allergen Reactions   Chloraprep One Step [Chlorhexidine Gluconate] Rash   Estrogens Other (See Comments)    PATIENT HAS A HISTORY OF CANCER AND HAS BEEN TOLD TO NEVER TAKE ANYTHING CONTAINING ESTROGEN, AS IT MIGHT CAUSE A RECURRENCE   Shrimp [Shellfish Allergy] Nausea And Vomiting    Review of Systems NEGATIVE UNLESS OTHERWISE INDICATED IN HPI      Objective:     BP 130/76 (BP Location: Left Arm)   Pulse 76   Temp (!) 97.3 F (36.3 C) (Temporal)   Ht 5\' 4"  (1.626 m)   Wt 176 lb 12.8 oz (80.2 kg)   SpO2 96%   BMI 30.35 kg/m   Wt Readings from Last 3 Encounters:  07/23/22 176 lb 12.8 oz (80.2 kg)  07/22/22 175 lb 8 oz (79.6 kg)  07/08/22 171 lb 9.6 oz (77.8 kg)    BP Readings from Last 3 Encounters:  07/23/22 130/76  07/08/22 112/73  06/24/22 126/66     Physical Exam Constitutional:      General: She is not in acute distress.    Appearance: Normal appearance. She is not ill-appearing or toxic-appearing.  HENT:     Right Ear: Tympanic membrane normal.     Left Ear: Tympanic membrane normal.     Nose: Nose normal.     Mouth/Throat:     Mouth: Mucous membranes are moist.  Eyes:     Extraocular Movements: Extraocular movements intact.     Conjunctiva/sclera: Conjunctivae normal.     Pupils: Pupils are equal, round, and reactive to light.  Cardiovascular:     Rate and Rhythm: Normal rate and regular rhythm.     Pulses: Normal pulses.     Heart sounds: Normal heart sounds.  Pulmonary:     Effort: Pulmonary effort is normal.     Breath sounds: Normal breath sounds.  Musculoskeletal:     Right lower leg: No edema.     Left lower leg: No edema.  Skin:    Findings: No rash.  Neurological:     General: No focal deficit present.      Mental Status: She is alert.     Cranial Nerves: No cranial nerve deficit.     Sensory: No sensory deficit.     Motor: No weakness.     Coordination: Coordination normal.     Gait: Gait normal.  Psychiatric:        Mood and Affect: Mood is depressed.  Behavior: Behavior normal.        Thought Content: Thought content normal.        Assessment & Plan:  Diabetes mellitus treated with insulin and oral medication (HCC) Assessment & Plan: Lab Results  Component Value Date   HGBA1C 10.9 (A) 07/08/2022   HGBA1C 7.4 (A) 03/26/2022   HGBA1C 7.3 (A) 12/24/2021   Currently uncontrolled, had to stop Mounjaro due to side effects, but she reports feeling better since stopping. Diarrhea may be coming from the metformin.  Referral sent to nutritionist  We need to work on lowering carb-based diet, incorporate more proteins / veggies.   Currently at Lantus 20 units every night; Increase insulin 2 units every 2 days until morning AM fasting glucose is under 200. Please message me at this time when numbers are looking better. -FreeStyle to help monitor progress  Cont Glipizide XL 10 mg Jardiance 25 mg    Orders: -     Amb Referral to Nutrition and Diabetic Education  Frequent headaches Assessment & Plan: Ongoing, no red flags, but persistent.  Has seen Dr. Everlena Cooper, neurology, like coming from chronic neck issues; she will follow up with him later this year  MRI brain negative last year    Transportation insecurity  Food insecurity   I sent a message to her social worker today in regard to the transportation and food insecurities.    F/up as scheduled with me   This note was prepared with assistance of Dragon voice recognition software. Occasional wrong-word or sound-a-like substitutions may have occurred due to the inherent limitations of voice recognition software.  Time Spent: 39 minutes of total time was spent on the date of the encounter performing the following  actions: chart review prior to seeing the patient, obtaining history, performing a medically necessary exam, counseling on the treatment plan, placing orders, and documenting in our EHR.       Abagail Limb M Ladoris Lythgoe, PA-C

## 2022-07-23 NOTE — Telephone Encounter (Signed)
See phone note

## 2022-07-23 NOTE — Patient Instructions (Addendum)
Referral sent to nutritionist  We need to work on lowering carb-based diet, incorporate more proteins / veggies.   Increase insulin 2 units every 2 days until morning AM fasting glucose is under 200. Please message me at this time when numbers are looking better.  We are going to reach out to your social worker you worked with last year for help with food and transportation.   It is strongly advised to keep your medical appointments as best as able.

## 2022-07-24 ENCOUNTER — Ambulatory Visit: Payer: Self-pay | Admitting: Licensed Clinical Social Worker

## 2022-07-24 NOTE — Patient Instructions (Signed)
  It was a pleasure speaking with you today. Per your request a Care Coordination phone appointment is scheduled 07/31/22  Sammuel Hines, LCSW Social Work Care Coordination  [phone]

## 2022-07-24 NOTE — Assessment & Plan Note (Signed)
Ongoing, no red flags, but persistent.  Has seen Dr. Everlena Cooper, neurology, like coming from chronic neck issues; she will follow up with him later this year  MRI brain negative last year

## 2022-07-24 NOTE — Patient Outreach (Signed)
  Care Coordination  Initial Visit Note   07/24/2022 Name: Zakeria Kulzer MRN: [id number] DOB: 01-08-1951  Kenadie Royce is a 72 y.o. year old female who sees Allwardt, Crist Infante, PA-C for primary care. I spoke with  [name] by phone today.  What matters to the patients health and wellness today?  Received referral from patient's PCP   SDOH assessments and interventions completed:  No  Care Coordination Interventions:  No, not indicated   Follow up plan: Follow up call scheduled for 07/31/22    Encounter Outcome:  Pt. Visit Completed

## 2022-07-24 NOTE — Assessment & Plan Note (Signed)
Lab Results  Component Value Date   HGBA1C 10.9 (A) 07/08/2022   HGBA1C 7.4 (A) 03/26/2022   HGBA1C 7.3 (A) 12/24/2021   Currently uncontrolled, had to stop Mounjaro due to side effects, but she reports feeling better since stopping. Diarrhea may be coming from the metformin.  Referral sent to nutritionist  We need to work on lowering carb-based diet, incorporate more proteins / veggies.   Currently at Lantus 20 units every night; Increase insulin 2 units every 2 days until morning AM fasting glucose is under 200. Please message me at this time when numbers are looking better. -FreeStyle to help monitor progress  Cont Glipizide XL 10 mg Jardiance 25 mg

## 2022-07-25 ENCOUNTER — Ambulatory Visit: Payer: Medicare HMO | Admitting: Podiatry

## 2022-07-26 ENCOUNTER — Encounter: Payer: Self-pay | Admitting: Physician Assistant

## 2022-07-26 ENCOUNTER — Ambulatory Visit: Payer: Medicare HMO | Admitting: Physician Assistant

## 2022-07-26 DIAGNOSIS — R6889 Other general symptoms and signs: Secondary | ICD-10-CM | POA: Diagnosis not present

## 2022-07-29 ENCOUNTER — Telehealth: Payer: Self-pay | Admitting: Hematology and Oncology

## 2022-07-29 NOTE — Telephone Encounter (Signed)
 Rescheduled appointment per provider on-call. Patient is aware of the changes made to her upcoming appointment.

## 2022-07-31 ENCOUNTER — Ambulatory Visit: Payer: Self-pay | Admitting: Licensed Clinical Social Worker

## 2022-07-31 ENCOUNTER — Ambulatory Visit: Payer: Medicare HMO | Admitting: Family Medicine

## 2022-07-31 NOTE — Patient Outreach (Signed)
  Care Coordination  Initial Visit Note   07/31/2022 Name: Felicia [name] MRN: [id number] DOB: May 03, 1950  Felicia [name] is a 72 y.o. year old female who sees Allwardt, Crist Infante, PA-C for primary care. I spoke with  [name] by phone today.  What matters to the patients health and wellness today?  Being able to get to her medical appointments.  Patient has used all of the yearly transportation trips with her insurance provider.  She has cancelled the last several medical appointments due to transportation barriers. Also experiencing hardship due to having to pay for food, food stamps ended and she is unsure why and what she needs to do to get them back.  Reports she applied for Medicaid a few months ago and is unsure of the status.   Goals Addressed             This Visit's Progress    connect with community resources and diabetes education       Activities and task to complete in order to accomplish goals.   Keep all upcoming appointment discussed today Continue with compliance of taking medication prescribed by Doctor I have contacted the oncology social work team, they will assist you with transportation to your oncology appointment I have scheduled a phone appointment with the RN Care Manager she will provide educational informational and assist you with managing your diabetes You also have a phone appointment with Enrique Sack, she will assist you with barriers related to transportation, food and Medicaid         SDOH assessments and interventions completed:  Yes  SDOH Interventions Today    Flowsheet Row Most Recent Value  SDOH Interventions   Food Insecurity Interventions Intervention Not Indicated  Transportation Interventions Payor Benefit, SCAT ([employer] Community [employer]), AMB Referral  [referral to BSW]  Financial Strain Interventions Intervention Not Indicated  Stress Interventions Intervention Not Indicated, Other (Comment)       Care Coordination  Interventions:  Yes, provided  Interventions Today    Flowsheet Row Most Recent Value  Chronic Disease   Chronic disease during today's visit Diabetes  General Interventions   General Interventions Discussed/Reviewed General Interventions Reviewed, Referral to Nurse, Communication with  Communication with Social Work  Human resources officer and Oncology social worker]  Education Interventions   Education Provided Provided Education  Provided Verbal Education On [employer], Other, Mental Health/Coping with Illness  Mental Health Interventions   Mental Health Discussed/Reviewed Mental Health Reviewed, Coping Strategies  [patient reports doing well with managing symptoms of anxiety and depression]  Nutrition Interventions   Nutrition Discussed/Reviewed Nutrition Discussed       Follow up plan:  No further intervention required for this social worker Referral made to RN care manager for diabetes education and management.  Referral also sent to [facility] Social Worker for assistance with upcoming Oncology appointment July 22nd  Follow up call scheduled for July 25th with BSW  Encounter Outcome:  Pt. Visit Completed   Sammuel Hines, LCSW Social Work Care Coordination  [facility] [employer] [phone]

## 2022-07-31 NOTE — Patient Instructions (Signed)
Social Work Visit Information  Thank you for taking time to visit with me today. Please don't hesitate to contact me if I can be of assistance to you.   Following are the goals we discussed today:   Goals Addressed             This Visit's Progress    connect with community resources and diabetes education       Activities and task to complete in order to accomplish goals.   Keep all upcoming appointment discussed today Continue with compliance of taking medication prescribed by Doctor I have contacted the oncology social work team, they will assist you with transportation to your oncology appointment I have scheduled a phone appointment with the RN Care Manager she will provide educational informational and assist you with managing your diabetes You also have a phone appointment with Enrique Sack, she will assist you with barriers related to transportation, food and Medicaid         No follow up scheduled with social work at this time. Patient will call office if needed. F/u appointments scheduled with RN care manager and BSW care coordinator   Please call the care guide team at [phone] if you need to cancel or reschedule your appointment.   If you or anyone you know are experiencing a Mental Health or Behavioral Health Crisis or need someone to talk to, please call the Suicide and Crisis Lifeline: 988 call the [place] National Suicide Prevention Lifeline: 559-232-4908 or TTY: 938-684-1168 TTY (380)534-4302) to talk to a trained counselor call 1-800-273-TALK (toll free, 24 hour hotline) go to [facility] [address] [place] [phone])   Patient verbalizes understanding of instructions and care plan provided today and agrees to view in MyChart. Active MyChart status and patient understanding of how to access instructions and care plan via MyChart confirmed with patient.       Sammuel Hines, LCSW Social Work Care Coordination  [facility] [employer] [employer] [phone]

## 2022-08-01 ENCOUNTER — Encounter: Payer: Self-pay | Admitting: Licensed Clinical Social Worker

## 2022-08-01 ENCOUNTER — Other Ambulatory Visit: Payer: Self-pay | Admitting: Licensed Clinical Social Worker

## 2022-08-01 DIAGNOSIS — Z139 Encounter for screening, unspecified: Secondary | ICD-10-CM

## 2022-08-01 NOTE — Patient Instructions (Signed)
   Goals Addressed             This Visit's Progress    COMPLETED: connect with community resources and diabetes education       Activities and task to complete in order to accomplish goals.   Keep all upcoming appointment discussed today Continue with compliance of taking medication prescribed by Doctor I have contacted the oncology social work team, they will assist you with transportation to your oncology appointment next week I have also placed a referral ( ref [id number]) to assist you with transportation to your cardiology appointment  I have scheduled a phone appointment with the RN Care Manager she will provide educational informational and assist you with managing your diabetes You also have a phone appointment with Enrique Sack, she will assist you with barriers related to ongoing transportation needs, food and advise how to f/u on your Medicaid         Patient was not contacted during this encounter.  LCSW collaborated with care team to accomplish patient's care plan goal     Sammuel Hines, [employer] Social Work Care Coordination  [facility] [employer] [phone]

## 2022-08-01 NOTE — Patient Outreach (Signed)
  Care Coordination  Collaboration  Note   08/01/2022 Name: Felicia [name] MRN: [id number] DOB: 06-09-1950  Felicia [name] is a 72 y.o. year old female who sees Allwardt, Crist Infante, PA-C for primary care. I  did not engage with patient during this encounter.  What matters to the patients health and wellness today?   Transportation to medical appointments   Patient was not interviewed or contacted during this encounter Conducted brief assessment, recommendations and relevant information discussed.   LCSW  collaborated with Child psychotherapist at the [facility]  to assist with meeting patient's needs.  .  Per [facility] Rasmussen, [facility] will assist patient with transportation to her appointment next week Referral placed for Ref. [id number] to assist with transportation.  Heart Failure social worker will f/u and assist if needed.    Goals Addressed             This Visit's Progress    COMPLETED: connect with community resources and diabetes education       Activities and task to complete in order to accomplish goals.   Keep all upcoming appointment discussed today Continue with compliance of taking medication prescribed by Doctor I have contacted the oncology social work team, they will assist you with transportation to your oncology appointment next week I have also placed a referral ( ref [id number]) to assist you with transportation to your cardiology appointment  I have scheduled a phone appointment with the RN Care Manager she will provide educational informational and assist you with managing your diabetes You also have a phone appointment with Enrique Sack, she will assist you with barriers related to ongoing transportation needs, food and advise how to f/u on your Medicaid        SDOH assessments and interventions completed:  No   Care Coordination Interventions:  Yes, provided  Interventions Today    Flowsheet Row Most Recent Value  Chronic Disease   Chronic disease  during today's visit Diabetes  General Interventions   General Interventions Discussed/Reviewed General Interventions Reviewed, Communication with, Tour manager with Social Work  [facility]]       Follow up plan: Follow up call scheduled for July 25th with BSW for ongoing support for SDOH    Encounter Outcome:  Pt. Visit Completed   Sammuel Hines, LCSW Social Work Care Coordination  [facility]ie [employer] [employer] [phone]

## 2022-08-02 ENCOUNTER — Ambulatory Visit: Payer: Self-pay

## 2022-08-02 ENCOUNTER — Telehealth: Payer: Self-pay | Admitting: *Deleted

## 2022-08-02 NOTE — Patient Instructions (Signed)
Visit Information  Thank you for taking time to visit with me today. Please don't hesitate to contact me if I can be of assistance to you.   Following are the goals we discussed today:   Goals Addressed             This Visit's Progress    Diabetes Management       Patient Goals/Self Care Activities: -Patient/Caregiver will self-administer medications as prescribed as evidenced by self-report/primary caregiver report  -Patient/Caregiver will attend all scheduled provider appointments as evidenced by clinician review of documented attendance to scheduled appointments and patient/caregiver report -Patient/Caregiver will call provider office for new concerns or questions as evidenced by review of documented incoming telephone call notes and patient report  -check blood sugar at prescribed times -check blood sugar if I feel it is too high or too low -record values and write them down take them to all doctor visits    Patient reports she is doing okay.  Trying to get sugars back under control.  She is increasing her lantus dosing as prescribed by PCP. Currently at 28 units but blood sugar 170 this am.  Dose to be titrated until she can get her sugars at 140.  Discussed diabetes and diet control.          Our next appointment is by telephone on 09/02/22 at 1200 pm  Please call the care guide team at [phone] if you need to cancel or reschedule your appointment.   If you are experiencing a Mental Health or Behavioral Health Crisis or need someone to talk to, please call the Suicide and Crisis Lifeline: 988   Patient verbalizes understanding of instructions and care plan provided today and agrees to view in MyChart. Active MyChart status and patient understanding of how to access instructions and care plan via MyChart confirmed with patient.     The patient has been provided with contact information for the care management team and has been advised to call with any health related  questions or concerns.   Bary Leriche, RN, MSN [facility] Care Management Care Management Coordinator Direct Line [phone]

## 2022-08-02 NOTE — Telephone Encounter (Signed)
   Telephone encounter was:  Unsuccessful.  08/02/2022 Name: Felicia [name] MRN: [id number] DOB: Jun 05, 1950  Unsuccessful outbound call made today to assist with:  Transportation Needs   Outreach Attempt:  1st Attempt  A HIPAA compliant voice message was left requesting a return call.  Instructed patient to call back at [phone].  Yehuda Mao Greenauer -[place] [facility] [place], Population Health [phone] [address] [place] , [place] [address] Email : [email or web address]

## 2022-08-02 NOTE — Patient Outreach (Signed)
  Care Coordination   Initial Visit Note   08/02/2022 Name: Felicia [name] MRN: [id number] DOB: 1950-10-10  Felicia [name] is a 72 y.o. year old female who sees Allwardt, Crist Infante, PA-C for primary care. I spoke with  Felicia [name] by phone today.  What matters to the patients health and wellness today?  Diabetes Management    Goals Addressed             This Visit's Progress    Diabetes Management       Patient Goals/Self Care Activities: -Patient/Caregiver will self-administer medications as prescribed as evidenced by self-report/primary caregiver report  -Patient/Caregiver will attend all scheduled provider appointments as evidenced by clinician review of documented attendance to scheduled appointments and patient/caregiver report -Patient/Caregiver will call provider office for new concerns or questions as evidenced by review of documented incoming telephone call notes and patient report  -check blood sugar at prescribed times -check blood sugar if I feel it is too high or too low -record values and write them down take them to all doctor visits    Patient reports she is doing okay.  Trying to get sugars back under control.  She is increasing her lantus dosing as prescribed by PCP. Currently at 28 units but blood sugar 170 this am.  Dose to be titrated until she can get her sugars at 140.  Discussed diabetes and diet control.          SDOH assessments and interventions completed:  Yes  SDOH Interventions Today    Flowsheet Row Most Recent Value  SDOH Interventions   Housing Interventions Intervention Not Indicated        Care Coordination Interventions:  Yes, provided   Follow up plan: Follow up call scheduled for August    Encounter Outcome:  Pt. Visit Completed   Bary Leriche, RN, MSN [facility] Care Management Care Management Coordinator Direct Line [phone]

## 2022-08-04 NOTE — Progress Notes (Signed)
Patient Care Team: Allwardt, Crist Infante, PA-C as PCP - General (Physician Assistant) Nahser, Deloris Ping, MD as PCP - Cardiology (Cardiology) Emelia Loron, MD as Consulting Physician (General Surgery) Lonie Peak, MD as Attending Physician (Radiation Oncology) Zenovia Jordan, MD as Consulting Physician (Rheumatology) Betha Loa, MD as Consulting Physician (Orthopedic Surgery) Marlene Lard, MD as Consulting Physician (Endocrinology) Luciano Cutter, MD as Consulting Physician (Pulmonary Disease) Serena Croissant, MD as Consulting Physician (Hematology and Oncology) Erroll Luna, [facility] (Inactive) as Pharmacist (Pharmacist)  DIAGNOSIS: No diagnosis found.  SUMMARY OF ONCOLOGIC HISTORY: Oncology History Overview Note   Cancer Staging  Malignant neoplasm of upper-outer quadrant of left breast in female, estrogen receptor positive (HCC) Staging form: Breast, AJCC 8th Edition - Clinical stage from 09/08/2019: Stage IA (cT1c, cN0, cM0, G2, ER+, PR+, HER2+) - Unsigned - Pathologic stage from 10/07/2019: Stage IA (pT1c, pN1, cM0, G2, ER+, PR+, HER2+) - Signed by Loa Socks, NP on 01/09/2021    Malignant neoplasm of upper-outer quadrant of left breast in female, estrogen receptor positive (HCC)  08/30/2019 Initial Biopsy   Diagnosis Breast, left, needle core biopsy, 2 o'clock, 10cmfn - INVASIVE MAMMARY CARCINOMA. Microscopic Comment The carcinoma is nuclear grade 2. Micropapillary features are noted. The greatest linear extent of tumor in any one core is 6 mm. ADDENDUM: Immunohistochemistry for E-cadherin is positive supporting a ductal phenotype.  PROGNOSTIC INDICATORS Results: The tumor cells are EQUIVOCAL for Her2 (2+). Her2 by FISH will be performed and results reported separately. Estrogen Receptor: 100%, POSITIVE, STRONG STAINING INTENSITY Progesterone Receptor: 40%, POSITIVE, STRONG STAINING INTENSITY Proliferation Marker Ki67: 20%  FLUORESCENCE IN-SITU  HYBRIDIZATION Results: GROUP 1: HER2 **POSITIVE**   09/02/2019 Initial Diagnosis   Malignant neoplasm of upper-outer quadrant of left breast in female, estrogen receptor positive (HCC)   09/30/2019 - 09/30/2019 Chemotherapy   The patient had trastuzumab-dkst (OGIVRI) 630 mg in sodium chloride 0.9 % 250 mL chemo infusion, 8 mg/kg, Intravenous,  Once, 0 of 5 cycles  for chemotherapy treatment.    10/07/2019 Definitive Surgery   FINAL MICROSCOPIC DIAGNOSIS:   A. BREAST, LEFT, LUMPECTOMY:  -  Invasive ductal carcinoma, Nottingham grade 2 of 3, 1.4 cm  -  Ductal carcinoma in-situ, high grade  -  Margins uninvolved by carcinoma (see parts C, D and E)  -  Previous biopsy site changes present  -  See oncology table and comment below   B. LYMPH NODE, LEFT AXILLARY, SENTINEL, EXCISION:  -  Metastatic carcinoma involving one lymph node (1/1), 0.3 cm  -  Focal extracapsular extension   C. BREAST, LEFT, ADDITIONAL MEDIAL MARGIN, EXCISION:  -  Usual ductal hyperplasia  -  No residual carcinoma identified  -  See comment   D. BREAST, LEFT, ADDITIONAL INFERIOR MARGIN, EXCISION:  -  No residual carcinoma identified   E. BREAST, LEFT, ADDITIONAL POSTERIOR MARGIN, EXCISION:  -  No residual carcinoma identified   COMMENT:   A.  The carcinoma has micropapillary features.   C.  Cytokeratin 5/6 and E-cadherin are positive in the usual ductal  hyperplasia.    11/24/2019 - 01/28/2020 Chemotherapy   adjuvant chemotherapy consisting of carboplatin, gemcitabine, trastuzumab given every 3 weeks x4  -discontinued after 4 cycles with poor tolerance.        02/16/2020 - 11/16/2020 Chemotherapy   Patient is on Treatment Plan : BREAST ADO-Trastuzumab Emtansine (Kadcyla) q21d      03/30/2020 - 05/12/2020 Radiation Therapy   Site Technique Total Dose (Gy) Dose per Fx (Gy)  Completed Fx Beam Energies  Breast, Left: Breast_Lt 3D 50/50 2 25/25 10X  Breast, Left: Breast_Lt_SCV_PAB 3D 50/50 2 25/25 10X, 15X   Breast, Left: Breast_Lt_Bst 3D 10/10 2 5/5 6X, 10X     04/11/2020 Imaging   EXAM: MRI HEAD WITHOUT AND WITH CONTRAST  IMPRESSION: 1. No evidence of intracranial metastatic disease. 2. Mild chronic small vessel ischemia.   11/16/2020 -  Anti-estrogen oral therapy   Anastrozole, 1 mg daily     CHIEF COMPLIANT:  Follow-up on anastrozole   INTERVAL HISTORY: Felicia [name] is a 72 yo with the above mentioned on anastrozole. She presents to the clinic today for a follow-up.   ALLERGIES:  is allergic to chloraprep one step [chlorhexidine gluconate], estrogens, and shrimp [shellfish allergy].  MEDICATIONS:  Current Outpatient Medications  Medication Sig Dispense Refill   ACCU-CHEK GUIDE test strip USE TO check blood glucose UP TO four times daily AS DIRECTED 100 strip 0   Accu-Chek Softclix Lancets lancets USE TO check blood glucose UP TO four times daily AS DIRECTED 100 each 0   albuterol (VENTOLIN HFA) 108 (90 Base) MCG/ACT inhaler Inhale 2 puffs into the lungs every 6 (six) hours as needed for wheezing or shortness of breath. 8 g 2   ALLERGY RELIEF 10 MG tablet TAKE ONE TABLET BY MOUTH EVERY EVENING 30 tablet 11   anastrozole (ARIMIDEX) 1 MG tablet TAKE ONE TABLET BY MOUTH EVERYDAY AT BEDTIME 90 tablet 4   Ascorbic Acid (VITAMIN C PO) Take 1 tablet by mouth daily.     aspirin EC 81 MG tablet Take 81 mg by mouth daily.     atorvastatin (LIPITOR) 40 MG tablet Take 1 tablet (40 mg total) by mouth at bedtime. Please keep scheduled appointment for future refills. Thank you. 30 tablet 1   bisoprolol (ZEBETA) 5 MG tablet Take 1 tablet (5 mg total) by mouth at bedtime. Please keep scheduled appointment for future refills. Thank you. 30 tablet 1   blood glucose meter kit and supplies Dispense based on patient and insurance preference. Use up to four times daily as directed. (FOR ICD-10 E10.9, E11.9). 1 each 0   Calcium Citrate-Vitamin D (CALCIUM + D PO) Take 1 tablet by mouth daily with supper.  (Patient not taking: Reported on 08/02/2022)     celecoxib (CELEBREX) 200 MG capsule Take 1 capsule (200 mg total) by mouth 2 (two) times daily. 180 capsule 1   CINNAMON PO Take 1 capsule by mouth daily with supper.     Continuous Glucose Receiver (FREESTYLE LIBRE 14 DAY READER) DEVI Apply 1 each topically continuous. Apply reader for monitoring of continuous glucose monitoring. 1 each 11   Continuous Glucose Sensor (FREESTYLE LIBRE 14 DAY SENSOR) MISC APPLY EVERY 14 DAYS TO CHECK BLOOD SUGAR 1 each 11   diclofenac sodium (VOLTAREN) 1 % GEL Apply 2-4 g topically 4 (four) times daily as needed (as directed for pain).     escitalopram (LEXAPRO) 20 MG tablet TAKE ONE TABLET BY MOUTH EVERYDAY AT BEDTIME 90 tablet 1   fenofibrate (TRICOR) 145 MG tablet Take 1 tablet (145 mg total) by mouth at bedtime. Please keep scheduled appointment for future refills. Thank you. 30 tablet 1   Ferrous Sulfate (IRON PO) Take 1 tablet by mouth daily. alternates days:1 tablet one day and 2 tablets the next day     gabapentin (NEURONTIN) 300 MG capsule Take 300mg  every afternoon. 30 capsule 5   gabapentin (NEURONTIN) 600 MG tablet Take 1 tablet (600 mg  total) by mouth 2 (two) times daily. 60 tablet 5   glipiZIDE (GLUCOTROL XL) 10 MG 24 hr tablet TAKE TWO TABLETS BY MOUTH EVERY MORNING 90 tablet 1   Incontinence Supply Disposable (DEPEND ADJUSTABLE UNDERWEAR) MISC 1 Application by Does not apply route daily. 16 each 11   insulin glargine (LANTUS SOLOSTAR) 100 UNIT/ML Solostar Pen Start at 4 units in the evenings. Increase by 2 units every 2 days until morning fasting glucose is to goal (140). 15 mL 11   Insulin Pen Needle (PEN NEEDLES) 32G X 4 MM MISC USE TO INJECT INSULIN 100 each 3   JARDIANCE 25 MG TABS tablet TAKE ONE TABLET BY MOUTH BEFORE BREAKFAST 90 tablet 1   levothyroxine (SYNTHROID) 125 MCG tablet TAKE ONE TABLET BY MOUTH BEFORE BREAKFAST 30 tablet 2   metFORMIN (GLUCOPHAGE) 500 MG tablet TAKE TWO TABLETS BY MOUTH  TWICE DAILY 270 tablet 1   metroNIDAZOLE (METROGEL) 1 % gel Apply 1 application topically as needed (as directed to affected area). 45 g 2   Multiple Vitamins-Calcium (ONE-A-DAY WOMENS PO) Take 1 tablet by mouth daily.     nitroGLYCERIN (NITROSTAT) 0.4 MG SL tablet Place 1 tablet (0.4 mg total) under the tongue every 5 (five) minutes as needed for chest pain. 25 tablet 6   omeprazole (PRILOSEC) 40 MG capsule TAKE ONE CAPSULE BY MOUTH EVERY EVENING 90 capsule 1   rOPINIRole (REQUIP) 0.5 MG tablet TAKE ONE TABLET BY MOUTH EVERYDAY AT BEDTIME 90 tablet 1   Tiotropium Bromide-Olodaterol (STIOLTO RESPIMAT) 2.5-2.5 MCG/ACT AERS Inhale 2 puffs into the lungs daily. 4 g 5   tiZANidine (ZANAFLEX) 2 MG tablet Take 1-2 tablets (2-4 mg total) by mouth at bedtime as needed. 60 tablet 0   traMADol (ULTRAM) 50 MG tablet TAKE ONE TABLET BY MOUTH THREE TIMES daily AS NEEDED FOR moderate OR SEVERE pain 90 tablet 2   triamcinolone cream (KENALOG) 0.1 % Apply 1 Application topically 2 (two) times daily. For 14 days maximum 80 g 0   TURMERIC PO Take 1 capsule by mouth daily with supper.     No current facility-administered medications for this visit.    PHYSICAL EXAMINATION: ECOG PERFORMANCE STATUS: {CHL ONC ECOG PS:[phone]}  There were no vitals filed for this visit. There were no vitals filed for this visit.  BREAST:*** No palpable masses or nodules in either right or left breasts. No palpable axillary supraclavicular or infraclavicular adenopathy no breast tenderness or nipple discharge. (exam performed in the presence of a chaperone)  LABORATORY DATA:  I have reviewed the data as listed    Latest Ref Rng & Units 06/06/2022    2:08 PM 03/26/2022   11:35 AM 11/27/2021   12:00 AM  CMP  Glucose 70 - 99 mg/dL 413  244    BUN 6 - 23 mg/dL 15  22  18       Creatinine 0.40 - 1.20 mg/dL 0.10  2.72  0.7      Sodium 135 - 145 mEq/L 140  140  139      Potassium 3.5 - 5.1 mEq/L 4.2  4.3  4.6      Chloride 96 -  112 mEq/L 103  103  102      CO2 19 - 32 mEq/L 26  24  21       Calcium 8.6 - 10.4 mg/dL 8.4 - 53.6 mg/dL 64.4    03.4  74.2  59.5      Total Protein 6.0 - 8.3 g/dL 7.4  7.4    Total Bilirubin 0.2 - 1.2 mg/dL 0.5  0.4    Alkaline Phos 39 - 117 U/L 49  51  50      AST 0 - 37 U/L 21  21    ALT 0 - 35 U/L 23  26       This result is from an external source.   Multiple values from one day are sorted in reverse-chronological order    Lab Results  Component Value Date   WBC 13.4 (H) 06/06/2022   HGB 14.7 06/06/2022   HCT 45.7 06/06/2022   MCV 94.4 06/06/2022   PLT 285.0 06/06/2022   NEUTROABS 4.7 06/06/2022    ASSESSMENT & PLAN:  No problem-specific Assessment & Plan notes found for this encounter.    No orders of the defined types were placed in this encounter.  The patient has a good understanding of the overall plan. she agrees with it. she will call with any problems that may develop before the next visit here. Total time spent: 30 mins including face to face time and time spent for planning, charting and co-ordination of care   Sherlyn Lick, CMA 08/04/22    I Janan Ridge am acting as a Neurosurgeon for [employer]  ***

## 2022-08-05 ENCOUNTER — Other Ambulatory Visit: Payer: Self-pay

## 2022-08-05 ENCOUNTER — Telehealth: Payer: Self-pay | Admitting: *Deleted

## 2022-08-05 ENCOUNTER — Ambulatory Visit: Payer: Medicare Other | Admitting: Hematology and Oncology

## 2022-08-05 ENCOUNTER — Inpatient Hospital Stay: Payer: Medicare HMO

## 2022-08-05 ENCOUNTER — Inpatient Hospital Stay: Payer: Medicare HMO | Attending: Hematology and Oncology | Admitting: Hematology and Oncology

## 2022-08-05 VITALS — BP 137/69 | HR 75 | Temp 97.5°F | Resp 18 | Ht 64.0 in | Wt 177.8 lb

## 2022-08-05 DIAGNOSIS — C541 Malignant neoplasm of endometrium: Secondary | ICD-10-CM | POA: Insufficient documentation

## 2022-08-05 DIAGNOSIS — Z170 Estrogen receptor positive status [ER+]: Secondary | ICD-10-CM | POA: Diagnosis not present

## 2022-08-05 DIAGNOSIS — C50412 Malignant neoplasm of upper-outer quadrant of left female breast: Secondary | ICD-10-CM | POA: Insufficient documentation

## 2022-08-05 DIAGNOSIS — M898X9 Other specified disorders of bone, unspecified site: Secondary | ICD-10-CM | POA: Insufficient documentation

## 2022-08-05 DIAGNOSIS — Z90722 Acquired absence of ovaries, bilateral: Secondary | ICD-10-CM | POA: Insufficient documentation

## 2022-08-05 DIAGNOSIS — I6782 Cerebral ischemia: Secondary | ICD-10-CM | POA: Diagnosis not present

## 2022-08-05 DIAGNOSIS — Z79899 Other long term (current) drug therapy: Secondary | ICD-10-CM | POA: Insufficient documentation

## 2022-08-05 DIAGNOSIS — E119 Type 2 diabetes mellitus without complications: Secondary | ICD-10-CM | POA: Insufficient documentation

## 2022-08-05 DIAGNOSIS — Z79811 Long term (current) use of aromatase inhibitors: Secondary | ICD-10-CM | POA: Insufficient documentation

## 2022-08-05 DIAGNOSIS — M79606 Pain in leg, unspecified: Secondary | ICD-10-CM | POA: Insufficient documentation

## 2022-08-05 NOTE — Assessment & Plan Note (Addendum)
08/30/2019: Clinical T1CN0 stage Ia grade 2 IDC ER/PR positive HER2 positive Ki-67 20% 2014: TAH/BSO for grade 3 stage I endometrial cancer status post vaginal brachytherapy 10/07/2019: Left lumpectomy: T1CN1 stage Ib grade 2 IDC with negative margins 11/24/2019-01/26/2020: Adjuvant chemo with carboplatin, gemcitabine, trastuzumab every 3 weeks x 6 02/16/2020-November 2022: TDM 1 03/30/2020-05/12/2020: Adjuvant radiation   Current treatment: Anastrozole started 11/16/2020 Anastrozole toxicities: Tolerating it fairly well without any major side effects but denies any hot flashes.   Muscle aches and pains and bone pain:  CT CAP 08/09/2021: No evidence of metastatic disease Bone scan 08/11/2021: Nonspecific uptake left humeral head recommended an x-ray of the region.      Diabetes: She has lost significant amount of weight over the past several years.    Breast cancer surveillance: mammograms 08/22/2021: Benign Bone density 03/19/2021: T score -0.7: Normal Breast exam 08/05/2022: Benign   Hypercalcemia: Stopped calcium supplementation. We will obtain a bone scan for further evaluation. Telephone visit after that to discuss results.  If these results are normal then we can see her in 1 year.

## 2022-08-05 NOTE — Progress Notes (Unsigned)
Cardiology Office Note:    Date:  08/06/2022   ID:  Felicia [name], DOB Nov 03, 1950, MRN [id number]  PCP:  Bary Leriche, PA-C  Cardiologist:  Kristeen Miss, MD    Referring MD: Bary Leriche, PA-C   Chief Complaint  Patient presents with   Coronary Artery Disease   Problem list 1.  Coronary artery disease - March, 2017,  Resolute Integrity 2.25 x 18 mm stent - mid LAD  2.  Diabetes mellitus 3.  Hypothyroidism 4.  Hyperlipidemia     Felicia [name] is a 72 y.o. female with a hx of coronary artery disease, diabetes mellitus, and hypothyroidism.  She recently moved from [place].  She is here to establish cardiology care.  Has had some unusual chest pressure .   Not exactily like her previous discomfort prior stenting . Had a stress echo which suggested  CAD.    Has chest pressure -   Band like sensation,  Does not exercise She does not think it worsens with exertion. ,  Does not radiate, not associated with increased dyspnea.  Not associated with presyncope .  The sensation comes and goes.   The pain has been present for the past 3 months .   Is on Bisoprilol 5 mg a day  Tried Metoprolol but this caused lots of fatigue and sleepyness.   May 06, 2017: Our were seen today for follow-up of her coronary artery disease.  She is status post stenting of her mid LAD March 2017. Is a history of hyperlipidemia. No angina, Exercising in the pool regularly  Has lots of arthritis .  Suggested a stationary bike or elliptical bike   Sept. 2020  Has been working - [employer] in [employer]  Not much cardiiac exercise  No CP or dyspnea  We discussed her elevated Triglyceride levels and discussed Vascepa - she does not think she could afford it   May 07, 2019:  Felicia [name] is seen today for follow-up of her coronary artery disease.  She is status post stenting of her mid LAD in March, 2017.  She has a history of hyperlipidemia. A little bit of exercise.   Does PT  Still  working - the [employer] at [employer]   Needs to have right trigger finger surgery  Felicia Loa, MD)  She is at low risk for this finger surgery  She may hold her ASA for 5-7 days prior to surgery if needed for her surgery   Labs were drawn 2 days ago  Goal LDL is < 70  Her trigs have been elevated.   Goal trig level is 150   Sept. 13,  2021  Felicia [name] is seen today for follow up . Hx of CAD Had finger surgery with Dr. Merlyn Lot since I last saw her  Is doing well  No CP or dyspnea  Has had some palpitations , last for a few seconds.  Found out recently that she had left breast cancer ( found on mamogram )  She will likely be following up with Dr. Gala Romney for the next year   May 04, 2021:  Felicia [name] is seen back today for follow-up of her palpitations. Has had more complications with her COPD  Has CP related to her COPD  CP with a deep breath  Was hospitalized with the cp , Cath in April 2022 revealed a patent stent .   July 23,2024 Felicia [name] is seen for follow up of her CAD, palpitations COPD  Has some  left chest ache She thinks it may be her left breast cancer  Walks on occasion, No CP with walking  Has leg weakness ,  walks with her rollator .    Past Medical History:  Diagnosis Date   Anemia    Anxiety    Breast cancer (HCC) 09/2019   left breast IMC   CAD (coronary artery disease)    a. 3/2007s/p DES to the LAD [facility]); b. 01/2017 MV: EF 80%, small, mild apical ant defect w/o ischemia (felt to be breast atten). Low risk.   Depression    DM (diabetes mellitus) (HCC)    Essential hypertension    GERD (gastroesophageal reflux disease)    Headache    secondary to neck surgery per patient   High triglycerides    Hyperlipidemia    Hypothyroid    OA (osteoarthritis)    right knee,hands   Overflow incontinence    PONV (postoperative nausea and vomiting)    s/p gallbladder    RLS (restless legs syndrome)    Urinary urgency    Uterine cancer (HCC) dx'd 2014     Past Surgical History:  Procedure Laterality Date   BREAST LUMPECTOMY WITH RADIOACTIVE SEED AND SENTINEL LYMPH NODE BIOPSY Left 10/07/2019   Procedure: LEFT BREAST LUMPECTOMY WITH RADIOACTIVE SEED AND SENTINEL LYMPH NODE BIOPSY;  Surgeon: Emelia Loron, MD;  Location: [facility];  Service: General;  Laterality: Left;   CORONARY STENT PLACEMENT     CORONARY/GRAFT ACUTE MI REVASCULARIZATION     FINGER ARTHRODESIS Right 06/11/2019   Procedure: ARTHRODESIS INDEX FINGER DISTAL PHALANGEAL JOINT;  Surgeon: Felicia Loa, MD;  Location: [facility];  Service: Orthopedics;  Laterality: Right;   FOOT SURGERY Left    LEFT HEART CATH AND CORONARY ANGIOGRAPHY N/A 02/16/2018   Procedure: LEFT HEART CATH AND CORONARY ANGIOGRAPHY;  Surgeon: Runell Gess, MD;  [facility];  Service: Cardiovascular;  Laterality: N/A;   LEFT HEART CATH AND CORONARY ANGIOGRAPHY N/A 05/01/2020   Procedure: LEFT HEART CATH AND CORONARY ANGIOGRAPHY;  Surgeon: Runell Gess, MD;  Location: [facility];  Service: Cardiovascular;  Laterality: N/A;   PORTACATH PLACEMENT Right 11/23/2019   Procedure: INSERTION PORT-A-CATH WITH ULTRASOUND GUIDANCE;  Surgeon: Emelia Loron, MD;  Location: [facility];  Service: General;  Laterality: Right;   REPLACEMENT TOTAL KNEE Right    SPINE SURGERY     TOTAL ABDOMINAL HYSTERECTOMY     FOR UTERUS CANCER   TOTAL HIP ARTHROPLASTY Left 2015    Current Medications: Current Meds  Medication Sig   ACCU-CHEK GUIDE test strip USE TO check blood glucose UP TO four times daily AS DIRECTED   Accu-Chek Softclix Lancets lancets USE TO check blood glucose UP TO four times daily AS DIRECTED   albuterol (VENTOLIN HFA) 108 (90 Base) MCG/ACT inhaler Inhale 2 puffs into the lungs every 6 (six) hours as needed for wheezing or shortness of breath.   ALLERGY RELIEF 10 MG tablet TAKE ONE TABLET BY MOUTH EVERY EVENING    anastrozole (ARIMIDEX) 1 MG tablet TAKE ONE TABLET BY MOUTH EVERYDAY AT BEDTIME   Ascorbic Acid (VITAMIN C PO) Take 1 tablet by mouth daily.   aspirin EC 81 MG tablet Take 81 mg by mouth daily.   atorvastatin (LIPITOR) 40 MG tablet Take 1 tablet (40 mg total) by mouth at bedtime. Please keep scheduled appointment for future refills. Thank you.   bisoprolol (ZEBETA) 5 MG tablet Take 1  tablet (5 mg total) by mouth at bedtime. Please keep scheduled appointment for future refills. Thank you.   blood glucose meter kit and supplies Dispense based on patient and insurance preference. Use up to four times daily as directed. (FOR ICD-10 E10.9, E11.9).   Calcium Citrate-Vitamin D (CALCIUM + D PO) Take 1 tablet by mouth daily with supper.   celecoxib (CELEBREX) 200 MG capsule Take 1 capsule (200 mg total) by mouth 2 (two) times daily.   CINNAMON PO Take 1 capsule by mouth daily with supper.   Continuous Glucose Receiver (FREESTYLE LIBRE 14 DAY READER) DEVI Apply 1 each topically continuous. Apply reader for monitoring of continuous glucose monitoring.   Continuous Glucose Sensor (FREESTYLE LIBRE 14 DAY SENSOR) MISC APPLY EVERY 14 DAYS TO CHECK BLOOD SUGAR   diclofenac sodium (VOLTAREN) 1 % GEL Apply 2-4 g topically 4 (four) times daily as needed (as directed for pain).   escitalopram (LEXAPRO) 20 MG tablet TAKE ONE TABLET BY MOUTH EVERYDAY AT BEDTIME   fenofibrate (TRICOR) 145 MG tablet Take 1 tablet (145 mg total) by mouth at bedtime. Please keep scheduled appointment for future refills. Thank you.   Ferrous Sulfate (IRON PO) Take 1 tablet by mouth daily. alternates days:1 tablet one day and 2 tablets the next day   gabapentin (NEURONTIN) 300 MG capsule Take 300mg  every afternoon.   gabapentin (NEURONTIN) 600 MG tablet Take 1 tablet (600 mg total) by mouth 2 (two) times daily.   glipiZIDE (GLUCOTROL XL) 10 MG 24 hr tablet TAKE TWO TABLETS BY MOUTH EVERY MORNING   Incontinence Supply Disposable (DEPEND  ADJUSTABLE UNDERWEAR) MISC 1 Application by Does not apply route daily.   insulin glargine (LANTUS SOLOSTAR) 100 UNIT/ML Solostar Pen Start at 4 units in the evenings. Increase by 2 units every 2 days until morning fasting glucose is to goal (140).   Insulin Pen Needle (PEN NEEDLES) 32G X 4 MM MISC USE TO INJECT INSULIN   JARDIANCE 25 MG TABS tablet TAKE ONE TABLET BY MOUTH BEFORE BREAKFAST   levothyroxine (SYNTHROID) 125 MCG tablet TAKE ONE TABLET BY MOUTH BEFORE BREAKFAST   metFORMIN (GLUCOPHAGE) 500 MG tablet TAKE TWO TABLETS BY MOUTH TWICE DAILY   metroNIDAZOLE (METROGEL) 1 % gel Apply 1 application topically as needed (as directed to affected area).   Multiple Vitamins-Calcium (ONE-A-DAY WOMENS PO) Take 1 tablet by mouth daily.   nitroGLYCERIN (NITROSTAT) 0.4 MG SL tablet Place 1 tablet (0.4 mg total) under the tongue every 5 (five) minutes as needed for chest pain.   omeprazole (PRILOSEC) 40 MG capsule TAKE ONE CAPSULE BY MOUTH EVERY EVENING   rOPINIRole (REQUIP) 0.5 MG tablet TAKE ONE TABLET BY MOUTH EVERYDAY AT BEDTIME   Tiotropium Bromide-Olodaterol (STIOLTO RESPIMAT) 2.5-2.5 MCG/ACT AERS Inhale 2 puffs into the lungs daily.   tiZANidine (ZANAFLEX) 2 MG tablet Take 1-2 tablets (2-4 mg total) by mouth at bedtime as needed.   traMADol (ULTRAM) 50 MG tablet TAKE ONE TABLET BY MOUTH THREE TIMES daily AS NEEDED FOR moderate OR SEVERE pain   triamcinolone cream (KENALOG) 0.1 % Apply 1 Application topically 2 (two) times daily. For 14 days maximum   TURMERIC PO Take 1 capsule by mouth daily with supper.     Allergies:   Chloraprep one step [chlorhexidine gluconate], Estrogens, and Shrimp [shellfish allergy]   Social History   Socioeconomic History   Marital status: Divorced    Spouse name: Not on file   Number of children: Not on file   Years of  education: Not on file   Highest education level: Not on file  Occupational History   Not on file  Tobacco Use   Smoking status: Never    Smokeless tobacco: Never  Vaping Use   Vaping status: Never Used  Substance and Sexual Activity   Alcohol use: Yes    Comment: OCCASIONALLY   Drug use: No   Sexual activity: Not Currently    Birth control/protection: Surgical    Comment: hyst  Other Topics Concern   Not on file  Social History Narrative   Originally from [place], moved here in 2018.    Right handed   Drinks decaf   One floor house   Social Determinants of Health   Financial Resource Strain: Low Risk  (07/31/2022)   Overall Financial Resource Strain (CARDIA)    Difficulty of Paying Living Expenses: Not very hard  Food Insecurity: Food Insecurity Present (07/31/2022)   Hunger Vital Sign    Worried About Running Out of Food in the Last Year: Sometimes true    Ran Out of Food in the Last Year: Never true  Transportation Needs: Unmet Transportation Needs (07/31/2022)   PRAPARE - Administrator, Civil Service (Medical): Yes    Lack of Transportation (Non-Medical): Yes  Physical Activity: Inactive (04/11/2022)   Exercise Vital Sign    Days of Exercise per Week: 0 days    Minutes of Exercise per Session: 0 min  Stress: No Stress Concern Present (07/31/2022)   [employer] of Occupational Health - Occupational Stress Questionnaire    Feeling of Stress : Only a little  Social Connections: Socially Isolated (04/11/2022)   Social Connection and Isolation Panel [NHANES]    Frequency of Communication with Friends and Family: Once a week    Frequency of Social Gatherings with Friends and Family: Three times a week    Attends Religious Services: Never    Active Member of Clubs or Organizations: No    Attends Banker Meetings: Never    Marital Status: Divorced     Family History: The patient's family history includes AAA (abdominal aortic aneurysm) in her mother; Alzheimer's disease in her father; Breast cancer in her cousin; Diabetes in her father; Throat cancer in her sister. ROS:    Please see the history of present illness.     All other systems reviewed and are negative.   Physical Exam: Blood pressure 108/60, pulse (!) 58, height 5\' 4"  (1.626 m), weight 176 lb (79.8 kg), SpO2 98%.       GEN:  Well nourished, well developed in no acute distress HEENT: Normal NECK: No JVD; No carotid bruits LYMPHATICS: No lymphadenopathy CARDIAC: RRR , no murmurs, rubs, gallops RESPIRATORY:  Clear to auscultation without rales, wheezing or rhonchi  ABDOMEN: Soft, non-tender, non-distended MUSCULOSKELETAL:  No edema; No deformity ,  distal foot pulses are 2+  SKIN: Warm and dry NEUROLOGIC:  Alert and oriented x 3    ECG :  EKG Interpretation Date/Time:  Tuesday August 06 2022 11:31:02 EDT Ventricular Rate:  58 PR Interval:  164 QRS Duration:  90 QT Interval:  444 QTC Calculation: 435 R Axis:   -36  Text Interpretation: Sinus bradycardia Left axis deviation Moderate voltage criteria for LVH, may be normal variant ( R in aVL , Cornell product ) When compared with ECG of 27-Jun-2021 19:55, No significant change was found Confirmed by Kristeen Miss [phone]) on 08/06/2022 11:46:51 AM EKG Interpretation Date/Time:  Tuesday August 06 2022 11:31:02  EDT Ventricular Rate:  58 PR Interval:  164 QRS Duration:  90 QT Interval:  444 QTC Calculation: 435 R Axis:   -36  Text Interpretation: Sinus bradycardia Left axis deviation Moderate voltage criteria for LVH, may be normal variant ( R in aVL , Cornell product ) When compared with ECG of 27-Jun-2021 19:55, No significant change was found Confirmed by Kristeen Miss ([id number]) on 08/06/2022 11:46:51 AM      Recent Labs: 06/06/2022: ALT 23; BUN 15; Creatinine, Ser 0.80; Hemoglobin 14.7; Platelets 285.0; Potassium 4.2; Sodium 140; TSH 1.17  Recent Lipid Panel    Component Value Date/Time   CHOL 185 08/22/2021 1132   TRIG 350 (H) 08/22/2021 1132   HDL 52 08/22/2021 1132   CHOLHDL 3.6 08/22/2021 1132   LDLCALC 77 08/22/2021 1132       EKGs/Labs/Other Studies Reviewed:        ASSESSMENT:    1. Coronary artery disease involving native coronary artery of native heart without angina pectoris      PLAN:      1.   Coronary artery disease:    no angina .  Cont meds.  Encouraged her to walk more    2.  Hyperlipidemia:   Check labs today .  Her goal LDL is < 70  3.  Breast cancer:    Per oncology      Medication Adjustments/Labs and Tests Ordered: Current medicines are reviewed at length with the patient today.  Concerns regarding medicines are outlined above.  Orders Placed This Encounter  Procedures   EKG 12-Lead   No orders of the defined types were placed in this encounter.   Signed, Kristeen Miss, MD  08/06/2022 11:47 AM    [facility]

## 2022-08-05 NOTE — Telephone Encounter (Signed)
   Telephone encounter was:  Successful.  08/05/2022 Name: Legna Mausolf MRN: [id number] DOB: [birth year]  Rashada Klontz is a 72 y.o. year old female who is a primary care patient of Allwardt, Crist Infante, PA-C . The community resource team was consulted for assistance with Transportation Needs  Has used all [employer] benefit rides scheduled for upcoming appt and she signed waiver and was provided information on the use of the [facility] transportation , ride 7/23 cardiologist rollator or cane.. Flexible moves transportation  Care guide performed the following interventions: Patient provided with information about care guide support team and interviewed to confirm resource needs.  Follow Up Plan:  No further follow up planned at this time. The patient has been provided with needed resources.  Yehuda Mao Greenauer -[name], Population Health [phone] [address] [place] , [place] [address] Email : [email or web address]

## 2022-08-06 ENCOUNTER — Ambulatory Visit: Payer: Medicare HMO | Admitting: Cardiovascular Disease

## 2022-08-06 ENCOUNTER — Encounter: Payer: Self-pay | Admitting: Cardiovascular Disease

## 2022-08-06 VITALS — BP 108/60 | HR 58 | Ht 64.0 in | Wt 176.0 lb

## 2022-08-06 DIAGNOSIS — E785 Hyperlipidemia, unspecified: Secondary | ICD-10-CM | POA: Diagnosis not present

## 2022-08-06 DIAGNOSIS — Z79899 Other long term (current) drug therapy: Secondary | ICD-10-CM

## 2022-08-06 DIAGNOSIS — I2510 Atherosclerotic heart disease of native coronary artery without angina pectoris: Secondary | ICD-10-CM

## 2022-08-06 MED ORDER — NITROGLYCERIN 0.4 MG SL SUBL
SUBLINGUAL_TABLET | SUBLINGUAL | 6 refills | Status: DC
Start: 2022-08-06 — End: 2022-09-03

## 2022-08-06 NOTE — Patient Instructions (Signed)
Medication Instructions:  REFILLED Nitroglycerin *If you need a refill on your cardiac medications before your next appointment, please call your pharmacy*   Lab Work: Lipids, ALT, BMET today If you have labs (blood work) drawn today and your tests are completely normal, you will receive your results only by: MyChart Message (if you have MyChart) OR A paper copy in the mail If you have any lab test that is abnormal or we need to change your treatment, we will call you to review the results.   Testing/Procedures: NONE   Follow-Up: At [facility], you and your health needs are our priority.  As part of our continuing mission to provide you with exceptional heart care, we have created designated Provider Care Teams.  These Care Teams include your primary Cardiologist (physician) and Advanced Practice Providers (APPs -  Physician Assistants and Nurse Practitioners) who all work together to provide you with the care you need, when you need it.  We recommend signing up for the patient portal called "MyChart".  Sign up information is provided on this After Visit Summary.  MyChart is used to connect with patients for Virtual Visits (Telemedicine).  Patients are able to view lab/test results, encounter notes, upcoming appointments, etc.  Non-urgent messages can be sent to your provider as well.   To learn more about what you can do with MyChart, go to [email or web address].    Your next appointment:   1 year(s)  Provider:   Kristeen Miss, MD

## 2022-08-07 ENCOUNTER — Telehealth: Payer: Self-pay | Admitting: *Deleted

## 2022-08-07 LAB — ALT: ALT: 26 IU/L (ref 0–32)

## 2022-08-07 LAB — BASIC METABOLIC PANEL
BUN/Creatinine Ratio: 32 — ABNORMAL HIGH (ref 12–28)
BUN: 23 mg/dL (ref 8–27)
CO2: 25 mmol/L (ref 20–29)
Calcium: 10.4 mg/dL — ABNORMAL HIGH (ref 8.7–10.3)
Chloride: 100 mmol/L (ref 96–106)
Creatinine, Ser: 0.71 mg/dL (ref 0.57–1.00)
Glucose: 175 mg/dL — ABNORMAL HIGH (ref 70–99)
Potassium: 4.6 mmol/L (ref 3.5–5.2)
Sodium: 140 mmol/L (ref 134–144)
eGFR: 90 mL/min/{1.73_m2} (ref >59)

## 2022-08-07 LAB — LIPID PANEL
Chol/HDL Ratio: 4.7 ratio — ABNORMAL HIGH (ref 0.0–4.4)
Cholesterol, Total: 225 mg/dL — ABNORMAL HIGH (ref 100–199)
HDL: 48 mg/dL (ref >39)
LDL Chol Calc (NIH): 107 mg/dL — ABNORMAL HIGH (ref 0–99)
Triglycerides: 413 mg/dL — ABNORMAL HIGH (ref 0–149)
VLDL Cholesterol Cal: 70 mg/dL — ABNORMAL HIGH (ref 5–40)

## 2022-08-07 NOTE — Telephone Encounter (Signed)
Telephone encounter was:  Successful.  08/07/2022 Name: Felicia [name] MRN: [id number] DOB: Jul 30, 1950  Felicia [name] is a 72 y.o. year old female who is a primary care patient of Allwardt, Crist Infante, PA-C . The community resource team was consulted for assistance with Transportation Needs   Care guide performed the following iCalled to schedule a ride for next week : Patient provided with information about care guide support team and interviewed to confirm resource needs.  Felicia [name] -[name] Felicia [name] [place], Population Health [phone] [address] [place] , [place] [address] Email : [name] .Felicia [name] DOB: [birth year] MRN: [id number]   RIDER WAIVER AND RELEASE OF LIABILITY  For purposes of improving physical access to our facilities, [facility] is pleased to partner with third parties to provide [facility] patients or other authorized individuals the option of convenient, on-demand ground transportation services (the [employer]") through use of the technology service that enables users to request on-demand ground transportation from independent third-party providers.  By opting to use and accept these [employer], I, the undersigned, hereby agree on behalf of myself, and on behalf of any minor child using the Science writer for whom I am the parent or legal guardian, as follows:  Science writer provided to me are provided by independent third-party transportation providers who are not [employer] or employees and who are unaffiliated with [employer]. [facility] is neither a transportation carrier nor a common or public carrier. [facility] has no control over the quality or safety of the transportation that occurs as a result of the [employer]. [facility] cannot guarantee that any third-party transportation provider will complete any arranged transportation service. [facility] makes no  representation, warranty, or guarantee regarding the reliability, timeliness, quality, safety, suitability, or availability of any of the Transport Services or that they will be error free. I fully understand that traveling by vehicle involves risks and dangers of serious bodily injury, including permanent disability, paralysis, and death. I agree, on behalf of myself and on behalf of any minor child using the Transport Services for whom I am the parent or legal guardian, that the entire risk arising out of my use of the [employer] remains solely with me, to the maximum extent permitted under applicable law. The [employer] are provided "as is" and "as available." [facility] disclaims all representations and warranties, express, implied or statutory, not expressly set out in these terms, including the implied warranties of merchantability and fitness for a particular purpose. I hereby waive and release [facility], its agents, employees, officers, directors, representatives, insurers, attorneys, assigns, successors, subsidiaries, and affiliates from any and all past, present, or future claims, demands, liabilities, actions, causes of action, or suits of any kind directly or indirectly arising from acceptance and use of the [employer]. I further waive and release [facility] and its affiliates from all present and future liability and responsibility for any injury or death to persons or damages to property caused by or related to the use of the [employer]. I have read this Waiver and Release of Liability, and I understand the terms used in it and their legal significance. This Waiver is freely and voluntarily given with the understanding that my right (as well as the right of any minor child for whom I am the parent or legal guardian using the [employer]) to legal recourse against  in connection with the Transport  Services is knowingly surrendered in return for  use of these services.   I attest that I read the consent document to Felicia [name], gave Ms. Kemp the opportunity to ask questions and answered the questions asked (if any). I affirm that Felicia [name] then provided consent for she's participation in this program.     [name]

## 2022-08-08 ENCOUNTER — Telehealth: Payer: Self-pay

## 2022-08-08 ENCOUNTER — Ambulatory Visit: Payer: Self-pay

## 2022-08-08 DIAGNOSIS — E785 Hyperlipidemia, unspecified: Secondary | ICD-10-CM

## 2022-08-08 DIAGNOSIS — Z79899 Other long term (current) drug therapy: Secondary | ICD-10-CM

## 2022-08-08 DIAGNOSIS — E782 Mixed hyperlipidemia: Secondary | ICD-10-CM

## 2022-08-08 MED ORDER — ROSUVASTATIN CALCIUM 20 MG PO TABS
20.0000 mg | ORAL_TABLET | Freq: Every day | ORAL | 3 refills | Status: AC
Start: 2022-08-08 — End: ?

## 2022-08-08 NOTE — Telephone Encounter (Signed)
Called and spoke with patient who agrees to plan. Medication sent to pharmacy on file. Labs entered and scheduled for 11/13/21.

## 2022-08-08 NOTE — Patient Outreach (Signed)
  Care Coordination   Follow Up Visit Note   08/08/2022 Name: Felicia [name] MRN: [id number] DOB: 07/28/1950  Felicia [name] is a 72 y.o. year old female who sees Allwardt, Crist Infante, PA-C for primary care. I spoke with  Felicia [name] by phone today.  What matters to the patients health and wellness today?  Resource navigation    Goals Addressed             This Visit's Progress    Care Coordination Activities       Care Coordination Interventions: Discussed the patient recently met with an insurance representative who advised she has Medicaid and Medicare; patient was not previously aware she had Medicaid Determined the patient has decided to enroll with [facility] DSNP plan effective August 1. This plan will offer 48 trips for medical appointments as well as $326 in U Card benefits Reviewed with the patient this plan will also offer a free personal emergency response system - patient advised her apartment has several pull cords if she needs assistance Assessed for transportation needs outside of what her health plan will offer Determined the patient uses [facility] and also has a neighbor who will assist with transportation at times Discussed the patient is interested in applying for FNS again and has identified a resource to assist with transportation via the help of community resource care guide Felicia [name] Scheduled follow up call over the next two weeks to confirm all resources in place for the patient         SDOH assessments and interventions completed:  No     Care Coordination Interventions:  Yes, provided   Interventions Today    Flowsheet Row Most Recent Value  Chronic Disease   Chronic disease during today's visit Diabetes  General Interventions   General Interventions Discussed/Reviewed General Interventions Discussed, Community Resources  Nutrition Interventions   Nutrition Discussed/Reviewed Nutrition Discussed  Safety Interventions   Safety  Discussed/Reviewed Safety Discussed        Follow up plan: Follow up call scheduled for 08/22/22    Encounter Outcome:  Pt. Visit Completed   Bevelyn Ngo, Kenard Gower, CDP Social Worker, Certified Dementia Practitioner [facility]  Care Coordination [phone]

## 2022-08-08 NOTE — Telephone Encounter (Signed)
-----   Message from Kristeen Miss sent at 08/08/2022 10:58 AM EDT ----- LDL remains elevated  Her LDL goal is < 70 Trigs remain elevated   she is seeing a doctor for her DM DC Simva Start Rosuvatatin 20 mg a day  Lipids and ALT in 3 months

## 2022-08-08 NOTE — Patient Instructions (Signed)
Visit Information  Thank you for taking time to visit with me today. Please don't hesitate to contact me if I can be of assistance to you.   Following are the goals we discussed today:  - Apply for FNS benefits as planned - Provide your updated health plan cards at your next office visit - Utilize [facility] transportation benefit once eligible    Our next appointment is by telephone on 8/8 at 2:45 pm  Please call the care guide team at [phone] if you need to cancel or reschedule your appointment.   If you are experiencing a Mental Health or Behavioral Health Crisis or need someone to talk to, please call the Suicide and Crisis Lifeline: 988 go to [facility] [address], [place] [phone]) call 911  Patient verbalizes understanding of instructions and care plan provided today and agrees to view in MyChart. Active MyChart status and patient understanding of how to access instructions and care plan via MyChart confirmed with patient.     Bevelyn Ngo, BSW, CDP Social Worker, Certified Dementia Practitioner [facility] Care Management  Care Coordination [phone]

## 2022-08-11 NOTE — Progress Notes (Signed)
HEMATOLOGY-ONCOLOGY TELEPHONE VISIT PROGRESS NOTE  I connected with our patient on 08/15/22 at  3:15 PM EDT by telephone and verified that I am speaking with the correct person using two identifiers.  I discussed the limitations, risks, security and privacy concerns of performing an evaluation and management service by telephone and the availability of in person appointments.  I also discussed with the patient that there may be a patient responsible charge related to this service. The patient expressed understanding and agreed to proceed.   History of Present Illness: Felicia [name] is a 72 yo with the above mentioned on anastrozole. She presents to the clinic today for a telephone follow-up to discuss results of her bone scan.  Oncology History Overview Note   Cancer Staging  Malignant neoplasm of upper-outer quadrant of left breast in female, estrogen receptor positive (HCC) Staging form: Breast, AJCC 8th Edition - Clinical stage from 09/08/2019: Stage IA (cT1c, cN0, cM0, G2, ER+, PR+, HER2+) - Unsigned - Pathologic stage from 10/07/2019: Stage IA (pT1c, pN1, cM0, G2, ER+, PR+, HER2+) - Signed by Loa Socks, NP on 01/09/2021    Malignant neoplasm of upper-outer quadrant of left breast in female, estrogen receptor positive (HCC)  08/30/2019 Initial Biopsy   Diagnosis Breast, left, needle core biopsy, 2 o'clock, 10cmfn - INVASIVE MAMMARY CARCINOMA. Microscopic Comment The carcinoma is nuclear grade 2. Micropapillary features are noted. The greatest linear extent of tumor in any one core is 6 mm. ADDENDUM: Immunohistochemistry for E-cadherin is positive supporting a ductal phenotype.  PROGNOSTIC INDICATORS Results: The tumor cells are EQUIVOCAL for Her2 (2+). Her2 by FISH will be performed and results reported separately. Estrogen Receptor: 100%, POSITIVE, STRONG STAINING INTENSITY Progesterone Receptor: 40%, POSITIVE, STRONG STAINING INTENSITY Proliferation Marker Ki67:  20%  FLUORESCENCE IN-SITU HYBRIDIZATION Results: GROUP 1: HER2 **POSITIVE**   09/02/2019 Initial Diagnosis   Malignant neoplasm of upper-outer quadrant of left breast in female, estrogen receptor positive (HCC)   09/30/2019 - 09/30/2019 Chemotherapy   The patient had trastuzumab-dkst (OGIVRI) 630 mg in sodium chloride 0.9 % 250 mL chemo infusion, 8 mg/kg, Intravenous,  Once, 0 of 5 cycles  for chemotherapy treatment.    10/07/2019 Definitive Surgery   FINAL MICROSCOPIC DIAGNOSIS:   A. BREAST, LEFT, LUMPECTOMY:  -  Invasive ductal carcinoma, Nottingham grade 2 of 3, 1.4 cm  -  Ductal carcinoma in-situ, high grade  -  Margins uninvolved by carcinoma (see parts C, D and E)  -  Previous biopsy site changes present  -  See oncology table and comment below   B. LYMPH NODE, LEFT AXILLARY, SENTINEL, EXCISION:  -  Metastatic carcinoma involving one lymph node (1/1), 0.3 cm  -  Focal extracapsular extension   C. BREAST, LEFT, ADDITIONAL MEDIAL MARGIN, EXCISION:  -  Usual ductal hyperplasia  -  No residual carcinoma identified  -  See comment   D. BREAST, LEFT, ADDITIONAL INFERIOR MARGIN, EXCISION:  -  No residual carcinoma identified   E. BREAST, LEFT, ADDITIONAL POSTERIOR MARGIN, EXCISION:  -  No residual carcinoma identified   COMMENT:   A.  The carcinoma has micropapillary features.   C.  Cytokeratin 5/6 and E-cadherin are positive in the usual ductal  hyperplasia.    11/24/2019 - 01/28/2020 Chemotherapy   adjuvant chemotherapy consisting of carboplatin, gemcitabine, trastuzumab given every 3 weeks x4  -discontinued after 4 cycles with poor tolerance.        02/16/2020 - 11/16/2020 Chemotherapy   Patient is on Treatment Plan : BREAST  ADO-Trastuzumab Emtansine (Kadcyla) q21d      03/30/2020 - 05/12/2020 Radiation Therapy   Site Technique Total Dose (Gy) Dose per Fx (Gy) Completed Fx Beam Energies  Breast, Left: Breast_Lt 3D 50/50 2 25/25 10X  Breast, Left: Breast_Lt_SCV_PAB  3D 50/50 2 25/25 10X, 15X  Breast, Left: Breast_Lt_Bst 3D 10/10 2 5/5 6X, 10X     04/11/2020 Imaging   EXAM: MRI HEAD WITHOUT AND WITH CONTRAST  IMPRESSION: 1. No evidence of intracranial metastatic disease. 2. Mild chronic small vessel ischemia.   11/16/2020 -  Anti-estrogen oral therapy   Anastrozole, 1 mg daily     REVIEW OF SYSTEMS:   Constitutional: Denies fevers, chills or abnormal weight loss All other systems were reviewed with the patient and are negative. Observations/Objective:     Assessment Plan:  Malignant neoplasm of upper-outer quadrant of left breast in female, estrogen receptor positive (HCC) 08/30/2019: Clinical T1CN0 stage Ia grade 2 IDC ER/PR positive HER2 positive Ki-67 20% 2014: TAH/BSO for grade 3 stage I endometrial cancer status post vaginal brachytherapy 10/07/2019: Left lumpectomy: T1CN1 stage Ib grade 2 IDC with negative margins 11/24/2019-01/26/2020: Adjuvant chemo with carboplatin, gemcitabine, trastuzumab every 3 weeks x 6 02/16/2020-November 2022: TDM 1 03/30/2020-05/12/2020: Adjuvant radiation   Current treatment: Anastrozole started 11/16/2020 Anastrozole toxicities: Tolerating it fairly well without any major side effects but denies any hot flashes.   Muscle aches and pains and bone pain:  CT CAP 08/09/2021: No evidence of metastatic disease Bone scan 08/11/2021: Nonspecific uptake left humeral head recommended an x-ray of the region.      Diabetes: She has lost significant amount of weight over the past several years.    Breast cancer surveillance: mammograms 08/22/2021: Benign Bone density 03/19/2021: T score -0.7: Normal Breast exam 08/05/2022: Benign   Hypercalcemia: Stopped calcium supplementation. Bone scan 08/14/2022 Return to clinic in 1 year for follow-up   I discussed the assessment and treatment plan with the patient. The patient was provided an opportunity to ask questions and all were answered. The patient agreed with the plan and  demonstrated an understanding of the instructions. The patient was advised to call back or seek an in-person evaluation if the symptoms worsen or if the condition fails to improve as anticipated.   I provided 12 minutes of non-face-to-face time during this encounter.  This includes time for charting and coordination of care   Tamsen Meek, MD  I Janan Ridge am acting as a scribe for Dr.Vinay Gudena  I have reviewed the above documentation for accuracy and completeness, and I agree with the above.

## 2022-08-12 ENCOUNTER — Other Ambulatory Visit: Payer: Self-pay | Admitting: Cardiovascular Disease

## 2022-08-12 DIAGNOSIS — Z79899 Other long term (current) drug therapy: Secondary | ICD-10-CM

## 2022-08-12 DIAGNOSIS — E782 Mixed hyperlipidemia: Secondary | ICD-10-CM

## 2022-08-12 DIAGNOSIS — E785 Hyperlipidemia, unspecified: Secondary | ICD-10-CM

## 2022-08-14 ENCOUNTER — Ambulatory Visit (INDEPENDENT_AMBULATORY_CARE_PROVIDER_SITE_OTHER): Payer: Medicare HMO | Admitting: Family

## 2022-08-14 ENCOUNTER — Encounter (HOSPITAL_COMMUNITY)
Admission: RE | Admit: 2022-08-14 | Discharge: 2022-08-14 | Disposition: A | Discharge status: Home / Self Care | Payer: Medicare HMO | Source: Ambulatory Visit | Attending: Hematology and Oncology | Admitting: Hematology and Oncology

## 2022-08-14 VITALS — BP 127/75 | HR 61 | Temp 98.2°F | Ht 64.0 in | Wt 179.5 lb

## 2022-08-14 DIAGNOSIS — Z96642 Presence of left artificial hip joint: Secondary | ICD-10-CM | POA: Diagnosis not present

## 2022-08-14 DIAGNOSIS — R220 Localized swelling, mass and lump, head: Secondary | ICD-10-CM | POA: Diagnosis not present

## 2022-08-14 DIAGNOSIS — J029 Acute pharyngitis, unspecified: Secondary | ICD-10-CM

## 2022-08-14 DIAGNOSIS — M26622 Arthralgia of left temporomandibular joint: Secondary | ICD-10-CM

## 2022-08-14 DIAGNOSIS — Z170 Estrogen receptor positive status [ER+]: Secondary | ICD-10-CM | POA: Insufficient documentation

## 2022-08-14 DIAGNOSIS — C50412 Malignant neoplasm of upper-outer quadrant of left female breast: Secondary | ICD-10-CM | POA: Diagnosis not present

## 2022-08-14 DIAGNOSIS — Z853 Personal history of malignant neoplasm of breast: Secondary | ICD-10-CM | POA: Diagnosis not present

## 2022-08-14 DIAGNOSIS — Z96651 Presence of right artificial knee joint: Secondary | ICD-10-CM | POA: Diagnosis not present

## 2022-08-14 DIAGNOSIS — M19072 Primary osteoarthritis, left ankle and foot: Secondary | ICD-10-CM | POA: Diagnosis not present

## 2022-08-14 LAB — POCT RAPID STREP A (OFFICE): Rapid Strep A Screen: NEGATIVE

## 2022-08-14 MED ORDER — TECHNETIUM TC 99M MEDRONATE IV KIT
20.0000 | PACK | Freq: Once | INTRAVENOUS | Status: AC | PRN
  Administered 2022-08-14: 20 via INTRAVENOUS

## 2022-08-14 MED ORDER — ROSUVASTATIN CALCIUM 20 MG PO TABS
20.0000 mg | ORAL_TABLET | Freq: Every day | ORAL | 3 refills | Status: DC
Start: 2022-08-14 — End: 2022-09-03

## 2022-08-14 NOTE — Progress Notes (Signed)
Patient ID: Felicia [name], female    DOB: Nov 29, 1950, 72 y.o.   MRN: [id number]  Chief Complaint  Patient presents with   Sore Throat    sx for 3w    HPI:  Sore throat/left facial pain & swelling:   Pt bit her tongue 3 weeks ago, her tongue is swollen since that time and also c/o sore throat on left side.  She states she keeps biting her tongue due to the swelling. Pt has an appointment with ENT on 8/15 as she has seen provider in the past.  She has tried gargling with mouth wash and hot water.  Also complains of pain on the left side of her throat in upper left neck towards her ear, and she is more hoarse. Hx of TMJ, but she says this pain is dull, achy all the time, and no pain with opening her mouth or chewing, no snapping of her jaw. She reports pain with swallowing cold liquids or foods, but warm/hot liquids go down better. Reports hx of uterine & breast cancer, taking first year of Anastrozole, just seen by oncology and bone scan ordered d/t elevated calcium and head CT scan ordered but then canceled. She is concerned if cancer has spread again.  Assessment & Plan:  1. Sore throat rapid strep negative. Pt has canker sore on left side of tongue. Advised on using OTC Peroxyl rinse tid to help with healing.  - POCT rapid strep A  2. Left facial swelling- with pain in temporal region, pt states it feels different from her normal TMJ pain, want to r/o mass or possible infection.   - CT MAXILLOFACIAL WO CONTRAST; Future  3. Arthralgia of left temporomandibular joint- region of pain and swelling, continuous achy pain, pt does not believe r/t her normal TMJ pain.  - CT MAXILLOFACIAL WO CONTRAST; Future   Subjective:    Outpatient Medications Prior to Visit  Medication Sig Dispense Refill   ACCU-CHEK GUIDE test strip USE TO check blood glucose UP TO four times daily AS DIRECTED 100 strip 0   Accu-Chek Softclix Lancets lancets USE TO check blood glucose UP TO four times daily AS  DIRECTED 100 each 0   albuterol (VENTOLIN HFA) 108 (90 Base) MCG/ACT inhaler Inhale 2 puffs into the lungs every 6 (six) hours as needed for wheezing or shortness of breath. 8 g 2   ALLERGY RELIEF 10 MG tablet TAKE ONE TABLET BY MOUTH EVERY EVENING 30 tablet 11   anastrozole (ARIMIDEX) 1 MG tablet TAKE ONE TABLET BY MOUTH EVERYDAY AT BEDTIME 90 tablet 4   Ascorbic Acid (VITAMIN C PO) Take 1 tablet by mouth daily.     aspirin EC 81 MG tablet Take 81 mg by mouth daily.     bisoprolol (ZEBETA) 5 MG tablet Take 1 tablet (5 mg total) by mouth at bedtime. 90 tablet 3   blood glucose meter kit and supplies Dispense based on patient and insurance preference. Use up to four times daily as directed. (FOR ICD-10 E10.9, E11.9). 1 each 0   Calcium Citrate-Vitamin D (CALCIUM + D PO) Take 1 tablet by mouth daily with supper.     celecoxib (CELEBREX) 200 MG capsule Take 1 capsule (200 mg total) by mouth 2 (two) times daily. 180 capsule 1   CINNAMON PO Take 1 capsule by mouth daily with supper.     Continuous Glucose Receiver (FREESTYLE LIBRE 14 DAY READER) DEVI Apply 1 each topically continuous. Apply reader for monitoring of continuous glucose  monitoring. 1 each 11   Continuous Glucose Sensor (FREESTYLE LIBRE 14 DAY SENSOR) MISC APPLY EVERY 14 DAYS TO CHECK BLOOD SUGAR 1 each 11   diclofenac sodium (VOLTAREN) 1 % GEL Apply 2-4 g topically 4 (four) times daily as needed (as directed for pain).     escitalopram (LEXAPRO) 20 MG tablet TAKE ONE TABLET BY MOUTH EVERYDAY AT BEDTIME 90 tablet 1   fenofibrate (TRICOR) 145 MG tablet Take 1 tablet (145 mg total) by mouth at bedtime. 90 tablet 3   Ferrous Sulfate (IRON PO) Take 1 tablet by mouth daily. alternates days:1 tablet one day and 2 tablets the next day     gabapentin (NEURONTIN) 300 MG capsule Take 300mg  every afternoon. 30 capsule 5   gabapentin (NEURONTIN) 600 MG tablet Take 1 tablet (600 mg total) by mouth 2 (two) times daily. 60 tablet 5   glipiZIDE (GLUCOTROL  XL) 10 MG 24 hr tablet TAKE TWO TABLETS BY MOUTH EVERY MORNING 90 tablet 1   Incontinence Supply Disposable (DEPEND ADJUSTABLE UNDERWEAR) MISC 1 Application by Does not apply route daily. 16 each 11   insulin glargine (LANTUS SOLOSTAR) 100 UNIT/ML Solostar Pen Start at 4 units in the evenings. Increase by 2 units every 2 days until morning fasting glucose is to goal (140). 15 mL 11   Insulin Pen Needle (PEN NEEDLES) 32G X 4 MM MISC USE TO INJECT INSULIN 100 each 3   JARDIANCE 25 MG TABS tablet TAKE ONE TABLET BY MOUTH BEFORE BREAKFAST 90 tablet 1   levothyroxine (SYNTHROID) 125 MCG tablet TAKE ONE TABLET BY MOUTH BEFORE BREAKFAST 30 tablet 2   metFORMIN (GLUCOPHAGE) 500 MG tablet TAKE TWO TABLETS BY MOUTH TWICE DAILY 270 tablet 1   metroNIDAZOLE (METROGEL) 1 % gel Apply 1 application topically as needed (as directed to affected area). 45 g 2   Multiple Vitamins-Calcium (ONE-A-DAY WOMENS PO) Take 1 tablet by mouth daily.     nitroGLYCERIN (NITROSTAT) 0.4 MG SL tablet Dissolve 1 tablet under the tongue every 5 minutes as needed for chest pain. Max of 3 doses, then 911. 25 tablet 6   omeprazole (PRILOSEC) 40 MG capsule TAKE ONE CAPSULE BY MOUTH EVERY EVENING 90 capsule 1   rOPINIRole (REQUIP) 0.5 MG tablet TAKE ONE TABLET BY MOUTH EVERYDAY AT BEDTIME 90 tablet 1   rosuvastatin (CRESTOR) 20 MG tablet Take 1 tablet (20 mg total) by mouth daily. 90 tablet 3   Tiotropium Bromide-Olodaterol (STIOLTO RESPIMAT) 2.5-2.5 MCG/ACT AERS Inhale 2 puffs into the lungs daily. 4 g 5   tiZANidine (ZANAFLEX) 2 MG tablet Take 1-2 tablets (2-4 mg total) by mouth at bedtime as needed. 60 tablet 0   traMADol (ULTRAM) 50 MG tablet TAKE ONE TABLET BY MOUTH THREE TIMES daily AS NEEDED FOR moderate OR SEVERE pain 90 tablet 2   triamcinolone cream (KENALOG) 0.1 % Apply 1 Application topically 2 (two) times daily. For 14 days maximum 80 g 0   TURMERIC PO Take 1 capsule by mouth daily with supper.     No facility-administered  medications prior to visit.   Past Medical History:  Diagnosis Date   Anemia    Anxiety    Breast cancer (HCC) 09/2019   left breast IMC   CAD (coronary artery disease)    a. 3/2007s/p DES to the LAD [facility]); b. 01/2017 MV: EF 80%, small, mild apical ant defect w/o ischemia (felt to be breast atten). Low risk.   Depression    DM (diabetes mellitus) (HCC)  Essential hypertension    GERD (gastroesophageal reflux disease)    Headache    secondary to neck surgery per patient   High triglycerides    Hyperlipidemia    Hypothyroid    OA (osteoarthritis)    right knee,hands   Overflow incontinence    PONV (postoperative nausea and vomiting)    s/p gallbladder    RLS (restless legs syndrome)    Urinary urgency    Uterine cancer (HCC) dx'd 2014   Past Surgical History:  Procedure Laterality Date   BREAST LUMPECTOMY WITH RADIOACTIVE SEED AND SENTINEL LYMPH NODE BIOPSY Left 10/07/2019   Procedure: LEFT BREAST LUMPECTOMY WITH RADIOACTIVE SEED AND SENTINEL LYMPH NODE BIOPSY;  Surgeon: Emelia Loron, MD;  Location: [facility];  Service: General;  Laterality: Left;   CORONARY STENT PLACEMENT     CORONARY/GRAFT ACUTE MI REVASCULARIZATION     FINGER ARTHRODESIS Right 06/11/2019   Procedure: ARTHRODESIS INDEX FINGER DISTAL PHALANGEAL JOINT;  Surgeon: Betha Loa, MD;  Location: [facility];  Service: Orthopedics;  Laterality: Right;   FOOT SURGERY Left    LEFT HEART CATH AND CORONARY ANGIOGRAPHY N/A 02/16/2018   Procedure: LEFT HEART CATH AND CORONARY ANGIOGRAPHY;  Surgeon: Runell Gess, MD;  Location: [facility];  Service: Cardiovascular;  Laterality: N/A;   LEFT HEART CATH AND CORONARY ANGIOGRAPHY N/A 05/01/2020   Procedure: LEFT HEART CATH AND CORONARY ANGIOGRAPHY;  Surgeon: Runell Gess, MD;  Location: [facility];  Service: Cardiovascular;  Laterality: N/A;   PORTACATH PLACEMENT Right 11/23/2019   Procedure: INSERTION  PORT-A-CATH WITH ULTRASOUND GUIDANCE;  Surgeon: Emelia Loron, MD;  Location: [facility];  Service: General;  Laterality: Right;   REPLACEMENT TOTAL KNEE Right    SPINE SURGERY     TOTAL ABDOMINAL HYSTERECTOMY     FOR UTERUS CANCER   TOTAL HIP ARTHROPLASTY Left 2015   Allergies  Allergen Reactions   Chloraprep One Step [Chlorhexidine Gluconate] Rash   Estrogens Other (See Comments)    PATIENT HAS A HISTORY OF CANCER AND HAS BEEN TOLD TO NEVER TAKE ANYTHING CONTAINING ESTROGEN, AS IT MIGHT CAUSE A RECURRENCE   Shrimp [Shellfish Allergy] Nausea And Vomiting      Objective:    Physical Exam Vitals and nursing note reviewed.  Constitutional:      Appearance: Normal appearance.  HENT:     Head:     Jaw: Tenderness and swelling (left side) present. No pain on movement or malocclusion.      Mouth/Throat:     Mouth: Mucous membranes are moist.     Dentition: No dental tenderness, gingival swelling, dental abscesses or gum lesions.     Tongue: Lesions (small ulcer on left lateral tongue, mild swelling noted) present.     Pharynx: No pharyngeal swelling, oropharyngeal exudate, posterior oropharyngeal erythema, uvula swelling or postnasal drip.     Tonsils: No tonsillar exudate or tonsillar abscesses.  Cardiovascular:     Rate and Rhythm: Normal rate and regular rhythm.  Pulmonary:     Effort: Pulmonary effort is normal.     Breath sounds: Normal breath sounds.  Musculoskeletal:        General: Normal range of motion.  Skin:    General: Skin is warm and dry.  Neurological:     Mental Status: She is alert.  Psychiatric:        Mood and Affect: Mood normal.        Behavior: Behavior normal.  BP 127/75   Pulse 61   Temp 98.2 F (36.8 C) (Temporal)   Ht 5\' 4"  (1.626 m)   Wt 179 lb 8 oz (81.4 kg)   SpO2 95%   BMI 30.81 kg/m  Wt Readings from Last 3 Encounters:  08/14/22 179 lb 8 oz (81.4 kg)  08/06/22 176 lb (79.8 kg)  08/05/22 177 lb 12.8 oz (80.6 kg)        Dulce Sellar, NP

## 2022-08-14 NOTE — Patient Instructions (Addendum)
It was very nice to see you today!    I have sent the order for you CT scan - they will call you to schedule. You can apply ice up to 3 times per day for swelling and pain. Look for Peridoxyl mouth rinse to gargle and spit for your tongue canker sore.      PLEASE NOTE:  If you had any lab tests please let us know if you have not heard back within a few days. You may see your results on MyChart before we have a chance to review them but we will give you a call once they are reviewed by [place]. If we ordered any referrals today, please let us know if you have not heard from their office within the next week.

## 2022-08-15 ENCOUNTER — Inpatient Hospital Stay: Payer: Medicare HMO | Attending: Hematology and Oncology | Admitting: Hematology and Oncology

## 2022-08-15 DIAGNOSIS — Z170 Estrogen receptor positive status [ER+]: Secondary | ICD-10-CM

## 2022-08-15 DIAGNOSIS — C50412 Malignant neoplasm of upper-outer quadrant of left female breast: Secondary | ICD-10-CM

## 2022-08-15 NOTE — Assessment & Plan Note (Addendum)
08/30/2019: Clinical T1CN0 stage Ia grade 2 IDC ER/PR positive HER2 positive Ki-67 20% 2014: TAH/BSO for grade 3 stage I endometrial cancer status post vaginal brachytherapy 10/07/2019: Left lumpectomy: T1CN1 stage Ib grade 2 IDC with negative margins 11/24/2019-01/26/2020: Adjuvant chemo with carboplatin, gemcitabine, trastuzumab every 3 weeks x 6 02/16/2020-November 2022: TDM 1 03/30/2020-05/12/2020: Adjuvant radiation   Current treatment: Anastrozole started 11/16/2020 Anastrozole toxicities: Tolerating it fairly well without any major side effects but denies any hot flashes.   Muscle aches and pains and bone pain:  CT CAP 08/09/2021: No evidence of metastatic disease Bone scan 08/11/2021: Nonspecific uptake left humeral head recommended an x-ray of the region.      Diabetes: She has lost significant amount of weight over the past several years.    Breast cancer surveillance: mammograms 08/22/2021: Benign Bone density 03/19/2021: T score -0.7: Normal Breast exam 08/05/2022: Benign   Hypercalcemia: Stopped calcium supplementation. Bone scan 08/14/2022: No signs of bone metastases.

## 2022-08-19 ENCOUNTER — Encounter: Payer: Self-pay | Admitting: Oncology

## 2022-08-19 ENCOUNTER — Telehealth: Payer: Self-pay | Admitting: *Deleted

## 2022-08-19 NOTE — Telephone Encounter (Signed)
Telephone encounter was:  Successful.  08/19/2022 Name: Madaleine Quinlin MRN: [id number] DOB: March 10, 1950  Shamille Barngrover is a 72 y.o. year old female who is a primary care patient of [place], Crist Infante, PA-C . The community resource team was consulted for assistance with Transportation Needs   Care guide performed the following interventions: Patient provided with information about care guide support team and interviewed to confirm resource needs.patient notified of scheduled ride and waiver signed   Follow Up Plan:  No further follow up planned at this time. The patient has been provided with needed resources.  Yehuda Mao Greenauer -[facility] [place], Population Health [phone] [address] [place] , [place] [address] Email : Yehuda [email or web address]. [email or web address] .Kathia Ryburn DOB: [birth year] MRN: [id number]   RIDER WAIVER AND RELEASE OF LIABILITY  For purposes of improving physical access to our facilities, [facility] is pleased to partner with third parties to provide [facility] patients or other authorized individuals the option of convenient, on-demand ground transportation services (the [employer]") through use of the technology service that enables users to request on-demand ground transportation from independent third-party providers.  By opting to use and accept these [employer], I, the undersigned, hereby agree on behalf of myself, and on behalf of any minor child using the Science writer for whom I am the parent or legal guardian, as follows:  Science writer provided to me are provided by independent third-party transportation providers who are not [employer] or employees and who are unaffiliated with [employer]. [place] is neither a transportation carrier nor a common or public carrier. [facility] has no control over the quality or safety of the transportation that occurs as a result of the [employer]. [facility] cannot guarantee that any third-party transportation provider will complete any arranged transportation service. [facility] makes no representation, warranty, or guarantee regarding the reliability, timeliness, quality, safety, suitability, or availability of any of the Transport Services or that they will be error free. I fully understand that traveling by vehicle involves risks and dangers of serious bodily injury, including permanent disability, paralysis, and death. I agree, on behalf of myself and on behalf of any minor child using the Transport Services for whom I am the parent or legal guardian, that the entire risk arising out of my use of the [employer] remains solely with me, to the maximum extent permitted under applicable law. The [employer] are provided "as is" and "as available." [facility] disclaims all representations and warranties, express, implied or statutory, not expressly set out in these terms, including the implied warranties of merchantability and fitness for a particular purpose. I hereby waive and release [facility], its agents, employees, officers, directors, representatives, insurers, attorneys, assigns, successors, subsidiaries, and affiliates from any and all past, present, or future claims, demands, liabilities, actions, causes of action, or suits of any kind directly or indirectly arising from acceptance and use of the [employer]. I further waive and release [facility] and its affiliates from all present and future liability and responsibility for any injury or death to persons or damages to property caused by or related to the use of the [employer]. I have read this Waiver and Release of Liability, and I understand the terms used in it and their legal significance. This Waiver is freely and voluntarily given with the understanding that my right (as well as the right of any minor child for whom  I am the parent or legal guardian using  the [employer]) to legal recourse against [place] in connection with the [employer] is knowingly surrendered in return for use of these services.   I attest that I read the consent document to [name], gave Ms. Rimel the opportunity to ask questions and answered the questions asked (if any). I affirm that [name] then provided consent for she's participation in this program.     [name]

## 2022-08-22 ENCOUNTER — Ambulatory Visit: Payer: Self-pay

## 2022-08-22 NOTE — Patient Outreach (Signed)
  Care Coordination   Follow Up Visit Note   08/22/2022 Name: Feona Kuethe MRN: [id number] DOB: [birth year]  Daphanie Caler is a 72 y.o. year old female who sees Allwardt, Crist Infante, PA-C for primary care. I spoke with  [name] by phone today.  What matters to the patients health and wellness today?  To complete CT scan    Goals Addressed             This Visit's Progress    Care Coordination Activities       Care Coordination Interventions: Discussed the patient has been experiencing ear pain for several weeks and has a CT scan scheduled tomorrow  Determined the patient has not yet completed FNS application as she has not been feeling well Scheduled follow up visit over the next two weeks         SDOH assessments and interventions completed:  No     Care Coordination Interventions:  Yes, provided   Interventions Today    Flowsheet Row Most Recent Value  Chronic Disease   Chronic disease during today's visit Diabetes  General Interventions   General Interventions Discussed/Reviewed General Interventions Reviewed, Doctor Visits  Doctor Visits Discussed/Reviewed Doctor Visits Discussed        Follow up plan: Follow up call scheduled for 8/20    Encounter Outcome:  Pt. Visit Completed   Bevelyn Ngo, Kenard Gower, CDP Social Worker, Certified Dementia Practitioner [facility] Care Management  Care Coordination [phone]

## 2022-08-22 NOTE — Patient Instructions (Signed)
Visit Information  Thank you for taking time to visit with me today. Please don't hesitate to contact me if I can be of assistance to you.   Following are the goals we discussed today:  - Attend upcomming CT appointment   Our next appointment is by telephone on 8/20 at 1:30 pm  Please call the care guide team at [phone] if you need to cancel or reschedule your appointment.   If you are experiencing a Mental Health or Behavioral Health Crisis or need someone to talk to, please call 1-800-273-TALK (toll free, 24 hour hotline) go to [facility] [address] [place] [phone]) call 911  Patient verbalizes understanding of instructions and care plan provided today and agrees to view in MyChart. Active MyChart status and patient understanding of how to access instructions and care plan via MyChart confirmed with patient.     Bevelyn Ngo, BSW, CDP Social Worker, Certified Dementia Practitioner [facility] Care Management  Care Coordination [phone]

## 2022-08-23 ENCOUNTER — Ambulatory Visit
Admission: RE | Admit: 2022-08-23 | Discharge: 2022-08-23 | Disposition: A | Discharge status: Home / Self Care | Payer: 59 | Source: Ambulatory Visit | Attending: Family | Admitting: Family

## 2022-08-23 ENCOUNTER — Telehealth: Payer: Self-pay | Admitting: *Deleted

## 2022-08-23 DIAGNOSIS — M26622 Arthralgia of left temporomandibular joint: Secondary | ICD-10-CM | POA: Diagnosis not present

## 2022-08-23 DIAGNOSIS — R220 Localized swelling, mass and lump, head: Secondary | ICD-10-CM

## 2022-08-23 NOTE — Telephone Encounter (Signed)
Telephone encounter was:  Successful.  08/23/2022 Name: Felicia [name] MRN: [id number] DOB: 11/02/1950  Felicia [name] is a 72 y.o. year old female who is a primary care patient of Allwardt, Crist Infante, PA-C . The community resource team was consulted for assistance with Transportation Needs  Called to schedule transportation  Felicia [name] DOB: Oct 26, 1950 MRN: [id number]   RIDER WAIVER AND RELEASE OF LIABILITY  For purposes of improving physical access to our facilities, [facility] is pleased to partner with third parties to provide [place] patients or other authorized individuals the option of convenient, on-demand ground transportation services (the [employer]") through use of the technology service that enables users to request on-demand ground transportation from independent third-party providers.  By opting to use and accept these [employer], I, the undersigned, hereby agree on behalf of myself, and on behalf of any minor child using the Science writer for whom I am the parent or legal guardian, as follows:  Science writer provided to me are provided by independent third-party transportation providers who are not [employer] or employees and who are unaffiliated with [employer]. [place] is neither a transportation carrier nor a common or public carrier. [place] has no control over the quality or safety of the transportation that occurs as a result of the [employer]. [place] cannot guarantee that any third-party transportation provider will complete any arranged transportation service. [facility] makes no representation, warranty, or guarantee regarding the reliability, timeliness, quality, safety, suitability, or availability of any of the Transport Services or that they will be error free. I fully understand that traveling by vehicle involves risks and dangers of serious bodily injury, including permanent disability, paralysis, and  death. I agree, on behalf of myself and on behalf of any minor child using the Transport Services for whom I am the parent or legal guardian, that the entire risk arising out of my use of the [employer] remains solely with me, to the maximum extent permitted under applicable law. The [employer] are provided "as is" and "as available." [place] disclaims all representations and warranties, express, implied or statutory, not expressly set out in these terms, including the implied warranties of merchantability and fitness for a particular purpose. I hereby waive and release [facility], its agents, employees, officers, directors, representatives, insurers, attorneys, assigns, successors, subsidiaries, and affiliates from any and all past, present, or future claims, demands, liabilities, actions, causes of action, or suits of any kind directly or indirectly arising from acceptance and use of the [employer]. I further waive and release  and its affiliates from all present and future liability and responsibility for any injury or death to persons or damages to property caused by or related to the use of the [employer]. I have read this Waiver and Release of Liability, and I understand the terms used in it and their legal significance. This Waiver is freely and voluntarily given with the understanding that my right (as well as the right of any minor child for whom I am the parent or legal guardian using the [employer]) to legal recourse against  in connection with the [employer] is knowingly surrendered in return for use of these services.   I attest that I read the consent document to Felicia [name], gave Felicia [name] the opportunity to ask questions and answered the questions asked (if any). I affirm that Felicia [name] then provided consent for she's participation in this program.  [facility]                             Care  guide performed the following interventions: Patient provided with information about care guide support team and interviewed to confirm resource needs.  Follow Up Plan:  No further follow up planned at this time. The patient has been provided with needed resources.  Felicia [name] -[facility] [place], Population Health [phone] [address] [place] , [place] [address] Email : [name]

## 2022-08-26 DIAGNOSIS — Z1231 Encounter for screening mammogram for malignant neoplasm of breast: Secondary | ICD-10-CM | POA: Diagnosis not present

## 2022-08-26 LAB — HM MAMMOGRAPHY

## 2022-08-27 ENCOUNTER — Encounter: Payer: Self-pay | Admitting: Physician Assistant

## 2022-08-29 ENCOUNTER — Other Ambulatory Visit: Payer: Self-pay | Admitting: Family Medicine

## 2022-08-29 DIAGNOSIS — M26609 Unspecified temporomandibular joint disorder, unspecified side: Secondary | ICD-10-CM | POA: Diagnosis not present

## 2022-08-29 DIAGNOSIS — K140 Glossitis: Secondary | ICD-10-CM | POA: Diagnosis not present

## 2022-08-29 DIAGNOSIS — H9202 Otalgia, left ear: Secondary | ICD-10-CM | POA: Diagnosis not present

## 2022-09-02 ENCOUNTER — Ambulatory Visit: Payer: Self-pay

## 2022-09-02 ENCOUNTER — Encounter: Payer: Self-pay | Admitting: Physician Assistant

## 2022-09-02 NOTE — Patient Instructions (Signed)
Visit Information  Thank you for taking time to visit with me today. Please don't hesitate to contact me if I can be of assistance to you.   Following are the goals we discussed today:   Goals Addressed             This Visit's Progress    Diabetes Management       Patient Goals/Self Care Activities: -Patient/Caregiver will self-administer medications as prescribed as evidenced by self-report/primary caregiver report  -Patient/Caregiver will attend all scheduled provider appointments as evidenced by clinician review of documented attendance to scheduled appointments and patient/caregiver report -Patient/Caregiver will call provider office for new concerns or questions as evidenced by review of documented incoming telephone call notes and patient report  -check blood sugar at prescribed times -check blood sugar if I feel it is too high or too low -record values and write them down take them to all doctor visits    Patient reports she is doing okay.  She reports that she was supposed to get prescription refills from [facility] but has not heard anything.  She states she called and had to leave a message.  She states she is out of her by mouth medications presently.  She states that her blood sugars are better.with last check 124.  Discussed diabetes control. She is now up to 42 units of Lantus to keep sugars around 140 range.    RN CM called [facility] as well. Message left with call back information.         Our next appointment is by telephone on 10/14/22 at 1200 pm  Please call the care guide team at [phone] if you need to cancel or reschedule your appointment.   If you are experiencing a Mental Health or Behavioral Health Crisis or need someone to talk to, please call the Suicide and Crisis Lifeline: 988   Patient verbalizes understanding of instructions and care plan provided today and agrees to view in MyChart. Active MyChart status and patient understanding  of how to access instructions and care plan via MyChart confirmed with patient.     The patient has been provided with contact information for the care management team and has been advised to call with any health related questions or concerns.   Bary Leriche, RN, MSN [facility] Care Management Care Management Coordinator Direct Line [phone]

## 2022-09-03 ENCOUNTER — Ambulatory Visit: Payer: Self-pay

## 2022-09-03 ENCOUNTER — Encounter: Payer: Self-pay | Admitting: Oncology

## 2022-09-03 ENCOUNTER — Other Ambulatory Visit: Payer: Self-pay

## 2022-09-03 ENCOUNTER — Other Ambulatory Visit (HOSPITAL_COMMUNITY): Payer: Self-pay

## 2022-09-03 ENCOUNTER — Telehealth: Payer: Self-pay | Admitting: Neurology

## 2022-09-03 DIAGNOSIS — I2510 Atherosclerotic heart disease of native coronary artery without angina pectoris: Secondary | ICD-10-CM

## 2022-09-03 DIAGNOSIS — Z79899 Other long term (current) drug therapy: Secondary | ICD-10-CM

## 2022-09-03 DIAGNOSIS — E785 Hyperlipidemia, unspecified: Secondary | ICD-10-CM

## 2022-09-03 DIAGNOSIS — E782 Mixed hyperlipidemia: Secondary | ICD-10-CM

## 2022-09-03 MED ORDER — GABAPENTIN 300 MG PO CAPS
ORAL_CAPSULE | ORAL | 2 refills | Status: DC
  Filled 2022-09-03 (×2): qty 30, 30d supply, fill #0

## 2022-09-03 MED ORDER — ROSUVASTATIN CALCIUM 20 MG PO TABS
20.0000 mg | ORAL_TABLET | Freq: Every day | ORAL | 3 refills | Status: DC
Start: 2022-09-03 — End: 2022-10-23
  Filled 2022-09-03: qty 90, 90d supply, fill #0

## 2022-09-03 MED ORDER — GABAPENTIN 600 MG PO TABS
600.0000 mg | ORAL_TABLET | Freq: Two times a day (BID) | ORAL | 2 refills | Status: DC
  Filled 2022-09-03: qty 60, 30d supply, fill #0

## 2022-09-03 MED ORDER — BISOPROLOL FUMARATE 5 MG PO TABS
5.0000 mg | ORAL_TABLET | Freq: Every day | ORAL | 3 refills | Status: DC
  Filled 2022-09-03 – 2023-01-24 (×2): qty 90, 90d supply, fill #0
  Filled 2023-04-18: qty 90, 90d supply, fill #1
  Filled 2023-07-16: qty 90, 90d supply, fill #2

## 2022-09-03 MED ORDER — FENOFIBRATE 145 MG PO TABS
145.0000 mg | ORAL_TABLET | Freq: Every day | ORAL | 3 refills | Status: DC
  Filled 2022-09-03: qty 90, 90d supply, fill #0
  Filled 2022-09-26: qty 30, 30d supply, fill #0
  Filled 2022-10-22: qty 30, 30d supply, fill #1
  Filled 2022-11-25: qty 30, 30d supply, fill #2
  Filled 2022-12-25: qty 30, 30d supply, fill #3
  Filled 2023-01-24: qty 30, 30d supply, fill #4
  Filled 2023-02-17: qty 30, 30d supply, fill #5
  Filled 2023-03-18: qty 30, 30d supply, fill #6
  Filled 2023-04-17: qty 30, 30d supply, fill #7
  Filled 2023-05-12: qty 30, 30d supply, fill #8
  Filled 2023-06-09: qty 30, 30d supply, fill #9
  Filled 2023-07-09: qty 30, 30d supply, fill #10
  Filled 2023-08-04: qty 30, 30d supply, fill #11

## 2022-09-03 MED ORDER — TRIAMCINOLONE ACETONIDE 0.1 % MT PSTE
PASTE | OROMUCOSAL | 1 refills | Status: DC
  Filled 2022-09-03: qty 5, 21d supply, fill #0

## 2022-09-03 MED ORDER — NITROGLYCERIN 0.4 MG SL SUBL
SUBLINGUAL_TABLET | SUBLINGUAL | 2 refills | Status: AC
  Filled 2022-09-03: qty 75, 25d supply, fill #0

## 2022-09-03 NOTE — Telephone Encounter (Signed)
Called Pt and she reported the [facility] was not answering there phone and she needs her medicine from Dr. Everlena Cooper to be sent to [facility]. Gabapentin was ordered.

## 2022-09-03 NOTE — Telephone Encounter (Signed)
Prescription Request  09/03/2022  LOV: 07/23/2022  What is the name of the medication or equipment?  JARDIANCE 25 MG TABS tablet   levothyroxine (SYNTHROID) 125 MCG tablet   celecoxib (CELEBREX) 200 MG capsule   glipiZIDE (GLUCOTROL XL) 10 MG 24 hr tablet   metFORMIN (GLUCOPHAGE) 500 MG tablet   omeprazole (PRILOSEC) 40 MG capsule   insulin glargine (LANTUS SOLOSTAR) 100 UNIT/ML Solostar Pen   traMADol (ULTRAM) 50 MG tablet   escitalopram (LEXAPRO) 20 MG tablet   rOPINIRole (REQUIP) 0.5 MG tablet    Have you contacted your pharmacy to request a refill? Yes   Which pharmacy would you like this sent to?  [facility] - [place], [place] - [address] [place] [address] Phone: [phone] Fax: [phone]    Patient notified that their request is being sent to the clinical staff for review and that they should receive a response within 2 business days.   Please advise at Mobile [phone] (mobile)

## 2022-09-03 NOTE — Telephone Encounter (Signed)
Pt's medications were sent to pt's pharmacy as requested. Confirmation received.  

## 2022-09-03 NOTE — Telephone Encounter (Signed)
1. Which medications need to be refilled? (please list name of each medication and dose if known) Caller stated she would like to change her pharmacy to [facility]   2. Which pharmacy/location (including street and city if local pharmacy) is medication to be sent to? Gabapentin 2- 600mg  and 1- 300mg   3. Do they need a 30 day or 90 day supply?

## 2022-09-03 NOTE — Patient Outreach (Signed)
  Care Coordination   Follow Up Visit Note   09/03/2022 Name: Felicia [name] MRN: [id number] DOB: 1950-02-17  Selima Guthier is a 72 y.o. year old female who sees Allwardt, Crist Infante, PA-C for primary care. I spoke with  [name] by phone today.  What matters to the patients health and wellness today?  The patient would like to switch pharmacies in order to fill medications    Goals Addressed             This Visit's Progress    Care Coordination Activities       Care Coordination Interventions: Discussed the patient is out of medications as she did not receive a pharmacy delivery this past Friday. The patient reports she has tried to call her pharmacy multiple times and has yet to get a return call  Determined the patient would like to switch pharmacies. She has contacted [facility] but is concerned with waiting on mail order. Patient reports she would like her medications switched to [facility] pharmacy considering they deliver [facility] pharmacy who advises the patient will need her providers to send in new prescriptions as they are unable to get prescriptions transferred from patients previous pharmacy at this time Advised the patient to contact her providers, including specialists who prescribe medications for the patient to request new prescriptions be sent to [facility]. Patient stated understanding Collaboration with patients primary care providers office to advise of patients desire to switch pharmacies          SDOH assessments and interventions completed:  No     Care Coordination Interventions:  Yes, provided   Interventions Today    Flowsheet Row Most Recent Value  Chronic Disease   Chronic disease during today's visit Diabetes  General Interventions   General Interventions Discussed/Reviewed General Interventions Reviewed, Communication with  Communication with Pharmacists, PCP/Specialists        Follow up plan:  SW will  continue to follow.    Encounter Outcome:  Pt. Visit Completed   Bevelyn Ngo, BSW, CDP Social Worker, Certified Dementia Practitioner [facility] Care Management  Care Coordination [phone]

## 2022-09-03 NOTE — Patient Instructions (Signed)
Visit Information  Thank you for taking time to visit with me today. Please don't hesitate to contact me if I can be of assistance to you.   Following are the goals we discussed today:  - Contact your medical providers to request prescriptions be sent to your desired pharmacy  If you are experiencing a Mental Health or Behavioral Health Crisis or need someone to talk to, please go to [facility] [address] [place] [phone]) call 911  Patient verbalizes understanding of instructions and care plan provided today and agrees to view in MyChart. Active MyChart status and patient understanding of how to access instructions and care plan via MyChart confirmed with patient.     Bevelyn Ngo, BSW, CDP Social Worker, Certified Dementia Practitioner [facility] Care Management  Care Coordination [phone]

## 2022-09-04 ENCOUNTER — Other Ambulatory Visit (HOSPITAL_COMMUNITY): Payer: Self-pay

## 2022-09-04 ENCOUNTER — Telehealth: Payer: Self-pay | Admitting: *Deleted

## 2022-09-04 ENCOUNTER — Other Ambulatory Visit: Payer: Self-pay

## 2022-09-04 DIAGNOSIS — E119 Type 2 diabetes mellitus without complications: Secondary | ICD-10-CM

## 2022-09-04 DIAGNOSIS — R079 Chest pain, unspecified: Secondary | ICD-10-CM

## 2022-09-04 MED ORDER — METFORMIN HCL 500 MG PO TABS: 1000.0000 mg | ORAL_TABLET | Freq: Two times a day (BID) | ORAL | 1 refills | Status: DC

## 2022-09-04 MED ORDER — ROPINIROLE HCL 0.5 MG PO TABS
ORAL_TABLET | ORAL | 1 refills | Status: DC
Start: 2022-09-04 — End: 2022-10-23

## 2022-09-04 MED ORDER — CELECOXIB 200 MG PO CAPS: 200.0000 mg | ORAL_CAPSULE | Freq: Two times a day (BID) | ORAL | 1 refills | Status: AC

## 2022-09-04 MED ORDER — ESCITALOPRAM OXALATE 20 MG PO TABS: 20.0000 mg | ORAL_TABLET | Freq: Every day | ORAL | 1 refills | Status: DC

## 2022-09-04 MED ORDER — LEVOTHYROXINE SODIUM 125 MCG PO TABS: 125.0000 ug | ORAL_TABLET | Freq: Every day | ORAL | 2 refills | Status: DC

## 2022-09-04 MED ORDER — OMEPRAZOLE 40 MG PO CPDR: 40.0000 mg | DELAYED_RELEASE_CAPSULE | Freq: Every evening | ORAL | 1 refills | Status: DC

## 2022-09-04 MED ORDER — LANTUS SOLOSTAR 100 UNIT/ML ~~LOC~~ SOPN
PEN_INJECTOR | SUBCUTANEOUS | 11 refills | Status: DC
Start: 2022-09-04 — End: 2022-12-31

## 2022-09-04 MED ORDER — EMPAGLIFLOZIN 25 MG PO TABS: 25.0000 mg | ORAL_TABLET | Freq: Every day | ORAL | 1 refills | Status: DC

## 2022-09-04 MED ORDER — ANASTROZOLE 1 MG PO TABS
1.0000 mg | ORAL_TABLET | Freq: Every day | ORAL | 4 refills | Status: DC
  Filled 2022-09-04 – 2023-02-06 (×2): qty 90, 90d supply, fill #0
  Filled 2023-05-05: qty 90, 90d supply, fill #1
  Filled 2023-08-05: qty 90, 90d supply, fill #2

## 2022-09-04 NOTE — Telephone Encounter (Signed)
-----   Message from Bevelyn Ngo sent at 09/03/2022  2:17 PM EDT ----- Regarding: Felicia [name] I spoke with pt today. She reports being out of medications. They were supposed to be delivered Friday by [facility]. Patient is unable to get in touch with this pharmacy and requests medications be sent to [facility] for mail order.   I contacted [facility] and was advised they have also been unable to get in touch with [facility]. Patient was advised to contact her providers to have prescriptions sent to [facility].   I wanted to make you aware of this as the phone note from today says to send them to [facility] but that is incorrect.  Thank you so much! Bevelyn Ngo, BSW, CDP Social Worker, Certified Dementia Practitioner Care Coordination [phone]

## 2022-09-04 NOTE — Telephone Encounter (Signed)
Medications were ordered and sent to [facility] by Erin Hearing, CMA

## 2022-09-04 NOTE — Telephone Encounter (Signed)
Pt called to request anastrozole refill to [facility] as [facility] is now out of business. Called pt to let her know we sent in refill.

## 2022-09-05 ENCOUNTER — Other Ambulatory Visit (HOSPITAL_COMMUNITY): Payer: Self-pay

## 2022-09-06 ENCOUNTER — Ambulatory Visit: Payer: Self-pay

## 2022-09-06 NOTE — Patient Outreach (Signed)
  Care Coordination   Follow Up Visit Note   09/06/2022 Name: Felicia [name] MRN: [id number] DOB: [birth year]  Felicia [name] is a 72 y.o. year old female who sees Allwardt, Crist Infante, PA-C for primary care. I spoke with  [name] by phone today.  What matters to the patients health and wellness today?  Establish medication delivery with [facility]    Goals Addressed             This Visit's Progress    Care Coordination Activities       Care Coordination Interventions: Determined the patient has contacted all healthcare providers to have prescriptions sent in to [facility] pharmacy Discussed the patient received a medication delivery from [facility] on Wednesday 8/21 and was advised it would be her last delivery Confirmed the patient has all medications on hand and will contact [facility] to arrange home delivery for her next medication cycle  Discussed the patient has been in contact with a FNS worker who is mailing an application to the patients home. The patient reports she plans to complete the application and fax it back in with her social security awards letter        SDOH assessments and interventions completed:  No     Care Coordination Interventions:  Yes, provided   Interventions Today    Flowsheet Row Most Recent Value  Chronic Disease   Chronic disease during today's visit Diabetes  General Interventions   General Interventions Discussed/Reviewed General Interventions Reviewed  Pharmacy Interventions   Pharmacy Dicussed/Reviewed Pharmacy Topics Reviewed  [Confirmed receipt of medications]        Follow up plan:  SW will continue to follow    Encounter Outcome:  Pt. Visit Completed   Bevelyn Ngo, Kenard Gower, CDP Social Worker, Certified Dementia Practitioner [facility] Care Management  Care Coordination [phone]

## 2022-09-06 NOTE — Patient Instructions (Signed)
Visit Information  Thank you for taking time to visit with me today. Please don't hesitate to contact me if I can be of assistance to you.   Following are the goals we discussed today:  - Complete FNS application once received - Contact [facility] to set up home delivery  Our next appointment is by telephone on 9/6 at 12:30  Please call the care guide team at [phone] if you need to cancel or reschedule your appointment.   If you are experiencing a Mental Health or Behavioral Health Crisis or need someone to talk to, please go to [facility] [address] [place] [phone]) call 911  Patient verbalizes understanding of instructions and care plan provided today and agrees to view in MyChart. Active MyChart status and patient understanding of how to access instructions and care plan via MyChart confirmed with patient.     Bevelyn Ngo, BSW, CDP Social Worker, Certified Dementia Practitioner [facility] Care Management  Care Coordination [phone]

## 2022-09-12 ENCOUNTER — Other Ambulatory Visit (HOSPITAL_COMMUNITY): Payer: Self-pay

## 2022-09-13 ENCOUNTER — Telehealth: Payer: Self-pay | Admitting: *Deleted

## 2022-09-13 NOTE — Telephone Encounter (Signed)
   Milanie Fest DOB: 1950-05-26 MRN: [id number]   RIDER WAIVER AND RELEASE OF LIABILITY  For purposes of improving physical access to our facilities, [facility] is pleased to partner with third parties to provide [place] patients or other authorized individuals the option of convenient, on-demand ground transportation services (the [employer]") through use of the technology service that enables users to request on-demand ground transportation from independent third-party providers.  By opting to use and accept these [employer], I, the undersigned, hereby agree on behalf of myself, and on behalf of any minor child using the Science writer for whom I am the parent or legal guardian, as follows:  Science writer provided to me are provided by independent third-party transportation providers who are not [employer] or employees and who are unaffiliated with [employer]. [place] is neither a transportation carrier nor a common or public carrier. [place] has no control over the quality or safety of the transportation that occurs as a result of the [employer]. [place] cannot guarantee that any third-party transportation provider will complete any arranged transportation service. [facility] makes no representation, warranty, or guarantee regarding the reliability, timeliness, quality, safety, suitability, or availability of any of the Transport Services or that they will be error free. I fully understand that traveling by vehicle involves risks and dangers of serious bodily injury, including permanent disability, paralysis, and death. I agree, on behalf of myself and on behalf of any minor child using the Transport Services for whom I am the parent or legal guardian, that the entire risk arising out of my use of the [employer] remains solely with me, to the maximum extent permitted under applicable law. The [employer] are provided "as  is" and "as available." [place] disclaims all representations and warranties, express, implied or statutory, not expressly set out in these terms, including the implied warranties of merchantability and fitness for a particular purpose. I hereby waive and release [place], its agents, employees, officers, directors, representatives, insurers, attorneys, assigns, successors, subsidiaries, and affiliates from any and all past, present, or future claims, demands, liabilities, actions, causes of action, or suits of any kind directly or indirectly arising from acceptance and use of the [employer]. I further waive and release [place] and its affiliates from all present and future liability and responsibility for any injury or death to persons or damages to property caused by or related to the use of the [employer]. I have read this Waiver and Release of Liability, and I understand the terms used in it and their legal significance. This Waiver is freely and voluntarily given with the understanding that my right (as well as the right of any minor child for whom I am the parent or legal guardian using the [employer]) to legal recourse against [place] in connection with the [employer] is knowingly surrendered in return for use of these services.   I attest that I read the consent document to [name], gave Ms. Files the opportunity to ask questions and answered the questions asked (if any). I affirm that [name] then provided consent for she's participation in this program.     [name]

## 2022-09-15 ENCOUNTER — Other Ambulatory Visit (HOSPITAL_COMMUNITY): Payer: Self-pay

## 2022-09-15 MED ORDER — ANASTROZOLE 1 MG PO TABS
1.0000 mg | ORAL_TABLET | Freq: Every day | ORAL | 4 refills | Status: DC
  Filled 2022-09-23: qty 90, 90d supply, fill #0
  Filled 2022-09-26: qty 30, 30d supply, fill #0
  Filled 2022-10-09 – 2022-10-22 (×2): qty 30, 30d supply, fill #1
  Filled 2022-11-23: qty 30, 30d supply, fill #2
  Filled 2022-12-23: qty 30, 30d supply, fill #3

## 2022-09-15 MED ORDER — GABAPENTIN 600 MG PO TABS
600.0000 mg | ORAL_TABLET | Freq: Two times a day (BID) | ORAL | 5 refills | Status: DC
  Filled 2022-09-23: qty 60, 30d supply, fill #0

## 2022-09-15 MED ORDER — GABAPENTIN 300 MG PO CAPS
300.0000 mg | ORAL_CAPSULE | Freq: Every day | ORAL | 5 refills | Status: DC
Start: 2022-04-18 — End: 2022-09-24
  Filled 2022-09-23: qty 16, 16d supply, fill #0

## 2022-09-15 MED ORDER — TRULICITY 3 MG/0.5ML ~~LOC~~ SOAJ: 3.0000 mg | SUBCUTANEOUS | 5 refills | Status: DC

## 2022-09-16 ENCOUNTER — Other Ambulatory Visit (HOSPITAL_COMMUNITY): Payer: Self-pay

## 2022-09-17 ENCOUNTER — Other Ambulatory Visit: Payer: Self-pay

## 2022-09-17 ENCOUNTER — Other Ambulatory Visit (HOSPITAL_COMMUNITY): Payer: Self-pay

## 2022-09-17 DIAGNOSIS — M2669 Other specified disorders of temporomandibular joint: Secondary | ICD-10-CM | POA: Insufficient documentation

## 2022-09-17 DIAGNOSIS — K148 Other diseases of tongue: Secondary | ICD-10-CM | POA: Diagnosis not present

## 2022-09-17 DIAGNOSIS — J3489 Other specified disorders of nose and nasal sinuses: Secondary | ICD-10-CM | POA: Diagnosis not present

## 2022-09-17 DIAGNOSIS — M26609 Unspecified temporomandibular joint disorder, unspecified side: Secondary | ICD-10-CM | POA: Insufficient documentation

## 2022-09-17 MED ORDER — METFORMIN HCL 500 MG PO TABS
1000.0000 mg | ORAL_TABLET | Freq: Two times a day (BID) | ORAL | 1 refills | Status: DC
  Filled 2022-09-17: qty 270, 68d supply, fill #0
  Filled 2022-09-23: qty 360, 90d supply, fill #0
  Filled 2022-09-26: qty 120, 30d supply, fill #0
  Filled 2022-10-22: qty 120, 30d supply, fill #1
  Filled 2022-11-25: qty 120, 30d supply, fill #2

## 2022-09-17 MED ORDER — CELECOXIB 200 MG PO CAPS
200.0000 mg | ORAL_CAPSULE | Freq: Two times a day (BID) | ORAL | 1 refills | Status: DC
  Filled 2022-09-17 – 2022-09-23 (×3): qty 180, 90d supply, fill #0
  Filled 2022-09-26: qty 60, 30d supply, fill #0
  Filled 2022-10-22: qty 60, 30d supply, fill #1
  Filled 2022-11-25: qty 60, 30d supply, fill #2
  Filled 2022-12-25: qty 60, 30d supply, fill #3

## 2022-09-17 MED ORDER — TRIAMCINOLONE ACETONIDE 0.1 % MT PSTE
PASTE | OROMUCOSAL | 1 refills | Status: DC
  Filled 2022-09-17: qty 5, 30d supply, fill #0

## 2022-09-17 MED ORDER — INSULIN GLARGINE 100 UNIT/ML SOLOSTAR PEN
PEN_INJECTOR | SUBCUTANEOUS | 11 refills | Status: DC
  Filled 2022-09-17 – 2022-10-04 (×2): qty 15, 30d supply, fill #0

## 2022-09-17 MED ORDER — TRIAMCINOLONE ACETONIDE 0.1 % MT PSTE
PASTE | OROMUCOSAL | 1 refills | Status: DC
  Filled 2022-09-17: qty 5, 30d supply, fill #0
  Filled 2022-10-22: qty 5, 30d supply, fill #1

## 2022-09-17 MED ORDER — ESCITALOPRAM OXALATE 20 MG PO TABS
20.0000 mg | ORAL_TABLET | Freq: Every day | ORAL | 1 refills | Status: DC
  Filled 2022-09-17 – 2022-09-23 (×2): qty 90, 90d supply, fill #0
  Filled 2022-09-26: qty 30, 30d supply, fill #0
  Filled 2022-10-22: qty 30, 30d supply, fill #1
  Filled 2022-11-25: qty 30, 30d supply, fill #2
  Filled 2022-12-25: qty 30, 30d supply, fill #3

## 2022-09-17 MED ORDER — TIZANIDINE HCL 2 MG PO TABS
2.0000 mg | ORAL_TABLET | Freq: Every evening | ORAL | 0 refills | Status: DC | PRN
  Filled 2022-09-17: qty 60, 30d supply, fill #0

## 2022-09-17 MED ORDER — ROSUVASTATIN CALCIUM 20 MG PO TABS
20.0000 mg | ORAL_TABLET | Freq: Every day | ORAL | 3 refills | Status: DC
  Filled 2022-09-17 – 2022-09-23 (×3): qty 90, 90d supply, fill #0
  Filled 2022-09-26: qty 30, 30d supply, fill #0
  Filled 2022-10-22: qty 30, 30d supply, fill #1

## 2022-09-17 MED ORDER — NITROGLYCERIN 0.4 MG SL SUBL
0.4000 mg | SUBLINGUAL_TABLET | SUBLINGUAL | 6 refills | Status: DC | PRN
  Filled 2022-09-17: qty 25, 7d supply, fill #0

## 2022-09-17 MED ORDER — ROPINIROLE HCL 0.5 MG PO TABS
0.5000 mg | ORAL_TABLET | Freq: Every day | ORAL | 1 refills | Status: DC
  Filled 2022-09-17 – 2022-09-23 (×2): qty 90, 90d supply, fill #0
  Filled 2022-09-26: qty 30, 30d supply, fill #0
  Filled 2022-10-22: qty 30, 30d supply, fill #1
  Filled 2022-11-25: qty 30, 30d supply, fill #2
  Filled 2022-12-25: qty 30, 30d supply, fill #3
  Filled 2023-01-24: qty 30, 30d supply, fill #4
  Filled 2023-02-17: qty 30, 30d supply, fill #5

## 2022-09-17 MED ORDER — FENOFIBRATE 145 MG PO TABS
145.0000 mg | ORAL_TABLET | Freq: Every day | ORAL | 3 refills | Status: DC
  Filled 2022-09-17 – 2022-09-23 (×2): qty 90, 90d supply, fill #0
  Filled 2022-09-26: qty 30, 30d supply, fill #0
  Filled 2022-10-22: qty 90, 90d supply, fill #0

## 2022-09-17 MED ORDER — ASPIRIN 81 MG PO TBEC
81.0000 mg | DELAYED_RELEASE_TABLET | Freq: Every morning | ORAL | 99 refills | Status: DC
  Filled 2022-09-17 – 2022-09-23 (×2): qty 90, 90d supply, fill #0
  Filled 2022-09-26: qty 30, 30d supply, fill #0
  Filled 2022-10-22: qty 30, 30d supply, fill #1
  Filled 2022-11-25: qty 30, 30d supply, fill #2
  Filled 2022-12-25: qty 30, 30d supply, fill #3
  Filled 2023-01-24: qty 90, 90d supply, fill #4
  Filled 2023-04-18: qty 90, 90d supply, fill #5
  Filled 2023-07-16: qty 90, 90d supply, fill #6

## 2022-09-17 MED ORDER — FREESTYLE LIBRE 2 SENSOR MISC
11 refills | Status: DC
  Filled 2022-09-17 – 2022-09-26 (×3): qty 2, 28d supply, fill #0
  Filled 2022-10-22: qty 2, 28d supply, fill #1

## 2022-09-17 MED ORDER — EMPAGLIFLOZIN 25 MG PO TABS
25.0000 mg | ORAL_TABLET | Freq: Every day | ORAL | 1 refills | Status: DC
  Filled 2022-09-17 – 2022-09-23 (×2): qty 90, 90d supply, fill #0
  Filled 2022-09-26: qty 30, 30d supply, fill #0
  Filled 2022-10-22: qty 30, 30d supply, fill #1
  Filled 2022-11-25: qty 30, 30d supply, fill #2
  Filled 2022-12-25: qty 30, 30d supply, fill #3
  Filled 2023-01-24: qty 30, 30d supply, fill #4
  Filled 2023-02-17: qty 30, 30d supply, fill #5

## 2022-09-17 MED ORDER — INSULIN PEN NEEDLE 32G X 4 MM MISC
3 refills | Status: DC
  Filled 2022-09-17: qty 100, 30d supply, fill #0
  Filled 2022-10-22: qty 100, 30d supply, fill #1

## 2022-09-17 MED ORDER — TRAMADOL HCL 50 MG PO TABS
50.0000 mg | ORAL_TABLET | Freq: Three times a day (TID) | ORAL | 2 refills | Status: DC | PRN
  Filled 2022-09-17 – 2022-10-01 (×2): qty 90, 30d supply, fill #0

## 2022-09-17 MED ORDER — OMEPRAZOLE 40 MG PO CPDR
40.0000 mg | DELAYED_RELEASE_CAPSULE | Freq: Every evening | ORAL | 1 refills | Status: DC
  Filled 2022-09-17 – 2022-09-23 (×2): qty 90, 90d supply, fill #0
  Filled 2022-09-26: qty 30, 30d supply, fill #0
  Filled 2022-10-22: qty 30, 30d supply, fill #1
  Filled 2022-11-25: qty 30, 30d supply, fill #2
  Filled 2022-12-25: qty 30, 30d supply, fill #3

## 2022-09-17 MED ORDER — BISOPROLOL FUMARATE 5 MG PO TABS
5.0000 mg | ORAL_TABLET | Freq: Every day | ORAL | 3 refills | Status: DC
  Filled 2022-09-17 – 2022-09-23 (×2): qty 90, 90d supply, fill #0
  Filled 2022-09-26: qty 30, 30d supply, fill #0
  Filled 2022-10-22: qty 30, 30d supply, fill #1
  Filled 2022-11-25: qty 30, 30d supply, fill #2
  Filled 2022-12-25: qty 30, 30d supply, fill #3

## 2022-09-17 MED ORDER — LEVOTHYROXINE SODIUM 125 MCG PO TABS
125.0000 ug | ORAL_TABLET | Freq: Every day | ORAL | 2 refills | Status: DC
  Filled 2022-09-17 – 2022-09-26 (×3): qty 30, 30d supply, fill #0
  Filled 2022-10-22: qty 30, 30d supply, fill #1
  Filled 2022-11-25: qty 30, 30d supply, fill #2

## 2022-09-18 ENCOUNTER — Other Ambulatory Visit (HOSPITAL_COMMUNITY): Payer: Self-pay

## 2022-09-18 ENCOUNTER — Other Ambulatory Visit: Payer: Self-pay

## 2022-09-20 ENCOUNTER — Ambulatory Visit: Payer: Self-pay

## 2022-09-20 ENCOUNTER — Other Ambulatory Visit (HOSPITAL_COMMUNITY): Payer: Self-pay

## 2022-09-20 NOTE — Patient Outreach (Signed)
  Care Coordination   Follow Up Visit Note   09/20/2022 Name: Felicia [name] MRN: [id number] DOB: 24-Oct-1950  Felicia [name] is a 72 y.o. year old female who sees Allwardt, Crist Infante, PA-C for primary care. I spoke with  [name] by phone today.  What matters to the patients health and wellness today?  Patient is concerns with TMJ and tongue ulcer - she has been working with an Actuary  to address these concerns. Next follow up visit scheduled for 10/15/22.   Goals Addressed             This Visit's Progress    COMPLETED: Care Coordination Activities       Care Coordination Interventions: Determined the patient has received a medication delivery from [facility] - no concerns at this time with medication adherence Discussed the patient has yet to receive application from FNS - provided the patient with contact number to FNS to follow up on this as needed Reminded the patient of future telephonic appointment with RN Care Manager encouraging patient to contact care coordination team as needed        SDOH assessments and interventions completed:  No     Care Coordination Interventions:  Yes, provided   Interventions Today    Flowsheet Row Most Recent Value  Chronic Disease   Chronic disease during today's visit Diabetes  General Interventions   General Interventions Discussed/Reviewed General Interventions Reviewed, [employer]  [Discussed the patient has yet to receive paperwork from FNS office - number provided for patient to follow up on this]        Follow up plan: No further intervention required.   Encounter Outcome:  Patient Visit Completed   Bevelyn Ngo, Kenard Gower, CDP Social Worker, Certified Dementia Practitioner [facility] Care Management  Care Coordination [phone]

## 2022-09-20 NOTE — Patient Instructions (Signed)
Visit Information  Thank you for taking time to visit with me today. Please don't hesitate to contact me if I can be of assistance to you.   Following are the goals we discussed today:  - Follow up with [facility] office as needed to complete food stamp application - Contact your primary care provider as needed  Your next appointment is by telephone on 9/30 at 12:00 with RN Care Manager Fleeta Emmer  Please call the care guide team at [phone] if you need to cancel or reschedule your appointment.   If you are experiencing a Mental Health or Behavioral Health Crisis or need someone to talk to, please call 1-800-273-TALK (toll free, 24 hour hotline) go to [facility] [address] [place] [phone]) call 911  Patient verbalizes understanding of instructions and care plan provided today and agrees to view in MyChart. Active MyChart status and patient understanding of how to access instructions and care plan via MyChart confirmed with patient.     No further follow up required: Please contact me as needed.  Bevelyn Ngo, BSW, CDP Social Worker, Certified Dementia Practitioner [facility] Care Management  Care Coordination [phone]

## 2022-09-21 ENCOUNTER — Other Ambulatory Visit: Payer: Self-pay

## 2022-09-23 ENCOUNTER — Other Ambulatory Visit (HOSPITAL_COMMUNITY): Payer: Self-pay

## 2022-09-23 ENCOUNTER — Other Ambulatory Visit: Payer: Self-pay

## 2022-09-23 ENCOUNTER — Encounter: Payer: Self-pay | Admitting: Oncology

## 2022-09-23 MED FILL — Glipizide Tab ER 24HR 10 MG: ORAL | 30 days supply | Qty: 60 | Fill #0 | Status: CN

## 2022-09-23 MED FILL — Loratadine Tab 10 MG: ORAL | 30 days supply | Qty: 30 | Fill #0 | Status: CN

## 2022-09-23 NOTE — Progress Notes (Unsigned)
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  If no improvement in 4 weeks, I would have her take tizanidine 2mg  three times daily. Follow up in 6 months        Subjective:  Felicia [name] is a 72 year old female with CAD, HTN, HLD, DM II, hypothyroidism, osteoarthritis, depression, anxiety and history of breast cancer and uterine cancer who follows up for headache.  UPDATE: Still with various headaches daily but less severe on gabapentin.  She has left TMJ and left side of face aches.  CT sinuses from last month revealed no evidence of TMJ arthropathy.   Current NSAIDS/analgesics:  ASA 81mg  daily, celecoxib, tramadol Current triptans:  none Current ergotamine:  none Current anti-emetic:  Compazine Current muscle relaxants:  tizanidine at bedtime PRN Current Antihypertensive medications:  none Current Antidepressant medications:  escitalopram 20mg  Current Anticonvulsant medications:  gabapentin 600mg /300mg /600mg  Current anti-CGRP:  none Current Antihistamines/Decongestants:  Claritin Other therapy:  none Other medications:  ropinirole 0.5mg  QHS     Caffeine:  no coffee.  Diet cola.  Decaf tea   HISTORY: She has had occasional sharp right sided paroxysmal headaches since childhood.  In 2017, she started developing occipital headaches with neck pain, left arm pain and balance problems.  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  Continues to have chronic neck pain and headache.  Neck pain radiates down into both shoulders.  Headache is often diffuse and throbbing but still has occasional sharp right sided headache.  Also may have left eye pain that radiates to top of head.  Sometimes phonophobia but usually  not.  No nausea, vomiting, photophobia or visual disturbance.  Always with a persistent dull headache but has exacerbations everyday lasting a few hours.  Takes Tylenol or another "headache medication" daily.  Cannot get injections due to hardware in the cervical spine.     MRI of brain with and without contrast on 02/14/2021 personally reviewed showed mild chronic small vessel ischemic changes within the cerebral white matter but overall unremarkable.     Past NSAIDS/analgesics:  tramadol Past abortive triptans:  none Past abortive ergotamine:  none Past muscle relaxants:  tizanidine Past anti-emetic:  Zofran 8mg  Past antihypertensive medications:  none Past antidepressant medications:  amitriptyline (helpful) Past anticonvulsant medications:  none Past anti-CGRP:  none Past vitamins/Herbal/Supplements:  none Past antihistamines/decongestants:  none Other past therapies:  none    Family history of headache:  unknown.      PAST MEDICAL HISTORY: Past Medical History:  Diagnosis Date   Anemia    Anxiety    Breast cancer (HCC) 09/2019   left breast IMC   CAD (coronary artery disease)    a. 3/2007s/p DES to the LAD [facility]); b. 01/2017 MV: EF 80%, small, mild apical ant defect w/o ischemia (felt to be breast atten). Low risk.   Depression    DM (diabetes mellitus) (HCC)    Essential hypertension    GERD (gastroesophageal reflux disease)    Headache    secondary to neck surgery per patient   High triglycerides    Hyperlipidemia    Hypothyroid    OA (osteoarthritis)    right knee,hands   Overflow incontinence    PONV (postoperative nausea and vomiting)    s/p gallbladder  RLS (restless legs syndrome)    Urinary urgency    Uterine cancer (HCC) dx'd 2014    MEDICATIONS: Current Outpatient Medications on File Prior to Visit  Medication Sig Dispense Refill   ACCU-CHEK GUIDE test strip USE TO check blood glucose UP TO four times daily AS DIRECTED 100 strip 0    Accu-Chek Softclix Lancets lancets USE TO check blood glucose UP TO four times daily AS DIRECTED 100 each 0   albuterol (VENTOLIN HFA) 108 (90 Base) MCG/ACT inhaler Inhale 2 puffs into the lungs every 6 (six) hours as needed for wheezing or shortness of breath. 8.5 g 2   loratadine (ALLERGY RELIEF) 10 MG tablet Take 1 tablet (10 mg total) by mouth every evening. 30 tablet 11   anastrozole (ARIMIDEX) 1 MG tablet Take 1 tablet (1 mg total) by mouth daily. 90 tablet 4   anastrozole (ARIMIDEX) 1 MG tablet Take 1 tablet (1 mg total) by mouth at bedtime. 90 tablet 4   Ascorbic Acid (VITAMIN C PO) Take 1 tablet by mouth daily.     aspirin EC (ASPIRIN 81) 81 MG tablet Take 1 tablet (81 mg total) by mouth in the morning. 90 tablet 99   aspirin EC 81 MG tablet Take 81 mg by mouth daily.     bisoprolol (ZEBETA) 5 MG tablet Take 1 tablet (5 mg total) by mouth at bedtime. 90 tablet 3   bisoprolol (ZEBETA) 5 MG tablet Take 1 tablet (5 mg total) by mouth at bedtime. 90 tablet 3   blood glucose meter kit and supplies Dispense based on patient and insurance preference. Use up to four times daily as directed. (FOR ICD-10 E10.9, E11.9). 1 each 0   Calcium Citrate-Vitamin D (CALCIUM + D PO) Take 1 tablet by mouth daily with supper.     celecoxib (CELEBREX) 200 MG capsule Take 1 capsule (200 mg total) by mouth 2 (two) times daily. 180 capsule 1   celecoxib (CELEBREX) 200 MG capsule Take 1 capsule (200 mg total) by mouth 2 (two) times daily. 180 capsule 1   CINNAMON PO Take 1 capsule by mouth daily with supper.     Continuous Glucose Receiver (FREESTYLE LIBRE 14 DAY READER) DEVI Apply reader for monitoring of continuous glucose monitoring. 1 each 11   Continuous Glucose Sensor (FREESTYLE LIBRE 14 DAY SENSOR) MISC APPLY EVERY 14 DAYS TO CHECK BLOOD SUGAR 1 each 11   Continuous Glucose Sensor (FREESTYLE LIBRE 2 SENSOR) MISC Apply new sensor every 14 days to monitor blood glucose. Remove old sensor before applying new one.  1 each 11   diclofenac sodium (VOLTAREN) 1 % GEL Apply 2-4 g topically 4 (four) times daily as needed (as directed for pain).     Dulaglutide (TRULICITY) 3 MG/0.5ML SOPN Inject 3 mg into the skin once a week. 6 mL 5   empagliflozin (JARDIANCE) 25 MG TABS tablet Take 1 tablet (25 mg total) by mouth daily. 90 tablet 1   empagliflozin (JARDIANCE) 25 MG TABS tablet Take 1 tablet (25 mg total) by mouth daily. 90 tablet 1   escitalopram (LEXAPRO) 20 MG tablet Take 1 tablet (20 mg total) by mouth daily. 90 tablet 1   escitalopram (LEXAPRO) 20 MG tablet Take 1 tablet (20 mg total) by mouth daily. 90 tablet 1   fenofibrate (TRICOR) 145 MG tablet Take 1 tablet (145 mg total) by mouth at bedtime. 90 tablet 3   fenofibrate (TRICOR) 145 MG tablet Take 1 tablet (145 mg total) by mouth at  bedtime. 90 tablet 3   Ferrous Sulfate (IRON PO) Take 1 tablet by mouth daily. alternates days:1 tablet one day and 2 tablets the next day     gabapentin (NEURONTIN) 300 MG capsule Take 1 capsule every afternoon. 30 capsule 2   gabapentin (NEURONTIN) 300 MG capsule Take 1 capsule (300 mg total) by mouth daily in the afternoon. 30 capsule 5   gabapentin (NEURONTIN) 600 MG tablet Take 1 tablet (600 mg total) by mouth 2 (two) times daily. 60 tablet 2   gabapentin (NEURONTIN) 600 MG tablet Take 1 tablet (600 mg total) by mouth in the morning and at bedtime. 60 tablet 5   glipiZIDE (GLUCOTROL XL) 10 MG 24 hr tablet TAKE TWO TABLETS BY MOUTH EVERY MORNING 90 tablet 1   Incontinence Supply Disposable (DEPEND ADJUSTABLE UNDERWEAR) MISC 1 Application by Does not apply route daily. 16 each 11   insulin glargine (LANTUS SOLOSTAR) 100 UNIT/ML Solostar Pen Start at 4 units in the evenings. Increase by 2 units every 2 days until morning fasting glucose is to goal (140). 15 mL 11   insulin glargine (LANTUS) 100 UNIT/ML Solostar Pen Start with injecting 4 units under the skin in the evenings. Increase by 2 units every 2 days until monring  fasting glucose is to goal (140) 15 mL 11   Insulin Pen Needle (PEN NEEDLES) 32G X 4 MM MISC USE TO INJECT INSULIN 100 each 3   Insulin Pen Needle 32G X 4 MM MISC Use to inject insulin as directed 100 each 3   levothyroxine (SYNTHROID) 125 MCG tablet Take 1 tablet (125 mcg total) by mouth daily before breakfast. 30 tablet 2   levothyroxine (SYNTHROID) 125 MCG tablet Take 1 tablet (125 mcg total) by mouth daily before breakfast. 30 tablet 2   metFORMIN (GLUCOPHAGE) 500 MG tablet Take 2 tablets (1,000 mg total) by mouth 2 (two) times daily. 270 tablet 1   metFORMIN (GLUCOPHAGE) 500 MG tablet Take 2 tablets (1,000 mg total) by mouth 2 (two) times daily. 270 tablet 1   metroNIDAZOLE (METROGEL) 1 % gel Apply 1 application topically as needed (as directed to affected area). 45 g 2   Multiple Vitamins-Calcium (ONE-A-DAY WOMENS PO) Take 1 tablet by mouth daily.     nitroGLYCERIN (NITROSTAT) 0.4 MG SL tablet Dissolve 1 tablet under the tongue every 5 minutes as needed for chest pain. Max of 3 doses, then 911. 75 tablet 2   nitroGLYCERIN (NITROSTAT) 0.4 MG SL tablet Place 1 tablet (0.4 mg total) under the tongue as needed for chest pain. May repeat every 5 minutes for up to 2 doses. If no relief call 9-1-1 25 tablet 6   omeprazole (PRILOSEC) 40 MG capsule Take 1 capsule (40 mg total) by mouth every evening. 90 capsule 1   omeprazole (PRILOSEC) 40 MG capsule Take 1 capsule (40 mg total) by mouth every evening. 90 capsule 1   rOPINIRole (REQUIP) 0.5 MG tablet TAKE ONE TABLET BY MOUTH EVERYDAY AT BEDTIME 90 tablet 1   rOPINIRole (REQUIP) 0.5 MG tablet Take 1 tablet (0.5 mg total) by mouth at bedtime. 90 tablet 1   rosuvastatin (CRESTOR) 20 MG tablet Take 1 tablet (20 mg total) by mouth daily. 90 tablet 3   rosuvastatin (CRESTOR) 20 MG tablet Take 1 tablet (20 mg total) by mouth daily. 90 tablet 3   Tiotropium Bromide-Olodaterol (STIOLTO RESPIMAT) 2.5-2.5 MCG/ACT AERS Inhale 2 puffs into the lungs daily. 4 g 5    tiZANidine (ZANAFLEX) 2 MG tablet  TAKE 1 OR 2 TABLETS BY MOUTH AT BEDTIME AS NEEDED 60 tablet 0   tiZANidine (ZANAFLEX) 2 MG tablet Take 1-2 tablets (2-4 mg total) by mouth at bedtime as needed. 60 tablet 0   traMADol (ULTRAM) 50 MG tablet TAKE ONE TABLET BY MOUTH THREE TIMES daily AS NEEDED FOR moderate OR SEVERE pain 90 tablet 2   traMADol (ULTRAM) 50 MG tablet Take 1 tablet (50 mg total) by mouth 3 (three) times daily as needed for moderate or severe pain 90 tablet 2   triamcinolone (KENALOG) 0.1 % paste Apply a small amount to affected area twice daily for 2 - 3 weeks. 5 g 1   triamcinolone (KENALOG) 0.1 % paste Apply a small amount twice daily to left side of tongue for 2-3 weeks 5 g 1   triamcinolone (KENALOG) 0.1 % paste Apply a small amount to affected area on left side of tongue twice daily. 5 g 1   triamcinolone cream (KENALOG) 0.1 % Apply 1 Application topically 2 (two) times daily. For 14 days maximum 80 g 0   TURMERIC PO Take 1 capsule by mouth daily with supper.     No current facility-administered medications on file prior to visit.    ALLERGIES: Allergies  Allergen Reactions   Chloraprep One Step [Chlorhexidine Gluconate] Rash   Estrogens Other (See Comments)    PATIENT HAS A HISTORY OF CANCER AND HAS BEEN TOLD TO NEVER TAKE ANYTHING CONTAINING ESTROGEN, AS IT MIGHT CAUSE A RECURRENCE   Shrimp [Shellfish Allergy] Nausea And Vomiting    FAMILY HISTORY: Family History  Problem Relation Age of Onset   AAA (abdominal aortic aneurysm) Mother    Diabetes Father    Alzheimer's disease Father    Throat cancer Sister    Breast cancer Cousin       Objective:  Blood pressure (!) 111/59, pulse 74, height 5\' 4"  (1.626 m), weight 180 lb (81.6 kg), SpO2 95%. General: No acute distress.  Patient appears well-groomed.   Head:  Normocephalic/atraumatic, tenderness to palpation of left TMJ Neck:  Supple.  bilateral paraspinal tenderness.  Full range of motion. Heart:  Regular rate  and rhythm. Neuro:  Alert and oriented.  Speech fluent and not dysarthric.  Language intact.  CN II-XII intact.  Bulk and tone normal.  Muscle strength 5/5 throughout.  Deep tendon reflexes 2+ throughout.  Gait normal.  Romberg negative.     Shon Millet, DO  CC: [name], PA-C

## 2022-09-24 ENCOUNTER — Other Ambulatory Visit (HOSPITAL_COMMUNITY): Payer: Self-pay

## 2022-09-24 ENCOUNTER — Other Ambulatory Visit: Payer: Self-pay

## 2022-09-24 ENCOUNTER — Encounter: Payer: Self-pay | Admitting: Neurology

## 2022-09-24 ENCOUNTER — Ambulatory Visit (INDEPENDENT_AMBULATORY_CARE_PROVIDER_SITE_OTHER): Payer: 59 | Admitting: Neurology

## 2022-09-24 VITALS — BP 111/59 | HR 74 | Ht 64.0 in | Wt 180.0 lb

## 2022-09-24 DIAGNOSIS — G4486 Cervicogenic headache: Secondary | ICD-10-CM | POA: Diagnosis not present

## 2022-09-24 DIAGNOSIS — M26609 Unspecified temporomandibular joint disorder, unspecified side: Secondary | ICD-10-CM

## 2022-09-24 MED ORDER — GABAPENTIN 600 MG PO TABS
600.0000 mg | ORAL_TABLET | Freq: Three times a day (TID) | ORAL | 5 refills | Status: DC
  Filled 2022-09-24 – 2022-09-26 (×2): qty 90, 30d supply, fill #0
  Filled 2022-10-22: qty 90, 30d supply, fill #1
  Filled 2022-11-25: qty 90, 30d supply, fill #2
  Filled 2022-12-25: qty 90, 30d supply, fill #3
  Filled 2023-01-24: qty 90, 30d supply, fill #4
  Filled 2023-02-17: qty 90, 30d supply, fill #5

## 2022-09-24 NOTE — Patient Instructions (Signed)
Stop gabapentin 300mg  pill in afternoon Increase gabapentin 600mg  pill to three times daily If no significant improvement in 4 weeks, contact me and I will have you take tizanidine three times daily

## 2022-09-25 ENCOUNTER — Other Ambulatory Visit: Payer: Self-pay

## 2022-09-26 ENCOUNTER — Other Ambulatory Visit: Payer: Self-pay

## 2022-09-26 ENCOUNTER — Telehealth: Payer: Self-pay | Admitting: *Deleted

## 2022-09-26 ENCOUNTER — Encounter: Payer: Self-pay | Admitting: Oncology

## 2022-09-26 ENCOUNTER — Other Ambulatory Visit (HOSPITAL_COMMUNITY): Payer: Self-pay

## 2022-09-26 MED FILL — Loratadine Tab 10 MG: ORAL | 30 days supply | Qty: 30 | Fill #0 | Status: AC

## 2022-09-26 MED FILL — Glipizide Tab ER 24HR 10 MG: ORAL | 30 days supply | Qty: 60 | Fill #0 | Status: AC

## 2022-09-26 NOTE — Telephone Encounter (Signed)
Telephone encounter was:  Successful.  09/26/2022 Name: Felicia [name] MRN: [id number] DOB: 1950/10/22  Felicia [name] is a 72 y.o. year old female who is a primary care patient of Allwardt, Crist Infante, PA-C . The community resource team was consulted for assistance with Transportation Needs  Called to schedule transportation  Care guide performed the following interventions: Patient provided with information about care guide support team and interviewed to confirm resource needs.  Follow Up Plan:  No further follow up planned at this time. The patient has been provided with needed resources.  [name] [facility]  Population Health Careguide  Direct Dial: [phone] Website: [name]     Felicia [name] DOB: 03-10-1950 MRN: [id number]   RIDER WAIVER AND RELEASE OF LIABILITY  For purposes of improving physical access to our facilities, [facility] is pleased to partner with third parties to provide [facility] patients or other authorized individuals the option of convenient, on-demand ground transportation services (the [employer]") through use of the technology service that enables users to request on-demand ground transportation from independent third-party providers.  By opting to use and accept these [employer], I, the undersigned, hereby agree on behalf of myself, and on behalf of any minor child using the Science writer for whom I am the parent or legal guardian, as follows:  Science writer provided to me are provided by independent third-party transportation providers who are not [employer] or employees and who are unaffiliated with [employer]. [facility] is neither a transportation carrier nor a common or public carrier. [facility] has no control over the quality or safety of the transportation that occurs as a result of the [employer]. [facility] cannot guarantee that any third-party transportation provider will  complete any arranged transportation service. [facility] makes no representation, warranty, or guarantee regarding the reliability, timeliness, quality, safety, suitability, or availability of any of the Transport Services or that they will be error free. I fully understand that traveling by vehicle involves risks and dangers of serious bodily injury, including permanent disability, paralysis, and death. I agree, on behalf of myself and on behalf of any minor child using the Transport Services for whom I am the parent or legal guardian, that the entire risk arising out of my use of the [employer] remains solely with me, to the maximum extent permitted under applicable law. The [employer] are provided "as is" and "as available." [facility] disclaims all representations and warranties, express, implied or statutory, not expressly set out in these terms, including the implied warranties of merchantability and fitness for a particular purpose. I hereby waive and release [facility], its agents, employees, officers, directors, representatives, insurers, attorneys, assigns, successors, subsidiaries, and affiliates from any and all past, present, or future claims, demands, liabilities, actions, causes of action, or suits of any kind directly or indirectly arising from acceptance and use of the [employer]. I further waive and release [facility] and its affiliates from all present and future liability and responsibility for any injury or death to persons or damages to property caused by or related to the use of the [employer]. I have read this Waiver and Release of Liability, and I understand the terms used in it and their legal significance. This Waiver is freely and voluntarily given with the understanding that my right (as well as the right of any minor child for whom I am the parent or legal guardian using the [employer]) to legal recourse  against [place] in connection  with the Transport Services is knowingly surrendered in return for use of these services.   I attest that I read the consent document to [name], gave Ms. K[place]un the opportunity to ask questions and answered the questions asked (if any). I affirm that [name] then provided consent for she's participation in this program.     [name]

## 2022-09-27 ENCOUNTER — Other Ambulatory Visit: Payer: Self-pay

## 2022-09-30 ENCOUNTER — Other Ambulatory Visit: Payer: Self-pay

## 2022-09-30 ENCOUNTER — Other Ambulatory Visit (HOSPITAL_COMMUNITY): Payer: Self-pay

## 2022-10-01 ENCOUNTER — Other Ambulatory Visit (HOSPITAL_COMMUNITY): Payer: Self-pay

## 2022-10-02 ENCOUNTER — Other Ambulatory Visit (HOSPITAL_COMMUNITY): Payer: Self-pay

## 2022-10-02 ENCOUNTER — Other Ambulatory Visit: Payer: Self-pay

## 2022-10-03 ENCOUNTER — Encounter: Payer: 59 | Attending: Physician Assistant | Admitting: Dietician

## 2022-10-03 ENCOUNTER — Encounter: Payer: Self-pay | Admitting: Dietician

## 2022-10-03 VITALS — Wt 185.5 lb

## 2022-10-03 DIAGNOSIS — Z7984 Long term (current) use of oral hypoglycemic drugs: Secondary | ICD-10-CM | POA: Diagnosis not present

## 2022-10-03 DIAGNOSIS — E119 Type 2 diabetes mellitus without complications: Secondary | ICD-10-CM | POA: Diagnosis not present

## 2022-10-03 DIAGNOSIS — Z713 Dietary counseling and surveillance: Secondary | ICD-10-CM | POA: Diagnosis not present

## 2022-10-03 DIAGNOSIS — Z794 Long term (current) use of insulin: Secondary | ICD-10-CM | POA: Diagnosis not present

## 2022-10-03 NOTE — Patient Instructions (Addendum)
Goals Established: Goal: do one of the exercise DVDs or youtube beginner chair workout in the morning 3 times per week.   Goal: start tracking water intake, aim for 64 oz per day.   At meals, aim to include 1/2 plate non-starchy vegetables, 1/4 plate protein, and 1/4 plate complex carbs.   Recipe Website: [email or web address]

## 2022-10-03 NOTE — Progress Notes (Signed)
Diabetes Self-Management Education  Visit Type: First/Initial  Appt. Start Time: 1400 Appt. End Time: 1500  10/03/2022  [name], identified by name and date of birth, is a 72 y.o. female with a diagnosis of Diabetes: Type 2.   ASSESSMENT  History includes: anemia, anxiety, arthritis, cancer, depression, type 2 diabetes, GERD, HLD, HTN, thyroid disease Labs noted: 07/08/22 A1c 10.9 Medications include: jardiance, glipizide, lantus, metformin Supplements:  Pt states her doctor wanted her to lose weight but she has found this to be hard.   Pt states she used to be on insulin but hasn't been for years and recently started insulin again so this feels new to her. Pt states she is on 42 units lantus right now but working up to 44 units. Pt states her doctor wants her to be in the 140's blood glucose range.   Pt states she checks her blood glucose regularly and reports some fasting blood glucose of 164, 159, 131.   Pt states she got a balance ball and has been sitting on it and reports she has a dvd for chair yoga and stretches.   Pt states she eats and take turns cooking with her neighbor sometimes. They also sit outside in the evening and socialize.   Pt states her blood glucose dropped form 267 to 115 in a few hours and had low symptoms so she drank a mini pepsi.   Pt states she was having issues with eating a few weeks ago so she has been eating softer foods.   Pt states she used to drink 1/2 to 1 case diet pepsi per day. She states she has been trying to work on this the last few weeks.  Weight 185 lb 8 oz (84.1 kg). Body mass index is 31.84 kg/m.   Diabetes Self-Management Education - 10/03/22 1402       Visit Information   Visit Type First/Initial      Initial Visit   Diabetes Type Type 2    Date Diagnosed 1999    Are you currently following a meal plan? No    Are you taking your medications as prescribed? Yes      Health Coping   How would you rate your  overall health? Fair      Psychosocial Assessment   Patient Belief/Attitude about Diabetes Other (comment)    What is the hardest part about your diabetes right now, causing you the most concern, or is the most worrisome to you about your diabetes?   Making healty food and beverage choices    Self-care barriers None    Self-management support Doctor's office    Other persons present Patient    Patient Concerns Nutrition/Meal planning    Special Needs None    Preferred Learning Style No preference indicated    Learning Readiness Ready    How often do you need to have someone help you when you read instructions, pamphlets, or other written materials from your doctor or pharmacy? 1 - Never    What is the last grade level you completed in school? 12      Pre-Education Assessment   Patient understands the diabetes disease and treatment process. Needs Instruction    Patient understands incorporating nutritional management into lifestyle. Needs Instruction    Patient undertands incorporating physical activity into lifestyle. Needs Instruction    Patient understands using medications safely. Needs Instruction    Patient understands monitoring blood glucose, interpreting and using results Needs Instruction    Patient  understands prevention, detection, and treatment of acute complications. Needs Instruction    Patient understands prevention, detection, and treatment of chronic complications. Needs Instruction    Patient understands how to develop strategies to address psychosocial issues. Needs Instruction    Patient understands how to develop strategies to promote health/change behavior. Needs Instruction      Complications   Last HgB A1C per patient/outside source 10.9 %    How often do you check your blood sugar? 1-2 times/day    Fasting Blood glucose range (mg/dL) 956-213    Postprandial Blood glucose range (mg/dL) 086-578;>469    Have you had a dilated eye exam in the past 12 months? Yes     Have you had a dental exam in the past 12 months? Yes    Are you checking your feet? No      Dietary Intake   Breakfast english muffin with smart balance    Snack (morning) none    Lunch chicken deli meat sandwich on potato bread OR fried bologna sandwich    Snack (afternoon) oatmeal cream pie OR fig newton    Dinner pasta with meat and mushrooms    Beverage(s) 2-3 hot tea with milk and coconut sugar, 3-4 diet pepsi, 16 oz water      Activity / Exercise   Activity / Exercise Type ADL's    How many days per week do you exercise? 0    How many minutes per day do you exercise? 0    Total minutes per week of exercise 0      Patient Education   Previous Diabetes Education Yes (please comment)    Disease Pathophysiology Definition of diabetes, type 1 and 2, and the diagnosis of diabetes;Explored patient's options for treatment of their diabetes    Healthy Eating Role of diet in the treatment of diabetes and the relationship between the three main macronutrients and blood glucose level;Plate Method;Meal timing in regards to the patients' current diabetes medication.;Reviewed blood glucose goals for pre and post meals and how to evaluate the patients' food intake on their blood glucose level.;Meal options for control of blood glucose level and chronic complications.    Being Active Role of exercise on diabetes management, blood pressure control and cardiac health.    Medications Taught/reviewed insulin/injectables, injection, site rotation, insulin/injectables storage and needle disposal.    Monitoring Identified appropriate SMBG and/or A1C goals.    Acute complications Taught prevention, symptoms, and  treatment of hypoglycemia - the 15 rule.    Chronic complications Identified and discussed with patient  current chronic complications    Diabetes Stress and Support Worked with patient to identify barriers to care and solutions    Lifestyle and Health Coping Lifestyle issues that need to be  addressed for better diabetes care      Individualized Goals (developed by patient)   Nutrition General guidelines for healthy choices and portions discussed;Follow meal plan discussed    Physical Activity Exercise 3-5 times per week;15 minutes per day;30 minutes per day    Medications take my medication as prescribed    Monitoring  Test my blood glucose as discussed    Problem Solving Eating Pattern    Reducing Risk examine blood glucose patterns;do foot checks daily;treat hypoglycemia with 15 grams of carbs if blood glucose less than 70mg /dL    Health Coping Ask for help with psychological, social, or emotional issues      Post-Education Assessment   Patient understands the diabetes disease and treatment process.  Comprehends key points    Patient understands incorporating nutritional management into lifestyle. Comprehends key points    Patient undertands incorporating physical activity into lifestyle. Comprehends key points    Patient understands using medications safely. Comphrehends key points    Patient understands monitoring blood glucose, interpreting and using results Comprehends key points    Patient understands prevention, detection, and treatment of acute complications. Comprehends key points    Patient understands prevention, detection, and treatment of chronic complications. Needs Review    Patient understands how to develop strategies to address psychosocial issues. Demonstrates understanding / competency    Patient understands how to develop strategies to promote health/change behavior. Comprehends key points      Outcomes   Expected Outcomes Demonstrated interest in learning. Expect positive outcomes    Future DMSE 2 months    Program Status Not Completed             Individualized Plan for Diabetes Self-Management Training:   Learning Objective:  Patient will have a greater understanding of diabetes self-management. Patient education plan is to attend individual  and/or group sessions per assessed needs and concerns.   Plan:   Patient Instructions  Goals Established: Goal: do one of the exercise DVDs or youtube beginner chair workout in the morning 3 times per week.   Goal: start tracking water intake, aim for 64 oz per day.   At meals, aim to include 1/2 plate non-starchy vegetables, 1/4 plate protein, and 1/4 plate complex carbs.   Recipe Website: [email or web address]  Expected Outcomes:  Demonstrated interest in learning. Expect positive outcomes  Education material provided: ADA - How to Thrive: A Guide for Your Journey with Diabetes and Meal plan card, Food Assistance Resources  If problems or questions, patient to contact team via:  Phone  Future DSME appointment: 2 months

## 2022-10-04 ENCOUNTER — Other Ambulatory Visit: Payer: Self-pay | Admitting: Physician Assistant

## 2022-10-04 ENCOUNTER — Other Ambulatory Visit: Payer: Self-pay

## 2022-10-04 ENCOUNTER — Other Ambulatory Visit (HOSPITAL_COMMUNITY): Payer: Self-pay

## 2022-10-04 MED ORDER — INSULIN GLARGINE 100 UNIT/ML SOLOSTAR PEN
PEN_INJECTOR | SUBCUTANEOUS | 11 refills | Status: DC
  Filled 2022-10-04: qty 15, 25d supply, fill #0
  Filled 2022-10-22: qty 15, 25d supply, fill #1

## 2022-10-07 ENCOUNTER — Encounter: Payer: Self-pay | Admitting: Oncology

## 2022-10-07 ENCOUNTER — Other Ambulatory Visit: Payer: Self-pay

## 2022-10-09 ENCOUNTER — Other Ambulatory Visit: Payer: Self-pay

## 2022-10-14 ENCOUNTER — Ambulatory Visit: Payer: Self-pay

## 2022-10-14 NOTE — Patient Outreach (Signed)
Care Coordination   10/14/2022 Name: Lucianne Kotlowski MRN: [id number] DOB: 06/04/1950   Care Coordination Outreach Attempts:  An unsuccessful telephone outreach was attempted for a scheduled appointment today.  Follow Up Plan:  Additional outreach attempts will be made to offer the patient care coordination information and services.   Encounter Outcome:  No Answer   Care Coordination Interventions:  No, not indicated      Bary Leriche, RN, MSN [facility]  [facility], [facility] Management Community Coordinator Direct Dial: [phone]  Fax: [phone] Website: [name]

## 2022-10-16 ENCOUNTER — Telehealth: Payer: Self-pay

## 2022-10-16 NOTE — Patient Outreach (Signed)
Care Coordination   Follow Up Visit Note   10/16/2022 Name: Felicia [name] MRN: [id number] DOB: 1950/11/20  Felicia [name] is a 72 y.o. year old female who sees Allwardt, Crist Infante, PA-C for primary care. I spoke with  Felicia [name] by phone today.  What matters to the patients health and wellness today?  Maintain health    Goals Addressed             This Visit's Progress    Diabetes Management       Patient Goals/Self Care Activities: -Patient/Caregiver will self-administer medications as prescribed as evidenced by self-report/primary caregiver report  -Patient/Caregiver will attend all scheduled provider appointments as evidenced by clinician review of documented attendance to scheduled appointments and patient/caregiver report -Patient/Caregiver will call provider office for new concerns or questions as evidenced by review of documented incoming telephone call notes and patient report  -check blood sugar at prescribed times -check blood sugar if I feel it is too high or too low -record values and write them down take them to all doctor visits    Patient reports she is doing okay.  She reports some issues with inspection at her apartment.  She is working on those.  Blood sugar doing well. Last check 112.  Reiterated diabetes management.  No concerns.          SDOH assessments and interventions completed:  Yes     Care Coordination Interventions:  Yes, provided   Follow up plan: Follow up call scheduled for November    Encounter Outcome:  Patient Visit Completed   Felicia Leriche, RN, MSN Felicia [name]  [facility], [facility] Management Community Coordinator Direct Dial: [phone]  Fax: [phone] Website: [name]

## 2022-10-16 NOTE — Patient Instructions (Signed)
Visit Information  Thank you for taking time to visit with me today. Please don't hesitate to contact me if I can be of assistance to you.   Following are the goals we discussed today:   Goals Addressed             This Visit's Progress    Diabetes Management       Patient Goals/Self Care Activities: -Patient/Caregiver will self-administer medications as prescribed as evidenced by self-report/primary caregiver report  -Patient/Caregiver will attend all scheduled provider appointments as evidenced by clinician review of documented attendance to scheduled appointments and patient/caregiver report -Patient/Caregiver will call provider office for new concerns or questions as evidenced by review of documented incoming telephone call notes and patient report  -check blood sugar at prescribed times -check blood sugar if I feel it is too high or too low -record values and write them down take them to all doctor visits    Patient reports she is doing okay.  She reports some issues with inspection at her apartment.  She is working on those.  Blood sugar doing well. Last check 112.  Reiterated diabetes management.  No concerns.          Our next appointment is by telephone on 11/19/22 at 1200 pm   Please call the care guide team at [phone] if you need to cancel or reschedule your appointment.   If you are experiencing a Mental Health or Behavioral Health Crisis or need someone to talk to, please call the Suicide and Crisis Lifeline: 988   Patient verbalizes understanding of instructions and care plan provided today and agrees to view in MyChart. Active MyChart status and patient understanding of how to access instructions and care plan via MyChart confirmed with patient.     The patient has been provided with contact information for the care management team and has been advised to call with any health related questions or concerns.   Bary Leriche, RN, MSN [facility], [facility] Management Community Coordinator Direct Dial: [phone]  Fax: [phone] Website: [email or web address]

## 2022-10-18 ENCOUNTER — Other Ambulatory Visit: Payer: Self-pay

## 2022-10-21 ENCOUNTER — Telehealth: Payer: Self-pay | Admitting: *Deleted

## 2022-10-21 NOTE — Telephone Encounter (Signed)
Telephone encounter was:  Successful.  10/21/2022 Name: Felicia [name] MRN: [id number] DOB: 25-Sep-1950  Felicia [name] is a 72 y.o. year old female who is a primary care patient of Allwardt, Crist Infante, PA-C . The community resource team was consulted for assistance with Transportation Needs  mailing patient TAMS application for transportation in the future  Care guide performed the following interventions: Follow up call placed to the patient to discuss status of referral.  Follow Up Plan:  No further follow up planned at this time. The patient has been provided with needed resources. [name] [facility] Health  Population Health Careguide  Direct Dial: [phone] Website: [name]

## 2022-10-22 ENCOUNTER — Encounter: Payer: Self-pay | Admitting: Oncology

## 2022-10-22 ENCOUNTER — Other Ambulatory Visit (HOSPITAL_COMMUNITY): Payer: Self-pay

## 2022-10-22 ENCOUNTER — Other Ambulatory Visit: Payer: Self-pay | Admitting: Family Medicine

## 2022-10-22 ENCOUNTER — Other Ambulatory Visit: Payer: Self-pay | Admitting: Physician Assistant

## 2022-10-22 ENCOUNTER — Other Ambulatory Visit: Payer: Self-pay

## 2022-10-22 MED ORDER — GLIPIZIDE ER 10 MG PO TB24
20.0000 mg | ORAL_TABLET | Freq: Every morning | ORAL | 1 refills | Status: DC
  Filled 2022-10-22: qty 60, 30d supply, fill #0
  Filled 2022-11-25: qty 60, 30d supply, fill #1
  Filled 2022-12-25: qty 60, 30d supply, fill #2

## 2022-10-22 MED ORDER — TIZANIDINE HCL 2 MG PO TABS
2.0000 mg | ORAL_TABLET | Freq: Every evening | ORAL | 0 refills | Status: DC | PRN
  Filled 2022-10-22: qty 60, 30d supply, fill #0

## 2022-10-22 MED FILL — Loratadine Tab 10 MG: ORAL | 30 days supply | Qty: 30 | Fill #1 | Status: AC

## 2022-10-22 MED FILL — Loratadine Tab 10 MG: ORAL | 30 days supply | Qty: 30 | Fill #1 | Status: CN

## 2022-10-22 NOTE — Telephone Encounter (Signed)
Rx refill request approved per Dr. Corey's orders. 

## 2022-10-23 ENCOUNTER — Other Ambulatory Visit: Payer: Self-pay

## 2022-10-23 ENCOUNTER — Encounter: Payer: Self-pay | Admitting: Physician Assistant

## 2022-10-23 ENCOUNTER — Ambulatory Visit (INDEPENDENT_AMBULATORY_CARE_PROVIDER_SITE_OTHER): Payer: 59 | Admitting: Physician Assistant

## 2022-10-23 ENCOUNTER — Other Ambulatory Visit (HOSPITAL_COMMUNITY): Payer: Self-pay

## 2022-10-23 VITALS — BP 142/76 | HR 62 | Temp 97.7°F | Ht 64.0 in | Wt 182.2 lb

## 2022-10-23 DIAGNOSIS — R159 Full incontinence of feces: Secondary | ICD-10-CM

## 2022-10-23 DIAGNOSIS — R197 Diarrhea, unspecified: Secondary | ICD-10-CM

## 2022-10-23 DIAGNOSIS — E119 Type 2 diabetes mellitus without complications: Secondary | ICD-10-CM

## 2022-10-23 DIAGNOSIS — Z7984 Long term (current) use of oral hypoglycemic drugs: Secondary | ICD-10-CM | POA: Diagnosis not present

## 2022-10-23 DIAGNOSIS — R152 Fecal urgency: Secondary | ICD-10-CM

## 2022-10-23 DIAGNOSIS — M25562 Pain in left knee: Secondary | ICD-10-CM | POA: Diagnosis not present

## 2022-10-23 DIAGNOSIS — Z23 Encounter for immunization: Secondary | ICD-10-CM

## 2022-10-23 DIAGNOSIS — R933 Abnormal findings on diagnostic imaging of other parts of digestive tract: Secondary | ICD-10-CM | POA: Diagnosis not present

## 2022-10-23 LAB — POCT GLYCOSYLATED HEMOGLOBIN (HGB A1C): Hemoglobin A1C: 8.3 % — AB (ref 4.0–5.6)

## 2022-10-23 MED ORDER — BLOOD GLUCOSE MONITOR KIT
PACK | 2 refills | Status: DC
Start: 2022-10-23 — End: 2022-12-31
  Filled 2022-10-23: qty 1, fill #0
  Filled 2022-10-24: qty 1, 1d supply, fill #0

## 2022-10-23 NOTE — Progress Notes (Unsigned)
Subjective:    Patient ID: Felicia [name], female    DOB: 05-30-1950, 72 y.o.   MRN: [id number]  Chief Complaint  Patient presents with   Medical Management of Chronic Issues    Pt in office for 3 mon f/u w/ PCP; pt states not being doing well mentally, had recent apartment inspection and has added stress to her already mental state; pt also brought in sugar readings; and wants to discuss; pt is fasting incase labs are needed;     HPI Patient is in today for ***  Past Medical History:  Diagnosis Date   Anemia    Anxiety    Breast cancer (HCC) 09/2019   left breast IMC   CAD (coronary artery disease)    a. 3/2007s/p DES to the LAD [facility]); b. 01/2017 MV: EF 80%, small, mild apical ant defect w/o ischemia (felt to be breast atten). Low risk.   Depression    DM (diabetes mellitus) (HCC)    Essential hypertension    GERD (gastroesophageal reflux disease)    Headache    secondary to neck surgery per patient   High triglycerides    Hyperlipidemia    Hypothyroid    OA (osteoarthritis)    right knee,hands   Overflow incontinence    PONV (postoperative nausea and vomiting)    s/p gallbladder    RLS (restless legs syndrome)    Urinary urgency    Uterine cancer (HCC) dx'd 2014    Past Surgical History:  Procedure Laterality Date   BREAST LUMPECTOMY WITH RADIOACTIVE SEED AND SENTINEL LYMPH NODE BIOPSY Left 10/07/2019   Procedure: LEFT BREAST LUMPECTOMY WITH RADIOACTIVE SEED AND SENTINEL LYMPH NODE BIOPSY;  Surgeon: Emelia Loron, MD;  Location: [facility];  Service: General;  Laterality: Left;   CORONARY STENT PLACEMENT     CORONARY/GRAFT ACUTE MI REVASCULARIZATION     FINGER ARTHRODESIS Right 06/11/2019   Procedure: ARTHRODESIS INDEX FINGER DISTAL PHALANGEAL JOINT;  Surgeon: Betha Loa, MD;  Location: [facility];  Service: Orthopedics;  Laterality: Right;   FOOT SURGERY Left    LEFT HEART CATH AND CORONARY ANGIOGRAPHY N/A 02/16/2018    Procedure: LEFT HEART CATH AND CORONARY ANGIOGRAPHY;  Surgeon: Runell Gess, MD;  Location: [facility];  Service: Cardiovascular;  Laterality: N/A;   LEFT HEART CATH AND CORONARY ANGIOGRAPHY N/A 05/01/2020   Procedure: LEFT HEART CATH AND CORONARY ANGIOGRAPHY;  Surgeon: Runell Gess, MD;  Location: [facility];  Service: Cardiovascular;  Laterality: N/A;   PORTACATH PLACEMENT Right 11/23/2019   Procedure: INSERTION PORT-A-CATH WITH ULTRASOUND GUIDANCE;  Surgeon: Emelia Loron, MD;  Location: [facility];  Service: General;  Laterality: Right;   REPLACEMENT TOTAL KNEE Right    SPINE SURGERY     TOTAL ABDOMINAL HYSTERECTOMY     FOR UTERUS CANCER   TOTAL HIP ARTHROPLASTY Left 2015    Family History  Problem Relation Age of Onset   AAA (abdominal aortic aneurysm) Mother    Diabetes Father    Alzheimer's disease Father    Throat cancer Sister    Breast cancer Cousin     Social History   Tobacco Use   Smoking status: Never   Smokeless tobacco: Never  Vaping Use   Vaping status: Never Used  Substance Use Topics   Alcohol use: Yes    Comment: OCCASIONALLY   Drug use: No     Allergies  Allergen Reactions   Chloraprep One Step [Chlorhexidine  Gluconate] Rash   Estrogens Other (See Comments)    PATIENT HAS A HISTORY OF CANCER AND HAS BEEN TOLD TO NEVER TAKE ANYTHING CONTAINING ESTROGEN, AS IT MIGHT CAUSE A RECURRENCE   Shellfish-Derived Products Nausea And Vomiting   Shrimp [Shellfish Allergy] Nausea And Vomiting    Review of Systems NEGATIVE UNLESS OTHERWISE INDICATED IN HPI      Objective:     BP (!) 142/76 (BP Location: Left Arm)   Pulse 62   Temp 97.7 F (36.5 C) (Temporal)   Ht 5\' 4"  (1.626 m)   Wt 182 lb 3.2 oz (82.6 kg)   SpO2 97%   BMI 31.27 kg/m   Wt Readings from Last 3 Encounters:  10/23/22 182 lb 3.2 oz (82.6 kg)  10/03/22 185 lb 8 oz (84.1 kg)  09/25/22 180 lb (81.6 kg)    BP Readings from Last 3  Encounters:  10/23/22 (!) 142/76  09/25/22 (!) 111/59  08/14/22 127/75     Physical Exam     Assessment & Plan:  Immunization due -     Flu Vaccine Trivalent High Dose (Fluad)        No follow-ups on file.  This note was prepared with assistance of Conservation officer, historic buildings. Occasional wrong-word or sound-a-like substitutions may have occurred due to the inherent limitations of voice recognition software.  Time Spent: *** minutes of total time was spent on the date of the encounter performing the following actions: chart review prior to seeing the patient, obtaining history, performing a medically necessary exam, counseling on the treatment plan, placing orders, and documenting in our EHR.       Abby Tucholski M Marigrace Mccole, PA-C

## 2022-10-23 NOTE — Patient Instructions (Addendum)
STOP Rosuvastatin (Crestor), as this may be contributing to muscle aches and pain.   REDUCE Metformin to 500 mg twice daily; consider stopping it altogether if diarrhea ist still persisting.  Referral to GI for discussion about diarrhea and colonoscopy update.   Flu shot was updated today.  A1c looked much better today! Keep up the good work!   Sending new meter and kit to your pharmacy for sugar checks.

## 2022-10-24 ENCOUNTER — Other Ambulatory Visit (HOSPITAL_COMMUNITY): Payer: Self-pay

## 2022-10-24 ENCOUNTER — Other Ambulatory Visit: Payer: Self-pay | Admitting: Physician Assistant

## 2022-10-24 ENCOUNTER — Other Ambulatory Visit: Payer: Self-pay

## 2022-10-24 DIAGNOSIS — E119 Type 2 diabetes mellitus without complications: Secondary | ICD-10-CM

## 2022-10-24 MED ORDER — LANCETS MISC
3 refills | Status: AC
Start: 2022-10-24 — End: ?
  Filled 2022-10-24: qty 100, 25d supply, fill #0
  Filled 2022-11-11 – 2022-11-20 (×2): qty 100, 25d supply, fill #1
  Filled 2022-12-09 – 2022-12-10 (×4): qty 100, 25d supply, fill #2
  Filled 2023-01-02 (×2): qty 100, 25d supply, fill #3

## 2022-10-24 MED ORDER — GLUCOSE BLOOD VI STRP
ORAL_STRIP | 3 refills | Status: AC
Start: 2022-10-24 — End: ?
  Filled 2022-10-24: qty 100, 25d supply, fill #0
  Filled 2022-11-11: qty 100, 25d supply, fill #1
  Filled 2022-12-09 – 2022-12-10 (×3): qty 100, 25d supply, fill #2
  Filled 2023-01-02: qty 100, 25d supply, fill #3

## 2022-10-30 ENCOUNTER — Encounter: Payer: Self-pay | Admitting: Physician Assistant

## 2022-10-30 ENCOUNTER — Other Ambulatory Visit (HOSPITAL_COMMUNITY): Payer: Self-pay

## 2022-11-11 ENCOUNTER — Other Ambulatory Visit (HOSPITAL_COMMUNITY): Payer: Self-pay

## 2022-11-11 ENCOUNTER — Other Ambulatory Visit: Payer: Self-pay | Admitting: Physician Assistant

## 2022-11-11 ENCOUNTER — Encounter: Payer: Self-pay | Admitting: Physician Assistant

## 2022-11-12 ENCOUNTER — Other Ambulatory Visit: Payer: Self-pay

## 2022-11-12 ENCOUNTER — Other Ambulatory Visit (HOSPITAL_COMMUNITY): Payer: Self-pay

## 2022-11-12 MED ORDER — LANTUS SOLOSTAR 100 UNIT/ML ~~LOC~~ SOPN
PEN_INJECTOR | SUBCUTANEOUS | 11 refills | Status: DC
  Filled 2022-11-12: qty 15, 28d supply, fill #0
  Filled 2022-12-17: qty 15, 28d supply, fill #1
  Filled 2023-01-20: qty 15, 28d supply, fill #2
  Filled 2023-02-26: qty 15, 28d supply, fill #3
  Filled 2023-04-03: qty 15, 28d supply, fill #4
  Filled 2023-04-16 – 2023-04-30 (×4): qty 15, 28d supply, fill #5
  Filled 2023-06-11: qty 15, 28d supply, fill #6
  Filled 2023-07-12: qty 15, 28d supply, fill #7
  Filled 2023-08-16: qty 15, 28d supply, fill #8
  Filled 2023-09-13: qty 15, 28d supply, fill #9
  Filled 2023-10-10: qty 15, 28d supply, fill #10
  Filled 2023-11-11: qty 15, 28d supply, fill #11

## 2022-11-14 ENCOUNTER — Ambulatory Visit: Payer: 59 | Attending: Cardiovascular Disease

## 2022-11-14 DIAGNOSIS — E782 Mixed hyperlipidemia: Secondary | ICD-10-CM | POA: Diagnosis not present

## 2022-11-14 DIAGNOSIS — E785 Hyperlipidemia, unspecified: Secondary | ICD-10-CM | POA: Diagnosis not present

## 2022-11-14 DIAGNOSIS — Z79899 Other long term (current) drug therapy: Secondary | ICD-10-CM | POA: Diagnosis not present

## 2022-11-15 ENCOUNTER — Telehealth: Payer: Self-pay

## 2022-11-15 DIAGNOSIS — E785 Hyperlipidemia, unspecified: Secondary | ICD-10-CM

## 2022-11-15 LAB — LIPID PANEL
Chol/HDL Ratio: 6.1 ratio — ABNORMAL HIGH (ref 0.0–4.4)
Cholesterol, Total: 281 mg/dL — ABNORMAL HIGH (ref 100–199)
HDL: 46 mg/dL (ref >39)
LDL Chol Calc (NIH): 161 mg/dL — ABNORMAL HIGH (ref 0–99)
Triglycerides: 389 mg/dL — ABNORMAL HIGH (ref 0–149)
VLDL Cholesterol Cal: 74 mg/dL — ABNORMAL HIGH (ref 5–40)

## 2022-11-15 LAB — ALT: ALT: 21 [IU]/L (ref 0–32)

## 2022-11-15 NOTE — Telephone Encounter (Signed)
-----   Message from Kristeen Miss sent at 11/15/2022  7:54 AM EDT ----- Felicia [name] are better than last year but are still very elevated LDL goal is <70.   Her LDL is 161 Please refer to Dr. Rennis Golden in the Advanced Lipid clinic

## 2022-11-15 NOTE — Telephone Encounter (Signed)
Spoke with patient concerning results and referral to lipid clinic, patient agrees with plan, no questions at this time

## 2022-11-19 ENCOUNTER — Ambulatory Visit: Payer: Self-pay

## 2022-11-19 DIAGNOSIS — M2669 Other specified disorders of temporomandibular joint: Secondary | ICD-10-CM | POA: Diagnosis not present

## 2022-11-19 DIAGNOSIS — H9202 Otalgia, left ear: Secondary | ICD-10-CM | POA: Diagnosis not present

## 2022-11-19 DIAGNOSIS — K148 Other diseases of tongue: Secondary | ICD-10-CM | POA: Diagnosis not present

## 2022-11-19 NOTE — Patient Outreach (Signed)
  Care Coordination   Follow Up Visit Note   11/19/2022 Name: Felicia [name] MRN: [id number] DOB: 10-26-1950  Felicia [name] is a 72 y.o. year old female who sees Allwardt, Crist Infante, PA-C for primary care. I spoke with  Felicia [name] by phone today.  What matters to the patients health and wellness today?  Diabetes management    Goals Addressed             This Visit's Progress    Diabetes Management       Patient Goals/Self Care Activities: -Patient/Caregiver will self-administer medications as prescribed as evidenced by self-report/primary caregiver report  -Patient/Caregiver will attend all scheduled provider appointments as evidenced by clinician review of documented attendance to scheduled appointments and patient/caregiver report -Patient/Caregiver will call provider office for new concerns or questions as evidenced by review of documented incoming telephone call notes and patient report  -check blood sugar at prescribed times -check blood sugar if I feel it is too high or too low -record values and write them down take them to all doctor visits    Patient reports she is doing pretty good.   Blood sugar up some due to eating a doughnut yesterday.  Last check 152.  Discussed diabetes management.  No concerns.          SDOH assessments and interventions completed:  Yes  SDOH Interventions Today    Flowsheet Row Most Recent Value  SDOH Interventions   Health Literacy Interventions Intervention Not Indicated        Care Coordination Interventions:  Yes, provided   Follow up plan: Follow up call scheduled for December    Encounter Outcome:  Patient Visit Completed   Bary Leriche, RN, MSN [place]  [facility], [facility] Management Community Coordinator Direct Dial: [phone]  Fax: [phone] Website: [name]

## 2022-11-19 NOTE — Patient Instructions (Signed)
Visit Information  Thank you for taking time to visit with me today. Please don't hesitate to contact me if I can be of assistance to you.   Following are the goals we discussed today:   Goals Addressed             This Visit's Progress    Diabetes Management       Patient Goals/Self Care Activities: -Patient/Caregiver will self-administer medications as prescribed as evidenced by self-report/primary caregiver report  -Patient/Caregiver will attend all scheduled provider appointments as evidenced by clinician review of documented attendance to scheduled appointments and patient/caregiver report -Patient/Caregiver will call provider office for new concerns or questions as evidenced by review of documented incoming telephone call notes and patient report  -check blood sugar at prescribed times -check blood sugar if I feel it is too high or too low -record values and write them down take them to all doctor visits    Patient reports she is doing pretty good.   Blood sugar up some due to eating a doughnut yesterday.  Last check 152.  Discussed diabetes management.  No concerns.          Our next appointment is by telephone on 12/24/22 at 1200 pm  Please call the care guide team at [phone] if you need to cancel or reschedule your appointment.   If you are experiencing a Mental Health or Behavioral Health Crisis or need someone to talk to, please call the Suicide and Crisis Lifeline: 988   Patient verbalizes understanding of instructions and care plan provided today and agrees to view in MyChart. Active MyChart status and patient understanding of how to access instructions and care plan via MyChart confirmed with patient.     The patient has been provided with contact information for the care management team and has been advised to call with any health related questions or concerns.   Bary Leriche, RN, MSN [facility], [facility]  Management Community Coordinator Direct Dial: [phone]  Fax: [phone] Website: [name]

## 2022-11-20 ENCOUNTER — Other Ambulatory Visit (HOSPITAL_COMMUNITY): Payer: Self-pay

## 2022-11-20 ENCOUNTER — Ambulatory Visit: Payer: 59

## 2022-11-20 ENCOUNTER — Ambulatory Visit: Payer: 59 | Admitting: Family Medicine

## 2022-11-20 ENCOUNTER — Other Ambulatory Visit: Payer: Self-pay

## 2022-11-20 ENCOUNTER — Encounter: Payer: Self-pay | Admitting: Family Medicine

## 2022-11-20 VITALS — BP 136/82 | HR 73 | Ht 64.0 in | Wt 183.0 lb

## 2022-11-20 DIAGNOSIS — M25562 Pain in left knee: Secondary | ICD-10-CM | POA: Diagnosis not present

## 2022-11-20 DIAGNOSIS — M1712 Unilateral primary osteoarthritis, left knee: Secondary | ICD-10-CM | POA: Diagnosis not present

## 2022-11-20 DIAGNOSIS — M25462 Effusion, left knee: Secondary | ICD-10-CM | POA: Diagnosis not present

## 2022-11-20 MED ORDER — TIZANIDINE HCL 2 MG PO TABS
2.0000 mg | ORAL_TABLET | Freq: Every evening | ORAL | 12 refills | Status: DC | PRN
  Filled 2022-11-20: qty 60, 30d supply, fill #0

## 2022-11-20 MED ORDER — HYDROCODONE-ACETAMINOPHEN 5-325 MG PO TABS
1.0000 | ORAL_TABLET | Freq: Four times a day (QID) | ORAL | 0 refills | Status: DC | PRN
  Filled 2022-11-20: qty 15, 4d supply, fill #0

## 2022-11-20 NOTE — Progress Notes (Signed)
I, Stevenson Clinch, CMA acting as a scribe for Clementeen Graham, MD.  Felicia [name] is a 72 y.o. female who presents to [facility] at [facility] today for f/u neck and R lower leg pain. Pt was last seen by Dr. Denyse Amass on 06/20/22 and was prescribed tizanidine, advised to use a heating pad, and was referred to PT.  Today, pt reports reports worsening left knee pain over the past 3-4 days, yesterday pain was 10/10. Locates pain to medial aspect of the knee. Also notes pain in B LE. Visible swelling present. Denies visible swelling. Ambulating with a cane today. Taking Gabapentin, Tramadol, and Tylenol arthritis with minimal relief. Notes being on her feet more over the past week and pushing someone in a wheel chair this past Saturday.   Dx imaging: 06/20/22 R tib/fib XR 03/13/22 C-spine XR   Pertinent review of systems: No fevers or chills  Relevant historical information: Diabetes   Exam:  BP 136/82   Pulse 73   Ht 5\' 4"  (1.626 m)   Wt 183 lb (83 kg)   SpO2 98%   BMI 31.41 kg/m  General: Well Developed, well nourished, and in no acute distress.   MSK: Knee large joint effusion is present. Decreased range of motion lacks full extension and flexion. Tender palpation medial joint line. Intact strength to flexion and extension although this does reproduce pain. Guarding with ligament exam testing.    Lab and Radiology Results  Procedure: Real-time Ultrasound Guided Injection of left knee joint superior lateral patella space Device: Philips Affiniti 50G/GE Logiq Images permanently stored and available for review in PACS Ultrasound evaluation prior to injection reveals a large joint effusion and a small Baker's cyst. Verbal informed consent obtained.  Discussed risks and benefits of procedure. Warned about infection, bleeding, hyperglycemia damage to structures among others. Patient expresses understanding and agreement Time-out conducted.   Noted no overlying erythema,  induration, or other signs of local infection.   Skin prepped in a sterile fashion.   Local anesthesia: Topical Ethyl chloride.   With sterile technique and under real time ultrasound guidance: 40 mg of Kenalog and 2 mL of Marcaine injected into knee joint. Fluid seen entering the joint capsule.   Completed without difficulty   Pain immediately resolved suggesting accurate placement of the medication.   Advised to call if fevers/chills, erythema, induration, drainage, or persistent bleeding.   Images permanently stored and available for review in the ultrasound unit.  Impression: Technically successful ultrasound guided injection.   X-ray images left knee obtained today personally and independently interpreted. Moderate medial compartment DJD.  No acute fractures are visible. Await formal radiology review     Assessment and Plan: 72 y.o. female with left knee pain.  Patient has significant worsening pain with a knee joint effusion.  This is concerning for exacerbation of DJD or even a subchondral fracture.  Plan for steroid injection today and reassess in about 2 weeks.  Tizanidine and hydrocodone prescribed.   PDMP reviewed during this encounter. Orders Placed This Encounter  Procedures   DG Knee AP/LAT W/Sunrise Left    Standing Status:   Future    Number of Occurrences:   1    Standing Expiration Date:   12/20/2022    Order Specific Question:   Reason for Exam (SYMPTOM  OR DIAGNOSIS REQUIRED)    Answer:   left knee pain    Order Specific Question:   Preferred imaging location?    Answer:   Adult nurse  [place]   [place] LIMITED JOINT SPACE STRUCTURES LOW LEFT(NO LINKED CHARGES)    Order Specific Question:   Reason for Exam (SYMPTOM  OR DIAGNOSIS REQUIRED)    Answer:   left knee pain    Order Specific Question:   Preferred imaging location?    Answer:   [facility] ordered this encounter  Medications   tiZANidine (ZANAFLEX) 2 MG tablet    Sig:  Take 1-2 tablets (2-4 mg total) by mouth at bedtime as needed.    Dispense:  60 tablet    Refill:  12   HYDROcodone-acetaminophen (NORCO/VICODIN) 5-325 MG tablet    Sig: Take 1 tablet by mouth every 6 (six) hours as needed.    Dispense:  15 tablet    Refill:  0     Discussed warning signs or symptoms. Please see discharge instructions. Patient expresses understanding.   The above documentation has been reviewed and is accurate and complete Clementeen Graham, M.D.

## 2022-11-20 NOTE — Patient Instructions (Addendum)
Thank you for coming in today.   You received an injection today. Seek immediate medical attention if the joint becomes red, extremely painful, or is oozing fluid.   If this not working let me know.

## 2022-11-25 ENCOUNTER — Other Ambulatory Visit (HOSPITAL_COMMUNITY): Payer: Self-pay

## 2022-11-25 ENCOUNTER — Encounter: Payer: Self-pay | Admitting: Oncology

## 2022-11-25 ENCOUNTER — Other Ambulatory Visit: Payer: Self-pay

## 2022-11-25 MED FILL — Loratadine Tab 10 MG: ORAL | 30 days supply | Qty: 30 | Fill #2 | Status: AC

## 2022-12-08 ENCOUNTER — Encounter (HOSPITAL_COMMUNITY): Payer: Self-pay

## 2022-12-09 ENCOUNTER — Other Ambulatory Visit (HOSPITAL_COMMUNITY): Payer: Self-pay

## 2022-12-09 NOTE — Progress Notes (Signed)
Left knee x-ray shows mild arthritis.

## 2022-12-10 ENCOUNTER — Other Ambulatory Visit: Payer: Self-pay

## 2022-12-10 ENCOUNTER — Other Ambulatory Visit (HOSPITAL_COMMUNITY): Payer: Self-pay

## 2022-12-17 ENCOUNTER — Other Ambulatory Visit (HOSPITAL_COMMUNITY): Payer: Self-pay

## 2022-12-18 ENCOUNTER — Ambulatory Visit: Payer: 59 | Admitting: Physician Assistant

## 2022-12-18 ENCOUNTER — Other Ambulatory Visit: Payer: Self-pay

## 2022-12-18 ENCOUNTER — Encounter: Payer: Self-pay | Admitting: Physician Assistant

## 2022-12-18 VITALS — BP 124/70 | HR 66 | Temp 97.8°F | Ht 64.0 in | Wt 181.8 lb

## 2022-12-18 DIAGNOSIS — R519 Headache, unspecified: Secondary | ICD-10-CM

## 2022-12-18 DIAGNOSIS — M26622 Arthralgia of left temporomandibular joint: Secondary | ICD-10-CM | POA: Diagnosis not present

## 2022-12-18 DIAGNOSIS — N6320 Unspecified lump in the left breast, unspecified quadrant: Secondary | ICD-10-CM | POA: Diagnosis not present

## 2022-12-18 DIAGNOSIS — E119 Type 2 diabetes mellitus without complications: Secondary | ICD-10-CM

## 2022-12-18 DIAGNOSIS — D72829 Elevated white blood cell count, unspecified: Secondary | ICD-10-CM | POA: Diagnosis not present

## 2022-12-18 DIAGNOSIS — E039 Hypothyroidism, unspecified: Secondary | ICD-10-CM | POA: Diagnosis not present

## 2022-12-18 DIAGNOSIS — R059 Cough, unspecified: Secondary | ICD-10-CM | POA: Diagnosis not present

## 2022-12-18 DIAGNOSIS — R590 Localized enlarged lymph nodes: Secondary | ICD-10-CM

## 2022-12-18 DIAGNOSIS — F32A Depression, unspecified: Secondary | ICD-10-CM

## 2022-12-18 DIAGNOSIS — F419 Anxiety disorder, unspecified: Secondary | ICD-10-CM

## 2022-12-18 DIAGNOSIS — N644 Mastodynia: Secondary | ICD-10-CM

## 2022-12-18 MED ORDER — BUPROPION HCL ER (XL) 150 MG PO TB24
150.0000 mg | ORAL_TABLET | Freq: Every morning | ORAL | 1 refills | Status: DC
  Filled 2022-12-18: qty 30, 30d supply, fill #0
  Filled 2023-01-20: qty 30, 30d supply, fill #1

## 2022-12-18 MED ORDER — KETOROLAC TROMETHAMINE 60 MG/2ML IM SOLN
60.0000 mg | Freq: Once | INTRAMUSCULAR | Status: AC
  Administered 2022-12-18: 60 mg via INTRAMUSCULAR

## 2022-12-18 NOTE — Patient Instructions (Signed)
VISIT SUMMARY:  During today's visit, we discussed several of your ongoing health concerns, including persistent headaches, a chronic cough, left breast pain, depression, and other issues. We have outlined a plan to address each of these concerns and will follow up as needed.  YOUR PLAN:  -LEFT NECK SWELLING: Swelling and discomfort were noted on the left side of your neck. We have ordered a neck ultrasound to further investigate the cause of this swelling.  -BREAST PAIN: You reported pain in your left breast, which was previously affected by cancer. We did not find any lumps during the physical exam, but we have ordered a breast ultrasound to check for any changes since your last mammogram.  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 We have referred you to a dentist for an evaluation of your TMJ and to consider getting a mouth guard.  -DEPRESSION: You mentioned ongoing depression despite taking escitalopram. We have added Wellbutrin to your current medication to help improve your symptoms.  -HYPOTHYROIDISM: You are currently taking levothyroxine for hypothyroidism but are experiencing symptoms that may suggest your dose is too high. We will check your thyroid levels today to see if any adjustments are needed.  -TYPE 2 DIABETES: Your blood glucose levels are in the 140s, which can vary based on your diet. You should continue your current regimen of metformin twice daily.  -CHRONIC COUGH: You have a chronic cough with a sensation of mucus buildup, but no recent illness. We will continue to monitor your symptoms, and you should follow up with your pulmonologist as scheduled in January.  -ARTHRITIS: You have ongoing pain, particularly in your hip. We have given you a Toradol shot today to help relieve the pain.  -HISTORY OF CANCER: You have a history of uterine and breast cancer and are currently in remission. Please continue with your  regular follow-ups with your oncologist.  INSTRUCTIONS:  1. Get a neck ultrasound as ordered to evaluate the swelling on the left side of your neck. 2. Get a breast ultrasound to check for any changes since your last mammogram. 3. Follow up with a dentist for an evaluation of your TMJ and to consider getting a mouth guard. 4. Continue taking escitalopram and start Wellbutrin as prescribed to help with your depression. 5. Have your thyroid levels checked today to assess the efficacy of your current levothyroxine dose. 6. Continue your current regimen of metformin for Type 2 Diabetes. 7. Monitor your chronic cough symptoms and follow up with your pulmonologist in January. 8. Follow up with your oncologist as scheduled for your history of cancer.

## 2022-12-18 NOTE — Progress Notes (Unsigned)
Patient ID: Felicia [name], female    DOB: April 10, 1950, 72 y.o.   MRN: [id number]   Assessment & Plan:  Type 2 diabetes mellitus without complication, without long-term current use of insulin (HCC)        No follow-ups on file.    Subjective:    Chief Complaint  Patient presents with   Follow-up    Follow up diabetes mellitus    HPI Patient is in today for ***  Past Medical History:  Diagnosis Date   Anemia    Anxiety    Breast cancer (HCC) 09/2019   left breast IMC   CAD (coronary artery disease)    a. 3/2007s/p DES to the LAD [facility]); b. 01/2017 MV: EF 80%, small, mild apical ant defect w/o ischemia (felt to be breast atten). Low risk.   Depression    DM (diabetes mellitus) (HCC)    Essential hypertension    GERD (gastroesophageal reflux disease)    Headache    secondary to neck surgery per patient   High triglycerides    Hyperlipidemia    Hypothyroid    OA (osteoarthritis)    right knee,hands   Overflow incontinence    PONV (postoperative nausea and vomiting)    s/p gallbladder    RLS (restless legs syndrome)    Urinary urgency    Uterine cancer (HCC) dx'd 2014    Past Surgical History:  Procedure Laterality Date   BREAST LUMPECTOMY WITH RADIOACTIVE SEED AND SENTINEL LYMPH NODE BIOPSY Left 10/07/2019   Procedure: LEFT BREAST LUMPECTOMY WITH RADIOACTIVE SEED AND SENTINEL LYMPH NODE BIOPSY;  Surgeon: Emelia Loron, MD;  Location: [facility];  Service: General;  Laterality: Left;   CORONARY STENT PLACEMENT     CORONARY/GRAFT ACUTE MI REVASCULARIZATION     FINGER ARTHRODESIS Right 06/11/2019   Procedure: ARTHRODESIS INDEX FINGER DISTAL PHALANGEAL JOINT;  Surgeon: Betha Loa, MD;  Location: [facility];  Service: Orthopedics;  Laterality: Right;   FOOT SURGERY Left    LEFT HEART CATH AND CORONARY ANGIOGRAPHY N/A 02/16/2018   Procedure: LEFT HEART CATH AND CORONARY ANGIOGRAPHY;  Surgeon: Runell Gess, MD;   [facility];  Service: Cardiovascular;  Laterality: N/A;   LEFT HEART CATH AND CORONARY ANGIOGRAPHY N/A 05/01/2020   Procedure: LEFT HEART CATH AND CORONARY ANGIOGRAPHY;  Surgeon: Runell Gess, MD;  [facility]: [facility];  Service: Cardiovascular;  Laterality: N/A;   PORTACATH PLACEMENT Right 11/23/2019   Procedure: INSERTION PORT-A-CATH WITH ULTRASOUND GUIDANCE;  Surgeon: Emelia Loron, MD;  Location: [facility];  Service: General;  Laterality: Right;   REPLACEMENT TOTAL KNEE Right    SPINE SURGERY     TOTAL ABDOMINAL HYSTERECTOMY     FOR UTERUS CANCER   TOTAL HIP ARTHROPLASTY Left 2015    Family History  Problem Relation Age of Onset   AAA (abdominal aortic aneurysm) Mother    Diabetes Father    Alzheimer's disease Father    Throat cancer Sister    Breast cancer Cousin     Social History   Tobacco Use   Smoking status: Never   Smokeless tobacco: Never  Vaping Use   Vaping status: Never Used  Substance Use Topics   Alcohol use: Yes    Comment: OCCASIONALLY   Drug use: No     Allergies  Allergen Reactions   Chloraprep One Step [Chlorhexidine Gluconate] Rash   Estrogens Other (See Comments)    PATIENT HAS A  HISTORY OF CANCER AND HAS BEEN TOLD TO NEVER TAKE ANYTHING CONTAINING ESTROGEN, AS IT MIGHT CAUSE A RECURRENCE   Shellfish-Derived Products Nausea And Vomiting   Shrimp [Shellfish Allergy] Nausea And Vomiting    Review of Systems NEGATIVE UNLESS OTHERWISE INDICATED IN HPI      Objective:     BP 124/70   Pulse 66   Temp 97.8 F (36.6 C) (Temporal)   Ht 5\' 4"  (1.626 m)   Wt 181 lb 12.8 oz (82.5 kg)   SpO2 95%   BMI 31.21 kg/m   Wt Readings from Last 3 Encounters:  12/18/22 181 lb 12.8 oz (82.5 kg)  11/20/22 183 lb (83 kg)  10/23/22 182 lb 3.2 oz (82.6 kg)    BP Readings from Last 3 Encounters:  12/18/22 124/70  11/20/22 136/82  10/23/22 (!) 142/76     Physical Exam        Time Spent: ***  minutes of total time was spent on the date of the encounter performing the following actions: chart review prior to seeing the patient, obtaining history, performing a medically necessary exam, counseling on the treatment plan, placing orders, and documenting in our EHR.       Demauri Advincula M Brooks Stotz, PA-C

## 2022-12-19 LAB — CBC WITH DIFFERENTIAL/PLATELET
Basophils Absolute: 0.1 10*3/uL (ref 0.0–0.1)
Basophils Relative: 0.7 % (ref 0.0–3.0)
Eosinophils Absolute: 0.1 10*3/uL (ref 0.0–0.7)
Eosinophils Relative: 0.9 % (ref 0.0–5.0)
HCT: 44.7 % (ref 36.0–46.0)
Hemoglobin: 14.7 g/dL (ref 12.0–15.0)
Lymphocytes Relative: 67.7 % — ABNORMAL HIGH (ref 12.0–46.0)
Lymphs Abs: 8.6 10*3/uL — ABNORMAL HIGH (ref 0.7–4.0)
MCHC: 32.9 g/dL (ref 30.0–36.0)
MCV: 95.4 fL (ref 78.0–100.0)
Monocytes Absolute: 0.6 10*3/uL (ref 0.1–1.0)
Monocytes Relative: 4.4 % (ref 3.0–12.0)
Neutro Abs: 3.4 10*3/uL (ref 1.4–7.7)
Neutrophils Relative %: 26.3 % — ABNORMAL LOW (ref 43.0–77.0)
Platelets: 341 10*3/uL (ref 150.0–400.0)
RBC: 4.68 Mil/uL (ref 3.87–5.11)
RDW: 14.9 % (ref 11.5–15.5)
WBC: 12.7 10*3/uL — ABNORMAL HIGH (ref 4.0–10.5)

## 2022-12-19 LAB — COMPREHENSIVE METABOLIC PANEL
ALT: 21 U/L (ref 0–35)
AST: 16 U/L (ref 0–37)
Albumin: 4.4 g/dL (ref 3.5–5.2)
Alkaline Phosphatase: 41 U/L (ref 39–117)
BUN: 23 mg/dL (ref 6–23)
CO2: 26 meq/L (ref 19–32)
Calcium: 10.4 mg/dL (ref 8.4–10.5)
Chloride: 103 meq/L (ref 96–112)
Creatinine, Ser: 0.78 mg/dL (ref 0.40–1.20)
GFR: 75.6 mL/min (ref >60.00)
Glucose, Bld: 166 mg/dL — ABNORMAL HIGH (ref 70–99)
Potassium: 4.5 meq/L (ref 3.5–5.1)
Sodium: 139 meq/L (ref 135–145)
Total Bilirubin: 0.3 mg/dL (ref 0.2–1.2)
Total Protein: 7.2 g/dL (ref 6.0–8.3)

## 2022-12-19 LAB — MICROALBUMIN / CREATININE URINE RATIO
Creatinine,U: 42.1 mg/dL
Microalb Creat Ratio: 1.7 mg/g (ref 0.0–30.0)
Microalb, Ur: <0.7 mg/dL (ref 0.0–1.9)

## 2022-12-19 LAB — TSH: TSH: 0.81 u[IU]/mL (ref 0.35–5.50)

## 2022-12-19 LAB — T4, FREE: Free T4: 1.2 ng/dL (ref 0.60–1.60)

## 2022-12-20 ENCOUNTER — Encounter: Payer: Self-pay | Admitting: Physician Assistant

## 2022-12-23 ENCOUNTER — Other Ambulatory Visit: Payer: Self-pay | Admitting: Physician Assistant

## 2022-12-23 DIAGNOSIS — N6320 Unspecified lump in the left breast, unspecified quadrant: Secondary | ICD-10-CM

## 2022-12-23 DIAGNOSIS — R590 Localized enlarged lymph nodes: Secondary | ICD-10-CM

## 2022-12-23 NOTE — Telephone Encounter (Signed)
Please schedule an office visit with PCP for assessment and treatment of fall

## 2022-12-24 ENCOUNTER — Telehealth: Payer: Self-pay

## 2022-12-24 ENCOUNTER — Ambulatory Visit: Payer: Self-pay

## 2022-12-24 NOTE — Telephone Encounter (Signed)
Called pt after seeing MyChart message regarding fall. Pt scheduled appt to see PCP 12/31/22 however PCP recommends pt being evaluated at the ED due to possible head trauma and health concerns. Pt missed appt with Care Coordinator today as well and reached out to daughter Trula Ore to see if she has spoken with patient. Advised cb number on both vm's

## 2022-12-24 NOTE — Telephone Encounter (Signed)
LVM informing pt of message below.

## 2022-12-24 NOTE — Patient Outreach (Signed)
  Care Coordination   12/24/2022 Name: Felicia [name] MRN: [id number] DOB: 11/10/1950   Care Coordination Outreach Attempts:  An unsuccessful telephone outreach was attempted for a scheduled appointment today.  Follow Up Plan:  Additional outreach attempts will be made to offer the patient care coordination information and services.   Encounter Outcome:  No Answer   Care Coordination Interventions:  No, not indicated    Naveh Rickles Idelle Jo, RN, MSN RN Care Manager [facility], Population Health Direct Dial: [phone]  Fax: [phone] Website: [name]

## 2022-12-24 NOTE — Telephone Encounter (Signed)
Pt called back and confirmed she is ok, pt will keep appt with PCP next week and PCP advised this is fine but any changes she will let us know

## 2022-12-24 NOTE — Telephone Encounter (Signed)
Patient is scheduled with PCP 12/31/22

## 2022-12-24 NOTE — Telephone Encounter (Signed)
FYI

## 2022-12-24 NOTE — Telephone Encounter (Signed)
Called pt to advise and went straight to VM; lvm the importance of being evaluated at the ED; also advised cb number to call office to confirm she is going to be seen at ED.

## 2022-12-25 ENCOUNTER — Other Ambulatory Visit: Payer: Self-pay | Admitting: Physician Assistant

## 2022-12-25 ENCOUNTER — Encounter: Payer: Self-pay | Admitting: Oncology

## 2022-12-25 ENCOUNTER — Other Ambulatory Visit (HOSPITAL_COMMUNITY): Payer: Self-pay

## 2022-12-25 MED ORDER — LEVOTHYROXINE SODIUM 125 MCG PO TABS
125.0000 ug | ORAL_TABLET | Freq: Every day | ORAL | 2 refills | Status: DC
  Filled 2022-12-25: qty 30, 30d supply, fill #0
  Filled 2023-01-24: qty 30, 30d supply, fill #1
  Filled 2023-02-17: qty 30, 30d supply, fill #2

## 2022-12-25 MED FILL — Loratadine Tab 10 MG: ORAL | 30 days supply | Qty: 30 | Fill #3 | Status: AC

## 2022-12-25 NOTE — Telephone Encounter (Signed)
Please see refill request and advise.

## 2022-12-31 ENCOUNTER — Encounter: Payer: Self-pay | Admitting: Physician Assistant

## 2022-12-31 ENCOUNTER — Other Ambulatory Visit: Payer: Self-pay

## 2022-12-31 ENCOUNTER — Other Ambulatory Visit (HOSPITAL_COMMUNITY): Payer: Self-pay

## 2022-12-31 ENCOUNTER — Ambulatory Visit (INDEPENDENT_AMBULATORY_CARE_PROVIDER_SITE_OTHER): Payer: 59 | Admitting: Physician Assistant

## 2022-12-31 VITALS — BP 130/68 | HR 60 | Temp 97.1°F | Ht 64.0 in | Wt 186.4 lb

## 2022-12-31 DIAGNOSIS — S0083XA Contusion of other part of head, initial encounter: Secondary | ICD-10-CM | POA: Diagnosis not present

## 2022-12-31 DIAGNOSIS — R519 Headache, unspecified: Secondary | ICD-10-CM | POA: Diagnosis not present

## 2022-12-31 DIAGNOSIS — W19XXXA Unspecified fall, initial encounter: Secondary | ICD-10-CM

## 2022-12-31 DIAGNOSIS — G8929 Other chronic pain: Secondary | ICD-10-CM | POA: Diagnosis not present

## 2022-12-31 MED ORDER — TRAMADOL HCL 50 MG PO TABS
50.0000 mg | ORAL_TABLET | Freq: Four times a day (QID) | ORAL | 0 refills | Status: DC | PRN
  Filled 2022-12-31: qty 30, 8d supply, fill #0

## 2022-12-31 NOTE — Patient Instructions (Addendum)
VISIT SUMMARY:  You visited [place] today after experiencing a fall on 12/6, which resulted in facial abrasions and bruising. You reported worsening TMJ pain and increased difficulty eating due to pain and swelling on the left side of your face. Additionally, your left knee, previously diagnosed with arthritis, was injured in the fall and is now swollen and painful. You also mentioned waking up in the middle of the night with pain in the back of your throat, possibly due to sleeping with your mouth open.  YOUR PLAN:  -FACIAL TRAUMA: You experienced facial trauma from your fall but have no new headaches or dizziness. Your TMJ pain has worsened since the fall. We recommend considering Voltaren gel for the TMJ pain and seeking consultation with a dentist specializing in TMJ.  -KNEE PAIN: Your chronic knee pain, which was worsened by the recent fall, has resulted in swelling and fluid accumulation. We suggest returning to Dr. Denyse Amass for possible knee drainage and further management.  -SLEEP DISTURBANCE: You are having difficulty sleeping due to knee pain and TMJ discomfort. Continue your current regimen of Tizanidine at bedtime as needed to help with sleep.  -GENERAL HEALTH MAINTENANCE: Continue taking your current medications, including Celebrex. We may consider referring you to a physical therapist for TMJ management if needed.  INSTRUCTIONS:  Please follow up with Dr. Denyse Amass for your knee pain management. Additionally, seek a consultation with a dentist specializing in TMJ for your facial pain. Continue using Tizanidine at bedtime as needed for sleep, and keep taking your current medications, including Celebrex and Tramadol for pain.

## 2022-12-31 NOTE — Progress Notes (Signed)
Patient ID: Felicia [name], female    DOB: [birth year], 72 y.o.   MRN: [id number]   Assessment & Plan:  Fall, initial encounter  Traumatic ecchymosis of face, initial encounter  Other chronic pain -     traMADol HCl; Take 1 tablet (50 mg total) by mouth every 6 (six) hours as needed for moderate pain (pain score 4-6) or severe pain (pain score 7-10).  Dispense: 30 tablet; Refill: 0  Left facial pain     Assessment and Plan    Facial Trauma Fall on 12/20/2022 resulting in facial trauma. No reported headaches, dizziness, or other neurological symptoms. Noted worsening of pre-existing TMJ symptoms post-fall. -No indication for further imaging at this time; pt declines as well; shared-decision making on this -Consider Voltaren gel for TMJ pain. -Seek consultation with a dentist specializing in TMJ.  Knee Pain Chronic knee pain, worsened by recent fall. Noted swelling and fluid accumulation. Previous cortisone injection by Dr. Denyse Amass provided temporary relief. -Return to Dr. Denyse Amass for possible knee drainage and further management. -She has Tramadol Rx from me for moderate pain; hydrocodone Rx from Dr. Denyse Amass; pt is aware to NOT take these medications on the same day -PDMP reviewed today, no red flags, filling appropriately.    Sleep Disturbance Reports difficulty sleeping due to knee pain and TMJ discomfort. -Continue current regimen of Tizanidine at bedtime as needed.    No follow-ups on file.    Subjective:    Chief Complaint  Patient presents with   Fall    Pt in office for evaluation after recent fall; wanting to get a refill of tramadol; also requesting duplicate medications being removed from changing pharmacies from [facility]; pt is bruised all over; both eyes; knee pain; Pt states hurts to eat and TMJ is getting worse.    Fall   Discussed the use of AI scribe software for clinical note transcription with the patient, who gave verbal consent to proceed.  History  of Present Illness   The patient, with a history of TMJ and knee arthritis, presents after a fall on 12/6. She tripped over a curb and landed on her face, unable to get her hands up in time to break the fall. She sustained facial abrasions and bruising, but denies any new headaches or dizziness. She reports that her TMJ pain has worsened since the fall, with increased difficulty eating due to pain and swelling on the left side of her face. She also reports waking up in the middle of the night with pain in the back of her throat, possibly due to sleeping with her mouth open. She has an upcoming appointment for imaging of her TMJ.  The patient's left knee, previously diagnosed with arthritis and treated with a cortisone injection, was also injured in the fall. She reports that the knee is swollen, painful, and appears to have fluid on it. The pain is severe enough to disrupt her sleep. She has been icing the knee and using pain medication, but reports minimal relief.        Past Medical History:  Diagnosis Date   Anemia    Anxiety    Breast cancer (HCC) 09/2019   left breast IMC   CAD (coronary artery disease)    a. 3/2007s/p DES to the LAD [facility]); b. 01/2017 MV: EF 80%, small, mild apical ant defect w/o ischemia (felt to be breast atten). Low risk.   Depression    DM (diabetes mellitus) (HCC)    Essential hypertension  GERD (gastroesophageal reflux disease)    Headache    secondary to neck surgery per patient   High triglycerides    Hyperlipidemia    Hypothyroid    OA (osteoarthritis)    right knee,hands   Overflow incontinence    PONV (postoperative nausea and vomiting)    s/p gallbladder    RLS (restless legs syndrome)    Urinary urgency    Uterine cancer (HCC) dx'd 2014    Past Surgical History:  Procedure Laterality Date   BREAST LUMPECTOMY WITH RADIOACTIVE SEED AND SENTINEL LYMPH NODE BIOPSY Left 10/07/2019   Procedure: LEFT BREAST LUMPECTOMY WITH RADIOACTIVE SEED  AND SENTINEL LYMPH NODE BIOPSY;  Surgeon: Emelia Loron, MD;  Location: [facility];  Service: General;  Laterality: Left;   CORONARY STENT PLACEMENT     CORONARY/GRAFT ACUTE MI REVASCULARIZATION     FINGER ARTHRODESIS Right 06/11/2019   Procedure: ARTHRODESIS INDEX FINGER DISTAL PHALANGEAL JOINT;  Surgeon: Betha Loa, MD;  Location: [facility];  Service: Orthopedics;  Laterality: Right;   FOOT SURGERY Left    LEFT HEART CATH AND CORONARY ANGIOGRAPHY N/A 02/16/2018   Procedure: LEFT HEART CATH AND CORONARY ANGIOGRAPHY;  Surgeon: Runell Gess, MD;  [facility];  Service: Cardiovascular;  Laterality: N/A;   LEFT HEART CATH AND CORONARY ANGIOGRAPHY N/A 05/01/2020   Procedure: LEFT HEART CATH AND CORONARY ANGIOGRAPHY;  Surgeon: Runell Gess, MD;  [facility];  Service: Cardiovascular;  Laterality: N/A;   PORTACATH PLACEMENT Right 11/23/2019   Procedure: INSERTION PORT-A-CATH WITH ULTRASOUND GUIDANCE;  Surgeon: Emelia Loron, MD;  Location: [facility];  Service: General;  Laterality: Right;   REPLACEMENT TOTAL KNEE Right    SPINE SURGERY     TOTAL ABDOMINAL HYSTERECTOMY     FOR UTERUS CANCER   TOTAL HIP ARTHROPLASTY Left 2015    Family History  Problem Relation Age of Onset   AAA (abdominal aortic aneurysm) Mother    Diabetes Father    Alzheimer's disease Father    Throat cancer Sister    Breast cancer Cousin     Social History   Tobacco Use   Smoking status: Never   Smokeless tobacco: Never  Vaping Use   Vaping status: Never Used  Substance Use Topics   Alcohol use: Yes    Comment: OCCASIONALLY   Drug use: No     Allergies  Allergen Reactions   Chloraprep One Step [Chlorhexidine Gluconate] Rash   Estrogens Other (See Comments)    PATIENT HAS A HISTORY OF CANCER AND HAS BEEN TOLD TO NEVER TAKE ANYTHING CONTAINING ESTROGEN, AS IT MIGHT CAUSE A RECURRENCE   Shellfish-Derived  Products Nausea And Vomiting   Shrimp [Shellfish Allergy] Nausea And Vomiting    Review of Systems NEGATIVE UNLESS OTHERWISE INDICATED IN HPI      Objective:     BP 130/68 (BP Location: Right Arm, Patient Position: Sitting, Cuff Size: Normal)   Pulse 60   Temp (!) 97.1 F (36.2 C) (Temporal)   Ht 5\' 4"  (1.626 m)   Wt 186 lb 6.4 oz (84.6 kg)   SpO2 97%   BMI 32.00 kg/m   Wt Readings from Last 3 Encounters:  12/31/22 186 lb 6.4 oz (84.6 kg)  12/18/22 181 lb 12.8 oz (82.5 kg)  11/20/22 183 lb (83 kg)    BP Readings from Last 3 Encounters:  12/31/22 130/68  12/18/22 124/70  11/20/22 136/82     Physical Exam  Vitals and nursing note reviewed.  Constitutional:      Appearance: Normal appearance.  Eyes:     Extraocular Movements: Extraocular movements intact.     Conjunctiva/sclera: Conjunctivae normal.     Pupils: Pupils are equal, round, and reactive to light.  Cardiovascular:     Rate and Rhythm: Normal rate and regular rhythm.  Pulmonary:     Effort: Pulmonary effort is normal.     Breath sounds: Normal breath sounds.  Musculoskeletal:        General: Swelling (minimal swelling L TMJ with pain to palpation; no click noted, normal ROM) and tenderness (left knee some swelling and TTP) present.  Skin:    Findings: Bruising (yellowed ecchymosis bilateral cheeks and eyes) present.  Neurological:     General: No focal deficit present.     Mental Status: She is alert and oriented to person, place, and time.     Cranial Nerves: No cranial nerve deficit.     Motor: No weakness.     Gait: Gait normal.  Psychiatric:        Mood and Affect: Mood normal.          Time Spent: 30 minutes of total time was spent on the date of the encounter performing the following actions: chart review prior to seeing the patient, obtaining history, performing a medically necessary exam, counseling on the treatment plan, placing orders, and documenting in our EHR.       Arrie Borrelli M  Paighton Godette, PA-C

## 2023-01-02 ENCOUNTER — Other Ambulatory Visit (HOSPITAL_COMMUNITY): Payer: Self-pay

## 2023-01-02 ENCOUNTER — Telehealth: Payer: Self-pay | Admitting: Physician Assistant

## 2023-01-02 ENCOUNTER — Other Ambulatory Visit: Payer: Self-pay

## 2023-01-02 NOTE — Telephone Encounter (Signed)
Copied from CRM [id number]. Topic: Referral - Question >> Jan 02, 2023  1:36 PM Alphonzo Lemmings O wrote: Reason for CRM: patient is calling requesting that the referral for a breast ultrasound be sent to [facility] on [address]

## 2023-01-03 ENCOUNTER — Telehealth: Payer: Self-pay

## 2023-01-03 NOTE — Patient Outreach (Signed)
  Care Coordination   Follow Up Visit Note   01/03/2023 Name: Felicia [name] MRN: [id number] DOB: 07/29/1950  Felicia [name] is a 72 y.o. year old female who sees Allwardt, Crist Infante, PA-C for primary care. I spoke with  [name] by phone today.  What matters to the patients health and wellness today?  Recovery from fall.    Goals Addressed             This Visit's Progress    Diabetes Management       Patient Goals/Self Care Activities: -Patient/Caregiver will self-administer medications as prescribed as evidenced by self-report/primary caregiver report  -Patient/Caregiver will attend all scheduled provider appointments as evidenced by clinician review of documented attendance to scheduled appointments and patient/caregiver report -Patient/Caregiver will call provider office for new concerns or questions as evidenced by review of documented incoming telephone call notes and patient report  -check blood sugar at prescribed times -check blood sugar if I feel it is too high or too low -record values and write them down take them to all doctor visits    Patient reports a recent fall while at [employer].  Some injury but saw PCP on 12/31/22.  Discussed fall prevention.  Blood sugars doing well 125-130's.  Reiterated diabetes management.  No concerns.          SDOH assessments and interventions completed:  Yes     Care Coordination Interventions:  Yes, provided   Follow up plan: Follow up call scheduled for January    Encounter Outcome:  Patient Visit Completed   Bary Leriche, RN, MSN RN Care Manager [facility], Population Health Direct Dial: [phone]  Fax: [phone] Website: [name]

## 2023-01-03 NOTE — Patient Instructions (Signed)
Visit Information  Thank you for taking time to visit with me today. Please don't hesitate to contact me if I can be of assistance to you.   Following are the goals we discussed today:   Goals Addressed             This Visit's Progress    Diabetes Management       Patient Goals/Self Care Activities: -Patient/Caregiver will self-administer medications as prescribed as evidenced by self-report/primary caregiver report  -Patient/Caregiver will attend all scheduled provider appointments as evidenced by clinician review of documented attendance to scheduled appointments and patient/caregiver report -Patient/Caregiver will call provider office for new concerns or questions as evidenced by review of documented incoming telephone call notes and patient report  -check blood sugar at prescribed times -check blood sugar if I feel it is too high or too low -record values and write them down take them to all doctor visits    Patient reports a recent fall while at tree [employer].  Some injury but saw PCP on 12/31/22.  Discussed fall prevention.  Blood sugars doing well 125-130's.  Reiterated diabetes management.  No concerns.          Our next appointment is by telephone on 02/04/23 at 1030 am  Please call the care guide team at [phone] if you need to cancel or reschedule your appointment.   If you are experiencing a Mental Health or Behavioral Health Crisis or need someone to talk to, please call the Suicide and Crisis Lifeline: 988   The patient verbalized understanding of instructions, educational materials, and care plan provided today and DECLINED offer to receive copy of patient instructions, educational materials, and care plan.   The patient has been provided with contact information for the care management team and has been advised to call with any health related questions or concerns.   Bary Leriche, RN, MSN RN Care Manager [facility], Population  Health Direct Dial: [phone]  Fax: [phone] Website: [name]

## 2023-01-06 NOTE — Telephone Encounter (Signed)
Order changed per PCP request

## 2023-01-09 ENCOUNTER — Ambulatory Visit
Admission: RE | Admit: 2023-01-09 | Discharge: 2023-01-09 | Disposition: A | Discharge status: Home / Self Care | Payer: 59 | Source: Ambulatory Visit | Attending: Physician Assistant | Admitting: Physician Assistant

## 2023-01-09 ENCOUNTER — Other Ambulatory Visit: Payer: 59

## 2023-01-09 DIAGNOSIS — Z853 Personal history of malignant neoplasm of breast: Secondary | ICD-10-CM | POA: Diagnosis not present

## 2023-01-09 DIAGNOSIS — M542 Cervicalgia: Secondary | ICD-10-CM | POA: Diagnosis not present

## 2023-01-09 DIAGNOSIS — R590 Localized enlarged lymph nodes: Secondary | ICD-10-CM

## 2023-01-09 DIAGNOSIS — M25512 Pain in left shoulder: Secondary | ICD-10-CM | POA: Diagnosis not present

## 2023-01-09 DIAGNOSIS — N6320 Unspecified lump in the left breast, unspecified quadrant: Secondary | ICD-10-CM

## 2023-01-14 ENCOUNTER — Encounter: Payer: Self-pay | Admitting: Dietician

## 2023-01-14 ENCOUNTER — Encounter: Payer: 59 | Attending: Physician Assistant | Admitting: Dietician

## 2023-01-14 VITALS — Wt 184.0 lb

## 2023-01-14 DIAGNOSIS — Z7984 Long term (current) use of oral hypoglycemic drugs: Secondary | ICD-10-CM | POA: Insufficient documentation

## 2023-01-14 DIAGNOSIS — E119 Type 2 diabetes mellitus without complications: Secondary | ICD-10-CM | POA: Insufficient documentation

## 2023-01-14 DIAGNOSIS — Z794 Long term (current) use of insulin: Secondary | ICD-10-CM | POA: Diagnosis not present

## 2023-01-14 NOTE — Patient Instructions (Signed)
 New Goals:  Do one of the exercise DVDs or youtube beginner chair workout in the morning at least 1 time per week.   Sit on the balance ball daily for 15 minutes.

## 2023-01-14 NOTE — Progress Notes (Signed)
 Diabetes Self-Management Education  Visit Type: Follow-up  Appt. Start Time: 1405 Appt. End Time: 1435  01/14/2023  [name], identified by name and date of birth, is a 72 y.o. female with a diagnosis of Diabetes: type 2  .   ASSESSMENT  History includes: anemia, anxiety, arthritis, cancer, depression, type 2 diabetes, GERD, HLD, HTN, thyroid  disease, TMJ Labs noted: A1c 8.3 10/23/22 Medications include: jardiance , glipizide , lantus , metformin  Supplements: none reported  Wt Readings from Last 3 Encounters:  01/14/23 184 lb (83.5 kg)  12/31/22 186 lb 6.4 oz (84.6 kg)  12/18/22 181 lb 12.8 oz (82.5 kg)    A1c down from 10.9 on 07/08/22 to 8.3 on 10/23/22.  Pt states she brought her metformin  dose down to 1 in the morning and 1 in the evening and is hoping to stop metformin  due to diarrhea. Pt states she is going to see a gastroenterologist soon for this issue.    Pt states sometimes she has issues swallowing. Pt states she's been having issues with buying enough food and does not have a car.   Pt states she fell stepping up a curb on December 6th.   Assessment of previous goals:   Goal: do one of the exercise DVDs or youtube beginner chair workout in the morning 3 times per week. - goal not met   Goal: start tracking water  intake, aim for 64 oz per day. - goal met  New Goals:  Do one of the exercise DVDs or youtube beginner chair workout in the morning at least 1 time per week.   Sit on the balance ball daily for 15 minutes.  Weight 184 lb (83.5 kg). Body mass index is 31.58 kg/m.   Diabetes Self-Management Education - 01/14/23 1356       Visit Information   Visit Type Follow-up      Health Coping   How would you rate your overall health? Good      Psychosocial Assessment   Patient Belief/Attitude about Diabetes Motivated to manage diabetes    What is the hardest part about your diabetes right now, causing you the most concern, or is the most worrisome  to you about your diabetes?   Making healty food and beverage choices    Self-care barriers None    Self-management support Doctor's office    Other persons present Patient    Patient Concerns Nutrition/Meal planning    Special Needs None    Preferred Learning Style No preference indicated    Learning Readiness Ready      Pre-Education Assessment   Patient understands the diabetes disease and treatment process. Needs Review    Patient understands incorporating nutritional management into lifestyle. Needs Review    Patient undertands incorporating physical activity into lifestyle. Needs Review    Patient understands using medications safely. Needs Review    Patient understands monitoring blood glucose, interpreting and using results Needs Review    Patient understands prevention, detection, and treatment of acute complications. Needs Review    Patient understands prevention, detection, and treatment of chronic complications. Needs Review    Patient understands how to develop strategies to address psychosocial issues. Needs Review    Patient understands how to develop strategies to promote health/change behavior. Needs Review      Complications   Last HgB A1C per patient/outside source 8.3 %      Dietary Intake   Breakfast toast    Snack (morning) cheese stick    Lunch bologna sandwich  Dinner chicken thigh, potatoes and creamed corn.    Beverage(s) diet soda, water , sparkling water , hot tea with milk and coconut sugar      Activity / Exercise   Activity / Exercise Type ADL's    How many days per week do you exercise? 0    How many minutes per day do you exercise? 0    Total minutes per week of exercise 0      Patient Education   Previous Diabetes Education Yes (please comment)    Disease Pathophysiology Explored patient's options for treatment of their diabetes    Healthy Eating Role of diet in the treatment of diabetes and the relationship between the three main  macronutrients and blood glucose level;Plate Method;Meal timing in regards to the patients' current diabetes medication.;Meal options for control of blood glucose level and chronic complications.    Being Active Role of exercise on diabetes management, blood pressure control and cardiac health.;Identified with patient nutritional and/or medication changes necessary with exercise.    Medications Reviewed patients medication for diabetes, action, purpose, timing of dose and side effects.    Monitoring Identified appropriate SMBG and/or A1C goals.    Chronic complications Relationship between chronic complications and blood glucose control;Identified and discussed with patient  current chronic complications    Lifestyle and Health Coping Lifestyle issues that need to be addressed for better diabetes care      Individualized Goals (developed by patient)   Nutrition General guidelines for healthy choices and portions discussed    Physical Activity Exercise 3-5 times per week;15 minutes per day    Medications take my medication as prescribed    Monitoring  Test my blood glucose as discussed    Problem Solving Eating Pattern    Reducing Risk examine blood glucose patterns;do foot checks daily;treat hypoglycemia with 15 grams of carbs if blood glucose less than 70mg /dL    Health Coping Ask for help with psychological, social, or emotional issues      Patient Self-Evaluation of Goals - Patient rates self as meeting previously set goals (% of time)   Nutrition 50 - 75 % (half of the time)    Physical Activity < 25% (hardly ever/never)    Medications >75% (most of the time)    Monitoring 25 - 50% (sometimes)    Problem Solving and behavior change strategies  25 - 50% (sometimes)    Reducing Risk (treating acute and chronic complications) 25 - 50% (sometimes)    Health Coping 25 - 50% (sometimes)      Post-Education Assessment   Patient understands the diabetes disease and treatment process.  Comprehends key points    Patient understands incorporating nutritional management into lifestyle. Comprehends key points    Patient undertands incorporating physical activity into lifestyle. Comprehends key points    Patient understands using medications safely. Comphrehends key points    Patient understands monitoring blood glucose, interpreting and using results Comprehends key points    Patient understands prevention, detection, and treatment of acute complications. Comprehends key points    Patient understands prevention, detection, and treatment of chronic complications. Comprehends key points    Patient understands how to develop strategies to address psychosocial issues. Comprehends key points    Patient understands how to develop strategies to promote health/change behavior. Comprehends key points      Outcomes   Expected Outcomes Demonstrated interest in learning. Expect positive outcomes    Future DMSE 3-4 months    Program Status Not Completed  Subsequent Visit   Since your last visit have you continued or begun to take your medications as prescribed? Yes    Since your last visit have you experienced any weight changes? Loss    Weight Loss (lbs) 1             Individualized Plan for Diabetes Self-Management Training:   Learning Objective:  Patient will have a greater understanding of diabetes self-management. Patient education plan is to attend individual and/or group sessions per assessed needs and concerns.   Plan:   Patient Instructions  New Goals:  Do one of the exercise DVDs or youtube beginner chair workout in the morning at least 1 time per week.   Sit on the balance ball daily for 15 minutes.  Expected Outcomes:  Demonstrated interest in learning. Expect positive outcomes  Education material provided: Actuary List  If problems or questions, patient to contact team via:  Phone  Future DSME appointment: 3-4 months

## 2023-01-20 ENCOUNTER — Ambulatory Visit (INDEPENDENT_AMBULATORY_CARE_PROVIDER_SITE_OTHER): Payer: 59 | Admitting: Family Medicine

## 2023-01-20 ENCOUNTER — Other Ambulatory Visit: Payer: Self-pay

## 2023-01-20 ENCOUNTER — Ambulatory Visit (INDEPENDENT_AMBULATORY_CARE_PROVIDER_SITE_OTHER): Payer: 59

## 2023-01-20 ENCOUNTER — Telehealth: Payer: Self-pay

## 2023-01-20 ENCOUNTER — Encounter: Payer: Self-pay | Admitting: Family Medicine

## 2023-01-20 VITALS — BP 144/72 | HR 67 | Ht 64.0 in

## 2023-01-20 DIAGNOSIS — G8929 Other chronic pain: Secondary | ICD-10-CM | POA: Diagnosis not present

## 2023-01-20 DIAGNOSIS — M25562 Pain in left knee: Secondary | ICD-10-CM

## 2023-01-20 DIAGNOSIS — M25462 Effusion, left knee: Secondary | ICD-10-CM | POA: Diagnosis not present

## 2023-01-20 DIAGNOSIS — M1712 Unilateral primary osteoarthritis, left knee: Secondary | ICD-10-CM | POA: Diagnosis not present

## 2023-01-20 NOTE — Patient Instructions (Addendum)
 Thank you for coming in today.   [employer] on [address] [phone]  Please get an Xray today before you leave   We will be in touch about insurance coverage of the gel shots and long acting steroid.

## 2023-01-20 NOTE — Progress Notes (Signed)
   I, Leotis Batter, CMA acting as a scribe for Artist Lloyd, MD.  Felicia [name] is a 73 y.o. female who presents to [facility] today for cont'd L knee pain. Pt was last seen by Dr. Lloyd on 11/20/22 and was given a L knee steroid injection and was prescribed tizanidine  and hydrocodone .   Today, pt reports falling on 12/20/22 landing on the left knee. Did have some improvement of sx prior to fall. Now the knee is throbbing and swelling visible at medial aspect. Locates pain to medial and lateral aspects of the knee. Has been using topical Diclofenac , taking pain pills at bedtime prn, and taking Tylenol .   Dx imaging: 11/20/22 L knee XR  Pertinent review of systems: No fevers or chills  Relevant historical information: Diabetes   Exam:  BP (!) 144/72   Pulse 67   Ht 5' 4 (1.626 m)   SpO2 97%   BMI 31.58 kg/m  General: Well Developed, well nourished, and in no acute distress.   MSK: Left knee decreased quad bulk otherwise normal-appearing normal motion with crepitation.  Nontender.  Intact strength.    Lab and Radiology Results   Diagnostic Limited MSK Ultrasound of: Left knee Mild knee effusion.  Normal-appearing quad tendon. Normal-appearing patellar tendon. Degeneration medial joint line. Normal lateral joint line Tiny Baker's cyst Impression: Medial DJD   X-ray images left knee obtained today personally and independently interpreted Moderate medial compartment DJD.  No acute fractures are visible. Await formal radiology review   Assessment and Plan: 74 y.o. female with chronic left knee pain.  She had steroid injection 2 months ago and had recurrence of pain after a fall occurring about a month ago.  Her pain is improving a bit over the last few weeks.  Plan for repeat x-ray today and work on authorization for hyaluronic acid and Zilretta  to proceed as potential next steps.  Anticipate recheck in about a month for we can do which ever  shot makes the most sense after discussion.   PDMP not reviewed this encounter. Orders Placed This Encounter  Procedures   US  LIMITED JOINT SPACE STRUCTURES LOW LEFT(NO LINKED CHARGES)    Reason for Exam (SYMPTOM  OR DIAGNOSIS REQUIRED):   left knee pain    Preferred imaging location?:   [facility] Knee AP/LAT W/[place] Left    Standing Status:   Future    Number of Occurrences:   1    Expiration Date:   02/20/2023    Reason for Exam (SYMPTOM  OR DIAGNOSIS REQUIRED):   left knee pain    Preferred imaging location?:   [place] [facility]   No orders of the defined types were placed in this encounter.    Discussed warning signs or symptoms. Please see discharge instructions. Patient expresses understanding.   The above documentation has been reviewed and is accurate and complete Artist Lloyd, M.D.

## 2023-01-20 NOTE — Telephone Encounter (Signed)
 Check visco and zilretta

## 2023-01-21 ENCOUNTER — Telehealth: Payer: Self-pay

## 2023-01-21 DIAGNOSIS — N6321 Unspecified lump in the left breast, upper outer quadrant: Secondary | ICD-10-CM | POA: Diagnosis not present

## 2023-01-21 DIAGNOSIS — N644 Mastodynia: Secondary | ICD-10-CM | POA: Diagnosis not present

## 2023-01-21 NOTE — Telephone Encounter (Signed)
Check benefits for Zilretta for LEFT knee OA.

## 2023-01-22 NOTE — Telephone Encounter (Signed)
VOB initiated for ZILRETTA for LEFT knee OA

## 2023-01-22 NOTE — Telephone Encounter (Signed)
 VOB initiated for GELSYN for LEFT knee OA.

## 2023-01-24 ENCOUNTER — Other Ambulatory Visit: Payer: Self-pay | Admitting: Physician Assistant

## 2023-01-24 ENCOUNTER — Encounter: Payer: Self-pay | Admitting: Oncology

## 2023-01-24 ENCOUNTER — Other Ambulatory Visit: Payer: Self-pay

## 2023-01-24 ENCOUNTER — Other Ambulatory Visit (HOSPITAL_COMMUNITY): Payer: Self-pay

## 2023-01-24 MED ORDER — GLIPIZIDE ER 10 MG PO TB24
20.0000 mg | ORAL_TABLET | Freq: Every morning | ORAL | 1 refills | Status: DC
  Filled 2023-01-24: qty 180, 90d supply, fill #0

## 2023-01-27 ENCOUNTER — Ambulatory Visit: Payer: Medicare HMO | Attending: General Surgery

## 2023-01-27 VITALS — Wt 185.2 lb

## 2023-01-27 DIAGNOSIS — Z483 Aftercare following surgery for neoplasm: Secondary | ICD-10-CM | POA: Insufficient documentation

## 2023-01-27 NOTE — Therapy (Signed)
 OUTPATIENT PHYSICAL THERAPY SOZO SCREENING NOTE   Patient Name: Felicia [name] MRN: [id number] DOB:1950/08/27, 73 y.o., female Today's Date: 01/27/2023  PCP: Kathrene Mardy HERO, PA-C REFERRING PROVIDER: Ebbie Cough, MD   PT End of Session - 01/27/23 1513     Visit Number 1   # unchanged due to screen only   PT Start Time 1511    PT Stop Time 1518    PT Time Calculation (min) 7 min    Activity Tolerance Patient tolerated treatment well    Behavior During Therapy [facility] for tasks assessed/performed             Past Medical History:  Diagnosis Date   Anemia    Anxiety    Breast cancer (HCC) 09/2019   left breast IMC   CAD (coronary artery disease)    a. 3/2007s/p DES to the LAD ([place] ); b. 01/2017 MV: EF 80%, small, mild apical ant defect w/o ischemia (felt to be breast atten). Low risk.   Depression    DM (diabetes mellitus) (HCC)    Essential hypertension    GERD (gastroesophageal reflux disease)    Headache    secondary to neck surgery per patient   High triglycerides    Hyperlipidemia    Hypothyroid    OA (osteoarthritis)    right knee,hands   Overflow incontinence    PONV (postoperative nausea and vomiting)    s/p gallbladder    RLS (restless legs syndrome)    Urinary urgency    Uterine cancer (HCC) dx'd 2014   Past Surgical History:  Procedure Laterality Date   BREAST LUMPECTOMY WITH RADIOACTIVE SEED AND SENTINEL LYMPH NODE BIOPSY Left 10/07/2019   Procedure: LEFT BREAST LUMPECTOMY WITH RADIOACTIVE SEED AND SENTINEL LYMPH NODE BIOPSY;  Surgeon: Ebbie Cough, MD;  Location: [facility];  Service: General;  Laterality: Left;   CORONARY STENT PLACEMENT     CORONARY/GRAFT ACUTE MI REVASCULARIZATION     FINGER ARTHRODESIS Right 06/11/2019   Procedure: ARTHRODESIS INDEX FINGER DISTAL PHALANGEAL JOINT;  Surgeon: Murrell Drivers, MD;  Location: [facility];  Service: Orthopedics;  Laterality: Right;   FOOT SURGERY Left     LEFT HEART CATH AND CORONARY ANGIOGRAPHY N/A 02/16/2018   Procedure: LEFT HEART CATH AND CORONARY ANGIOGRAPHY;  Surgeon: Court Dorn PARAS, MD;  Location: [facility];  Service: Cardiovascular;  Laterality: N/A;   LEFT HEART CATH AND CORONARY ANGIOGRAPHY N/A 05/01/2020   Procedure: LEFT HEART CATH AND CORONARY ANGIOGRAPHY;  Surgeon: Court Dorn PARAS, MD;  Location: [facility];  Service: Cardiovascular;  Laterality: N/A;   PORTACATH PLACEMENT Right 11/23/2019   Procedure: INSERTION PORT-A-CATH WITH ULTRASOUND GUIDANCE;  Surgeon: Ebbie Cough, MD;  Location: [facility];  Service: General;  Laterality: Right;   REPLACEMENT TOTAL KNEE Right    SPINE SURGERY     TOTAL ABDOMINAL HYSTERECTOMY     FOR UTERUS CANCER   TOTAL HIP ARTHROPLASTY Left 2015   Patient Active Problem List   Diagnosis Date Noted   Referred otalgia of left ear 11/19/2022   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   Erosive osteoarthritis of multiple sites 09/26/2021   Frequent headaches 05/31/2021   Bilateral impacted cerumen 02/22/2021   Nasal septal perforation 02/22/2021   Postnasal drip 02/22/2021   Hoarseness 01/25/2021   Hyperglycemia due to type 2 diabetes mellitus (HCC) 01/18/2021   Mixed simple and mucopurulent chronic bronchitis (HCC) 12/27/2020   Centrilobular emphysema (HCC) 07/31/2020  Shortness of breath 07/31/2020   Tongue lesion 04/30/2020   Hypothyroid    Anxiety    Anxiety and depression    Port-A-Cath in place 11/24/2019   Hepatic steatosis 09/28/2019   Malignant neoplasm of upper-outer quadrant of left breast in female, estrogen receptor positive (HCC) 09/02/2019   Trigger index finger of right hand 05/05/2019   Primary osteoarthritis of first carpometacarpal joint of right hand 05/05/2019   Osteoarthritis of finger of right hand 05/05/2019   Endometrial cancer (HCC) 10/23/2018   Cirrhosis of liver without ascites (HCC)  10/23/2018   Diarrhea 10/23/2018   Chest pain 02/15/2018   Coronary artery disease involving native coronary artery of native heart without angina pectoris 11/18/2017   Hyperlipidemia LDL goal <70 11/18/2017   Diabetes mellitus treated with insulin  and oral medication (HCC) 11/18/2017   Dizziness 11/18/2017   Type 2 diabetes mellitus without complication, without long-term current use of insulin  (HCC) 11/18/2017    REFERRING DIAG: left breast cancer at risk for lymphedema  THERAPY DIAG:  Aftercare following surgery for neoplasm  PERTINENT HISTORY:  Patient was diagnosed on 08/03/2019 with 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 She underwent a left lumpectomy and 1 positive node removed on 10/07/2019. Ki67 is 20%. She has a history of uterine cancer in 2014 treated with hysterectomy and radiation, a left hip replacement, right knee replacement, and a C4-C7 fusion in 2014. Diabetes, history of breast cancer (LEFT), history of endometrial cancer (with history of radiation), spine surgery C3-C7 fusion 2017, heart disease, L THA 2015, history of 3 right knee scopes and right TKA, history of inner ear issue (required epley maneuver),   PRECAUTIONS: left UE Lymphedema risk, None  SUBJECTIVE: Pt returns for her 6 month L-Dex screen.   PAIN:  Are you having pain? No  SOZO SCREENING: Patient was assessed today using the SOZO machine to determine the lymphedema index score. This was compared to her baseline score. It was determined that she is within the recommended range when compared to her baseline and no further action is needed at this time. She will continue SOZO screenings. These are done every 3 months for 2 years post operatively followed by every 6 months for 2 years, and then annually.   L-DEX FLOWSHEETS - 01/27/23 1500       L-DEX LYMPHEDEMA SCREENING   Measurement Type Unilateral    L-DEX MEASUREMENT EXTREMITY Upper Extremity    POSITION  Standing     DOMINANT SIDE Right    At Risk Side Left    BASELINE SCORE (UNILATERAL) 3.3    L-DEX SCORE (UNILATERAL) 5.1    VALUE CHANGE (UNILAT) 1.8             P: One more 6 month L-Dex screen next and then annual.   Aden Berwyn Caldron, PTA 01/27/2023, 3:18 PM

## 2023-01-27 NOTE — Progress Notes (Signed)
 Left knee x-ray shows mild arthritis.  No fractures are visible.

## 2023-01-29 NOTE — Telephone Encounter (Signed)
 [facility] and [facility] - Prior Authorization NOT required  Specialty Pharmacy ([facility]) - Prior Authorization REQUIRED

## 2023-01-30 NOTE — Telephone Encounter (Signed)
GELSYN for LEFT knee OA   Primary Insurance: [facility] Medicare Dual Complete Co-pay: n/a Co-insurance: 20% Deductible: does not apply Prior Auth: NOT required   Knee Injection History 11/20/22 - Kenalog LEFT

## 2023-01-30 NOTE — Telephone Encounter (Signed)
[facility] - Prior Authorization NOT required

## 2023-01-31 ENCOUNTER — Encounter: Payer: Self-pay | Admitting: Oncology

## 2023-01-31 ENCOUNTER — Other Ambulatory Visit: Payer: Self-pay

## 2023-01-31 ENCOUNTER — Other Ambulatory Visit (HOSPITAL_COMMUNITY): Payer: Self-pay

## 2023-01-31 ENCOUNTER — Other Ambulatory Visit (INDEPENDENT_AMBULATORY_CARE_PROVIDER_SITE_OTHER): Payer: 59

## 2023-01-31 ENCOUNTER — Ambulatory Visit (INDEPENDENT_AMBULATORY_CARE_PROVIDER_SITE_OTHER)
Admission: RE | Admit: 2023-01-31 | Discharge: 2023-01-31 | Disposition: A | Discharge status: Home / Self Care | Payer: 59 | Source: Ambulatory Visit | Attending: Physician Assistant | Admitting: Physician Assistant

## 2023-01-31 ENCOUNTER — Other Ambulatory Visit: Payer: Self-pay | Admitting: Physician Assistant

## 2023-01-31 ENCOUNTER — Encounter: Payer: Self-pay | Admitting: Physician Assistant

## 2023-01-31 ENCOUNTER — Ambulatory Visit (INDEPENDENT_AMBULATORY_CARE_PROVIDER_SITE_OTHER): Payer: 59 | Admitting: Physician Assistant

## 2023-01-31 VITALS — BP 100/60 | HR 64 | Ht 63.5 in | Wt 183.0 lb

## 2023-01-31 DIAGNOSIS — K529 Noninfective gastroenteritis and colitis, unspecified: Secondary | ICD-10-CM | POA: Diagnosis not present

## 2023-01-31 DIAGNOSIS — R32 Unspecified urinary incontinence: Secondary | ICD-10-CM

## 2023-01-31 DIAGNOSIS — R159 Full incontinence of feces: Secondary | ICD-10-CM

## 2023-01-31 DIAGNOSIS — I2510 Atherosclerotic heart disease of native coronary artery without angina pectoris: Secondary | ICD-10-CM

## 2023-01-31 DIAGNOSIS — K760 Fatty (change of) liver, not elsewhere classified: Secondary | ICD-10-CM

## 2023-01-31 DIAGNOSIS — K5900 Constipation, unspecified: Secondary | ICD-10-CM | POA: Diagnosis not present

## 2023-01-31 DIAGNOSIS — R197 Diarrhea, unspecified: Secondary | ICD-10-CM | POA: Diagnosis not present

## 2023-01-31 DIAGNOSIS — G8929 Other chronic pain: Secondary | ICD-10-CM

## 2023-01-31 DIAGNOSIS — Z96642 Presence of left artificial hip joint: Secondary | ICD-10-CM | POA: Diagnosis not present

## 2023-01-31 DIAGNOSIS — R140 Abdominal distension (gaseous): Secondary | ICD-10-CM | POA: Diagnosis not present

## 2023-01-31 DIAGNOSIS — Z7984 Long term (current) use of oral hypoglycemic drugs: Secondary | ICD-10-CM

## 2023-01-31 LAB — SEDIMENTATION RATE: Sed Rate: 24 mm/h (ref 0–30)

## 2023-01-31 LAB — TSH: TSH: 4.52 u[IU]/mL (ref 0.35–5.50)

## 2023-01-31 MED ORDER — POLYETHYLENE GLYCOL 3350 17 GM/SCOOP PO POWD
17.0000 g | Freq: Every day | ORAL | 0 refills | Status: DC
  Filled 2023-01-31: qty 238, 14d supply, fill #0
  Filled 2023-02-10: qty 238, 14d supply, fill #1

## 2023-01-31 MED ORDER — BENEFIBER PO POWD
ORAL | 0 refills | Status: DC
  Filled 2023-01-31 – 2023-02-05 (×4): qty 730, fill #0

## 2023-01-31 NOTE — Patient Instructions (Addendum)
Please give [place] a call in a month for your 3 month follow up.    Your provider has requested that you go to the basement level for lab work before leaving today. Press "B" on the elevator. The lab is located at the first door on the left as you exit the elevator.  Your provider has requested that you have an abdominal x ray before leaving today. Please go to the basement floor to our Radiology department for the test.  Miralax is an osmotic laxative.  It only brings more water into the stool.  This is safe to take daily.  Can take up to 17 gram of miralax twice a day.  Mix with juice or coffee.  Start 1 capful at night for 3-4 days and reassess your response in 3-4 days.  You can increase and decrease the dose based on your response.  Remember, it can take up to 3-4 days to take effect OR for the effects to wear off.   I often pair this with benefiber in the morning to help assure the stool is not too loose.   Here some information about pelvic floor dysfunction. This may be contributing to some of your symptoms. We will continue with our evaluation but I do want you to consider adding on fiber supplement with low-dose MiraLAX daily. We could also refer to pelvic floor physical therapy.   Pelvic Floor Dysfunction, Female Pelvic floor dysfunction (PFD) is a condition that results when the group of muscles and connective tissues that support the organs in the pelvis (pelvic floor muscles) do not work well. These muscles and their connections form a sling that supports the colon and bladder. In women, they also support the uterus. PFD causes pelvic floor muscles to be too weak, too tight, or both. In PFD, muscle movements are not coordinated. This may cause bowel or bladder problems. It may also cause pain. What are the causes? This condition may be caused by an injury to the pelvic area or by a weakening of pelvic muscles. This often results from pregnancy and childbirth or other types of  strain. In many cases, the exact cause is not known. What increases the risk? The following factors may make you more likely to develop this condition: Having chronic bladder tissue inflammation (interstitial cystitis). Being an older person. Being overweight. History of radiation treatment for cancer in the pelvic region. Previous pelvic surgery, such as removal of the uterus (hysterectomy). What are the signs or symptoms? Symptoms of this condition vary and may include: Bladder symptoms, such as: Trouble starting urination and emptying the bladder. Frequent urinary tract infections. Leaking urine when coughing, laughing, or exercising (stress incontinence). Having to pass urine urgently or frequently. Pain when passing urine. Bowel symptoms, such as: Constipation. Urgent or frequent bowel movements. Incomplete bowel movements. Painful bowel movements. Leaking stool or gas. Unexplained genital or rectal pain. Genital or rectal muscle spasms. Low back pain. Other symptoms may include: A heavy, full, or aching feeling in the vagina. A bulge that protrudes into the vagina. Pain during or after sex. How is this diagnosed? This condition may be diagnosed based on: Your symptoms and medical history. A physical exam. During the exam, your health care provider may check your pelvic muscles for tightness, spasm, pain, or weakness. This may include a rectal exam and a pelvic exam. In some cases, you may have diagnostic tests, such as: Electrical muscle function tests. Urine flow testing. X-ray tests of bowel function. Ultrasound of  the pelvic organs. How is this treated? Treatment for this condition depends on the symptoms. Treatment options include: Physical therapy. This may include Kegel exercises to help relax or strengthen the pelvic floor muscles. Biofeedback. This type of therapy provides feedback on how tight your pelvic floor muscles are so that you can learn to control  them. Internal or external massage therapy. A treatment that involves electrical stimulation of the pelvic floor muscles to help control pain (transcutaneous electrical nerve stimulation, or TENS). Sound wave therapy (ultrasound) to reduce muscle spasms. Medicines, such as: Muscle relaxants. Bladder control medicines. Surgery to reconstruct or support pelvic floor muscles may be an option if other treatments do not help. Follow these instructions at home: Activity Do your usual activities as told by your health care provider. Ask your health care provider if you should modify any activities. Do pelvic floor strengthening or relaxing exercises at home as told by your physical therapist. Lifestyle Maintain a healthy weight. Eat foods that are high in fiber, such as beans, whole grains, and fresh fruits and vegetables. Limit foods that are high in fat and processed sugars, such as fried or sweet foods. Manage stress with relaxation techniques such as yoga or meditation. General instructions If you have problems with leakage: Use absorbable pads or wear padded underwear. Wash frequently with mild soap. Keep your genital and anal area as clean and dry as possible. Ask your health care provider if you should try a barrier cream to prevent skin irritation. Take warm baths to relieve pelvic muscle tension or spasms. Take over-the-counter and prescription medicines only as told by your health care provider. Keep all follow-up visits. How is this prevented? The cause of PFD is not always known, but there are a few things you can do to reduce the risk of developing this condition, including: Staying at a healthy weight. Getting regular exercise. Managing stress. Contact a health care provider if: Your symptoms are not improving with home care. You have signs or symptoms of PFD that get worse at home. You develop new signs or symptoms. You have signs of a urinary tract infection, such  as: Fever. Chills. Increased urinary frequency. A burning feeling when urinating. You have not had a bowel movement in 3 days (constipation). Summary Pelvic floor dysfunction results when the muscles and connective tissues in your pelvic floor do not work well. These muscles and their connections form a sling that supports your colon and bladder. In women, they also support the uterus. PFD may be caused by an injury to the pelvic area or by a weakening of pelvic muscles. PFD causes pelvic floor muscles to be too weak, too tight, or a combination of both. Symptoms may vary from person to person. In most cases, PFD can be treated with physical therapies and medicines. Surgery may be an option if other treatments do not help. This information is not intended to replace advice given to you by your health care provider. Make sure you discuss any questions you have with your health care provider. Document Revised: 05/10/2020 Document Reviewed: 05/10/2020 Elsevier Patient Education  2022 [employer] Inc.  Metabolic dysfunction associated seatohepatitis  Now the leading cause of liver failure in the [place].  It is normally from such risk factors as obesity, diabetes, insulin resistance, high cholesterol, or metabolic syndrome.  The only definitive therapy is weight loss and exercise.   Suggest walking 20-30 mins daily.  Decreasing carbohydrates, increasing veggies.    Fatty Liver Fatty liver is the  accumulation of fat in liver cells. It is also called hepatosteatosis or steatohepatitis. It is normal for your liver to contain some fat. If fat is more than 5 to 10% of your liver's weight, you have fatty liver.  There are often no symptoms (problems) for years while damage is still occurring. People often learn about their fatty liver when they have medical tests for other reasons. Fat can damage your liver for years or even decades without causing problems. When it becomes severe, it can cause  fatigue, weight loss, weakness, and confusion. This makes you more likely to develop more serious liver problems. The liver is the largest organ in the body. It does a lot of work and often gives no warning signs when it is sick until late in a disease. The liver has many important jobs including: Breaking down foods. Storing vitamins, iron, and other minerals. Making proteins. Making bile for food digestion. Breaking down many products including medications, alcohol and some poisons.  PROGNOSIS  Fatty liver may cause no damage or it can lead to an inflammation of the liver. This is, called steatohepatitis.  Over time the liver may become scarred and hardened. This condition is called cirrhosis. Cirrhosis is serious and may lead to liver failure or cancer. NASH is one of the leading causes of cirrhosis. About 10-20% of Americans have fatty liver and a smaller 2-5% has NASH.  TREATMENT  Weight loss, fat restriction, and exercise in overweight patients produces inconsistent results but is worth trying. Good control of diabetes may reduce fatty liver. Eat a balanced, healthy diet. Increase your physical activity. There are no medical or surgical treatments for a fatty liver or NASH, but improving your diet and increasing your exercise may help prevent or reverse some of the damage.

## 2023-01-31 NOTE — Progress Notes (Signed)
01/31/2023 Felicia [name] [id number] 1951/01/10  Referring provider: Allwardt, Crist Infante, PA-C Primary GI doctor: Dr. Carloyn Manner (Dr. Christella Hartigan)  ASSESSMENT AND PLAN:   Diarrhea chronic with fecal incontinence  Has been since her chemo/radiation for breast cancer 3 years ago, worse a year ago  can have days without BM Alternating hard/loose stools Not concerned for infection with longevity Has incomplete BM, has urinary incontinence No hematochezia, no AB pain, no fever chills Likely over flow with pelvic exam with pelvic floor dysfunction, will check pancreatic elastase with DM history, check labs, check sed rate Add fiber, add miralax, pelvic floor PT Patient is due for colonoscopy 12/2023, will consider getting sooner pending results Follow up 2-3 months.   Fecal incontinence Small to moderate with urgency, soft stools Has had vaginal radiation in the past Has urinary urgency Likely pelvic floor dysfunction Will send to PT pelvic floor, add on fiber 2-3 x a day Consider anal rectal manometry/referral to evaluation/surgery  Fatty liver Fib 4 0.75 low risk fibrosis - need LFTs and CBC monitored every 6 months, revaluation every 2-3 years.  - Consider elastography --Continue to work on risk factor modification including diet exercise and control of risk factors including blood sugars.   Patient Care Team: Allwardt, Crist Infante, PA-C as PCP - General (Physician Assistant) Nahser, Deloris Ping, MD as PCP - Cardiology (Cardiology) Emelia Loron, MD as Consulting Physician (General Surgery) Lonie Peak, MD as Attending Physician (Radiation Oncology) Zenovia Jordan, MD as Consulting Physician (Rheumatology) Betha Loa, MD as Consulting Physician (Orthopedic Surgery) Marlene Lard, MD as Consulting Physician (Endocrinology) Luciano Cutter, MD as Consulting Physician (Pulmonary Disease) Serena Croissant, MD as Consulting Physician (Hematology and Oncology) Erroll Luna, [facility] (Inactive) as Pharmacist (Pharmacist) Fleeta Emmer, RN as [facility] Care Management  HISTORY OF PRESENT ILLNESS: 73 y.o. female with a past medical history of invasive mammary carcinoma left diagnosed 2021 status post chemotherapy and lumpectomy with radiation in 2022 on anastrozole follows with Dr. Pamelia Hoit, CAD s/p DES stent 2018 on bASA, emphysema not on oxygen, hepatic steatosis/cirrhosis, type 2 diabetes on insulin and others listed below presents for evaluation of diarrhea and fecal incontinence.   Patient previous colonoscopy [facility] with 1 hepatic flexure polyp otherwise unremarkable, 12/2018 Previously seen Dr. Christella Hartigan in 2020 for elevated LFTs thought to be transient secondary to cholesterol medication had hepatic steatosis that time. Recent CT scan July 2023 for breast cancer staging showed hepatic steatosis but no signs of portal hypertension. 12/18/2022 WBC 12.7, hemoglobin 14.7, platelets normal 341, normal kidney function, normal liver function, most recent A1c 8.3, normal thyroid at 0.81  Discussed the use of AI scribe software for clinical note transcription with the patient, who gave verbal consent to proceed.  History of Present Illness   The patient, with a past medical history of breast cancer, endometrial cancer, and diabetes, presents with a chief complaint of chronic diarrhea. The diarrhea has been ongoing for over two to three years and has worsened over the past year. The patient reports having bowel movements every day, sometimes more than once a day. The consistency of the stool varies, sometimes being hard and large, other times being soft and mushy. The patient also reports a feeling of incomplete bowel movements.  In addition to the diarrhea, the patient also reports urinary incontiness, particularly in the morning. The patient describes an urgency to urinate and an inability to hold it in. The patient does not wake up  in the middle of the night to urinate.  The patient has a history of radiation therapy for endometrial cancer and breast cancer. The patient is currently on metformin for diabetes and anastrozole for breast cancer. The patient also reports taking extra iron pills due to a history of low iron levels.      She  reports that she has never smoked. She has never used smokeless tobacco. She reports current alcohol use. She reports that she does not use drugs.  RELEVANT GI HISTORY, LABS, IMAGING: 08/09/2021 CT chest abdomen pelvis with contrast for cancer staging 1. Status post left lumpectomy and axillary lymph node dissection, with a small unchanged seromatous fluid collection in the left breast. 2. Status post hysterectomy. 3. No evidence of lymphadenopathy or metastatic disease within the chest, abdomen, or pelvis. 4. Previously seen acute airspace disease on examination dated 01/10/2021 is resolved. 5. Hepatic steatosis. 6. Coronary artery disease.  12/2018 colonoscopy  with good [facility] 1 polyp removed from hepatic flexure otherwise normal colonoscopy PATH TA polyps, recall 5 years CBC    Component Value Date/Time   WBC 12.7 (H) 12/18/2022 1510   RBC 4.68 12/18/2022 1510   HGB 14.7 12/18/2022 1510   HGB 13.8 08/02/2021 1036   HCT 44.7 12/18/2022 1510   PLT 341.0 12/18/2022 1510   PLT 216 08/02/2021 1036   MCV 95.4 12/18/2022 1510   MCH 31.8 08/02/2021 1036   MCHC 32.9 12/18/2022 1510   RDW 14.9 12/18/2022 1510   LYMPHSABS 8.6 (H) 12/18/2022 1510   MONOABS 0.6 12/18/2022 1510   EOSABS 0.1 12/18/2022 1510   BASOSABS 0.1 12/18/2022 1510   Recent Labs    03/26/22 1135 06/06/22 1408 12/18/22 1510  HGB 15.0 14.7 14.7    CMP     Component Value Date/Time   NA 139 12/18/2022 1510   NA 140 08/06/2022 1154   K 4.5 12/18/2022 1510   CL 103 12/18/2022 1510   CO2 26 12/18/2022 1510   GLUCOSE 166 (H) 12/18/2022 1510   BUN 23 12/18/2022 1510    BUN 23 08/06/2022 1154   CREATININE 0.78 12/18/2022 1510   CREATININE 0.83 08/02/2021 1036   CALCIUM 10.4 12/18/2022 1510   PROT 7.2 12/18/2022 1510   PROT 7.1 11/25/2018 1053   ALBUMIN 4.4 12/18/2022 1510   ALBUMIN 4.2 11/25/2018 1053   AST 16 12/18/2022 1510   AST 31 08/02/2021 1036   ALT 21 12/18/2022 1510   ALT 36 08/02/2021 1036   ALKPHOS 41 12/18/2022 1510   BILITOT 0.3 12/18/2022 1510   BILITOT 0.3 08/02/2021 1036   GFRNONAA >60 08/02/2021 1036   GFRAA >60 10/04/2019 1330   GFRAA >60 09/08/2019 0811      Latest Ref Rng & Units 12/18/2022    3:10 PM 11/14/2022   11:41 AM 08/06/2022   11:54 AM  Hepatic Function  Total Protein 6.0 - 8.3 g/dL 7.2     Albumin 3.5 - 5.2 g/dL 4.4     AST 0 - 37 U/L 16     ALT 0 - 35 U/L 21  21  26    Alk Phosphatase 39 - 117 U/L 41     Total Bilirubin 0.2 - 1.2 mg/dL 0.3         Current Medications:   Current Outpatient Medications (Endocrine & Metabolic):    empagliflozin (JARDIANCE) 25 MG TABS tablet, Take 1 tablet (25 mg total) by mouth daily.   glipiZIDE (GLUCOTROL XL) 10 MG  24 hr tablet, Take 2 tablets (20 mg total) by mouth every morning.   insulin glargine (LANTUS SOLOSTAR) 100 UNIT/ML Solostar Pen, Start with injecting 4 units under the skin in the evenings. Increase by 2 units every 2 days until monring fasting glucose is to goal (140). Max units 60 per day.   levothyroxine (SYNTHROID) 125 MCG tablet, Take 1 tablet (125 mcg total) by mouth daily before breakfast.   metFORMIN (GLUCOPHAGE) 500 MG tablet, Take 2 tablets (1,000 mg total) by mouth 2 (two) times daily.  Current Outpatient Medications (Cardiovascular):    bisoprolol (ZEBETA) 5 MG tablet, Take 1 tablet (5 mg total) by mouth at bedtime.   fenofibrate (TRICOR) 145 MG tablet, Take 1 tablet (145 mg total) by mouth at bedtime.   nitroGLYCERIN (NITROSTAT) 0.4 MG SL tablet, Dissolve 1 tablet under the tongue every 5 minutes as needed for chest pain. Max of 3 doses, then 911.  (Patient not taking: Reported on 01/31/2023)  Current Outpatient Medications (Respiratory):    albuterol (VENTOLIN HFA) 108 (90 Base) MCG/ACT inhaler, Inhale 2 puffs into the lungs every 6 (six) hours as needed for wheezing or shortness of breath.   Tiotropium Bromide-Olodaterol (STIOLTO RESPIMAT) 2.5-2.5 MCG/ACT AERS, Inhale 2 puffs into the lungs daily.  Current Outpatient Medications (Analgesics):    aspirin EC (ASPIRIN 81) 81 MG tablet, Take 1 tablet (81 mg total) by mouth in the morning.   celecoxib (CELEBREX) 200 MG capsule, Take 1 capsule (200 mg total) by mouth 2 (two) times daily.   HYDROcodone-acetaminophen (NORCO/VICODIN) 5-325 MG tablet, Take 1 tablet by mouth every 6 (six) hours as needed.   traMADol (ULTRAM) 50 MG tablet, Take 1 tablet (50 mg total) by mouth every 6 (six) hours as needed for moderate pain (pain score 4-6) or severe pain (pain score 7-10).  Current Outpatient Medications (Hematological):    Ferrous Sulfate (IRON PO), Take 1 tablet by mouth daily. alternates days:1 tablet one day and 2 tablets the next day  Current Outpatient Medications (Other):    ACCU-CHEK GUIDE test strip, USE TO check blood glucose UP TO four times daily AS DIRECTED   Accu-Chek Softclix Lancets lancets, USE TO check blood glucose UP TO four times daily AS DIRECTED   anastrozole (ARIMIDEX) 1 MG tablet, Take 1 tablet (1 mg total) by mouth daily.   Ascorbic Acid (VITAMIN C PO), Take 1 tablet by mouth daily.   blood glucose meter kit and supplies, Dispense based on patient and insurance preference. Use up to four times daily as directed. (FOR ICD-10 E10.9, E11.9).   buPROPion (WELLBUTRIN XL) 150 MG 24 hr tablet, Take 1 tablet (150 mg total) by mouth every morning for depression.   CINNAMON PO, Take 1 capsule by mouth daily with supper.   diclofenac sodium (VOLTAREN) 1 % GEL, Apply 2-4 g topically 4 (four) times daily as needed (as directed for pain).   escitalopram (LEXAPRO) 20 MG tablet, Take 1  tablet (20 mg total) by mouth daily.   gabapentin (NEURONTIN) 600 MG tablet, Take 1 tablet (600 mg total) by mouth 3 (three) times daily.   glucose blood test strip, Use to check blood sugar up to four times daily as directed   Incontinence Supply Disposable (DEPEND ADJUSTABLE UNDERWEAR) MISC, 1 Application by Does not apply route daily.   Insulin Pen Needle (PEN NEEDLES) 32G X 4 MM MISC, USE TO INJECT INSULIN   Lancets MISC, Use to check blood sugar up to four times daily as directed  metroNIDAZOLE (METROGEL) 1 % gel, Apply 1 application topically as needed (as directed to affected area).   Multiple Vitamins-Calcium (ONE-A-DAY WOMENS PO), Take 1 tablet by mouth daily.   omeprazole (PRILOSEC) 40 MG capsule, Take 1 capsule (40 mg total) by mouth every evening.   polyethylene glycol powder (MIRALAX) 17 GM/SCOOP powder, Take 17 g by mouth daily.   rOPINIRole (REQUIP) 0.5 MG tablet, Take 1 tablet (0.5 mg total) by mouth at bedtime.   tiZANidine (ZANAFLEX) 2 MG tablet, Take 1-2 tablets (2-4 mg total) by mouth at bedtime as needed.   triamcinolone (KENALOG) 0.1 % paste, Apply a small amount to affected area twice daily for 2 - 3 weeks.   TURMERIC PO, Take 1 capsule by mouth daily with supper.   Wheat Dextrin (BENEFIBER) POWD, 1-2 TBSP work up to 3 x a day, mix in water or liquid  Medical History:  Past Medical History:  Diagnosis Date   Adenomatous colon polyp 2015   Anemia    Anxiety    Breast cancer (HCC) 09/2019   left breast IMC   CAD (coronary artery disease)    a. 3/2007s/p DES to the [facility]); b. 01/2017 MV: EF 80%, small, mild apical ant defect w/o ischemia (felt to be breast atten). Low risk.   Depression    DM (diabetes mellitus) (HCC)    Emphysema lung (HCC)    Essential hypertension    GERD (gastroesophageal reflux disease)    Headache    secondary to neck surgery per patient   High triglycerides    HTN (hypertension)    Hyperlipidemia    Hypothyroid    OA  (osteoarthritis)    right knee,hands   Overflow incontinence    PONV (postoperative nausea and vomiting)    s/p gallbladder    RLS (restless legs syndrome)    Urinary urgency    Uterine cancer (HCC) dx'd 2014   Allergies:  Allergies  Allergen Reactions   Chloraprep One Step [Chlorhexidine Gluconate] Rash   Estrogens Other (See Comments)    PATIENT HAS A HISTORY OF CANCER AND HAS BEEN TOLD TO NEVER TAKE ANYTHING CONTAINING ESTROGEN, AS IT MIGHT CAUSE A RECURRENCE   Shellfish-Derived Products Nausea And Vomiting   Shrimp [Shellfish Allergy] Nausea And Vomiting     Surgical History:  She  has a past surgical history that includes Coronary/Graft Acute MI Revascularization; Replacement total knee (Right); Coronary stent placement; Total hip arthroplasty (Left, 2015); Spine surgery; Foot surgery (Left); Total abdominal hysterectomy; LEFT HEART CATH AND CORONARY ANGIOGRAPHY (N/A, 02/16/2018); Finger arthodesis (Right, 06/11/2019); Breast lumpectomy with radioactive seed and sentinel lymph node biopsy (Left, 10/07/2019); Portacath placement (Right, 11/23/2019); LEFT HEART CATH AND CORONARY ANGIOGRAPHY (N/A, 05/01/2020); and Cholecystectomy. Family History:  Her family history includes AAA (abdominal aortic aneurysm) in her mother; Alzheimer's disease in her father; Arthritis in her sister; Breast cancer in her cousin; Diabetes in her brother, brother, father, and sister; Heart disease in her sister; Kidney failure in her sister; Throat cancer in her sister.  REVIEW OF SYSTEMS  : All other systems reviewed and negative except where noted in the History of Present Illness.  PHYSICAL EXAM: BP 100/60 (BP Location: Right Arm, Patient Position: Sitting, Cuff Size: Normal)   Pulse 64   Ht 5' 3.5" (1.613 m) Comment: height measired without shoes  Wt 183 lb (83 kg)   BMI 31.91 kg/m  General Appearance: Well nourished, in no apparent distress. Head:   Normocephalic and atraumatic. Eyes:  sclerae  anicteric,conjunctive pink  Respiratory: Respiratory effort normal, BS equal bilaterally without rales, rhonchi, wheezing. Cardio: RRR with no MRGs. Peripheral pulses intact.  Abdomen: Soft,  Obese ,active bowel sounds. mild tenderness in the lower abdomen. Without guarding and Without rebound. No masses. Rectal: Posterior hemorrhoidal skin tag, otherwise no external abnormality other than brown stool leakage, grossly decreased rectal tone on exam with some soft and hard brown stool in the cavernous rectal vault, negative Hemoccult. Musculoskeletal: Full ROM, Antalgic gait. With edema. Skin:  Dry and intact without significant lesions or rashes Neuro: Alert and  oriented x4;  No focal deficits. Psych:  Cooperative. Normal mood and affect.    Doree Albee, PA-C 3:45 PM

## 2023-01-31 NOTE — Telephone Encounter (Signed)
Last OV: 12/31/22  Next OV: 03/18/23  Last Filled: 12/31/22  Quantity: 30

## 2023-02-02 ENCOUNTER — Encounter: Payer: Self-pay | Admitting: Physician Assistant

## 2023-02-02 MED ORDER — TRAMADOL HCL 50 MG PO TABS
50.0000 mg | ORAL_TABLET | Freq: Four times a day (QID) | ORAL | 0 refills | Status: DC | PRN
  Filled 2023-02-02: qty 30, 8d supply, fill #0

## 2023-02-03 ENCOUNTER — Encounter (HOSPITAL_BASED_OUTPATIENT_CLINIC_OR_DEPARTMENT_OTHER): Payer: Self-pay | Admitting: Pulmonary Disease

## 2023-02-03 ENCOUNTER — Ambulatory Visit (HOSPITAL_BASED_OUTPATIENT_CLINIC_OR_DEPARTMENT_OTHER): Payer: 59 | Admitting: Pulmonary Disease

## 2023-02-03 ENCOUNTER — Other Ambulatory Visit: Payer: Self-pay

## 2023-02-03 ENCOUNTER — Other Ambulatory Visit (HOSPITAL_COMMUNITY): Payer: Self-pay

## 2023-02-03 VITALS — BP 128/72 | HR 64 | Ht 63.5 in | Wt 183.0 lb

## 2023-02-03 DIAGNOSIS — J432 Centrilobular emphysema: Secondary | ICD-10-CM | POA: Diagnosis not present

## 2023-02-03 DIAGNOSIS — J42 Unspecified chronic bronchitis: Secondary | ICD-10-CM

## 2023-02-03 LAB — TISSUE TRANSGLUTAMINASE, IGA: (tTG) Ab, IgA: <1 U/mL

## 2023-02-03 LAB — IGA: Immunoglobulin A: 110 mg/dL (ref 70–320)

## 2023-02-03 MED ORDER — ALBUTEROL SULFATE HFA 108 (90 BASE) MCG/ACT IN AERS
2.0000 | INHALATION_SPRAY | Freq: Four times a day (QID) | RESPIRATORY_TRACT | 2 refills | Status: AC | PRN
  Filled 2023-02-03: qty 6.7, 25d supply, fill #0

## 2023-02-03 MED ORDER — STIOLTO RESPIMAT 2.5-2.5 MCG/ACT IN AERS
2.0000 | INHALATION_SPRAY | Freq: Every day | RESPIRATORY_TRACT | 2 refills | Status: DC
  Filled 2023-02-03: qty 4, 30d supply, fill #0
  Filled 2023-02-27: qty 4, 30d supply, fill #1
  Filled 2023-03-29: qty 4, 30d supply, fill #2

## 2023-02-03 NOTE — Patient Instructions (Signed)
  Emphysema - intermittent, worsening in last month --RESTART Stiolto TWO puffs ONCE a day --CONTINUE Albuterol TWO puffs AS NEEDED for shortness of breath or wheezing.  --CONTINUE mucinex as needed for chest congestion

## 2023-02-03 NOTE — Progress Notes (Signed)
Subjective:   PATIENT ID: Felicia [name] GENDER: female DOB: [birth year], MRN: [id number]   HPI  Chief Complaint  Patient presents with   Emphysema   Reason for Visit: Follow-up  Felicia [name] is a 73 year old female never smoker with left breast cancer s/p radiation, CAD, DM and HTN who presents for follow-up  Synopsis: 2022 S/p left breast radiation and developed SOB/ left chest pain. Emphysema on CT. Started on Advair. Self discontinued Advair after improved sx however restarted in Nov but feels ineffective. Started on Spiriva 2023 Breo added on to Spiriva in Jan for wheezing and "lung pain" which improved by 90%. Changed to [facility] in June due to hoarseness  05/03/22 Since our last visit she is not using inhalers regularly. Overall she feels well off inhalers except when triggered by environmental factors including pollen. Will intermittently use Stiolto as needed on her bad days when she has chest tightness. Never uses the albuterol inhaler. No exacerbations requiring steroids or antibiotics.   02/03/23 Since our last visit she reports she is doing well overall. Has been doing well until the last three weeks when she started using albuterol daily for cough and shortness of breath. No wheezing.  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  Past Medical History:  Diagnosis Date   Adenomatous colon polyp 2015   Anemia    Anxiety    Breast cancer (HCC) 09/2019   left breast IMC   CAD (coronary artery disease)    a. 3/2007s/p DES to the LAD [facility]); b. 01/2017 MV: EF 80%, small, mild apical ant defect w/o ischemia (felt to be breast atten). Low risk.   Depression    DM  (diabetes mellitus) (HCC)    Emphysema lung (HCC)    Essential hypertension    GERD (gastroesophageal reflux disease)    Headache    secondary to neck surgery per patient   High triglycerides    HTN (hypertension)    Hyperlipidemia    Hypothyroid    OA (osteoarthritis)    right knee,hands   Overflow incontinence    PONV (postoperative nausea and vomiting)    s/p gallbladder    RLS (restless legs syndrome)    Urinary urgency    Uterine cancer (HCC) dx'd 2014     Family History  Problem Relation Age of Onset   AAA (abdominal aortic aneurysm) Mother    Diabetes Father    Alzheimer's disease Father    Arthritis Sister    Heart disease Sister    Throat cancer Sister    Kidney failure Sister    Diabetes Sister    Diabetes Brother    Diabetes Brother    Breast cancer Cousin      Social History   Occupational History   Occupation: retired  Tobacco Use   Smoking status: Never   Smokeless tobacco: Never  Vaping Use   Vaping status: Never Used  Substance and Sexual Activity   Alcohol use: Yes    Comment: OCCASIONALLY   Drug use: No   Sexual activity: Not Currently    Birth control/protection: Surgical    Comment: hyst    Allergies  Allergen Reactions  Chloraprep One Step [Chlorhexidine Gluconate] Rash   Estrogens Other (See Comments)    PATIENT HAS A HISTORY OF CANCER AND HAS BEEN TOLD TO NEVER TAKE ANYTHING CONTAINING ESTROGEN, AS IT MIGHT CAUSE A RECURRENCE   Shellfish-Derived Products Nausea And Vomiting   Shrimp [Shellfish Allergy] Nausea And Vomiting     Outpatient Medications Prior to Visit  Medication Sig Dispense Refill   ACCU-CHEK GUIDE test strip USE TO check blood glucose UP TO four times daily AS DIRECTED 100 strip 0   Accu-Chek Softclix Lancets lancets USE TO check blood glucose UP TO four times daily AS DIRECTED 100 each 0   anastrozole (ARIMIDEX) 1 MG tablet Take 1 tablet (1 mg total) by mouth daily. 90 tablet 4   Ascorbic Acid (VITAMIN C PO)  Take 1 tablet by mouth daily.     aspirin EC (ASPIRIN 81) 81 MG tablet Take 1 tablet (81 mg total) by mouth in the morning. 90 tablet 99   bisoprolol (ZEBETA) 5 MG tablet Take 1 tablet (5 mg total) by mouth at bedtime. 90 tablet 3   blood glucose meter kit and supplies Dispense based on patient and insurance preference. Use up to four times daily as directed. (FOR ICD-10 E10.9, E11.9). 1 each 0   buPROPion (WELLBUTRIN XL) 150 MG 24 hr tablet Take 1 tablet (150 mg total) by mouth every morning for depression. 30 tablet 1   celecoxib (CELEBREX) 200 MG capsule Take 1 capsule (200 mg total) by mouth 2 (two) times daily. 180 capsule 1   CINNAMON PO Take 1 capsule by mouth daily with supper.     diclofenac sodium (VOLTAREN) 1 % GEL Apply 2-4 g topically 4 (four) times daily as needed (as directed for pain).     empagliflozin (JARDIANCE) 25 MG TABS tablet Take 1 tablet (25 mg total) by mouth daily. 90 tablet 1   escitalopram (LEXAPRO) 20 MG tablet Take 1 tablet (20 mg total) by mouth daily. 90 tablet 1   fenofibrate (TRICOR) 145 MG tablet Take 1 tablet (145 mg total) by mouth at bedtime. 90 tablet 3   Ferrous Sulfate (IRON PO) Take 1 tablet by mouth daily. alternates days:1 tablet one day and 2 tablets the next day     gabapentin (NEURONTIN) 600 MG tablet Take 1 tablet (600 mg total) by mouth 3 (three) times daily. 90 tablet 5   glipiZIDE (GLUCOTROL XL) 10 MG 24 hr tablet Take 2 tablets (20 mg total) by mouth every morning. 90 tablet 1   glucose blood test strip Use to check blood sugar up to four times daily as directed 100 each 3   HYDROcodone-acetaminophen (NORCO/VICODIN) 5-325 MG tablet Take 1 tablet by mouth every 6 (six) hours as needed. 15 tablet 0   Incontinence Supply Disposable (DEPEND ADJUSTABLE UNDERWEAR) MISC 1 Application by Does not apply route daily. 16 each 11   insulin glargine (LANTUS SOLOSTAR) 100 UNIT/ML Solostar Pen Start with injecting 4 units under the skin in the evenings. Increase  by 2 units every 2 days until monring fasting glucose is to goal (140). Max units 60 per day. 15 mL 11   Insulin Pen Needle (PEN NEEDLES) 32G X 4 MM MISC USE TO INJECT INSULIN 100 each 3   Lancets MISC Use to check blood sugar up to four times daily as directed 100 each 3   levothyroxine (SYNTHROID) 125 MCG tablet Take 1 tablet (125 mcg total) by mouth daily before breakfast. 30 tablet 2   metFORMIN (GLUCOPHAGE)  500 MG tablet Take 2 tablets (1,000 mg total) by mouth 2 (two) times daily. 270 tablet 1   metroNIDAZOLE (METROGEL) 1 % gel Apply 1 application topically as needed (as directed to affected area). 45 g 2   Multiple Vitamins-Calcium (ONE-A-DAY WOMENS PO) Take 1 tablet by mouth daily.     nitroGLYCERIN (NITROSTAT) 0.4 MG SL tablet Dissolve 1 tablet under the tongue every 5 minutes as needed for chest pain. Max of 3 doses, then 911. 75 tablet 2   omeprazole (PRILOSEC) 40 MG capsule Take 1 capsule (40 mg total) by mouth every evening. 90 capsule 1   polyethylene glycol powder (MIRALAX) 17 GM/SCOOP powder Take 17 g by mouth daily. 255 g 0   rOPINIRole (REQUIP) 0.5 MG tablet Take 1 tablet (0.5 mg total) by mouth at bedtime. 90 tablet 1   tiZANidine (ZANAFLEX) 2 MG tablet Take 1-2 tablets (2-4 mg total) by mouth at bedtime as needed. 60 tablet 12   traMADol (ULTRAM) 50 MG tablet Take 1 tablet (50 mg total) by mouth every 6 (six) hours as needed for moderate pain (pain score 4-6) or severe pain (pain score 7-10). 30 tablet 0   triamcinolone (KENALOG) 0.1 % paste Apply a small amount to affected area twice daily for 2 - 3 weeks. 5 g 1   TURMERIC PO Take 1 capsule by mouth daily with supper.     Wheat Dextrin (BENEFIBER) POWD 1-2 TBSP work up to 3 x a day, mix in water or liquid 730 g 0   albuterol (VENTOLIN HFA) 108 (90 Base) MCG/ACT inhaler Inhale 2 puffs into the lungs every 6 (six) hours as needed for wheezing or shortness of breath. 8.5 g 2   Tiotropium Bromide-Olodaterol (STIOLTO RESPIMAT)  2.5-2.5 MCG/ACT AERS Inhale 2 puffs into the lungs daily. 4 g 5   No facility-administered medications prior to visit.    Review of Systems  Constitutional:  Negative for chills, diaphoresis, fever, malaise/fatigue and weight loss.  HENT:  Negative for congestion.   Respiratory:  Positive for cough and shortness of breath. Negative for hemoptysis, sputum production and wheezing.   Cardiovascular:  Negative for chest pain, palpitations and leg swelling.     Objective:   Vitals:   02/03/23 1352  BP: 128/72  Pulse: 64  SpO2: 96%  Weight: 183 lb (83 kg)  Height: 5' 3.5" (1.613 m)      SpO2: 96 % Physical Exam: General: Well-appearing, no acute distress HENT: [place], AT Eyes: EOMI, no scleral icterus Respiratory: Clear to auscultation bilaterally.  No crackles, wheezing or rales Cardiovascular: RRR, -M/R/G, no JVD Extremities:-Edema,-tenderness Neuro: AAO x4, CNII-XII grossly intact Psych: Normal mood, normal affect   Data Reviewed:  Imaging: CTA 04/29/20 - No pulmonary emboli. Dependent bibasilar atelectasis. Diffuse emphysema with mosaic attenuation CT Chest 01/11/21 - Multifocal reticulonodular opacities CT CAP 08/09/21 - Visualized parenchymal resolution of opacities with minimal subpleural radiation fibrosis in LUL  PFT: 08/10/20 FVC 2.61 (87%) FEV1 2.39 (105%) Ratio 87  TLC 88% DLCO 117% Interpretation: Normal spirometry. Increased DLCO suggestive of asthma No significant bronchodilator response  Labs: CBC    Component Value Date/Time   WBC 12.7 (H) 12/18/2022 1510   RBC 4.68 12/18/2022 1510   HGB 14.7 12/18/2022 1510   HGB 13.8 08/02/2021 1036   HCT 44.7 12/18/2022 1510   PLT 341.0 12/18/2022 1510   PLT 216 08/02/2021 1036   MCV 95.4 12/18/2022 1510   MCH 31.8 08/02/2021 1036   MCHC 32.9 12/18/2022 1510  RDW 14.9 12/18/2022 1510   LYMPHSABS 8.6 (H) 12/18/2022 1510   MONOABS 0.6 12/18/2022 1510   EOSABS 0.1 12/18/2022 1510   BASOSABS 0.1 12/18/2022 1510    Echocardiogram 11/27/20 EF 60-65%. No valvular or WMA  Assessment & Plan:   Discussion: 73 year old female never smoker with hx CAD s/p stent to LAD, hx breast cancer s/p left sided radiation completed in 04/2020 who presents for follow-up emphysema +/- occupational asthma +/- radiation pneumonitis. Symptoms worsened seasonally. Using SABA PRN. Previously did well on LAMA/LABA and would step up to this if needed, which would be appropriate.  ICS - hoarseness Stiolto - no issues but d/c'd when symptoms improved  Emphysema - intermittent, worsening in last month --RESTART Stiolto TWO puffs ONCE a day --CONTINUE Albuterol TWO puffs AS NEEDED for shortness of breath or wheezing.  --CONTINUE mucinex as needed for chest congestion   Health Maintenance Immunization History  Administered Date(s) Administered   Fluad Quad(high Dose 65+) 09/25/2021   Fluad Trivalent(High Dose 65+) 10/23/2022   Influenza, High Dose Seasonal PF 12/31/2016, 10/24/2017   PNEUMOCOCCAL CONJUGATE-20 09/25/2021   Pneumococcal Conjugate-13 10/24/2017   Td 07/12/2005, 02/08/2019   CT Lung Screen - not indicated  No orders of the defined types were placed in this encounter.  Meds ordered this encounter  Medications   albuterol (VENTOLIN HFA) 108 (90 Base) MCG/ACT inhaler    Sig: Inhale 2 puffs into the lungs every 6 (six) hours as needed for wheezing or shortness of breath.    Dispense:  8.5 g    Refill:  2   Tiotropium Bromide-Olodaterol (STIOLTO RESPIMAT) 2.5-2.5 MCG/ACT AERS    Sig: Inhale 2 puffs into the lungs daily.    Dispense:  4 g    Refill:  2   Return in about 3 months (around 05/04/2023) for early April.   I have spent a total time of 30-minutes on the day of the appointment including chart review, data review, collecting history, coordinating care and discussing medical diagnosis and plan with the patient/family. Past medical history, allergies, medications were reviewed. Pertinent imaging, labs  and tests included in this note have been reviewed and interpreted independently by me.  Lillyanna Glandon Mechele Collin, MD [facility] 02/03/2023

## 2023-02-04 ENCOUNTER — Ambulatory Visit: Payer: Self-pay

## 2023-02-04 ENCOUNTER — Other Ambulatory Visit (HOSPITAL_COMMUNITY): Payer: Self-pay

## 2023-02-04 NOTE — Patient Outreach (Signed)
  Care Coordination   Follow Up Visit Note   02/04/2023 Name: Felicia [name] MRN: [id number] DOB: 1951-01-13  Felicia [name] is a 73 y.o. year old female who sees Allwardt, Crist Infante, PA-C for primary care. I spoke with  Felicia [name] by phone today.  What matters to the patients health and wellness today?  Maintain health    Goals Addressed             This Visit's Progress    Diabetes Management       Patient Goals/Self Care Activities: -Patient/Caregiver will self-administer medications as prescribed as evidenced by self-report/primary caregiver report  -Patient/Caregiver will attend all scheduled provider appointments as evidenced by clinician review of documented attendance to scheduled appointments and patient/caregiver report -Patient/Caregiver will call provider office for new concerns or questions as evidenced by review of documented incoming telephone call notes and patient report  -check blood sugar at prescribed times -check blood sugar if I feel it is too high or too low -record values and write them down take them to all doctor visits    Patient reports  doing okay.  Left knee bothering her from fall.  It is too early for cortisone injection.  Taking pain medication and using warmth and rest.  Blood sugar up and down depending what she eats blood sugar this am 184 and yesterday 130.  Encouraged continued diabetes management.  On melas on wheels list. No concerns.          SDOH assessments and interventions completed:  Yes     Care Coordination Interventions:  Yes, provided   Follow up plan: Follow up call scheduled for February    Encounter Outcome:  Patient Visit Completed   Bary Leriche RN, MSN [facility], [facility] RN Care Manager Direct Dial: [phone]  Fax: [phone] Website: [name]

## 2023-02-04 NOTE — Telephone Encounter (Signed)
Scheduled

## 2023-02-04 NOTE — Patient Instructions (Signed)
Visit Information  Thank you for taking time to visit with me today. Please don't hesitate to contact me if I can be of assistance to you.   Following are the goals we discussed today:   Goals Addressed             This Visit's Progress    Diabetes Management       Patient Goals/Self Care Activities: -Patient/Caregiver will self-administer medications as prescribed as evidenced by self-report/primary caregiver report  -Patient/Caregiver will attend all scheduled provider appointments as evidenced by clinician review of documented attendance to scheduled appointments and patient/caregiver report -Patient/Caregiver will call provider office for new concerns or questions as evidenced by review of documented incoming telephone call notes and patient report  -check blood sugar at prescribed times -check blood sugar if I feel it is too high or too low -record values and write them down take them to all doctor visits    Patient reports  doing okay.  Left knee bothering her from fall.  It is too early for cortisone injection.  Taking pain medication and using warmth and rest.  Blood sugar up and down depending what she eats blood sugar this am 184 and yesterday 130.  Encouraged continued diabetes management.  On melas on wheels list. No concerns.          Our next appointment is by telephone on 03/04/23 at 1030 am  Please call the care guide team at [phone] if you need to cancel or reschedule your appointment.   If you are experiencing a Mental Health or Behavioral Health Crisis or need someone to talk to, please call the Suicide and Crisis Lifeline: 988   Patient verbalizes understanding of instructions and care plan provided today and agrees to view in MyChart. Active MyChart status and patient understanding of how to access instructions and care plan via MyChart confirmed with patient.     The patient has been provided with contact information for the care management team and has  been advised to call with any health related questions or concerns.   Bary Leriche RN, MSN [facility], [facility] RN Care Manager Direct Dial: [phone]  Fax: [phone] Website: [name]

## 2023-02-05 ENCOUNTER — Other Ambulatory Visit (HOSPITAL_COMMUNITY): Payer: Self-pay

## 2023-02-05 NOTE — Telephone Encounter (Signed)
Prior Authorization initiated for [facility] via [email or web address] KEY: [id number]

## 2023-02-06 ENCOUNTER — Other Ambulatory Visit (HOSPITAL_COMMUNITY): Payer: Self-pay

## 2023-02-06 ENCOUNTER — Other Ambulatory Visit: Payer: Self-pay | Admitting: Physician Assistant

## 2023-02-06 ENCOUNTER — Other Ambulatory Visit: Payer: Self-pay | Admitting: Pulmonary Disease

## 2023-02-06 NOTE — Telephone Encounter (Signed)
Prior Authorization for [facility] for LEFT knee OA APPROVED PA# [id number] Valid: 02/05/23-01/14/24

## 2023-02-07 ENCOUNTER — Other Ambulatory Visit (HOSPITAL_COMMUNITY): Payer: Self-pay

## 2023-02-07 ENCOUNTER — Other Ambulatory Visit: Payer: 59

## 2023-02-07 ENCOUNTER — Other Ambulatory Visit: Payer: Self-pay

## 2023-02-07 DIAGNOSIS — K529 Noninfective gastroenteritis and colitis, unspecified: Secondary | ICD-10-CM

## 2023-02-07 MED ORDER — CELECOXIB 200 MG PO CAPS
200.0000 mg | ORAL_CAPSULE | Freq: Two times a day (BID) | ORAL | 1 refills | Status: DC
  Filled 2023-02-07: qty 180, 90d supply, fill #0
  Filled 2023-05-01: qty 180, 90d supply, fill #1

## 2023-02-07 MED ORDER — OMEPRAZOLE 40 MG PO CPDR
40.0000 mg | DELAYED_RELEASE_CAPSULE | Freq: Every evening | ORAL | 1 refills | Status: DC
  Filled 2023-02-07: qty 90, 90d supply, fill #0
  Filled 2023-05-01: qty 90, 90d supply, fill #1

## 2023-02-07 MED ORDER — ESCITALOPRAM OXALATE 20 MG PO TABS
20.0000 mg | ORAL_TABLET | Freq: Every day | ORAL | 1 refills | Status: DC
  Filled 2023-02-07: qty 90, 90d supply, fill #0
  Filled 2023-05-01: qty 90, 90d supply, fill #1

## 2023-02-09 NOTE — Progress Notes (Signed)
Agree with the assessment and plan as outlined by Quentin Mulling, PA-C. ? ?Keron Neenan, DO, FACG ? ?

## 2023-02-10 ENCOUNTER — Encounter: Payer: Self-pay | Admitting: Family Medicine

## 2023-02-10 ENCOUNTER — Ambulatory Visit (INDEPENDENT_AMBULATORY_CARE_PROVIDER_SITE_OTHER): Payer: 59

## 2023-02-10 ENCOUNTER — Other Ambulatory Visit (HOSPITAL_COMMUNITY): Payer: Self-pay

## 2023-02-10 ENCOUNTER — Other Ambulatory Visit: Payer: Self-pay | Admitting: Physician Assistant

## 2023-02-10 ENCOUNTER — Other Ambulatory Visit: Payer: Self-pay

## 2023-02-10 ENCOUNTER — Ambulatory Visit (INDEPENDENT_AMBULATORY_CARE_PROVIDER_SITE_OTHER): Payer: 59 | Admitting: Family Medicine

## 2023-02-10 VITALS — BP 138/80 | HR 69 | Ht 63.5 in | Wt 183.0 lb

## 2023-02-10 DIAGNOSIS — M778 Other enthesopathies, not elsewhere classified: Secondary | ICD-10-CM | POA: Diagnosis not present

## 2023-02-10 DIAGNOSIS — M25511 Pain in right shoulder: Secondary | ICD-10-CM | POA: Diagnosis not present

## 2023-02-10 DIAGNOSIS — M19011 Primary osteoarthritis, right shoulder: Secondary | ICD-10-CM | POA: Diagnosis not present

## 2023-02-10 DIAGNOSIS — M1712 Unilateral primary osteoarthritis, left knee: Secondary | ICD-10-CM

## 2023-02-10 DIAGNOSIS — G8929 Other chronic pain: Secondary | ICD-10-CM

## 2023-02-10 DIAGNOSIS — M25562 Pain in left knee: Secondary | ICD-10-CM | POA: Diagnosis not present

## 2023-02-10 MED ORDER — POLYETHYLENE GLYCOL 3350 17 GM/SCOOP PO POWD
17.0000 g | Freq: Every day | ORAL | 0 refills | Status: DC
  Filled 2023-02-10 – 2023-05-05 (×2): qty 238, 14d supply, fill #0
  Filled 2023-05-20: qty 238, 14d supply, fill #1

## 2023-02-10 MED ORDER — SODIUM HYALURONATE (VISCOSUP) 16.8 MG/2ML IX SOSY
16.8000 mg | PREFILLED_SYRINGE | Freq: Once | INTRA_ARTICULAR | Status: AC
  Administered 2023-02-10: 16.8 mg via INTRA_ARTICULAR

## 2023-02-10 NOTE — Telephone Encounter (Signed)
[place] for LEFT knee OA   Medical Buy and Bill  Primary Insurance: U[employer] Medicare Adv HMO POS Co-pay: n/a Co-insurance: 20% Deductible: $0 of $257 met - must be met for coverage to apply Prior Auth: NOT required  Pharmacy Benefit - [facility]OP  Primary Insurance: [facility] Medicare Adv / [id number] Co-pay: $0 Co-insurance:  Deductible: $45.56 of $590 met Prior Auth: APPROVED PA# [id number] Valid: 02/05/23-01/14/24   Can send to [facility] if using pharmacy benefits   Knee Injection History 11/20/22 - Kenalog - LEFT 02/10/23 - Gelsyn #1 LEFT

## 2023-02-10 NOTE — Patient Instructions (Signed)
Thank you for coming in today.  You received an injection today. Seek immediate medical attention if the joint becomes red, extremely painful, or is oozing fluid.  Please get an Xray today before you leave

## 2023-02-10 NOTE — Progress Notes (Signed)
I, Felicia [name], CMA acting as a scribe for Felicia Graham, MD.  Felicia [name] is a 73 y.o. female who presents to [facility] today for cont'd L knee pain. Pt was last seen by Dr. Denyse [name] on 01/20/23. Last L knee steroid injection; 11/20/22.  Today, pt reports pain and swelling after fall in December. Pain causing night disturbance. Sometimes pain is at medial aspect, sometimes lateral, and other times proximal to patella. Has been sleeping and sitting with pillow between the knees.   Additionally she notes chronic right shoulder pain.  She did have an MRI in April 2022 showing a near complete retracted supraspinatus rotator cuff tear and moderate glenohumeral arthritis.   Dx imaging: 01/20/23 L knee XR 11/20/22 L knee XR   Pertinent review of systems: No fevers or chills  Relevant historical information: Diabetes.  History of endometrial cancer.  History of breast cancer.   Exam:  BP 138/80   Pulse 69   Ht 5' 3.5" (1.613 m)   Wt 183 lb (83 kg)   SpO2 97%   BMI 31.91 kg/m  General: Well Developed, well nourished, and in no acute distress.   MSK: Right shoulder normal-appearing decreased motion pain with abduction.  Left knee: Moderate effusion normal motion.    Lab and Radiology Results:  Felicia [name] presents to clinic today for Gelsyn injection left knee 1/3 Procedure: Real-time Ultrasound Guided Injection of left knee joint superior lateral patellar space Device: Philips Affiniti 50G/GE Logiq Images permanently stored and available for review in PACS Verbal informed consent obtained.  Discussed risks and benefits of procedure. Warned about infection, bleeding, damage to structures among others. Patient expresses understanding and agreement Time-out conducted.   Noted no overlying erythema, induration, or other signs of local infection.   Skin prepped in a sterile fashion.   Local anesthesia: Topical Ethyl chloride.   With sterile technique  and under real time ultrasound guidance: Gelsyn 2 mL injected into knee joint. Fluid seen entering the joint capsule.   Completed without difficulty   Advised to call if fevers/chills, erythema, induration, drainage, or persistent bleeding.   Images permanently stored and available for review in the ultrasound unit.  Impression: Technically successful ultrasound guided injection. Lot number: [id number]  [facility] [address], [address], [place], [place], [address] [email or web address] Felicia [name], Felicia [name] Patient ID: [id number] SOS Date of Birth: April 25, 1950 Gender: Female Procedure Date: April 22, 2020 18:30 Referring Physician: Otho [name] Accession Number: [id number] SOS Procedure Description: MR SHOULDER*R* W/O CM CLINICAL DATA: Diffuse right shoulder pain for 6 months EXAM: MRI OF THE RIGHT SHOULDER WITHOUT CONTRAST TECHNIQUE: Multiplanar, multisequence MR imaging of the shoulder was performed. No intravenous contrast was administered. COMPARISON: X-ray 03/24/2020 FINDINGS: Rotator cuff: Full-thickness, near full width tear of the distal supraspinatus tendon with retraction to the level of the humeral head apex (series 5, images 12-13). Moderate-severe tendinosis of the infraspinatus tendon without discrete tear. Mild subscapularis tendinosis. Intact teres minor. Muscles: Preserved bulk and signal intensity of the rotator cuff musculature without edema, atrophy, or fatty infiltration. Biceps long head: Intra-articular portion of the long head biceps tendon is not well seen and may be related to underlying tendinosis or possible tear. Acromioclavicular Joint: Mild-moderate arthropathy of the AC joint. Trace subacromial-subdeltoid bursal fluid. Glenohumeral Joint: Mild-moderate glenohumeral osteoarthritis with partial thickness chondral loss. No significant joint effusion. Labrum: Grossly  intact.  Bones: No marrow abnormality, fracture or dislocation. Other: None. IMPRESSION: 1. Rotator cuff tendinosis with full-thickness, near full-width tear of the distal supraspinatus tendon with retraction to the level of the humeral head apex. 2. Intra-articular portion of the long head biceps tendon is not well seen and may be related to underlying tendinosis or possible [facility] [address], [address], [place], [place], [address] [email or web address] Felicia [name], Felicia [name] - Report exported on Mon, Feb 10, 2023 14:49:44 -0500 - Page 2 of 2 tear. 3. Mild-moderate glenohumeral and AC joint osteoarthritis. Electronically Signed By: Felicia [name] D.O. On: 04/24/2020 10:23  X-ray images right shoulder obtained today personally and independently interpreted. Mild AC DJD.  High riding humeral head.  Assessment and Plan: 73 y.o. 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  Plan for Gelsyn injection starting today.  Return in 1 week for Gelsyn injection 2/3.  Chronic right shoulder pain due to chronic rotator cuff tear and arthroscopy.  X-ray today shows high riding humeral head indicating chronic rotator cuff tear and some glenohumeral arthritis.  However radiology overread is still pending.  Consider steroid injection and recheck or potentially surgery referral.   PDMP not reviewed this encounter. Orders Placed This Encounter  Procedures   [place] LIMITED JOINT SPACE STRUCTURES LOW LEFT(NO LINKED CHARGES)    Reason for Exam (SYMPTOM  OR DIAGNOSIS REQUIRED):   left knee pain    Preferred imaging location?:   [facility] Shoulder Right    Standing Status:   Future    Number of Occurrences:   1    Expiration Date:   02/10/2024    Reason for Exam (SYMPTOM  OR DIAGNOSIS REQUIRED):   right shoulder pain    Preferred imaging location?:   [facility]   Meds ordered this encounter  Medications   sodium hyaluronate (viscosup) (GELSYN-3) intra-articular injection  16.8 mg     Discussed warning signs or symptoms. Please see discharge instructions. Patient expresses understanding.   The above documentation has been reviewed and is accurate and complete Felicia [name], M.D.

## 2023-02-11 ENCOUNTER — Encounter: Payer: Self-pay | Admitting: Oncology

## 2023-02-11 ENCOUNTER — Other Ambulatory Visit: Payer: Self-pay

## 2023-02-11 NOTE — Telephone Encounter (Signed)
Patient has started [place] series.

## 2023-02-12 ENCOUNTER — Other Ambulatory Visit (HOSPITAL_COMMUNITY): Payer: Self-pay

## 2023-02-12 ENCOUNTER — Other Ambulatory Visit: Payer: Self-pay | Admitting: Physician Assistant

## 2023-02-12 DIAGNOSIS — F32A Depression, unspecified: Secondary | ICD-10-CM

## 2023-02-12 MED ORDER — BUPROPION HCL ER (XL) 150 MG PO TB24
150.0000 mg | ORAL_TABLET | Freq: Every morning | ORAL | 1 refills | Status: DC
  Filled 2023-02-14: qty 30, 30d supply, fill #0
  Filled 2023-03-15: qty 30, 30d supply, fill #1

## 2023-02-13 ENCOUNTER — Other Ambulatory Visit (HOSPITAL_COMMUNITY): Payer: Self-pay

## 2023-02-13 ENCOUNTER — Encounter (HOSPITAL_COMMUNITY): Payer: Self-pay

## 2023-02-13 LAB — FECAL FAT, QUALITATIVE
Fat Qual Neutral, Stl: NORMAL
Fat Qual Total, Stl: NORMAL

## 2023-02-13 LAB — PANCREATIC ELASTASE, FECAL: Pancreatic Elastase-1, Stool: >800 ug/g (ref >200)

## 2023-02-14 ENCOUNTER — Other Ambulatory Visit (HOSPITAL_COMMUNITY): Payer: Self-pay

## 2023-02-14 ENCOUNTER — Other Ambulatory Visit: Payer: Self-pay

## 2023-02-17 ENCOUNTER — Ambulatory Visit (INDEPENDENT_AMBULATORY_CARE_PROVIDER_SITE_OTHER): Payer: 59 | Admitting: Family Medicine

## 2023-02-17 ENCOUNTER — Encounter: Payer: Self-pay | Admitting: Family Medicine

## 2023-02-17 ENCOUNTER — Other Ambulatory Visit: Payer: Self-pay

## 2023-02-17 VITALS — BP 120/78 | HR 62 | Ht 63.5 in

## 2023-02-17 DIAGNOSIS — M25562 Pain in left knee: Secondary | ICD-10-CM | POA: Diagnosis not present

## 2023-02-17 DIAGNOSIS — M1712 Unilateral primary osteoarthritis, left knee: Secondary | ICD-10-CM | POA: Diagnosis not present

## 2023-02-17 DIAGNOSIS — G8929 Other chronic pain: Secondary | ICD-10-CM | POA: Diagnosis not present

## 2023-02-17 DIAGNOSIS — M25511 Pain in right shoulder: Secondary | ICD-10-CM

## 2023-02-17 MED ORDER — SODIUM HYALURONATE (VISCOSUP) 16.8 MG/2ML IX SOSY
16.8000 mg | PREFILLED_SYRINGE | Freq: Once | INTRA_ARTICULAR | Status: AC
  Administered 2023-02-17: 16.8 mg via INTRA_ARTICULAR

## 2023-02-17 NOTE — Patient Instructions (Signed)
Thank you for coming in today.  You received an injection today. Seek immediate medical attention if the joint becomes red, extremely painful, or is oozing fluid.   See you back in 1 week for injection #3

## 2023-02-17 NOTE — Progress Notes (Signed)
I, Stevenson Clinch, CMA acting as a scribe for Clementeen Graham, MD.  Felicia [name] is a 73 y.o. female who presents to [facility] today for [employer] #2, L knee.   Pt also c/o shoulder pain, chronic. Pt c/o HA related to shoulder/neck pain. Mechanical sx present.    Pertinent review of systems: No fevers or chills  Relevant historical information: Diabetes controlled   Exam:  BP 120/78   Pulse 62   Ht 5' 3.5" (1.613 m)   SpO2 96%   BMI 31.91 kg/m  General: Well Developed, well nourished, and in no acute distress.   MSK: Left knee mild effusion normal motion with crepitation.  Right shoulder normal-appearing decreased range of motion.    Lab and Radiology Results  Felicia [name] presents to clinic today for Gelsyn injection left knee 2/3 Procedure: Real-time Ultrasound Guided Injection of left knee joint superior lateral patellar space Device: Philips Affiniti 50G/GE Logiq Images permanently stored and available for review in PACS Verbal informed consent obtained.  Discussed risks and benefits of procedure. Warned about infection, bleeding, damage to structures among others. Patient expresses understanding and agreement Time-out conducted.   Noted no overlying erythema, induration, or other signs of local infection.   Skin prepped in a sterile fashion.   Local anesthesia: Topical Ethyl chloride.   With sterile technique and under real time ultrasound guidance: Gelsyn 2 mL injected into knee joint. Fluid seen entering the joint capsule.   Completed without difficulty   Advised to call if fevers/chills, erythema, induration, drainage, or persistent bleeding.   Images permanently stored and available for review in the ultrasound unit.  Impression: Technically successful ultrasound guided injection. Lot number: [id number]  Procedure: Real-time Ultrasound Guided Injection of right shoulder glenohumeral joint posterior approach Device: Philips Affiniti  50G/GE Logiq Images permanently stored and available for review in PACS Verbal informed consent obtained.  Discussed risks and benefits of procedure. Warned about infection, bleeding, hyperglycemia damage to structures among others. Patient expresses understanding and agreement Time-out conducted.   Noted no overlying erythema, induration, or other signs of local infection.   Skin prepped in a sterile fashion.   Local anesthesia: Topical Ethyl chloride.   With sterile technique and under real time ultrasound guidance: 40 mg of Kenalog and 2 mL of Marcaine injected into glenohumeral joint. Fluid seen entering the joint capsule.   Completed without difficulty   Pain immediately resolved suggesting accurate placement of the medication.   Advised to call if fevers/chills, erythema, induration, drainage, or persistent bleeding.   Images permanently stored and available for review in the ultrasound unit.  Impression: Technically successful ultrasound guided injection.         Assessment and Plan: 73 y.o. 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  Plan for gel injection 2/3 left knee today.  Return in 1 week for gel injection 3/3 of the left knee.  Chronic right shoulder pain due to rotator cuff arthropathy.  Plan for glenohumeral injection today and check back as needed for this. We talked about reverse total shoulder replacement and options going forward in the future if this injection does not work.  PDMP not reviewed this encounter. Orders Placed This Encounter  Procedures   [place] LIMITED JOINT SPACE STRUCTURES LOW LEFT(NO LINKED CHARGES)    Reason for Exam (SYMPTOM  OR DIAGNOSIS REQUIRED):   left knee pain    Preferred imaging location?:   [facility] ordered this  encounter  Medications   sodium hyaluronate (viscosup) (GELSYN-3) intra-articular injection 16.8 mg     Discussed warning signs or symptoms. Please see discharge instructions. Patient  expresses understanding.   The above documentation has been reviewed and is accurate and complete Clementeen Graham, M.D.

## 2023-02-17 NOTE — Progress Notes (Signed)
Right shoulder x-ray shows some arthritis changes.

## 2023-02-21 ENCOUNTER — Other Ambulatory Visit: Payer: Self-pay | Admitting: Pulmonary Disease

## 2023-02-21 ENCOUNTER — Encounter: Payer: Self-pay | Admitting: Oncology

## 2023-02-21 ENCOUNTER — Other Ambulatory Visit (HOSPITAL_COMMUNITY): Payer: Self-pay

## 2023-02-21 ENCOUNTER — Other Ambulatory Visit: Payer: Self-pay

## 2023-02-21 MED ORDER — LORATADINE 10 MG PO TABS
10.0000 mg | ORAL_TABLET | Freq: Every evening | ORAL | 3 refills | Status: DC
  Filled 2023-02-21: qty 90, 90d supply, fill #0
  Filled 2023-05-15 – 2023-05-20 (×2): qty 90, 90d supply, fill #1
  Filled 2023-08-19: qty 90, 90d supply, fill #2
  Filled 2023-11-22: qty 90, 90d supply, fill #3

## 2023-02-24 ENCOUNTER — Other Ambulatory Visit: Payer: Self-pay

## 2023-02-24 ENCOUNTER — Ambulatory Visit (INDEPENDENT_AMBULATORY_CARE_PROVIDER_SITE_OTHER): Payer: 59 | Admitting: Family Medicine

## 2023-02-24 DIAGNOSIS — M1712 Unilateral primary osteoarthritis, left knee: Secondary | ICD-10-CM

## 2023-02-24 DIAGNOSIS — M25562 Pain in left knee: Secondary | ICD-10-CM

## 2023-02-24 DIAGNOSIS — G8929 Other chronic pain: Secondary | ICD-10-CM | POA: Diagnosis not present

## 2023-02-24 MED ORDER — SODIUM HYALURONATE (VISCOSUP) 16.8 MG/2ML IX SOSY
16.8000 mg | PREFILLED_SYRINGE | Freq: Once | INTRA_ARTICULAR | Status: AC
  Administered 2023-02-24: 16.8 mg via INTRA_ARTICULAR

## 2023-02-24 NOTE — Patient Instructions (Addendum)
 Thank you for coming in today.   You received an injection today. Seek immediate medical attention if the joint becomes red, extremely painful, or is oozing fluid.   Next step is not better is an MRI. Let me know.

## 2023-02-24 NOTE — Progress Notes (Signed)
 Felicia [name] presents to clinic today for Gelsyn injection left knee 3/3 Procedure: Real-time Ultrasound Guided Injection of left knee joint superior lateral patellar space Device: Philips Affiniti 50G/GE Logiq Images permanently stored and available for review in PACS Verbal informed consent obtained.  Discussed risks and benefits of procedure. Warned about infection, bleeding, damage to structures among others. Patient expresses understanding and agreement Time-out conducted.   Noted no overlying erythema, induration, or other signs of local infection.   Skin prepped in a sterile fashion.   Local anesthesia: Topical Ethyl chloride.   With sterile technique and under real time ultrasound guidance: Gelsyn 2 mL injected into knee joint. Fluid seen entering the joint capsule.   Completed without difficulty   Advised to call if fevers/chills, erythema, induration, drainage, or persistent bleeding.   Images permanently stored and available for review in the ultrasound unit.  Impression: Technically successful ultrasound guided injection. Lot number: [id number] Patient does not feel like she is feeling much better.  Next step if this third injection does not work would be MRI knee.

## 2023-02-26 ENCOUNTER — Other Ambulatory Visit (HOSPITAL_COMMUNITY): Payer: Self-pay

## 2023-03-04 ENCOUNTER — Ambulatory Visit: Payer: Self-pay

## 2023-03-04 NOTE — Patient Outreach (Signed)
  Care Coordination   Follow Up Visit Note   03/04/2023 Name: Felicia [name] MRN: [id number] DOB: 1950-07-27  Felicia [name] is a 73 y.o. year old female who sees Allwardt, Crist Infante, PA-C for primary care. I spoke with  Felicia [name] by phone today.  What matters to the patients health and wellness today?  Maintaining health    Goals Addressed             This Visit's Progress    Diabetes Management       Patient Goals/Self Care Activities: -Patient/Caregiver will self-administer medications as prescribed as evidenced by self-report/primary caregiver report  -Patient/Caregiver will attend all scheduled provider appointments as evidenced by clinician review of documented attendance to scheduled appointments and patient/caregiver report -Patient/Caregiver will call provider office for new concerns or questions as evidenced by review of documented incoming telephone call notes and patient report  -check blood sugar at prescribed times -check blood sugar if I feel it is too high or too low -record values and write them down take them to all doctor visits    Patient reports  doing okay.  Left knee bothering her from fall.  3rd gel shot to left knee has been good.  Blood sugars are good right now in the 130's and less.  Reiterated continued diabetes management.  Patient on meals on wheels now. No concerns.          SDOH assessments and interventions completed:  Yes  SDOH Interventions Today    Flowsheet Row Most Recent Value  SDOH Interventions   Food Insecurity Interventions Intervention Not Indicated  Housing Interventions Intervention Not Indicated  Transportation Interventions Intervention Not Indicated, SCAT ([employer]ized Community [employer]), Payor Benefit  Utilities Interventions Intervention Not Indicated        Care Coordination Interventions:  Yes, provided   Follow up plan: Follow up call scheduled for March    Encounter Outcome:  Patient Visit Completed    Bary Leriche RN, MSN [facility], [facility] RN Care Manager Direct Dial: [phone]  Fax: [phone] Website: [name]

## 2023-03-04 NOTE — Patient Instructions (Signed)
Visit Information  Thank you for taking time to visit with me today. Please don't hesitate to contact me if I can be of assistance to you.   Following are the goals we discussed today:   Goals Addressed             This Visit's Progress    Diabetes Management       Patient Goals/Self Care Activities: -Patient/Caregiver will self-administer medications as prescribed as evidenced by self-report/primary caregiver report  -Patient/Caregiver will attend all scheduled provider appointments as evidenced by clinician review of documented attendance to scheduled appointments and patient/caregiver report -Patient/Caregiver will call provider office for new concerns or questions as evidenced by review of documented incoming telephone call notes and patient report  -check blood sugar at prescribed times -check blood sugar if I feel it is too high or too low -record values and write them down take them to all doctor visits    Patient reports  doing okay.  Left knee bothering her from fall.  3rd gel shot to left knee has been good.  Blood sugars are good right now in the 130's and less.  Reiterated continued diabetes management.  Patient on meals on wheels now. No concerns.          Our next appointment is by telephone on 04/01/23 at 1030 am  Please call the care guide team at [phone] if you need to cancel or reschedule your appointment.   If you are experiencing a Mental Health or Behavioral Health Crisis or need someone to talk to, please call the Suicide and Crisis Lifeline: 988   Patient verbalizes understanding of instructions and care plan provided today and agrees to view in MyChart. Active MyChart status and patient understanding of how to access instructions and care plan via MyChart confirmed with patient.     The patient has been provided with contact information for the care management team and has been advised to call with any health related questions or concerns.   Bary Leriche RN, MSN [facility], [facility] RN Care Manager Direct Dial: [phone]  Fax: [phone] Website: [name]

## 2023-03-07 NOTE — Telephone Encounter (Signed)
Gelsyn injection schedule, LEFT knee #1 02/10/23 #2 02/17/23 #3 02/24/23  Can consider repeat injections on or after 08/25/23

## 2023-03-17 ENCOUNTER — Other Ambulatory Visit: Payer: Self-pay

## 2023-03-18 ENCOUNTER — Other Ambulatory Visit: Payer: Self-pay | Admitting: Neurology

## 2023-03-18 ENCOUNTER — Encounter: Payer: Self-pay | Admitting: Physician Assistant

## 2023-03-18 ENCOUNTER — Other Ambulatory Visit: Payer: Self-pay

## 2023-03-18 ENCOUNTER — Ambulatory Visit (INDEPENDENT_AMBULATORY_CARE_PROVIDER_SITE_OTHER): Payer: 59 | Admitting: Physician Assistant

## 2023-03-18 ENCOUNTER — Other Ambulatory Visit (HOSPITAL_COMMUNITY): Payer: Self-pay

## 2023-03-18 ENCOUNTER — Other Ambulatory Visit: Payer: Self-pay | Admitting: Physician Assistant

## 2023-03-18 VITALS — BP 132/66 | HR 80 | Temp 97.6°F | Ht 63.5 in | Wt 184.6 lb

## 2023-03-18 DIAGNOSIS — R197 Diarrhea, unspecified: Secondary | ICD-10-CM | POA: Diagnosis not present

## 2023-03-18 DIAGNOSIS — R0982 Postnasal drip: Secondary | ICD-10-CM

## 2023-03-18 DIAGNOSIS — E038 Other specified hypothyroidism: Secondary | ICD-10-CM

## 2023-03-18 DIAGNOSIS — E119 Type 2 diabetes mellitus without complications: Secondary | ICD-10-CM | POA: Diagnosis not present

## 2023-03-18 DIAGNOSIS — H02403 Unspecified ptosis of bilateral eyelids: Secondary | ICD-10-CM

## 2023-03-18 DIAGNOSIS — R070 Pain in throat: Secondary | ICD-10-CM | POA: Diagnosis not present

## 2023-03-18 DIAGNOSIS — R490 Dysphonia: Secondary | ICD-10-CM

## 2023-03-18 DIAGNOSIS — H02206 Unspecified lagophthalmos left eye, unspecified eyelid: Secondary | ICD-10-CM

## 2023-03-18 LAB — POCT GLYCOSYLATED HEMOGLOBIN (HGB A1C): Hemoglobin A1C: 9.2 % — AB (ref 4.0–5.6)

## 2023-03-18 MED ORDER — AZELASTINE HCL 0.1 % NA SOLN
2.0000 | Freq: Two times a day (BID) | NASAL | 11 refills | Status: AC
  Filled 2023-03-18: qty 30, 50d supply, fill #0
  Filled 2023-05-01: qty 30, 50d supply, fill #1
  Filled 2023-06-20: qty 30, 50d supply, fill #2
  Filled 2023-08-09: qty 30, 50d supply, fill #3
  Filled 2023-09-23: qty 30, 50d supply, fill #4
  Filled 2023-11-12: qty 30, 50d supply, fill #5
  Filled 2024-01-01: qty 30, 50d supply, fill #6
  Filled 2024-02-13 – 2024-02-19 (×2): qty 30, 50d supply, fill #7

## 2023-03-18 MED ORDER — ROPINIROLE HCL 0.5 MG PO TABS
0.5000 mg | ORAL_TABLET | Freq: Every day | ORAL | 1 refills | Status: DC
  Filled 2023-03-18: qty 90, 90d supply, fill #0

## 2023-03-18 MED ORDER — LEVOTHYROXINE SODIUM 125 MCG PO TABS
125.0000 ug | ORAL_TABLET | Freq: Every day | ORAL | 2 refills | Status: DC
Start: 2023-03-18 — End: 2023-06-09
  Filled 2023-03-18: qty 30, 30d supply, fill #0
  Filled 2023-04-17: qty 30, 30d supply, fill #1
  Filled 2023-05-12: qty 30, 30d supply, fill #2

## 2023-03-18 NOTE — Progress Notes (Unsigned)
 Patient ID: Felicia [name], female    DOB: 08-24-1950, 73 y.o.   MRN: [id number]   Assessment & Plan:  Type 2 diabetes mellitus without complication, without long-term current use of insulin (HCC) -     POCT glycosylated hemoglobin (Hb A1C) -     Microalbumin / creatinine urine ratio  Eyelid closing impairment, left -     Ambulatory referral to Ophthalmology  Droopy eyelid, bilateral -     Ambulatory referral to Ophthalmology  Other specified hypothyroidism -     TSH -     T4, free -     T3, free -     US THYROID; Future  Hoarseness -     TSH -     T4, free -     T3, free -     US THYROID; Future  Throat pain -     TSH -     T4, free -     T3, free -     US THYROID; Future  Postnasal drip -     Azelastine HCl; Place 2 sprays into both nostrils 2 (two) times daily. Use in each nostril as directed  Dispense: 30 mL; Refill: 11  Diarrhea, unspecified type   Assessment and Plan    Hoarseness and Throat Pain Worsening hoarseness and focal throat pain on the left side around the thyroid, raising concern for potential cancer recurrence. Further investigation needed to rule out thyroid nodules or enlargement. - Recheck TSH, T3, T4 levels. - Schedule ultrasound of the thyroid. - Consider MRI of the neck.  Postnasal Drip Significant postnasal drip contributing to hoarseness and sore throat. Astelin nasal spray discussed as a targeted treatment. - Start Astelin nasal spray.  Involuntary Eyelid Closure and Droopy Eyelids Involuntary closure of the left eyelid and droopy eyelids suggest a structural issue. Referral to an oculoplastic surgeon for further evaluation. - Refer to [facility] in [facility] for evaluation of eyelid issues.  Trigeminal Neuralgia Symptoms suggestive of trigeminal neuralgia, including facial pain and sensory discrepancies. Multidisciplinary approach needed. - Discuss trigeminal neuralgia with neurologist.  Uncontrolled Diabetes  Mellitus Diabetes is uncontrolled with an A1c of 9.2%. Currently on metformin and insulin, but experiencing diarrhea potentially related to metformin. Plans to adjust diabetes management. - Stop metformin. - Follow up in a couple of weeks to discuss diabetes management.           Return in about 2 weeks (around 04/01/2023) for recheck/follow-up (virtual ok) - Diabetes Management only .    Subjective:    Chief Complaint  Patient presents with   Medical Management of Chronic Issues    Pt in office for 3 mon f/u for Diabetes; A!C check and urine; pt c/o allergies; post nasal drip; losing voice/hoarseness; deep sore throat; pain in throat and mouth has returned as well;    HPI Patient is in today for   Past Medical History:  Diagnosis Date   Adenomatous colon polyp 2015   Anemia    Anxiety    Breast cancer (HCC) 09/2019   left breast IMC   CAD (coronary artery disease)    a. 3/2007s/p DES to the LAD [facility]); b. 01/2017 MV: EF 80%, small, mild apical ant defect w/o ischemia (felt to be breast atten). Low risk.   Depression    DM (diabetes mellitus) (HCC)    Emphysema lung (HCC)    Essential hypertension    GERD (gastroesophageal reflux disease)    Headache  secondary to neck surgery per patient   High triglycerides    HTN (hypertension)    Hyperlipidemia    Hypothyroid    OA (osteoarthritis)    right knee,hands   Overflow incontinence    PONV (postoperative nausea and vomiting)    s/p gallbladder    RLS (restless legs syndrome)    Urinary urgency    Uterine cancer (HCC) dx'd 2014    Past Surgical History:  Procedure Laterality Date   BREAST LUMPECTOMY WITH RADIOACTIVE SEED AND SENTINEL LYMPH NODE BIOPSY Left 10/07/2019   Procedure: LEFT BREAST LUMPECTOMY WITH RADIOACTIVE SEED AND SENTINEL LYMPH NODE BIOPSY;  Surgeon: Emelia Loron, MD;  Location: [facility];  Service: General;  Laterality: Left;   CHOLECYSTECTOMY     CORONARY STENT  PLACEMENT     CORONARY/GRAFT ACUTE MI REVASCULARIZATION     FINGER ARTHRODESIS Right 06/11/2019   Procedure: ARTHRODESIS INDEX FINGER DISTAL PHALANGEAL JOINT;  Surgeon: Betha Loa, MD;  Location: [facility];  Service: Orthopedics;  Laterality: Right;   FOOT SURGERY Left    LEFT HEART CATH AND CORONARY ANGIOGRAPHY N/A 02/16/2018   Procedure: LEFT HEART CATH AND CORONARY ANGIOGRAPHY;  Surgeon: Runell Gess, MD;  Location: [facility];  Service: Cardiovascular;  Laterality: N/A;   LEFT HEART CATH AND CORONARY ANGIOGRAPHY N/A 05/01/2020   Procedure: LEFT HEART CATH AND CORONARY ANGIOGRAPHY;  Surgeon: Runell Gess, MD;  Location: [facility];  Service: Cardiovascular;  Laterality: N/A;   PORTACATH PLACEMENT Right 11/23/2019   Procedure: INSERTION PORT-A-CATH WITH ULTRASOUND GUIDANCE;  Surgeon: Emelia Loron, MD;  Location: [facility];  Service: General;  Laterality: Right;   REPLACEMENT TOTAL KNEE Right    SPINE SURGERY     TOTAL ABDOMINAL HYSTERECTOMY     FOR UTERUS CANCER   TOTAL HIP ARTHROPLASTY Left 2015    Family History  Problem Relation Age of Onset   AAA (abdominal aortic aneurysm) Mother    Thyroid disease Mother    Colon cancer Mother    Diabetes Father    Alzheimer's disease Father    Arthritis Sister    Heart disease Sister    Throat cancer Sister    Kidney failure Sister    Diabetes Sister    Diabetes Brother    Diabetes Brother    Breast cancer Cousin     Social History   Tobacco Use   Smoking status: Never   Smokeless tobacco: Never  Vaping Use   Vaping status: Never Used  Substance Use Topics   Alcohol use: Yes    Comment: OCCASIONALLY   Drug use: No     Allergies  Allergen Reactions   Chloraprep One Step [Chlorhexidine Gluconate] Rash   Estrogens Other (See Comments)    PATIENT HAS A HISTORY OF CANCER AND HAS BEEN TOLD TO NEVER TAKE ANYTHING CONTAINING ESTROGEN, AS IT MIGHT CAUSE A RECURRENCE    Shellfish-Derived Products Nausea And Vomiting   Shrimp [Shellfish Allergy] Nausea And Vomiting    Review of Systems NEGATIVE UNLESS OTHERWISE INDICATED IN HPI      Objective:     BP 132/66 (BP Location: Left Arm, Patient Position: Sitting, Cuff Size: Normal)   Pulse 80   Temp 97.6 F (36.4 C) (Temporal)   Ht 5' 3.5" (1.613 m)   Wt 184 lb 9.6 oz (83.7 kg)   SpO2 99%   BMI 32.19 kg/m   Wt Readings from Last 3 Encounters:  03/18/23 184 lb  9.6 oz (83.7 kg)  02/10/23 183 lb (83 kg)  02/03/23 183 lb (83 kg)    BP Readings from Last 3 Encounters:  03/18/23 132/66  02/17/23 120/78  02/10/23 138/80     Physical Exam Vitals and nursing note reviewed.  Constitutional:      Appearance: Normal appearance. She is normal weight. She is not toxic-appearing.  HENT:     Head: Normocephalic and atraumatic.     Right Ear: External ear normal.     Left Ear: External ear normal.  Eyes:     Extraocular Movements: Extraocular movements intact.     Conjunctiva/sclera: Conjunctivae normal.     Pupils: Pupils are equal, round, and reactive to light.  Neck:   Cardiovascular:     Rate and Rhythm: Normal rate and regular rhythm.     Pulses: Normal pulses.     Heart sounds: Normal heart sounds.  Pulmonary:     Effort: Pulmonary effort is normal.     Breath sounds: Normal breath sounds.  Musculoskeletal:        General: Normal range of motion.     Cervical back: Normal range of motion and neck supple.  Skin:    General: Skin is warm and dry.  Neurological:     General: No focal deficit present.     Mental Status: She is alert and oriented to person, place, and time.  Psychiatric:        Mood and Affect: Mood normal.        Behavior: Behavior normal.           Time Spent: 51 minutes of total time was spent on the date of the encounter performing the following actions: chart review prior to seeing the patient, obtaining history, performing a medically necessary exam,  counseling on the treatment plan, placing orders, and documenting in our EHR.       Doy Taaffe M Rook Maue, PA-C

## 2023-03-18 NOTE — Patient Instructions (Signed)
 Thyroid labs today  Schedule for thyroid Ultrasound  Microalbumin urine test today  Referral to [facility] for eyelid concerns  Astelin nasal spray to help with sinus concerns  STOP Metformin at this time, see if this helps diarrhea; recheck in 2 weeks - diabetes management discussion only at that visit

## 2023-03-19 LAB — MICROALBUMIN / CREATININE URINE RATIO
Creatinine,U: 34 mg/dL
Microalb Creat Ratio: 20.6 mg/g (ref 0.0–30.0)
Microalb, Ur: <0.7 mg/dL (ref 0.0–1.9)

## 2023-03-19 LAB — T3, FREE: T3, Free: 2.6 pg/mL (ref 2.3–4.2)

## 2023-03-19 LAB — TSH: TSH: 3.19 u[IU]/mL (ref 0.35–5.50)

## 2023-03-19 LAB — T4, FREE: Free T4: 1 ng/dL (ref 0.60–1.60)

## 2023-03-20 ENCOUNTER — Other Ambulatory Visit (HOSPITAL_COMMUNITY): Payer: Self-pay

## 2023-03-20 ENCOUNTER — Other Ambulatory Visit: Payer: Self-pay

## 2023-03-20 MED ORDER — GABAPENTIN 600 MG PO TABS
600.0000 mg | ORAL_TABLET | Freq: Three times a day (TID) | ORAL | 0 refills | Status: DC
  Filled 2023-03-20: qty 90, 30d supply, fill #0

## 2023-03-21 NOTE — Progress Notes (Deleted)
 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  If no improvement in 4 weeks, I would have her take tizanidine 2mg  three times daily. Follow up in 6 months        Subjective:  Felicia [name] is a 73 year old female with CAD, HTN, HLD, DM II, hypothyroidism, osteoarthritis, depression, anxiety and history of breast cancer and uterine cancer who follows up for headache.  UPDATE: Increased gabapentin. ***   Current NSAIDS/analgesics:  ASA 81mg  daily, celecoxib, tramadol Current triptans:  none Current ergotamine:  none Current anti-emetic:  Compazine Current muscle relaxants:  tizanidine at bedtime PRN Current Antihypertensive medications:  none Current Antidepressant medications:  escitalopram 20mg  Current Anticonvulsant medications:  gabapentin 600mg  three times daily Current anti-CGRP:  none Current Antihistamines/Decongestants:  Claritin Other therapy:  none Other medications:  ropinirole 0.5mg  QHS     Caffeine:  no coffee.  Diet cola.  Decaf tea   HISTORY: She has had occasional sharp right sided paroxysmal headaches since childhood.  In 2017, she started developing occipital headaches with neck pain, left arm pain and balance problems.  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  Continues to have chronic neck pain and headache.  Neck pain radiates down into both shoulders.  Headache is often diffuse and throbbing but still has occasional sharp right sided headache.  Also may have left eye pain that radiates to top of head.  Sometimes phonophobia but usually not.  No nausea, vomiting, photophobia or visual disturbance.  Always with a persistent dull headache but has exacerbations everyday lasting a few  hours.  Takes Tylenol or another "headache medication" daily.  Cannot get injections due to hardware in the cervical spine.  She also has left sided jaw pain with prior diagnosis of TMJ dysfunction but CT sinuses from August 2024 did not reveal TMJ arthropathy.     MRI of brain with and without contrast on 02/14/2021 personally reviewed showed mild chronic small vessel ischemic changes within the cerebral white matter but overall unremarkable.     Past NSAIDS/analgesics:  tramadol Past abortive triptans:  none Past abortive ergotamine:  none Past muscle relaxants:  tizanidine Past anti-emetic:  Zofran 8mg  Past antihypertensive medications:  none Past antidepressant medications:  amitriptyline (helpful) Past anticonvulsant medications:  none Past anti-CGRP:  none Past vitamins/Herbal/Supplements:  none Past antihistamines/decongestants:  none Other past therapies:  none    Family history of headache:  unknown.      PAST MEDICAL HISTORY: Past Medical History:  Diagnosis Date   Adenomatous colon polyp 2015   Anemia    Anxiety    Breast cancer (HCC) 09/2019   left breast IMC   CAD (coronary artery disease)    a. 3/2007s/p DES to the LAD [facility]); b. 01/2017 MV: EF 80%, small, mild apical ant defect w/o ischemia (felt to be breast atten). Low risk.   Depression    DM (diabetes mellitus) (HCC)    Emphysema lung (HCC)    Essential hypertension    GERD (gastroesophageal reflux disease)    Headache    secondary to neck surgery per patient   High triglycerides    HTN (hypertension)    Hyperlipidemia    Hypothyroid    OA (osteoarthritis)    right knee,hands  Overflow incontinence    PONV (postoperative nausea and vomiting)    s/p gallbladder    RLS (restless legs syndrome)    Urinary urgency    Uterine cancer (HCC) dx'd 2014    MEDICATIONS: Current Outpatient Medications on File Prior to Visit  Medication Sig Dispense Refill   ACCU-CHEK GUIDE test strip USE TO check  blood glucose UP TO four times daily AS DIRECTED 100 strip 0   Accu-Chek Softclix Lancets lancets USE TO check blood glucose UP TO four times daily AS DIRECTED 100 each 0   albuterol (VENTOLIN HFA) 108 (90 Base) MCG/ACT inhaler Inhale 2 puffs into the lungs every 6 (six) hours as needed for wheezing or shortness of breath. 6.7 g 2   anastrozole (ARIMIDEX) 1 MG tablet Take 1 tablet (1 mg total) by mouth daily. 90 tablet 4   Ascorbic Acid (VITAMIN C PO) Take 1 tablet by mouth daily.     aspirin EC (ASPIRIN 81) 81 MG tablet Take 1 tablet (81 mg total) by mouth in the morning. 90 tablet 99   azelastine (ASTELIN) 0.1 % nasal spray Place 2 sprays into both nostrils 2 (two) times daily. Use in each nostril as directed 30 mL 11   bisoprolol (ZEBETA) 5 MG tablet Take 1 tablet (5 mg total) by mouth at bedtime. 90 tablet 3   blood glucose meter kit and supplies Dispense based on patient and insurance preference. Use up to four times daily as directed. (FOR ICD-10 E10.9, E11.9). 1 each 0   buPROPion (WELLBUTRIN XL) 150 MG 24 hr tablet Take 1 tablet (150 mg total) by mouth every morning for depression. 30 tablet 1   celecoxib (CELEBREX) 200 MG capsule Take 1 capsule (200 mg total) by mouth 2 (two) times daily. 180 capsule 1   CINNAMON PO Take 1 capsule by mouth daily with supper.     diclofenac sodium (VOLTAREN) 1 % GEL Apply 2-4 g topically 4 (four) times daily as needed (as directed for pain).     empagliflozin (JARDIANCE) 25 MG TABS tablet Take 1 tablet (25 mg total) by mouth daily. 90 tablet 1   escitalopram (LEXAPRO) 20 MG tablet Take 1 tablet (20 mg total) by mouth daily. 90 tablet 1   fenofibrate (TRICOR) 145 MG tablet Take 1 tablet (145 mg total) by mouth at bedtime. 90 tablet 3   Ferrous Sulfate (IRON PO) Take 1 tablet by mouth daily. alternates days:1 tablet one day and 2 tablets the next day     gabapentin (NEURONTIN) 600 MG tablet Take 1 tablet (600 mg total) by mouth 3 (three) times daily. 90 tablet  0   glipiZIDE (GLUCOTROL XL) 10 MG 24 hr tablet Take 2 tablets (20 mg total) by mouth every morning. 90 tablet 1   glucose blood test strip Use to check blood sugar up to four times daily as directed 100 each 3   HYDROcodone-acetaminophen (NORCO/VICODIN) 5-325 MG tablet Take 1 tablet by mouth every 6 (six) hours as needed. 15 tablet 0   Incontinence Supply Disposable (DEPEND ADJUSTABLE UNDERWEAR) MISC 1 Application by Does not apply route daily. 16 each 11   insulin glargine (LANTUS SOLOSTAR) 100 UNIT/ML Solostar Pen Start with injecting 4 units under the skin in the evenings. Increase by 2 units every 2 days until monring fasting glucose is to goal (140). Max units 60 per day. 15 mL 11   Insulin Pen Needle (PEN NEEDLES) 32G X 4 MM MISC USE TO INJECT INSULIN 100 each 3  Lancets MISC Use to check blood sugar up to four times daily as directed 100 each 3   levothyroxine (SYNTHROID) 125 MCG tablet Take 1 tablet (125 mcg total) by mouth daily before breakfast. 30 tablet 2   loratadine (ALLERGY RELIEF) 10 MG tablet Take 1 tablet (10 mg total) by mouth every evening. 90 tablet 3   metroNIDAZOLE (METROGEL) 1 % gel Apply 1 application topically as needed (as directed to affected area). 45 g 2   Multiple Vitamins-Calcium (ONE-A-DAY WOMENS PO) Take 1 tablet by mouth daily.     nitroGLYCERIN (NITROSTAT) 0.4 MG SL tablet Dissolve 1 tablet under the tongue every 5 minutes as needed for chest pain. Max of 3 doses, then 911. 75 tablet 2   omeprazole (PRILOSEC) 40 MG capsule Take 1 capsule (40 mg total) by mouth every evening. 90 capsule 1   omeprazole (PRILOSEC) 40 MG capsule Take 1 capsule (40 mg total) by mouth every evening. 90 capsule 1   polyethylene glycol powder (MIRALAX) 17 GM/SCOOP powder Take 17 g by mouth daily. 255 g 0   rOPINIRole (REQUIP) 0.5 MG tablet Take 1 tablet (0.5 mg total) by mouth at bedtime. 90 tablet 1   Tiotropium Bromide-Olodaterol (STIOLTO RESPIMAT) 2.5-2.5 MCG/ACT AERS Inhale 2 puffs  into the lungs daily. 4 g 2   tiZANidine (ZANAFLEX) 2 MG tablet Take 1-2 tablets (2-4 mg total) by mouth at bedtime as needed. 60 tablet 12   traMADol (ULTRAM) 50 MG tablet Take 1 tablet (50 mg total) by mouth every 6 (six) hours as needed for moderate pain (pain score 4-6) or severe pain (pain score 7-10). 30 tablet 0   triamcinolone (KENALOG) 0.1 % paste Apply a small amount to affected area twice daily for 2 - 3 weeks. 5 g 1   TURMERIC PO Take 1 capsule by mouth daily with supper.     Wheat Dextrin (BENEFIBER) POWD 1-2 TBSP work up to 3 x a day, mix in water or liquid 730 g 0   No current facility-administered medications on file prior to visit.    ALLERGIES: Allergies  Allergen Reactions   Chloraprep One Step [Chlorhexidine Gluconate] Rash   Estrogens Other (See Comments)    PATIENT HAS A HISTORY OF CANCER AND HAS BEEN TOLD TO NEVER TAKE ANYTHING CONTAINING ESTROGEN, AS IT MIGHT CAUSE A RECURRENCE   Shellfish-Derived Products Nausea And Vomiting   Shrimp [Shellfish Allergy] Nausea And Vomiting    FAMILY HISTORY: Family History  Problem Relation Age of Onset   AAA (abdominal aortic aneurysm) Mother    Thyroid disease Mother    Colon cancer Mother    Diabetes Father    Alzheimer's disease Father    Arthritis Sister    Heart disease Sister    Throat cancer Sister    Kidney failure Sister    Diabetes Sister    Diabetes Brother    Diabetes Brother    Breast cancer Cousin       Objective:  *** General: No acute distress.  Patient appears well-groomed.   ***     Shon Millet, DO  CC: [name], PA-C

## 2023-03-23 ENCOUNTER — Encounter: Payer: Self-pay | Admitting: Physician Assistant

## 2023-03-24 ENCOUNTER — Other Ambulatory Visit (HOSPITAL_COMMUNITY): Payer: Self-pay

## 2023-03-24 ENCOUNTER — Ambulatory Visit (INDEPENDENT_AMBULATORY_CARE_PROVIDER_SITE_OTHER): Admitting: Neurology

## 2023-03-24 ENCOUNTER — Ambulatory Visit: Payer: 59 | Admitting: Neurology

## 2023-03-24 ENCOUNTER — Other Ambulatory Visit

## 2023-03-24 ENCOUNTER — Encounter: Payer: Self-pay | Admitting: Neurology

## 2023-03-24 ENCOUNTER — Other Ambulatory Visit: Payer: Self-pay

## 2023-03-24 VITALS — BP 138/54 | HR 75 | Ht 64.0 in | Wt 187.0 lb

## 2023-03-24 DIAGNOSIS — G4486 Cervicogenic headache: Secondary | ICD-10-CM

## 2023-03-24 DIAGNOSIS — M542 Cervicalgia: Secondary | ICD-10-CM | POA: Diagnosis not present

## 2023-03-24 DIAGNOSIS — H02402 Unspecified ptosis of left eyelid: Secondary | ICD-10-CM

## 2023-03-24 DIAGNOSIS — M26609 Unspecified temporomandibular joint disorder, unspecified side: Secondary | ICD-10-CM

## 2023-03-24 MED ORDER — TIZANIDINE HCL 2 MG PO TABS
2.0000 mg | ORAL_TABLET | Freq: Three times a day (TID) | ORAL | 5 refills | Status: DC
  Filled 2023-03-24: qty 90, 30d supply, fill #0
  Filled 2023-04-17: qty 90, 30d supply, fill #1
  Filled 2023-05-17: qty 90, 30d supply, fill #2
  Filled 2023-06-16: qty 90, 30d supply, fill #3
  Filled 2023-07-11: qty 90, 30d supply, fill #4
  Filled 2023-08-11: qty 90, 30d supply, fill #5

## 2023-03-24 NOTE — Patient Instructions (Addendum)
 Increase tizanidine to 1 tablet twice daily for one week, then increase to 1 tablet three times daily.  If no improvement in 4 weeks, contact me and we will increase dose Check myasthenia panel and MuSK antibodies.Your provider has requested that you have labwork completed today. Please go to [facility] ([id number]) on the second floor of this building before leaving the office today. You do not need to check in. If you are not called within 15 minutes please check with the front desk.   Follow up 6 months.

## 2023-03-24 NOTE — Progress Notes (Signed)
 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       Subjective:  Felicia [name] is a 73 year old female with CAD, HTN, HLD, DM II, hypothyroidism, osteoarthritis, depression, anxiety and history of breast cancer and uterine cancer who follows up for headache.  UPDATE: Increased gabapentin. No improvement.  Off and on but occurs daily.   She also notes that she sometimes has left sided periorbital headache as well.  She also notes a little eyelid lag in the left eye as well but no lacrimation or conjunctival injection.  It happens often when she is focused on something such as watching TV or looking on her phone.  May happen in the morning but mostly notices it at night.  Sed rate from 01/31/2023 was 24.  Labs from 03/18/2023 include TSH 3.19, free T3 2.6, free T4 1, and Hgb A1c 9.2.     Current NSAIDS/analgesics:  ASA 81mg  daily, celecoxib, tramadol Current triptans:  none Current ergotamine:  none Current anti-emetic:  Compazine Current muscle relaxants:  tizanidine 2-4mg  at bedtime PRN Current Antihypertensive medications:  none Current Antidepressant medications:  escitalopram 20mg  Current Anticonvulsant medications:  gabapentin 600mg  three times daily Current anti-CGRP:  none Current Antihistamines/Decongestants:  Claritin Other therapy:  none Other medications:  ropinirole 0.5mg  QHS     Caffeine:  no coffee.  Diet cola.  Decaf tea   HISTORY: She has had occasional sharp right sided paroxysmal headaches since childhood.  In 2017, she started developing occipital headaches with neck pain, left arm pain and balance problems.  Found to have  cervical spinal stenosis causing myelopathy and underwent C3-7 fusion in late 2017-early 2018.  Continues to have chronic neck pain and headache.  Neck pain radiates down into both shoulders.  Headache is often diffuse and throbbing but still has occasional sharp right sided headache.  Also may have left eye pain that radiates to top of head.  Sometimes phonophobia but usually not.  No nausea, vomiting, photophobia or visual disturbance.  Always with a persistent dull headache but has exacerbations everyday lasting a few hours.  Takes Tylenol or another "headache medication" daily.  Cannot get injections due to hardware in the cervical spine.  She also has left sided jaw pain with prior diagnosis of TMJ dysfunction but CT sinuses from August 2024 did not reveal TMJ arthropathy.     MRI of brain with and without contrast on 02/14/2021 personally reviewed showed mild chronic small vessel ischemic changes within the cerebral white matter but overall unremarkable.     Past NSAIDS/analgesics:  tramadol Past abortive triptans:  none Past abortive ergotamine:  none Past muscle relaxants:  tizanidine Past anti-emetic:  Zofran 8mg  Past antihypertensive medications:  none Past antidepressant medications:  amitriptyline (helpful) Past anticonvulsant medications:  none Past anti-CGRP:  none Past vitamins/Herbal/Supplements:  none Past antihistamines/decongestants:  none Other past therapies:  none    Family history of headache:  unknown.      PAST MEDICAL HISTORY: Past Medical History:  Diagnosis Date   Adenomatous colon polyp 2015   Anemia    Anxiety    Breast cancer (HCC) 09/2019   left breast IMC   CAD (coronary artery disease)  a. 3/2007s/p DES to the LAD [facility]); b. 01/2017 MV: EF 80%, small, mild apical ant defect w/o ischemia (felt to be breast atten). Low risk.   Depression    DM (diabetes mellitus) (HCC)    Emphysema lung (HCC)    Essential hypertension    GERD  (gastroesophageal reflux disease)    Headache    secondary to neck surgery per patient   High triglycerides    HTN (hypertension)    Hyperlipidemia    Hypothyroid    OA (osteoarthritis)    right knee,hands   Overflow incontinence    PONV (postoperative nausea and vomiting)    s/p gallbladder    RLS (restless legs syndrome)    Urinary urgency    Uterine cancer (HCC) dx'd 2014    MEDICATIONS: Current Outpatient Medications on File Prior to Visit  Medication Sig Dispense Refill   ACCU-CHEK GUIDE test strip USE TO check blood glucose UP TO four times daily AS DIRECTED 100 strip 0   Accu-Chek Softclix Lancets lancets USE TO check blood glucose UP TO four times daily AS DIRECTED 100 each 0   albuterol (VENTOLIN HFA) 108 (90 Base) MCG/ACT inhaler Inhale 2 puffs into the lungs every 6 (six) hours as needed for wheezing or shortness of breath. 6.7 g 2   anastrozole (ARIMIDEX) 1 MG tablet Take 1 tablet (1 mg total) by mouth daily. 90 tablet 4   Ascorbic Acid (VITAMIN C PO) Take 1 tablet by mouth daily.     aspirin EC (ASPIRIN 81) 81 MG tablet Take 1 tablet (81 mg total) by mouth in the morning. 90 tablet 99   azelastine (ASTELIN) 0.1 % nasal spray Place 2 sprays into both nostrils 2 (two) times daily. Use in each nostril as directed 30 mL 11   bisoprolol (ZEBETA) 5 MG tablet Take 1 tablet (5 mg total) by mouth at bedtime. 90 tablet 3   blood glucose meter kit and supplies Dispense based on patient and insurance preference. Use up to four times daily as directed. (FOR ICD-10 E10.9, E11.9). 1 each 0   buPROPion (WELLBUTRIN XL) 150 MG 24 hr tablet Take 1 tablet (150 mg total) by mouth every morning for depression. 30 tablet 1   celecoxib (CELEBREX) 200 MG capsule Take 1 capsule (200 mg total) by mouth 2 (two) times daily. 180 capsule 1   CINNAMON PO Take 1 capsule by mouth daily with supper.     diclofenac sodium (VOLTAREN) 1 % GEL Apply 2-4 g topically 4 (four) times daily as needed (as directed  for pain).     empagliflozin (JARDIANCE) 25 MG TABS tablet Take 1 tablet (25 mg total) by mouth daily. 90 tablet 1   escitalopram (LEXAPRO) 20 MG tablet Take 1 tablet (20 mg total) by mouth daily. 90 tablet 1   fenofibrate (TRICOR) 145 MG tablet Take 1 tablet (145 mg total) by mouth at bedtime. 90 tablet 3   Ferrous Sulfate (IRON PO) Take 1 tablet by mouth daily. alternates days:1 tablet one day and 2 tablets the next day     gabapentin (NEURONTIN) 600 MG tablet Take 1 tablet (600 mg total) by mouth 3 (three) times daily. 90 tablet 0   glipiZIDE (GLUCOTROL XL) 10 MG 24 hr tablet Take 2 tablets (20 mg total) by mouth every morning. 90 tablet 1   glucose blood test strip Use to check blood sugar up to four times daily as directed 100 each 3   HYDROcodone-acetaminophen (NORCO/VICODIN) 5-325 MG tablet  Take 1 tablet by mouth every 6 (six) hours as needed. 15 tablet 0   Incontinence Supply Disposable (DEPEND ADJUSTABLE UNDERWEAR) MISC 1 Application by Does not apply route daily. 16 each 11   insulin glargine (LANTUS SOLOSTAR) 100 UNIT/ML Solostar Pen Start with injecting 4 units under the skin in the evenings. Increase by 2 units every 2 days until monring fasting glucose is to goal (140). Max units 60 per day. 15 mL 11   Insulin Pen Needle (PEN NEEDLES) 32G X 4 MM MISC USE TO INJECT INSULIN 100 each 3   Lancets MISC Use to check blood sugar up to four times daily as directed 100 each 3   levothyroxine (SYNTHROID) 125 MCG tablet Take 1 tablet (125 mcg total) by mouth daily before breakfast. 30 tablet 2   loratadine (ALLERGY RELIEF) 10 MG tablet Take 1 tablet (10 mg total) by mouth every evening. 90 tablet 3   metroNIDAZOLE (METROGEL) 1 % gel Apply 1 application topically as needed (as directed to affected area). 45 g 2   Multiple Vitamins-Calcium (ONE-A-DAY WOMENS PO) Take 1 tablet by mouth daily.     nitroGLYCERIN (NITROSTAT) 0.4 MG SL tablet Dissolve 1 tablet under the tongue every 5 minutes as needed  for chest pain. Max of 3 doses, then 911. 75 tablet 2   omeprazole (PRILOSEC) 40 MG capsule Take 1 capsule (40 mg total) by mouth every evening. 90 capsule 1   omeprazole (PRILOSEC) 40 MG capsule Take 1 capsule (40 mg total) by mouth every evening. 90 capsule 1   polyethylene glycol powder (MIRALAX) 17 GM/SCOOP powder Take 17 g by mouth daily. 255 g 0   rOPINIRole (REQUIP) 0.5 MG tablet Take 1 tablet (0.5 mg total) by mouth at bedtime. 90 tablet 1   Tiotropium Bromide-Olodaterol (STIOLTO RESPIMAT) 2.5-2.5 MCG/ACT AERS Inhale 2 puffs into the lungs daily. 4 g 2   tiZANidine (ZANAFLEX) 2 MG tablet Take 1-2 tablets (2-4 mg total) by mouth at bedtime as needed. 60 tablet 12   traMADol (ULTRAM) 50 MG tablet Take 1 tablet (50 mg total) by mouth every 6 (six) hours as needed for moderate pain (pain score 4-6) or severe pain (pain score 7-10). 30 tablet 0   triamcinolone (KENALOG) 0.1 % paste Apply a small amount to affected area twice daily for 2 - 3 weeks. 5 g 1   TURMERIC PO Take 1 capsule by mouth daily with supper.     Wheat Dextrin (BENEFIBER) POWD 1-2 TBSP work up to 3 x a day, mix in water or liquid 730 g 0   No current facility-administered medications on file prior to visit.    ALLERGIES: Allergies  Allergen Reactions   Chloraprep One Step [Chlorhexidine Gluconate] Rash   Estrogens Other (See Comments)    PATIENT HAS A HISTORY OF CANCER AND HAS BEEN TOLD TO NEVER TAKE ANYTHING CONTAINING ESTROGEN, AS IT MIGHT CAUSE A RECURRENCE   Shellfish-Derived Products Nausea And Vomiting   Shrimp [Shellfish Allergy] Nausea And Vomiting    FAMILY HISTORY: Family History  Problem Relation Age of Onset   AAA (abdominal aortic aneurysm) Mother    Thyroid disease Mother    Colon cancer Mother    Diabetes Father    Alzheimer's disease Father    Arthritis Sister    Heart disease Sister    Throat cancer Sister    Kidney failure Sister    Diabetes Sister    Diabetes Brother    Diabetes Brother  Breast cancer Cousin       Objective:  Blood pressure (!) 138/54, pulse 75, height 5\' 4"  (1.626 m), weight 187 lb (84.8 kg), SpO2 94%. General: No acute distress.  Patient appears well-groomed.   alert and oriented.  Speech fluent and not dysarthric, language intact.  Left ptosis.  Otherwise, CN II-XII intact.      Shon Millet, DO  CC: [name], PA-C

## 2023-03-25 ENCOUNTER — Ambulatory Visit
Admission: RE | Admit: 2023-03-25 | Discharge: 2023-03-25 | Disposition: A | Discharge status: Home / Self Care | Source: Ambulatory Visit | Attending: Physician Assistant

## 2023-03-25 ENCOUNTER — Encounter: Payer: Self-pay | Admitting: Physician Assistant

## 2023-03-25 DIAGNOSIS — E039 Hypothyroidism, unspecified: Secondary | ICD-10-CM | POA: Diagnosis not present

## 2023-03-25 DIAGNOSIS — R490 Dysphonia: Secondary | ICD-10-CM

## 2023-03-25 DIAGNOSIS — R070 Pain in throat: Secondary | ICD-10-CM

## 2023-03-25 DIAGNOSIS — E038 Other specified hypothyroidism: Secondary | ICD-10-CM

## 2023-03-29 LAB — MYASTHENIA GRAVIS PANEL 2
A CHR BINDING ABS: <0.3 nmol/L
ACHR Blocking Abs: <15 %{inhibition} (ref <15)
Acetylchol Modul Ab: <1 %{inhibition}

## 2023-04-01 ENCOUNTER — Other Ambulatory Visit: Payer: Self-pay

## 2023-04-01 ENCOUNTER — Ambulatory Visit: Payer: Self-pay

## 2023-04-01 ENCOUNTER — Ambulatory Visit (INDEPENDENT_AMBULATORY_CARE_PROVIDER_SITE_OTHER): Admitting: Physician Assistant

## 2023-04-01 ENCOUNTER — Other Ambulatory Visit (HOSPITAL_COMMUNITY): Payer: Self-pay

## 2023-04-01 ENCOUNTER — Telehealth: Payer: Self-pay

## 2023-04-01 ENCOUNTER — Encounter: Payer: Self-pay | Admitting: Physician Assistant

## 2023-04-01 VITALS — BP 128/70 | HR 86 | Temp 97.3°F | Ht 64.0 in | Wt 181.8 lb

## 2023-04-01 DIAGNOSIS — E1165 Type 2 diabetes mellitus with hyperglycemia: Secondary | ICD-10-CM | POA: Diagnosis not present

## 2023-04-01 DIAGNOSIS — Z794 Long term (current) use of insulin: Secondary | ICD-10-CM

## 2023-04-01 DIAGNOSIS — M154 Erosive (osteo)arthritis: Secondary | ICD-10-CM

## 2023-04-01 DIAGNOSIS — G8929 Other chronic pain: Secondary | ICD-10-CM | POA: Diagnosis not present

## 2023-04-01 DIAGNOSIS — R197 Diarrhea, unspecified: Secondary | ICD-10-CM

## 2023-04-01 MED ORDER — TRAMADOL HCL 50 MG PO TABS
50.0000 mg | ORAL_TABLET | Freq: Four times a day (QID) | ORAL | 0 refills | Status: DC | PRN
  Filled 2023-04-01: qty 30, 8d supply, fill #0

## 2023-04-01 NOTE — Patient Instructions (Signed)
 Visit Information  Thank you for taking time to visit with me today. Please don't hesitate to contact me if I can be of assistance to you.   Following are the goals we discussed today:   Goals Addressed             This Visit's Progress    Diabetes Management       Patient Goals/Self Care Activities: -Patient/Caregiver will self-administer medications as prescribed as evidenced by self-report/primary caregiver report  -Patient/Caregiver will attend all scheduled provider appointments as evidenced by clinician review of documented attendance to scheduled appointments and patient/caregiver report -Patient/Caregiver will call provider office for new concerns or questions as evidenced by review of documented incoming telephone call notes and patient report  -check blood sugar at prescribed times -check blood sugar if I feel it is too high or too low -record values and write them down take them to all doctor visits    Patient reports  doing okay.  She reports her sugars are up.  A1c  up to 9.2.  Her most recent sugar 176.  Reviewed diabetes management and carb management.  She verbalized understanding.  Follow up with physician scheduled.  Patient continues to get meals on wheels.  Appointment to lipid clinic in May.  No concerns.          Your next appointment is by telephone on 04/24/23 at 1030 am  Please call the care guide team at [phone] if you need to cancel or reschedule your appointment.   If you are experiencing a Mental Health or Behavioral Health Crisis or need someone to talk to, please call the Suicide and Crisis Lifeline: 988   Patient verbalizes understanding of instructions and care plan provided today and agrees to view in MyChart. Active MyChart status and patient understanding of how to access instructions and care plan via MyChart confirmed with patient.     The patient has been provided with contact information for the care management team and has been advised  to call with any health related questions or concerns.   Bary Leriche RN, MSN [facility], [facility] RN Care Manager Direct Dial: [phone]  Fax: [phone] Website: [name]

## 2023-04-01 NOTE — Progress Notes (Unsigned)
 Patient ID: Felicia [name], female    DOB: [birth year], 73 y.o.   MRN: [id number]   Assessment & Plan:  Type 2 diabetes mellitus with hyperglycemia, with long-term current use of insulin (HCC)  Erosive osteoarthritis of multiple sites  Diarrhea, unspecified type  Other chronic pain -     traMADol HCl; Take 1 tablet (50 mg total) by mouth every 6 (six) hours as needed for moderate pain (pain score 4-6) or severe pain (pain score 7-10).  Dispense: 30 tablet; Refill: 0    Assessment and Plan    Type 2 Diabetes Mellitus Long-standing Type 2 Diabetes Mellitus with suboptimal control. Concerns about metformin's impact on gastrointestinal symptoms. Discussed insulin dosing adjustments and endocrinology referral if control remains inadequate. - Increase Lantus to 48 units at night. - Monitor blood glucose levels and report weekly. - Consider discontinuing metformin after current supply to assess impact on diarrhea. - Evaluate blood glucose control in 3 months; consider endocrinology referral if control remains suboptimal. - Inquire about diabetic meal options with [employer] Whe[employer]. - Check insurance coverage for continuous glucose monitor. - Monitor glucose readings - send update next week Lab Results  Component Value Date   HGBA1C 9.2 (A) 03/18/2023   HGBA1C 8.3 (A) 10/23/2022   HGBA1C 10.9 (A) 07/08/2022     Diarrhea Ongoing diarrhea, possibly related to metformin. Gastroenterologist advised anti-diarrheal medication and increased fiber intake. - Consider discontinuing metformin after current supply to assess impact on diarrhea. - Continue fiber intake as advised by gastroenterologist. - Reduce caffeine intake from tea and soda.  Chronic Pain Chronic pain in hip and leg, severe at times. Advised against concurrent use of tramadol and hydrocodone (she had a very short prescription from another provider last year). - Continue tramadol up to three times a day as needed for pain. -  Avoid concurrent use of tramadol and hydrocodone. - Consider scheduling an appointment for hip evaluation. PDMP reviewed today, no red flags, filling appropriately.    Follow-up Follow-up in 3 months to reassess diabetes management and overall health. - Schedule follow-up appointment in 3 months. - Send weekly blood glucose readings. - Contact the office for urgent concerns or significant changes.        Return in about 3 months (around 07/02/2023) for recheck/follow-up.    Subjective:    Chief Complaint  Patient presents with   Diabetes    Pt in office for diabetes 2 wk f/u to discuss management; morning glucose 173 fasting and just before lunch was over 200. Only had tea, toast and cheese for breakfast. Wants to discuss Jardiance and dosage of medication.    HPI Discussed the use of AI scribe software for clinical note transcription with the patient, who gave verbal consent to proceed.  History of Present Illness   Felicia [name] is a 73 year old female with diabetes who presents for management of her diabetes medications.  She is currently managing her diabetes with insulin, metformin, Jardiance, and glipizide. She takes 46 units of insulin and plans to increase to 48 units. Despite thinking metformin was stopped, she continues to take it due to leftover supply. Blood sugar levels have been reaching the 300s before meals. She has not been hospitalized for diabetic ketoacidosis or hypoglycemia but has experienced low blood sugar levels in the past.  She experiences ongoing diarrhea, which she attributes to metformin. A gastroenterologist advised her to take anti-diarrheal medication at bedtime and in the morning, and to increase fiber intake throughout the  day. She has noticed some improvement with the addition of fiber, as her stools are starting to bulk. Her diet includes meals from [employer] Wheels, which sometimes include juice cups that she tries to avoid. She is unsure if they  offer diabetic-friendly options. She consumes diet soda and caffeinated tea, which may contribute to her diarrhea.  She has significant pain in her hip and leg. She also takes tramadol up to three times a day for moderate to severe pain. She plans to consult a specialist for her hip pain.  She has a history of high lipid levels and is scheduled to see a specialist at a lipid clinic. She is also concerned about her eye health and is awaiting a referral to an eye doctor for an eyelid issue.       Past Medical History:  Diagnosis Date   Adenomatous colon polyp 2015   Anemia    Anxiety    Breast cancer (HCC) 09/2019   left breast IMC   CAD (coronary artery disease)    a. 3/2007s/p DES to the LAD [facility]); b. 01/2017 MV: EF 80%, small, mild apical ant defect w/o ischemia (felt to be breast atten). Low risk.   Depression    DM (diabetes mellitus) (HCC)    Emphysema lung (HCC)    Essential hypertension    GERD (gastroesophageal reflux disease)    Headache    secondary to neck surgery per patient   High triglycerides    HTN (hypertension)    Hyperlipidemia    Hypothyroid    OA (osteoarthritis)    right knee,hands   Overflow incontinence    PONV (postoperative nausea and vomiting)    s/p gallbladder    RLS (restless legs syndrome)    Urinary urgency    Uterine cancer (HCC) dx'd 2014    Past Surgical History:  Procedure Laterality Date   BREAST LUMPECTOMY WITH RADIOACTIVE SEED AND SENTINEL LYMPH NODE BIOPSY Left 10/07/2019   Procedure: LEFT BREAST LUMPECTOMY WITH RADIOACTIVE SEED AND SENTINEL LYMPH NODE BIOPSY;  Surgeon: Emelia Loron, MD;  Location: [facility];  Service: General;  Laterality: Left;   CHOLECYSTECTOMY     CORONARY STENT PLACEMENT     CORONARY/GRAFT ACUTE MI REVASCULARIZATION     FINGER ARTHRODESIS Right 06/11/2019   Procedure: ARTHRODESIS INDEX FINGER DISTAL PHALANGEAL JOINT;  Surgeon: Betha Loa, MD;  Location: [facility];  Service: Orthopedics;  Laterality: Right;   FOOT SURGERY Left    LEFT HEART CATH AND CORONARY ANGIOGRAPHY N/A 02/16/2018   Procedure: LEFT HEART CATH AND CORONARY ANGIOGRAPHY;  Surgeon: Runell Gess, MD;  Location: [facility];  Service: Cardiovascular;  Laterality: N/A;   LEFT HEART CATH AND CORONARY ANGIOGRAPHY N/A 05/01/2020   Procedure: LEFT HEART CATH AND CORONARY ANGIOGRAPHY;  Surgeon: Runell Gess, MD;  Location: [facility];  Service: Cardiovascular;  Laterality: N/A;   PORTACATH PLACEMENT Right 11/23/2019   Procedure: INSERTION PORT-A-CATH WITH ULTRASOUND GUIDANCE;  Surgeon: Emelia Loron, MD;  Location: [facility];  Service: General;  Laterality: Right;   REPLACEMENT TOTAL KNEE Right    SPINE SURGERY     TOTAL ABDOMINAL HYSTERECTOMY     FOR UTERUS CANCER   TOTAL HIP ARTHROPLASTY Left 2015    Family History  Problem Relation Age of Onset   AAA (abdominal aortic aneurysm) Mother    Thyroid disease Mother    Colon cancer Mother    Diabetes Father  Alzheimer's disease Father    Arthritis Sister    Heart disease Sister    Throat cancer Sister    Kidney failure Sister    Diabetes Sister    Diabetes Brother    Diabetes Brother    Breast cancer Cousin     Social History   Tobacco Use   Smoking status: Never   Smokeless tobacco: Never  Vaping Use   Vaping status: Never Used  Substance Use Topics   Alcohol use: Yes    Comment: OCCASIONALLY   Drug use: No     Allergies  Allergen Reactions   Chloraprep One Step [Chlorhexidine Gluconate] Rash   Estrogens Other (See Comments)    PATIENT HAS A HISTORY OF CANCER AND HAS BEEN TOLD TO NEVER TAKE ANYTHING CONTAINING ESTROGEN, AS IT MIGHT CAUSE A RECURRENCE   Shellfish-Derived Products Nausea And Vomiting   Shrimp [Shellfish Allergy] Nausea And Vomiting    Review of Systems NEGATIVE UNLESS OTHERWISE INDICATED IN HPI      Objective:     BP 128/70 (BP Location:  Left Arm, Patient Position: Sitting, Cuff Size: Normal)   Pulse 86   Temp (!) 97.3 F (36.3 C) (Temporal)   Ht 5\' 4"  (1.626 m)   Wt 181 lb 12.8 oz (82.5 kg)   SpO2 99%   BMI 31.21 kg/m   Wt Readings from Last 3 Encounters:  04/01/23 181 lb 12.8 oz (82.5 kg)  03/24/23 187 lb (84.8 kg)  03/18/23 184 lb 9.6 oz (83.7 kg)    BP Readings from Last 3 Encounters:  04/01/23 128/70  03/24/23 (!) 138/54  03/18/23 132/66     Physical Exam Vitals and nursing note reviewed.  Constitutional:      General: She is not in acute distress.    Appearance: Normal appearance. She is not ill-appearing.  HENT:     Head: Normocephalic.     Right Ear: External ear normal.     Left Ear: External ear normal.     Nose: No congestion.     Mouth/Throat:     Mouth: Mucous membranes are moist.     Pharynx: No oropharyngeal exudate or posterior oropharyngeal erythema.  Eyes:     Extraocular Movements: Extraocular movements intact.     Conjunctiva/sclera: Conjunctivae normal.     Pupils: Pupils are equal, round, and reactive to light.  Cardiovascular:     Rate and Rhythm: Normal rate and regular rhythm.     Pulses: Normal pulses.     Heart sounds: Normal heart sounds. No murmur heard. Pulmonary:     Effort: Pulmonary effort is normal. No respiratory distress.     Breath sounds: Normal breath sounds. No wheezing.  Musculoskeletal:     Cervical back: Normal range of motion.  Skin:    General: Skin is warm.  Neurological:     Mental Status: She is alert and oriented to person, place, and time.  Psychiatric:        Mood and Affect: Mood normal.        Behavior: Behavior normal.       Kyshon Tolliver M Mohit Zirbes, PA-C

## 2023-04-01 NOTE — Patient Instructions (Addendum)
 Eyelid closing situation referral - please call: Address: [address], [place], [place] [address] Phone: [phone] Fax: [phone] for all of the offices    YOUR PLAN:  -TYPE 2 DIABETES MELLITUS: Type 2 Diabetes Mellitus is a condition where your body does not use insulin properly, leading to high blood sugar levels.  --We have decided to increase your Lantus insulin to 48 units at night and monitor your blood glucose levels weekly. You can increase your lantus by two units every two days until fasting AM sugar is 140 or less. --STOP Metformin and see if your diarrhea resolves. --Please check if Meals on Wheels offers diabetic-friendly options and see if your insurance covers a continuous glucose monitor.  We will evaluate your blood glucose control in 3 months and may refer you to an endocrinologist if needed.  -DIARRHEA: Diarrhea is frequent, loose, or watery bowel movements. It may be related to your metformin. We suggest you continue taking fiber as advised by your gastroenterologist and reduce your intake of caffeinated tea and soda. We will consider stopping metformin after your current supply to see if it improves your symptoms.  -CHRONIC PAIN: Chronic pain is long-lasting pain that can be severe and affect your daily activities. You have significant pain in your hip and leg. We recommend continuing tramadol up to three times a day as needed for pain but avoid using tramadol and any left-over hydrocodone together. Please schedule an appointment for a hip evaluation with a specialist.  INSTRUCTIONS:  Please schedule a follow-up appointment in 3 months to reassess your diabetes management and overall health. Send [place] your weekly blood glucose readings and contact our office if you have any urgent concerns or significant changes.

## 2023-04-01 NOTE — Telephone Encounter (Signed)
-----   Message from Cira Servant sent at 03/31/2023  7:13 AM EDT ----- Myasthenia gravis panel is negative.  However, the anti-MUSK is not a part of this panel.  Would like to check for anti-MuSK antibodies as well

## 2023-04-01 NOTE — Patient Outreach (Signed)
 Care Coordination   Follow Up Visit Note   04/01/2023 Name: Felicia [name] MRN: [id number] DOB: Feb 06, 1950  Felicia [name] is a 73 y.o. year old female who sees Allwardt, Crist Infante, PA-C for primary care. I spoke with  [name] by phone today.  What matters to the patients health and wellness today?  Better sugar management    Goals Addressed             This Visit's Progress    Diabetes Management       Patient Goals/Self Care Activities: -Patient/Caregiver will self-administer medications as prescribed as evidenced by self-report/primary caregiver report  -Patient/Caregiver will attend all scheduled provider appointments as evidenced by clinician review of documented attendance to scheduled appointments and patient/caregiver report -Patient/Caregiver will call provider office for new concerns or questions as evidenced by review of documented incoming telephone call notes and patient report  -check blood sugar at prescribed times -check blood sugar if I feel it is too high or too low -record values and write them down take them to all doctor visits    Patient reports  doing okay.  She reports her sugars are up.  A1c  up to 9.2.  Her most recent sugar 176.  Reviewed diabetes management and carb management.  She verbalized understanding.  Follow up with physician scheduled.  Patient continues to get meals on wheels.  Appointment to lipid clinic in May.  No concerns.          SDOH assessments and interventions completed:  Yes     Care Coordination Interventions:  Yes, provided   Follow up plan: Follow up call scheduled for April    Encounter Outcome:  Patient Visit Completed   Bary Leriche RN, MSN [facility], [facility] RN Care Manager Direct Dial: [phone]  Fax: [phone] Website: [name]

## 2023-04-03 ENCOUNTER — Other Ambulatory Visit: Payer: Self-pay | Admitting: Physician Assistant

## 2023-04-04 ENCOUNTER — Other Ambulatory Visit: Payer: Self-pay

## 2023-04-04 ENCOUNTER — Other Ambulatory Visit (HOSPITAL_COMMUNITY): Payer: Self-pay

## 2023-04-04 MED ORDER — EMPAGLIFLOZIN 25 MG PO TABS
25.0000 mg | ORAL_TABLET | Freq: Every day | ORAL | 1 refills | Status: DC
  Filled 2023-04-04: qty 90, 90d supply, fill #0
  Filled 2023-06-26: qty 90, 90d supply, fill #1

## 2023-04-04 NOTE — Progress Notes (Signed)
 No answer at 4:21 04/04/2023

## 2023-04-04 NOTE — Progress Notes (Signed)
 No answer at 4:20

## 2023-04-07 ENCOUNTER — Other Ambulatory Visit: Payer: Self-pay

## 2023-04-07 DIAGNOSIS — H02402 Unspecified ptosis of left eyelid: Secondary | ICD-10-CM

## 2023-04-07 DIAGNOSIS — G4486 Cervicogenic headache: Secondary | ICD-10-CM

## 2023-04-08 ENCOUNTER — Ambulatory Visit: Payer: 59 | Admitting: Dietician

## 2023-04-12 ENCOUNTER — Other Ambulatory Visit: Payer: Self-pay | Admitting: Physician Assistant

## 2023-04-12 DIAGNOSIS — F32A Depression, unspecified: Secondary | ICD-10-CM

## 2023-04-14 ENCOUNTER — Other Ambulatory Visit (HOSPITAL_COMMUNITY): Payer: Self-pay

## 2023-04-14 ENCOUNTER — Other Ambulatory Visit: Payer: Self-pay

## 2023-04-14 MED ORDER — BUPROPION HCL ER (XL) 150 MG PO TB24
150.0000 mg | ORAL_TABLET | Freq: Every morning | ORAL | 1 refills | Status: DC
  Filled 2023-04-14: qty 30, 30d supply, fill #0
  Filled 2023-07-07: qty 30, 30d supply, fill #1

## 2023-04-15 DIAGNOSIS — M67431 Ganglion, right wrist: Secondary | ICD-10-CM | POA: Diagnosis not present

## 2023-04-15 DIAGNOSIS — M1811 Unilateral primary osteoarthritis of first carpometacarpal joint, right hand: Secondary | ICD-10-CM | POA: Diagnosis not present

## 2023-04-15 DIAGNOSIS — M19031 Primary osteoarthritis, right wrist: Secondary | ICD-10-CM | POA: Diagnosis not present

## 2023-04-16 ENCOUNTER — Other Ambulatory Visit (HOSPITAL_COMMUNITY): Payer: Self-pay

## 2023-04-17 ENCOUNTER — Other Ambulatory Visit: Payer: Self-pay

## 2023-04-17 ENCOUNTER — Other Ambulatory Visit: Payer: Self-pay | Admitting: Neurology

## 2023-04-17 ENCOUNTER — Other Ambulatory Visit (HOSPITAL_COMMUNITY): Payer: Self-pay

## 2023-04-17 MED ORDER — GABAPENTIN 600 MG PO TABS
600.0000 mg | ORAL_TABLET | Freq: Three times a day (TID) | ORAL | 4 refills | Status: DC
  Filled 2023-04-17: qty 90, 30d supply, fill #0
  Filled 2023-05-13: qty 90, 30d supply, fill #1
  Filled 2023-06-12: qty 90, 30d supply, fill #2
  Filled 2023-07-10: qty 90, 30d supply, fill #3
  Filled 2023-08-04: qty 90, 30d supply, fill #4

## 2023-04-18 ENCOUNTER — Other Ambulatory Visit: Payer: Self-pay | Admitting: Physician Assistant

## 2023-04-18 ENCOUNTER — Other Ambulatory Visit: Payer: Self-pay

## 2023-04-18 ENCOUNTER — Encounter: Payer: Self-pay | Admitting: Oncology

## 2023-04-18 ENCOUNTER — Other Ambulatory Visit (HOSPITAL_COMMUNITY): Payer: Self-pay

## 2023-04-18 MED ORDER — GLIPIZIDE ER 10 MG PO TB24
20.0000 mg | ORAL_TABLET | Freq: Every morning | ORAL | 1 refills | Status: DC
  Filled 2023-04-18: qty 90, 45d supply, fill #0
  Filled 2023-06-02: qty 90, 45d supply, fill #1

## 2023-04-22 ENCOUNTER — Other Ambulatory Visit (HOSPITAL_COMMUNITY): Payer: Self-pay

## 2023-04-22 ENCOUNTER — Ambulatory Visit (HOSPITAL_BASED_OUTPATIENT_CLINIC_OR_DEPARTMENT_OTHER): Payer: 59 | Admitting: Pulmonary Disease

## 2023-04-22 ENCOUNTER — Other Ambulatory Visit: Payer: Self-pay

## 2023-04-22 ENCOUNTER — Encounter (HOSPITAL_BASED_OUTPATIENT_CLINIC_OR_DEPARTMENT_OTHER): Payer: Self-pay | Admitting: Pulmonary Disease

## 2023-04-22 VITALS — BP 124/78 | HR 78 | Ht 64.0 in | Wt 185.8 lb

## 2023-04-22 DIAGNOSIS — J432 Centrilobular emphysema: Secondary | ICD-10-CM | POA: Diagnosis not present

## 2023-04-22 MED ORDER — STIOLTO RESPIMAT 2.5-2.5 MCG/ACT IN AERS
2.0000 | INHALATION_SPRAY | Freq: Every day | RESPIRATORY_TRACT | 11 refills | Status: AC
  Filled 2023-04-22: qty 4, 30d supply, fill #0
  Filled 2023-05-23: qty 4, 30d supply, fill #1
  Filled 2023-06-23: qty 4, 30d supply, fill #2
  Filled 2023-07-16: qty 4, 30d supply, fill #3
  Filled 2023-08-15: qty 4, 30d supply, fill #4
  Filled 2023-09-15: qty 4, 30d supply, fill #5
  Filled 2023-10-10: qty 4, 30d supply, fill #6
  Filled 2023-11-09: qty 4, 30d supply, fill #7
  Filled 2023-12-09: qty 4, 30d supply, fill #8
  Filled 2024-01-02: qty 4, 30d supply, fill #9
  Filled 2024-02-01 – 2024-02-11 (×3): qty 4, 30d supply, fill #10

## 2023-04-22 NOTE — Patient Instructions (Signed)
 Emphysema - improved respiratory symptoms --CONTINUE Stiolto TWO puffs ONCE a day. REFILLED --CONTINUE Albuterol TWO puffs AS NEEDED for shortness of breath or wheezing.  --CONTINUE mucinex as needed for chest congestion

## 2023-04-22 NOTE — Progress Notes (Signed)
 Subjective:   PATIENT ID: Felicia [name] GENDER: female DOB: [birth year], MRN: [id number]   HPI  Chief Complaint  Patient presents with   Follow-up    Centrilobular emphysema   Reason for Visit: Follow-up  [name] is a 73 year old female never smoker with left breast cancer s/p radiation, CAD, DM and HTN who presents for follow-up  Synopsis: 2022 S/p left breast radiation and developed SOB/ left chest pain. Emphysema on CT. Started on Advair. Self discontinued Advair after improved sx however restarted in Nov but feels ineffective. Started on Spiriva 2023 Breo added on to Spiriva in Jan for wheezing and "lung pain" which improved by 90%. Changed to [facility] in June due to hoarseness  05/03/22 Since our last visit she is not using inhalers regularly. Overall she feels well off inhalers except when triggered by environmental factors including pollen. Will intermittently use Stiolto as needed on her bad days when she has chest tightness. Never uses the albuterol inhaler. No exacerbations requiring steroids or antibiotics.   02/03/23 Since our last visit she reports she is doing well overall. Has been doing well until the last three weeks when she started using albuterol daily for cough and shortness of breath. No wheezing.  04/22/23 Since our last visit overall doing well. She is compliant with Stiolto daily. Not using albuterol anymore. Denies cough, shortness of breath or wheezing. Seasonal changes will effect her. Pollen will trigger her. 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  Past Medical History:  Diagnosis Date   Adenomatous colon  polyp 2015   Anemia    Anxiety    Breast cancer (HCC) 09/2019   left breast IMC   CAD (coronary artery disease)    a. 3/2007s/p DES to the LAD [facility]); b. 01/2017 MV: EF 80%, small, mild apical ant defect w/o ischemia (felt to be breast atten). Low risk.   Depression    DM (diabetes mellitus) (HCC)    Emphysema lung (HCC)    Essential hypertension    GERD (gastroesophageal reflux disease)    Headache    secondary to neck surgery per patient   High triglycerides    HTN (hypertension)    Hyperlipidemia    Hypothyroid    OA (osteoarthritis)    right knee,hands   Overflow incontinence    PONV (postoperative nausea and vomiting)    s/p gallbladder    RLS (restless legs syndrome)    Urinary urgency    Uterine cancer (HCC) dx'd 2014     Family History  Problem Relation Age of Onset   AAA (abdominal aortic aneurysm) Mother    Thyroid disease Mother    Colon cancer Mother    Diabetes Father    Alzheimer's disease Father    Arthritis Sister    Heart disease Sister    Throat cancer Sister    Kidney failure Sister    Diabetes Sister    Diabetes Brother    Diabetes Brother    Breast cancer Cousin      Social History   Occupational History   Occupation: retired  Tobacco Use   Smoking status: Never  Smokeless tobacco: Never  Vaping Use   Vaping status: Never Used  Substance and Sexual Activity   Alcohol use: Yes    Comment: OCCASIONALLY   Drug use: No   Sexual activity: Not Currently    Birth control/protection: Surgical    Comment: hyst    Allergies  Allergen Reactions   Chloraprep One Step [Chlorhexidine Gluconate] Rash   Estrogens Other (See Comments)    PATIENT HAS A HISTORY OF CANCER AND HAS BEEN TOLD TO NEVER TAKE ANYTHING CONTAINING ESTROGEN, AS IT MIGHT CAUSE A RECURRENCE   Shellfish-Derived Products Nausea And Vomiting   Shrimp [Shellfish Allergy] Nausea And Vomiting     Outpatient Medications Prior to Visit  Medication Sig Dispense Refill    ACCU-CHEK GUIDE test strip USE TO check blood glucose UP TO four times daily AS DIRECTED 100 strip 0   Accu-Chek Softclix Lancets lancets USE TO check blood glucose UP TO four times daily AS DIRECTED 100 each 0   albuterol (VENTOLIN HFA) 108 (90 Base) MCG/ACT inhaler Inhale 2 puffs into the lungs every 6 (six) hours as needed for wheezing or shortness of breath. 6.7 g 2   anastrozole (ARIMIDEX) 1 MG tablet Take 1 tablet (1 mg total) by mouth daily. 90 tablet 4   Ascorbic Acid (VITAMIN C PO) Take 1 tablet by mouth daily.     aspirin EC (ASPIRIN 81) 81 MG tablet Take 1 tablet (81 mg total) by mouth in the morning. 90 tablet 99   azelastine (ASTELIN) 0.1 % nasal spray Place 2 sprays into both nostrils 2 (two) times daily. Use in each nostril as directed 30 mL 11   bisoprolol (ZEBETA) 5 MG tablet Take 1 tablet (5 mg total) by mouth at bedtime. 90 tablet 3   blood glucose meter kit and supplies Dispense based on patient and insurance preference. Use up to four times daily as directed. (FOR ICD-10 E10.9, E11.9). 1 each 0   buPROPion (WELLBUTRIN XL) 150 MG 24 hr tablet Take 1 tablet (150 mg total) by mouth every morning for depression. 30 tablet 1   celecoxib (CELEBREX) 200 MG capsule Take 1 capsule (200 mg total) by mouth 2 (two) times daily. 180 capsule 1   CINNAMON PO Take 1 capsule by mouth daily with supper.     diclofenac sodium (VOLTAREN) 1 % GEL Apply 2-4 g topically 4 (four) times daily as needed (as directed for pain).     empagliflozin (JARDIANCE) 25 MG TABS tablet Take 1 tablet (25 mg total) by mouth daily. 90 tablet 1   escitalopram (LEXAPRO) 20 MG tablet Take 1 tablet (20 mg total) by mouth daily. 90 tablet 1   fenofibrate (TRICOR) 145 MG tablet Take 1 tablet (145 mg total) by mouth at bedtime. 90 tablet 3   Ferrous Sulfate (IRON PO) Take 1 tablet by mouth daily. alternates days:1 tablet one day and 2 tablets the next day     gabapentin (NEURONTIN) 600 MG tablet Take 1 tablet (600 mg total)  by mouth 3 (three) times daily. 90 tablet 4   glipiZIDE (GLUCOTROL XL) 10 MG 24 hr tablet Take 2 tablets (20 mg total) by mouth every morning. 90 tablet 1   glucose blood test strip Use to check blood sugar up to four times daily as directed 100 each 3   HYDROcodone-acetaminophen (NORCO/VICODIN) 5-325 MG tablet Take 1 tablet by mouth every 6 (six) hours as needed. 15 tablet 0   Incontinence Supply Disposable (DEPEND ADJUSTABLE UNDERWEAR) MISC 1 Application  by Does not apply route daily. 16 each 11   insulin glargine (LANTUS SOLOSTAR) 100 UNIT/ML Solostar Pen Start with injecting 4 units under the skin in the evenings. Increase by 2 units every 2 days until monring fasting glucose is to goal (140). Max units 60 per day. 15 mL 11   Insulin Pen Needle (PEN NEEDLES) 32G X 4 MM MISC USE TO INJECT INSULIN 100 each 3   Lancets MISC Use to check blood sugar up to four times daily as directed 100 each 3   levothyroxine (SYNTHROID) 125 MCG tablet Take 1 tablet (125 mcg total) by mouth daily before breakfast. 30 tablet 2   loratadine (ALLERGY RELIEF) 10 MG tablet Take 1 tablet (10 mg total) by mouth every evening. 90 tablet 3   metroNIDAZOLE (METROGEL) 1 % gel Apply 1 application topically as needed (as directed to affected area). 45 g 2   Multiple Vitamins-Calcium (ONE-A-DAY WOMENS PO) Take 1 tablet by mouth daily.     nitroGLYCERIN (NITROSTAT) 0.4 MG SL tablet Dissolve 1 tablet under the tongue every 5 minutes as needed for chest pain. Max of 3 doses, then 911. 75 tablet 2   omeprazole (PRILOSEC) 40 MG capsule Take 1 capsule (40 mg total) by mouth every evening. 90 capsule 1   omeprazole (PRILOSEC) 40 MG capsule Take 1 capsule (40 mg total) by mouth every evening. 90 capsule 1   polyethylene glycol powder (MIRALAX) 17 GM/SCOOP powder Take 17 g by mouth daily. 255 g 0   rOPINIRole (REQUIP) 0.5 MG tablet Take 1 tablet (0.5 mg total) by mouth at bedtime. 90 tablet 1   tiZANidine (ZANAFLEX) 2 MG tablet Take 1  tablet (2 mg total) by mouth 3 (three) times daily. 90 tablet 5   traMADol (ULTRAM) 50 MG tablet Take 1 tablet (50 mg total) by mouth every 6 (six) hours as needed for moderate pain (pain score 4-6) or severe pain (pain score 7-10). 30 tablet 0   triamcinolone (KENALOG) 0.1 % paste Apply a small amount to affected area twice daily for 2 - 3 weeks. 5 g 1   TURMERIC PO Take 1 capsule by mouth daily with supper.     Wheat Dextrin (BENEFIBER) POWD 1-2 TBSP work up to 3 x a day, mix in water or liquid 730 g 0   Tiotropium Bromide-Olodaterol (STIOLTO RESPIMAT) 2.5-2.5 MCG/ACT AERS Inhale 2 puffs into the lungs daily. 4 g 2   No facility-administered medications prior to visit.    Review of Systems  Constitutional:  Negative for chills, diaphoresis, fever, malaise/fatigue and weight loss.  HENT:  Negative for congestion.   Respiratory:  Negative for cough, hemoptysis, sputum production, shortness of breath and wheezing.   Cardiovascular:  Negative for chest pain, palpitations and leg swelling.     Objective:   Vitals:   04/22/23 1444  BP: 124/78  Pulse: 78  SpO2: 96%  Weight: 185 lb 12.8 oz (84.3 kg)  Height: 5\' 4"  (1.626 m)    SpO2: 96 %  Physical Exam: General: Well-appearing, no acute distress HENT: [place], AT Eyes: EOMI, no scleral icterus Respiratory: Clear to auscultation bilaterally.  No crackles, wheezing or rales Cardiovascular: RRR, -M/R/G, no JVD Extremities:-Edema,-tenderness Neuro: AAO x4, CNII-XII grossly intact Psych: Normal mood, normal affect  Data Reviewed:  Imaging: CTA 04/29/20 - No pulmonary emboli. Dependent bibasilar atelectasis. Diffuse emphysema with mosaic attenuation CT Chest 01/11/21 - Multifocal reticulonodular opacities CT CAP 08/09/21 - Visualized parenchymal resolution of opacities with minimal subpleural radiation  fibrosis in LUL  PFT: 08/10/20 FVC 2.61 (87%) FEV1 2.39 (105%) Ratio 87  TLC 88% DLCO 117% Interpretation: Normal spirometry. Increased  DLCO suggestive of asthma No significant bronchodilator response  Labs: CBC    Component Value Date/Time   WBC 12.7 (H) 12/18/2022 1510   RBC 4.68 12/18/2022 1510   HGB 14.7 12/18/2022 1510   HGB 13.8 08/02/2021 1036   HCT 44.7 12/18/2022 1510   PLT 341.0 12/18/2022 1510   PLT 216 08/02/2021 1036   MCV 95.4 12/18/2022 1510   MCH 31.8 08/02/2021 1036   MCHC 32.9 12/18/2022 1510   RDW 14.9 12/18/2022 1510   LYMPHSABS 8.6 (H) 12/18/2022 1510   MONOABS 0.6 12/18/2022 1510   EOSABS 0.1 12/18/2022 1510   BASOSABS 0.1 12/18/2022 1510   Echocardiogram 11/27/20 EF 60-65%. No valvular or WMA  Assessment & Plan:   Discussion: 73 year old female never smoker with hx CAD s/p stent to LAD, hx breast s/p breast cancer s/p left sided radiation completed in 04/2020 who presents for follow-up emphysema +/- occupational asthma +/- radiation pneumonitis. Symptoms usually worsens seasonally. Well controlled LAMA/LABA.  Discussed clinical course and management of COPD including bronchodilator regimen, preventive care and action plan for exacerbation.  ICS - hoarseness Stiolto - no issues but d/c'd when symptoms improved  Emphysema - improved respiratory symptoms --CONTINUE Stiolto TWO puffs ONCE a day. REFILLED --CONTINUE Albuterol TWO puffs AS NEEDED for shortness of breath or wheezing.  --CONTINUE mucinex as needed for chest congestion   Health Maintenance Immunization History  Administered Date(s) Administered   Fluad Quad(high Dose 65+) 09/25/2021   Fluad Trivalent(High Dose 65+) 10/23/2022   Influenza, High Dose Seasonal PF 12/31/2016, 10/24/2017   PNEUMOCOCCAL CONJUGATE-20 09/25/2021   Pneumococcal Conjugate-13 10/24/2017   Td 07/12/2005, 02/08/2019   CT Lung Screen - not indicated  No orders of the defined types were placed in this encounter.  Meds ordered this encounter  Medications   Tiotropium Bromide-Olodaterol (STIOLTO RESPIMAT) 2.5-2.5 MCG/ACT AERS    Sig: Inhale 2 puffs  into the lungs daily.    Dispense:  4 g    Refill:  11   Return in about 6 months (around 10/22/2023).   I have spent a total time of 20-minutes on the day of the appointment including chart review, data review, collecting history, coordinating care and discussing medical diagnosis and plan with the patient/family. Past medical history, allergies, medications were reviewed. Pertinent imaging, labs and tests included in this note have been reviewed and interpreted independently by me.  Indio Santilli Mechele Collin, MD [facility] 04/22/2023

## 2023-04-23 ENCOUNTER — Other Ambulatory Visit: Payer: Self-pay

## 2023-04-23 ENCOUNTER — Ambulatory Visit: Payer: 59 | Attending: Physician Assistant

## 2023-04-23 DIAGNOSIS — R293 Abnormal posture: Secondary | ICD-10-CM | POA: Insufficient documentation

## 2023-04-23 DIAGNOSIS — M62838 Other muscle spasm: Secondary | ICD-10-CM | POA: Insufficient documentation

## 2023-04-23 DIAGNOSIS — M6281 Muscle weakness (generalized): Secondary | ICD-10-CM | POA: Diagnosis not present

## 2023-04-23 DIAGNOSIS — R279 Unspecified lack of coordination: Secondary | ICD-10-CM | POA: Insufficient documentation

## 2023-04-23 NOTE — Therapy (Signed)
 OUTPATIENT PHYSICAL THERAPY FEMALE PELVIC EVALUATION   Patient Name: Felicia [name] MRN: [id number] DOB:1950/10/09, 73 y.o., female Today's Date: 04/23/2023  END OF SESSION:  PT End of Session - 04/23/23 1404     Visit Number 1    Date for PT Re-Evaluation 07/16/23    Authorization Type [employer] Medicare dual complete    PT Start Time 1400    PT Stop Time 1440    PT Time Calculation (min) 40 min             Past Medical History:  Diagnosis Date   Adenomatous colon polyp 2015   Anemia    Anxiety    Breast cancer (HCC) 09/2019   left breast IMC   CAD (coronary artery disease)    a. 3/2007s/p DES to the [facility]); b. 01/2017 MV: EF 80%, small, mild apical ant defect w/o ischemia (felt to be breast atten). Low risk.   Depression    DM (diabetes mellitus) (HCC)    Emphysema lung (HCC)    Essential hypertension    GERD (gastroesophageal reflux disease)    Headache    secondary to neck surgery per patient   High triglycerides    HTN (hypertension)    Hyperlipidemia    Hypothyroid    OA (osteoarthritis)    right knee,hands   Overflow incontinence    PONV (postoperative nausea and vomiting)    s/p gallbladder    RLS (restless legs syndrome)    Urinary urgency    Uterine cancer (HCC) dx'd 2014   Past Surgical History:  Procedure Laterality Date   BREAST LUMPECTOMY WITH RADIOACTIVE SEED AND SENTINEL LYMPH NODE BIOPSY Left 10/07/2019   Procedure: LEFT BREAST LUMPECTOMY WITH RADIOACTIVE SEED AND SENTINEL LYMPH NODE BIOPSY;  Surgeon: Emelia Loron, MD;  Location: [facility];  Service: General;  Laterality: Left;   CHOLECYSTECTOMY     CORONARY STENT PLACEMENT     CORONARY/GRAFT ACUTE MI REVASCULARIZATION     FINGER ARTHRODESIS Right 06/11/2019   Procedure: ARTHRODESIS INDEX FINGER DISTAL PHALANGEAL JOINT;  Surgeon: Betha Loa, MD;  Location: [facility];  Service: Orthopedics;  Laterality: Right;   FOOT SURGERY Left    LEFT HEART  CATH AND CORONARY ANGIOGRAPHY N/A 02/16/2018   Procedure: LEFT HEART CATH AND CORONARY ANGIOGRAPHY;  Surgeon: Runell Gess, MD;  Location: [facility];  Service: Cardiovascular;  Laterality: N/A;   LEFT HEART CATH AND CORONARY ANGIOGRAPHY N/A 05/01/2020   Procedure: LEFT HEART CATH AND CORONARY ANGIOGRAPHY;  Surgeon: Runell Gess, MD;  Location: [facility];  Service: Cardiovascular;  Laterality: N/A;   PORTACATH PLACEMENT Right 11/23/2019   Procedure: INSERTION PORT-A-CATH WITH ULTRASOUND GUIDANCE;  Surgeon: Emelia Loron, MD;  Location:  SURGERY CENTER;  Service: General;  Laterality: Right;   REPLACEMENT TOTAL KNEE Right    SPINE SURGERY     TOTAL ABDOMINAL HYSTERECTOMY     FOR UTERUS CANCER   TOTAL HIP ARTHROPLASTY Left 2015   Patient Active Problem List   Diagnosis Date Noted   Referred otalgia of left ear 11/19/2022   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   Erosive osteoarthritis of multiple sites 09/26/2021   Frequent headaches 05/31/2021   Bilateral impacted cerumen 02/22/2021   Nasal septal perforation 02/22/2021   Postnasal drip 02/22/2021   Hoarseness 01/25/2021   Hyperglycemia due to type 2 diabetes mellitus (HCC) 01/18/2021   Mixed simple and mucopurulent chronic bronchitis (HCC) 12/27/2020  Centrilobular emphysema (HCC) 07/31/2020   Shortness of breath 07/31/2020   Tongue lesion 04/30/2020   Hypothyroid    Anxiety    Anxiety and depression    Port-A-Cath in place 11/24/2019   Hepatic steatosis 09/28/2019   Malignant neoplasm of upper-outer quadrant of left breast in female, estrogen receptor positive (HCC) 09/02/2019   Trigger index finger of right hand 05/05/2019   Primary osteoarthritis of first carpometacarpal joint of right hand 05/05/2019   Osteoarthritis of finger of right hand 05/05/2019   Endometrial cancer (HCC) 10/23/2018   Cirrhosis of liver without ascites (HCC) 10/23/2018    Diarrhea 10/23/2018   Chest pain 02/15/2018   Coronary artery disease involving native coronary artery of native heart without angina pectoris 11/18/2017   Hyperlipidemia LDL goal <70 11/18/2017   Diabetes mellitus treated with insulin and oral medication (HCC) 11/18/2017   Dizziness 11/18/2017   Type 2 diabetes mellitus without complication, without long-term current use of insulin (HCC) 11/18/2017    PCP: Bary Leriche, PA-C  REFERRING PROVIDER: Doree Albee, PA-C   REFERRING DIAG: K52.9 (ICD-10-CM) - Chronic diarrhea  THERAPY DIAG:  Muscle weakness (generalized)  Unspecified lack of coordination  Abnormal posture  Other muscle spasm  Rationale for Evaluation and Treatment: Rehabilitation  ONSET DATE: 3 years ago  SUBJECTIVE:                                                                                                                                                                                           SUBJECTIVE STATEMENT: Pt reports diarrhea with fecal incontinence since chemo-radiation 3 years ago that has become worse in the last year. She feels like she does not empty completely, so if she passes gas she will also leak stool. She was instructed to start taking fiber and miralax, and she has been working to find an appropriate balance between these two. She almost always has small amount of stool when she urinates, but yesterday she had less of that and then a more formed larger stool.  Fluid intake:   PAIN:  Are you having pain? Yes NPRS scale: 6/10 Pain location:  hips, low back, LE  Pain type: aching Pain description: constant   Aggravating factors: walking, standing  Relieving factors: lying down   PRECAUTIONS: Other: hx of uterine and breast cancer   RED FLAGS: None   WEIGHT BEARING RESTRICTIONS: No  FALLS:  Has patient fallen in last 6 months? Yes. Number of falls 1x, 12/20/23 (tripped over curb)  OCCUPATION: not working  ACTIVITY  LEVEL : none currently  PLOF: Independent  PATIENT GOALS: control over bowel movements  PERTINENT HISTORY:  Uterine  cancer with vaginal radiation 2014, invasive mammary carcinoma Lt 2021 with lumpectomy and chemoradiation, anastrozole, CAD with stent 2018, emphysema, cirrhosis, type 2 diabetes, cholecystectomy, Rt TKA, Lt THA, total abdominal hysterectomy (from cancer)   BOWEL MOVEMENT: Pain with bowel movement: No - states that she hardly feels it Type of bowel movement:Type (Bristol Stool Scale) 5-7, Frequency many times a day, usually small amounts every time she urinates, and Strain no - just comes out Fully empty rectum: No Leakage: Yes: unaware, with passing gas Pads: Yes: 3 briefs on bad days (day and night) Fiber supplement/laxative Yes and Miralax  URINATION: Pain with urination: No Fully empty bladder: Yes:   Stream: Strong Urgency: Yes  Frequency: over 3 or 4 hours; no nocturia Leakage: Urge to void, Walking to the bathroom, and standing up Pads: Yes: 3 briefs a day/night  INTERCOURSE:  Nor sexually active and no history of pain  PREGNANCY: Vaginal deliveries 1 Tearing No Episiotomy No C-section deliveries 1 Currently pregnant No  PROLAPSE: None   OBJECTIVE:  Note: Objective measures were completed at Evaluation unless otherwise noted.  04/23/23: PATIENT SURVEYS:   PFIQ-7: 21  COGNITION: Overall cognitive status: Within functional limits for tasks assessed     SENSATION: Light touch: Appears intact  FUNCTIONAL TESTS:  Squat: bil valgus knee collapse Single leg stance:  Rt: compensated trendelenburg with UE support  Lt: pelvic drop with UE support Curl-up test: significant abdominal distortion    GAIT: Assistive device utilized: None Comments: Rt antalgic pattern, decreased bil hip extension  POSTURE: rounded shoulders, forward head, decreased lumbar lordosis, increased thoracic kyphosis, and posterior pelvic  tilt   LUMBARAROM/PROM:  A/PROM A/PROM  Eval (% available)  Flexion 75  Extension 50  Right lateral flexion 50  Left lateral flexion 50  Right rotation 50  Left rotation 50   (Blank rows = not tested)   PALPATION:   General: significant tightness in bil obliques and lumbar paraspinals   Abdominal: abdominal distention and tightness; scar tissue                  External Perineal Exam: dry                             Internal Pelvic Floor: vaginal stenosis, scar tissue restriction, decreased mobility; decreased rectal sensation; decreased ability to create appropriate intra-rectal pressure when attempting to push like during bowel movement   Patient confirms identification and approves PT to assess internal pelvic floor and treatment Yes  PELVIC MMT:   MMT eval  Vaginal No active contraction  Internal Anal Sphincter No active contraction  External Anal Sphincter 1/5  Puborectalis 1/5  Diastasis Recti 4 finger widths   (Blank rows = not tested)        TONE: low  PROLAPSE: None detected, but difficulty with coordination of appropriate bearing down   TODAY'S TREATMENT:  DATE:  04/23/23  EVAL  Neuromuscular re-education: Pt provides verbal consent for internal vaginal/rectal pelvic floor exam. Internal vaginal and rectal pelvic floor muscle contraction training Quick flicks   PATIENT EDUCATION:  Education details: See above Person educated: Patient Education method: Explanation, Demonstration, Tactile cues, Verbal cues, and Handouts Education comprehension: verbalized understanding  HOME EXERCISE PROGRAM: [id number]  ASSESSMENT:  CLINICAL IMPRESSION: Patient is a 74 y.o. female who was seen today for physical therapy evaluation and treatment for fecal and urinary incontinence. Exam findings notable for abnormal posture and gait, decreased  lumbar A/ROM, poor pelvic stability and hip strength demonstrated through single leg stance, abdominal weakness with significant distortion and 4 finger width diastasis recti, notable abdominal scar tissue restriction, inability to find active vaginal pelvic floor muscle contraction, no internal anal sphincter contraction, 1/5 external anal sphincter contraction with cuing, poor generation of intra-rectal pressure, decreased rectal sensation, and poor coordination of pelvic floor muscles. Signs and symptoms are most consistent with very poor pelvic floor muscle contraction with inability to contract vaginally and significant weakness in external anal sphincter/puborectalis; vaginal and abdominal scar tissue restriction and overall weakness is also likely contributing to dysfunction. Initial treatment consisted of pelvic floor muscle contraction training vaginally and rectally with breath coordination with some good improvements. She will continue to benefit from skilled PT intervention in order to improve fecal and urinary incontinence, begin/progress functional strengthening program, and improve quality of life.   OBJECTIVE IMPAIRMENTS: decreased activity tolerance, decreased coordination, decreased endurance, decreased mobility, decreased ROM, decreased strength, increased fascial restrictions, increased muscle spasms, impaired tone, postural dysfunction, and pain.   ACTIVITY LIMITATIONS: continence  PARTICIPATION LIMITATIONS: community activity  PERSONAL FACTORS: 3+ comorbidities: medical history  are also affecting patient's functional outcome.   REHAB POTENTIAL: Good  CLINICAL DECISION MAKING: Evolving/moderate complexity  EVALUATION COMPLEXITY: Moderate   GOALS: Goals reviewed with patient? Yes  SHORT TERM GOALS: Target date: 05/21/2023   Pt will be independent with HEP.   Baseline: Goal status: INITIAL  2.  Pt will report 25% improvement in fecal incontinence.  Baseline:  Goal  status: INITIAL  3.  Pt will be able to teach back and utilize urge suppression technique in order to help reduce number of trips to the bathroom.    Baseline:  Goal status: INITIAL  4.  Pt will decrease number of bowel movements to no more than 3 a day in order to decrease skin irritation and demonstrate more complete emptying.  Baseline:  Goal status: INITIAL  5.  Pt will be independent with use of squatty potty, relaxed toileting mechanics, and improved bowel movement techniques in order to increase ease of bowel movements and complete evacuation so she can decrease leaking.   Baseline:  Goal status: INITIAL   LONG TERM GOALS: Target date: 07/16/23  Pt will be independent with advanced HEP.   Baseline:  Goal status: INITIAL  2.  Pt will report 75% improvement in fecal incontinence.  Baseline:  Goal status: INITIAL  3.  Pt will be able to pass gas without fecal incontinence in order to decrease number of briefs she is using daily.  Baseline:  Goal status: INITIAL  4.  Pt will report no more than 1 brief a day in order to decrease amount she is having to buy and demonstrate improved bowel control.  Baseline:  Goal status: INITIAL  5.  Pt will demonstrate normal pelvic floor muscle tone and A/ROM, able to achieve 4/5 strength with contractions and 10 sec endurance,  in order to provide appropriate lumbopelvic support in functional activities.   Baseline:  Goal status: INITIAL  6.  Pt will be able to go 2-3 hours in between voids without urgency or incontinence in order to improve QOL and perform all functional activities with less difficulty.   Baseline:  Goal status: INITIAL  PLAN:  PT FREQUENCY: 1-2x/week  PT DURATION: 12 weeks  PLANNED INTERVENTIONS: 97110-Therapeutic exercises, 97530- Therapeutic activity, 97112- Neuromuscular re-education, 97535- Self Care, 16109- Manual therapy, Dry Needling, and Biofeedback  PLAN FOR NEXT SESSION: Return to internal rectal  exam to work on biofeedback of pelvic floor muscle contraction; biofeedback; hip/core exercises.    Julio Alm, PT, DPT04/09/254:09 PM

## 2023-04-24 ENCOUNTER — Other Ambulatory Visit: Payer: Self-pay

## 2023-04-25 ENCOUNTER — Telehealth: Payer: Self-pay

## 2023-04-25 ENCOUNTER — Other Ambulatory Visit: Payer: Self-pay

## 2023-04-25 ENCOUNTER — Other Ambulatory Visit (HOSPITAL_COMMUNITY): Payer: Self-pay

## 2023-04-25 ENCOUNTER — Other Ambulatory Visit: Payer: Self-pay | Admitting: Physician Assistant

## 2023-04-25 MED ORDER — INSUPEN PEN NEEDLES 32G X 4 MM MISC
3 refills | Status: AC
  Filled 2023-04-25: qty 100, 100d supply, fill #0
  Filled 2023-04-30: qty 100, 30d supply, fill #0
  Filled 2023-07-30: qty 100, 30d supply, fill #1
  Filled 2023-10-31: qty 100, 30d supply, fill #2
  Filled 2024-01-11: qty 100, 30d supply, fill #3

## 2023-04-25 NOTE — Telephone Encounter (Signed)
 Copied from CRM [id number]. Topic: Clinical - Medical Advice >> Apr 25, 2023  2:05 PM Sonny Dandy B wrote: Reason for CRM Pt called for an urgent request for Insulin Pen Needle (PEN NEEDLES) 32G X 4 MM MISC prescription to be sent to her pharmacy. Pt states she is currently out of needles,  This refill request was sent to office from pharmacy at 2:00 pm and has been taken care of; duplicate message. Nothing further needed at this time.

## 2023-04-25 NOTE — Telephone Encounter (Signed)
 Copied from CRM [id number]. Topic: Clinical - Medication Refill >> Apr 25, 2023  1:50 PM Sonny Dandy B wrote: Most Recent Primary Care Visit:  Provider: Bary Leriche  Department: [facility]  Visit Type: OFFICE VISIT  Date: 04/01/2023  Medication: Insulin Pen Needle (PEN NEEDLES) 32G X 4 MM MISC   Has the patient contacted their pharmacy? Yes (Agent: If no, request that the patient contact the pharmacy for the refill. If patient does not wish to contact the pharmacy document the reason why and proceed with request.) (Agent: If yes, when and what did the pharmacy advise?)  Is this the correct pharmacy for this prescription? Yes If no, delete pharmacy and type the correct one.  This is the patient's preferred pharmacy:  [facility] [address] [place] [address] Phone: [phone] Fax: [phone]   Has the prescription been filled recently? Yes  Is the patient out of the medication? Yes  Has the patient been seen for an appointment in the last year OR does the patient have an upcoming appointment? Yes  Can we respond through MyChart? Yes  Agent: Please be advised that Rx refills may take up to 3 business days. We ask that you follow-up with your pharmacy.   Rx refill sent nothing further needed at this time.

## 2023-04-25 NOTE — Patient Instructions (Signed)
 Visit Information  Thank you for taking time to visit with me today. Please don't hesitate to contact me if I can be of assistance to you before our next scheduled telephone appointment.  Our next appointment is by telephone on 5/27 at 11am  Following is a copy of your care plan:   Goals Addressed             This Visit's Progress    VBCI RN Care Plan       Problems:  Chronic Disease Management support and education needs related to DMII  Goal: Over the next 3 months the Patient will continue to work with RN Care Manager and/or Social Worker to address care management and care coordination needs related to DMII as evidenced by adherence to CM Team Scheduled appointments      Interventions:   Diabetes Interventions: Assessed patient's understanding of A1c goal: <7% Provided education to patient about basic DM disease process Reviewed medications with patient and discussed importance of medication adherence Counseled on importance of regular laboratory monitoring as prescribed Discussed plans with patient for ongoing care management follow up and provided patient with direct contact information for care management team Advised patient, providing education and rationale, to check cbg daily and record, calling PCP for findings outside established parameters Lab Results  Component Value Date   HGBA1C 9.2 (A) 03/18/2023    Patient Self-Care Activities:  Attend all scheduled provider appointments Call pharmacy for medication refills 3-7 days in advance of running out of medications Call provider office for new concerns or questions  Perform all self care activities independently  Perform IADL's (shopping, preparing meals, housekeeping, managing finances) independently Take medications as prescribed   check blood sugar at prescribed times: fasting AM blood sugar check feet daily for cuts, sores or redness enter blood sugar readings and medication or insulin into daily log take  the blood sugar log to all doctor visits drink 6 to 8 glasses of water each day eat fish at least once per week fill half of plate with vegetables  Plan:  The patient has been provided with contact information for the care management team and has been advised to call with any health related questions or concerns.              Patient verbalizes understanding of instructions and care plan provided today and agrees to view in MyChart. Active MyChart status and patient understanding of how to access instructions and care plan via MyChart confirmed with patient.     The patient has been provided with contact information for the care management team and has been advised to call with any health related questions or concerns.   Please call the care guide team at [phone] if you need to cancel or reschedule your appointment.   Please call 1-800-273-TALK (toll free, 24 hour hotline) if you are experiencing a Mental Health or Behavioral Health Crisis or need someone to talk to.  Shayann Garbutt A. Mliss Fritz RN, BA, [facility], CRRN [place]  [facility] Population Health RN Care Manager Direct Dial: [phone]  Fax: [phone]

## 2023-04-25 NOTE — Patient Outreach (Signed)
 Complex Care Management   Visit Note  04/25/2023  Name:  Felicia [name] MRN: [id number] DOB: [birth year]  Situation: Referral received for Complex Care Management related to Diabetes with Complications I obtained verbal consent from patient, Felicia [name].  Visit completed with RNCM, Alyse Low  on the phone  Background:   Past Medical History:  Diagnosis Date   Adenomatous colon polyp 2015   Anemia    Anxiety    Breast cancer (HCC) 09/2019   left breast IMC   CAD (coronary artery disease)    a. 3/2007s/p DES to the [facility]); b. 01/2017 MV: EF 80%, small, mild apical ant defect w/o ischemia (felt to be breast atten). Low risk.   Depression    DM (diabetes mellitus) (HCC)    Emphysema lung (HCC)    Essential hypertension    GERD (gastroesophageal reflux disease)    Headache    secondary to neck surgery per patient   High triglycerides    HTN (hypertension)    Hyperlipidemia    Hypothyroid    OA (osteoarthritis)    right knee,hands   Overflow incontinence    PONV (postoperative nausea and vomiting)    s/p gallbladder    RLS (restless legs syndrome)    Urinary urgency    Uterine cancer (HCC) dx'd 2014    Assessment: Patient Reported Symptoms:  Cognitive Alert and oriented to person, place, and time  Neurological No symptoms reported    HEENT No symptoms reported    Cardiovascular No symptoms reported    Respiratory No symptoms reported    Endocrine Other AM fasting blood sugars are not quite at PCP's goal of less than or equal to 140, patient has been instructed to increase her Lantus by 2 units every 2 days until goal of < or = to 140, but patient monitors her blood sugars weekly and every Tuesday examines the trends and increases her Lantus dose by 2units based on a week-long trend than just 2 days of data. Last PCP visit (3/18), Lantus was increased to 48 units nightly and patient has subsequently increased nightly dose to 50 units nightly because she  still has not met the goal of < or = to 140.  Gastrointestinal Diarrhea    Genitourinary No symptoms reported    Integumentary No symptoms reported    Musculoskeletal  Suffers from severe osteoarthrits    Psychosocial No symptoms reported     There were no vitals filed for this visit.  Medications Reviewed Today     Reviewed by Marcos Eke, RN (Registered Nurse) on 04/24/23 at 1050  Med List Status: <None>   Medication Order Taking? Sig Documenting Provider Last Dose Status Informant  ACCU-CHEK GUIDE test strip [id number] No USE TO check blood glucose UP TO four times daily AS DIRECTED Allwardt, Alyssa M, PA-C Taking Active   Accu-Chek Softclix Lancets lancets [id number] No USE TO check blood glucose UP TO four times daily AS DIRECTED Allwardt, Alyssa M, PA-C Taking Active   albuterol (VENTOLIN HFA) 108 (90 Base) MCG/ACT inhaler [id number] No Inhale 2 puffs into the lungs every 6 (six) hours as needed for wheezing or shortness of breath. Luciano Cutter, MD Taking Active   anastrozole (ARIMIDEX) 1 MG tablet [id number] No Take 1 tablet (1 mg total) by mouth daily. Serena Croissant, MD Taking Active   Ascorbic Acid (VITAMIN C PO) [id number] No Take 1 tablet by mouth daily. [provider] Taking Active Self  aspirin EC (ASPIRIN 81) 81  MG tablet [id number] No Take 1 tablet (81 mg total) by mouth in the morning. Allwardt, Crist Infante, PA-C Taking Active   azelastine (ASTELIN) 0.1 % nasal spray [id number] No Place 2 sprays into both nostrils 2 (two) times daily. Use in each nostril as directed Allwardt, Alyssa M, PA-C Taking Active   bisoprolol (ZEBETA) 5 MG tablet [id number] No Take 1 tablet (5 mg total) by mouth at bedtime. Nahser, Deloris Ping, MD Taking Active   blood glucose meter kit and supplies [id number] No Dispense based on patient and insurance preference. Use up to four times daily as directed. (FOR ICD-10 E10.9, E11.9). Allwardt, Crist Infante, PA-C Taking Active   buPROPion (WELLBUTRIN  XL) 150 MG 24 hr tablet [id number] No Take 1 tablet (150 mg total) by mouth every morning for depression. Allwardt, Crist Infante, PA-C Taking Active   celecoxib (CELEBREX) 200 MG capsule [id number] No Take 1 capsule (200 mg total) by mouth 2 (two) times daily. Allwardt, Milus Mallick Taking Active   CINNAMON PO [id number] No Take 1 capsule by mouth daily with supper. [provider] Taking Active Self  diclofenac sodium (VOLTAREN) 1 % GEL [id number] No Apply 2-4 g topically 4 (four) times daily as needed (as directed for pain). [provider] Taking Active Self  empagliflozin (JARDIANCE) 25 MG TABS tablet [id number] No Take 1 tablet (25 mg total) by mouth daily. Allwardt, Crist Infante, PA-C Taking Active   escitalopram (LEXAPRO) 20 MG tablet [id number] No Take 1 tablet (20 mg total) by mouth daily. Allwardt, Crist Infante, PA-C Taking Active   fenofibrate (TRICOR) 145 MG tablet [id number] No Take 1 tablet (145 mg total) by mouth at bedtime. Nahser, Deloris Ping, MD Taking Active   Ferrous Sulfate (IRON PO) [id number] No Take 1 tablet by mouth daily. alternates days:1 tablet one day and 2 tablets the next day [provider] Taking Active Self  gabapentin (NEURONTIN) 600 MG tablet [id number] No Take 1 tablet (600 mg total) by mouth 3 (three) times daily. Drema Dallas, DO Taking Active   glipiZIDE (GLUCOTROL XL) 10 MG 24 hr tablet [id number] No Take 2 tablets (20 mg total) by mouth every morning. Allwardt, Crist Infante, PA-C Taking Active   glucose blood test strip [id number] No Use to check blood sugar up to four times daily as directed Allwardt, Alyssa M, PA-C Taking Active   HYDROcodone-acetaminophen (NORCO/VICODIN) 5-325 MG tablet [id number] No Take 1 tablet by mouth every 6 (six) hours as needed. Rodolph Bong, MD Taking Active   Incontinence Supply Disposable (DEPEND ADJUSTABLE UNDERWEAR) MISC [id number] No 1 Application by Does not apply route daily. Allwardt, Crist Infante, PA-C Taking Active   insulin  glargine (LANTUS SOLOSTAR) 100 UNIT/ML Solostar Pen [id number] No Start with injecting 4 units under the skin in the evenings. Increase by 2 units every 2 days until monring fasting glucose is to goal (140). Max units 60 per day. Allwardt, Crist Infante, PA-C Taking Active   Insulin Pen Needle (PEN NEEDLES) 32G X 4 MM MISC [id number] No USE TO INJECT INSULIN Allwardt, Crist Infante, PA-C Taking Active   Lancets MISC [id number] No Use to check blood sugar up to four times daily as directed Allwardt, Crist Infante, PA-C Taking Active   levothyroxine (SYNTHROID) 125 MCG tablet [id number] No Take 1 tablet (125 mcg total) by mouth daily before breakfast. Allwardt, Alyssa M, PA-C Taking Active   loratadine (ALLERGY RELIEF) 10 MG tablet [id number] No Take 1 tablet (10 mg total) by mouth every evening. Allwardt,  Crist Infante, PA-C Taking Active   metroNIDAZOLE (METROGEL) 1 % gel [id number] No Apply 1 application topically as needed (as directed to affected area). Loa Socks, NP Taking Active Self  Multiple Vitamins-Calcium (ONE-A-DAY WOMENS PO) [id number] No Take 1 tablet by mouth daily. [provider] Taking Active Self  nitroGLYCERIN (NITROSTAT) 0.4 MG SL tablet [id number] No Dissolve 1 tablet under the tongue every 5 minutes as needed for chest pain. Max of 3 doses, then 911. Nahser, Deloris Ping, MD Taking Active   omeprazole (PRILOSEC) 40 MG capsule [id number] No Take 1 capsule (40 mg total) by mouth every evening. Allwardt, Crist Infante, PA-C Taking Active   omeprazole (PRILOSEC) 40 MG capsule [id number] No Take 1 capsule (40 mg total) by mouth every evening. Allwardt, Crist Infante, PA-C Taking Active   polyethylene glycol powder (MIRALAX) 17 GM/SCOOP powder [id number] No Take 17 g by mouth daily. Doree Albee, PA-C Taking Active   rOPINIRole (REQUIP) 0.5 MG tablet [id number] No Take 1 tablet (0.5 mg total) by mouth at bedtime. Allwardt, Crist Infante, PA-C Taking Active   Tiotropium Bromide-Olodaterol (STIOLTO RESPIMAT)  2.5-2.5 MCG/ACT AERS [id number]  Inhale 2 puffs into the lungs daily. Luciano Cutter, MD  Active   tiZANidine (ZANAFLEX) 2 MG tablet [id number] No Take 1 tablet (2 mg total) by mouth 3 (three) times daily. Drema Dallas, DO Taking Active   traMADol (ULTRAM) 50 MG tablet [id number] No Take 1 tablet (50 mg total) by mouth every 6 (six) hours as needed for moderate pain (pain score 4-6) or severe pain (pain score 7-10). Allwardt, Crist Infante, PA-C Taking Active   triamcinolone (KENALOG) 0.1 % paste [id number] No Apply a small amount to affected area twice daily for 2 - 3 weeks.  Taking Active   TURMERIC PO [id number] No Take 1 capsule by mouth daily with supper. [provider] Taking Active Self  Wheat Dextrin (BENEFIBER) POWD [id number] No 1-2 TBSP work up to 3 x a day, mix in water or liquid Doree Albee, [place] Taking Active             Recommendation:   Specialty provider follow-up Dr. Zoila Shutter, [facility] on 06/06/23  - Hyperlipidemia LDL goal < 70 Continue following PCP, [name]'s treatment plan re Lantus dose adjustments to reach goal of fasting blood sugar < or = to 140. Patient presently @ dose of 50 units Lantus nightly  Follow Up Plan:   Telephone follow up appointment date/time:  5/27 at [facility] A. Mliss Fritz RN, BA, [facility], CRRN [place]  [facility] Population Health RN Care Manager Direct Dial: [phone]  Fax: [phone]

## 2023-04-28 ENCOUNTER — Encounter (HOSPITAL_COMMUNITY): Payer: Self-pay

## 2023-04-29 ENCOUNTER — Other Ambulatory Visit (HOSPITAL_COMMUNITY): Payer: Self-pay

## 2023-04-29 DIAGNOSIS — M79641 Pain in right hand: Secondary | ICD-10-CM | POA: Diagnosis not present

## 2023-04-30 ENCOUNTER — Other Ambulatory Visit (HOSPITAL_COMMUNITY): Payer: Self-pay

## 2023-05-01 ENCOUNTER — Other Ambulatory Visit: Payer: Self-pay

## 2023-05-05 ENCOUNTER — Other Ambulatory Visit: Payer: Self-pay

## 2023-05-05 ENCOUNTER — Encounter: Payer: Self-pay | Admitting: Oncology

## 2023-05-05 ENCOUNTER — Other Ambulatory Visit: Payer: Self-pay | Admitting: Physician Assistant

## 2023-05-05 DIAGNOSIS — G8929 Other chronic pain: Secondary | ICD-10-CM

## 2023-05-06 ENCOUNTER — Other Ambulatory Visit (HOSPITAL_COMMUNITY): Payer: Self-pay

## 2023-05-06 MED ORDER — TRAMADOL HCL 50 MG PO TABS
50.0000 mg | ORAL_TABLET | Freq: Four times a day (QID) | ORAL | 0 refills | Status: DC | PRN
  Filled 2023-05-06: qty 30, 8d supply, fill #0

## 2023-05-06 NOTE — Telephone Encounter (Signed)
 Last OV: 04/01/23  Next OV: 07/08/23  Last Filled: 04/01/23  Quantity: 30

## 2023-05-15 ENCOUNTER — Other Ambulatory Visit: Payer: Self-pay

## 2023-05-15 ENCOUNTER — Encounter: Payer: Self-pay | Admitting: Oncology

## 2023-05-15 ENCOUNTER — Other Ambulatory Visit (HOSPITAL_COMMUNITY): Payer: Self-pay

## 2023-05-20 ENCOUNTER — Encounter: Payer: Self-pay | Admitting: Oncology

## 2023-05-20 ENCOUNTER — Other Ambulatory Visit: Payer: Self-pay

## 2023-05-20 ENCOUNTER — Other Ambulatory Visit (HOSPITAL_COMMUNITY): Payer: Self-pay

## 2023-05-20 ENCOUNTER — Other Ambulatory Visit: Payer: Self-pay | Admitting: Physician Assistant

## 2023-05-20 MED ORDER — POLYETHYLENE GLYCOL 3350 17 GM/SCOOP PO POWD
17.0000 g | Freq: Every day | ORAL | 11 refills | Status: AC
  Filled 2023-05-20: qty 476, 28d supply, fill #0
  Filled 2023-11-02: qty 476, 28d supply, fill #1

## 2023-05-21 ENCOUNTER — Other Ambulatory Visit: Payer: Self-pay

## 2023-05-26 ENCOUNTER — Ambulatory Visit (INDEPENDENT_AMBULATORY_CARE_PROVIDER_SITE_OTHER)

## 2023-05-26 ENCOUNTER — Other Ambulatory Visit (HOSPITAL_COMMUNITY): Payer: Self-pay

## 2023-05-26 ENCOUNTER — Other Ambulatory Visit: Payer: Self-pay

## 2023-05-26 ENCOUNTER — Telehealth: Payer: Self-pay | Admitting: Family Medicine

## 2023-05-26 ENCOUNTER — Ambulatory Visit (INDEPENDENT_AMBULATORY_CARE_PROVIDER_SITE_OTHER): Admitting: Family Medicine

## 2023-05-26 ENCOUNTER — Encounter: Payer: Self-pay | Admitting: Family Medicine

## 2023-05-26 VITALS — BP 120/72 | HR 44 | Ht 64.0 in | Wt 186.0 lb

## 2023-05-26 DIAGNOSIS — M25562 Pain in left knee: Secondary | ICD-10-CM

## 2023-05-26 DIAGNOSIS — M5416 Radiculopathy, lumbar region: Secondary | ICD-10-CM | POA: Diagnosis not present

## 2023-05-26 DIAGNOSIS — G8929 Other chronic pain: Secondary | ICD-10-CM | POA: Diagnosis not present

## 2023-05-26 DIAGNOSIS — M25551 Pain in right hip: Secondary | ICD-10-CM

## 2023-05-26 DIAGNOSIS — M1712 Unilateral primary osteoarthritis, left knee: Secondary | ICD-10-CM | POA: Diagnosis not present

## 2023-05-26 DIAGNOSIS — G2581 Restless legs syndrome: Secondary | ICD-10-CM | POA: Diagnosis not present

## 2023-05-26 DIAGNOSIS — Z96642 Presence of left artificial hip joint: Secondary | ICD-10-CM | POA: Diagnosis not present

## 2023-05-26 DIAGNOSIS — M1611 Unilateral primary osteoarthritis, right hip: Secondary | ICD-10-CM | POA: Diagnosis not present

## 2023-05-26 MED ORDER — ROPINIROLE HCL 1 MG PO TABS
1.0000 mg | ORAL_TABLET | Freq: Every day | ORAL | 0 refills | Status: DC
  Filled 2023-05-26: qty 90, 90d supply, fill #0

## 2023-05-26 MED ORDER — HYDROCODONE-ACETAMINOPHEN 5-325 MG PO TABS
1.0000 | ORAL_TABLET | Freq: Four times a day (QID) | ORAL | 0 refills | Status: DC | PRN
  Filled 2023-05-26: qty 15, 4d supply, fill #0

## 2023-05-26 NOTE — Patient Instructions (Addendum)
 Thank you for coming in today.   Please get an Xray today before you leave   We've placed an order for your back injection.   See you back as needed.

## 2023-05-26 NOTE — Telephone Encounter (Signed)
 Felicia [name] wanted to increase her Requip  dose due to ineffectiveness for restless leg syndrome.  We forgot to talk about it at the visit.  I sent a new prescription to [facility] pharmacy 1 mg at bedtime.  Please inform patient.

## 2023-05-26 NOTE — Progress Notes (Signed)
 I, Miquel Amen, CMA acting as a scribe for Garlan Juniper, MD.  Felicia [name] is a 73 y.o. female who presents to [facility] today for L leg pain. Pt was last seen by Dr. Alease Hunter on 02/24/23 and completed the Gelsyn series, 3/3, in her L knee.  Today, pt reports worsening pain in the right hip and leg. Pain causing difficulty with WB and ambulation, the leg gave out of her last Monday. Feels like the tibia is going to snap in half. Pain has been up to 9/10 at times. Notes limited ROM in the leg, having to use the sock helper again. Also suffers from RLS, these sx feel different, notes that RLS has been getting worse and not responding as well to supplements and Ropinirole . Also notes dropping scissors and injuring the left foot. Notes improvement of left knee pain with Gelsyn series.   Additionally she wants to increase her Requip  for restless leg syndrome.  She is taking 0.5 mg at bedtime and finds it to be less effective.  She is interested in increasing her dose.  Radiates: hip to foot, R Aggravates: WB, ambulation, ROM Treatments tried: Hydrocodone   Pertinent review of systems: Fevers or chills  Relevant historical information: Emphysema.  Diabetes.  Not quite well-controlled. Lumbar radiculopathy.   Exam:  BP 120/72   Pulse (!) 44   Ht 5\' 4"  (1.626 m)   Wt 186 lb (84.4 kg)   SpO2 91%   BMI 31.93 kg/m  General: Well Developed, well nourished, and in no acute distress.   MSK: L-spine: Normal appearing Nontender palpation midline. Lower extremity strength is intact except right hip abduction which is reduced. Mildly positive right-sided slump test. Reflexes are intact.  Right hip: Normal appearing Tender palpation lateral hip. Reduced hip abduction strength. Range of motion intact.   Lab and Radiology Results  X-ray images right hip obtained today personally and independently interpreted. Mild enthesiopathy changes at greater trochanter.   Minimal degenerative changes.  No acute fractures. Intact normal-appearing left total hip replacement. Await formal radiology review.     Assessment and Plan: 73 y.o. female with right leg pain due primarily to lumbar radiculopathy at right L5.  Plan for repeat epidural steroid injection.  Previous injection was about a year ago.  Some of the pain could also be due to greater trochanteric bursitis.  We talked about doing an injection today but her diabetes is not very well-controlled.  Priority should be epidural steroid injection.  She will adjust her insulin  based on hyperglycemia.  Additionally restless leg syndrome is not well-controlled.  Plan increase Requip  from 0.5 mg at bedtime to 1 mg at bedtime.  Hydrocodone  refilled for pain control. PDMP reviewed during this encounter. Orders Placed This Encounter  Procedures   US  LIMITED JOINT SPACE STRUCTURES LOW LEFT(NO LINKED CHARGES)    Reason for Exam (SYMPTOM  OR DIAGNOSIS REQUIRED):   L LE pain    Preferred imaging location?:   [facility]   DG HIP UNILAT W OR W/O PELVIS 2-3 VIEWS RIGHT    Standing Status:   Future    Number of Occurrences:   1    Expiration Date:   06/26/2023    Reason for Exam (SYMPTOM  OR DIAGNOSIS REQUIRED):   right hip pain    Preferred imaging location?:   [facility]   DG INJECT DIAG/THERA/INC NEEDLE/CATH/PLC EPI/LUMB/SAC W/IMG    Level and technique per radiology    Standing  Status:   Future    Expiration Date:   06/26/2023    Reason for Exam (SYMPTOM  OR DIAGNOSIS REQUIRED):   Low back pain    Preferred Imaging Location?:   [facility]   Meds ordered this encounter  Medications   HYDROcodone -acetaminophen  (NORCO/VICODIN) 5-325 MG tablet    Sig: Take 1 tablet by mouth every 6 (six) hours as needed.    Dispense:  15 tablet    Refill:  0   rOPINIRole  (REQUIP ) 1 MG tablet    Sig: Take 1 tablet (1 mg total) by mouth at bedtime.    Dispense:  90 tablet     Refill:  0     Discussed warning signs or symptoms. Please see discharge instructions. Patient expresses understanding.   The above documentation has been reviewed and is accurate and complete Garlan Juniper, M.D.

## 2023-05-27 ENCOUNTER — Other Ambulatory Visit (HOSPITAL_COMMUNITY): Payer: Self-pay

## 2023-05-27 ENCOUNTER — Other Ambulatory Visit: Payer: Self-pay

## 2023-05-27 NOTE — Telephone Encounter (Signed)
Called pt and advised. Pt verbalized understanding.  

## 2023-05-30 ENCOUNTER — Encounter: Payer: Self-pay | Admitting: Physician Assistant

## 2023-05-30 ENCOUNTER — Other Ambulatory Visit: Payer: Self-pay | Admitting: Physician Assistant

## 2023-05-30 ENCOUNTER — Encounter: Payer: Self-pay | Admitting: Family Medicine

## 2023-05-30 DIAGNOSIS — G8929 Other chronic pain: Secondary | ICD-10-CM

## 2023-05-30 NOTE — Telephone Encounter (Signed)
 Please see pt msg as FYI on recent fall

## 2023-06-01 ENCOUNTER — Ambulatory Visit: Payer: Self-pay | Admitting: Family Medicine

## 2023-06-01 NOTE — Progress Notes (Signed)
 Right hip x-ray shows a little bit of arthritis in the hip joint on the right side.  The left hip replacement looks okay to radiology.

## 2023-06-02 NOTE — Discharge Instructions (Signed)
 Post Procedure Spinal Discharge Instruction Sheet  You may resume a regular diet and any medications that you routinely take (including pain medications) unless otherwise noted by MD.  No driving day of procedure.  Light activity throughout the rest of the day.  Do not do any strenuous work, exercise, bending or lifting.  The day following the procedure, you can resume normal physical activity but you should refrain from exercising or physical therapy for at least three days thereafter.  You may apply ice to the injection site, 20 minutes on, 20 minutes off, as needed. Do not apply ice directly to skin.    Common Side Effects:  Headaches- take your usual medications as directed by your physician.  Increase your fluid intake.  Caffeinated beverages may be helpful.  Lie flat in bed until your headache resolves.  Restlessness or inability to sleep- you may have trouble sleeping for the next few days.  Ask your referring physician if you need any medication for sleep.  Facial flushing or redness- should subside within a few days.  Increased pain- a temporary increase in pain a day or two following your procedure is not unusual.  Take your pain medication as prescribed by your referring physician.  Leg cramps  Please contact our office at [phone] for the following symptoms: Fever greater than 100 degrees. Headaches unresolved with medication after 2-3 days. Increased swelling, pain, or redness at injection site.   YOU MAY RESUME TAKING YOUR ASPIRIN TODAY.  Thank you for visiting [facility] today.

## 2023-06-02 NOTE — Telephone Encounter (Signed)
 Last OV: 04/01/23  Next OV: 07/08/23  Last Filled: 05/06/23  Quantity: 30

## 2023-06-03 ENCOUNTER — Other Ambulatory Visit: Payer: Self-pay

## 2023-06-03 ENCOUNTER — Ambulatory Visit
Admission: RE | Admit: 2023-06-03 | Discharge: 2023-06-03 | Disposition: A | Discharge status: Home / Self Care | Source: Ambulatory Visit | Attending: Family Medicine | Admitting: Family Medicine

## 2023-06-03 DIAGNOSIS — M4727 Other spondylosis with radiculopathy, lumbosacral region: Secondary | ICD-10-CM | POA: Diagnosis not present

## 2023-06-03 DIAGNOSIS — M5416 Radiculopathy, lumbar region: Secondary | ICD-10-CM

## 2023-06-03 MED ORDER — METHYLPREDNISOLONE ACETATE 40 MG/ML INJ SUSP (RADIOLOG
80.0000 mg | Freq: Once | INTRAMUSCULAR | Status: AC
  Administered 2023-06-03: 80 mg via EPIDURAL

## 2023-06-03 MED ORDER — IOPAMIDOL (ISOVUE-M 200) INJECTION 41%
1.0000 mL | Freq: Once | INTRAMUSCULAR | Status: AC
  Administered 2023-06-03: 1 mL via EPIDURAL

## 2023-06-03 MED ORDER — TRAMADOL HCL 50 MG PO TABS
50.0000 mg | ORAL_TABLET | Freq: Four times a day (QID) | ORAL | 0 refills | Status: DC | PRN
  Filled 2023-06-03: qty 30, 8d supply, fill #0

## 2023-06-04 ENCOUNTER — Other Ambulatory Visit (HOSPITAL_COMMUNITY): Payer: Self-pay

## 2023-06-04 NOTE — Telephone Encounter (Signed)
 Forwarding to Dr. Denyse Amass as Lorain Childes.

## 2023-06-06 ENCOUNTER — Ambulatory Visit (HOSPITAL_BASED_OUTPATIENT_CLINIC_OR_DEPARTMENT_OTHER): Payer: 59 | Admitting: Internal Medicine

## 2023-06-06 VITALS — BP 118/58 | HR 74 | Ht 64.0 in | Wt 183.0 lb

## 2023-06-06 DIAGNOSIS — E781 Pure hyperglyceridemia: Secondary | ICD-10-CM

## 2023-06-06 DIAGNOSIS — I2510 Atherosclerotic heart disease of native coronary artery without angina pectoris: Secondary | ICD-10-CM

## 2023-06-06 DIAGNOSIS — E785 Hyperlipidemia, unspecified: Secondary | ICD-10-CM | POA: Diagnosis not present

## 2023-06-06 NOTE — Patient Instructions (Signed)
 Medication Instructions:  NO CHANGES today -- will depend on lab results.   *If you need a refill on your cardiac medications before your next appointment, please call your pharmacy*  Lab Work: FASTING lab work to check cholesterol  NMR lipoprofile and LPA  If you have labs (blood work) drawn today and your tests are completely normal, you will receive your results only by: MyChart Message (if you have MyChart) OR A paper copy in the mail If you have any lab test that is abnormal or we need to change your treatment, we will call you to review the results.  Follow-Up: At [facility], you and your health needs are our priority.  As part of our continuing mission to provide you with exceptional heart care, our providers are all part of one team.  This team includes your primary Cardiologist (physician) and Advanced Practice Providers or APPs (Physician Assistants and Nurse Practitioners) who all work together to provide you with the care you need, when you need it.  Your next appointment:    PENDING lab results/recommendations from Dr. Maximo Spar   We recommend signing up for the patient portal called "MyChart".  Sign up information is provided on this After Visit Summary.  MyChart is used to connect with patients for Virtual Visits (Telemedicine).  Patients are able to view lab/test results, encounter notes, upcoming appointments, etc.  Non-urgent messages can be sent to your provider as well.   To learn more about what you can do with MyChart, go to [email or web address].

## 2023-06-06 NOTE — Progress Notes (Signed)
 LIPID CLINIC CONSULT NOTE  Chief Complaint:  Manage dyslipidemia  Primary Care Physician: Allwardt, Deleta Felix, PA-C  Primary Cardiologist:  Ahmad Alert, MD  HPI:  Felicia [name] is a 73 y.o. female who is being seen today for the evaluation of dyslipidemia at the request of Nahser, Lela Purple, MD. this is a pleasant 73 year old female followed by Dr. Alroy Aspen with a history of coronary artery disease and prior drug-eluting stent to the LAD in 2007.  She also has hypertension, dyslipidemia, type 2 diabetes and other medical problems.  She was referred for evaluation management of her cholesterol.  She was seen last summer and noted to be on simvastatin but her LDL was not at target, namely at 108 at the time.  Her goal LDL is less than 70.  He advised switching to rosuvastatin  20 mg daily and stopping her simvastatin.  The simvastatin was stopped however is not clear that she restarted any statin medication.  She did have repeat lipids a few months later which showed total cholesterol 281, triglycerides 389, HDL 46 and LDL 161.  It does not appear she is on any lipid-lowering therapies other than fenofibrate .  She does have a history of high triglycerides specifically over thousand in the past.  PMHx:  Past Medical History:  Diagnosis Date   Adenomatous colon polyp 2015   Anemia    Anxiety    Breast cancer (HCC) 09/2019   left breast IMC   CAD (coronary artery disease)    a. 3/2007s/p DES to the LAD ([place] ); b. 01/2017 MV: EF 80%, small, mild apical ant defect w/o ischemia (felt to be breast atten). Low risk.   Depression    DM (diabetes mellitus) (HCC)    Emphysema lung (HCC)    Essential hypertension    GERD (gastroesophageal reflux disease)    Headache    secondary to neck surgery per patient   High triglycerides    HTN (hypertension)    Hyperlipidemia    Hypothyroid    OA (osteoarthritis)    right knee,hands   Overflow incontinence    PONV (postoperative nausea and  vomiting)    s/p gallbladder    RLS (restless legs syndrome)    Urinary urgency    Uterine cancer (HCC) dx'd 2014    Past Surgical History:  Procedure Laterality Date   BREAST LUMPECTOMY WITH RADIOACTIVE SEED AND SENTINEL LYMPH NODE BIOPSY Left 10/07/2019   Procedure: LEFT BREAST LUMPECTOMY WITH RADIOACTIVE SEED AND SENTINEL LYMPH NODE BIOPSY;  Surgeon: Enid Harry, MD;  Location: [facility];  Service: General;  Laterality: Left;   CHOLECYSTECTOMY     CORONARY STENT PLACEMENT     CORONARY/GRAFT ACUTE MI REVASCULARIZATION     FINGER ARTHRODESIS Right 06/11/2019   Procedure: ARTHRODESIS INDEX FINGER DISTAL PHALANGEAL JOINT;  Surgeon: Brunilda Capra, MD;  Location: [facility];  Service: Orthopedics;  Laterality: Right;   FOOT SURGERY Left    LEFT HEART CATH AND CORONARY ANGIOGRAPHY N/A 02/16/2018   Procedure: LEFT HEART CATH AND CORONARY ANGIOGRAPHY;  Surgeon: Avanell Leigh, MD;  Location: [facility];  Service: Cardiovascular;  Laterality: N/A;   LEFT HEART CATH AND CORONARY ANGIOGRAPHY N/A 05/01/2020   Procedure: LEFT HEART CATH AND CORONARY ANGIOGRAPHY;  Surgeon: Avanell Leigh, MD;  Location: [facility];  Service: Cardiovascular;  Laterality: N/A;   PORTACATH PLACEMENT Right 11/23/2019   Procedure: INSERTION PORT-A-CATH WITH ULTRASOUND GUIDANCE;  Surgeon: Enid Harry, MD;  Location:  [facility];  Service: General;  Laterality: Right;   REPLACEMENT TOTAL KNEE Right    SPINE SURGERY     TOTAL ABDOMINAL HYSTERECTOMY     FOR UTERUS CANCER   TOTAL HIP ARTHROPLASTY Left 2015    FAMHx:  Family History  Problem Relation Age of Onset   AAA (abdominal aortic aneurysm) Mother    Thyroid  disease Mother    Colon cancer Mother    Diabetes Father    Alzheimer's disease Father    Arthritis Sister    Heart disease Sister    Throat cancer Sister    Kidney failure Sister    Diabetes Sister    Diabetes Brother     Diabetes Brother    Breast cancer Cousin     SOCHx:   reports that she has never smoked. She has never used smokeless tobacco. She reports current alcohol use. She reports that she does not use drugs.  ALLERGIES:  Allergies  Allergen Reactions   Chloraprep One Step [Chlorhexidine  Gluconate] Rash   Estrogens Other (See Comments)    PATIENT HAS A HISTORY OF CANCER AND HAS BEEN TOLD TO NEVER TAKE ANYTHING CONTAINING ESTROGEN, AS IT MIGHT CAUSE A RECURRENCE   Shellfish-Derived Products Nausea And Vomiting   Shrimp [Shellfish Allergy] Nausea And Vomiting    ROS: Pertinent items noted in HPI and remainder of comprehensive ROS otherwise negative.  HOME MEDS: Current Outpatient Medications on File Prior to Visit  Medication Sig Dispense Refill   ACCU-CHEK GUIDE test strip USE TO check blood glucose UP TO four times daily AS DIRECTED 100 strip 0   Accu-Chek Softclix Lancets lancets USE TO check blood glucose UP TO four times daily AS DIRECTED 100 each 0   albuterol  (VENTOLIN  HFA) 108 (90 Base) MCG/ACT inhaler Inhale 2 puffs into the lungs every 6 (six) hours as needed for wheezing or shortness of breath. 6.7 g 2   anastrozole  (ARIMIDEX ) 1 MG tablet Take 1 tablet (1 mg total) by mouth daily. 90 tablet 4   Ascorbic Acid (VITAMIN C PO) Take 1 tablet by mouth daily.     aspirin  EC (ASPIRIN  81) 81 MG tablet Take 1 tablet (81 mg total) by mouth in the morning. 90 tablet 99   azelastine  (ASTELIN ) 0.1 % nasal spray Place 2 sprays into both nostrils 2 (two) times daily. Use in each nostril as directed 30 mL 11   bisoprolol  (ZEBETA ) 5 MG tablet Take 1 tablet (5 mg total) by mouth at bedtime. 90 tablet 3   blood glucose meter kit and supplies Dispense based on patient and insurance preference. Use up to four times daily as directed. (FOR ICD-10 E10.9, E11.9). 1 each 0   buPROPion  (WELLBUTRIN  XL) 150 MG 24 hr tablet Take 1 tablet (150 mg total) by mouth every morning for depression. 30 tablet 1    celecoxib  (CELEBREX ) 200 MG capsule Take 1 capsule (200 mg total) by mouth 2 (two) times daily. 180 capsule 1   CINNAMON PO Take 1 capsule by mouth daily with supper.     diclofenac  sodium (VOLTAREN ) 1 % GEL Apply 2-4 g topically 4 (four) times daily as needed (as directed for pain).     empagliflozin  (JARDIANCE ) 25 MG TABS tablet Take 1 tablet (25 mg total) by mouth daily. 90 tablet 1   escitalopram  (LEXAPRO ) 20 MG tablet Take 1 tablet (20 mg total) by mouth daily. 90 tablet 1   fenofibrate  (TRICOR ) 145 MG tablet Take 1 tablet (145 mg total)  by mouth at bedtime. 90 tablet 3   Ferrous Sulfate (IRON PO) Take 1 tablet by mouth daily. alternates days:1 tablet one day and 2 tablets the next day     gabapentin  (NEURONTIN ) 600 MG tablet Take 1 tablet (600 mg total) by mouth 3 (three) times daily. 90 tablet 4   glipiZIDE  (GLUCOTROL  XL) 10 MG 24 hr tablet Take 2 tablets (20 mg total) by mouth every morning. 90 tablet 1   glucose blood test strip Use to check blood sugar up to four times daily as directed 100 each 3   HYDROcodone -acetaminophen  (NORCO/VICODIN) 5-325 MG tablet Take 1 tablet by mouth every 6 (six) hours as needed. 15 tablet 0   Incontinence Supply Disposable (DEPEND ADJUSTABLE UNDERWEAR) MISC 1 Application by Does not apply route daily. 16 each 11   insulin  glargine (LANTUS  SOLOSTAR) 100 UNIT/ML Solostar Pen Start with injecting 4 units under the skin in the evenings. Increase by 2 units every 2 days until monring fasting glucose is to goal (140). Max units 60 per day. 15 mL 11   Insulin  Pen Needle (INSUPEN PEN NEEDLES) 32G X 4 MM MISC Use to inject insulin  as directed 100 each 3   Insulin  Pen Needle (PEN NEEDLES) 32G X 4 MM MISC USE TO INJECT INSULIN  100 each 3   Lancets MISC Use to check blood sugar up to four times daily as directed 100 each 3   levothyroxine  (SYNTHROID ) 125 MCG tablet Take 1 tablet (125 mcg total) by mouth daily before breakfast. 30 tablet 2   loratadine  (ALLERGY RELIEF) 10  MG tablet Take 1 tablet (10 mg total) by mouth every evening. 90 tablet 3   metroNIDAZOLE  (METROGEL ) 1 % gel Apply 1 application topically as needed (as directed to affected area). 45 g 2   Multiple Vitamins-Calcium  (ONE-A-DAY WOMENS PO) Take 1 tablet by mouth daily.     nitroGLYCERIN  (NITROSTAT ) 0.4 MG SL tablet Dissolve 1 tablet under the tongue every 5 minutes as needed for chest pain. Max of 3 doses, then 911. 75 tablet 2   omeprazole  (PRILOSEC) 40 MG capsule Take 1 capsule (40 mg total) by mouth every evening. 90 capsule 1   omeprazole  (PRILOSEC) 40 MG capsule Take 1 capsule (40 mg total) by mouth every evening. 90 capsule 1   polyethylene glycol powder (MIRALAX ) 17 GM/SCOOP powder Take 17 g by mouth daily. 255 g 11   rOPINIRole  (REQUIP ) 1 MG tablet Take 1 tablet (1 mg total) by mouth at bedtime. 90 tablet 0   Tiotropium Bromide-Olodaterol (STIOLTO RESPIMAT ) 2.5-2.5 MCG/ACT AERS Inhale 2 puffs into the lungs daily. 4 g 11   tiZANidine  (ZANAFLEX ) 2 MG tablet Take 1 tablet (2 mg total) by mouth 3 (three) times daily. 90 tablet 5   traMADol  (ULTRAM ) 50 MG tablet Take 1 tablet (50 mg total) by mouth every 6 (six) hours as needed for moderate pain (pain score 4-6) or severe pain (pain score 7-10). 30 tablet 0   triamcinolone  (KENALOG ) 0.1 % paste Apply a small amount to affected area twice daily for 2 - 3 weeks. 5 g 1   TURMERIC PO Take 1 capsule by mouth daily with supper.     Wheat Dextrin (BENEFIBER) POWD 1-2 TBSP work up to 3 x a day, mix in water or liquid 730 g 0   No current facility-administered medications on file prior to visit.    LABS/IMAGING: No results found. However, due to the size of the patient record, not all encounters were  searched. Please check Results Review for a complete set of results. No results found.  LIPID PANEL:    Component Value Date/Time   CHOL 281 (H) 11/14/2022 1141   TRIG 389 (H) 11/14/2022 1141   HDL 46 11/14/2022 1141   CHOLHDL 6.1 (H) 11/14/2022  1141   LDLCALC 161 (H) 11/14/2022 1141    WEIGHTS: Wt Readings from Last 3 Encounters:  06/06/23 183 lb (83 kg)  05/26/23 186 lb (84.4 kg)  04/22/23 185 lb 12.8 oz (84.3 kg)    VITALS: BP (!) 118/58   Pulse 74   Ht 5\' 4"  (1.626 m)   Wt 183 lb (83 kg)   SpO2 100%   BMI 31.41 kg/m   EXAM: Deferred  EKG: Deferred  ASSESSMENT: Dyslipidemia, goal LDL less than 70 History of high triglycerides Coronary artery disease with prior LAD stent Hypertension Type 2 diabetes  PLAN: 1.   Ms. Rheba Cedar has a dyslipidemia with a target LDL less than 70.  Apparently there was some confusion with her medications as [facility].  She was supposed to start rosuvastatin  after stopping simvastatin but apparently is no longer on the statin.  Her lipids were worse a few months after stopping her simvastatin.  She has a history of high triglycerides.  Will need to update her lipids and I would recommend likely then starting high intensity statin therapy.  Plan follow-up otherwise after about 3 to 4 months on lipid-lowering therapy with repeat lipid NMR and LP(a).  We discussed the possibility of her switching her cardiac care to me after Dr. Elizbeth Gum.  She does prefer however to be seen at [facility] in which I am only currently seeing lipid consults.  Hazle Lites, MD, [facility], FNLA, FACP  [place]  [facility]  Medical Director of the [facility]  [facility] Diplomate of the American Board of Clinical Lipidology Attending Cardiologist  Direct Dial: [phone]  Fax: [phone]  Website:  [email or web address]  Aviva Lemmings Roshaun Pound 06/06/2023, 1:49 PM

## 2023-06-09 ENCOUNTER — Other Ambulatory Visit: Payer: Self-pay | Admitting: Physician Assistant

## 2023-06-10 ENCOUNTER — Other Ambulatory Visit: Payer: Self-pay

## 2023-06-10 ENCOUNTER — Other Ambulatory Visit (HOSPITAL_COMMUNITY): Payer: Self-pay

## 2023-06-10 MED ORDER — LEVOTHYROXINE SODIUM 125 MCG PO TABS
125.0000 ug | ORAL_TABLET | Freq: Every day | ORAL | 2 refills | Status: DC
  Filled 2023-06-10: qty 30, 30d supply, fill #0
  Filled 2023-07-12: qty 30, 30d supply, fill #1
  Filled 2023-08-11: qty 30, 30d supply, fill #2

## 2023-06-11 ENCOUNTER — Other Ambulatory Visit (HOSPITAL_COMMUNITY): Payer: Self-pay

## 2023-06-11 ENCOUNTER — Other Ambulatory Visit: Payer: Self-pay

## 2023-06-11 DIAGNOSIS — E785 Hyperlipidemia, unspecified: Secondary | ICD-10-CM | POA: Diagnosis not present

## 2023-06-12 LAB — NMR, LIPOPROFILE
Cholesterol, Total: 318 mg/dL — ABNORMAL HIGH (ref 100–199)
HDL Particle Number: 41.1 umol/L (ref >=30.5)
HDL-C: 54 mg/dL (ref >39)
LDL Particle Number: 3190 nmol/L — ABNORMAL HIGH (ref <1000)
LDL Size: 20.2 nm — ABNORMAL LOW (ref >20.5)
LDL-C (NIH Calc): 199 mg/dL — ABNORMAL HIGH (ref 0–99)
LP-IR Score: 95 — ABNORMAL HIGH (ref <=45)
Small LDL Particle Number: 1976 nmol/L — ABNORMAL HIGH (ref <=527)
Triglycerides: 325 mg/dL — ABNORMAL HIGH (ref 0–149)

## 2023-06-12 LAB — LIPOPROTEIN A (LPA): Lipoprotein (a): 24.7 nmol/L (ref <75.0)

## 2023-06-17 ENCOUNTER — Encounter: Payer: Self-pay | Admitting: Physician Assistant

## 2023-06-17 NOTE — Telephone Encounter (Signed)
 Per PCP request scheduled pt for OV this week; pt aware of appt and agreeable

## 2023-06-17 NOTE — Telephone Encounter (Signed)
 Called pt stating her joints are just really sore joints, balance is off a little. Pt feel off the bed when sitting and fell on hip replacement side. Pt admits she is ok but wanted to let PCP know. But if PCP wants her to come in she will. Pt wants to know if she takes an extra Gabapentin  a day will this help? Been taking Tylenol  arthritis as well to help with pain, admits tramadol  is not helping patient. Please advise recommendations

## 2023-06-18 ENCOUNTER — Ambulatory Visit: Payer: Self-pay | Admitting: *Deleted

## 2023-06-18 DIAGNOSIS — E785 Hyperlipidemia, unspecified: Secondary | ICD-10-CM

## 2023-06-18 DIAGNOSIS — E782 Mixed hyperlipidemia: Secondary | ICD-10-CM

## 2023-06-19 ENCOUNTER — Ambulatory Visit: Attending: Physician Assistant

## 2023-06-19 ENCOUNTER — Other Ambulatory Visit: Payer: Self-pay

## 2023-06-19 ENCOUNTER — Other Ambulatory Visit (HOSPITAL_COMMUNITY): Payer: Self-pay

## 2023-06-19 ENCOUNTER — Encounter: Payer: Self-pay | Admitting: Pharmacist

## 2023-06-19 DIAGNOSIS — M6281 Muscle weakness (generalized): Secondary | ICD-10-CM | POA: Diagnosis not present

## 2023-06-19 DIAGNOSIS — M62838 Other muscle spasm: Secondary | ICD-10-CM | POA: Diagnosis not present

## 2023-06-19 DIAGNOSIS — R293 Abnormal posture: Secondary | ICD-10-CM | POA: Insufficient documentation

## 2023-06-19 DIAGNOSIS — R279 Unspecified lack of coordination: Secondary | ICD-10-CM | POA: Diagnosis not present

## 2023-06-19 NOTE — Therapy (Signed)
 OUTPATIENT PHYSICAL THERAPY FEMALE PELVIC TREATMENT   Patient Name: Saira Kramme MRN: [id number] DOB:1950/05/20, 73 y.o., female Today's Date: 06/19/2023  END OF SESSION:  PT End of Session - 06/19/23 1440     Visit Number 2    Date for PT Re-Evaluation 07/16/23    Authorization Type [employer] Medicare dual complete    PT Start Time 1445    PT Stop Time 1525    PT Time Calculation (min) 40 min    Activity Tolerance Patient tolerated treatment well    Behavior During Therapy WFL for tasks assessed/performed              Past Medical History:  Diagnosis Date   Adenomatous colon polyp 2015   Anemia    Anxiety    Breast cancer (HCC) 09/2019   left breast IMC   CAD (coronary artery disease)    a. 3/2007s/p DES to the LAD ([place] ); b. 01/2017 MV: EF 80%, small, mild apical ant defect w/o ischemia (felt to be breast atten). Low risk.   Depression    DM (diabetes mellitus) (HCC)    Emphysema lung (HCC)    Essential hypertension    GERD (gastroesophageal reflux disease)    Headache    secondary to neck surgery per patient   High triglycerides    HTN (hypertension)    Hyperlipidemia    Hypothyroid    OA (osteoarthritis)    right knee,hands   Overflow incontinence    PONV (postoperative nausea and vomiting)    s/p gallbladder    RLS (restless legs syndrome)    Urinary urgency    Uterine cancer (HCC) dx'd 2014   Past Surgical History:  Procedure Laterality Date   BREAST LUMPECTOMY WITH RADIOACTIVE SEED AND SENTINEL LYMPH NODE BIOPSY Left 10/07/2019   Procedure: LEFT BREAST LUMPECTOMY WITH RADIOACTIVE SEED AND SENTINEL LYMPH NODE BIOPSY;  Surgeon: Enid Harry, MD;  Location: [facility];  Service: General;  Laterality: Left;   CHOLECYSTECTOMY     CORONARY STENT PLACEMENT     CORONARY/GRAFT ACUTE MI REVASCULARIZATION     FINGER ARTHRODESIS Right 06/11/2019   Procedure: ARTHRODESIS INDEX FINGER DISTAL PHALANGEAL JOINT;  Surgeon: Brunilda Capra, MD;   Location: [facility];  Service: Orthopedics;  Laterality: Right;   FOOT SURGERY Left    LEFT HEART CATH AND CORONARY ANGIOGRAPHY N/A 02/16/2018   Procedure: LEFT HEART CATH AND CORONARY ANGIOGRAPHY;  Surgeon: Avanell Leigh, MD;  Location: [facility];  Service: Cardiovascular;  Laterality: N/A;   LEFT HEART CATH AND CORONARY ANGIOGRAPHY N/A 05/01/2020   Procedure: LEFT HEART CATH AND CORONARY ANGIOGRAPHY;  Surgeon: Avanell Leigh, MD;  Location: [facility];  Service: Cardiovascular;  Laterality: N/A;   PORTACATH PLACEMENT Right 11/23/2019   Procedure: INSERTION PORT-A-CATH WITH ULTRASOUND GUIDANCE;  Surgeon: Enid Harry, MD;  Location: [facility];  Service: General;  Laterality: Right;   REPLACEMENT TOTAL KNEE Right    SPINE SURGERY     TOTAL ABDOMINAL HYSTERECTOMY     FOR UTERUS CANCER   TOTAL HIP ARTHROPLASTY Left 2015   Patient Active Problem List   Diagnosis Date Noted   Restless leg syndrome 05/26/2023   Referred otalgia of left ear 11/19/2022   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   Erosive osteoarthritis of multiple sites 09/26/2021   Frequent headaches 05/31/2021   Bilateral impacted cerumen 02/22/2021   Nasal septal perforation 02/22/2021   Postnasal drip 02/22/2021  Hoarseness 01/25/2021   Hyperglycemia due to type 2 diabetes mellitus (HCC) 01/18/2021   Mixed simple and mucopurulent chronic bronchitis (HCC) 12/27/2020   Centrilobular emphysema (HCC) 07/31/2020   Shortness of breath 07/31/2020   Tongue lesion 04/30/2020   Hypothyroid    Anxiety    Anxiety and depression    Port-A-Cath in place 11/24/2019   Hepatic steatosis 09/28/2019   Malignant neoplasm of upper-outer quadrant of left breast in female, estrogen receptor positive (HCC) 09/02/2019   Trigger index finger of right hand 05/05/2019   Primary osteoarthritis of first carpometacarpal joint of right hand 05/05/2019    Osteoarthritis of finger of right hand 05/05/2019   Endometrial cancer (HCC) 10/23/2018   Cirrhosis of liver without ascites (HCC) 10/23/2018   Diarrhea 10/23/2018   Chest pain 02/15/2018   Coronary artery disease involving native coronary artery of native heart without angina pectoris 11/18/2017   Hyperlipidemia LDL goal <70 11/18/2017   Diabetes mellitus treated with insulin  and oral medication (HCC) 11/18/2017   Dizziness 11/18/2017   Type 2 diabetes mellitus without complication, without long-term current use of insulin  (HCC) 11/18/2017    PCP: Alda Amas, PA-C  REFERRING PROVIDER: Edmonia Gottron, PA-C   REFERRING DIAG: K52.9 (ICD-10-CM) - Chronic diarrhea  THERAPY DIAG:  Muscle weakness (generalized)  Unspecified lack of coordination  Abnormal posture  Other muscle spasm  Rationale for Evaluation and Treatment: Rehabilitation  ONSET DATE: 3 years ago  SUBJECTIVE:                                                                                                                                                                                           SUBJECTIVE STATEMENT: Pt states that she fell off bed Monday; she did not hit head. She is going to see PCP tomorrow to be evaluated for falls. Pt states that fecal incontinence is getting better. She is using Miralax  every other day in the morning. She still feels like she is not emptying completely and will notice a little drop of bowel movement when she urinates next. On average she feels like she is having daily bowel movements.   PAIN: 06/19/23 Are you having pain? Yes NPRS scale: 7/10 Pain location: hips, low back, LE  Pain type: aching Pain description: constant   Aggravating factors: walking, standing  Relieving factors: lying down   PRECAUTIONS: Other: hx of uterine and breast cancer   RED FLAGS: None   WEIGHT BEARING RESTRICTIONS: No  FALLS:  Has patient fallen in last 6 months? Yes. Number of  falls 1x, 12/20/23 (tripped over curb)  OCCUPATION: not working  ACTIVITY LEVEL : none  currently  PLOF: Independent  PATIENT GOALS: control over bowel movements   PERTINENT HISTORY:  Uterine  cancer with vaginal radiation 2014, invasive mammary carcinoma Lt 2021 with lumpectomy and chemoradiation, anastrozole , CAD with stent 2018, emphysema, cirrhosis, type 2 diabetes, cholecystectomy, Rt TKA, Lt THA, total abdominal hysterectomy (from cancer)   BOWEL MOVEMENT: Pain with bowel movement: No - states that she hardly feels it Type of bowel movement:Type (Bristol Stool Scale) 5-7, Frequency many times a day, usually small amounts every time she urinates, and Strain no - just comes out Fully empty rectum: No Leakage: Yes: unaware, with passing gas Pads: Yes: 3 briefs on bad days (day and night) Fiber supplement/laxative Yes and Miralax   URINATION: Pain with urination: No Fully empty bladder: Yes:   Stream: Strong Urgency: Yes  Frequency: over 3 or 4 hours; no nocturia Leakage: Urge to void, Walking to the bathroom, and standing up Pads: Yes: 3 briefs a day/night  INTERCOURSE:  Nor sexually active and no history of pain  PREGNANCY: Vaginal deliveries 1 Tearing No Episiotomy No C-section deliveries 1 Currently pregnant No  PROLAPSE: None   OBJECTIVE:  Note: Objective measures were completed at Evaluation unless otherwise noted.  04/23/23: PATIENT SURVEYS:   PFIQ-7: 14  COGNITION: Overall cognitive status: Within functional limits for tasks assessed     SENSATION: Light touch: Appears intact  FUNCTIONAL TESTS:  Squat: bil valgus knee collapse Single leg stance:  Rt: compensated trendelenburg with UE support  Lt: pelvic drop with UE support Curl-up test: significant abdominal distortion    GAIT: Assistive device utilized: None Comments: Rt antalgic pattern, decreased bil hip extension  POSTURE: rounded shoulders, forward head, decreased lumbar lordosis,  increased thoracic kyphosis, and posterior pelvic tilt   LUMBARAROM/PROM:  A/PROM A/PROM  Eval (% available)  Flexion 75  Extension 50  Right lateral flexion 50  Left lateral flexion 50  Right rotation 50  Left rotation 50   (Blank rows = not tested)   PALPATION:   General: significant tightness in bil obliques and lumbar paraspinals   Abdominal: abdominal distention and tightness; scar tissue                  External Perineal Exam: dry                             Internal Pelvic Floor: vaginal stenosis, scar tissue restriction, decreased mobility; decreased rectal sensation; decreased ability to create appropriate intra-rectal pressure when attempting to push like during bowel movement   Patient confirms identification and approves PT to assess internal pelvic floor and treatment Yes  PELVIC MMT:   MMT eval  Vaginal No active contraction  Internal Anal Sphincter No active contraction  External Anal Sphincter 1/5  Puborectalis 1/5  Diastasis Recti 4 finger widths   (Blank rows = not tested)        TONE: low  PROLAPSE: None detected, but difficulty with coordination of appropriate bearing down   TODAY'S TREATMENT:  DATE:  06/19/23 Neuromuscular re-education: Pt provides verbal consent for internal vaginal/rectal pelvic floor exam. Internal rectal manual biofeedback in side lying Quick flicks Bulge/push training Seated hip adduction ball press with transversus abdominus and pelvic floor muscle 2 x 10 Seated hip abduction red band with transversus abdominus and pelvic floor muscle 2 x 10 Seated resisted march red band with transversus abdominus and pelvic floor muscle 2 x 10   04/23/23  EVAL  Neuromuscular re-education: Pt provides verbal consent for internal vaginal/rectal pelvic floor exam. Internal vaginal and rectal pelvic floor muscle  contraction training Quick flicks   PATIENT EDUCATION:  Education details: See above Person educated: Patient Education method: Explanation, Demonstration, Tactile cues, Verbal cues, and Handouts Education comprehension: verbalized understanding  HOME EXERCISE PROGRAM: [id number]  ASSESSMENT:  CLINICAL IMPRESSION: Pt is seeing some improvement in fecal incontinence. She has been more consistent with bowel movements as well as she has been keeping up with her Miralax . However, she still has some fecal incontinence when urinating the time after a bowel movement and does not have sensation of this. Pt demonstrate some improvement in initial pelvic floor muscle contraction palpated rectally; however, per her report, she is unaware of when she is contracting vs relaxing. We worked on repeated contractions with multimodal cues for improved proprioception and sensation. She ended up having improved sensation of contraction, but continued to have difficulty with sensing relaxation. In order to help improve this, we performed assisted relaxation manually and this did help her, but she seemed frustrated by not being able to do as well as she wanted. We progressed home exercises to include seated stability activities. She will continue to benefit from skilled PT intervention in order to improve fecal and urinary incontinence, begin/progress functional strengthening program, and improve quality of life.   OBJECTIVE IMPAIRMENTS: decreased activity tolerance, decreased coordination, decreased endurance, decreased mobility, decreased ROM, decreased strength, increased fascial restrictions, increased muscle spasms, impaired tone, postural dysfunction, and pain.   ACTIVITY LIMITATIONS: continence  PARTICIPATION LIMITATIONS: community activity  PERSONAL FACTORS: 3+ comorbidities: medical history are also affecting patient's functional outcome.   REHAB POTENTIAL: Good  CLINICAL DECISION MAKING:  Evolving/moderate complexity  EVALUATION COMPLEXITY: Moderate   GOALS: Goals reviewed with patient? Yes  SHORT TERM GOALS: Updated 06/19/23   Pt will be independent with HEP.   Baseline: Goal status: IN PROGRESS 06/19/23  2.  Pt will report 25% improvement in fecal incontinence.  Baseline:  Goal status: IN PROGRESS 06/19/23  3.  Pt will be able to teach back and utilize urge suppression technique in order to help reduce number of trips to the bathroom.    Baseline:  Goal status: IN PROGRESS 06/19/23  4.  Pt will decrease number of bowel movements to no more than 3 a day in order to decrease skin irritation and demonstrate more complete emptying.  Baseline:  Goal status: IN PROGRESS 06/19/23  5.  Pt will be independent with use of squatty potty, relaxed toileting mechanics, and improved bowel movement techniques in order to increase ease of bowel movements and complete evacuation so she can decrease leaking.   Baseline:  Goal status: IN PROGRESS 06/19/23   LONG TERM GOALS: Updated 06/19/23  Pt will be independent with advanced HEP.   Baseline:  Goal status: IN PROGRESS 06/19/23  2.  Pt will report 75% improvement in fecal incontinence.  Baseline:  Goal status: IN PROGRESS 06/19/23  3.  Pt will be able to pass gas without fecal incontinence in order to  decrease number of briefs she is using daily.  Baseline:  Goal status: IN PROGRESS 06/19/23  4.  Pt will report no more than 1 brief a day in order to decrease amount she is having to buy and demonstrate improved bowel control.  Baseline:  Goal status: IN PROGRESS 06/19/23  5.  Pt will demonstrate normal pelvic floor muscle tone and A/ROM, able to achieve 4/5 strength with contractions and 10 sec endurance, in order to provide appropriate lumbopelvic support in functional activities.   Baseline:  Goal status: IN PROGRESS 06/19/23  6.  Pt will be able to go 2-3 hours in between voids without urgency or incontinence in order to improve  QOL and perform all functional activities with less difficulty.   Baseline:  Goal status: IN PROGRESS 06/19/23  PLAN:  PT FREQUENCY: 1-2x/week  PT DURATION: 12 weeks  PLANNED INTERVENTIONS: 97110-Therapeutic exercises, 97530- Therapeutic activity, 97112- Neuromuscular re-education, 97535- Self Care, 96045- Manual therapy, Dry Needling, and Biofeedback  PLAN FOR NEXT SESSION: Return to internal rectal exam to work on biofeedback of pelvic floor muscle contraction; biofeedback; hip/core exercises.    Verlena Glenn, PT, DPT06/05/253:28 PM

## 2023-06-20 ENCOUNTER — Other Ambulatory Visit: Payer: Self-pay

## 2023-06-20 ENCOUNTER — Ambulatory Visit (INDEPENDENT_AMBULATORY_CARE_PROVIDER_SITE_OTHER): Admitting: Physician Assistant

## 2023-06-20 VITALS — BP 128/68 | HR 67 | Temp 98.0°F | Ht 64.0 in | Wt 184.8 lb

## 2023-06-20 DIAGNOSIS — Z794 Long term (current) use of insulin: Secondary | ICD-10-CM | POA: Diagnosis not present

## 2023-06-20 DIAGNOSIS — R296 Repeated falls: Secondary | ICD-10-CM

## 2023-06-20 DIAGNOSIS — C50412 Malignant neoplasm of upper-outer quadrant of left female breast: Secondary | ICD-10-CM

## 2023-06-20 DIAGNOSIS — Z9189 Other specified personal risk factors, not elsewhere classified: Secondary | ICD-10-CM

## 2023-06-20 DIAGNOSIS — R531 Weakness: Secondary | ICD-10-CM | POA: Diagnosis not present

## 2023-06-20 DIAGNOSIS — E118 Type 2 diabetes mellitus with unspecified complications: Secondary | ICD-10-CM | POA: Insufficient documentation

## 2023-06-20 DIAGNOSIS — M6258 Muscle wasting and atrophy, not elsewhere classified, other site: Secondary | ICD-10-CM

## 2023-06-20 DIAGNOSIS — R42 Dizziness and giddiness: Secondary | ICD-10-CM | POA: Diagnosis not present

## 2023-06-20 DIAGNOSIS — E1165 Type 2 diabetes mellitus with hyperglycemia: Secondary | ICD-10-CM

## 2023-06-20 DIAGNOSIS — Z170 Estrogen receptor positive status [ER+]: Secondary | ICD-10-CM

## 2023-06-20 DIAGNOSIS — S91302A Unspecified open wound, left foot, initial encounter: Secondary | ICD-10-CM | POA: Diagnosis not present

## 2023-06-20 LAB — CBC WITH DIFFERENTIAL/PLATELET
Basophils Absolute: 0 10*3/uL (ref 0.0–0.1)
Basophils Relative: 0.3 % (ref 0.0–3.0)
Eosinophils Absolute: 0.1 10*3/uL (ref 0.0–0.7)
Eosinophils Relative: 0.7 % (ref 0.0–5.0)
HCT: 43.4 % (ref 36.0–46.0)
Hemoglobin: 14.3 g/dL (ref 12.0–15.0)
Lymphocytes Relative: 70.7 % — ABNORMAL HIGH (ref 12.0–46.0)
Lymphs Abs: 10.5 10*3/uL — ABNORMAL HIGH (ref 0.7–4.0)
MCHC: 32.8 g/dL (ref 30.0–36.0)
MCV: 93.8 fl (ref 78.0–100.0)
Monocytes Absolute: 0.6 10*3/uL (ref 0.1–1.0)
Monocytes Relative: 4.3 % (ref 3.0–12.0)
Neutro Abs: 3.6 10*3/uL (ref 1.4–7.7)
Neutrophils Relative %: 24 % — ABNORMAL LOW (ref 43.0–77.0)
Platelets: 257 10*3/uL (ref 150.0–400.0)
RBC: 4.63 Mil/uL (ref 3.87–5.11)
RDW: 15.4 % (ref 11.5–15.5)
WBC: 14.9 10*3/uL — ABNORMAL HIGH (ref 4.0–10.5)

## 2023-06-20 LAB — COMPREHENSIVE METABOLIC PANEL WITH GFR
ALT: 17 U/L (ref 0–35)
AST: 16 U/L (ref 0–37)
Albumin: 4.4 g/dL (ref 3.5–5.2)
Alkaline Phosphatase: 48 U/L (ref 39–117)
BUN: 23 mg/dL (ref 6–23)
CO2: 30 meq/L (ref 19–32)
Calcium: 10.6 mg/dL — ABNORMAL HIGH (ref 8.4–10.5)
Chloride: 100 meq/L (ref 96–112)
Creatinine, Ser: 0.94 mg/dL (ref 0.40–1.20)
GFR: 60.22 mL/min (ref >60.00)
Glucose, Bld: 225 mg/dL — ABNORMAL HIGH (ref 70–99)
Potassium: 4.3 meq/L (ref 3.5–5.1)
Sodium: 137 meq/L (ref 135–145)
Total Bilirubin: 0.3 mg/dL (ref 0.2–1.2)
Total Protein: 7 g/dL (ref 6.0–8.3)

## 2023-06-20 LAB — C-REACTIVE PROTEIN: CRP: <1 mg/dL (ref 0.5–20.0)

## 2023-06-20 LAB — POCT GLYCOSYLATED HEMOGLOBIN (HGB A1C): Hemoglobin A1C: 10 % — AB (ref 4.0–5.6)

## 2023-06-20 MED ORDER — MUPIROCIN CALCIUM 2 % EX CREA
1.0000 | TOPICAL_CREAM | Freq: Three times a day (TID) | CUTANEOUS | 0 refills | Status: DC
  Filled 2023-06-20: qty 15, 5d supply, fill #0

## 2023-06-20 NOTE — Progress Notes (Signed)
 Patient ID: Felicia [name], female    DOB: 1950/06/20, 73 y.o.   MRN: [id number]   Assessment & Plan:   Multiple falls -     CBC with Differential/Platelet -     Comprehensive metabolic panel with GFR -     C-reactive protein -     Amb Referral to Clinical Pharmacist -     AMB Referral [facility] Care Management  Weakness -     CBC with Differential/Platelet -     Comprehensive metabolic panel with GFR -     C-reactive protein -     Amb Referral to Clinical Pharmacist -     AMB Referral [facility] Care Management  Episode of dizziness -     Amb Referral to Clinical Pharmacist -     AMB Referral [facility] Care Management  Muscle atrophy of lower extremity -     AMB Referral [facility] Care Management  Type 2 diabetes mellitus with hyperglycemia, with long-term current use of insulin  (HCC) -     POCT glycosylated hemoglobin (Hb A1C) -     Amb Referral to Clinical Pharmacist -     AMB Referral [facility] Care Management  Malignant neoplasm of upper-outer quadrant of left breast in female, estrogen receptor positive (HCC) -     CBC with Differential/Platelet -     Comprehensive metabolic panel with GFR -     C-reactive protein -     AMB Referral [facility] Care Management  At risk for polypharmacy -     Amb Referral to Clinical Pharmacist -     AMB Referral [facility] Care Management  Wound of left foot -     Mupirocin  Calcium ; Apply 1 Application topically 3 (three) times daily.  Dispense: 15 g; Refill: 0    Assessment & Plan Fall with musculoskeletal pain Experienced a fall on June 16, 2023, resulting in soreness in the hips, elbow, and right shin due to balance issues and muscle weakness, particularly in the right leg. Muscle atrophy from decreased physical activity post-hip surgery in 2015 contributes to instability and increased fall risk. She uses a rollator and cane for mobility and reports tenderness in the right shin and left knee. No new major injuries identified since the fall, but ongoing soreness  persists. - Refer to physical therapy for leg strengthening exercises. - Coordinate with sports medicine orthopedic provider for evaluation of right lower leg and right hip. - Advise caution with movements to prevent further falls.  Diabetes Mellitus with poor glycemic control A1c is elevated at 10, indicating poor glycemic control. Blood glucose levels fluctuate significantly, ranging from 93 to 200. Currently on 52 units of insulin  and glipizide  20 mg in the morning. She is hesitant to increase insulin  dosage due to episodes of hypoglycemia, necessitating a balance between glycemic control and hypoglycemia risk. - Review and adjust diabetes management plan. - Consider referral to a clinical pharmacist for medication review and help with management. - Monitor blood glucose levels closely, especially during episodes of dizziness or feeling off.  Polypharmacy and potential medication side effects On multiple medications, including gabapentin , tramadol , and a muscle relaxer, which may contribute to dizziness and balance issues. Potential drug interactions and side effects may exacerbate vertigo and balance problems. A comprehensive medication review is necessary to identify and adjust medications contributing to these issues. - Refer to clinical pharmacist for a comprehensive medication review. - Coordinate with the pharmacist to identify and adjust medications contributing to dizziness and balance issues.  Left  foot wound Tender spot on the left foot from a previous injury involving dropped scissors. The wound is tender but not infected. Given her diabetes, there is a heightened risk for complications, necessitating monitoring for signs of infection. - Prescribe mupirocin  cream for the left foot wound. - Instruct to wash the wound with soap and water and monitor for signs of infection.  Hyperlipidemia Advised to start rosuvastatin  40 mg by another provider due to elevated cholesterol levels. She  has not yet started the medication. Addressing hyperlipidemia is necessary to reduce cardiovascular risk. - Initiate rosuvastatin  40 mg as previously recommended by the cardiologist.  General Health Maintenance Requires regular monitoring for cancer and other health parameters. Recent labs and PET scans are necessary for ongoing assessment. - Order CBC, CMP, and CRP to monitor overall health and check for any abnormalities. - Ensure regular PET scans for cancer surveillance.  Follow-up Requires follow-up for diabetes management, medication review, and physical therapy. Coordination with specialists is necessary for comprehensive care. - Schedule follow-up appointment in a few weeks to reassess health status and review lab results. - Coordinate with physical therapy and clinical pharmacist for ongoing management.         No follow-ups on file.    Subjective:    Chief Complaint  Patient presents with   Fall    Pt in office today for multiple falls; pt states her legs are giving out; was able to get up without help;     HPI Discussed the use of AI scribe software for clinical note transcription with the patient, who gave verbal consent to proceed.  History of Present Illness Felicia [name] is a 73 year old female with diabetes who presents with recent falls and uncontrolled blood sugar levels.  She experienced a fall on Monday, June 2nd, due to balance issues, feeling wobbly and tired before the incident. The fall occurred as she was sitting on her bed and fell forward onto her elbow and knees, resulting in a sore elbow with peeled skin and difficulty straightening her leg due to hip pain. She spent the rest of Monday and Tuesday in bed due to soreness.  She uses a rollator and cane for mobility and reports weakness and soreness in her right shin, describing it as feeling like 'somebody hauled off and kicked me in the shins.' She has a history of a right hip replacement in 2015  and has been relying on her right leg for support, but now feels it may not hold her weight anymore. She also has a history of falls due to her right leg giving out.  Her diabetes is characterized by fluctuating blood sugar levels, with an A1c of 10 and blood sugar readings ranging from 93 to 200. She is currently on 52 units of insulin  and glipizide  20 mg in the morning. She is concerned about her blood sugar control and does not want to return to metformin .  She experiences occasional dizziness, particularly when rolling over in bed, and has a history of vertigo previously treated by a physical therapist. No dizziness while walking but notes balance issues upon waking. She reports muscle atrophy due to decreased physical activity after hip issues, having previously walked four miles a day.  She is currently taking tramadol  for headaches and a muscle relaxer three times a day. She experiences headaches that sometimes spread across her head and takes additional Tylenol  for relief. She is concerned about potential medication interactions contributing to her symptoms.  She is attending  pelvic floor physical therapy and is working on strengthening her core. She has a balance ball and elastic bands for exercises at home. She borrows a neighbor's car to attend therapy sessions at [facility], which does not incur any cost for her.  She has a sore spot on her left foot from dropping scissors, which she is monitoring for signs of infection.     Past Medical History:  Diagnosis Date   Adenomatous colon polyp 2015   Anemia    Anxiety    Breast cancer (HCC) 09/2019   left breast IMC   CAD (coronary artery disease)    a. 3/2007s/p DES to the LAD ([place] ); b. 01/2017 MV: EF 80%, small, mild apical ant defect w/o ischemia (felt to be breast atten). Low risk.   Depression    DM (diabetes mellitus) (HCC)    Emphysema lung (HCC)    Essential hypertension    GERD (gastroesophageal reflux disease)     Headache    secondary to neck surgery per patient   High triglycerides    HTN (hypertension)    Hyperlipidemia    Hypothyroid    OA (osteoarthritis)    right knee,hands   Overflow incontinence    PONV (postoperative nausea and vomiting)    s/p gallbladder    RLS (restless legs syndrome)    Urinary urgency    Uterine cancer (HCC) dx'd 2014    Past Surgical History:  Procedure Laterality Date   BREAST LUMPECTOMY WITH RADIOACTIVE SEED AND SENTINEL LYMPH NODE BIOPSY Left 10/07/2019   Procedure: LEFT BREAST LUMPECTOMY WITH RADIOACTIVE SEED AND SENTINEL LYMPH NODE BIOPSY;  Surgeon: Enid Harry, MD;  Location: [facility];  Service: General;  Laterality: Left;   CHOLECYSTECTOMY     CORONARY STENT PLACEMENT     CORONARY/GRAFT ACUTE MI REVASCULARIZATION     FINGER ARTHRODESIS Right 06/11/2019   Procedure: ARTHRODESIS INDEX FINGER DISTAL PHALANGEAL JOINT;  Surgeon: Brunilda Capra, MD;  Location: [facility];  Service: Orthopedics;  Laterality: Right;   FOOT SURGERY Left    LEFT HEART CATH AND CORONARY ANGIOGRAPHY N/A 02/16/2018   Procedure: LEFT HEART CATH AND CORONARY ANGIOGRAPHY;  Surgeon: Avanell Leigh, MD;  Location: [facility];  Service: Cardiovascular;  Laterality: N/A;   LEFT HEART CATH AND CORONARY ANGIOGRAPHY N/A 05/01/2020   Procedure: LEFT HEART CATH AND CORONARY ANGIOGRAPHY;  Surgeon: Avanell Leigh, MD;  Location: [facility];  Service: Cardiovascular;  Laterality: N/A;   PORTACATH PLACEMENT Right 11/23/2019   Procedure: INSERTION PORT-A-CATH WITH ULTRASOUND GUIDANCE;  Surgeon: Enid Harry, MD;  Location:  SURGERY CENTER;  Service: General;  Laterality: Right;   REPLACEMENT TOTAL KNEE Right    SPINE SURGERY     TOTAL ABDOMINAL HYSTERECTOMY     FOR UTERUS CANCER   TOTAL HIP ARTHROPLASTY Left 2015    Family History  Problem Relation Age of Onset   AAA (abdominal aortic aneurysm) Mother    Thyroid   disease Mother    Colon cancer Mother    Diabetes Father    Alzheimer's disease Father    Arthritis Sister    Heart disease Sister    Throat cancer Sister    Kidney failure Sister    Diabetes Sister    Diabetes Brother    Diabetes Brother    Breast cancer Cousin     Social History   Tobacco Use   Smoking status: Never   Smokeless tobacco: Never  Vaping Use  Vaping status: Never Used  Substance Use Topics   Alcohol use: Yes    Comment: OCCASIONALLY   Drug use: No     Allergies  Allergen Reactions   Chloraprep One Step [Chlorhexidine  Gluconate] Rash   Estrogens Other (See Comments)    PATIENT HAS A HISTORY OF CANCER AND HAS BEEN TOLD TO NEVER TAKE ANYTHING CONTAINING ESTROGEN, AS IT MIGHT CAUSE A RECURRENCE   Shellfish-Derived Products Nausea And Vomiting   Shrimp [Shellfish Allergy] Nausea And Vomiting    Review of Systems NEGATIVE UNLESS OTHERWISE INDICATED IN HPI      Objective:     BP 128/68 (BP Location: Right Arm, Patient Position: Sitting, Cuff Size: Normal)   Pulse 67   Temp 98 F (36.7 C) (Temporal)   Ht 5\' 4"  (1.626 m)   Wt 184 lb 12.8 oz (83.8 kg)   SpO2 97%   BMI 31.72 kg/m   Wt Readings from Last 3 Encounters:  06/20/23 184 lb 12.8 oz (83.8 kg)  06/06/23 183 lb (83 kg)  05/26/23 186 lb (84.4 kg)    BP Readings from Last 3 Encounters:  06/20/23 128/68  06/06/23 (!) 118/58  06/03/23 (!) 144/90     Physical Exam Vitals and nursing note reviewed.  Constitutional:      General: She is not in acute distress.    Appearance: Normal appearance. She is not ill-appearing.  HENT:     Head: Normocephalic.     Right Ear: External ear normal.     Left Ear: External ear normal.     Nose: No congestion.     Mouth/Throat:     Mouth: Mucous membranes are moist.     Pharynx: No oropharyngeal exudate or posterior oropharyngeal erythema.  Eyes:     Extraocular Movements: Extraocular movements intact.     Conjunctiva/sclera: Conjunctivae normal.      Pupils: Pupils are equal, round, and reactive to light.  Cardiovascular:     Rate and Rhythm: Normal rate and regular rhythm.     Pulses: Normal pulses. No decreased pulses.     Heart sounds: Normal heart sounds. No murmur heard. Pulmonary:     Effort: Pulmonary effort is normal. No respiratory distress.     Breath sounds: Normal breath sounds. No wheezing.  Musculoskeletal:     Cervical back: Normal range of motion.     Right lower leg: No edema.     Left lower leg: No edema.     Comments: Atrophy muscles noted bilateral lower legs   Skin:    General: Skin is warm.     Findings: Lesion (dorsum left foot small superficial wound noted without erythema or edema) present.  Neurological:     General: No focal deficit present.     Mental Status: She is alert and oriented to person, place, and time.     Cranial Nerves: No cranial nerve deficit.     Sensory: No sensory deficit.  Psychiatric:        Mood and Affect: Mood normal.        Behavior: Behavior normal.             Alton Tremblay M Randeep Biondolillo, PA-C

## 2023-06-23 ENCOUNTER — Ambulatory Visit: Payer: Self-pay | Admitting: Physician Assistant

## 2023-06-25 ENCOUNTER — Ambulatory Visit

## 2023-06-25 ENCOUNTER — Telehealth: Payer: Self-pay | Admitting: *Deleted

## 2023-06-25 ENCOUNTER — Encounter: Payer: Self-pay | Admitting: Physician Assistant

## 2023-06-25 ENCOUNTER — Other Ambulatory Visit: Payer: Self-pay | Admitting: Physician Assistant

## 2023-06-25 ENCOUNTER — Other Ambulatory Visit: Payer: Self-pay

## 2023-06-25 VITALS — Ht 64.0 in | Wt 184.0 lb

## 2023-06-25 DIAGNOSIS — E1165 Type 2 diabetes mellitus with hyperglycemia: Secondary | ICD-10-CM

## 2023-06-25 DIAGNOSIS — R296 Repeated falls: Secondary | ICD-10-CM

## 2023-06-25 DIAGNOSIS — Z0000 Encounter for general adult medical examination without abnormal findings: Secondary | ICD-10-CM

## 2023-06-25 DIAGNOSIS — R531 Weakness: Secondary | ICD-10-CM

## 2023-06-25 NOTE — Patient Instructions (Signed)
 Felicia [name] , Thank you for taking time out of your busy schedule to complete your Annual Wellness Visit with me. I enjoyed our conversation and look forward to speaking with you again next year. I, as well as your care team,  appreciate your ongoing commitment to your health goals. Please review the following plan we discussed and let me know if I can assist you in the future. Your Game plan/ To Do List    Referrals: If you haven't heard from the office you've been referred to, please reach out to them at the phone provided.   Follow up Visits: Next Medicare AWV with our clinical staff: 06/29/24   Have you seen your provider in the last 6 months (3 months if uncontrolled diabetes)? Yes Next Office Visit with your provider: not scheduled at this time   Clinician Recommendations:  Each day, aim for 6 glasses of water, plenty of protein in your diet and try to get up and walk/ stretch every hour for 5-10 minutes at a time.        This is a list of the screening recommended for you and due dates:  Health Maintenance  Topic Date Due   Zoster (Shingles) Vaccine (1 of 2) Never done   Eye exam for diabetics  04/24/2023   Colon Cancer Screening  10/06/2023   Medicare Annual Wellness Visit  07/20/2023*   Flu Shot  08/15/2023   Hemoglobin A1C  09/20/2023   Complete foot exam   10/23/2023   Yearly kidney health urinalysis for diabetes  03/17/2024   Yearly kidney function blood test for diabetes  06/19/2024   Mammogram  08/25/2024   DTaP/Tdap/Td vaccine (3 - Tdap) 02/07/2029   Pneumonia Vaccine  Completed   DEXA scan (bone density measurement)  Completed   Hepatitis C Screening  Completed   HPV Vaccine  Aged Out   Meningitis B Vaccine  Aged Out   COVID-19 Vaccine  Discontinued  *Topic was postponed. The date shown is not the original due date.    Advanced directives: (Declined) Advance directive discussed with you today. Even though you declined this today, please call our office should you  change your mind, and we can give you the proper paperwork for you to fill out. Advance Care Planning is important because it:  [x]  Makes sure you receive the medical care that is consistent with your values, goals, and preferences  [x]  It provides guidance to your family and loved ones and reduces their decisional burden about whether or not they are making the right decisions based on your wishes.  Follow the link provided in your after visit summary or read over the paperwork we have mailed to you to help you started getting your Advance Directives in place. If you need assistance in completing these, please reach out to us  so that we can help you!  See attachments for Preventive Care and Fall Prevention Tips.

## 2023-06-25 NOTE — Progress Notes (Signed)
 Care Guide Pharmacy Note  06/25/2023 Name: Ozelle Brubacher MRN: [id number] DOB: October 19, 1950  Referred By: Alda Amas, PA-C Reason for referral: Complex Care Management (Outreach to schedule referral with pharmacist ) and Call Attempt #1   Suzzette Gasparro is a 73 y.o. year old female who is a primary care patient of Allwardt, Alyssa M, PA-C.  Devorah Givhan was referred to the pharmacist for assistance related to: DMII  An unsuccessful telephone outreach was attempted today to contact the patient who was referred to the pharmacy team for assistance with medication management. Additional attempts will be made to contact the patient.  Kandis Ormond, CMA [place]  [facility], [facility] Guide Direct Dial: [phone]  Fax: [phone] Website: [email or web address]

## 2023-06-25 NOTE — Progress Notes (Signed)
 Subjective:   Felicia [name] is a 73 y.o. who presents for a Medicare Wellness preventive visit.  As a reminder, Annual Wellness Visits don't include a physical exam, and some assessments may be limited, especially if this visit is performed virtually. We may recommend an in-person follow-up visit with your provider if needed.  Visit Complete: Virtual I connected with  Felicia [name] on 06/25/23 by a audio enabled telemedicine application and verified that I am speaking with the correct person using two identifiers.  Patient Location: Home  Provider Location: Home Office  I discussed the limitations of evaluation and management by telemedicine. The patient expressed understanding and agreed to proceed.  Vital Signs: Because this visit was a virtual/telehealth visit, some criteria may be missing or patient reported. Any vitals not documented were not able to be obtained and vitals that have been documented are patient reported.  VideoDeclined- This patient declined Librarian, academic. Therefore the visit was completed with audio only.  Persons Participating in Visit: Patient.  AWV Questionnaire: No: Patient Medicare AWV questionnaire was not completed prior to this visit.        Objective:     Today's Vitals   06/25/23 1553 06/25/23 1554  Weight: 184 lb (83.5 kg)   Height: 5' 4 (1.626 m)   PainSc:  7    Body mass index is 31.58 kg/m.     06/25/2023    4:02 PM 04/23/2023    2:08 PM 03/24/2023    1:29 PM 10/03/2022    3:05 PM 09/24/2022    1:53 PM 08/05/2022   12:31 PM 07/07/2022    6:03 PM  Advanced Directives  Does Patient Have a Medical Advance Directive? No No No No No No No  Would patient like information on creating a medical advance directive? No - Patient declined No - Patient declined  Yes (MAU/Ambulatory/Procedural Areas - Information given)  No - Patient declined No - Patient declined    Current Medications (verified) Outpatient  Encounter Medications as of 06/25/2023  Medication Sig   ACCU-CHEK GUIDE test strip USE TO check blood glucose UP TO four times daily AS DIRECTED   Accu-Chek Softclix Lancets lancets USE TO check blood glucose UP TO four times daily AS DIRECTED   albuterol  (VENTOLIN  HFA) 108 (90 Base) MCG/ACT inhaler Inhale 2 puffs into the lungs every 6 (six) hours as needed for wheezing or shortness of breath.   anastrozole  (ARIMIDEX ) 1 MG tablet Take 1 tablet (1 mg total) by mouth daily.   Ascorbic Acid (VITAMIN C PO) Take 1 tablet by mouth daily.   aspirin  EC (ASPIRIN  81) 81 MG tablet Take 1 tablet (81 mg total) by mouth in the morning.   azelastine  (ASTELIN ) 0.1 % nasal spray Place 2 sprays into both nostrils 2 (two) times daily. Use in each nostril as directed   bisoprolol  (ZEBETA ) 5 MG tablet Take 1 tablet (5 mg total) by mouth at bedtime.   blood glucose meter kit and supplies Dispense based on patient and insurance preference. Use up to four times daily as directed. (FOR ICD-10 E10.9, E11.9).   buPROPion  (WELLBUTRIN  XL) 150 MG 24 hr tablet Take 1 tablet (150 mg total) by mouth every morning for depression.   celecoxib  (CELEBREX ) 200 MG capsule Take 1 capsule (200 mg total) by mouth 2 (two) times daily.   CINNAMON PO Take 1 capsule by mouth daily with supper.   clotrimazole -betamethasone  (LOTRISONE) cream Apply 1 Application topically 2 (two) times daily.  diclofenac  sodium (VOLTAREN ) 1 % GEL Apply 2-4 g topically 4 (four) times daily as needed (as directed for pain).   empagliflozin  (JARDIANCE ) 25 MG TABS tablet Take 1 tablet (25 mg total) by mouth daily.   escitalopram  (LEXAPRO ) 20 MG tablet Take 1 tablet (20 mg total) by mouth daily.   fenofibrate  (TRICOR ) 145 MG tablet Take 1 tablet (145 mg total) by mouth at bedtime.   Ferrous Sulfate (IRON PO) Take 1 tablet by mouth daily. alternates days:1 tablet one day and 2 tablets the next day   gabapentin  (NEURONTIN ) 600 MG tablet Take 1 tablet (600 mg total)  by mouth 3 (three) times daily.   glipiZIDE  (GLUCOTROL  XL) 10 MG 24 hr tablet Take 2 tablets (20 mg total) by mouth every morning.   glucose blood test strip Use to check blood sugar up to four times daily as directed   HYDROcodone -acetaminophen  (NORCO/VICODIN) 5-325 MG tablet Take 1 tablet by mouth every 6 (six) hours as needed.   Incontinence Supply Disposable (DEPEND ADJUSTABLE UNDERWEAR) MISC 1 Application by Does not apply route daily.   insulin  glargine (LANTUS  SOLOSTAR) 100 UNIT/ML Solostar Pen Start with injecting 4 units under the skin in the evenings. Increase by 2 units every 2 days until monring fasting glucose is to goal (140). Max units 60 per day.   Insulin  Pen Needle (INSUPEN PEN NEEDLES) 32G X 4 MM MISC Use to inject insulin  as directed   Insulin  Pen Needle (PEN NEEDLES) 32G X 4 MM MISC USE TO INJECT INSULIN    Lancets MISC Use to check blood sugar up to four times daily as directed   levothyroxine  (SYNTHROID ) 125 MCG tablet Take 1 tablet (125 mcg total) by mouth daily before breakfast.   loratadine  (ALLERGY RELIEF) 10 MG tablet Take 1 tablet (10 mg total) by mouth every evening.   metroNIDAZOLE  (METROGEL ) 1 % gel Apply 1 application topically as needed (as directed to affected area).   Multiple Vitamins-Calcium  (ONE-A-DAY WOMENS PO) Take 1 tablet by mouth daily.   mupirocin  cream (BACTROBAN ) 2 % Apply 1 Application topically 3 (three) times daily.   nitroGLYCERIN  (NITROSTAT ) 0.4 MG SL tablet Dissolve 1 tablet under the tongue every 5 minutes as needed for chest pain. Max of 3 doses, then 911.   omeprazole  (PRILOSEC) 40 MG capsule Take 1 capsule (40 mg total) by mouth every evening.   polyethylene glycol powder (MIRALAX ) 17 GM/SCOOP powder Take 17 g by mouth daily.   rOPINIRole  (REQUIP ) 1 MG tablet Take 1 tablet (1 mg total) by mouth at bedtime.   Tiotropium Bromide-Olodaterol (STIOLTO RESPIMAT ) 2.5-2.5 MCG/ACT AERS Inhale 2 puffs into the lungs daily.   tiZANidine  (ZANAFLEX ) 2 MG  tablet Take 1 tablet (2 mg total) by mouth 3 (three) times daily.   traMADol  (ULTRAM ) 50 MG tablet Take 1 tablet (50 mg total) by mouth every 6 (six) hours as needed for moderate pain (pain score 4-6) or severe pain (pain score 7-10).   triamcinolone  (KENALOG ) 0.1 % paste Apply a small amount to affected area twice daily for 2 - 3 weeks.   TURMERIC PO Take 1 capsule by mouth daily with supper.   Wheat Dextrin (BENEFIBER) POWD 1-2 TBSP work up to 3 x a day, mix in water or liquid   [DISCONTINUED] omeprazole  (PRILOSEC) 40 MG capsule Take 1 capsule (40 mg total) by mouth every evening.   No facility-administered encounter medications on file as of 06/25/2023.    Allergies (verified) Chloraprep one step [chlorhexidine  gluconate], Estrogens, Shellfish-derived  products, and Shrimp [shellfish allergy]   History: Past Medical History:  Diagnosis Date   Adenomatous colon polyp 2015   Anemia    Anxiety    Breast cancer (HCC) 09/2019   left breast IMC   CAD (coronary artery disease)    a. 3/2007s/p DES to the LAD ([place] ); b. 01/2017 MV: EF 80%, small, mild apical ant defect w/o ischemia (felt to be breast atten). Low risk.   Depression    DM (diabetes mellitus) (HCC)    Emphysema lung (HCC)    Essential hypertension    GERD (gastroesophageal reflux disease)    Headache    secondary to neck surgery per patient   High triglycerides    HTN (hypertension)    Hyperlipidemia    Hypothyroid    OA (osteoarthritis)    right knee,hands   Overflow incontinence    PONV (postoperative nausea and vomiting)    s/p gallbladder    RLS (restless legs syndrome)    Urinary urgency    Uterine cancer (HCC) dx'd 2014   Past Surgical History:  Procedure Laterality Date   BREAST LUMPECTOMY WITH RADIOACTIVE SEED AND SENTINEL LYMPH NODE BIOPSY Left 10/07/2019   Procedure: LEFT BREAST LUMPECTOMY WITH RADIOACTIVE SEED AND SENTINEL LYMPH NODE BIOPSY;  Surgeon: Enid Harry, MD;  Location: [facility];  Service: General;  Laterality: Left;   CHOLECYSTECTOMY     CORONARY STENT PLACEMENT     CORONARY/GRAFT ACUTE MI REVASCULARIZATION     FINGER ARTHRODESIS Right 06/11/2019   Procedure: ARTHRODESIS INDEX FINGER DISTAL PHALANGEAL JOINT;  Surgeon: Brunilda Capra, MD;  Location: [facility];  Service: Orthopedics;  Laterality: Right;   FOOT SURGERY Left    LEFT HEART CATH AND CORONARY ANGIOGRAPHY N/A 02/16/2018   Procedure: LEFT HEART CATH AND CORONARY ANGIOGRAPHY;  Surgeon: Avanell Leigh, MD;  Location: [facility];  Service: Cardiovascular;  Laterality: N/A;   LEFT HEART CATH AND CORONARY ANGIOGRAPHY N/A 05/01/2020   Procedure: LEFT HEART CATH AND CORONARY ANGIOGRAPHY;  Surgeon: Avanell Leigh, MD;  Location: [facility];  Service: Cardiovascular;  Laterality: N/A;   PORTACATH PLACEMENT Right 11/23/2019   Procedure: INSERTION PORT-A-CATH WITH ULTRASOUND GUIDANCE;  Surgeon: Enid Harry, MD;  Location: [facility];  Service: General;  Laterality: Right;   REPLACEMENT TOTAL KNEE Right    SPINE SURGERY     TOTAL ABDOMINAL HYSTERECTOMY     FOR UTERUS CANCER   TOTAL HIP ARTHROPLASTY Left 2015   Family History  Problem Relation Age of Onset   AAA (abdominal aortic aneurysm) Mother    Thyroid  disease Mother    Colon cancer Mother    Diabetes Father    Alzheimer's disease Father    Arthritis Sister    Heart disease Sister    Throat cancer Sister    Kidney failure Sister    Diabetes Sister    Diabetes Brother    Diabetes Brother    Breast cancer Cousin    Social History   Socioeconomic History   Marital status: Divorced    Spouse name: Not on file   Number of children: 2   Years of education: Not on file   Highest education level: Not on file  Occupational History   Occupation: retired  Tobacco Use   Smoking status: Never   Smokeless tobacco: Never  Vaping Use   Vaping status: Never Used  Substance and Sexual  Activity   Alcohol use: Yes  Comment: OCCASIONALLY   Drug use: No   Sexual activity: Not Currently    Birth control/protection: Surgical    Comment: hyst  Other Topics Concern   Not on file  Social History Narrative   Originally from [place] , moved here in 2018.    Right handed   Drinks decaf   One floor house   Social Drivers of Health   Financial Resource Strain: Low Risk  (06/25/2023)   Overall Financial Resource Strain (CARDIA)    Difficulty of Paying Living Expenses: Not hard at all  Food Insecurity: No Food Insecurity (06/25/2023)   Hunger Vital Sign    Worried About Running Out of Food in the Last Year: Never true    Ran Out of Food in the Last Year: Never true  Transportation Needs: No Transportation Needs (06/25/2023)   PRAPARE - Administrator, Civil Service (Medical): No    Lack of Transportation (Non-Medical): No  Physical Activity: Inactive (06/25/2023)   Exercise Vital Sign    Days of Exercise per Week: 0 days    Minutes of Exercise per Session: 0 min  Stress: Stress Concern Present (06/25/2023)   [employer] of Occupational Health - Occupational Stress Questionnaire    Feeling of Stress : To some extent  Social Connections: Socially Isolated (06/25/2023)   Social Connection and Isolation Panel [NHANES]    Frequency of Communication with Friends and Family: Twice a week    Frequency of Social Gatherings with Friends and Family: Twice a week    Attends Religious Services: Never    Database administrator or Organizations: No    Attends Engineer, structural: Never    Marital Status: Divorced    Tobacco Counseling Counseling given: Not Answered    Clinical Intake:  Pre-visit preparation completed: Yes  Pain : No/denies pain Pain Score: 7  Pain Type: Chronic pain Pain Location: Generalized Pain Descriptors / Indicators: Aching Pain Onset: More than a month ago     BMI - recorded: 31.58 Nutritional Status: BMI > 30   Obese Diabetes: Yes CBG done?: No Did pt. bring in CBG monitor from home?: No  Lab Results  Component Value Date   HGBA1C 10.0 (A) 06/20/2023   HGBA1C 9.2 (A) 03/18/2023   HGBA1C 8.3 (A) 10/23/2022     How often do you need to have someone help you when you read instructions, pamphlets, or other written materials from your doctor or pharmacy?: 1 - Never  Interpreter Needed?: No  Information entered by :: Lamont Pilsner, LPN   Activities of Daily Living      No data to display          Patient Care Team: Allwardt, Deleta Felix, PA-C as PCP - General (Physician Assistant) Nahser, Lela Purple, MD as PCP - Cardiology (Cardiology) Enid Harry, MD as Consulting Physician (General Surgery) Colie Dawes, MD as Attending Physician (Radiation Oncology) Stefan Edge, MD as Consulting Physician (Rheumatology) Brunilda Capra, MD as Consulting Physician (Orthopedic Surgery) Alta Ast, MD as Consulting Physician (Endocrinology) Quillian Brunt, MD as Consulting Physician (Pulmonary Disease) Cameron Cea, MD as Consulting Physician (Hematology and Oncology) Myrle Aspen, [facility] (Inactive) as Pharmacist (Pharmacist)  I have updated your Care Teams any recent Medical Services you may have received from other providers in the past year.     Assessment:    This is a routine wellness examination for Palmer.  Hearing/Vision screen Hearing Screening - Comments:: Pt denies any hearing issues  Vision  Screening - Comments:: Wears rx glasses - up to date with routine eye exams with a new eye provider     Goals Addressed             This Visit's Progress    Patient Stated       None at this time        Depression Screen     06/25/2023    3:58 PM 03/04/2023   10:54 AM 12/31/2022    8:26 AM 10/03/2022    3:04 PM 07/23/2022    1:18 PM 06/06/2022   12:30 PM 04/11/2022    1:37 PM  PHQ 2/9 Scores  PHQ - 2 Score 2 1 0 2 0 6 2  PHQ- 9 Score 7  5  2 14 9     Fall Risk      06/25/2023    4:03 PM 03/24/2023    1:29 PM 03/04/2023   10:57 AM 01/03/2023    9:58 AM 12/31/2022    8:26 AM  Fall Risk   Falls in the past year? 1 1 1 1 1   Number falls in past yr: 1 0 0 0 0  Injury with Fall? 1 1 1 1 1   Risk for fall due to : Impaired balance/gait;Impaired mobility;History of fall(s)   History of fall(s) History of fall(s)  Follow up Falls prevention discussed Falls evaluation completed  Falls evaluation completed;Falls prevention discussed Falls evaluation completed    MEDICARE RISK AT HOME:  Medicare Risk at Home Any stairs in or around the home?: No If so, are there any without handrails?: No Home free of loose throw rugs in walkways, pet beds, electrical cords, etc?: Yes Adequate lighting in your home to reduce risk of falls?: Yes Life alert?: Yes Use of a cane, walker or w/c?: Yes Grab bars in the bathroom?: Yes Shower chair or bench in shower?: Yes Elevated toilet seat or a handicapped toilet?: Yes  TIMED UP AND GO:  Was the test performed?  No  Cognitive Function: 6CIT completed        06/25/2023    4:04 PM 04/11/2022    1:48 PM 04/06/2021    1:31 PM  6CIT Screen  What Year? 0 points 0 points 0 points  What month? 0 points 0 points 0 points  What time? 0 points 0 points 0 points  Count back from 20 0 points 0 points 0 points  Months in reverse 0 points 0 points 0 points  Repeat phrase 0 points 0 points 0 points  Total Score 0 points 0 points 0 points    Immunizations Immunization History  Administered Date(s) Administered   Fluad Quad(high Dose 65+) 09/25/2021   Fluad Trivalent(High Dose 65+) 10/23/2022   Influenza, High Dose Seasonal PF 12/31/2016, 10/24/2017   PNEUMOCOCCAL CONJUGATE-20 09/25/2021   Pneumococcal Conjugate-13 10/24/2017   Td 07/12/2005, 02/08/2019    Screening Tests Health Maintenance  Topic Date Due   Zoster Vaccines- Shingrix (1 of 2) Never done   OPHTHALMOLOGY EXAM  04/24/2023   Colonoscopy  10/06/2023    Medicare Annual Wellness (AWV)  07/20/2023 (Originally 04/11/2023)   INFLUENZA VACCINE  08/15/2023   HEMOGLOBIN A1C  09/20/2023   FOOT EXAM  10/23/2023   Diabetic kidney evaluation - Urine ACR  03/17/2024   Diabetic kidney evaluation - eGFR measurement  06/19/2024   MAMMOGRAM  08/25/2024   DTaP/Tdap/Td (3 - Tdap) 02/07/2029   Pneumonia Vaccine 64+ Years old  Completed   DEXA SCAN  Completed  Hepatitis C Screening  Completed   HPV VACCINES  Aged Out   Meningococcal B Vaccine  Aged Out   COVID-19 Vaccine  Discontinued    Health Maintenance  Health Maintenance Due  Topic Date Due   Zoster Vaccines- Shingrix (1 of 2) Never done   OPHTHALMOLOGY EXAM  04/24/2023   Colonoscopy  10/06/2023   Health Maintenance Items Addressed: See Nurse Notes at the end of this note  Additional Screening:  Vision Screening: Recommended annual ophthalmology exams for early detection of glaucoma and other disorders of the eye. Would you like a referral to an eye doctor? No    Dental Screening: Recommended annual dental exams for proper oral hygiene  Community Resource Referral / Chronic Care Management: CRR required this visit?  No   CCM required this visit?  No   Plan:    I have personally reviewed and noted the following in the patient's chart:   Medical and social history Use of alcohol, tobacco or illicit drugs  Current medications and supplements including opioid prescriptions. Patient is currently taking opioid prescriptions. Information provided to patient regarding non-opioid alternatives. Patient advised to discuss non-opioid treatment plan with their provider. Functional ability and status Nutritional status Physical activity Advanced directives List of other physicians Hospitalizations, surgeries, and ER visits in previous 12 months Vitals Screenings to include cognitive, depression, and falls Referrals and appointments  In addition, I have reviewed and discussed with  patient certain preventive protocols, quality metrics, and best practice recommendations. A written personalized care plan for preventive services as well as general preventive health recommendations were provided to patient.   Bruno Capri, LPN   09/28/7827   After Visit Summary: (MyChart) Due to this being a telephonic visit, the after visit summary with patients personalized plan was offered to patient via MyChart   Notes: Nothing significant to report at this time.

## 2023-06-26 NOTE — Progress Notes (Signed)
 Care Guide Pharmacy Note  06/26/2023 Name: Felicia [name] MRN: [id number] DOB: 1950-12-06  Referred By: Alda Amas, PA-C Reason for referral: Complex Care Management (Outreach to schedule referral with pharmacist ) and Call Attempt #1   Felicia [name] is a 73 y.o. year old female who is a primary care patient of Allwardt, Alyssa M, PA-C.  Felicia [name] was referred to the pharmacist for assistance related to: DMII  Successful contact was made with the patient to discuss pharmacy services including being ready for the pharmacist to call at least 5 minutes before the scheduled appointment time and to have medication bottles and any blood pressure readings ready for review. The patient agreed to meet with the pharmacist via telephone visit on 07/03/2023  Felicia [name], CMA [place]  [facility], [facility] Guide Direct Dial: [phone]  Fax: [phone] Website: [email or web address]

## 2023-06-30 ENCOUNTER — Other Ambulatory Visit: Payer: Self-pay | Admitting: Physician Assistant

## 2023-06-30 ENCOUNTER — Ambulatory Visit

## 2023-06-30 DIAGNOSIS — R279 Unspecified lack of coordination: Secondary | ICD-10-CM | POA: Diagnosis not present

## 2023-06-30 DIAGNOSIS — R531 Weakness: Secondary | ICD-10-CM

## 2023-06-30 DIAGNOSIS — M62838 Other muscle spasm: Secondary | ICD-10-CM | POA: Diagnosis not present

## 2023-06-30 DIAGNOSIS — R293 Abnormal posture: Secondary | ICD-10-CM | POA: Diagnosis not present

## 2023-06-30 DIAGNOSIS — M6281 Muscle weakness (generalized): Secondary | ICD-10-CM | POA: Diagnosis not present

## 2023-06-30 DIAGNOSIS — R296 Repeated falls: Secondary | ICD-10-CM

## 2023-06-30 DIAGNOSIS — E1165 Type 2 diabetes mellitus with hyperglycemia: Secondary | ICD-10-CM

## 2023-06-30 DIAGNOSIS — Z9189 Other specified personal risk factors, not elsewhere classified: Secondary | ICD-10-CM

## 2023-06-30 DIAGNOSIS — R42 Dizziness and giddiness: Secondary | ICD-10-CM

## 2023-06-30 NOTE — Therapy (Signed)
 OUTPATIENT PHYSICAL THERAPY FEMALE PELVIC TREATMENT   Patient Name: Felicia [name] MRN: [id number] DOB:1951/01/13, 73 y.o., female Today's Date: 06/30/2023  END OF SESSION:  PT End of Session - 06/30/23 1449     Visit Number 3    Date for PT Re-Evaluation 07/16/23    Authorization Type [employer] Medicare dual complete    PT Start Time 1445    PT Stop Time 1525    PT Time Calculation (min) 40 min    Activity Tolerance Patient tolerated treatment well    Behavior During Therapy WFL for tasks assessed/performed           Past Medical History:  Diagnosis Date   Adenomatous colon polyp 2015   Anemia    Anxiety    Breast cancer (HCC) 09/2019   left breast IMC   CAD (coronary artery disease)    a. 3/2007s/p DES to the LAD ([place] ); b. 01/2017 MV: EF 80%, small, mild apical ant defect w/o ischemia (felt to be breast atten). Low risk.   Depression    DM (diabetes mellitus) (HCC)    Emphysema lung (HCC)    Essential hypertension    GERD (gastroesophageal reflux disease)    Headache    secondary to neck surgery per patient   High triglycerides    HTN (hypertension)    Hyperlipidemia    Hypothyroid    OA (osteoarthritis)    right knee,hands   Overflow incontinence    PONV (postoperative nausea and vomiting)    s/p gallbladder    RLS (restless legs syndrome)    Urinary urgency    Uterine cancer (HCC) dx'd 2014   Past Surgical History:  Procedure Laterality Date   BREAST LUMPECTOMY WITH RADIOACTIVE SEED AND SENTINEL LYMPH NODE BIOPSY Left 10/07/2019   Procedure: LEFT BREAST LUMPECTOMY WITH RADIOACTIVE SEED AND SENTINEL LYMPH NODE BIOPSY;  Surgeon: Enid Harry, MD;  Location: [facility];  Service: General;  Laterality: Left;   CHOLECYSTECTOMY     CORONARY STENT PLACEMENT     CORONARY/GRAFT ACUTE MI REVASCULARIZATION     FINGER ARTHRODESIS Right 06/11/2019   Procedure: ARTHRODESIS INDEX FINGER DISTAL PHALANGEAL JOINT;  Surgeon: Brunilda Capra, MD;   Location: [facility];  Service: Orthopedics;  Laterality: Right;   FOOT SURGERY Left    LEFT HEART CATH AND CORONARY ANGIOGRAPHY N/A 02/16/2018   Procedure: LEFT HEART CATH AND CORONARY ANGIOGRAPHY;  Surgeon: Avanell Leigh, MD;  Location: [facility];  Service: Cardiovascular;  Laterality: N/A;   LEFT HEART CATH AND CORONARY ANGIOGRAPHY N/A 05/01/2020   Procedure: LEFT HEART CATH AND CORONARY ANGIOGRAPHY;  Surgeon: Avanell Leigh, MD;  Location: [facility];  Service: Cardiovascular;  Laterality: N/A;   PORTACATH PLACEMENT Right 11/23/2019   Procedure: INSERTION PORT-A-CATH WITH ULTRASOUND GUIDANCE;  Surgeon: Enid Harry, MD;  Location: [facility];  Service: General;  Laterality: Right;   REPLACEMENT TOTAL KNEE Right    SPINE SURGERY     TOTAL ABDOMINAL HYSTERECTOMY     FOR UTERUS CANCER   TOTAL HIP ARTHROPLASTY Left 2015   Patient Active Problem List   Diagnosis Date Noted   Disorder associated with type 2 diabetes mellitus (HCC) 06/20/2023   Restless leg syndrome 05/26/2023   Referred otalgia of left ear 11/19/2022   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   Erosive osteoarthritis of multiple sites 09/26/2021   Frequent headaches 05/31/2021   Bilateral impacted cerumen 02/22/2021   Nasal  septal perforation 02/22/2021   Postnasal drip 02/22/2021   Hoarseness 01/25/2021   Hyperglycemia due to type 2 diabetes mellitus (HCC) 01/18/2021   Mixed simple and mucopurulent chronic bronchitis (HCC) 12/27/2020   Centrilobular emphysema (HCC) 07/31/2020   Shortness of breath 07/31/2020   Tongue lesion 04/30/2020   Hypothyroid    Anxiety    Anxiety and depression    Port-A-Cath in place 11/24/2019   Hepatic steatosis 09/28/2019   Malignant neoplasm of upper-outer quadrant of left breast in female, estrogen receptor positive (HCC) 09/02/2019   Trigger index finger of right hand 05/05/2019   Primary  osteoarthritis of first carpometacarpal joint of right hand 05/05/2019   Osteoarthritis of finger of right hand 05/05/2019   Endometrial cancer (HCC) 10/23/2018   Cirrhosis of liver without ascites (HCC) 10/23/2018   Diarrhea 10/23/2018   Chest pain 02/15/2018   Coronary artery disease involving native coronary artery of native heart without angina pectoris 11/18/2017   Hyperlipidemia LDL goal <70 11/18/2017   Diabetes mellitus treated with insulin  and oral medication (HCC) 11/18/2017   Dizziness 11/18/2017   Type 2 diabetes mellitus without complication, without long-term current use of insulin  (HCC) 11/18/2017    PCP: Alda Amas, PA-C  REFERRING PROVIDER: Edmonia Gottron, PA-C   REFERRING DIAG: K52.9 (ICD-10-CM) - Chronic diarrhea  THERAPY DIAG:  Muscle weakness (generalized)  Unspecified lack of coordination  Abnormal posture  Other muscle spasm  Rationale for Evaluation and Treatment: Rehabilitation  ONSET DATE: 3 years ago  SUBJECTIVE:                                                                                                                                                                                           SUBJECTIVE STATEMENT: Pt states that pain is getting much worse. She feels like her Rt LE is giving out under her. She has not fallen again, but feels like she has come close. She had x-ray of Rt hip that did not show anything.   PAIN: 06/30/23 Are you having pain? Yes NPRS scale: 8/10 Pain location: hips, low back, LE  Pain type: aching Pain description: constant   Aggravating factors: walking, standing  Relieving factors: lying down   PRECAUTIONS: Other: hx of uterine and breast cancer   RED FLAGS: None   WEIGHT BEARING RESTRICTIONS: No  FALLS:  Has patient fallen in last 6 months? Yes. 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, anastrozole , CAD with stent 2018, emphysema, cirrhosis, type 2 diabetes, cholecystectomy, Rt TKA, Lt THA, total abdominal hysterectomy (from cancer)   BOWEL MOVEMENT: Pain with bowel movement: No - states that she hardly feels it Type of bowel movement:Type (Bristol Stool Scale) 5-7, Frequency many times a day, usually small amounts every time she urinates, and Strain no - just comes out Fully empty rectum: No Leakage: Yes: unaware, with passing gas Pads: Yes: 3 briefs on bad days (day and night) Fiber supplement/laxative Yes and Miralax   URINATION: Pain with urination: No Fully empty bladder: Yes:   Stream: Strong Urgency: Yes  Frequency: over 3 or 4 hours; no nocturia Leakage: Urge to void, Walking to the bathroom, and standing up Pads: Yes: 3 briefs a day/night  INTERCOURSE:  Nor sexually active and no history of pain  PREGNANCY: Vaginal deliveries 1 Tearing No Episiotomy No C-section deliveries 1 Currently pregnant No  PROLAPSE: None   OBJECTIVE:  Note: Objective measures were completed at Evaluation unless otherwise noted.  04/23/23: PATIENT SURVEYS:   PFIQ-7: 52  COGNITION: Overall cognitive status: Within functional limits for tasks assessed     SENSATION: Light touch: Appears intact  FUNCTIONAL TESTS:  Squat: bil valgus knee collapse Single leg stance:  Rt: compensated trendelenburg with UE support  Lt: pelvic drop with UE support Curl-up test: significant abdominal distortion    GAIT: Assistive device utilized: None Comments: Rt antalgic pattern, decreased bil hip extension  POSTURE: rounded shoulders, forward head, decreased lumbar lordosis, increased thoracic kyphosis, and posterior pelvic tilt   LUMBARAROM/PROM:  A/PROM A/PROM  Eval (% available)  Flexion 75   Extension 50  Right lateral flexion 50  Left lateral flexion 50  Right rotation 50  Left rotation 50   (Blank rows = not tested)   PALPATION:   General: significant tightness in bil obliques and lumbar paraspinals   Abdominal: abdominal distention and tightness; scar tissue                  External Perineal Exam: dry                             Internal Pelvic Floor: vaginal stenosis, scar tissue restriction, decreased mobility; decreased rectal sensation; decreased ability to create appropriate intra-rectal pressure when attempting to push like during bowel movement   Patient confirms identification and approves PT to assess internal pelvic floor and treatment Yes  PELVIC MMT:   MMT eval  Vaginal No active contraction  Internal Anal Sphincter No active contraction  External Anal Sphincter 1/5  Puborectalis 1/5  Diastasis Recti 4 finger widths   (Blank rows = not tested)        TONE: low  PROLAPSE: None detected, but difficulty with coordination of appropriate bearing down   TODAY'S TREATMENT:  DATE:  06/30/23 Manual: In Lt side lying, Rt glute trigger point release and soft tissue mobilization  Neuromuscular re-education: Bridge with hip adduction, transversus abdominus, and pelvic floor muscle 2 x 10 Supine hip abduction red band 2 x 10 Exercises: Open books 6x Lower trunk rotation 2 x 10 Single knee to chest 5x  06/19/23 Neuromuscular re-education: Pt provides verbal consent for internal vaginal/rectal pelvic floor exam. Internal rectal manual biofeedback in side lying Quick flicks Bulge/push training Seated hip adduction ball press with transversus abdominus and pelvic floor muscle 2 x 10 Seated hip abduction red band with transversus abdominus and pelvic floor muscle 2 x 10 Seated resisted march red band with transversus abdominus  and pelvic floor muscle 2 x 10   04/23/23  EVAL  Neuromuscular re-education: Pt provides verbal consent for internal vaginal/rectal pelvic floor exam. Internal vaginal and rectal pelvic floor muscle contraction training Quick flicks   PATIENT EDUCATION:  Education details: See above Person educated: Patient Education method: Programmer, multimedia, Demonstration, Tactile cues, Verbal cues, and Handouts Education comprehension: verbalized understanding  HOME EXERCISE PROGRAM: [id number]  ASSESSMENT:  CLINICAL IMPRESSION: Pt having a lot of Rt hip and LE pain today that is impacting her gait and makes her feel like she is going to fall. She has been working on some of her HEP when she feels up to it. Due to this, we discussed relationship between pelvic floor muscles and restriction in low back/hips. We performed manual trigger point release and soft tissue mobilization to Rt hip with good release of tension and some of the pain. We discussed the benefit of dry needling and can consider in future treatment sessions so she can plan financially for it. She tolerated all exercises well. She will continue to benefit from skilled PT intervention in order to improve fecal and urinary incontinence, begin/progress functional strengthening program, and improve quality of life.   OBJECTIVE IMPAIRMENTS: decreased activity tolerance, decreased coordination, decreased endurance, decreased mobility, decreased ROM, decreased strength, increased fascial restrictions, increased muscle spasms, impaired tone, postural dysfunction, and pain.   ACTIVITY LIMITATIONS: continence  PARTICIPATION LIMITATIONS: community activity  PERSONAL FACTORS: 3+ comorbidities: medical history are also affecting patient's functional outcome.   REHAB POTENTIAL: Good  CLINICAL DECISION MAKING: Evolving/moderate complexity  EVALUATION COMPLEXITY: Moderate   GOALS: Goals reviewed with patient? Yes  SHORT TERM GOALS: Updated  06/19/23   Pt will be independent with HEP.   Baseline: Goal status: IN PROGRESS 06/19/23  2.  Pt will report 25% improvement in fecal incontinence.  Baseline:  Goal status: IN PROGRESS 06/19/23  3.  Pt will be able to teach back and utilize urge suppression technique in order to help reduce number of trips to the bathroom.    Baseline:  Goal status: IN PROGRESS 06/19/23  4.  Pt will decrease number of bowel movements to no more than 3 a day in order to decrease skin irritation and demonstrate more complete emptying.  Baseline:  Goal status: IN PROGRESS 06/19/23  5.  Pt will be independent with use of squatty potty, relaxed toileting mechanics, and improved bowel movement techniques in order to increase ease of bowel movements and complete evacuation so she can decrease leaking.   Baseline:  Goal status: IN PROGRESS 06/19/23   LONG TERM GOALS: Updated 06/19/23  Pt will be independent with advanced HEP.   Baseline:  Goal status: IN PROGRESS 06/19/23  2.  Pt will report 75% improvement in fecal incontinence.  Baseline:  Goal status: IN PROGRESS 06/19/23  3.  Pt will be able to pass gas without fecal incontinence in order to decrease number of briefs she is using daily.  Baseline:  Goal status: IN PROGRESS 06/19/23  4.  Pt will report no more than 1 brief a day in order to decrease amount she is having to buy and demonstrate improved bowel control.  Baseline:  Goal status: IN PROGRESS 06/19/23  5.  Pt will demonstrate normal pelvic floor muscle tone and A/ROM, able to achieve 4/5 strength with contractions and 10 sec endurance, in order to provide appropriate lumbopelvic support in functional activities.   Baseline:  Goal status: IN PROGRESS 06/19/23  6.  Pt will be able to go 2-3 hours in between voids without urgency or incontinence in order to improve QOL and perform all functional activities with less difficulty.   Baseline:  Goal status: IN PROGRESS 06/19/23  PLAN:  PT FREQUENCY:  1-2x/week  PT DURATION: 12 weeks  PLANNED INTERVENTIONS: 97110-Therapeutic exercises, 97530- Therapeutic activity, 97112- Neuromuscular re-education, 97535- Self Care, [id number]- Manual therapy, Dry Needling, and Biofeedback  PLAN FOR NEXT SESSION: Return to internal rectal exam to work on biofeedback of pelvic floor muscle contraction; biofeedback; hip/core exercises.    Verlena Glenn, PT, DPT06/16/253:19 PM

## 2023-07-01 ENCOUNTER — Ambulatory Visit (INDEPENDENT_AMBULATORY_CARE_PROVIDER_SITE_OTHER)

## 2023-07-01 VITALS — Ht 64.0 in | Wt 184.0 lb

## 2023-07-01 DIAGNOSIS — Z0000 Encounter for general adult medical examination without abnormal findings: Secondary | ICD-10-CM

## 2023-07-01 NOTE — Patient Instructions (Signed)
 Felicia [name] , Thank you for taking time out of your busy schedule to complete your Annual Wellness Visit with me. I enjoyed our conversation and look forward to speaking with you again next year. I, as well as your care team,  appreciate your ongoing commitment to your health goals. Please review the following plan we discussed and let me know if I can assist you in the future. Your Game plan/ To Do List    Referrals: If you haven't heard from the office you've been referred to, please reach out to them at the phone provided.   Follow up Visits: Next Medicare AWV with our clinical staff: 07/05/24   Have you seen your provider in the last 6 months (3 months if uncontrolled diabetes)? Yes Next Office Visit with your provider: 07/08/23  Clinician Recommendations: Each day, aim for 6 glasses of water, plenty of protein in your diet and try to get up and walk/ stretch every hour for 5-10 minutes at a time.        This is a list of the screening recommended for you and due dates:  Health Maintenance  Topic Date Due   Zoster (Shingles) Vaccine (1 of 2) Never done   Eye exam for diabetics  04/24/2023   Colon Cancer Screening  10/06/2023   Flu Shot  08/15/2023   Hemoglobin A1C  09/20/2023   Complete foot exam   10/23/2023   Yearly kidney health urinalysis for diabetes  03/17/2024   Yearly kidney function blood test for diabetes  06/19/2024   Medicare Annual Wellness Visit  06/30/2024   Mammogram  08/25/2024   DTaP/Tdap/Td vaccine (3 - Tdap) 02/07/2029   Pneumococcal Vaccine for age over 26  Completed   DEXA scan (bone density measurement)  Completed   Hepatitis C Screening  Completed   HPV Vaccine  Aged Out   Meningitis B Vaccine  Aged Out   COVID-19 Vaccine  Discontinued    Advanced directives: (Declined) Advance directive discussed with you today. Even though you declined this today, please call our office should you change your mind, and we can give you the proper paperwork for you to fill  out. Advance Care Planning is important because it:  [x]  Makes sure you receive the medical care that is consistent with your values, goals, and preferences  [x]  It provides guidance to your family and loved ones and reduces their decisional burden about whether or not they are making the right decisions based on your wishes.  Follow the link provided in your after visit summary or read over the paperwork we have mailed to you to help you started getting your Advance Directives in place. If you need assistance in completing these, please reach out to us  so that we can help you!  See attachments for Preventive Care and Fall Prevention Tips.

## 2023-07-01 NOTE — Progress Notes (Signed)
 Subjective:   Felicia [name] is a 73 y.o. who presents for a Medicare Wellness preventive visit.  As a reminder, Annual Wellness Visits don't include a physical exam, and some assessments may be limited, especially if this visit is performed virtually. We may recommend an in-person follow-up visit with your provider if needed.  Visit Complete: Virtual I connected with  Felicia [name] on 07/01/23 by a audio enabled telemedicine application and verified that I am speaking with the correct person using two identifiers.  Patient Location: Home  Provider Location: Office/Clinic  I discussed the limitations of evaluation and management by telemedicine. The patient expressed understanding and agreed to proceed.  Vital Signs: Because this visit was a virtual/telehealth visit, some criteria may be missing or patient reported. Any vitals not documented were not able to be obtained and vitals that have been documented are patient reported.  VideoDeclined- This patient declined Librarian, academic. Therefore the visit was completed with audio only.  Persons Participating in Visit: Patient.  AWV Questionnaire: No: Patient Medicare AWV questionnaire was not completed prior to this visit.  Cardiac Risk Factors include: advanced age (>42men, >59 women);dyslipidemia;diabetes mellitus;obesity (BMI >30kg/m2)     Objective:    Today's Vitals   07/01/23 1257 07/01/23 1304  Weight: 184 lb (83.5 kg)   Height: 5' 4 (1.626 m)   PainSc:  7    Body mass index is 31.58 kg/m.     07/01/2023    1:18 PM 06/25/2023    4:02 PM 04/23/2023    2:08 PM 03/24/2023    1:29 PM 10/03/2022    3:05 PM 09/24/2022    1:53 PM 08/05/2022   12:31 PM  Advanced Directives  Does Patient Have a Medical Advance Directive? No No No No No No No  Would patient like information on creating a medical advance directive? No - Patient declined No - Patient declined No - Patient declined  Yes  (MAU/Ambulatory/Procedural Areas - Information given)  No - Patient declined    Current Medications (verified) Outpatient Encounter Medications as of 07/01/2023  Medication Sig   ACCU-CHEK GUIDE test strip USE TO check blood glucose UP TO four times daily AS DIRECTED   Accu-Chek Softclix Lancets lancets USE TO check blood glucose UP TO four times daily AS DIRECTED   albuterol  (VENTOLIN  HFA) 108 (90 Base) MCG/ACT inhaler Inhale 2 puffs into the lungs every 6 (six) hours as needed for wheezing or shortness of breath.   anastrozole  (ARIMIDEX ) 1 MG tablet Take 1 tablet (1 mg total) by mouth daily.   Ascorbic Acid (VITAMIN C PO) Take 1 tablet by mouth daily.   aspirin  EC (ASPIRIN  81) 81 MG tablet Take 1 tablet (81 mg total) by mouth in the morning.   azelastine  (ASTELIN ) 0.1 % nasal spray Place 2 sprays into both nostrils 2 (two) times daily. Use in each nostril as directed   bisoprolol  (ZEBETA ) 5 MG tablet Take 1 tablet (5 mg total) by mouth at bedtime.   blood glucose meter kit and supplies Dispense based on patient and insurance preference. Use up to four times daily as directed. (FOR ICD-10 E10.9, E11.9).   buPROPion  (WELLBUTRIN  XL) 150 MG 24 hr tablet Take 1 tablet (150 mg total) by mouth every morning for depression.   celecoxib  (CELEBREX ) 200 MG capsule Take 1 capsule (200 mg total) by mouth 2 (two) times daily.   CINNAMON PO Take 1 capsule by mouth daily with supper.   clotrimazole -betamethasone  (LOTRISONE) cream Apply 1  Application topically 2 (two) times daily.   diclofenac  sodium (VOLTAREN ) 1 % GEL Apply 2-4 g topically 4 (four) times daily as needed (as directed for pain).   empagliflozin  (JARDIANCE ) 25 MG TABS tablet Take 1 tablet (25 mg total) by mouth daily.   escitalopram  (LEXAPRO ) 20 MG tablet Take 1 tablet (20 mg total) by mouth daily.   fenofibrate  (TRICOR ) 145 MG tablet Take 1 tablet (145 mg total) by mouth at bedtime.   Ferrous Sulfate (IRON PO) Take 1 tablet by mouth daily.  alternates days:1 tablet one day and 2 tablets the next day   gabapentin  (NEURONTIN ) 600 MG tablet Take 1 tablet (600 mg total) by mouth 3 (three) times daily.   glipiZIDE  (GLUCOTROL  XL) 10 MG 24 hr tablet Take 2 tablets (20 mg total) by mouth every morning.   glucose blood test strip Use to check blood sugar up to four times daily as directed   HYDROcodone -acetaminophen  (NORCO/VICODIN) 5-325 MG tablet Take 1 tablet by mouth every 6 (six) hours as needed.   Incontinence Supply Disposable (DEPEND ADJUSTABLE UNDERWEAR) MISC 1 Application by Does not apply route daily.   insulin  glargine (LANTUS  SOLOSTAR) 100 UNIT/ML Solostar Pen Start with injecting 4 units under the skin in the evenings. Increase by 2 units every 2 days until monring fasting glucose is to goal (140). Max units 60 per day.   Insulin  Pen Needle (INSUPEN PEN NEEDLES) 32G X 4 MM MISC Use to inject insulin  as directed   Insulin  Pen Needle (PEN NEEDLES) 32G X 4 MM MISC USE TO INJECT INSULIN    Lancets MISC Use to check blood sugar up to four times daily as directed   levothyroxine  (SYNTHROID ) 125 MCG tablet Take 1 tablet (125 mcg total) by mouth daily before breakfast.   loratadine  (ALLERGY RELIEF) 10 MG tablet Take 1 tablet (10 mg total) by mouth every evening.   metroNIDAZOLE  (METROGEL ) 1 % gel Apply 1 application topically as needed (as directed to affected area).   Multiple Vitamins-Calcium  (ONE-A-DAY WOMENS PO) Take 1 tablet by mouth daily.   mupirocin  cream (BACTROBAN ) 2 % Apply 1 Application topically 3 (three) times daily.   nitroGLYCERIN  (NITROSTAT ) 0.4 MG SL tablet Dissolve 1 tablet under the tongue every 5 minutes as needed for chest pain. Max of 3 doses, then 911.   omeprazole  (PRILOSEC) 40 MG capsule Take 1 capsule (40 mg total) by mouth every evening.   polyethylene glycol powder (MIRALAX ) 17 GM/SCOOP powder Take 17 g by mouth daily.   rOPINIRole  (REQUIP ) 1 MG tablet Take 1 tablet (1 mg total) by mouth at bedtime.    Tiotropium Bromide-Olodaterol (STIOLTO RESPIMAT ) 2.5-2.5 MCG/ACT AERS Inhale 2 puffs into the lungs daily.   tiZANidine  (ZANAFLEX ) 2 MG tablet Take 1 tablet (2 mg total) by mouth 3 (three) times daily.   traMADol  (ULTRAM ) 50 MG tablet Take 1 tablet (50 mg total) by mouth every 6 (six) hours as needed for moderate pain (pain score 4-6) or severe pain (pain score 7-10).   triamcinolone  (KENALOG ) 0.1 % paste Apply a small amount to affected area twice daily for 2 - 3 weeks.   TURMERIC PO Take 1 capsule by mouth daily with supper.   Wheat Dextrin (BENEFIBER) POWD 1-2 TBSP work up to 3 x a day, mix in water or liquid   No facility-administered encounter medications on file as of 07/01/2023.    Allergies (verified) Chloraprep one step [chlorhexidine  gluconate], Estrogens, Shellfish-derived products, and Shrimp [shellfish allergy]   History: Past Medical  History:  Diagnosis Date   Adenomatous colon polyp 2015   Anemia    Anxiety    Breast cancer (HCC) 09/2019   left breast IMC   CAD (coronary artery disease)    a. 3/2007s/p DES to the LAD ([place] ); b. 01/2017 MV: EF 80%, small, mild apical ant defect w/o ischemia (felt to be breast atten). Low risk.   Depression    DM (diabetes mellitus) (HCC)    Emphysema lung (HCC)    Essential hypertension    GERD (gastroesophageal reflux disease)    Headache    secondary to neck surgery per patient   High triglycerides    HTN (hypertension)    Hyperlipidemia    Hypothyroid    OA (osteoarthritis)    right knee,hands   Overflow incontinence    PONV (postoperative nausea and vomiting)    s/p gallbladder    RLS (restless legs syndrome)    Urinary urgency    Uterine cancer (HCC) dx'd 2014   Past Surgical History:  Procedure Laterality Date   BREAST LUMPECTOMY WITH RADIOACTIVE SEED AND SENTINEL LYMPH NODE BIOPSY Left 10/07/2019   Procedure: LEFT BREAST LUMPECTOMY WITH RADIOACTIVE SEED AND SENTINEL LYMPH NODE BIOPSY;  Surgeon: Enid Harry, MD;  Location: [facility];  Service: General;  Laterality: Left;   CHOLECYSTECTOMY     CORONARY STENT PLACEMENT     CORONARY/GRAFT ACUTE MI REVASCULARIZATION     FINGER ARTHRODESIS Right 06/11/2019   Procedure: ARTHRODESIS INDEX FINGER DISTAL PHALANGEAL JOINT;  Surgeon: Brunilda Capra, MD;  Location: [facility];  Service: Orthopedics;  Laterality: Right;   FOOT SURGERY Left    LEFT HEART CATH AND CORONARY ANGIOGRAPHY N/A 02/16/2018   Procedure: LEFT HEART CATH AND CORONARY ANGIOGRAPHY;  Surgeon: Avanell Leigh, MD;  Location: [facility];  Service: Cardiovascular;  Laterality: N/A;   LEFT HEART CATH AND CORONARY ANGIOGRAPHY N/A 05/01/2020   Procedure: LEFT HEART CATH AND CORONARY ANGIOGRAPHY;  Surgeon: Avanell Leigh, MD;  Location: [facility];  Service: Cardiovascular;  Laterality: N/A;   PORTACATH PLACEMENT Right 11/23/2019   Procedure: INSERTION PORT-A-CATH WITH ULTRASOUND GUIDANCE;  Surgeon: Enid Harry, MD;  Location: [facility];  Service: General;  Laterality: Right;   REPLACEMENT TOTAL KNEE Right    SPINE SURGERY     TOTAL ABDOMINAL HYSTERECTOMY     FOR UTERUS CANCER   TOTAL HIP ARTHROPLASTY Left 2015   Family History  Problem Relation Age of Onset   AAA (abdominal aortic aneurysm) Mother    Thyroid  disease Mother    Colon cancer Mother    Diabetes Father    Alzheimer's disease Father    Arthritis Sister    Heart disease Sister    Throat cancer Sister    Kidney failure Sister    Diabetes Sister    Diabetes Brother    Diabetes Brother    Breast cancer Cousin    Social History   Socioeconomic History   Marital status: Divorced    Spouse name: Not on file   Number of children: 2   Years of education: Not on file   Highest education level: Not on file  Occupational History   Occupation: retired  Tobacco Use   Smoking status: Never   Smokeless tobacco: Never  Vaping Use   Vaping status:  Never Used  Substance and Sexual Activity   Alcohol use: Yes    Comment: OCCASIONALLY   Drug use: No  Sexual activity: Not Currently    Birth control/protection: Surgical    Comment: hyst  Other Topics Concern   Not on file  Social History Narrative   Originally from [place] , moved here in 2018.    Right handed   Drinks decaf   One floor house   Social Drivers of Health   Financial Resource Strain: Low Risk  (07/01/2023)   Overall Financial Resource Strain (CARDIA)    Difficulty of Paying Living Expenses: Not hard at all  Food Insecurity: No Food Insecurity (07/01/2023)   Hunger Vital Sign    Worried About Running Out of Food in the Last Year: Never true    Ran Out of Food in the Last Year: Never true  Transportation Needs: No Transportation Needs (07/01/2023)   PRAPARE - Administrator, Civil Service (Medical): No    Lack of Transportation (Non-Medical): No  Physical Activity: Inactive (07/01/2023)   Exercise Vital Sign    Days of Exercise per Week: 0 days    Minutes of Exercise per Session: 0 min  Stress: Stress Concern Present (07/01/2023)   [employer] of Occupational Health - Occupational Stress Questionnaire    Feeling of Stress: To some extent  Social Connections: Socially Isolated (07/01/2023)   Social Connection and Isolation Panel    Frequency of Communication with Friends and Family: Twice a week    Frequency of Social Gatherings with Friends and Family: Twice a week    Attends Religious Services: Never    Database administrator or Organizations: No    Attends Engineer, structural: Never    Marital Status: Divorced    Tobacco Counseling Counseling given: Not Answered    Clinical Intake:  Pre-visit preparation completed: Yes  Pain : 0-10 Pain Score: 7  Pain Type: Chronic pain Pain Location: Generalized Pain Descriptors / Indicators: Aching Pain Onset: More than a month ago     BMI - recorded: 31.58 Nutritional  Status: BMI > 30  Obese Diabetes: Yes CBG done?: No Did pt. bring in CBG monitor from home?: No  Lab Results  Component Value Date   HGBA1C 10.0 (A) 06/20/2023   HGBA1C 9.2 (A) 03/18/2023   HGBA1C 8.3 (A) 10/23/2022     How often do you need to have someone help you when you read instructions, pamphlets, or other written materials from your doctor or pharmacy?: 1 - Never  Interpreter Needed?: No  Information entered by :: Lamont Pilsner, LPN   Activities of Daily Living     07/01/2023    1:07 PM  In your present state of health, do you have any difficulty performing the following activities:  Hearing? 0  Vision? 0  Difficulty concentrating or making decisions? 0  Walking or climbing stairs? 0  Dressing or bathing? 0  Doing errands, shopping? 0  Preparing Food and eating ? N  Using the Toilet? N  In the past six months, have you accidently leaked urine? Y  Comment wears brief  Do you have problems with loss of bowel control? Y  Managing your Medications? N  Managing your Finances? N  Housekeeping or managing your Housekeeping? N    Patient Care Team: Allwardt, Deleta Felix, PA-C as PCP - General (Physician Assistant) Nahser, Lela Purple, MD as PCP - Cardiology (Cardiology) Enid Harry, MD as Consulting Physician (General Surgery) Colie Dawes, MD as Attending Physician (Radiation Oncology) Stefan Edge, MD as Consulting Physician (Rheumatology) Brunilda Capra, MD as Consulting Physician (Orthopedic Surgery) Averneni,  Tennie Feil, MD as Consulting Physician (Endocrinology) Quillian Brunt, MD as Consulting Physician (Pulmonary Disease) Cameron Cea, MD as Consulting Physician (Hematology and Oncology) Myrle Aspen, [facility] (Inactive) as Pharmacist (Pharmacist)  I have updated your Care Teams any recent Medical Services you may have received from other providers in the past year.     Assessment:   This is a routine wellness examination for  [place].  Hearing/Vision screen Hearing Screening - Comments:: Pt denies any hearing issues  Vision Screening - Comments:: Wears rx glasses - up to date with routine eye exams with Pt stated she will be seeking a new providerfor this year     Goals Addressed             This Visit's Progress    Patient Stated       None tp set at this time        Depression Screen     07/01/2023    1:11 PM 06/25/2023    3:58 PM 03/04/2023   10:54 AM 12/31/2022    8:26 AM 10/03/2022    3:04 PM 07/23/2022    1:18 PM 06/06/2022   12:30 PM  PHQ 2/9 Scores  PHQ - 2 Score 2 2 1  0 2 0 6  PHQ- 9 Score 7 7  5  2 14     Fall Risk     07/01/2023    1:20 PM 06/25/2023    4:03 PM 03/24/2023    1:29 PM 03/04/2023   10:57 AM 01/03/2023    9:58 AM  Fall Risk   Falls in the past year? 1 1 1 1 1   Number falls in past yr: 1 1 0 0 0  Injury with Fall? 1 1 1 1 1   Risk for fall due to : Impaired balance/gait;Impaired mobility Impaired balance/gait;Impaired mobility;History of fall(s)   History of fall(s)  Follow up Falls prevention discussed Falls prevention discussed Falls evaluation completed  Falls evaluation completed;Falls prevention discussed    MEDICARE RISK AT HOME:  Medicare Risk at Home Any stairs in or around the home?: No If so, are there any without handrails?: No Home free of loose throw rugs in walkways, pet beds, electrical cords, etc?: Yes Adequate lighting in your home to reduce risk of falls?: Yes Life alert?: Yes Use of a cane, walker or w/c?: Yes Grab bars in the bathroom?: Yes Shower chair or bench in shower?: Yes Elevated toilet seat or a handicapped toilet?: Yes  TIMED UP AND GO:  Was the test performed?  No  Cognitive Function: 6CIT completed        07/01/2023    1:20 PM 06/25/2023    4:04 PM 04/11/2022    1:48 PM 04/06/2021    1:31 PM  6CIT Screen  What Year? 0 points 0 points 0 points 0 points  What month? 0 points 0 points 0 points 0 points  What time? 0 points 0  points 0 points 0 points  Count back from 20 0 points 0 points 0 points 0 points  Months in reverse 0 points 0 points 0 points 0 points  Repeat phrase 0 points 0 points 0 points 0 points  Total Score 0 points 0 points 0 points 0 points    Immunizations Immunization History  Administered Date(s) Administered   Fluad Quad(high Dose 65+) 09/25/2021   Fluad Trivalent(High Dose 65+) 10/23/2022   Influenza, High Dose Seasonal PF 12/31/2016, 10/24/2017   PNEUMOCOCCAL CONJUGATE-20 09/25/2021   Pneumococcal Conjugate-13 10/24/2017  Td 07/12/2005, 02/08/2019    Screening Tests Health Maintenance  Topic Date Due   Zoster Vaccines- Shingrix (1 of 2) Never done   OPHTHALMOLOGY EXAM  04/24/2023   Colonoscopy  10/06/2023   INFLUENZA VACCINE  08/15/2023   HEMOGLOBIN A1C  09/20/2023   FOOT EXAM  10/23/2023   Diabetic kidney evaluation - Urine ACR  03/17/2024   Diabetic kidney evaluation - eGFR measurement  06/19/2024   Medicare Annual Wellness (AWV)  06/30/2024   MAMMOGRAM  08/25/2024   DTaP/Tdap/Td (3 - Tdap) 02/07/2029   Pneumococcal Vaccine: 50+ Years  Completed   DEXA SCAN  Completed   Hepatitis C Screening  Completed   HPV VACCINES  Aged Out   Meningococcal B Vaccine  Aged Out   COVID-19 Vaccine  Discontinued    Health Maintenance  Health Maintenance Due  Topic Date Due   Zoster Vaccines- Shingrix (1 of 2) Never done   OPHTHALMOLOGY EXAM  04/24/2023   Colonoscopy  10/06/2023   Health Maintenance Items Addressed: See Nurse Notes at the end of this note  Additional Screening:  Vision Screening: Recommended annual ophthalmology exams for early detection of glaucoma and other disorders of the eye. Would you like a referral to an eye doctor? No    Dental Screening: Recommended annual dental exams for proper oral hygiene  Community Resource Referral / Chronic Care Management: CRR required this visit?  No   CCM required this visit?  No   Plan:    I have personally  reviewed and noted the following in the patient's chart:   Medical and social history Use of alcohol, tobacco or illicit drugs  Current medications and supplements including opioid prescriptions. Patient is currently taking opioid prescriptions. Information provided to patient regarding non-opioid alternatives. Patient advised to discuss non-opioid treatment plan with their provider. Functional ability and status Nutritional status Physical activity Advanced directives List of other physicians Hospitalizations, surgeries, and ER visits in previous 12 months Vitals Screenings to include cognitive, depression, and falls Referrals and appointments  In addition, I have reviewed and discussed with patient certain preventive protocols, quality metrics, and best practice recommendations. A written personalized care plan for preventive services as well as general preventive health recommendations were provided to patient.   Bruno Capri, LPN   5/36/6440   After Visit Summary: (MyChart) Due to this being a telephonic visit, the after visit summary with patients personalized plan was offered to patient via MyChart   Notes: PCP Follow Up Recommendations: Pt stated she has to see new eye provider , I saw referral  placed in march may need to follow up with pt to see  if she has scheduled appt  for eye exam , Pt has appt with you 07/08/23

## 2023-07-02 ENCOUNTER — Ambulatory Visit: Admitting: Family

## 2023-07-03 ENCOUNTER — Other Ambulatory Visit: Payer: Self-pay | Admitting: Pharmacist

## 2023-07-03 NOTE — Progress Notes (Unsigned)
   07/03/2023 Name: Felicia [name] MRN: [id number] DOB: December 11, 1950  Chief Complaint  Patient presents with   Diabetes   Medication Management    Felicia [name] is a 73 y.o. year old female who presented for a telephone visit.   They were referred to the pharmacist by their PCP for assistance in managing diabetes, complex medication management, and frequent falls.    Subjective:  Care Team: Primary Care Provider: Allwardt, Deleta Felix, PA-C ; Next Scheduled Visit: 07/08/2023 Endocrinologist Dr Aretha Kubas; Next Scheduled Visit: 10/14/2023 - initial visit Neurologist: Festus Hubert; Next Scheduled Visit: 09/29/2023  Medication Access/Adherence  Current Pharmacy:  [facility] [address] [place] [address] Phone: [phone] Fax: [phone]   Patient reports affordability concerns with their medications: {YES/NO:21197} Patient reports access/transportation concerns to their pharmacy: {YES/NO:21197} Patient reports adherence concerns with their medications:  {YES/NO:21197} ***   Diabetes:  Current medications:  Medications tried in the past:   Current glucose readings: *** Using *** meter; testing *** times daily  Date of Download: *** % Time CGM is active: ***% Average Glucose: *** mg/dL Glucose Management Indicator: ***  Glucose Variability: *** (goal <36%) Time in Goal:  - Time in range 70-180: ***% - Time above range: ***% - Time below range: ***% Observed patterns:  Patient {Actions; denies-reports:120008} hypoglycemic s/sx including ***dizziness, shakiness, sweating. Patient {Actions; denies-reports:120008} hyperglycemic symptoms including ***polyuria, polydipsia, polyphagia, nocturia, neuropathy, blurred vision.  Current meal patterns:  - Breakfast: *** - Lunch *** - Supper *** - Snacks *** - Drinks ***  Current physical activity: ***  Current medication access support: *** Hypertension:  Current medications:  *** Medications previously tried:   Patient {HAS/DOES NOT ONGE:95284} a validated, automated, upper arm home BP cuff Current blood pressure readings readings: ***  Patient {Actions; denies-reports:120008} hypotensive s/sx including ***dizziness, lightheadedness.  Patient {Actions; denies-reports:120008} hypertensive symptoms including ***headache, chest pain, shortness of breath  Current meal patterns: ***  Current physical activity: ***   ***   Objective:  BP Readings from Last 3 Encounters:  06/20/23 128/68  06/06/23 (!) 118/58  06/03/23 (!) 144/90     Lab Results  Component Value Date   HGBA1C 10.0 (A) 06/20/2023    Lab Results  Component Value Date   CREATININE 0.94 06/20/2023   BUN 23 06/20/2023   NA 137 06/20/2023   K 4.3 06/20/2023   CL 100 06/20/2023   CO2 30 06/20/2023    Lab Results  Component Value Date   CHOL 281 (H) 11/14/2022   HDL 46 11/14/2022   LDLCALC 161 (H) 11/14/2022   TRIG 389 (H) 11/14/2022   CHOLHDL 6.1 (H) 11/14/2022    Medications Reviewed Today   Medications were not reviewed in this encounter       Assessment/Plan:   {Pharmacy A/P Choices:26421}  Follow Up Plan: ***  ***

## 2023-07-07 ENCOUNTER — Ambulatory Visit

## 2023-07-07 ENCOUNTER — Other Ambulatory Visit (HOSPITAL_COMMUNITY): Payer: Self-pay

## 2023-07-07 ENCOUNTER — Other Ambulatory Visit: Payer: Self-pay

## 2023-07-07 DIAGNOSIS — M6281 Muscle weakness (generalized): Secondary | ICD-10-CM | POA: Diagnosis not present

## 2023-07-07 DIAGNOSIS — R279 Unspecified lack of coordination: Secondary | ICD-10-CM

## 2023-07-07 DIAGNOSIS — M62838 Other muscle spasm: Secondary | ICD-10-CM | POA: Diagnosis not present

## 2023-07-07 DIAGNOSIS — R293 Abnormal posture: Secondary | ICD-10-CM

## 2023-07-07 NOTE — Patient Instructions (Signed)
 Visit Information  Thank you for taking time to visit with me today. Please don't hesitate to contact me if I can be of assistance to you before our next scheduled telephone appointment.  Our next appointment is by telephone on 07/21/23 at 2pm  Following is a copy of your care plan:   Goals Addressed             This Visit's Progress    VBCI RN Care Plan       Problems:  Chronic Disease Management support and education needs related to DMII  Goal: Over the next 3 months the Patient will continue to work with RN Care Manager and/or Social Worker to address care management and care coordination needs related to DMII as evidenced by adherence to CM Team Scheduled appointments      Interventions:   Diabetes Interventions: Assessed patient's understanding of A1c goal: <7% Provided education to patient about basic DM disease process Reviewed medications with patient and discussed importance of medication adherence Counseled on importance of regular laboratory monitoring as prescribed Discussed plans with patient for ongoing care management follow up and provided patient with direct contact information for care management team Advised patient, providing education and rationale, to check cbg daily and record, calling PCP for findings outside established parameters Lab Results  Component Value Date   HGBA1C 9.2 (A) 03/18/2023    Patient Self-Care Activities:  Attend all scheduled provider appointments Call pharmacy for medication refills 3-7 days in advance of running out of medications Call provider office for new concerns or questions  Perform all self care activities independently  Perform IADL's (shopping, preparing meals, housekeeping, managing finances) independently Take medications as prescribed   check blood sugar at prescribed times: fasting AM blood sugar check feet daily for cuts, sores or redness enter blood sugar readings and medication or insulin  into daily log take  the blood sugar log to all doctor visits drink 6 to 8 glasses of water each day eat fish at least once per week fill half of plate with vegetables  Plan:  The patient has been provided with contact information for the care management team and has been advised to call with any health related questions or concerns.  Next appointment wit hRN Case manager Channing is on 07/21/23 @ 2pm             Patient verbalizes understanding of instructions and care plan provided today and agrees to view in MyChart. Active MyChart status and patient understanding of how to access instructions and care plan via MyChart confirmed with patient.     The patient has been provided with contact information for the care management team and has been advised to call with any health related questions or concerns.   Please call the care guide team at [phone] if you need to cancel or reschedule your appointment.   Please call [phone]-TALK (toll free, 24 hour hotline) if you are experiencing a Mental Health or Behavioral Health Crisis or need someone to talk to.  Jaquil Todt A. Gordy RN, BA, [facility], CRRN [place]  [facility] Population Health RN Care Manager Direct Dial: [phone]  Fax: [phone]

## 2023-07-07 NOTE — Therapy (Signed)
 OUTPATIENT PHYSICAL THERAPY FEMALE PELVIC TREATMENT   Patient Name: Felicia [name] MRN: [id number] DOB:[birth year], 73 y.o., female Today's Date: 07/07/2023  END OF SESSION:  PT End of Session - 07/07/23 1447     Visit Number 4    Date for PT Re-Evaluation 07/16/23    Authorization Type U[employer] Medicare dual complete    PT Start Time 1445    PT Stop Time 1528    PT Time Calculation (min) 43 min    Activity Tolerance Patient tolerated treatment well    Behavior During Therapy WFL for tasks assessed/performed           Past Medical History:  Diagnosis Date   Adenomatous colon polyp 2015   Anemia    Anxiety    Breast cancer (HCC) 09/2019   left breast IMC   CAD (coronary artery disease)    a. 3/2007s/p DES to the LAD ([place] ); b. 01/2017 MV: EF 80%, small, mild apical ant defect w/o ischemia (felt to be breast atten). Low risk.   Depression    DM (diabetes mellitus) (HCC)    Emphysema lung (HCC)    Essential hypertension    GERD (gastroesophageal reflux disease)    Headache    secondary to neck surgery per patient   High triglycerides    HTN (hypertension)    Hyperlipidemia    Hypothyroid    OA (osteoarthritis)    right knee,hands   Overflow incontinence    PONV (postoperative nausea and vomiting)    s/p gallbladder    RLS (restless legs syndrome)    Urinary urgency    Uterine cancer (HCC) dx'd 2014   Past Surgical History:  Procedure Laterality Date   BREAST LUMPECTOMY WITH RADIOACTIVE SEED AND SENTINEL LYMPH NODE BIOPSY Left 10/07/2019   Procedure: LEFT BREAST LUMPECTOMY WITH RADIOACTIVE SEED AND SENTINEL LYMPH NODE BIOPSY;  Surgeon: Ebbie Cough, MD;  Location: [facility];  Service: General;  Laterality: Left;   CHOLECYSTECTOMY     CORONARY STENT PLACEMENT     CORONARY/GRAFT ACUTE MI REVASCULARIZATION     FINGER ARTHRODESIS Right 06/11/2019   Procedure: ARTHRODESIS INDEX FINGER DISTAL PHALANGEAL JOINT;  Surgeon: Murrell Drivers, MD;   Location: [facility];  Service: Orthopedics;  Laterality: Right;   FOOT SURGERY Left    LEFT HEART CATH AND CORONARY ANGIOGRAPHY N/A 02/16/2018   Procedure: LEFT HEART CATH AND CORONARY ANGIOGRAPHY;  Surgeon: Court Dorn PARAS, MD;  Location: [facility];  Service: Cardiovascular;  Laterality: N/A;   LEFT HEART CATH AND CORONARY ANGIOGRAPHY N/A 05/01/2020   Procedure: LEFT HEART CATH AND CORONARY ANGIOGRAPHY;  Surgeon: Court Dorn PARAS, MD;  Location: [facility];  Service: Cardiovascular;  Laterality: N/A;   PORTACATH PLACEMENT Right 11/23/2019   Procedure: INSERTION PORT-A-CATH WITH ULTRASOUND GUIDANCE;  Surgeon: Ebbie Cough, MD;  Location: [facility];  Service: General;  Laterality: Right;   REPLACEMENT TOTAL KNEE Right    SPINE SURGERY     TOTAL ABDOMINAL HYSTERECTOMY     FOR UTERUS CANCER   TOTAL HIP ARTHROPLASTY Left 2015   Patient Active Problem List   Diagnosis Date Noted   Disorder associated with type 2 diabetes mellitus (HCC) 06/20/2023   Restless leg syndrome 05/26/2023   Referred otalgia of left ear 11/19/2022   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   Erosive osteoarthritis of multiple sites 09/26/2021   Frequent headaches 05/31/2021   Bilateral impacted cerumen 02/22/2021   Nasal  septal perforation 02/22/2021   Postnasal drip 02/22/2021   Hoarseness 01/25/2021   Hyperglycemia due to type 2 diabetes mellitus (HCC) 01/18/2021   Mixed simple and mucopurulent chronic bronchitis (HCC) 12/27/2020   Centrilobular emphysema (HCC) 07/31/2020   Shortness of breath 07/31/2020   Tongue lesion 04/30/2020   Hypothyroid    Anxiety    Anxiety and depression    Port-A-Cath in place 11/24/2019   Hepatic steatosis 09/28/2019   Malignant neoplasm of upper-outer quadrant of left breast in female, estrogen receptor positive (HCC) 09/02/2019   Trigger index finger of right hand 05/05/2019   Primary  osteoarthritis of first carpometacarpal joint of right hand 05/05/2019   Osteoarthritis of finger of right hand 05/05/2019   Endometrial cancer (HCC) 10/23/2018   Cirrhosis of liver without ascites (HCC) 10/23/2018   Diarrhea 10/23/2018   Chest pain 02/15/2018   Coronary artery disease involving native coronary artery of native heart without angina pectoris 11/18/2017   Hyperlipidemia LDL goal <70 11/18/2017   Diabetes mellitus treated with insulin  and oral medication (HCC) 11/18/2017   Dizziness 11/18/2017   Type 2 diabetes mellitus without complication, without long-term current use of insulin  (HCC) 11/18/2017    PCP: Kathrene Mardy HERO, PA-C  REFERRING PROVIDER: Craig Alan SAUNDERS, PA-C   REFERRING DIAG: K52.9 (ICD-10-CM) - Chronic diarrhea  THERAPY DIAG:  Muscle weakness (generalized)  Unspecified lack of coordination  Abnormal posture  Other muscle spasm  Rationale for Evaluation and Treatment: Rehabilitation  ONSET DATE: 3 years ago  SUBJECTIVE:                                                                                                                                                                                           SUBJECTIVE STATEMENT: Pt states that her bladder is not doing well. She has a urinary tract infection currently - she goes to the doctor tomorrow. She feels like manual techniques to Rt hip were very helpful last session. She does feel like she is emptying her bladder more completely. She feels like she has more control prior to getting UTI.   PAIN: 07/07/23 Are you having pain? Yes NPRS scale: 6/10 Pain location: hips, low back, LE  Pain type: aching Pain description: constant   Aggravating factors: walking, standing  Relieving factors: lying down   PRECAUTIONS: Other: hx of uterine and breast cancer   RED FLAGS: None   WEIGHT BEARING RESTRICTIONS: No  FALLS:  Has patient fallen in last 6 months? Yes. Number of falls 1x, 12/20/23  (tripped over curb)  OCCUPATION: not working  ACTIVITY LEVEL : none currently  PLOF: Independent  PATIENT GOALS: control  over bowel movements   PERTINENT HISTORY:  Uterine  cancer with vaginal radiation 2014, invasive mammary carcinoma Lt 2021 with lumpectomy and chemoradiation, anastrozole , CAD with stent 2018, emphysema, cirrhosis, type 2 diabetes, cholecystectomy, Rt TKA, Lt THA, total abdominal hysterectomy (from cancer)   BOWEL MOVEMENT: Pain with bowel movement: No - states that she hardly feels it Type of bowel movement:Type (Bristol Stool Scale) 5-7, Frequency many times a day, usually small amounts every time she urinates, and Strain no - just comes out Fully empty rectum: No Leakage: Yes: unaware, with passing gas Pads: Yes: 3 briefs on bad days (day and night) Fiber supplement/laxative Yes and Miralax   URINATION: Pain with urination: No Fully empty bladder: Yes:   Stream: Strong Urgency: Yes  Frequency: over 3 or 4 hours; no nocturia Leakage: Urge to void, Walking to the bathroom, and standing up Pads: Yes: 3 briefs a day/night  INTERCOURSE:  Nor sexually active and no history of pain  PREGNANCY: Vaginal deliveries 1 Tearing No Episiotomy No C-section deliveries 1 Currently pregnant No  PROLAPSE: None   OBJECTIVE:  Note: Objective measures were completed at Evaluation unless otherwise noted.  04/23/23: PATIENT SURVEYS:   PFIQ-7: 62  COGNITION: Overall cognitive status: Within functional limits for tasks assessed     SENSATION: Light touch: Appears intact  FUNCTIONAL TESTS:  Squat: bil valgus knee collapse Single leg stance:  Rt: compensated trendelenburg with UE support  Lt: pelvic drop with UE support Curl-up test: significant abdominal distortion    GAIT: Assistive device utilized: None Comments: Rt antalgic pattern, decreased bil hip extension  POSTURE: rounded shoulders, forward head, decreased lumbar lordosis, increased thoracic  kyphosis, and posterior pelvic tilt   LUMBARAROM/PROM:  A/PROM A/PROM  Eval (% available)  Flexion 75  Extension 50  Right lateral flexion 50  Left lateral flexion 50  Right rotation 50  Left rotation 50   (Blank rows = not tested)   PALPATION:   General: significant tightness in bil obliques and lumbar paraspinals   Abdominal: abdominal distention and tightness; scar tissue                  External Perineal Exam: dry                             Internal Pelvic Floor: vaginal stenosis, scar tissue restriction, decreased mobility; decreased rectal sensation; decreased ability to create appropriate intra-rectal pressure when attempting to push like during bowel movement   Patient confirms identification and approves PT to assess internal pelvic floor and treatment Yes  PELVIC MMT:   MMT eval  Vaginal No active contraction  Internal Anal Sphincter No active contraction  External Anal Sphincter 1/5  Puborectalis 1/5  Diastasis Recti 4 finger widths   (Blank rows = not tested)        TONE: low  PROLAPSE: None detected, but difficulty with coordination of appropriate bearing down   TODAY'S TREATMENT:  DATE:  07/07/23 Manual: Soft tissue mobilization/trigger point release to Rt glutes and lumbar paraspinals  Neuromuscular re-education: Unilateral side lying UE ball press with transversus abdominus and pelvic floor muscle contractions and breath coordination 10x Side lying clam shells 10x bil Bridge with hip adduction, transversus abdominus, and pelvic floor muscle 10x Supine hip abduction red band 10x   06/30/23 Manual: In Lt side lying, Rt glute trigger point release and soft tissue mobilization  Neuromuscular re-education: Bridge with hip adduction, transversus abdominus, and pelvic floor muscle 2 x 10 Supine hip abduction red band 2 x  10 Exercises: Open books 6x Lower trunk rotation 2 x 10 Single knee to chest 5x  06/19/23 Neuromuscular re-education: Pt provides verbal consent for internal vaginal/rectal pelvic floor exam. Internal rectal manual biofeedback in side lying Quick flicks Bulge/push training Seated hip adduction ball press with transversus abdominus and pelvic floor muscle 2 x 10 Seated hip abduction red band with transversus abdominus and pelvic floor muscle 2 x 10 Seated resisted march red band with transversus abdominus and pelvic floor muscle 2 x 10    PATIENT EDUCATION:  Education details: See above Person educated: Patient Education method: Programmer, multimedia, Demonstration, Actor cues, Verbal cues, and Handouts Education comprehension: verbalized understanding  HOME EXERCISE PROGRAM: [id number]  ASSESSMENT:  CLINICAL IMPRESSION: Pt doing better with pain this week and felt like manual techniques were very helpful last week. She would like to try dry needling, but needs to wait until next month for financial reasons. She is seeing progress with bladder control and complete emptying, but now feels like she has a UTI which is causing more difficulty (going to MD tomorrow). She tolerated manual techniques again today with good tolerance and further reduction in pain. She did well with strengthening activities, but did have consistent cramping in bil hamstrings when doing bridges to the point we had to stop. She will continue to benefit from skilled PT intervention in order to improve fecal and urinary incontinence, begin/progress functional strengthening program, and improve quality of life.   OBJECTIVE IMPAIRMENTS: decreased activity tolerance, decreased coordination, decreased endurance, decreased mobility, decreased ROM, decreased strength, increased fascial restrictions, increased muscle spasms, impaired tone, postural dysfunction, and pain.   ACTIVITY LIMITATIONS: continence  PARTICIPATION  LIMITATIONS: community activity  PERSONAL FACTORS: 3+ comorbidities: medical history are also affecting patient's functional outcome.   REHAB POTENTIAL: Good  CLINICAL DECISION MAKING: Evolving/moderate complexity  EVALUATION COMPLEXITY: Moderate   GOALS: Goals reviewed with patient? Yes  SHORT TERM GOALS: Updated 06/19/23   Pt will be independent with HEP.   Baseline: Goal status: IN PROGRESS 06/19/23  2.  Pt will report 25% improvement in fecal incontinence.  Baseline:  Goal status: IN PROGRESS 06/19/23  3.  Pt will be able to teach back and utilize urge suppression technique in order to help reduce number of trips to the bathroom.    Baseline:  Goal status: IN PROGRESS 06/19/23  4.  Pt will decrease number of bowel movements to no more than 3 a day in order to decrease skin irritation and demonstrate more complete emptying.  Baseline:  Goal status: IN PROGRESS 06/19/23  5.  Pt will be independent with use of squatty potty, relaxed toileting mechanics, and improved bowel movement techniques in order to increase ease of bowel movements and complete evacuation so she can decrease leaking.   Baseline:  Goal status: IN PROGRESS 06/19/23   LONG TERM GOALS: Updated 06/19/23  Pt will be independent with advanced HEP.   Baseline:  Goal status: IN PROGRESS 06/19/23  2.  Pt will report 75% improvement in fecal incontinence.  Baseline:  Goal status: IN PROGRESS 06/19/23  3.  Pt will be able to pass gas without fecal incontinence in order to decrease number of briefs she is using daily.  Baseline:  Goal status: IN PROGRESS 06/19/23  4.  Pt will report no more than 1 brief a day in order to decrease amount she is having to buy and demonstrate improved bowel control.  Baseline:  Goal status: IN PROGRESS 06/19/23  5.  Pt will demonstrate normal pelvic floor muscle tone and A/ROM, able to achieve 4/5 strength with contractions and 10 sec endurance, in order to provide appropriate lumbopelvic  support in functional activities.   Baseline:  Goal status: IN PROGRESS 06/19/23  6.  Pt will be able to go 2-3 hours in between voids without urgency or incontinence in order to improve QOL and perform all functional activities with less difficulty.   Baseline:  Goal status: IN PROGRESS 06/19/23  PLAN:  PT FREQUENCY: 1-2x/week  PT DURATION: 12 weeks  PLANNED INTERVENTIONS: 97110-Therapeutic exercises, 97530- Therapeutic activity, 97112- Neuromuscular re-education, 97535- Self Care, [id number]- Manual therapy, Dry Needling, and Biofeedback  PLAN FOR NEXT SESSION: Return to internal rectal exam to work on biofeedback of pelvic floor muscle contraction; biofeedback; hip/core exercises.    Josette Mares, PT, DPT06/23/253:29 PM

## 2023-07-08 ENCOUNTER — Ambulatory Visit: Admitting: Physician Assistant

## 2023-07-08 ENCOUNTER — Other Ambulatory Visit (HOSPITAL_COMMUNITY): Payer: Self-pay

## 2023-07-08 ENCOUNTER — Other Ambulatory Visit: Payer: Self-pay

## 2023-07-08 ENCOUNTER — Encounter: Payer: Self-pay | Admitting: Physician Assistant

## 2023-07-08 VITALS — BP 104/58 | HR 70 | Temp 97.2°F | Ht 64.0 in | Wt 186.2 lb

## 2023-07-08 DIAGNOSIS — G8929 Other chronic pain: Secondary | ICD-10-CM

## 2023-07-08 DIAGNOSIS — Z794 Long term (current) use of insulin: Secondary | ICD-10-CM | POA: Diagnosis not present

## 2023-07-08 DIAGNOSIS — F32A Depression, unspecified: Secondary | ICD-10-CM

## 2023-07-08 DIAGNOSIS — B372 Candidiasis of skin and nail: Secondary | ICD-10-CM | POA: Diagnosis not present

## 2023-07-08 DIAGNOSIS — E1165 Type 2 diabetes mellitus with hyperglycemia: Secondary | ICD-10-CM | POA: Diagnosis not present

## 2023-07-08 DIAGNOSIS — F419 Anxiety disorder, unspecified: Secondary | ICD-10-CM

## 2023-07-08 DIAGNOSIS — R300 Dysuria: Secondary | ICD-10-CM

## 2023-07-08 LAB — POC URINALSYSI DIPSTICK (AUTOMATED)
Bilirubin, UA: NEGATIVE
Blood, UA: POSITIVE
Glucose, UA: POSITIVE — AB
Ketones, UA: NEGATIVE
Nitrite, UA: POSITIVE
Protein, UA: POSITIVE — AB
Spec Grav, UA: 1.02 (ref 1.010–1.025)
Urobilinogen, UA: 0.2 U/dL
pH, UA: 5.5 (ref 5.0–8.0)

## 2023-07-08 MED ORDER — TRAMADOL HCL 50 MG PO TABS
50.0000 mg | ORAL_TABLET | Freq: Four times a day (QID) | ORAL | 0 refills | Status: DC | PRN
  Filled 2023-07-08: qty 30, 8d supply, fill #0

## 2023-07-08 MED ORDER — OZEMPIC (0.25 OR 0.5 MG/DOSE) 2 MG/3ML ~~LOC~~ SOPN
PEN_INJECTOR | SUBCUTANEOUS | 0 refills | Status: DC
  Filled 2023-07-08: qty 6, 70d supply, fill #0

## 2023-07-08 MED ORDER — KETOCONAZOLE 2 % EX CREA
1.0000 | TOPICAL_CREAM | Freq: Every day | CUTANEOUS | 0 refills | Status: AC
  Filled 2023-07-08: qty 60, 30d supply, fill #0

## 2023-07-08 MED ORDER — CEPHALEXIN 500 MG PO CAPS
500.0000 mg | ORAL_CAPSULE | Freq: Three times a day (TID) | ORAL | 0 refills | Status: AC
  Filled 2023-07-08: qty 21, 7d supply, fill #0

## 2023-07-08 MED ORDER — BUPROPION HCL ER (XL) 150 MG PO TB24
150.0000 mg | ORAL_TABLET | Freq: Every morning | ORAL | 2 refills | Status: DC
  Filled 2023-07-08 – 2023-08-05 (×2): qty 30, 30d supply, fill #0
  Filled 2023-09-03: qty 30, 30d supply, fill #1
  Filled 2023-10-01: qty 30, 30d supply, fill #2

## 2023-07-08 NOTE — Patient Outreach (Signed)
 Complex Care Management   Visit Note  07/07/2023  Name:  Felicia [name] MRN: [id number] DOB: [birth year]  Situation: Referral received for Complex Care Management related to Diabetes with Complications I obtained verbal consent from Patient.  Visit completed with patient  on the phone  Background:   Past Medical History:  Diagnosis Date   Adenomatous colon polyp 2015   Anemia    Anxiety    Breast cancer (HCC) 09/2019   left breast IMC   CAD (coronary artery disease)    a. 3/2007s/p DES to the LAD ([place] ); b. 01/2017 MV: EF 80%, small, mild apical ant defect w/o ischemia (felt to be breast atten). Low risk.   Depression    DM (diabetes mellitus) (HCC)    Emphysema lung (HCC)    Essential hypertension    GERD (gastroesophageal reflux disease)    Headache    secondary to neck surgery per patient   High triglycerides    HTN (hypertension)    Hyperlipidemia    Hypothyroid    OA (osteoarthritis)    right knee,hands   Overflow incontinence    PONV (postoperative nausea and vomiting)    s/p gallbladder    RLS (restless legs syndrome)    Urinary urgency    Uterine cancer (HCC) dx'd 2014    Assessment: Patient Reported Symptoms:  Cognitive Cognitive Status: Normal speech and language skills, Insightful and able to interpret abstract concepts, Alert and oriented to person, place, and time Cognitive/Intellectual Conditions Management [RPT]: None reported or documented in medical history or problem list   Health Maintenance Behaviors: Annual physical exam, Exercise, Healthy diet Healing Pattern: Unsure Health Facilitated by: Pain control, Healthy diet, Rest  Neurological Neurological Review of Symptoms: No symptoms reported Neurological Self-Management Outcome: 4 (good)  HEENT HEENT Symptoms Reported: No symptoms reported, Other: Other HEENT Symptoms/Conditions: Intermittently - bilateral impacted cerumen HEENT Conditions: Ear problem(s) HEENT Management Strategies:  Routine screening HEENT Self-Management Outcome: 4 (good) Ear problem(s)  Cardiovascular Cardiovascular Symptoms Reported: No symptoms reported Does patient have uncontrolled Hypertension?: No Cardiovascular Conditions: Coronary artery disease, Hypertension Cardiovascular Management Strategies: Medication therapy Cardiovascular Self-Management Outcome: 4 (good) Cardiovascular Comment: takes Zebeta  5mg  at bedtime, states BPs have been good  Respiratory Respiratory Symptoms Reported: No symptoms reported Respiratory Conditions: Seasonal allergies, COPD Respiratory Self-Management Outcome: 4 (good) Respiratory Comment: PMH - centrilobular emphysema  Endocrine Patient reports the following symptoms related to hypoglycemia or hyperglycemia : Other Other symptoms related to hypoglycemia or hyperglycemia: patient states her blood sugars have been higher than she likes and has an appt with her PCP tomorrow, 6/24 for probable adjustments/additions to her medication for DMII Is patient diabetic?: Yes Is patient checking blood sugars at home?: Yes Endocrine Conditions: Diabetes Endocrine Management Strategies: Medication therapy, Medical device, Adequate rest, Coping strategies, Diet modification, Exercise, Routine screening Endocrine Self-Management Outcome: 3 (uncertain) Endocrine Comment: Had telephone appt with Clinical Pharmacist Tammy Eckard on 6/19, following up with PCP tomorrow, 6/24  Gastrointestinal Gastrointestinal Symptoms Reported: No symptoms reported Gastrointestinal Conditions: Other Other Gastrointestinal Conditions: 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, Cirrhosis of liver withou ascites Gastrointestinal Management Strategies: Coping strategies, Diet modification Gastrointestinal Self-Management Outcome: 4 (good) Nutrition Risk Screen (CP): No indicators present  Genitourinary Genitourinary Symptoms Reported: No symptoms reported Additional Genitourinary Details: Hx of endometrial  cancer, left breast cancer Genitourinary Conditions: Other Other Genitourinary Conditions: HX of endometrial cancer and left breast cancer (estrogen receptor positive - on Anastrozole ) Genitourinary Self-Management Outcome: 4 (good)  Integumentary Integumentary Symptoms Reported: No symptoms reported  Musculoskeletal Musculoskelatal Symptoms Reviewed: Unsteady gait, Other Other Musculoskeletal Symptoms: frequent falls - patient explains her right leg often gives out from under her Additional Musculoskeletal Details: PMH of right hip replacement Musculoskeletal Conditions: Osteoarthritis, Unsteady gait Musculoskeletal Management Strategies: Adequate rest, Exercise, Coping strategies, Medication therapy, Medical device Musculoskeletal Self-Management Outcome: 3 (uncertain) Musculoskeletal Comment: Since last RNCM televisit patient stated she fell once while getting into bed, landing on her knees and elbows, denied any injury. Is receiving weekly Outpatient PT from [facility] in the past year?: Yes Number of falls in past year: 2 or more Was there an injury with Fall?: Yes Fall Risk Category Calculator: 3 Patient Fall Risk Level: High Fall Risk Patient at Risk for Falls Due to: History of fall(s), Impaired balance/gait, Impaired mobility Fall risk Follow up: Falls prevention discussed, Education provided, Falls evaluation completed  Psychosocial Psychosocial Symptoms Reported: No symptoms reported     Quality of Family Relationships: supportive, helpful Do you feel physically threatened by others?: No      07/01/2023    1:11 PM  Depression screen PHQ 2/9  Decreased Interest 1  Down, Depressed, Hopeless 1  PHQ - 2 Score 2  Altered sleeping 1  Tired, decreased energy 1  Change in appetite 1  Feeling bad or failure about yourself  1  Trouble concentrating 1  Moving slowly or fidgety/restless 0  Suicidal thoughts 0  PHQ-9 Score 7  Difficult doing work/chores Somewhat  difficult    There were no vitals filed for this visit.  Medications Reviewed Today     Reviewed by Gordy Channing LABOR, RN (Registered Nurse) on 07/07/23 at 1650  Med List Status: <None>   Medication Order Taking? Sig Documenting Provider Last Dose Status Informant  ACCU-CHEK GUIDE test strip [id number] Yes USE TO check blood glucose UP TO four times daily AS DIRECTED Allwardt, Alyssa M, PA-C  Active   Accu-Chek Softclix Lancets lancets [id number] Yes USE TO check blood glucose UP TO four times daily AS DIRECTED Allwardt, Alyssa M, PA-C  Active   acetaminophen  (TYLENOL ) 500 MG tablet [id number] Yes Take 500 mg by mouth every 6 (six) hours as needed for mild pain (pain score 1-3) or moderate pain (pain score 4-6). [provider]  Active   albuterol  (VENTOLIN  HFA) 108 (90 Base) MCG/ACT inhaler [id number] Yes Inhale 2 puffs into the lungs every 6 (six) hours as needed for wheezing or shortness of breath. Kassie Acquanetta Bradley, MD  Active   anastrozole  (ARIMIDEX ) 1 MG tablet [id number] Yes Take 1 tablet (1 mg total) by mouth daily. Gudena, Vinay, MD  Active   Ascorbic Acid (VITAMIN C PO) [id number] Yes Take 1 tablet by mouth daily.  Patient taking differently: Take 500 mg by mouth 2 (two) times daily.   [provider]  Active Self  aspirin  EC (ASPIRIN  81) 81 MG tablet [id number] Yes Take 1 tablet (81 mg total) by mouth in the morning. Allwardt, Mardy HERO, PA-C  Active   azelastine  (ASTELIN ) 0.1 % nasal spray [id number] Yes Place 2 sprays into both nostrils 2 (two) times daily. Use in each nostril as directed Allwardt, Alyssa M, PA-C  Active   bisoprolol  (ZEBETA ) 5 MG tablet [id number] Yes Take 1 tablet (5 mg total) by mouth at bedtime. Nahser, Aleene PARAS, MD  Active   blood glucose meter kit and supplies [id number] Yes Dispense based on patient and insurance preference. Use up to four times daily as directed. (FOR ICD-10 E10.9, E11.9). Allwardt, Mardy HERO, PA-C  Active   buPROPion  (WELLBUTRIN  XL)  150 MG 24 hr tablet [id number]  Take 1 tablet (150 mg total) by mouth every morning for depression.  Patient not taking: Reported on 07/07/2023   Allwardt, Mardy HERO, PA-C  Active   celecoxib  (CELEBREX ) 200 MG capsule [id number] Yes Take 1 capsule (200 mg total) by mouth 2 (two) times daily. Allwardt, Alyssa M, PA-C  Active   CINNAMON PO [id number] Yes Take 1 capsule by mouth daily with supper. [provider]  Active Self  clotrimazole -betamethasone  (LOTRISONE) cream [id number] Yes Apply 1 Application topically 2 (two) times daily. [provider]  Active   diclofenac  sodium (VOLTAREN ) 1 % GEL [id number] Yes Apply 2-4 g topically 4 (four) times daily as needed (as directed for pain). [provider]  Active Self  empagliflozin  (JARDIANCE ) 25 MG TABS tablet [id number] Yes Take 1 tablet (25 mg total) by mouth daily. Allwardt, Alyssa M, PA-C  Active   escitalopram  (LEXAPRO ) 20 MG tablet [id number] Yes Take 1 tablet (20 mg total) by mouth daily. Allwardt, Alyssa M, PA-C  Active   fenofibrate  (TRICOR ) 145 MG tablet [id number] Yes Take 1 tablet (145 mg total) by mouth at bedtime. Nahser, Aleene PARAS, MD  Active   Ferrous Sulfate (IRON PO) [id number] Yes Take 1 tablet by mouth daily. alternates days:1 tablet one day and 2 tablets the next day [provider]  Active Self  gabapentin  (NEURONTIN ) 600 MG tablet [id number] Yes Take 1 tablet (600 mg total) by mouth 3 (three) times daily. Skeet Juliene SAUNDERS, DO  Active   glipiZIDE  (GLUCOTROL  XL) 10 MG 24 hr tablet [id number] Yes Take 2 tablets (20 mg total) by mouth every morning. Allwardt, Mardy HERO, PA-C  Active   glucose blood test strip [id number] Yes Use to check blood sugar up to four times daily as directed Allwardt, Alyssa M, PA-C  Active   HYDROcodone -acetaminophen  (NORCO/VICODIN) 5-325 MG tablet [id number] Yes Take 1 tablet by mouth every 6 (six) hours as needed. Joane Artist RAMAN, MD  Active   Incontinence Supply Disposable (DEPEND ADJUSTABLE  UNDERWEAR) MISC [id number] Yes 1 Application by Does not apply route daily. Allwardt, Mardy HERO, PA-C  Active   insulin  glargine (LANTUS  SOLOSTAR) 100 UNIT/ML Solostar Pen [id number] Yes Start with injecting 4 units under the skin in the evenings. Increase by 2 units every 2 days until monring fasting glucose is to goal (140). Max units 60 per day.  Patient taking differently: Inject 52 Units into the skin daily. Start with injecting 4 units under the skin in the evenings. Increase by 2 units every 2 days until monring fasting glucose is to goal (140). Max units 60 per day.   Allwardt, Mardy HERO, PA-C  Active   Insulin  Pen Needle (INSUPEN PEN NEEDLES) 32G X 4 MM MISC [id number] Yes Use to inject insulin  as directed Allwardt, Mardy HERO, PA-C  Active   Lancets MISC [id number] Yes Use to check blood sugar up to four times daily as directed Allwardt, Alyssa M, PA-C  Active   levothyroxine  (SYNTHROID ) 125 MCG tablet [id number] Yes Take 1 tablet (125 mcg total) by mouth daily before breakfast. Allwardt, Alyssa M, PA-C  Active   loratadine  (ALLERGY RELIEF) 10 MG tablet [id number] Yes Take 1 tablet (10 mg total) by mouth every evening. Allwardt, Alyssa M, PA-C  Active   magnesium gluconate (MAGONATE) 500 (27 Mg) MG TABS tablet [id number] Yes Take 500 mg by mouth daily. [provider]  Active   Multiple Vitamins-Calcium  (ONE-A-DAY WOMENS PO)  [id number] Yes Take 1 tablet by mouth daily. [provider]  Active Self  mupirocin  cream (BACTROBAN ) 2 % [id number] Yes Apply 1 Application topically 3 (three) times daily. Allwardt, Alyssa M, PA-C  Active   nitroGLYCERIN  (NITROSTAT ) 0.4 MG SL tablet [id number] Yes Dissolve 1 tablet under the tongue every 5 minutes as needed for chest pain. Max of 3 doses, then 911. Nahser, Aleene PARAS, MD  Active   omeprazole  (PRILOSEC) 40 MG capsule [id number] Yes Take 1 capsule (40 mg total) by mouth every evening. Allwardt, Mardy HERO, PA-C  Active   polyethylene glycol powder  (MIRALAX ) 17 GM/SCOOP powder [id number] Yes Take 17 g by mouth daily.  Patient taking differently: Take 17 g by mouth daily as needed.   Craig Alan JONELLE DEVONNA  Active   Potassium 99 MG TABS [id number] Yes Take 1 tablet by mouth daily. [provider]  Active   rOPINIRole  (REQUIP ) 1 MG tablet [id number] Yes Take 1 tablet (1 mg total) by mouth at bedtime. Corey, Evan S, MD  Active   Tiotropium Bromide-Olodaterol (STIOLTO RESPIMAT ) 2.5-2.5 MCG/ACT AERS [id number] Yes Inhale 2 puffs into the lungs daily. Kassie Acquanetta Bradley, MD  Active   tiZANidine  (ZANAFLEX ) 2 MG tablet [id number] Yes Take 1 tablet (2 mg total) by mouth 3 (three) times daily. Skeet Juliene JONELLE, DO  Active   traMADol  (ULTRAM ) 50 MG tablet [id number] Yes Take 1 tablet (50 mg total) by mouth every 6 (six) hours as needed for moderate pain (pain score 4-6) or severe pain (pain score 7-10). Allwardt, Alyssa M, PA-C  Active   triamcinolone  (KENALOG ) 0.1 % paste [id number]  Apply a small amount to affected area twice daily for 2 - 3 weeks.  Patient not taking: Reported on 07/07/2023     Active   TURMERIC PO [id number] Yes Take 1 capsule by mouth daily with supper. [provider]  Active Self  Wheat Dextrin [facility]) POWD [id number] Yes 1-2 TBSP work up to 3 x a day, mix in water or liquid Craig Alan JONELLE, [place]  Active             Recommendation:   PCP Follow-up tomorrow, 07/08/2023 with Mardy Dyke, PA-C  Follow Up Plan:   Telephone follow up appointment date/time:  07/21/2023  Channing A. Gordy RN, BA, [facility], CRRN [place]  [facility] Population Health RN Care Manager Direct Dial: [phone]  Fax: [phone]

## 2023-07-08 NOTE — Progress Notes (Signed)
 Patient ID: Felicia [name], female    DOB: 1950-09-22, 73 y.o.   MRN: [id number]   Assessment & Plan:  Uncontrolled type 2 diabetes mellitus with hyperglycemia (HCC) -     Ozempic (0.25 or 0.5 MG/DOSE); Inject 0.25 mg into the skin once a week for 28 days, THEN 0.5 mg once a week for 28 days.  Dispense: 6 mL; Refill: 0  Anxiety and depression -     buPROPion  HCl ER (XL); Take 1 tablet (150 mg total) by mouth every morning for depression.  Dispense: 30 tablet; Refill: 2  Other chronic pain -     traMADol  HCl; Take 1 tablet (50 mg total) by mouth every 6 (six) hours as needed for moderate pain (pain score 4-6) or severe pain (pain score 7-10).  Dispense: 30 tablet; Refill: 0  Intertriginous candidiasis -     Ketoconazole; Apply 1 Application topically daily.  Dispense: 60 g; Refill: 0  Dysuria -     POCT Urinalysis Dipstick (Automated) -     Urine Culture -     Cephalexin ; Take 1 capsule (500 mg total) by mouth 3 (three) times daily for 7 days.  Dispense: 21 capsule; Refill: 0      Assessment and Plan Assessment & Plan Pelvic floor dysfunction Chronic pelvic floor dysfunction with severe pain reported at a level of 9-10 last week. Physical therapy, including massage, has been beneficial. She is considering dry needling, as recommended by the pelvic floor physical therapist, to improve blood flow and healing. - Proceed with dry needling therapy as recommended by pelvic floor physical therapist.  Diabetes mellitus Diabetes management is suboptimal with fluctuating blood glucose levels. Recent readings include 161 mg/dL fasting, 809 mg/dL, and 797 mg/dL at various times. She experiences hypoglycemic symptoms at blood glucose levels in the 90s. There is a plan to try Ozempic, as she had a good response to Trulicity  previously. She is currently on 52 units of insulin  at night. Ozempic is similar to Trulicity  and may require insulin  adjustment based on blood glucose readings and  hypoglycemic symptoms. - Initiate Ozempic at starting dose of 0.25 mg weekly. - Monitor blood glucose levels closely. - Adjust insulin  dosage based on blood glucose readings and hypoglycemic symptoms. - Maintain a log of blood glucose and blood pressure readings. - Ensure availability of snacks to manage hypoglycemia.  Urinary tract infection (UTI) Symptoms suggestive of a urinary tract infection, including burning during urination and increased frequency. - Perform urinalysis to confirm urinary tract infection.  Intertrigo Rash under both breasts, likely fungal in nature, consistent with intertrigo. She has used antifungal creams like nystatin  or ketoconazole in the past. - Prescribe antifungal cream for application under the breasts.  General Health Maintenance She is planning to make an appointment with an ophthalmologist due to worsening vision and headaches. There is a need to follow up with oncology for yearly appointments and to address elevated white blood cell count. - Schedule appointment with ophthalmologist. - Contact oncology for yearly follow-up. - Monitor white blood cell count.  Follow-up She is planning to extend physical therapy sessions and has an upcoming appointment with a neurologist. She is also planning to follow up with an endocrinologist in September. - Extend physical therapy sessions. - Follow up with neurologist as scheduled. - Follow up with endocrinologist in September.      Return in about 8 weeks (around 09/02/2023) for recheck/follow-up.    Subjective:    Chief Complaint  Patient presents with  Medical Management of Chronic Issues    Pt in office for 3 mon f/u; also wanted PCP to be aware she has rash under both breasts and seems to be like a heat rash; not bothersome at this point;     HPI Discussed the use of AI scribe software for clinical note transcription with the patient, who gave verbal consent to proceed.  History of Present  Illness Felicia [name] is a 73 year old female with diabetes who presents with pelvic pain and a rash under the breasts.  She has been experiencing severe pelvic pain for the past week, rating it as a 9 or 10 out of 10, which has made walking difficult. She attended pelvic floor physical therapy where muscle knots were identified and massaged, providing some relief. She has been using a massage gun on her legs, which has helped, but the pain persists.  She reports fluctuating blood sugar levels with readings of 61, 190, 202, 151, and 191. She experiences symptoms when her levels are in the 90s. She is currently on 52 units of insulin  at night. She has not tried Ozempic but her sister has had success with it.  She has developed a rash under both breasts in the last day or two. She has used creams like nystatin  and ketoconazole in the past but is unsure if she has any left. She also reports burning during urination and frequent nighttime urination, which she has always called a yeast infection but is unsure if that is the cause.  She mentions a history of high white blood cell count, with recent levels at 14.9, up from 12.7 six months ago. She has ongoing issues with muscle pain in her legs and back, and notes that her toe, which was previously problematic, is now looking better.  She has not completed blood work ordered by her neurologist, who is investigating a potential eyelid-related condition. She plans to follow up with the neurologist and cancer specialists for her yearly appointments. She is also planning to make an appointment with an eye doctor due to worsening vision and headaches.     Past Medical History:  Diagnosis Date   Adenomatous colon polyp 2015   Anemia    Anxiety    Breast cancer (HCC) 09/2019   left breast IMC   CAD (coronary artery disease)    a. 3/2007s/p DES to the LAD ([place] ); b. 01/2017 MV: EF 80%, small, mild apical ant defect w/o ischemia (felt to be breast  atten). Low risk.   Depression    DM (diabetes mellitus) (HCC)    Emphysema lung (HCC)    Essential hypertension    GERD (gastroesophageal reflux disease)    Headache    secondary to neck surgery per patient   High triglycerides    HTN (hypertension)    Hyperlipidemia    Hypothyroid    OA (osteoarthritis)    right knee,hands   Overflow incontinence    PONV (postoperative nausea and vomiting)    s/p gallbladder    RLS (restless legs syndrome)    Urinary urgency    Uterine cancer (HCC) dx'd 2014    Past Surgical History:  Procedure Laterality Date   BREAST LUMPECTOMY WITH RADIOACTIVE SEED AND SENTINEL LYMPH NODE BIOPSY Left 10/07/2019   Procedure: LEFT BREAST LUMPECTOMY WITH RADIOACTIVE SEED AND SENTINEL LYMPH NODE BIOPSY;  Surgeon: Ebbie Cough, MD;  Location: [facility];  Service: General;  Laterality: Left;   CHOLECYSTECTOMY     CORONARY STENT  PLACEMENT     CORONARY/GRAFT ACUTE MI REVASCULARIZATION     FINGER ARTHRODESIS Right 06/11/2019   Procedure: ARTHRODESIS INDEX FINGER DISTAL PHALANGEAL JOINT;  Surgeon: Murrell Drivers, MD;  Location: [facility];  Service: Orthopedics;  Laterality: Right;   FOOT SURGERY Left    LEFT HEART CATH AND CORONARY ANGIOGRAPHY N/A 02/16/2018   Procedure: LEFT HEART CATH AND CORONARY ANGIOGRAPHY;  Surgeon: Court Dorn PARAS, MD;  Location: [facility];  Service: Cardiovascular;  Laterality: N/A;   LEFT HEART CATH AND CORONARY ANGIOGRAPHY N/A 05/01/2020   Procedure: LEFT HEART CATH AND CORONARY ANGIOGRAPHY;  Surgeon: Court Dorn PARAS, MD;  Location: [facility];  Service: Cardiovascular;  Laterality: N/A;   PORTACATH PLACEMENT Right 11/23/2019   Procedure: INSERTION PORT-A-CATH WITH ULTRASOUND GUIDANCE;  Surgeon: Ebbie Cough, MD;  Location: [facility];  Service: General;  Laterality: Right;   REPLACEMENT TOTAL KNEE Right    SPINE SURGERY     TOTAL ABDOMINAL HYSTERECTOMY     FOR  UTERUS CANCER   TOTAL HIP ARTHROPLASTY Left 2015    Family History  Problem Relation Age of Onset   AAA (abdominal aortic aneurysm) Mother    Thyroid  disease Mother    Colon cancer Mother    Diabetes Father    Alzheimer's disease Father    Arthritis Sister    Heart disease Sister    Throat cancer Sister    Kidney failure Sister    Diabetes Sister    Diabetes Brother    Diabetes Brother    Breast cancer Cousin     Social History   Tobacco Use   Smoking status: Never   Smokeless tobacco: Never  Vaping Use   Vaping status: Never Used  Substance Use Topics   Alcohol use: Yes    Comment: OCCASIONALLY   Drug use: No     Allergies  Allergen Reactions   Chloraprep One Step [Chlorhexidine  Gluconate] Rash   Estrogens Other (See Comments)    PATIENT HAS A HISTORY OF CANCER AND HAS BEEN TOLD TO NEVER TAKE ANYTHING CONTAINING ESTROGEN, AS IT MIGHT CAUSE A RECURRENCE   Shellfish-Derived Products Nausea And Vomiting   Shrimp [Shellfish Allergy] Nausea And Vomiting    Review of Systems NEGATIVE UNLESS OTHERWISE INDICATED IN HPI      Objective:     BP (!) 104/58 (BP Location: Left Arm, Patient Position: Sitting, Cuff Size: Normal)   Pulse 70   Temp (!) 97.2 F (36.2 C) (Temporal)   Ht 5' 4 (1.626 m)   Wt 186 lb 3.2 oz (84.5 kg)   SpO2 96%   BMI 31.96 kg/m   Wt Readings from Last 3 Encounters:  07/08/23 186 lb 3.2 oz (84.5 kg)  07/01/23 184 lb (83.5 kg)  06/25/23 184 lb (83.5 kg)    BP Readings from Last 3 Encounters:  07/08/23 (!) 104/58  06/20/23 128/68  06/06/23 (!) 118/58     Physical Exam Vitals and nursing note reviewed.  Constitutional:      General: She is not in acute distress.    Appearance: Normal appearance. She is not ill-appearing.  HENT:     Head: Normocephalic.     Right Ear: External ear normal.     Left Ear: External ear normal.     Nose: No congestion.     Mouth/Throat:     Mouth: Mucous membranes are moist.     Pharynx: No  oropharyngeal exudate or posterior oropharyngeal erythema.  Eyes:     Extraocular Movements: Extraocular movements intact.     Conjunctiva/sclera: Conjunctivae normal.     Pupils: Pupils are equal, round, and reactive to light.    Cardiovascular:     Rate and Rhythm: Normal rate and regular rhythm.     Pulses: Normal pulses. No decreased pulses.     Heart sounds: Normal heart sounds. No murmur heard. Pulmonary:     Effort: Pulmonary effort is normal. No respiratory distress.     Breath sounds: Normal breath sounds. No wheezing.   Musculoskeletal:     Cervical back: Normal range of motion.     Right lower leg: No edema.     Left lower leg: No edema.     Comments: Atrophy muscles noted bilateral lower legs    Skin:    General: Skin is warm.     Findings: Rash (underneath both breasts) present. No lesion.   Neurological:     General: No focal deficit present.     Mental Status: She is alert and oriented to person, place, and time.     Cranial Nerves: No cranial nerve deficit.     Sensory: No sensory deficit.   Psychiatric:        Mood and Affect: Mood normal.        Behavior: Behavior normal.             Darris Staiger M Delanie Tirrell, PA-C

## 2023-07-08 NOTE — Patient Instructions (Signed)
  VISIT SUMMARY: During your visit, we addressed your severe pelvic pain, fluctuating blood sugar levels, a new rash under your breasts, and symptoms suggestive of a urinary tract infection. We also discussed your general health maintenance and follow-up appointments.  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 -Proceed with dry needling therapy as recommended by your pelvic floor physical therapist.  DIABETES MELLITUS: Your blood sugar levels have been fluctuating, and you experience symptoms when your levels are in the 90s. -Start Ozempic at a dose of 0.25 mg weekly. -Monitor your blood glucose levels closely. -Adjust your insulin  dosage based on blood glucose readings and hypoglycemic symptoms. -Keep a log of your blood glucose and blood pressure readings. -Ensure you have snacks available to manage hypoglycemia.  URINARY TRACT INFECTION (UTI): You have symptoms that suggest a urinary tract infection, such as burning during urination and increased frequency. -We will perform a urinalysis to confirm if you have a urinary tract infection.  INTERTRIGO: You have a rash under both breasts, likely due to a fungal infection. -We will prescribe an antifungal cream for you to apply under your breasts.  GENERAL HEALTH MAINTENANCE: You need to follow up on several health maintenance tasks. -Schedule an appointment with an ophthalmologist for your worsening vision and headaches. -Contact your oncology specialist for your yearly follow-up. -Monitor your white blood cell count.  FOLLOW-UP: You have several follow-up appointments and plans to extend your physical therapy sessions. -Extend your physical therapy sessions. -Follow up with your neurologist as scheduled. -Follow up with your endocrinologist in September.                      Contains text generated by [employer]bridge.                                  Contains text generated by [employer]bridge.

## 2023-07-10 ENCOUNTER — Ambulatory Visit: Payer: Self-pay

## 2023-07-10 NOTE — Telephone Encounter (Signed)
 Returned pt call; pt last seen in office on 07/08/23 by PCP; assisted pt with better understanding on dose and how to administer Ozempic; Pt thought she only counted a day once a week when injecting medication. Advised pt on what day she will increase to 0.5mg  dose. Pt verbalized understanding by repeating directions to me and advised she wrote dose and dates on calendar to administer Ozempic.

## 2023-07-10 NOTE — Telephone Encounter (Signed)
 FYI Only or Action Required?: Action required by provider: clinical question for provider.  Patient was last seen in primary care on 08/14/2022 by Lucius Krabbe, NP. Called Nurse Triage reporting medication question. Symptoms began today. Interventions attempted: Prescription medications: Ozempic. Symptoms are: N/A.  Triage Disposition: Call PCP When Office is Open  Patient/caregiver understands and will follow disposition?: Yes  **See note Below, Question about Ozempic**                 Copied from CRM [id number]. Topic: Clinical - Medical Advice >> Jul 10, 2023 10:14 AM Ivette P wrote: Reason for CRM: Pt is new user of Ozempic Reason for Disposition  [1] Caller has NON-URGENT medicine question about med that PCP prescribed AND [2] triager unable to answer question  Answer Assessment - Initial Assessment Questions 1. NAME of MEDICINE: What medicine(s) are you calling about? Ozempic   2. QUESTION: What is your question? (e.g., double dose of medicine, side effect)     Inject .25mg  once a week for 28 days, then 0.5mg  once a week  For 28 days? Its' giving her 2 different doses? Does she need the 0.5mg ? She states she is not sure she needs it.  Protocols used: Medication Question Call-A-AH

## 2023-07-11 LAB — URINE CULTURE
MICRO NUMBER:: 16618388
SPECIMEN QUALITY:: ADEQUATE

## 2023-07-12 ENCOUNTER — Other Ambulatory Visit (HOSPITAL_COMMUNITY): Payer: Self-pay

## 2023-07-14 ENCOUNTER — Ambulatory Visit

## 2023-07-14 DIAGNOSIS — R279 Unspecified lack of coordination: Secondary | ICD-10-CM | POA: Diagnosis not present

## 2023-07-14 DIAGNOSIS — M62838 Other muscle spasm: Secondary | ICD-10-CM | POA: Diagnosis not present

## 2023-07-14 DIAGNOSIS — R293 Abnormal posture: Secondary | ICD-10-CM

## 2023-07-14 DIAGNOSIS — M6281 Muscle weakness (generalized): Secondary | ICD-10-CM | POA: Diagnosis not present

## 2023-07-14 NOTE — Patient Instructions (Signed)

## 2023-07-14 NOTE — Therapy (Signed)
 OUTPATIENT PHYSICAL THERAPY FEMALE PELVIC TREATMENT   Patient Name: Felicia [name] MRN: [id number] DOB:08/27/1950, 73 y.o., female Today's Date: 07/14/2023  END OF SESSION:     Past Medical History:  Diagnosis Date   Adenomatous colon polyp 2015   Anemia    Anxiety    Breast cancer (HCC) 09/2019   left breast IMC   CAD (coronary artery disease)    a. 3/2007s/p DES to the LAD ([place] ); b. 01/2017 MV: EF 80%, small, mild apical ant defect w/o ischemia (felt to be breast atten). Low risk.   Depression    DM (diabetes mellitus) (HCC)    Emphysema lung (HCC)    Essential hypertension    GERD (gastroesophageal reflux disease)    Headache    secondary to neck surgery per patient   High triglycerides    HTN (hypertension)    Hyperlipidemia    Hypothyroid    OA (osteoarthritis)    right knee,hands   Overflow incontinence    PONV (postoperative nausea and vomiting)    s/p gallbladder    RLS (restless legs syndrome)    Urinary urgency    Uterine cancer (HCC) dx'd 2014   Past Surgical History:  Procedure Laterality Date   BREAST LUMPECTOMY WITH RADIOACTIVE SEED AND SENTINEL LYMPH NODE BIOPSY Left 10/07/2019   Procedure: LEFT BREAST LUMPECTOMY WITH RADIOACTIVE SEED AND SENTINEL LYMPH NODE BIOPSY;  Surgeon: Ebbie Cough, MD;  Location: [facility];  Service: General;  Laterality: Left;   CHOLECYSTECTOMY     CORONARY STENT PLACEMENT     CORONARY/GRAFT ACUTE MI REVASCULARIZATION     FINGER ARTHRODESIS Right 06/11/2019   Procedure: ARTHRODESIS INDEX FINGER DISTAL PHALANGEAL JOINT;  Surgeon: Murrell Drivers, MD;  Location: [facility];  Service: Orthopedics;  Laterality: Right;   FOOT SURGERY Left    LEFT HEART CATH AND CORONARY ANGIOGRAPHY N/A 02/16/2018   Procedure: LEFT HEART CATH AND CORONARY ANGIOGRAPHY;  Surgeon: Court Dorn PARAS, MD;  Location: [facility];  Service: Cardiovascular;  Laterality: N/A;   LEFT HEART CATH AND CORONARY  ANGIOGRAPHY N/A 05/01/2020   Procedure: LEFT HEART CATH AND CORONARY ANGIOGRAPHY;  Surgeon: Court Dorn PARAS, MD;  Location: [facility];  Service: Cardiovascular;  Laterality: N/A;   PORTACATH PLACEMENT Right 11/23/2019   Procedure: INSERTION PORT-A-CATH WITH ULTRASOUND GUIDANCE;  Surgeon: Ebbie Cough, MD;  Location:  SURGERY CENTER;  Service: General;  Laterality: Right;   REPLACEMENT TOTAL KNEE Right    SPINE SURGERY     TOTAL ABDOMINAL HYSTERECTOMY     FOR UTERUS CANCER   TOTAL HIP ARTHROPLASTY Left 2015   Patient Active Problem List   Diagnosis Date Noted   Disorder associated with type 2 diabetes mellitus (HCC) 06/20/2023   Restless leg syndrome 05/26/2023   Referred otalgia of left ear 11/19/2022   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   Erosive osteoarthritis of multiple sites 09/26/2021   Frequent headaches 05/31/2021   Bilateral impacted cerumen 02/22/2021   Nasal septal perforation 02/22/2021   Postnasal drip 02/22/2021   Hoarseness 01/25/2021   Hyperglycemia due to type 2 diabetes mellitus (HCC) 01/18/2021   Mixed simple and mucopurulent chronic bronchitis (HCC) 12/27/2020   Centrilobular emphysema (HCC) 07/31/2020   Shortness of breath 07/31/2020   Tongue lesion 04/30/2020   Hypothyroid    Anxiety    Anxiety and depression    Port-A-Cath in place 11/24/2019   Hepatic steatosis 09/28/2019   Malignant neoplasm of  upper-outer quadrant of left breast in female, estrogen receptor positive (HCC) 09/02/2019   Trigger index finger of right hand 05/05/2019   Primary osteoarthritis of first carpometacarpal joint of right hand 05/05/2019   Osteoarthritis of finger of right hand 05/05/2019   Endometrial cancer (HCC) 10/23/2018   Cirrhosis of liver without ascites (HCC) 10/23/2018   Diarrhea 10/23/2018   Chest pain 02/15/2018   Coronary artery disease involving native coronary artery of native heart without angina  pectoris 11/18/2017   Hyperlipidemia LDL goal <70 11/18/2017   Diabetes mellitus treated with insulin  and oral medication (HCC) 11/18/2017   Dizziness 11/18/2017   Type 2 diabetes mellitus without complication, without long-term current use of insulin  (HCC) 11/18/2017    PCP: Kathrene Mardy HERO, PA-C  REFERRING PROVIDER: Craig Alan SAUNDERS, PA-C   REFERRING DIAG: K52.9 (ICD-10-CM) - Chronic diarrhea  THERAPY DIAG:  Muscle weakness (generalized)  Unspecified lack of coordination  Abnormal posture  Other muscle spasm  Rationale for Evaluation and Treatment: Rehabilitation  ONSET DATE: 3 years ago  SUBJECTIVE:                                                                                                                                                                                           SUBJECTIVE STATEMENT: Pt did have a UTI and is on antibiotics. She also had several other things abnormal about urine, including blood in her urine. She said that she is feeling good today. She feels like her bladder control is getting better again now that UTI is better. She is down to 2 pull ups a day from urinary leaking.   PAIN: 07/14/23 Are you having pain? Yes NPRS scale: 5/10 Pain location: hips, low back, LE  Pain type: aching Pain description: constant   Aggravating factors: walking, standing  Relieving factors: lying down   PRECAUTIONS: Other: hx of uterine and breast cancer   RED FLAGS: None   WEIGHT BEARING RESTRICTIONS: No  FALLS:  Has patient fallen in last 6 months? Yes. Number of falls 1x, 12/20/23 (tripped over curb)  OCCUPATION: not working  ACTIVITY LEVEL : none currently  PLOF: Independent  PATIENT GOALS: control over bowel movements   PERTINENT HISTORY:  Uterine  cancer with vaginal radiation 2014, invasive mammary carcinoma Lt 2021 with lumpectomy and chemoradiation, anastrozole , CAD with stent 2018, emphysema, cirrhosis, type 2 diabetes,  cholecystectomy, Rt TKA, Lt THA, total abdominal hysterectomy (from cancer)   BOWEL MOVEMENT: Pain with bowel movement: No - states that she hardly feels it Type of bowel movement:Type (Bristol Stool Scale) 5-7, Frequency many times a day, usually small amounts every time she urinates,  and Strain no - just comes out Fully empty rectum: No Leakage: Yes: unaware, with passing gas Pads: Yes: 3 briefs on bad days (day and night) Fiber supplement/laxative Yes and Miralax   URINATION: Pain with urination: No Fully empty bladder: Yes:   Stream: Strong Urgency: Yes  Frequency: over 3 or 4 hours; no nocturia Leakage: Urge to void, Walking to the bathroom, and standing up Pads: Yes: 3 briefs a day/night  INTERCOURSE:  Nor sexually active and no history of pain  PREGNANCY: Vaginal deliveries 1 Tearing No Episiotomy No C-section deliveries 1 Currently pregnant No  PROLAPSE: None   OBJECTIVE:  Note: Objective measures were completed at Evaluation unless otherwise noted. 07/14/23               Internal Pelvic Floor: vaginal stenosis, scar tissue restriction, decreased mobility; improved coordination in rectum with increased sensation, but still difficulty with consistent contraction on her own without cues - cue of hugging finger was most helpful and she was able to consistently squeeze  Patient confirms identification and approves PT to assess internal pelvic floor and treatment Yes  PELVIC MMT:   MMT 07/14/23  Vaginal 1/5, 5 repeat contractions, 5 second hold  Internal Anal Sphincter 2/5 with cuing  External Anal Sphincter 2/5 with cuing  Puborectalis 2/5 with cuing  (Blank rows = not tested)        TONE: low  PROLAPSE: None detected, but difficulty with coordination of appropriate bearing down  04/23/23: PATIENT SURVEYS:   PFIQ-7: 67  COGNITION: Overall cognitive status: Within functional limits for tasks assessed     SENSATION: Light touch: Appears  intact  FUNCTIONAL TESTS:  Squat: bil valgus knee collapse Single leg stance:  Rt: compensated trendelenburg with UE support  Lt: pelvic drop with UE support Curl-up test: significant abdominal distortion    GAIT: Assistive device utilized: None Comments: Rt antalgic pattern, decreased bil hip extension  POSTURE: rounded shoulders, forward head, decreased lumbar lordosis, increased thoracic kyphosis, and posterior pelvic tilt   LUMBARAROM/PROM:  A/PROM A/PROM  Eval (% available)  Flexion 75  Extension 50  Right lateral flexion 50  Left lateral flexion 50  Right rotation 50  Left rotation 50   (Blank rows = not tested)   PALPATION:   General: significant tightness in bil obliques and lumbar paraspinals   Abdominal: abdominal distention and tightness; scar tissue                  External Perineal Exam: dry                             Internal Pelvic Floor: vaginal stenosis, scar tissue restriction, decreased mobility; decreased rectal sensation; decreased ability to create appropriate intra-rectal pressure when attempting to push like during bowel movement   Patient confirms identification and approves PT to assess internal pelvic floor and treatment Yes  PELVIC MMT:   MMT eval  Vaginal No active contraction  Internal Anal Sphincter No active contraction  External Anal Sphincter 1/5  Puborectalis 1/5  Diastasis Recti 4 finger widths   (Blank rows = not tested)        TONE: low  PROLAPSE: None detected, but difficulty with coordination of appropriate bearing down   TODAY'S TREATMENT:  DATE:  07/14/23 RE-EVAL Manual: Pt provides verbal consent for internal vaginal/rectal pelvic floor exam. Internal pelvic floor muscle reassessment, vaginal and rectal Neuromuscular re-education: Internal vaginal and rectal pelvic floor muscle  contraction training Urge drill Long holds Quick flicks Therapeutic activities: Urge drill Squatty potty HEP updates: Counter squats 3 way kick Heel raises Bridges Leg extensions   07/07/23 Manual: Soft tissue mobilization/trigger point release to Rt glutes and lumbar paraspinals  Neuromuscular re-education: Unilateral side lying UE ball press with transversus abdominus and pelvic floor muscle contractions and breath coordination 10x Side lying clam shells 10x bil Bridge with hip adduction, transversus abdominus, and pelvic floor muscle 10x Supine hip abduction red band 10x   06/30/23 Manual: In Lt side lying, Rt glute trigger point release and soft tissue mobilization  Neuromuscular re-education: Bridge with hip adduction, transversus abdominus, and pelvic floor muscle 2 x 10 Supine hip abduction red band 2 x 10 Exercises: Open books 6x Lower trunk rotation 2 x 10 Single knee to chest 5x   PATIENT EDUCATION:  Education details: See above Person educated: Patient Education method: Programmer, multimedia, Demonstration, Tactile cues, Verbal cues, and Handouts Education comprehension: verbalized understanding  HOME EXERCISE PROGRAM: [id number]  ASSESSMENT:  CLINICAL IMPRESSION: Pt has made some progress with loose stool and fecal incontinence; she reports more than 25% improvement, but not quite 75% yet. She does feel like she has seen greater than 75% improvement with fecal incontinence with passing has. She has decreased use of briefs from 4 a day to 2 a day from improving urinary incontinence. She is doing well with exercises at home and HEP updated this session. Her hip pain has improved some with manual techniques and she would still like to try dry needling in future sessions if she continues to have pain. Pelvic floor muscle exam shows good progress with vaginal and rectal muscle strength and coordination, but she still is struggling with full external anal sphincter  contraction and requires verbal cues. She was encouraged to use her finger in rectum to give her more information on if she is getting appropriate contraction since she is having difficulty determining when she is squeezing v relaxing. Overall making good progress. She will continue to benefit from skilled PT intervention in order to improve fecal and urinary incontinence, progress functional strengthening program, and improve quality of life.   OBJECTIVE IMPAIRMENTS: decreased activity tolerance, decreased coordination, decreased endurance, decreased mobility, decreased ROM, decreased strength, increased fascial restrictions, increased muscle spasms, impaired tone, postural dysfunction, and pain.   ACTIVITY LIMITATIONS: continence  PARTICIPATION LIMITATIONS: community activity  PERSONAL FACTORS: 3+ comorbidities: medical history are also affecting patient's functional outcome.   REHAB POTENTIAL: Good  CLINICAL DECISION MAKING: Evolving/moderate complexity  EVALUATION COMPLEXITY: Moderate   GOALS: Goals reviewed with patient? Yes  SHORT TERM GOALS: Updated 07/14/23   Pt will be independent with HEP.   Baseline: Goal status: MET 07/14/23  2.  Pt will report 25% improvement in fecal incontinence.  Baseline:  Goal status: MET 07/14/23  3.  Pt will be able to teach back and utilize urge suppression technique in order to help reduce number of trips to the bathroom.    Baseline:  Goal status: MET 07/14/23  4.  Pt will decrease number of bowel movements to no more than 3 a day in order to decrease skin irritation and demonstrate more complete emptying.  Baseline: Pt states that doing miralax  and fiber, she is seeing some good progress and has one bowel movements a day  with possibly a second. Goal status: MET 07/14/23  5.  Pt will be independent with use of squatty potty, relaxed toileting mechanics, and improved bowel movement techniques in order to increase ease of bowel movements and  complete evacuation so she can decrease leaking.   Baseline:  Goal status: IN PROGRESS 07/14/23   LONG TERM GOALS: Updated 07/14/23  Pt will be independent with advanced HEP.   Baseline:  Goal status: IN PROGRESS 07/14/23  2.  Pt will report 75% improvement in fecal incontinence.  Baseline:  Goal status: IN PROGRESS 07/14/23  3.  Pt will be able to pass gas without fecal incontinence in order to decrease number of briefs she is using daily.  Baseline: has improved improved - feels like this is over 75% better Goal status: IN PROGRESS 07/14/23  4.  Pt will report no more than 1 brief a day in order to decrease amount she is having to buy and demonstrate improved bowel control.  Baseline: She is only using 2 a day now. Down from 3-4 Goal status: IN PROGRESS 07/14/23  5.  Pt will demonstrate normal pelvic floor muscle tone and A/ROM, able to achieve 4/5 strength with contractions and 10 sec endurance, in order to provide appropriate lumbopelvic support in functional activities.   Baseline:  Goal status: IN PROGRESS 07/14/23  6.  Pt will be able to go 2-3 hours in between voids without urgency or incontinence in order to improve QOL and perform all functional activities with less difficulty.   Baseline:  Goal status: IN PROGRESS 07/14/23  PLAN:  PT FREQUENCY: 1-2x/week  PT DURATION: 12 weeks  PLANNED INTERVENTIONS: 97110-Therapeutic exercises, 97530- Therapeutic activity, 97112- Neuromuscular re-education, 97535- Self Care, [id number]- Manual therapy, Dry Needling, and Biofeedback  PLAN FOR NEXT SESSION: Return to internal rectal exam to work on biofeedback of pelvic floor muscle contraction; biofeedback; hip/core exercises.    Josette Mares, PT, DPT06/30/253:38 PM

## 2023-07-15 ENCOUNTER — Ambulatory Visit: Payer: Self-pay | Admitting: Physician Assistant

## 2023-07-16 ENCOUNTER — Encounter: Payer: Self-pay | Admitting: Oncology

## 2023-07-16 ENCOUNTER — Other Ambulatory Visit: Payer: Self-pay

## 2023-07-16 ENCOUNTER — Other Ambulatory Visit: Payer: Self-pay | Admitting: Physician Assistant

## 2023-07-16 ENCOUNTER — Other Ambulatory Visit (HOSPITAL_COMMUNITY): Payer: Self-pay

## 2023-07-16 DIAGNOSIS — B349 Viral infection, unspecified: Secondary | ICD-10-CM | POA: Diagnosis not present

## 2023-07-16 DIAGNOSIS — R079 Chest pain, unspecified: Secondary | ICD-10-CM | POA: Diagnosis not present

## 2023-07-16 DIAGNOSIS — J40 Bronchitis, not specified as acute or chronic: Secondary | ICD-10-CM | POA: Diagnosis not present

## 2023-07-16 DIAGNOSIS — J439 Emphysema, unspecified: Secondary | ICD-10-CM | POA: Diagnosis not present

## 2023-07-16 DIAGNOSIS — T17908A Unspecified foreign body in respiratory tract, part unspecified causing other injury, initial encounter: Secondary | ICD-10-CM | POA: Diagnosis not present

## 2023-07-16 MED ORDER — GLIPIZIDE ER 10 MG PO TB24
20.0000 mg | ORAL_TABLET | Freq: Every morning | ORAL | 1 refills | Status: DC
  Filled 2023-07-16: qty 90, 45d supply, fill #0
  Filled 2023-08-25: qty 90, 45d supply, fill #1

## 2023-07-16 NOTE — Progress Notes (Signed)
 SUBJECTIVE:  Felicia [name] is a 73 y.o. female with history of emphysema, GERD, hypertension, hypothyroidism, previous MI with and diabetes who presents to the clinic with a 3-day history of chest pressure that started after she swallowed a piece of toast that got caught in her throat causing quite a bit of time before she could get the piece of toast to fully pass.  She denies any sharp substernal pain and states she started having left posterior shoulder pain along with pressure underneath her left breast which she initially thought was due to phantom pain from her lumpectomy that was done in 2021.  Patient denies any fever, chills or diaphoresis however feels warm to touch.     Parts of patient history reviewed include PMH, problem list, medications, allergies, social history, family history, and surgical history.  OBJECTIVE: ROS:  General: Alert and oriented Skin: Denies concerns ENT: Episode of dysphagia 3 days ago.  Reports rhinorrhea and mild congestion.  Denies sore throat Neck: Range of motion Lungs: Reports cough that is nonproductive, dyspnea and nighttime wheezing x 1 day Heart: Denies palpitations, reports chest pressure 2 out of 10 generalized Neurologic: Alert and oriented  abdomen: Denies nausea, vomiting or abdominal pain  Vitals:   07/16/23 1831 07/16/23 1904  BP: 146/86 121/63  BP Location: Right arm   Patient Position: Sitting   Pulse: 73 89  Resp: 20   Temp: 99 F (37.2 C)   TempSrc: Tympanic   SpO2: 97%   Weight: 83.5 kg (184 lb)   Height: 1.626 m (5' 4)      General: Patient is well-nourished and in no acute distress.  Is warm to the touch with no diaphoresis noted does have flushing of cheeks Skin:  No rash noted. Head:  Normocephalic, atraumatic.   Eyes: EOM intact Ears:  Gross hearing intact.  Canals without erythema or swelling.  TMs bilateral clear fluid Nose/Sinuses:  Nares edema with clear nasal drainage noted.   Mouth/pharynx: Gingiva and mucosa  pink.  No erythema or exudate bilaterally.  Clear postnasal drainage noted Neck: NROM Lungs: Lung sounds bilaterally with coarse rhonchi and inspiratory and expiratory wheezing.  No accessory muscle use Heart: NRR with no murmurs, rubs, or gallops.  Left-sided chest pressure is reproducible Neurologic: Alert and oriented Abdomen: Soft, nontender  Narrative & Impression  XR CHEST 2 VIEWS, 07/16/2023 6:51 PM   INDICATION: Chest pain, unspecified \ R07.9 Chest pain, unspecified  COMPARISON: None   FINDINGS:    Cardiovascular: Cardiac silhouette and pulmonary vasculature are within normal limits. Mediastinum: Within normal limits. Lungs/pleura: Central peribronchial thickening. No pulmonary consolidation or edema. Streaky bibasilar subsegmental atelectasis. No pleural effusion or pneumothorax. Upper abdomen: Visualized portions are unremarkable.  Chest wall/osseous structures: Incompletely included inferior cervical posterior instrumented fusion hardware. No acute fracture. Polyarticular degenerative changes.     IMPRESSION: Nonspecific central peribronchial thickening, although concerning for viral respiratory illness, or bronchitis-bronchiolitis. No pulmonary consolidation.     I have personally reviewed the procedure note and/or have reviewed and interpreted this image/images. Electronically Signed By: Rankin Ina Slice, MD on 07/16/2023  7:08 PM  ASSESSMENT:   1. Bronchitis      2. Chest pain, unspecified type  ECG 12 lead   XR Chest 2 Views   ECG 12 lead   nitroglycerin  (NITROSTAT ) SL tablet 0.4 mg    3. Aspiration into respiratory tract, initial encounter      4. Acute viral syndrome      5. Pulmonary emphysema, unspecified emphysema  type    (CMD)        PLAN:  Discussed likely viral bronchitis however with the knowledge that she had an episode of dysphagia with a food bolus 3 days prior high suspicion for aspiration as underlying cause.  History of emphysema at  baseline will prophylactically treat with antibiotics due to low-grade fever here in the urgent care setting.  Nitro was given with no change to chest pressure symptoms.  Patient has no desire to go to the emergency room for cardiac workup however she was advised on the potential likelihood with her history that there is an underlying cardiac component.  Did advise ER follow-up which patient did fine while in the clinic.  Blood pressure is well-controlled and with the pressure being reproducible we will plan to treat for underlying bronchitis as noted in the x-ray report with close follow-up discussed along with signs and symptoms that would prompt evaluation in the emergency room.  He was done in the clinic with no changes in comparison to previous EKG readings and no ST elevation or depression.  Left axis deviation likely due to underlying emphysema diagnosis and does correlate with previous EKG readings.  Patient was advised to use over-the-counter Tylenol  for fever control and was also advised to use her albuterol  inhaler that she is currently prescribed every 4 hours while awake for additional symptom relief.  Unable to treat with NSAID therapy as she is currently on Celebrex  so she was advised to take as scheduled.  Did advise her to follow-up with PCP in 3 to 5 days. Call or return with any questions or concerns Spoke at length about changing or worsening of symptoms that should prompt going directly to the closest ER....  Patient is agreeable with assessment and plan.  Urgent Care Disposition:  Follow up with PCP  Patient's presentation is most consistent with acute complicated illness / injury requiring diagnostic workup.    Electronically signed by: Charleen Tammy Lot, NP 07/16/2023 8:14 PM

## 2023-07-19 DIAGNOSIS — R079 Chest pain, unspecified: Secondary | ICD-10-CM | POA: Diagnosis not present

## 2023-07-20 ENCOUNTER — Encounter: Payer: Self-pay | Admitting: Physician Assistant

## 2023-07-21 ENCOUNTER — Other Ambulatory Visit: Payer: Self-pay

## 2023-07-21 ENCOUNTER — Telehealth: Payer: Self-pay | Admitting: Family Medicine

## 2023-07-21 DIAGNOSIS — M25571 Pain in right ankle and joints of right foot: Secondary | ICD-10-CM

## 2023-07-21 DIAGNOSIS — S82831A Other fracture of upper and lower end of right fibula, initial encounter for closed fracture: Secondary | ICD-10-CM | POA: Diagnosis not present

## 2023-07-21 DIAGNOSIS — W19XXXA Unspecified fall, initial encounter: Secondary | ICD-10-CM | POA: Diagnosis not present

## 2023-07-21 NOTE — Telephone Encounter (Signed)
 Forwarding to Dr. Denyse Amass to review and advise.

## 2023-07-21 NOTE — Telephone Encounter (Signed)
 Updated previous telephone encounter and forwarded to Dr. Joane to review and advise.

## 2023-07-21 NOTE — Telephone Encounter (Signed)
 Patient fell Friday night and thinks she sprained her ankle. As we did not have an open appointment today she stated she was going to go to urgent care and asked that I still send a message to let Dr. Joane know. FYI

## 2023-07-21 NOTE — Telephone Encounter (Signed)
 Felicia [name]   07/21/23 12:58 PM Note Pt went to [facility] today after injuring her ankle. Pt was informed that ankle is broken and she needs to see an Ortho this week.   Pt would like to know which Ortho Dr. Joane would recommend.

## 2023-07-21 NOTE — Telephone Encounter (Signed)
 Pt went to [facility] today after injuring her ankle. Pt was informed that ankle is broken and she needs to see an Ortho this week.  Pt would like to know which Ortho Dr. Joane would recommend.

## 2023-07-22 ENCOUNTER — Other Ambulatory Visit: Payer: Self-pay

## 2023-07-22 ENCOUNTER — Other Ambulatory Visit (HOSPITAL_COMMUNITY): Payer: Self-pay

## 2023-07-22 MED ORDER — ROSUVASTATIN CALCIUM 40 MG PO TABS
40.0000 mg | ORAL_TABLET | Freq: Every day | ORAL | 3 refills | Status: AC
  Filled 2023-07-22: qty 90, 90d supply, fill #0
  Filled 2023-10-14: qty 90, 90d supply, fill #1
  Filled 2024-01-12: qty 90, 90d supply, fill #2

## 2023-07-22 NOTE — Telephone Encounter (Signed)
 [facility] we would be able to do this just fine.  Do you need a referral?  I am sorry to hear about your ankle.

## 2023-07-22 NOTE — Patient Outreach (Signed)
 07/21/2023  Called patient at our scheduled time of appt. (07/21/23 @ 2pm) but discovered patient was in the process of being followed up for a broken foot. Patient stated she was not free to talk at the time, confirmed she had already had X-rays taken, was safe and would discuss details with me later. Rescheduled appt for 07/22/2023 @ 1pm.  Channing A. Gordy RN, BA, [facility], CRRN [place]  [facility] Population Health RN Care Manager Direct Dial: [phone]  Fax: [phone]

## 2023-07-22 NOTE — Addendum Note (Signed)
 Addended by: MARDY LEOTIS RAMAN on: 07/22/2023 01:17 PM   Modules accepted: Orders

## 2023-07-23 NOTE — Patient Outreach (Signed)
 Complex Care Management   Visit Note  07/22/2023  Name:  Felicia [name] MRN: [id number] DOB: 05/19/1950  Situation: Referral received for Complex Care Management related to Diabetes with Complications and Frequent Falls/High Risk for Falls. I obtained verbal consent from Patient.  Visit completed with patient  on the phone  Background:   Past Medical History:  Diagnosis Date   Adenomatous colon polyp 2015   Anemia    Anxiety    Breast cancer (HCC) 09/2019   left breast IMC   CAD (coronary artery disease)    a. 3/2007s/p DES to the LAD ([place] ); b. 01/2017 MV: EF 80%, small, mild apical ant defect w/o ischemia (felt to be breast atten). Low risk.   Depression    DM (diabetes mellitus) (HCC)    Emphysema lung (HCC)    Essential hypertension    GERD (gastroesophageal reflux disease)    Headache    secondary to neck surgery per patient   High triglycerides    HTN (hypertension)    Hyperlipidemia    Hypothyroid    OA (osteoarthritis)    right knee,hands   Overflow incontinence    PONV (postoperative nausea and vomiting)    s/p gallbladder    RLS (restless legs syndrome)    Urinary urgency    Uterine cancer (HCC) dx'd 2014    Assessment: Patient Reported Symptoms:  Cognitive Cognitive Status: Normal speech and language skills, Insightful and able to interpret abstract concepts, Alert and oriented to person, place, and time Cognitive/Intellectual Conditions Management [RPT]: None reported or documented in medical history or problem list   Health Maintenance Behaviors: Annual physical exam, Exercise, Healthy diet Healing Pattern: Unsure Health Facilitated by: Pain control, Healthy diet, Rest  Neurological Neurological Review of Symptoms: No symptoms reported Neurological Self-Management Outcome: 4 (good)  HEENT HEENT Symptoms Reported: No symptoms reported HEENT Management Strategies: Routine screening HEENT Self-Management Outcome: 4 (good)    Cardiovascular  Cardiovascular Symptoms Reported: No symptoms reported Does patient have uncontrolled Hypertension?: No Cardiovascular Management Strategies: Medication therapy Cardiovascular Self-Management Outcome: 4 (good)  Respiratory Respiratory Symptoms Reported: No symptoms reported Respiratory Self-Management Outcome: 4 (good)  Endocrine Endocrine Symptoms Reported: Other Other symptoms related to hypoglycemia or hyperglycemia: Patient saw PCP on 6/24 to review fluctuating and elevating blood sugars. PCP added Ozempic  0.25 mg weekly to current diabetic regimen Is patient diabetic?: Yes Is patient checking blood sugars at home?: Yes Endocrine Self-Management Outcome: 3 (uncertain)  Gastrointestinal Gastrointestinal Symptoms Reported: No symptoms reported Gastrointestinal Management Strategies: Diet modification, Coping strategies Gastrointestinal Self-Management Outcome: 4 (good) Nutrition Risk Screen (CP): No indicators present  Genitourinary Genitourinary Symptoms Reported: No symptoms reported    Integumentary Integumentary Symptoms Reported: No symptoms reported    Musculoskeletal Musculoskelatal Symptoms Reviewed: Unsteady gait, Difficulty walking, Weakness Other Musculoskeletal Symptoms: [place] walking up stairs to the 2nd floor of her home on 7/4, injured ankle - Xrays indicated a fracture of a minor ankle bone, patient said the UC MD stated it was a bone that is not needed for walking/weight bearing - patient's right foot placed in a boot - referral to Orthopedic MD made Musculoskeletal Management Strategies: Medical device, Medication therapy, Adequate rest, Coping strategies, Exercise Musculoskeletal Self-Management Outcome: 3 (uncertain) Musculoskeletal Comment: Most recent fall w/ injury occurred on 7/4 Falls in the past year?: Yes Number of falls in past year: 2 or more Was there an injury with Fall?: Yes Fall Risk Category Calculator: 3 Patient Fall Risk Level: High Fall  Risk Patient at Risk  for Falls Due to: History of fall(s), Impaired balance/gait, Impaired mobility Fall risk Follow up: Falls evaluation completed, Follow up appointment, Falls prevention discussed  Psychosocial Psychosocial Symptoms Reported: No symptoms reported     Quality of Family Relationships: involved, helpful, supportive Do you feel physically threatened by others?: No      07/08/2023    1:44 PM  Depression screen PHQ 2/9  Decreased Interest 2  Down, Depressed, Hopeless 1  PHQ - 2 Score 3  Altered sleeping 2  Tired, decreased energy 2  Change in appetite 2  Feeling bad or failure about yourself  2  Trouble concentrating 2  Moving slowly or fidgety/restless 1  Suicidal thoughts 0  PHQ-9 Score 14  Difficult doing work/chores Somewhat difficult    There were no vitals filed for this visit.  Medications Reviewed Today     Reviewed by Gordy Channing LABOR, RN (Registered Nurse) on 07/23/23 at 0131  Med List Status: <None>   Medication Order Taking? Sig Documenting Provider Last Dose Status Informant  ACCU-CHEK GUIDE test strip [id number] Yes USE TO check blood glucose UP TO four times daily AS DIRECTED Allwardt, Alyssa M, PA-C  Active   Accu-Chek Softclix Lancets lancets [id number] Yes USE TO check blood glucose UP TO four times daily AS DIRECTED Allwardt, Alyssa M, PA-C  Active   acetaminophen  (TYLENOL ) 500 MG tablet [id number] Yes Take 500 mg by mouth every 6 (six) hours as needed for mild pain (pain score 1-3) or moderate pain (pain score 4-6). [provider]  Active   albuterol  (VENTOLIN  HFA) 108 (90 Base) MCG/ACT inhaler [id number] Yes Inhale 2 puffs into the lungs every 6 (six) hours as needed for wheezing or shortness of breath. Kassie Acquanetta Bradley, MD  Active   anastrozole  (ARIMIDEX ) 1 MG tablet [id number] Yes Take 1 tablet (1 mg total) by mouth daily. Gudena, Vinay, MD  Active   Ascorbic Acid (VITAMIN C PO) [id number] Yes Take 1 tablet by mouth daily. [provider]  Active Self  aspirin  EC (ASPIRIN  81) 81 MG tablet [id number] Yes Take 1 tablet (81 mg total) by mouth in the morning. Allwardt, Alyssa M, PA-C  Active   azelastine  (ASTELIN ) 0.1 % nasal spray [id number] Yes Place 2 sprays into both nostrils 2 (two) times daily. Use in each nostril as directed Allwardt, Alyssa M, PA-C  Active   bisoprolol  (ZEBETA ) 5 MG tablet [id number] Yes Take 1 tablet (5 mg total) by mouth at bedtime. Nahser, Aleene PARAS, MD  Active   blood glucose meter kit and supplies [id number] Yes Dispense based on patient and insurance preference. Use up to four times daily as directed. (FOR ICD-10 E10.9, E11.9). Allwardt, Alyssa M, PA-C  Active   buPROPion  (WELLBUTRIN  XL) 150 MG 24 hr tablet [id number] Yes Take 1 tablet (150 mg total) by mouth every morning for depression. Allwardt, Alyssa M, PA-C  Active   celecoxib  (CELEBREX ) 200 MG capsule [id number] Yes Take 1 capsule (200 mg total) by mouth 2 (two) times daily. Allwardt, Alyssa M, PA-C  Active   CINNAMON PO [id number] Yes Take 1 capsule by mouth daily with supper. [provider]  Active Self  clotrimazole -betamethasone  (LOTRISONE) cream [id number] Yes Apply 1 Application topically 2 (two) times daily. [provider]  Active   diclofenac  sodium (VOLTAREN ) 1 % GEL [id number] Yes Apply 2-4 g topically 4 (four) times daily as needed (as directed for pain). [provider]  Active Self  empagliflozin  (JARDIANCE ) 25 MG TABS tablet [id number] Yes  Take 1 tablet (25 mg total) by mouth daily. Allwardt, Alyssa M, PA-C  Active   escitalopram  (LEXAPRO ) 20 MG tablet [id number] Yes Take 1 tablet (20 mg total) by mouth daily. Allwardt, Alyssa M, PA-C  Active   fenofibrate  (TRICOR ) 145 MG tablet [id number] Yes Take 1 tablet (145 mg total) by mouth at bedtime. Nahser, Aleene PARAS, MD  Active   Ferrous Sulfate (IRON PO) [id number] Yes Take 1 tablet by mouth daily. alternates days:1 tablet one day and 2 tablets the next day [provider]  Active Self  gabapentin  (NEURONTIN ) 600 MG tablet [id number] Yes Take 1 tablet (600 mg total) by mouth 3 (three) times daily. Skeet Juliene SAUNDERS, DO  Active   glipiZIDE  (GLUCOTROL  XL) 10 MG 24 hr tablet [id number] Yes Take 2 tablets (20 mg total) by mouth every morning. Allwardt, Mardy HERO, PA-C  Active   glucose blood test strip [id number] Yes Use to check blood sugar up to four times daily as directed Allwardt, Alyssa M, PA-C  Active   HYDROcodone -acetaminophen  (NORCO/VICODIN) 5-325 MG tablet [id number] Yes Take 1 tablet by mouth every 6 (six) hours as needed. Joane Artist RAMAN, MD  Active   Incontinence Supply Disposable (DEPEND ADJUSTABLE UNDERWEAR) MISC [id number] Yes 1 Application by Does not apply route daily. Allwardt, Alyssa M, PA-C  Active   insulin  glargine (LANTUS  SOLOSTAR) 100 UNIT/ML Solostar Pen [id number] Yes Start with injecting 4 units under the skin in the evenings. Increase by 2 units every 2 days until monring fasting glucose is to goal (140). Max units 60 per day. Allwardt, Mardy HERO, PA-C  Active   Insulin  Pen Needle (INSUPEN PEN NEEDLES) 32G X 4 MM MISC [id number] Yes Use to inject insulin  as directed Allwardt, Alyssa M, PA-C  Active   ketoconazole  (NIZORAL ) 2 % cream [id number] Yes Apply 1 Application topically daily. Allwardt, Mardy HERO, PA-C  Active   Lancets MISC [id number] Yes Use to check blood sugar up to four times daily as directed Allwardt, Mardy HERO, PA-C  Active   levothyroxine  (SYNTHROID ) 125 MCG tablet [id number] Yes Take 1 tablet (125 mcg total) by mouth daily before breakfast. Allwardt, Alyssa M, PA-C  Active   loratadine  (ALLERGY RELIEF) 10 MG tablet [id number] Yes Take 1 tablet (10 mg total) by mouth every evening. Allwardt, Alyssa M, PA-C  Active   magnesium gluconate (MAGONATE) 500 (27 Mg) MG TABS tablet [id number] Yes Take 500 mg by mouth daily. [provider]  Active   Multiple Vitamins-Calcium  (ONE-A-DAY WOMENS PO) [id number] Yes Take 1 tablet by mouth  daily. [provider]  Active Self  mupirocin  cream (BACTROBAN ) 2 % [id number] Yes Apply 1 Application topically 3 (three) times daily. Allwardt, Mardy HERO, PA-C  Active   nitroGLYCERIN  (NITROSTAT ) 0.4 MG SL tablet [id number] Yes Dissolve 1 tablet under the tongue every 5 minutes as needed for chest pain. Max of 3 doses, then 911. Nahser, Aleene PARAS, MD  Active   omeprazole  (PRILOSEC) 40 MG capsule [id number] Yes Take 1 capsule (40 mg total) by mouth every evening. Allwardt, Mardy HERO, PA-C  Active   polyethylene glycol powder (MIRALAX ) 17 GM/SCOOP powder [id number] Yes Take 17 g by mouth daily. Craig Alan SAUNDERS DEVONNA  Active   Potassium 99 MG TABS [id number] Yes Take 1 tablet by mouth daily. [provider]  Active   rOPINIRole  (REQUIP ) 1 MG tablet [id number] Yes Take 1 tablet (1 mg total) by mouth at bedtime. Corey, Evan S, MD  Active   rosuvastatin  (CRESTOR ) 40  MG tablet [id number] Yes Take 1 tablet (40 mg total) by mouth daily. Mona Vinie BROCKS, MD  Active   Semaglutide ,0.25 or 0.5MG /DOS, (OZEMPIC , 0.25 OR 0.5 MG/DOSE,) 2 MG/3ML SOPN [id number] Yes Inject 0.25 mg into the skin once a week for 28 days, THEN 0.5 mg once a week for 28 days. Allwardt, Mardy HERO, PA-C  Active   Tiotropium Bromide-Olodaterol (STIOLTO RESPIMAT ) 2.5-2.5 MCG/ACT AERS [id number] Yes Inhale 2 puffs into the lungs daily. Kassie Acquanetta Bradley, MD  Active   tiZANidine  (ZANAFLEX ) 2 MG tablet [id number] Yes Take 1 tablet (2 mg total) by mouth 3 (three) times daily. Skeet Juliene SAUNDERS, DO  Active   traMADol  (ULTRAM ) 50 MG tablet [id number] Yes Take 1 tablet (50 mg total) by mouth every 6 (six) hours as needed for moderate pain (pain score 4-6) or severe pain (pain score 7-10). Allwardt, Alyssa M, PA-C  Active   triamcinolone  (KENALOG ) 0.1 % paste [id number] Yes Apply a small amount to affected area twice daily for 2 - 3 weeks.   Active   TURMERIC PO [id number] Yes Take 1 capsule by mouth daily with supper. [provider]  Active  Self  Wheat Dextrin [facility]) POWD [id number] Yes 1-2 TBSP work up to 3 x a day, mix in water or liquid Craig Alan SAUNDERS, [place]  Active             Recommendation:   Specialty provider follow-up with Orthopedic MD regarding recent fall with injury to Right ankle  Follow Up Plan:   Telephone follow up appointment date/time:  08/05/23 @ 2 PM  Raziya Aveni A. Gordy RN, BA, [facility], CRRN [place]  [facility] Population Health RN Care Manager Direct Dial: [phone]  Fax: [phone]

## 2023-07-23 NOTE — Telephone Encounter (Signed)
 Pt scheduled with Rocky Hans 07/29/23.

## 2023-07-23 NOTE — Patient Instructions (Signed)
 Visit Information  Thank you for taking time to visit with me today. Please don't hesitate to contact me if I can be of assistance to you before our next scheduled telephone appointment.  Our next appointment is by telephone on 08/05/23 at 2 PM  Following is a copy of your care plan:   Goals Addressed             This Visit's Progress    VBCI RN Care Plan       Problems:  Chronic Disease Management support and education needs related to DMII  Goal: Over the next 3 months the Patient will continue to work with RN Care Manager and/or Social Worker to address care management and care coordination needs related to DMII as evidenced by adherence to CM Team Scheduled appointments      Interventions:   Diabetes Interventions: Assessed patient's understanding of A1c goal: <7% Provided education to patient about basic DM disease process Reviewed medications with patient and discussed importance of medication adherence Counseled on importance of regular laboratory monitoring as prescribed Discussed plans with patient for ongoing care management follow up and provided patient with direct contact information for care management team Advised patient, providing education and rationale, to check cbg daily and record, calling PCP for findings outside established parameters Lab Results  Component Value Date   HGBA1C 9.2 (A) 03/18/2023    Patient Self-Care Activities:  Attend all scheduled provider appointments Call pharmacy for medication refills 3-7 days in advance of running out of medications Call provider office for new concerns or questions  Perform all self care activities independently  Perform IADL's (shopping, preparing meals, housekeeping, managing finances) independently Take medications as prescribed   check blood sugar at prescribed times: fasting AM blood sugar check feet daily for cuts, sores or redness enter blood sugar readings and medication or insulin  into daily log take  the blood sugar log to all doctor visits drink 6 to 8 glasses of water each day eat fish at least once per week fill half of plate with vegetables  Plan:  The patient has been provided with contact information for the care management team and has been advised to call with any health related questions or concerns.  Next appointment wit hRN Case manager [place] is on 08/05/23 @ 2pm          VBCI RN Care Plan       Problems:  Chronic Disease Management support and education needs related to 3 falls over 2 months and recent injury to Right ankle due to most recent fall going up staircase @ home.  Goal: Over the next 3 months the Patient will continue to work with Medical illustrator and/or Social Worker to address care management and care coordination needs related to 3 falls over last 2 months and sustaining an injury to right ankle as result of falling while walking up stairs as evidenced by adherence to care management team scheduled appointments      Interventions:   Falls Interventions: Provided written and verbal education re: potential causes of falls and Fall prevention strategies Reviewed medications and discussed potential side effects of medications such as dizziness and frequent urination Advised patient of importance of notifying provider of falls Assessed for signs and symptoms of orthostatic hypotension Assessed for falls since last encounter Assessed patients knowledge of fall risk prevention secondary to previously provided education Provided patient information for fall alert systems Screening for signs and symptoms of depression related to chronic disease state  Assessed social determinant of health barriers  Patient Self-Care Activities:  Attend all scheduled provider appointments Call pharmacy for medication refills 3-7 days in advance of running out of medications Call provider office for new concerns or questions  Perform all self care activities independently   Perform IADL's (shopping, preparing meals, housekeeping, managing finances) independently Take medications as prescribed   Work with the social worker to address care coordination needs and will continue to work with the clinical team to address health care and disease management related needs  Plan:  The patient has been provided with contact information for the care management team and has been advised to call with any health related questions or concerns.              Patient verbalizes understanding of instructions and care plan provided today and agrees to view in MyChart. Active MyChart status and patient understanding of how to access instructions and care plan via MyChart confirmed with patient.     The patient has been provided with contact information for the care management team and has been advised to call with any health related questions or concerns.   Please call the care guide team at [phone] if you need to cancel or reschedule your appointment.   Please call 1-800-273-TALK (toll free, 24 hour hotline) if you are experiencing a Mental Health or Behavioral Health Crisis or need someone to talk to.  Naszir Cott A. Gordy RN, BA, [facility], CRRN [place]  [facility] Population Health RN Care Manager Direct Dial: [phone]  Fax: [phone]

## 2023-07-28 ENCOUNTER — Ambulatory Visit: Payer: 59

## 2023-07-29 ENCOUNTER — Other Ambulatory Visit: Payer: Self-pay

## 2023-07-29 ENCOUNTER — Ambulatory Visit: Admitting: Family

## 2023-07-29 DIAGNOSIS — S99911A Unspecified injury of right ankle, initial encounter: Secondary | ICD-10-CM

## 2023-07-29 NOTE — Progress Notes (Unsigned)
 Office Visit Note   Patient: Felicia [name]           Date of Birth: 1950/08/06           MRN: [id number] Visit Date: 07/29/2023              Requested by: Allwardt, Mardy HERO, PA-C [address] [place],  [place] [address] PCP: Kathrene Mardy HERO, PA-C  No chief complaint on file.     HPI: The patient is a 73 year old woman who presents today for evaluation of right ankle following a fall.  She states she was walking stepped forward with her left foot which slipped and she sustained an inversion injury to her right ankle.  She was able to bear weight initially.  2 days later presented to urgent care for evaluation and radiographs were performed which confirmed a distal fibula fracture.  She has been using a rolling walker and a cam boot minimizing her weightbearing.  She has pain with weightbearing but is unable to use crutches  Assessment & Plan: Visit Diagnoses: No diagnosis found.  Plan: Discussed options offered ORIF.  Patient agreed to proceed.  She will proceed with ORIF of the distal fibula fracture with Dr. Harden on Friday.  She will continue her cam boot boot recommended nonweightbearing  Follow-Up Instructions: No follow-ups on file.   Ortho Exam  Patient is alert, oriented, no adenopathy, well-dressed, normal affect, normal respiratory effort. On examination right ankle there is ecchymosis present no significant edema no erythema foot is plantigrade she does have tenderness to palpation of the distal fibula there is no obvious deformity    Imaging: No results found. No images are attached to the encounter.  Labs: Lab Results  Component Value Date   HGBA1C 10.0 (A) 06/20/2023   HGBA1C 9.2 (A) 03/18/2023   HGBA1C 8.3 (A) 10/23/2022   ESRSEDRATE 24 01/31/2023   ESRSEDRATE 7 11/27/2021   ESRSEDRATE 33 (H) 04/19/2021   CRP <1.0 06/20/2023   CRP <1.0 04/19/2021     Lab Results  Component Value Date   ALBUMIN 4.4 06/20/2023   ALBUMIN 4.4 12/18/2022    ALBUMIN 4.6 06/06/2022    Lab Results  Component Value Date   MG 2.0 05/01/2020   Lab Results  Component Value Date   VD25OH 76.33 06/06/2022   VD25OH 112.91 (HH) 03/26/2022    No results found for: PREALBUMIN    Latest Ref Rng & Units 06/20/2023    2:22 PM 12/18/2022    3:10 PM 06/06/2022    2:08 PM  CBC EXTENDED  WBC 4.0 - 10.5 K/uL 14.9  12.7  13.4   RBC 3.87 - 5.11 Mil/uL 4.63  4.68  4.84   Hemoglobin 12.0 - 15.0 g/dL 85.6  85.2  85.2   HCT 36.0 - 46.0 % 43.4  44.7  45.7   Platelets 150.0 - 400.0 K/uL 257.0  341.0  285.0   NEUT# 1.4 - 7.7 K/uL 3.6  3.4  4.7   Lymph# 0.7 - 4.0 K/uL 10.5  8.6  7.9      There is no height or weight on file to calculate BMI.  Orders:  No orders of the defined types were placed in this encounter.  No orders of the defined types were placed in this encounter.    Procedures: No procedures performed  Clinical Data: No additional findings.  ROS:  All other systems negative, except as noted in the HPI. Review of Systems  Objective: Vital Signs: There were  no vitals taken for this visit.  Specialty Comments:  No specialty comments available.  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   Erosive osteoarthritis of multiple sites 09/26/2021   Frequent headaches 05/31/2021   Bilateral impacted cerumen 02/22/2021   Nasal septal perforation 02/22/2021   Postnasal drip 02/22/2021   Hoarseness 01/25/2021   Hyperglycemia due to type 2 diabetes mellitus (HCC) 01/18/2021   Mixed simple and mucopurulent chronic bronchitis (HCC) 12/27/2020   Centrilobular emphysema (HCC) 07/31/2020   Shortness of breath 07/31/2020   Tongue lesion 04/30/2020   Hypothyroid    Anxiety    Anxiety and depression     Port-A-Cath in place 11/24/2019   Hepatic steatosis 09/28/2019   Malignant neoplasm of upper-outer quadrant of left breast in female, estrogen receptor positive (HCC) 09/02/2019   Trigger index finger of right hand 05/05/2019   Primary osteoarthritis of first carpometacarpal joint of right hand 05/05/2019   Osteoarthritis of finger of right hand 05/05/2019   Endometrial cancer (HCC) 10/23/2018   Cirrhosis of liver without ascites (HCC) 10/23/2018   Diarrhea 10/23/2018   Chest pain 02/15/2018   Coronary artery disease involving native coronary artery of native heart without angina pectoris 11/18/2017   Hyperlipidemia LDL goal <70 11/18/2017   Diabetes mellitus treated with insulin  and oral medication (HCC) 11/18/2017   Dizziness 11/18/2017   Type 2 diabetes mellitus without complication, without long-term current use of insulin  (HCC) 11/18/2017   Past Medical History:  Diagnosis Date   Adenomatous colon polyp 2015   Anemia    Anxiety    Breast cancer (HCC) 09/2019   left breast IMC   CAD (coronary artery disease)    a. 3/2007s/p DES to the LAD ([place] ); b. 01/2017 MV: EF 80%, small, mild apical ant defect w/o ischemia (felt to be breast atten). Low risk.   Depression    DM (diabetes mellitus) (HCC)    Emphysema lung (HCC)    Essential hypertension    GERD (gastroesophageal reflux disease)    Headache    secondary to neck surgery per patient   High triglycerides    HTN (hypertension)    Hyperlipidemia    Hypothyroid    OA (osteoarthritis)    right knee,hands   Overflow incontinence    PONV (postoperative nausea and vomiting)    s/p gallbladder    RLS (restless legs syndrome)    Urinary urgency    Uterine cancer (HCC) dx'd 2014    Family History  Problem Relation Age of Onset   AAA (abdominal aortic aneurysm) Mother    Thyroid  disease Mother    Colon cancer Mother    Diabetes Father    Alzheimer's disease Father    Arthritis Sister    Heart disease Sister     Throat cancer Sister    Kidney failure Sister    Diabetes Sister    Diabetes Brother    Diabetes Brother    Breast cancer Cousin     Past Surgical History:  Procedure Laterality Date   BREAST LUMPECTOMY WITH RADIOACTIVE SEED AND SENTINEL LYMPH NODE BIOPSY Left 10/07/2019   Procedure: LEFT BREAST LUMPECTOMY WITH RADIOACTIVE SEED AND SENTINEL LYMPH NODE BIOPSY;  Surgeon: Ebbie Cough, MD;  Location: [facility];  Service: General;  Laterality: Left;   CHOLECYSTECTOMY     CORONARY STENT PLACEMENT     CORONARY/GRAFT ACUTE MI REVASCULARIZATION     FINGER ARTHRODESIS Right 06/11/2019   Procedure: ARTHRODESIS INDEX FINGER DISTAL PHALANGEAL JOINT;  Surgeon: Murrell Drivers, MD;  Location: [facility];  Service: Orthopedics;  Laterality: Right;   FOOT SURGERY Left    LEFT HEART CATH AND CORONARY ANGIOGRAPHY N/A 02/16/2018   Procedure: LEFT HEART CATH AND CORONARY ANGIOGRAPHY;  Surgeon: Court Dorn PARAS, MD;  Location: [facility];  Service: Cardiovascular;  Laterality: N/A;   LEFT HEART CATH AND CORONARY ANGIOGRAPHY N/A 05/01/2020   Procedure: LEFT HEART CATH AND CORONARY ANGIOGRAPHY;  Surgeon: Court Dorn PARAS, MD;  Location: [facility];  Service: Cardiovascular;  Laterality: N/A;   PORTACATH PLACEMENT Right 11/23/2019   Procedure: INSERTION PORT-A-CATH WITH ULTRASOUND GUIDANCE;  Surgeon: Ebbie Cough, MD;  Location: [facility];  Service: General;  Laterality: Right;   REPLACEMENT TOTAL KNEE Right    SPINE SURGERY     TOTAL ABDOMINAL HYSTERECTOMY     FOR UTERUS CANCER   TOTAL HIP ARTHROPLASTY Left 2015   Social History   Occupational History   Occupation: retired  Tobacco Use   Smoking status: Never   Smokeless tobacco: Never  Vaping Use   Vaping status: Never Used  Substance and Sexual Activity   Alcohol use: Yes    Comment: OCCASIONALLY   Drug use: No   Sexual activity: Not Currently    Birth  control/protection: Surgical    Comment: hyst

## 2023-07-30 ENCOUNTER — Telehealth: Payer: Self-pay | Admitting: Orthopedic Surgery

## 2023-07-30 ENCOUNTER — Other Ambulatory Visit (HOSPITAL_COMMUNITY): Payer: Self-pay

## 2023-07-30 ENCOUNTER — Other Ambulatory Visit: Payer: Self-pay | Admitting: Physician Assistant

## 2023-07-30 ENCOUNTER — Other Ambulatory Visit: Payer: Self-pay

## 2023-07-30 ENCOUNTER — Encounter (HOSPITAL_COMMUNITY): Payer: Self-pay | Admitting: Orthopedic Surgery

## 2023-07-30 MED ORDER — ESCITALOPRAM OXALATE 20 MG PO TABS
20.0000 mg | ORAL_TABLET | Freq: Every day | ORAL | 1 refills | Status: AC
Start: 2023-07-30 — End: ?
  Filled 2023-07-30: qty 90, 90d supply, fill #0
  Filled 2023-10-28: qty 90, 90d supply, fill #1

## 2023-07-30 NOTE — Progress Notes (Signed)
 SDW CALL  Patient was given pre-op instructions over the phone. The opportunity was given for the patient to ask questions. No further questions asked. Patient verbalized understanding of instructions given.   PCP - [name] Cardiologist - Dr. Aleene Nasher/ Dr. Vinie Maxcy  PPM/ICD - denies   Chest x-ray -  EKG - DOS Stress Test - 04/30/20  ECHO - 11/27/20 Cardiac Cath - 05/01/20  Sleep Study - denies   Fasting Blood Sugar - 209-225 Checks Blood Sugar __1___ times a day  Last dose of GLP1 agonist-  last dose of Ozempic  was 7/10 and patient is aware to not take a dose prior to procedure  Jardiance  - last dose was 7/16  Blood Thinner Instructions: n/a Aspirin  Instructions:  last dose was 7/15  ERAS Protcol - clears until 0800 PRE-SURGERY Ensure or G2- n/a  COVID TEST- no   Anesthesia review: yes - CAD, HTN, COPD, DM, A1C - 10.0 on 06/20/23  Patient denies shortness of breath, fever, cough and chest pain over the phone call  Patient states that she was having chest pain on 7/2 and went to [facility] on [address].  She states that she did take a Nitroglycerin  that day and chest pain was not relieved.  She states that she was treated for bronchitis and is having no issues now.  She states that she is using her inhalers are prescribed.     All instructions explained to the patient, with a verbal understanding of the material. Patient agrees to go over the instructions while at home for a better understanding.

## 2023-07-30 NOTE — Telephone Encounter (Signed)
 Patient is scheduled for right open reduction internal fixation fibula fracture at [facility] with Dr. Harden on 08-01-23 (this Friday).  This is patient is asking if she will have a cast on after the surgery, what type of cast, size of cast, and if so for how long?  She is concerned because of her balance (or lack of). She does have a post op appointment scheduled for 08-15-23 but would like to get an idea for planning purposes of the time line for recovery.  [phone]

## 2023-07-31 ENCOUNTER — Other Ambulatory Visit: Payer: Self-pay | Admitting: Pharmacist

## 2023-07-31 ENCOUNTER — Encounter: Payer: Self-pay | Admitting: Family

## 2023-07-31 ENCOUNTER — Encounter: Payer: Self-pay | Admitting: Oncology

## 2023-07-31 NOTE — Progress Notes (Signed)
 Anesthesia Chart Review: SAME DAY WORK-UP  Case: [id number] Date/Time: 08/01/23 0935   Procedure: OPEN REDUCTION INTERNAL FIXATION (ORIF) ANKLE FRACTURE (Right: Ankle) - RIGHT ORIF DISTAL FIBULA FRACTURE   Anesthesia type: Choice   Diagnosis: Closed fracture of distal end of right fibula with routine healing, unspecified fracture morphology, subsequent encounter [S82.831D]   Pre-op diagnosis: distal fibula fracture   [facility] / [facility]   Surgeons: Harden Jerona GAILS, MD       DISCUSSION: Patient is a 73 year old female scheduled for the above procedure. She fell on stairs on 07/18/23 and evaluated at Urgent Care on 07/21/23 due to pain with difficulty with weight bearing. Xray showed acute mildly displaced obliquely oriented fracture of the distal fibular shaft. No ankle dislocation. Referred to Ortho.  History includes never smoker, post-operative N/V, emphysema, HTN, HLD, CAD (s/p March, 2017, Resolute Integrity 2.25 x 18 mm stent - mid LAD), DM2, hypothyroidism, GERD, uterine cancer (TAH/BSO for grade 3 stage I endometrial cancer status post vaginal brachytherapy 10/07/2019), left breast cancer (s/p left breast lumpectomy 10/07/19, s/p chemoradiation; right internal jugular Port-a-cath 11/23/19), anemia, RLS, osteoarthritis (right TKA, left THA).  Last visit with cardiologist Dr. Alveta was on 08/06/22 with more visit with Dr. Mona on 06/06/23. Last cardiac cath on 05/01/20 showed widely patent LAD stent, otherwise no evidence of CAD, EF 55-65%.  Urgent Care visit 07/16/23 for 3 day history of chest pain after she swallowed a piece of toast and it got caught in her throat. Also reported rhinorrhea and mild congestion without sore throat. CXR showed nonspecific central peribronchial thickening, but could be see in viral respiratory illness or bronchitis-bronchiolitis, no consolidation. Note does suggest she was given Nitroglycerin  as a precaution due to her symptoms with known CAD history, but  this did not change any symptoms. Provider felt she had likely viral bronchitis, although with episode with dysphagia with food bolus 3 days prior there was suspicion that aspiration may have contributed to her symptoms and xray findings. She was treated prophylactically with antibiotics and as needed Tylenol  and albuterol . If worsening symptoms then advised to go to the ED. She denied any current respiratory symptoms or chest pain.   A1c 10.0% on 06/20/23.   Last Ozempic  07/24/23. Last Jardiance  07/30/23. Last ASA 07/29/23.    VS:  Wt Readings from Last 3 Encounters:  07/08/23 84.5 kg  07/01/23 83.5 kg  06/25/23 83.5 kg   BP Readings from Last 3 Encounters:  07/08/23 (!) 104/58  06/20/23 128/68  06/06/23 (!) 118/58   Pulse Readings from Last 3 Encounters:  07/08/23 70  06/20/23 67  06/06/23 74     PROVIDERS: Allwardt, Mardy HERO, PA-C is PCP   Alveta Mungo, MD is primary cardiologist Mona Kent, MD is cardiologist ([facility]). Last visit 06/06/23 and may transfer care after Dr. Alveta lucky.   Kassie Acquanetta Bradley, MD is pulmonologist. Last visit 04/22/23 for follow-up emphysema +/- occupational asthma +/- radiation pneumonitis. Symptoms usually worsens seasonally. Well controlled LAMA/LABA.  Six month follow-up planned.  Skeet Cornet, DO is neurologist. Last visit 03/24/23 for chronic cervicogenic HA, chronic neck pain, left ptosis, left TMJ dysfunction. MG panel was negative.   Odean Potts, MD is HEM-ONC Izell Domino, MD is RAD-ONC  [place], DO is GI  Ishmael Slough, MD is rheumatologist   LABS: For day of surgery as indicated. Most recent results in [facility] include: Lab Results  Component Value Date   WBC 14.9 (H) 06/20/2023  HGB 14.3 06/20/2023   HCT 43.4 06/20/2023   PLT 257.0 06/20/2023   GLUCOSE 225 (H) 06/20/2023   ALT 17 06/20/2023   AST 16 06/20/2023   NA 137 06/20/2023   K 4.3 06/20/2023   CL 100 06/20/2023   CREATININE 0.94 06/20/2023   BUN  23 06/20/2023   CO2 30 06/20/2023   TSH 3.19 03/18/2023   HGBA1C 10.0 (A) 06/20/2023   MICROALBUR <0.7 03/18/2023     PFTs 08/10/20: FVC 2.61 (87%) FEV1 2.39 (105%) Ratio 87  TLC 88% DLCO 117% Interpretation: Normal spirometry. Increased DLCO suggestive of asthma No significant bronchodilator response   IMAGES: Xray Right Ankle 07/21/23 ([facility]): There is acute mildly displaced obliquely oriented fracture of the distal fibular shaft. No ankle dislocation.   CXR 07/16/23 ([facility]): Nonspecific central peribronchial thickening, although concerning for viral respiratory illness, or bronchitis-bronchiolitis. No pulmonary consolidation.   US  Thyroid  03/25/23: IMPRESSION: - Small heterogeneous thyroid  gland. Imaging appearance is consistent with longstanding exogenous thyroid  hormone replacement therapy. - No evidence of thyroiditis or thyroid  nodules.  Bone Scan 08/14/22: IMPRESSION: 1. Stable exam.  No signs of osseous metastasis. 2. Chronic arthropathic changes as described above.   EKG:  EKG 07/16/23 ([facility]): Requested tracing. Per Narrative in Care Everywhere:  Sinus rhythm  Left axis deviation  Possible LVH   Cornell product   No previous ECGs available  Confirmed by Joannie Fitch  [id number]  on 07-19-2023 2 33 00 PM   EKG 08/06/22: Sinus bradycardia at 58 bpm Left axis deviation Moderate voltage criteria for LVH, may be normal variant ( R in aVL , Cornell product ) When compared with ECG of 27-Jun-2021 19:55, No significant change was found Confirmed by Alveta Mungo ([id number]) on 08/06/2022 11:46:51 AM   CV: Echo 11/27/20: IMPRESSIONS   1. Global longitudinal strain is -23.3%. Left ventricular ejection  fraction, by estimation, is 60 to 65%. The left ventricle has normal  function. The left ventricle has no regional wall motion abnormalities.  Left ventricular diastolic parameters were  normal.   2. Right ventricular systolic function is normal. The right  ventricular  size is normal.   3. Trivial mitral valve regurgitation.   4. The aortic valve is tricuspid. Aortic valve regurgitation is trivial.  Aortic valve sclerosis is present, with no evidence of aortic valve  stenosis.   5. Aortic dilatation noted. There is mild dilatation of the ascending  aorta, measuring 38 mm.   6. The inferior vena cava is normal in size with greater than 50%  respiratory variability, suggesting right atrial pressure of 3 mmHg.    Cardiac cath 05/01/20: Previously placed Mid LAD stent (unknown type) is widely patent. The left ventricular systolic function is normal. LV end diastolic pressure is normal. The left ventricular ejection fraction is 55-65% by visual estimate. IMPRESSION: Ms. Tadros has a widely patent mid LAD stent and otherwise no evidence of CAD.  Her anatomy is essentially unchanged from her prior cath performed by myself 2/20.  I believe her chest pain is noncardiac after Myoview  false positive.    Nuclear stress test 04/30/20: IMPRESSION: 1. Subtle area of reversibility in the anteroapical wall concerning for ischemia. 2. Normal left ventricular wall motion. 3. Left ventricular ejection fraction 65% 4. Non invasive risk stratification: Intermediate    Past Medical History:  Diagnosis Date   Adenomatous colon polyp 2015   Anemia    Anxiety    Breast cancer (HCC) 09/2019   left breast [facility]  CAD (coronary artery disease)    a. 3/2007s/p DES to the LAD ([place] ); b. 01/2017 MV: EF 80%, small, mild apical ant defect w/o ischemia (felt to be breast atten). Low risk.   Depression    DM (diabetes mellitus) (HCC)    Emphysema lung (HCC)    Essential hypertension    GERD (gastroesophageal reflux disease)    Headache    secondary to neck surgery per patient   High triglycerides    HTN (hypertension)    Hyperlipidemia    Hypothyroid    OA (osteoarthritis)    right knee,hands   Overflow incontinence    Pneumonia 2003   PONV  (postoperative nausea and vomiting)    s/p gallbladder    RLS (restless legs syndrome)    Urinary urgency    Uterine cancer (HCC) dx'd 2014    Past Surgical History:  Procedure Laterality Date   BREAST LUMPECTOMY WITH RADIOACTIVE SEED AND SENTINEL LYMPH NODE BIOPSY Left 10/07/2019   Procedure: LEFT BREAST LUMPECTOMY WITH RADIOACTIVE SEED AND SENTINEL LYMPH NODE BIOPSY;  Surgeon: Ebbie Cough, MD;  Location: [facility];  Service: General;  Laterality: Left;   CHOLECYSTECTOMY     COLONOSCOPY     CORONARY STENT PLACEMENT     CORONARY/GRAFT ACUTE MI REVASCULARIZATION     FINGER ARTHRODESIS Right 06/11/2019   Procedure: ARTHRODESIS INDEX FINGER DISTAL PHALANGEAL JOINT;  Surgeon: Murrell Drivers, MD;  Location: [facility];  Service: Orthopedics;  Laterality: Right;   LEFT HEART CATH AND CORONARY ANGIOGRAPHY N/A 02/16/2018   Procedure: LEFT HEART CATH AND CORONARY ANGIOGRAPHY;  Surgeon: Court Dorn PARAS, MD;  [facility];  Service: Cardiovascular;  Laterality: N/A;   LEFT HEART CATH AND CORONARY ANGIOGRAPHY N/A 05/01/2020   Procedure: LEFT HEART CATH AND CORONARY ANGIOGRAPHY;  Surgeon: Court Dorn PARAS, MD;  [facility];  Service: Cardiovascular;  Laterality: N/A;   PORTACATH PLACEMENT Right 11/23/2019   Procedure: INSERTION PORT-A-CATH WITH ULTRASOUND GUIDANCE;  Surgeon: Ebbie Cough, MD;  Location: [facility];  Service: General;  Laterality: Right;   POSTERIOR FUSION CERVICAL SPINE     C3-C7   REPLACEMENT TOTAL KNEE Right    SPINE SURGERY     TOTAL ABDOMINAL HYSTERECTOMY     FOR UTERUS CANCER   TOTAL HIP ARTHROPLASTY Left 2015    MEDICATIONS: No current facility-administered medications for this encounter.    ACCU-CHEK GUIDE test strip   Accu-Chek Softclix Lancets lancets   acetaminophen  (TYLENOL ) 500 MG tablet   albuterol  (VENTOLIN  HFA) 108 (90 Base) MCG/ACT inhaler   anastrozole  (ARIMIDEX ) 1  MG tablet   Ascorbic Acid (VITAMIN C PO)   aspirin  EC (ASPIRIN  81) 81 MG tablet   azelastine  (ASTELIN ) 0.1 % nasal spray   bisoprolol  (ZEBETA ) 5 MG tablet   blood glucose meter kit and supplies   buPROPion  (WELLBUTRIN  XL) 150 MG 24 hr tablet   celecoxib  (CELEBREX ) 200 MG capsule   CINNAMON PO   clotrimazole -betamethasone  (LOTRISONE) cream   diclofenac  sodium (VOLTAREN ) 1 % GEL   empagliflozin  (JARDIANCE ) 25 MG TABS tablet   escitalopram  (LEXAPRO ) 20 MG tablet   fenofibrate  (TRICOR ) 145 MG tablet   Ferrous Sulfate (IRON PO)   gabapentin  (NEURONTIN ) 600 MG tablet   glipiZIDE  (GLUCOTROL  XL) 10 MG 24 hr tablet   glucose blood test strip   HYDROcodone -acetaminophen  (NORCO/VICODIN) 5-325 MG tablet   Incontinence Supply Disposable (DEPEND ADJUSTABLE UNDERWEAR) MISC   insulin  glargine (LANTUS  SOLOSTAR) 100  UNIT/ML Solostar Pen   Insulin  Pen Needle (INSUPEN PEN NEEDLES) 32G X 4 MM MISC   ketoconazole  (NIZORAL ) 2 % cream   Lancets MISC   levothyroxine  (SYNTHROID ) 125 MCG tablet   loratadine  (ALLERGY RELIEF) 10 MG tablet   magnesium gluconate (MAGONATE) 500 (27 Mg) MG TABS tablet   Multiple Vitamins-Calcium  (ONE-A-DAY WOMENS PO)   mupirocin  cream (BACTROBAN ) 2 %   nitroGLYCERIN  (NITROSTAT ) 0.4 MG SL tablet   omeprazole  (PRILOSEC) 40 MG capsule   polyethylene glycol powder (MIRALAX ) 17 GM/SCOOP powder   Potassium 99 MG TABS   rOPINIRole  (REQUIP ) 1 MG tablet   rosuvastatin  (CRESTOR ) 40 MG tablet   Semaglutide ,0.25 or 0.5MG /DOS, (OZEMPIC , 0.25 OR 0.5 MG/DOSE,) 2 MG/3ML SOPN   Tiotropium Bromide-Olodaterol (STIOLTO RESPIMAT ) 2.5-2.5 MCG/ACT AERS   tiZANidine  (ZANAFLEX ) 2 MG tablet   traMADol  (ULTRAM ) 50 MG tablet   triamcinolone  (KENALOG ) 0.1 % paste   TURMERIC PO   Wheat Dextrin (BENEFIBER) POWD     Isaiah Ruder, PA-C Surgical Short Stay/Anesthesiology [facility] Phone [phone] [facility] Phone [phone] 07/31/2023 10:42 AM

## 2023-07-31 NOTE — Progress Notes (Signed)
 Called and informed patient to arrive at 0720 for the 0950 surgical start time.

## 2023-07-31 NOTE — Progress Notes (Signed)
 07/03/2023 Name: Felicia [name] MRN: [id number] DOB: 10-05-1950  Chief Complaint  Patient presents with   Diabetes   Medication Management    Felicia [name] is a 73 y.o. year old female who presented for a telephone visit.   They were referred to the pharmacist by their PCP for assistance in managing diabetes, complex medication management, and frequent falls.   Subjective:  Care Team: Primary Care Provider: Allwardt, Mardy HERO, PA-C ; Next Scheduled Visit: 09/04/2023 Endocrinologist Dr Mercie; Next Scheduled Visit: 10/14/2023 - initial visit Neurologist: Skeet; Next Scheduled Visit: 09/29/2023  Medication Access/Adherence  Current Pharmacy:  [name] - [facility] [address] [place] [address] Phone: [phone] Fax: [phone]  [name] [address] [place] [address] Phone: [phone] Fax: [phone]   Patient reports affordability concerns with their medications: No  Patient reports access/transportation concerns to their pharmacy: No  Patient reports adherence concerns with their medications:  No      Diabetes:  Current medications:  Jardiance  25mg  daily, glipizide  10mg  - take 2 tablets every morning, Lantus  50 units once a day.  Started Ozempic  0.25mg  weekly about 2 weeks ago but is currently holding prior to surgery tomorrow. Will restart Ozempic  after surgery.  Medications tried in the past: Trulicity  - stopped because she had trouble getting it; Mounjaro  - chart states she stopped due to diarrhea and night sweats but patient did not remember taking Mounjaro ; metformin  - stopped because suspected was worsening diarrhea (fecal incontinence)   Current glucose readings: 190's over the last week but have been improved around 120's until she had a fall.  Using Accu-Chek meter; testing 1 to 2 times daily She tried Continuous Glucose Monitor in past but didn't like it - sensor did not stay on  and too many alerts.   Patient reports hypoglycemic s/sx including dizziness, shakiness, sweating. Occurs about 2 times per week - states that she feels hypoglycemia symptoms when blood glucose is in the 90's. She will usually drink juice when she feels these symptoms and blood glucose low.  Patient denies hyperglycemic symptoms including no polyuria, polydipsia, polyphagia, nocturia, neuropathy, blurred vision.  Receiving Meals on Wheels at home for next 6 months. She generally likes what they send. She doesn't like collards but will eats a little. She will sometimes add veggies like Brussels sprouts.  Snacks  Current medication access support: none  Hypertension:  Current medications: bisoprolol  5mg  once at bedtime  Patient denies hypotensive s/sx including no dizziness, lightheadedness.  Patient denies hypertensive symptoms including no headache, chest pain, shortness of breath  Hyperlipidemia:  Last LDL was 199 She restarted rosuvastatin  10mg  daily 07/23/2023 after our visit in June, send request to restart statin to PCP.  Patient report she is tolerating rosuvastatin  without any side effects.   Frequent Falls - Patient fell on 07/18/2023 going up steps at her daughters home. She denies dizziness. Just felt that her leg were weak. She fractured ankle and will have surgery tomorrow 08/01/2023.  Current medications associated with increase in falls - bisoprolol , escitalopram , gabapentin  (taking for headaches), glipizide , hydrocodone  (takes only as needed - a few times per month) , insulin  (hypoglycemia), tizanidine , tramadol    Objective:  BP Readings from Last 3 Encounters:  07/08/23 (!) 104/58  06/20/23 128/68  06/06/23 (!) 118/58     Lab Results  Component Value Date   HGBA1C 10.0 (A) 06/20/2023    Lab Results  Component Value Date   CREATININE 0.94  06/20/2023   BUN 23 06/20/2023   NA 137 06/20/2023   K 4.3 06/20/2023   CL 100 06/20/2023   CO2 30 06/20/2023    Lab  Results  Component Value Date   CHOL 281 (H) 11/14/2022   HDL 46 11/14/2022   LDLCALC 161 (H) 11/14/2022   TRIG 389 (H) 11/14/2022   CHOLHDL 6.1 (H) 11/14/2022    Medications Reviewed Today   Medications were not reviewed in this encounter       Assessment/Plan:   Diabetes: Currently uncontrolled - but improving with Lantus  titration - Reviewed goal A1c, goal fasting, and goal 2 hour post prandial glucose - Recommend to continue Jardiance , glipizide  and Lantus  - Restart Ozempic  0.25mg  weekly after surgery x 2 doses, then increase to 0.5mg  weekly - Recommend to check glucose 1 to 2 times a day a varying times of day.   Hypertension:Currently controlled - Reviewed long term cardiovascular and renal outcomes of uncontrolled blood pressure - Recommended to check home blood pressure and heart rate when she feels lightheaded / dizzy - Recommend to continue bisoprolol  (could consider ACEI or ARB in the future)    Hyperlipidemia  -continue rosuvastatin  20mg  daily - due to recheck lipids in August 2025.   Medication Management: - Regarding frequent falls - should minimize medications that can increase fall risk as much as possible. Patient is using pain medications and muscles relaxer sparingly. Continue physical therapy.   Follow Up Plan: 4 weeks  Madelin Ray, PharmD Clinical Pharmacist [facility]  Population Health [phone]

## 2023-07-31 NOTE — Anesthesia Preprocedure Evaluation (Addendum)
 Anesthesia Evaluation  Patient identified by MRN, date of birth, ID band Patient awake    Reviewed: Allergy & Precautions, NPO status , Patient's Chart, lab work & pertinent test results, reviewed documented beta blocker date and time   History of Anesthesia Complications (+) PONV and history of anesthetic complications  Airway Mallampati: III  TM Distance: >3 FB Neck ROM: Full    Dental  (+) Dental Advisory Given, Chipped,    Pulmonary COPD,  COPD inhaler   Pulmonary exam normal breath sounds clear to auscultation       Cardiovascular hypertension, Pt. on home beta blockers and Pt. on medications + CAD and + Cardiac Stents  Normal cardiovascular exam Rhythm:Regular Rate:Normal     Neuro/Psych  Headaches PSYCHIATRIC DISORDERS Anxiety Depression       GI/Hepatic Neg liver ROS,GERD  ,,  Endo/Other  diabetes, Type 2, Oral Hypoglycemic Agents, Insulin  DependentHypothyroidism    Renal/GU negative Renal ROS  negative genitourinary   Musculoskeletal  (+) Arthritis ,    Abdominal   Peds  Hematology negative hematology ROS (+)   Anesthesia Other Findings   Reproductive/Obstetrics                              Anesthesia Physical Anesthesia Plan  ASA: 3  Anesthesia Plan: General and Regional   Post-op Pain Management: Regional block* and Tylenol  PO (pre-op)*   Induction: Intravenous  PONV Risk Score and Plan: 4 or greater and Ondansetron , Dexamethasone , Treatment may vary due to age or medical condition and Midazolam   Airway Management Planned: LMA  Additional Equipment:   Intra-op Plan:   Post-operative Plan: Extubation in OR  Informed Consent: I have reviewed the patients History and Physical, chart, labs and discussed the procedure including the risks, benefits and alternatives for the proposed anesthesia with the patient or authorized representative who has indicated his/her  understanding and acceptance.     Dental advisory given  Plan Discussed with: CRNA  Anesthesia Plan Comments: (PAT note written 07/31/2023 by Allison Zelenak, PA-C.  )         Anesthesia Quick Evaluation

## 2023-07-31 NOTE — H&P (Signed)
 Felicia [name] is an 73 y.o. female.   Chief Complaint: right ankle distal fibular fracture Weber B HPI: 73 y/o female here for f/u after being seen in an urgent care for acute right ankle pain.  She states she was walking up stairs assisted by daughter, L knee gave out causing Pt to invert her R ankle as she fell to the stairs.  She was diagnosed with a weber type B distal fibular fracture.    Past Medical History:  Diagnosis Date   Adenomatous colon polyp 2015   Anemia    Anxiety    Breast cancer (HCC) 09/2019   left breast IMC   CAD (coronary artery disease)    a. 3/2007s/p DES to the LAD ([place] ); b. 01/2017 MV: EF 80%, small, mild apical ant defect w/o ischemia (felt to be breast atten). Low risk.   Depression    DM (diabetes mellitus) (HCC)    Emphysema lung (HCC)    Essential hypertension    GERD (gastroesophageal reflux disease)    Headache    secondary to neck surgery per patient   High triglycerides    HTN (hypertension)    Hyperlipidemia    Hypothyroid    OA (osteoarthritis)    right knee,hands   Overflow incontinence    Pneumonia 2003   PONV (postoperative nausea and vomiting)    s/p gallbladder    RLS (restless legs syndrome)    Urinary urgency    Uterine cancer (HCC) dx'd 2014    Past Surgical History:  Procedure Laterality Date   BREAST LUMPECTOMY WITH RADIOACTIVE SEED AND SENTINEL LYMPH NODE BIOPSY Left 10/07/2019   Procedure: LEFT BREAST LUMPECTOMY WITH RADIOACTIVE SEED AND SENTINEL LYMPH NODE BIOPSY;  Surgeon: Ebbie Cough, MD;  Location: [facility];  Service: General;  Laterality: Left;   CHOLECYSTECTOMY     COLONOSCOPY     CORONARY STENT PLACEMENT     CORONARY/GRAFT ACUTE MI REVASCULARIZATION     FINGER ARTHRODESIS Right 06/11/2019   Procedure: ARTHRODESIS INDEX FINGER DISTAL PHALANGEAL JOINT;  Surgeon: Murrell Drivers, MD;  Location: [facility];  Service: Orthopedics;  Laterality: Right;   LEFT HEART CATH AND  CORONARY ANGIOGRAPHY N/A 02/16/2018   Procedure: LEFT HEART CATH AND CORONARY ANGIOGRAPHY;  Surgeon: Court Dorn PARAS, MD;  Location: [facility];  Service: Cardiovascular;  Laterality: N/A;   LEFT HEART CATH AND CORONARY ANGIOGRAPHY N/A 05/01/2020   Procedure: LEFT HEART CATH AND CORONARY ANGIOGRAPHY;  Surgeon: Court Dorn PARAS, MD;  Location: [facility];  Service: Cardiovascular;  Laterality: N/A;   PORTACATH PLACEMENT Right 11/23/2019   Procedure: INSERTION PORT-A-CATH WITH ULTRASOUND GUIDANCE;  Surgeon: Ebbie Cough, MD;  Location: [facility];  Service: General;  Laterality: Right;   POSTERIOR FUSION CERVICAL SPINE     C3-C7   REPLACEMENT TOTAL KNEE Right    SPINE SURGERY     TOTAL ABDOMINAL HYSTERECTOMY     FOR UTERUS CANCER   TOTAL HIP ARTHROPLASTY Left 2015    Family History  Problem Relation Age of Onset   AAA (abdominal aortic aneurysm) Mother    Thyroid  disease Mother    Colon cancer Mother    Diabetes Father    Alzheimer's disease Father    Arthritis Sister    Heart disease Sister    Throat cancer Sister    Kidney failure Sister    Diabetes Sister    Diabetes Brother    Diabetes Brother    Breast  cancer Cousin    Social History:  reports that she has never smoked. She has never used smokeless tobacco. She reports that she does not currently use alcohol. She reports that she does not use drugs.  Allergies:  Allergies  Allergen Reactions   Chloraprep One Step [Chlorhexidine  Gluconate] Rash   Estrogens Other (See Comments)    PATIENT HAS A HISTORY OF CANCER AND HAS BEEN TOLD TO NEVER TAKE ANYTHING CONTAINING ESTROGEN, AS IT MIGHT CAUSE A RECURRENCE   Shellfish-Derived Products Nausea And Vomiting   Shrimp [Shellfish Allergy] Nausea And Vomiting    No medications prior to admission.    No results found. However, due to the size of the patient record, not all encounters were searched. Please check Results Review for a complete  set of results. No results found.  Review of Systems  All other systems reviewed and are negative.   There were no vitals taken for this visit. Physical Exam  General: alert, well appearing, cooperative, in no distress Resp: normal effort Heart: regular rate Skin: otherwise noted to be warm, dry, appropriate color, texture, and turgor, without rashes or lesions,   MSK: Location: right ankle,  Visualization: there is mild lateral swelling, no gross deformity, no erythema, no gross ecchymosis, no lesions/wounds or rashes, Pt using cane Palpation: mild to minimal ttp over lateral ankle ligaments, mild to moderate ttp at and from lateral bony malleolus superiorly into distal fib, no extra warmth noted, ROM: mild limits of dorsiflexion / plantarflexion 2/2 pain Special Tests: lat/med ligamentous laxity negative, anterior drawer negative, Thompson test (achilles rupture) negative, Squeeze test: negative Gait: ambulates > 4 steps in clinic assisted with cane, gait antalgic, favoring affected side,  Neuro: motor, strength, and sensory grossly intact, DP 2+, cap refill <2s, distal NVI,   Assessment/Plan  Right LE traumatic ankle inversion  Right ankle Weber B fracture of the distal Fibula Plan for ORIF of right ankle by Dr. Harden  08/01/23.  Cam boot, NWB post op until f/u 2-3 weeks.  Will need a walker.   Maurilio Deland Collet, PA-C 07/31/2023, 9:29 AM

## 2023-07-31 NOTE — Telephone Encounter (Signed)
 SW pt, all questions answered. Let her know that she will not be put in a cast, they will put her in a fracture boot and she will be non weightbearing on that side.

## 2023-08-01 ENCOUNTER — Other Ambulatory Visit (HOSPITAL_COMMUNITY): Payer: Self-pay

## 2023-08-01 ENCOUNTER — Ambulatory Visit (HOSPITAL_COMMUNITY): Admitting: Vascular Surgery

## 2023-08-01 ENCOUNTER — Encounter (HOSPITAL_COMMUNITY): Payer: Self-pay | Admitting: Orthopedic Surgery

## 2023-08-01 ENCOUNTER — Ambulatory Visit (HOSPITAL_COMMUNITY)

## 2023-08-01 ENCOUNTER — Other Ambulatory Visit: Payer: Self-pay

## 2023-08-01 ENCOUNTER — Encounter (HOSPITAL_COMMUNITY)
Admission: RE | Disposition: A | Discharge status: Home / Self Care | Payer: Self-pay | Source: Home / Self Care | Attending: Orthopedic Surgery

## 2023-08-01 ENCOUNTER — Encounter (HOSPITAL_COMMUNITY): Admitting: Vascular Surgery

## 2023-08-01 ENCOUNTER — Observation Stay (HOSPITAL_COMMUNITY)
Admission: RE | Admit: 2023-08-01 | Discharge: 2023-08-02 | Disposition: A | Discharge status: Home / Self Care | Attending: Orthopedic Surgery | Admitting: Orthopedic Surgery

## 2023-08-01 DIAGNOSIS — I2510 Atherosclerotic heart disease of native coronary artery without angina pectoris: Secondary | ICD-10-CM | POA: Diagnosis not present

## 2023-08-01 DIAGNOSIS — S82891A Other fracture of right lower leg, initial encounter for closed fracture: Principal | ICD-10-CM

## 2023-08-01 DIAGNOSIS — W108XXA Fall (on) (from) other stairs and steps, initial encounter: Secondary | ICD-10-CM | POA: Diagnosis not present

## 2023-08-01 DIAGNOSIS — Z96642 Presence of left artificial hip joint: Secondary | ICD-10-CM | POA: Insufficient documentation

## 2023-08-01 DIAGNOSIS — Z853 Personal history of malignant neoplasm of breast: Secondary | ICD-10-CM | POA: Diagnosis not present

## 2023-08-01 DIAGNOSIS — Y9301 Activity, walking, marching and hiking: Secondary | ICD-10-CM | POA: Diagnosis not present

## 2023-08-01 DIAGNOSIS — I10 Essential (primary) hypertension: Secondary | ICD-10-CM | POA: Diagnosis not present

## 2023-08-01 DIAGNOSIS — S82831A Other fracture of upper and lower end of right fibula, initial encounter for closed fracture: Secondary | ICD-10-CM | POA: Diagnosis not present

## 2023-08-01 DIAGNOSIS — E039 Hypothyroidism, unspecified: Secondary | ICD-10-CM | POA: Diagnosis not present

## 2023-08-01 DIAGNOSIS — J449 Chronic obstructive pulmonary disease, unspecified: Secondary | ICD-10-CM | POA: Insufficient documentation

## 2023-08-01 DIAGNOSIS — Z8542 Personal history of malignant neoplasm of other parts of uterus: Secondary | ICD-10-CM | POA: Insufficient documentation

## 2023-08-01 DIAGNOSIS — E119 Type 2 diabetes mellitus without complications: Secondary | ICD-10-CM | POA: Insufficient documentation

## 2023-08-01 DIAGNOSIS — G8918 Other acute postprocedural pain: Secondary | ICD-10-CM | POA: Diagnosis not present

## 2023-08-01 DIAGNOSIS — S82899A Other fracture of unspecified lower leg, initial encounter for closed fracture: Secondary | ICD-10-CM | POA: Diagnosis present

## 2023-08-01 DIAGNOSIS — Z96651 Presence of right artificial knee joint: Secondary | ICD-10-CM | POA: Diagnosis not present

## 2023-08-01 DIAGNOSIS — Z955 Presence of coronary angioplasty implant and graft: Secondary | ICD-10-CM | POA: Insufficient documentation

## 2023-08-01 HISTORY — PX: ORIF ANKLE FRACTURE: SHX5408

## 2023-08-01 LAB — CBC WITH DIFFERENTIAL/PLATELET
Abs Immature Granulocytes: 0 K/uL (ref 0.00–0.07)
Basophils Absolute: 0 K/uL (ref 0.0–0.1)
Basophils Relative: 0 %
Eosinophils Absolute: 0.8 K/uL — ABNORMAL HIGH (ref 0.0–0.5)
Eosinophils Relative: 4 %
HCT: 42.8 % (ref 36.0–46.0)
Hemoglobin: 13.9 g/dL (ref 12.0–15.0)
Lymphocytes Relative: 50 %
Lymphs Abs: 9.7 K/uL — ABNORMAL HIGH (ref 0.7–4.0)
MCH: 31.2 pg (ref 26.0–34.0)
MCHC: 32.5 g/dL (ref 30.0–36.0)
MCV: 96.2 fL (ref 80.0–100.0)
Monocytes Absolute: 0.2 K/uL (ref 0.1–1.0)
Monocytes Relative: 1 %
Neutro Abs: 8.7 K/uL — ABNORMAL HIGH (ref 1.7–7.7)
Neutrophils Relative %: 45 %
Platelets: 283 K/uL (ref 150–400)
RBC: 4.45 MIL/uL (ref 3.87–5.11)
RDW: 14.8 % (ref 11.5–15.5)
WBC: 19.4 K/uL — ABNORMAL HIGH (ref 4.0–10.5)
nRBC: 0 % (ref 0.0–0.2)
nRBC: 0 /100{WBCs}

## 2023-08-01 LAB — COMPREHENSIVE METABOLIC PANEL WITH GFR
ALT: 19 U/L (ref 0–44)
AST: 25 U/L (ref 15–41)
Albumin: 3.9 g/dL (ref 3.5–5.0)
Alkaline Phosphatase: 50 U/L (ref 38–126)
Anion gap: 13 (ref 5–15)
BUN: 21 mg/dL (ref 8–23)
CO2: 21 mmol/L — ABNORMAL LOW (ref 22–32)
Calcium: 10.1 mg/dL (ref 8.9–10.3)
Chloride: 101 mmol/L (ref 98–111)
Creatinine, Ser: 0.81 mg/dL (ref 0.44–1.00)
GFR, Estimated: >60 mL/min (ref >60)
Glucose, Bld: 221 mg/dL — ABNORMAL HIGH (ref 70–99)
Potassium: 4.3 mmol/L (ref 3.5–5.1)
Sodium: 135 mmol/L (ref 135–145)
Total Bilirubin: 0.7 mg/dL (ref 0.0–1.2)
Total Protein: 6.6 g/dL (ref 6.5–8.1)

## 2023-08-01 LAB — GLUCOSE, CAPILLARY
Glucose-Capillary: 231 mg/dL — ABNORMAL HIGH (ref 70–99)
Glucose-Capillary: 209 mg/dL — ABNORMAL HIGH (ref 70–99)
Glucose-Capillary: 193 mg/dL — ABNORMAL HIGH (ref 70–99)
Glucose-Capillary: 294 mg/dL — ABNORMAL HIGH (ref 70–99)
Glucose-Capillary: 320 mg/dL — ABNORMAL HIGH (ref 70–99)

## 2023-08-01 LAB — CBC
HCT: 45 % (ref 36.0–46.0)
Hemoglobin: 14.5 g/dL (ref 12.0–15.0)
MCH: 30.8 pg (ref 26.0–34.0)
MCHC: 32.2 g/dL (ref 30.0–36.0)
MCV: 95.5 fL (ref 80.0–100.0)
Platelets: 302 K/uL (ref 150–400)
RBC: 4.71 MIL/uL (ref 3.87–5.11)
RDW: 14.8 % (ref 11.5–15.5)
WBC: 20.9 K/uL — ABNORMAL HIGH (ref 4.0–10.5)
nRBC: 0 % (ref 0.0–0.2)

## 2023-08-01 LAB — CREATININE, SERUM
Creatinine, Ser: 1.02 mg/dL — ABNORMAL HIGH (ref 0.44–1.00)
GFR, Estimated: 58 mL/min — ABNORMAL LOW (ref >60)

## 2023-08-01 LAB — SURGICAL PCR SCREEN
MRSA, PCR: NEGATIVE
Staphylococcus aureus: NEGATIVE

## 2023-08-01 SURGERY — OPEN REDUCTION INTERNAL FIXATION (ORIF) ANKLE FRACTURE
Anesthesia: Regional | Site: Ankle | Laterality: Right

## 2023-08-01 MED ORDER — PHENYLEPHRINE 80 MCG/ML (10ML) SYRINGE FOR IV PUSH (FOR BLOOD PRESSURE SUPPORT)
PREFILLED_SYRINGE | INTRAVENOUS | Status: DC | PRN
  Administered 2023-08-01 (×2): 80 ug via INTRAVENOUS

## 2023-08-01 MED ORDER — PROPOFOL 10 MG/ML IV BOLUS
INTRAVENOUS | Status: DC | PRN
  Administered 2023-08-01: 200 mg via INTRAVENOUS

## 2023-08-01 MED ORDER — LEVOTHYROXINE SODIUM 100 MCG PO TABS
125.0000 ug | ORAL_TABLET | ORAL | Status: DC
  Administered 2023-08-02: 125 ug via ORAL
  Filled 2023-08-01: qty 1

## 2023-08-01 MED ORDER — PANTOPRAZOLE SODIUM 40 MG PO TBEC
40.0000 mg | DELAYED_RELEASE_TABLET | Freq: Every day | ORAL | Status: DC
  Administered 2023-08-01 – 2023-08-02 (×2): 40 mg via ORAL
  Filled 2023-08-01 (×2): qty 1

## 2023-08-01 MED ORDER — OXYCODONE HCL 5 MG/5ML PO SOLN: 5.0000 mg | Freq: Once | ORAL | Status: AC | PRN

## 2023-08-01 MED ORDER — ONDANSETRON HCL 4 MG PO TABS: 4.0000 mg | ORAL_TABLET | Freq: Four times a day (QID) | ORAL | Status: DC | PRN

## 2023-08-01 MED ORDER — BISOPROLOL FUMARATE 5 MG PO TABS
5.0000 mg | ORAL_TABLET | Freq: Every day | ORAL | Status: DC
  Administered 2023-08-01: 5 mg via ORAL
  Filled 2023-08-01 (×3): qty 1

## 2023-08-01 MED ORDER — ENOXAPARIN SODIUM 30 MG/0.3ML IJ SOSY: 30.0000 mg | PREFILLED_SYRINGE | INTRAMUSCULAR | Status: DC

## 2023-08-01 MED ORDER — PROPOFOL 10 MG/ML IV BOLUS
INTRAVENOUS | Status: AC
  Filled 2023-08-01: qty 20

## 2023-08-01 MED ORDER — ONDANSETRON HCL 4 MG/2ML IJ SOLN: 4.0000 mg | Freq: Four times a day (QID) | INTRAMUSCULAR | Status: DC | PRN

## 2023-08-01 MED ORDER — OXYCODONE HCL 5 MG PO TABS
5.0000 mg | ORAL_TABLET | Freq: Once | ORAL | Status: AC | PRN
  Administered 2023-08-01: 5 mg via ORAL

## 2023-08-01 MED ORDER — DEXAMETHASONE SODIUM PHOSPHATE 10 MG/ML IJ SOLN
INTRAMUSCULAR | Status: DC | PRN
  Administered 2023-08-01: 5 mg via INTRAVENOUS

## 2023-08-01 MED ORDER — OXYCODONE HCL 5 MG PO TABS
ORAL_TABLET | ORAL | Status: AC
  Filled 2023-08-01: qty 1

## 2023-08-01 MED ORDER — TIZANIDINE HCL 4 MG PO TABS
2.0000 mg | ORAL_TABLET | Freq: Three times a day (TID) | ORAL | Status: DC
  Administered 2023-08-01 – 2023-08-02 (×2): 2 mg via ORAL
  Filled 2023-08-01 (×2): qty 1

## 2023-08-01 MED ORDER — METOCLOPRAMIDE HCL 5 MG PO TABS: 5.0000 mg | ORAL_TABLET | Freq: Three times a day (TID) | ORAL | Status: DC | PRN

## 2023-08-01 MED ORDER — ALBUTEROL SULFATE (2.5 MG/3ML) 0.083% IN NEBU: 2.5000 mg | INHALATION_SOLUTION | Freq: Four times a day (QID) | RESPIRATORY_TRACT | Status: DC | PRN

## 2023-08-01 MED ORDER — BUPROPION HCL ER (XL) 150 MG PO TB24
150.0000 mg | ORAL_TABLET | Freq: Every morning | ORAL | Status: DC
  Administered 2023-08-02: 150 mg via ORAL
  Filled 2023-08-01: qty 1

## 2023-08-01 MED ORDER — CELECOXIB 200 MG PO CAPS
200.0000 mg | ORAL_CAPSULE | Freq: Two times a day (BID) | ORAL | Status: DC
Start: 2023-08-01 — End: 2023-08-02
  Administered 2023-08-01 – 2023-08-02 (×2): 200 mg via ORAL
  Filled 2023-08-01 (×2): qty 1

## 2023-08-01 MED ORDER — INSULIN ASPART 100 UNIT/ML IJ SOLN
0.0000 [IU] | INTRAMUSCULAR | Status: AC | PRN
  Administered 2023-08-01: 3 [IU] via SUBCUTANEOUS
  Administered 2023-08-01: 2 [IU] via SUBCUTANEOUS
  Filled 2023-08-01 (×2): qty 1

## 2023-08-01 MED ORDER — ROSUVASTATIN CALCIUM 20 MG PO TABS
40.0000 mg | ORAL_TABLET | Freq: Every day | ORAL | Status: DC
  Administered 2023-08-01 – 2023-08-02 (×2): 40 mg via ORAL
  Filled 2023-08-01 (×2): qty 2

## 2023-08-01 MED ORDER — FENTANYL CITRATE (PF) 100 MCG/2ML IJ SOLN
INTRAMUSCULAR | Status: AC
  Administered 2023-08-01: 100 ug
  Filled 2023-08-01: qty 2

## 2023-08-01 MED ORDER — ACETAMINOPHEN 500 MG PO TABS: 500.0000 mg | ORAL_TABLET | Freq: Four times a day (QID) | ORAL | Status: DC | PRN

## 2023-08-01 MED ORDER — ONDANSETRON HCL 4 MG/2ML IJ SOLN
INTRAMUSCULAR | Status: AC
  Filled 2023-08-01: qty 2

## 2023-08-01 MED ORDER — ACETAMINOPHEN 325 MG PO TABS
ORAL_TABLET | ORAL | Status: AC
  Filled 2023-08-01: qty 2

## 2023-08-01 MED ORDER — AMISULPRIDE (ANTIEMETIC) 5 MG/2ML IV SOLN: 10.0000 mg | Freq: Once | INTRAVENOUS | Status: DC | PRN

## 2023-08-01 MED ORDER — HYDROCODONE-ACETAMINOPHEN 7.5-325 MG PO TABS: 1.0000 | ORAL_TABLET | ORAL | Status: DC | PRN

## 2023-08-01 MED ORDER — CHLORHEXIDINE GLUCONATE 0.12 % MT SOLN
15.0000 mL | Freq: Once | OROMUCOSAL | Status: AC
  Filled 2023-08-01: qty 15

## 2023-08-01 MED ORDER — EMPAGLIFLOZIN 25 MG PO TABS
25.0000 mg | ORAL_TABLET | Freq: Every day | ORAL | Status: DC
  Administered 2023-08-02: 25 mg via ORAL
  Filled 2023-08-01 (×3): qty 1

## 2023-08-01 MED ORDER — INSULIN GLARGINE-YFGN 100 UNIT/ML ~~LOC~~ SOLN
50.0000 [IU] | Freq: Every day | SUBCUTANEOUS | Status: DC
  Administered 2023-08-01: 50 [IU] via SUBCUTANEOUS
  Filled 2023-08-01 (×3): qty 0.5

## 2023-08-01 MED ORDER — METOCLOPRAMIDE HCL 5 MG/ML IJ SOLN: 5.0000 mg | Freq: Three times a day (TID) | INTRAMUSCULAR | Status: DC | PRN

## 2023-08-01 MED ORDER — ACETAMINOPHEN 325 MG PO TABS
325.0000 mg | ORAL_TABLET | Freq: Four times a day (QID) | ORAL | Status: DC | PRN
  Administered 2023-08-01 – 2023-08-02 (×2): 650 mg via ORAL
  Filled 2023-08-01: qty 2

## 2023-08-01 MED ORDER — MORPHINE SULFATE (PF) 2 MG/ML IV SOLN: 0.5000 mg | INTRAVENOUS | Status: DC | PRN

## 2023-08-01 MED ORDER — LIDOCAINE 2% (20 MG/ML) 5 ML SYRINGE
INTRAMUSCULAR | Status: AC
Start: 2023-08-01 — End: 2023-08-01
  Filled 2023-08-01: qty 5

## 2023-08-01 MED ORDER — ORAL CARE MOUTH RINSE
15.0000 mL | Freq: Once | OROMUCOSAL | Status: AC
  Administered 2023-08-01: 15 mL via OROMUCOSAL

## 2023-08-01 MED ORDER — HYDROCODONE-ACETAMINOPHEN 5-325 MG PO TABS
1.0000 | ORAL_TABLET | Freq: Four times a day (QID) | ORAL | 0 refills | Status: DC | PRN
  Filled 2023-08-01: qty 30, 8d supply, fill #0

## 2023-08-01 MED ORDER — SODIUM CHLORIDE 0.9 % IV SOLN: INTRAVENOUS | Status: DC

## 2023-08-01 MED ORDER — STERILE WATER FOR IRRIGATION IR SOLN
Status: DC | PRN
  Administered 2023-08-01: 1000 mL

## 2023-08-01 MED ORDER — MAGNESIUM GLUCONATE 500 (27 MG) MG PO TABS
500.0000 mg | ORAL_TABLET | Freq: Every day | ORAL | Status: DC
  Administered 2023-08-01: 500 mg via ORAL
  Filled 2023-08-01 (×3): qty 1

## 2023-08-01 MED ORDER — TRAMADOL HCL 50 MG PO TABS
50.0000 mg | ORAL_TABLET | Freq: Four times a day (QID) | ORAL | Status: DC
  Administered 2023-08-01 – 2023-08-02 (×2): 50 mg via ORAL
  Filled 2023-08-01 (×3): qty 1

## 2023-08-01 MED ORDER — ROPINIROLE HCL 0.5 MG PO TABS
1.0000 mg | ORAL_TABLET | Freq: Every day | ORAL | Status: DC
  Administered 2023-08-01: 1 mg via ORAL
  Filled 2023-08-01: qty 2

## 2023-08-01 MED ORDER — POLYETHYLENE GLYCOL 3350 17 GM/SCOOP PO POWD: 17.0000 g | Freq: Every day | ORAL | Status: DC | PRN

## 2023-08-01 MED ORDER — LIDOCAINE 2% (20 MG/ML) 5 ML SYRINGE
INTRAMUSCULAR | Status: DC | PRN
  Administered 2023-08-01: 40 mg via INTRAVENOUS

## 2023-08-01 MED ORDER — ACETAMINOPHEN 500 MG PO TABS
1000.0000 mg | ORAL_TABLET | Freq: Once | ORAL | Status: AC
  Administered 2023-08-01: 1000 mg via ORAL
  Filled 2023-08-01: qty 2

## 2023-08-01 MED ORDER — NITROGLYCERIN 0.4 MG SL SUBL: 0.4000 mg | SUBLINGUAL_TABLET | SUBLINGUAL | Status: DC | PRN

## 2023-08-01 MED ORDER — GABAPENTIN 300 MG PO CAPS
600.0000 mg | ORAL_CAPSULE | Freq: Three times a day (TID) | ORAL | Status: DC
  Administered 2023-08-01 – 2023-08-02 (×2): 600 mg via ORAL
  Filled 2023-08-01 (×3): qty 2

## 2023-08-01 MED ORDER — ASPIRIN 81 MG PO TBEC
81.0000 mg | DELAYED_RELEASE_TABLET | Freq: Every morning | ORAL | Status: DC
  Administered 2023-08-02: 81 mg via ORAL
  Filled 2023-08-01: qty 1

## 2023-08-01 MED ORDER — ONDANSETRON HCL 4 MG/2ML IJ SOLN
INTRAMUSCULAR | Status: DC | PRN
  Administered 2023-08-01: 4 mg via INTRAVENOUS

## 2023-08-01 MED ORDER — DEXAMETHASONE SODIUM PHOSPHATE 10 MG/ML IJ SOLN
INTRAMUSCULAR | Status: AC
  Filled 2023-08-01: qty 1

## 2023-08-01 MED ORDER — ANASTROZOLE 1 MG PO TABS
1.0000 mg | ORAL_TABLET | Freq: Every day | ORAL | Status: DC
  Administered 2023-08-01: 1 mg via ORAL
  Filled 2023-08-01 (×3): qty 1

## 2023-08-01 MED ORDER — PHENYLEPHRINE 80 MCG/ML (10ML) SYRINGE FOR IV PUSH (FOR BLOOD PRESSURE SUPPORT)
PREFILLED_SYRINGE | INTRAVENOUS | Status: AC
Start: 2023-08-01 — End: 2023-08-01
  Filled 2023-08-01: qty 10

## 2023-08-01 MED ORDER — LORATADINE 10 MG PO TABS
10.0000 mg | ORAL_TABLET | Freq: Every evening | ORAL | Status: DC
  Administered 2023-08-01: 10 mg via ORAL
  Filled 2023-08-01: qty 1

## 2023-08-01 MED ORDER — POTASSIUM 99 MG PO TABS: 99.0000 mg | ORAL_TABLET | Freq: Every day | ORAL | Status: DC

## 2023-08-01 MED ORDER — ESCITALOPRAM OXALATE 10 MG PO TABS
20.0000 mg | ORAL_TABLET | Freq: Every day | ORAL | Status: DC
  Administered 2023-08-01: 20 mg via ORAL
  Filled 2023-08-01: qty 2

## 2023-08-01 MED ORDER — DOCUSATE SODIUM 100 MG PO CAPS
100.0000 mg | ORAL_CAPSULE | Freq: Two times a day (BID) | ORAL | Status: DC
  Administered 2023-08-02: 100 mg via ORAL
  Filled 2023-08-01 (×2): qty 1

## 2023-08-01 MED ORDER — CEFAZOLIN SODIUM-DEXTROSE 2-4 GM/100ML-% IV SOLN
2.0000 g | INTRAVENOUS | Status: AC
  Administered 2023-08-01: 2 g via INTRAVENOUS
  Filled 2023-08-01: qty 100

## 2023-08-01 MED ORDER — AZELASTINE HCL 0.1 % NA SOLN
2.0000 | Freq: Two times a day (BID) | NASAL | Status: DC
  Filled 2023-08-01: qty 30

## 2023-08-01 MED ORDER — 0.9 % SODIUM CHLORIDE (POUR BTL) OPTIME
TOPICAL | Status: DC | PRN
Start: 2023-08-01 — End: 2023-08-01
  Administered 2023-08-01: 1000 mL

## 2023-08-01 MED ORDER — KETOCONAZOLE 2 % EX CREA: 1.0000 | TOPICAL_CREAM | Freq: Every day | CUTANEOUS | Status: DC | PRN

## 2023-08-01 MED ORDER — FENTANYL CITRATE (PF) 100 MCG/2ML IJ SOLN: 25.0000 ug | INTRAMUSCULAR | Status: DC | PRN

## 2023-08-01 MED ORDER — HYDROCODONE-ACETAMINOPHEN 5-325 MG PO TABS: 1.0000 | ORAL_TABLET | ORAL | Status: DC | PRN

## 2023-08-01 MED ORDER — EPHEDRINE SULFATE-NACL 50-0.9 MG/10ML-% IV SOSY
PREFILLED_SYRINGE | INTRAVENOUS | Status: DC | PRN
  Administered 2023-08-01 (×2): 7.5 mg via INTRAVENOUS
  Administered 2023-08-01: 5 mg via INTRAVENOUS

## 2023-08-01 MED ORDER — ALBUTEROL SULFATE HFA 108 (90 BASE) MCG/ACT IN AERS: 2.0000 | INHALATION_SPRAY | Freq: Four times a day (QID) | RESPIRATORY_TRACT | Status: DC | PRN

## 2023-08-01 MED ORDER — LACTATED RINGERS IV SOLN: INTRAVENOUS | Status: DC

## 2023-08-01 MED ORDER — GLIPIZIDE ER 10 MG PO TB24
20.0000 mg | ORAL_TABLET | Freq: Every morning | ORAL | Status: DC
  Administered 2023-08-02: 20 mg via ORAL
  Filled 2023-08-01: qty 2

## 2023-08-01 SURGICAL SUPPLY — 34 items
BAG COUNTER SPONGE SURGICOUNT (BAG) ×1 IMPLANT
BANDAGE ESMARK 6X9 LF (GAUZE/BANDAGES/DRESSINGS) IMPLANT
BIT DRILL 110X2.5XQCK CNCT (BIT) IMPLANT
BNDG COHESIVE 4X5 TAN STRL LF (GAUZE/BANDAGES/DRESSINGS) ×1 IMPLANT
BNDG COHESIVE 6X5 TAN ST LF (GAUZE/BANDAGES/DRESSINGS) IMPLANT
BNDG GAUZE DERMACEA FLUFF 4 (GAUZE/BANDAGES/DRESSINGS) ×1 IMPLANT
COVER SURGICAL LIGHT HANDLE (MISCELLANEOUS) ×1 IMPLANT
DRAPE OEC MINIVIEW 54X84 (DRAPES) IMPLANT
DRAPE U-SHAPE 47X51 STRL (DRAPES) ×1 IMPLANT
DRSG ADAPTIC 3X8 NADH LF (GAUZE/BANDAGES/DRESSINGS) ×1 IMPLANT
DURAPREP 26ML APPLICATOR (WOUND CARE) ×1 IMPLANT
ELECTRODE REM PT RTRN 9FT ADLT (ELECTROSURGICAL) ×1 IMPLANT
GAUZE PAD ABD 8X10 STRL (GAUZE/BANDAGES/DRESSINGS) ×1 IMPLANT
GAUZE SPONGE 4X4 12PLY STRL (GAUZE/BANDAGES/DRESSINGS) ×1 IMPLANT
GLOVE BIOGEL PI IND STRL 9 (GLOVE) ×1 IMPLANT
GLOVE SURG ORTHO 9.0 STRL STRW (GLOVE) ×1 IMPLANT
GOWN STRL REUS W/ TWL XL LVL3 (GOWN DISPOSABLE) ×3 IMPLANT
KIT BASIN OR (CUSTOM PROCEDURE TRAY) ×1 IMPLANT
KIT TURNOVER KIT B (KITS) ×1 IMPLANT
MANIFOLD NEPTUNE II (INSTRUMENTS) ×1 IMPLANT
NS IRRIG 1000ML POUR BTL (IV SOLUTION) ×1 IMPLANT
PACK ORTHO EXTREMITY (CUSTOM PROCEDURE TRAY) ×1 IMPLANT
PAD ARMBOARD POSITIONER FOAM (MISCELLANEOUS) ×2 IMPLANT
PLATE 7HOLE 1/3 TUBULAR (Plate) IMPLANT
SCREW CORTICAL 3.5 16MM (Screw) IMPLANT
SCREW CORTICAL 3.5 18MM (Screw) IMPLANT
SCREW CORTICAL 3.5X12 (Screw) IMPLANT
STAPLER SKIN PROX 35W (STAPLE) IMPLANT
SUCTION TUBE FRAZIER 10FR DISP (SUCTIONS) ×1 IMPLANT
SUT ETHILON 2 0 PSLX (SUTURE) IMPLANT
SUT VIC AB 2-0 CT1 TAPERPNT 27 (SUTURE) ×1 IMPLANT
TOWEL GREEN STERILE (TOWEL DISPOSABLE) ×1 IMPLANT
TOWEL GREEN STERILE FF (TOWEL DISPOSABLE) ×1 IMPLANT
TUBE CONNECTING 12X1/4 (SUCTIONS) ×1 IMPLANT

## 2023-08-01 NOTE — Interval H&P Note (Signed)
 History and Physical Interval Note:  08/01/2023 9:06 AM  Felicia [name]  has presented today for surgery, with the diagnosis of distal fibula fracture.  The various methods of treatment have been discussed with the patient and family. After consideration of risks, benefits and other options for treatment, the patient has consented to  Procedure(s) with comments: OPEN REDUCTION INTERNAL FIXATION (ORIF) ANKLE FRACTURE (Right) - RIGHT ORIF DISTAL FIBULA FRACTURE as a surgical intervention.  The patient's history has been reviewed, patient examined, no change in status, stable for surgery.  I have reviewed the patient's chart and labs.  Questions were answered to the patient's satisfaction.     Kian Gamarra V Bralyn Espino

## 2023-08-01 NOTE — Progress Notes (Signed)
 Orthopedic Tech Progress Note Patient Details:  Felicia [name] 1950/10/29 [id number]  Ortho Devices Type of Ortho Device: Crutches Ortho Device/Splint Location: Will do orthotic traing when out of anesthesia Ortho Device/Splint Interventions: Ordered   Post Interventions Patient Tolerated: Well  [name] 08/01/2023, 12:13 PM

## 2023-08-01 NOTE — OR Nursing (Signed)
 MINI C-ARM ORDER #[id number]

## 2023-08-01 NOTE — Anesthesia Procedure Notes (Signed)
 Procedure Name: LMA Insertion Date/Time: 08/01/2023 11:25 AM  Performed by: Elby Raelene SAUNDERS, CRNAPre-anesthesia Checklist: Patient identified, Emergency Drugs available, Suction available and Patient being monitored Patient Re-evaluated:Patient Re-evaluated prior to induction Oxygen Delivery Method: Circle System Utilized Preoxygenation: Pre-oxygenation with 100% oxygen Induction Type: IV induction Ventilation: Mask ventilation without difficulty LMA: LMA inserted LMA Size: 4.0 Number of attempts: 1 Airway Equipment and Method: Bite block Placement Confirmation: positive ETCO2 Tube secured with: Tape Dental Injury: Teeth and Oropharynx as per pre-operative assessment

## 2023-08-01 NOTE — Op Note (Signed)
 08/01/2023  11:50 AM  PATIENT:  Felicia [name]    PRE-OPERATIVE DIAGNOSIS:  distal fibula fracture  POST-OPERATIVE DIAGNOSIS:  Same  PROCEDURE:  OPEN REDUCTION INTERNAL FIXATION (ORIF) ANKLE FRACTURE, RIGHT  C arm fluoroscopy to verify reduction.  SURGEON:  Jerona LULLA Sage, MD  PHYSICIAN ASSISTANT:None ANESTHESIA:   General  PREOPERATIVE INDICATIONS:  [name] is a  73 y.o. female with a diagnosis of distal fibula fracture who failed conservative measures and elected for surgical management.    The risks benefits and alternatives were discussed with the patient preoperatively including but not limited to the risks of infection, bleeding, nerve injury, cardiopulmonary complications, the need for revision surgery, among others, and the patient was willing to proceed.  OPERATIVE IMPLANTS:   Implant Name Type Inv. Item Serial No. Manufacturer Lot No. LRB No. Used Action  SCREW CORTICAL 3.5 - [id number] Screw SCREW CORTICAL 3.5  [employer](ORTH,TRAU,BIO,SG)  Right 1 Implanted  PLATE 7HOLE 1/3 TUBULAR - [id number] Plate PLATE 7HOLE 1/3 TUBULAR  [employer](ORTH,TRAU,BIO,SG)  Right 1 Implanted  SCREW CORTICAL 3.5X12 - [id number] Screw SCREW CORTICAL 3.5X12  [employer](ORTH,TRAU,BIO,SG)  Right 2 Implanted  SCREW CORTICAL 3.5 - [id number] Screw SCREW CORTICAL 3.5  [employer](ORTH,TRAU,BIO,SG)  Right 2 Implanted    @ENCIMAGES @  OPERATIVE FINDINGS: C arm fluoroscopy verified congruent mortise with the fibula out to length.  OPERATIVE PROCEDURE: Patient was brought the operating room and underwent general anesthetic.  After adequate levels anesthesia were obtained patient's right lower extremity was prepped using DuraPrep draped into a sterile field a timeout was called.  An incision was made laterally over the fibula and this was carried down to bone.  Subperiosteal dissection was used to identify the fracture.  The fracture was freshened and irrigated.  The  fibula was pulled out to length and an interfrag screw was used to stabilize the reduction.  A antiglide one third fibular plate was placed posterior laterally to further stabilize the fracture.  2 compression screws were placed proximally and distally.  C-arm fluoroscopy verified reduction.  The wound was irrigated with normal saline incision closed using 2-0 nylon and a sterile dressing was applied patient was extubated taken the PACU in stable condition.   DISCHARGE PLANNING:  Antibiotic duration: Preoperative antibiotics  Weightbearing: Touchdown weightbearing on the right  Pain medication: Prescription for Vicodin  Dressing care/ Wound VAC: Dry dressing change and follow-up  Ambulatory devices: Walker  Discharge to: Home.  Follow-up: In the office 1 week post operative.

## 2023-08-01 NOTE — Discharge Instructions (Addendum)
 Cam boot, touch down weight bearing for balance post op until f/u 1 week. Will need a walker.  Elevate you leg above you hear to reduce swelling and pain.

## 2023-08-01 NOTE — Transfer of Care (Signed)
 Immediate Anesthesia Transfer of Care Note  Patient: Felicia [name]  Procedure(s) Performed: OPEN REDUCTION INTERNAL FIXATION (ORIF) ANKLE FRACTURE, RIGHT (Right: Ankle)  Patient Location: PACU  Anesthesia Type:General and Regional  Level of Consciousness: awake, alert , and oriented  Airway & Oxygen Therapy: Patient Spontanous Breathing and Patient connected to nasal cannula oxygen  Post-op Assessment: Report given to RN and Post -op Vital signs reviewed and stable  Post vital signs: Reviewed and stable  Last Vitals:  Vitals Value Taken Time  BP 136/74 08/01/23 12:02  Temp    Pulse 83 08/01/23 12:03  Resp 22 08/01/23 12:03  SpO2 90 % 08/01/23 12:03  Vitals shown include unfiled device data.  Last Pain:  Vitals:   08/01/23 0746  TempSrc:   PainSc: 6       Patients Stated Pain Goal: 5 (08/01/23 0746)  Complications: No notable events documented.

## 2023-08-01 NOTE — Progress Notes (Signed)
 Orthopedic Tech Progress Note Patient Details:  Felicia [name] Mar 23, 1950 [id number]  Patient ID: Felicia [name], female   DOB: 1950/09/17, 73 y.o.   MRN: [id number] Pt. Has boot  (from home)and crutches. Will use rollator for walking. Felicia [name] 08/01/2023, 6:32 PM

## 2023-08-02 DIAGNOSIS — J449 Chronic obstructive pulmonary disease, unspecified: Secondary | ICD-10-CM | POA: Diagnosis not present

## 2023-08-02 DIAGNOSIS — Z853 Personal history of malignant neoplasm of breast: Secondary | ICD-10-CM | POA: Diagnosis not present

## 2023-08-02 DIAGNOSIS — E119 Type 2 diabetes mellitus without complications: Secondary | ICD-10-CM | POA: Diagnosis not present

## 2023-08-02 DIAGNOSIS — I2510 Atherosclerotic heart disease of native coronary artery without angina pectoris: Secondary | ICD-10-CM | POA: Diagnosis not present

## 2023-08-02 DIAGNOSIS — S82831A Other fracture of upper and lower end of right fibula, initial encounter for closed fracture: Secondary | ICD-10-CM | POA: Diagnosis not present

## 2023-08-02 DIAGNOSIS — I10 Essential (primary) hypertension: Secondary | ICD-10-CM | POA: Diagnosis not present

## 2023-08-02 DIAGNOSIS — Z96642 Presence of left artificial hip joint: Secondary | ICD-10-CM | POA: Diagnosis not present

## 2023-08-02 DIAGNOSIS — Z955 Presence of coronary angioplasty implant and graft: Secondary | ICD-10-CM | POA: Diagnosis not present

## 2023-08-02 DIAGNOSIS — Z96651 Presence of right artificial knee joint: Secondary | ICD-10-CM | POA: Diagnosis not present

## 2023-08-02 DIAGNOSIS — E039 Hypothyroidism, unspecified: Secondary | ICD-10-CM | POA: Diagnosis not present

## 2023-08-02 NOTE — Evaluation (Signed)
 Physical Therapy Evaluation Patient Details Name: Felicia [name] MRN: [id number] DOB: 01-09-1951 Today's Date: 08/02/2023  History of Present Illness  Felicia [name] is a 73 y.o. female with a diagnosis of distal fibula fracture who failed conservative measures and elected for surgical management. Pt s/p Rt ankle ORIF 7/18. PMHx: CAD, breast cancer, DM, GERD, HTN, and HLD.  Clinical Impression  Pt admitted with above diagnosis. PTA, pt was modI with functional mobility using a rollator and independent with ADLs/IADLs. She lives alone in a handicap accessible apartment in a [place] community. Pt currently with functional limitations due to the deficits listed below (see PT Problem List). She performed bed mobility, transfers, and gait using RW with supervision for safety. Pt ambulated within the room well, without LOB, and maintained WBing precaution. Pt will benefit from acute skilled PT to increase her independence and safety with mobility to allow discharge. Recommend HHPT to increase ROM/strength, improve endurance, decrease fall risk, and optimize safety within the home environment.       If plan is discharge home, recommend the following: A little help with walking and/or transfers;A little help with bathing/dressing/bathroom;Assist for transportation;Help with stairs or ramp for entrance   Can travel by private vehicle        Equipment Recommendations Rolling walker (2 wheels)  Recommendations for Other Services       Functional Status Assessment Patient has had a recent decline in their functional status and demonstrates the ability to make significant improvements in function in a reasonable and predictable amount of time.     Precautions / Restrictions Precautions Precautions: Fall Recall of Precautions/Restrictions: Intact Restrictions Weight Bearing Restrictions Per Provider Order: Yes RLE Weight Bearing Per Provider Order: Touchdown weight bearing (Per Dr. Harden Orthopedic Op Note  7/18)      Mobility  Bed Mobility Overal bed mobility: Needs Assistance Bed Mobility: Supine to Sit     Supine to sit: HOB elevated, Supervision, Used rails     General bed mobility comments: Pt sat up on R side of bed. No physical assist or cues for sequencing.    Transfers Overall transfer level: Needs assistance Equipment used: Rolling walker (2 wheels) Transfers: Sit to/from Stand, Bed to chair/wheelchair/BSC Sit to Stand: Supervision   Step pivot transfers: Supervision       General transfer comment: Pt stood from lowest bed height and commode. Cued proper hand placement using RW. She powered up without physical assist. Good eccentric control with sitting.    Ambulation/Gait Ambulation/Gait assistance: Supervision Gait Distance (Feet): 40 Feet Assistive device: Rolling walker (2 wheels) Gait Pattern/deviations: Step-to pattern, Decreased stride length Gait velocity: decreased Gait velocity interpretation: <1.31 ft/sec, indicative of household ambulator   General Gait Details: Pt ambulated using a hopping technique she maintained RLE primarily NWB but intermittently rested her toes on the ground. Pt advanced LLE by increasing WBing through BUE support on RW. She took small controlled steps and navigated the room well without LOB.  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 3 falls in the past 58mo, one resulting in fx and reason for admission. ADLs Comments: Indep with ADLs/IADLs. Can drive, but doesn't have a car. Her neighbor will allow her to borrow it or she does errands with friends/family.     Extremity/Trunk Assessment   Upper Extremity Assessment Upper Extremity Assessment: Overall WFL for tasks assessed    Lower Extremity Assessment Lower Extremity Assessment: RLE deficits/detail (LLE overall WFL for tasks assessed.) RLE Deficits / Details: Hip/Knee grossly 3/5 strength. Pt with dressing around lower leg to foot. She can wiggle her toes. RLE: Unable to fully assess due to pain RLE Sensation: decreased light touch;decreased proprioception RLE Coordination: decreased gross motor    Cervical / Trunk Assessment Cervical / Trunk Assessment: Normal  Communication   Communication Communication: No apparent difficulties    Cognition Arousal: Alert Behavior During Therapy: WFL for tasks assessed/performed   PT - Cognitive impairments: No apparent impairments                       PT - Cognition Comments: Pt A,Ox4. Following commands: Intact       Cueing Cueing Techniques: Verbal cues     General Comments      Exercises     Assessment/Plan    PT Assessment Patient needs continued PT services  PT Problem List Decreased strength;Decreased range of motion;Decreased activity tolerance;Decreased balance;Decreased mobility       PT Treatment Interventions DME instruction;Gait training;Functional mobility training;Therapeutic activities;Therapeutic exercise;Balance training;Patient/family education    PT Goals (Current goals can be found in the Care Plan section)   Acute Rehab PT Goals Patient Stated Goal: Return Home PT Goal Formulation: With patient Time For Goal Achievement: 08/09/23 Potential to Achieve Goals: Good    Frequency Min 2X/week     Co-evaluation               AM-PAC PT 6 Clicks Mobility  Outcome Measure Help needed turning from your back to your side while in a flat bed without using bedrails?: A Little Help needed moving from lying on your back to sitting on the side of a flat bed without using bedrails?: A Little Help needed moving to and from a bed to a chair (including a wheelchair)?: A Little Help needed standing up from a chair using your arms (e.g., wheelchair or bedside chair)?: A Little Help needed to walk in hospital room?: A Little Help needed climbing 3-5 steps with a railing? : A Lot 6 Click Score: 17    End of Session Equipment Utilized During Treatment: Gait belt Activity Tolerance: Patient tolerated treatment well Patient left: in chair;with call bell/phone within reach Nurse Communication: Mobility status PT Visit Diagnosis: Difficulty in walking, not elsewhere classified (R26.2);Unsteadiness on feet (R26.81)    Time: 9176-9159 PT Time Calculation (min) (ACUTE ONLY): 17 min   Charges:   PT Evaluation $PT Eval Low Complexity: 1 Low  PT General Charges $$ ACUTE PT VISIT: 1 Visit         Randall SAUNDERS, PT, DPT Acute Rehabilitation Services Office: [phone] Secure Chat Preferred  Delon CHRISTELLA Callander 08/02/2023, 9:15 AM

## 2023-08-02 NOTE — Discharge Summary (Signed)
 Physician Discharge Summary  Patient ID: Felicia [name] MRN: [id number] DOB/AGE: [birth year] 73 y.o.  Admit date: 08/01/2023 Discharge date: 08/02/2023  Admission Diagnoses:  Principal Problem:   Ankle fracture Active Problems:   Closed fracture of right ankle   Discharge Diagnoses:  Same  Past Medical History:  Diagnosis Date   Adenomatous colon polyp 2015   Anemia    Anxiety    Breast cancer (HCC) 09/2019   left breast IMC   CAD (coronary artery disease)    a. 3/2007s/p DES to the LAD ([place] ); b. 01/2017 MV: EF 80%, small, mild apical ant defect w/o ischemia (felt to be breast atten). Low risk.   Depression    DM (diabetes mellitus) (HCC)    Emphysema lung (HCC)    Essential hypertension    GERD (gastroesophageal reflux disease)    Headache    secondary to neck surgery per patient   High triglycerides    HTN (hypertension)    Hyperlipidemia    Hypothyroid    OA (osteoarthritis)    right knee,hands   Overflow incontinence    Pneumonia 2003   PONV (postoperative nausea and vomiting)    s/p gallbladder    RLS (restless legs syndrome)    Urinary urgency    Uterine cancer (HCC) dx'd 2014    Surgeries: Procedure(s): OPEN REDUCTION INTERNAL FIXATION (ORIF) ANKLE FRACTURE, RIGHT on 08/01/2023   Consultants:   Discharged Condition: Improved  Hospital Course: Felicia [name] is an 73 y.o. female who was admitted 08/01/2023 with a chief complaint of No chief complaint on file. , and found to have a diagnosis of Ankle fracture.  They were brought to the operating room on 08/01/2023 and underwent the above named procedures.    They were given perioperative antibiotics:  Anti-infectives (From admission, onward)    Start     Dose/Rate Route Frequency Ordered Stop   08/01/23 0700  ceFAZolin  (ANCEF ) IVPB 2g/100 mL premix        2 g 200 mL/hr over 30 Minutes Intravenous On call to O.R. 08/01/23 9340 08/01/23 1114     .  They were given compression stockings,  early ambulation, and chemoprophylaxis for DVT prophylaxis.  They benefited maximally from their hospital stay and there were no complications.    Recent vital signs:  Vitals:   08/02/23 0459 08/02/23 0744  BP: (P) 133/68 (!) 97/59  Pulse: (P) 87 85  Resp: (P) 18 18  Temp: (P) 98.1 F (36.7 C) 98.2 F (36.8 C)  SpO2: (P) 99% 95%    Recent laboratory studies:  Results for orders placed or performed during the hospital encounter of 08/01/23  Surgical pcr screen   Collection Time: 08/01/23  7:05 AM   Specimen: Nasal Mucosa; Nasal Swab  Result Value Ref Range   MRSA, PCR NEGATIVE NEGATIVE   Staphylococcus aureus NEGATIVE NEGATIVE  Glucose, capillary   Collection Time: 08/01/23  7:11 AM  Result Value Ref Range   Glucose-Capillary 231 (H) 70 - 99 mg/dL  CBC WITH DIFFERENTIAL   Collection Time: 08/01/23  7:40 AM  Result Value Ref Range   WBC 19.4 (H) 4.0 - 10.5 K/uL   RBC 4.45 3.87 - 5.11 MIL/uL   Hemoglobin 13.9 12.0 - 15.0 g/dL   HCT 57.1 63.9 - 53.9 %   MCV 96.2 80.0 - 100.0 fL   MCH 31.2 26.0 - 34.0 pg   MCHC 32.5 30.0 - 36.0 g/dL   RDW 85.1 88.4 - 84.4 %   Platelets 283  150 - 400 K/uL   nRBC 0.0 0.0 - 0.2 %   Neutrophils Relative % 45 %   Neutro Abs 8.7 (H) 1.7 - 7.7 K/uL   Lymphocytes Relative 50 %   Lymphs Abs 9.7 (H) 0.7 - 4.0 K/uL   Monocytes Relative 1 %   Monocytes Absolute 0.2 0.1 - 1.0 K/uL   Eosinophils Relative 4 %   Eosinophils Absolute 0.8 (H) 0.0 - 0.5 K/uL   Basophils Relative 0 %   Basophils Absolute 0.0 0.0 - 0.1 K/uL   WBC Morphology See Note    RBC Morphology See Note    Smear Review See Note    nRBC 0 0 /100 WBC   Abs Immature Granulocytes 0.00 0.00 - 0.07 K/uL  Comprehensive metabolic panel   Collection Time: 08/01/23  7:40 AM  Result Value Ref Range   Sodium 135 135 - 145 mmol/L   Potassium 4.3 3.5 - 5.1 mmol/L   Chloride 101 98 - 111 mmol/L   CO2 21 (L) 22 - 32 mmol/L   Glucose, Bld 221 (H) 70 - 99 mg/dL   BUN 21 8 - 23 mg/dL    Creatinine, Ser 9.18 0.44 - 1.00 mg/dL   Calcium  10.1 8.9 - 10.3 mg/dL   Total Protein 6.6 6.5 - 8.1 g/dL   Albumin 3.9 3.5 - 5.0 g/dL   AST 25 15 - 41 U/L   ALT 19 0 - 44 U/L   Alkaline Phosphatase 50 38 - 126 U/L   Total Bilirubin 0.7 0.0 - 1.2 mg/dL   GFR, Estimated >39 >39 mL/min   Anion gap 13 5 - 15  Glucose, capillary   Collection Time: 08/01/23  9:45 AM  Result Value Ref Range   Glucose-Capillary 209 (H) 70 - 99 mg/dL  Glucose, capillary   Collection Time: 08/01/23 12:14 PM  Result Value Ref Range   Glucose-Capillary 193 (H) 70 - 99 mg/dL  CBC   Collection Time: 08/01/23  6:31 PM  Result Value Ref Range   WBC 20.9 (H) 4.0 - 10.5 K/uL   RBC 4.71 3.87 - 5.11 MIL/uL   Hemoglobin 14.5 12.0 - 15.0 g/dL   HCT 54.9 63.9 - 53.9 %   MCV 95.5 80.0 - 100.0 fL   MCH 30.8 26.0 - 34.0 pg   MCHC 32.2 30.0 - 36.0 g/dL   RDW 85.1 88.4 - 84.4 %   Platelets 302 150 - 400 K/uL   nRBC 0.0 0.0 - 0.2 %  Creatinine, serum   Collection Time: 08/01/23  6:31 PM  Result Value Ref Range   Creatinine, Ser 1.02 (H) 0.44 - 1.00 mg/dL   GFR, Estimated 58 (L) >60 mL/min  Glucose, capillary   Collection Time: 08/01/23  6:33 PM  Result Value Ref Range   Glucose-Capillary 294 (H) 70 - 99 mg/dL  Glucose, capillary   Collection Time: 08/01/23 11:00 PM  Result Value Ref Range   Glucose-Capillary 320 (H) 70 - 99 mg/dL   *Note: Due to a large number of results and/or encounters for the requested time period, some results have not been displayed. A complete set of results can be found in Results Review.    Discharge Medications:   Allergies as of 08/02/2023       Reactions   Chloraprep One Step [chlorhexidine  Gluconate] Rash   Estrogens Other (See Comments)   PATIENT HAS A HISTORY OF CANCER AND HAS BEEN TOLD TO NEVER TAKE ANYTHING CONTAINING ESTROGEN, AS IT MIGHT CAUSE A RECURRENCE  Shellfish-derived Products Nausea And Vomiting   Shrimp [shellfish Allergy] Nausea And Vomiting         Medication List     STOP taking these medications    traMADol  50 MG tablet Commonly known as: ULTRAM        TAKE these medications    Accu-Chek Guide test strip Generic drug: glucose blood USE TO check blood glucose UP TO four times daily AS DIRECTED   Accu-Chek Guide Test test strip Generic drug: glucose blood Use to check blood sugar up to four times daily as directed   Accu-Chek Softclix Lancets lancets USE TO check blood glucose UP TO four times daily AS DIRECTED   Accu-Chek Softclix Lancets lancets Use to check blood sugar up to four times daily as directed   acetaminophen  500 MG tablet Commonly known as: TYLENOL  Take 500 mg by mouth every 6 (six) hours as needed for mild pain (pain score 1-3) or moderate pain (pain score 4-6).   albuterol  108 (90 Base) MCG/ACT inhaler Commonly known as: VENTOLIN  HFA Inhale 2 puffs into the lungs every 6 (six) hours as needed for wheezing or shortness of breath.   anastrozole  1 MG tablet Commonly known as: ARIMIDEX  Take 1 tablet (1 mg total) by mouth daily. What changed: when to take this   ascorbic acid 500 MG tablet Commonly known as: VITAMIN C Take 500 mg by mouth daily.   Aspirin  Low Dose 81 MG tablet Generic drug: aspirin  EC Take 1 tablet (81 mg total) by mouth in the morning.   Azelastine  HCl 137 MCG/SPRAY Soln Place 2 sprays into both nostrils 2 (two) times daily. Use in each nostril as directed   bisoprolol  5 MG tablet Commonly known as: ZEBETA  Take 1 tablet (5 mg total) by mouth at bedtime.   blood glucose meter kit and supplies Dispense based on patient and insurance preference. Use up to four times daily as directed. (FOR ICD-10 E10.9, E11.9).   buPROPion  150 MG 24 hr tablet Commonly known as: WELLBUTRIN  XL Take 1 tablet (150 mg total) by mouth every morning for depression.   celecoxib  200 MG capsule Commonly known as: CELEBREX  Take 1 capsule (200 mg total) by mouth 2 (two) times daily.   CINNAMON  PO Take 600 mg by mouth daily with supper.   clotrimazole -betamethasone  cream Commonly known as: LOTRISONE Apply 1 Application topically daily as needed (Rash).   Depend Adjustable Underwear Misc 1 Application by Does not apply route daily.   diclofenac  sodium 1 % Gel Commonly known as: VOLTAREN  Apply 2-4 g topically 4 (four) times daily as needed (as directed for pain).   escitalopram  20 MG tablet Commonly known as: LEXAPRO  Take 1 tablet (20 mg total) by mouth daily. What changed: when to take this   fenofibrate  145 MG tablet Commonly known as: TRICOR  Take 1 tablet (145 mg total) by mouth at bedtime.   FIBER PO Take 1 tablet by mouth daily.   gabapentin  600 MG tablet Commonly known as: NEURONTIN  Take 1 tablet (600 mg total) by mouth 3 (three) times daily.   glipiZIDE  10 MG 24 hr tablet Commonly known as: GLUCOTROL  XL Take 2 tablets (20 mg total) by mouth every morning.   HYDROcodone -acetaminophen  5-325 MG tablet Commonly known as: NORCO/VICODIN Take 1 tablet by mouth every 6 (six) hours as needed.   Insupen Pen Needles 32G X 4 MM Misc Generic drug: Insulin  Pen Needle Use to inject insulin  as directed   IRON PO Take 1 tablet by mouth daily.  alternates days:1 tablet one day and 2 tablets the next day   Jardiance  25 MG Tabs tablet Generic drug: empagliflozin  Take 1 tablet (25 mg total) by mouth daily.   ketoconazole  2 % cream Commonly known as: NIZORAL  Apply 1 Application topically daily. What changed:  when to take this reasons to take this   Lantus  SoloStar 100 UNIT/ML Solostar Pen Generic drug: insulin  glargine Start with injecting 4 units under the skin in the evenings. Increase by 2 units every 2 days until monring fasting glucose is to goal (140). Max units 60 per day. What changed:  how much to take how to take this when to take this additional instructions   levothyroxine  125 MCG tablet Commonly known as: SYNTHROID  Take 1 tablet (125 mcg total)  by mouth daily before breakfast.   loratadine  10 MG tablet Commonly known as: Allergy Relief Take 1 tablet (10 mg total) by mouth every evening.   magnesium  gluconate 500 (27 Mg) MG Tabs tablet Commonly known as: MAGONATE Take 500 mg by mouth at bedtime.   mupirocin  cream 2 % Commonly known as: BACTROBAN  Apply 1 Application topically 3 (three) times daily. What changed:  when to take this reasons to take this   nitroGLYCERIN  0.4 MG SL tablet Commonly known as: NITROSTAT  Dissolve 1 tablet under the tongue every 5 minutes as needed for chest pain. Max of 3 doses, then 911.   omeprazole  40 MG capsule Commonly known as: PRILOSEC Take 1 capsule (40 mg total) by mouth every evening.   ONE-A-DAY WOMENS PO Take 1 tablet by mouth daily.   Ozempic  (0.25 or 0.5 MG/DOSE) 2 MG/3ML Sopn Generic drug: Semaglutide (0.25 or 0.5MG /DOS) Inject 0.25 mg into the skin once a week for 28 days, THEN 0.5 mg once a week for 28 days. Start taking on: July 08, 2023   polyethylene glycol powder 17 GM/SCOOP powder Commonly known as: MiraLax  Take 17 g by mouth daily. What changed:  when to take this reasons to take this   Potassium 99 MG Tabs Take 99 mg by mouth at bedtime.   rOPINIRole  1 MG tablet Commonly known as: REQUIP  Take 1 tablet (1 mg total) by mouth at bedtime.   rosuvastatin  40 MG tablet Commonly known as: Crestor  Take 1 tablet (40 mg total) by mouth daily.   Stiolto Respimat  2.5-2.5 MCG/ACT Aers Generic drug: Tiotropium Bromide-Olodaterol Inhale 2 puffs into the lungs daily.   tiZANidine  2 MG tablet Commonly known as: ZANAFLEX  Take 1 tablet (2 mg total) by mouth 3 (three) times daily.   triamcinolone  0.1 % paste Commonly known as: KENALOG  Apply a small amount to affected area twice daily for 2 - 3 weeks. What changed:  how much to take when to take this reasons to take this   TURMERIC PO Take 450 mg by mouth daily with supper.               Durable Medical  Equipment  (From admission, onward)           Start     Ordered   08/01/23 1130  For home use only DME Crutches  Once        08/01/23 1130   08/01/23 1130  For home use only DME Walker rolling  Once       Comments: Crutches verse walker what ever patient wants and is safe.  Question Answer Comment  Walker: With 5 Inch Wheels   Patient needs a walker to treat with the following condition Post-operative state  08/01/23 1130              Discharge Care Instructions  (From admission, onward)           Start     Ordered   08/02/23 0000  Touch down weight bearing       Question Answer Comment  Laterality right   Extremity Lower      08/02/23 1003            Diagnostic Studies: DG MINI C-ARM IMAGE ONLY Result Date: 08/01/2023 There is no interpretation for this exam.  This order is for images obtained during a surgical procedure.  Please See Surgeries Tab for more information regarding the procedure.    Disposition: Discharge disposition: 01-Home or Self Care       Discharge Instructions     Call MD / Call 911   Complete by: As directed    If you experience chest pain or shortness of breath, CALL 911 and be transported to the hospital emergency room.  If you develope a fever above 101 F, pus (white drainage) or increased drainage or redness at the wound, or calf pain, call your surgeon's office.   Call MD for:  redness, tenderness, or signs of infection (pain, swelling, bleeding, redness, odor or green/yellow discharge around incision site)   Complete by: As directed    Call MD for:  severe or increased pain, loss or decreased feeling  in affected limb(s)   Complete by: As directed    Call MD for:  temperature >100.5   Complete by: As directed    Constipation Prevention   Complete by: As directed    Drink plenty of fluids.  Prune juice may be helpful.  You may use a stool softener, such as Colace (over the counter) 100 mg twice a day.  Use MiraLax   (over the counter) for constipation as needed.   Diet - low sodium heart healthy   Complete by: As directed    Discharge instructions   Complete by: As directed    Touch down weight bearing for balance,wear cam boot for protection at all times.  Elevate your foot above your heat laying down to reduce swelling and pain.  We will change the dressing at your first follow up visit.  Keep dressing clean and dry.  F/U in 1 week call for appointment.   Increase activity slowly as tolerated   Complete by: As directed    Post-operative opioid taper instructions:   Complete by: As directed    POST-OPERATIVE OPIOID TAPER INSTRUCTIONS: It is important to wean off of your opioid medication as soon as possible. If you do not need pain medication after your surgery it is ok to stop day one. Opioids include: Codeine, Hydrocodone (Norco, Vicodin), Oxycodone (Percocet, oxycontin ) and hydromorphone  amongst others.  Long term and even short term use of opiods can cause: Increased pain response Dependence Constipation Depression Respiratory depression And more.  Withdrawal symptoms can include Flu like symptoms Nausea, vomiting And more Techniques to manage these symptoms Hydrate well Eat regular healthy meals Stay active Use relaxation techniques(deep breathing, meditating, yoga) Do Not substitute Alcohol to help with tapering If you have been on opioids for less than two weeks and do not have pain than it is ok to stop all together.  Plan to wean off of opioids This plan should start within one week post op of your joint replacement. Maintain the same interval or time between taking each dose and first decrease the dose.  Cut the total daily intake of opioids by one tablet each day Next start to increase the time between doses. The last dose that should be eliminated is the evening dose.      Resume previous diet   Complete by: As directed    Touch down weight bearing   Complete by: As  directed    Laterality: right   Extremity: Lower   Walk with assistance   Complete by: As directed    Walker    Complete by: As directed         Follow-up Information     Harden Jerona GAILS, MD Follow up in 1 week(s).   Specialty: Orthopedic Surgery Contact information: [address] [place]  [place] [address] [phone]                  Signed: Jerona GAILS Harden 08/02/2023, 10:03 AM

## 2023-08-02 NOTE — Care Management Obs Status (Signed)
 MEDICARE OBSERVATION STATUS NOTIFICATION   Patient Details  Name: Felicia [name] MRN: [id number] Date of Birth: 1951-01-08   Medicare Observation Status Notification Given:  Yes    Robynn Eileen Hoose, RN 08/02/2023, 8:57 AM

## 2023-08-02 NOTE — Progress Notes (Signed)
 Patient ID: Felicia [name], female   DOB: Oct 08, 1950, 73 y.o.   MRN: [id number] Patient is postoperative day 1 open reduction internal fixation right Weber B fibular fracture.  The dressing is clean and dry patient is sitting up in the bedside chair.  Plan for discharge to home today.  Minimize weightbearing use the fracture boot for ambulation.

## 2023-08-02 NOTE — TOC Transition Note (Signed)
 Transition of Care [facility]) - Discharge Note   Patient Details  Name: Felicia [name] MRN: [id number] Date of Birth: [birth year]  [facility] CM/SW Contact:  Robynn Eileen Hoose, RN Phone Number: 08/02/2023, 7:50 AM   Clinical Narrative:   Patient is being discharged home today. Spoke with patient, confirmed that she has rollator and rolling walker at home. Patient has ride home.    Final next level of care: Home/Self Care Barriers to Discharge: No Barriers Identified   Patient Goals and CMS Choice            Discharge Placement                       Discharge Plan and Services Additional resources added to the After Visit Summary for                                       Social Drivers of Health (SDOH) Interventions SDOH Screenings   Food Insecurity: No Food Insecurity (08/01/2023)  Housing: Low Risk  (08/01/2023)  Transportation Needs: No Transportation Needs (08/01/2023)  Utilities: Not At Risk (08/01/2023)  Alcohol Screen: Low Risk  (07/01/2023)  Depression (PHQ2-9): High Risk (07/08/2023)  Financial Resource Strain: Low Risk  (07/01/2023)  Physical Activity: Inactive (07/01/2023)  Social Connections: Socially Isolated (08/01/2023)  Stress: Stress Concern Present (07/01/2023)  Tobacco Use: Low Risk  (08/01/2023)  Health Literacy: Adequate Health Literacy (07/01/2023)     Readmission Risk Interventions     No data to display

## 2023-08-03 MED ORDER — DEXAMETHASONE SODIUM PHOSPHATE 10 MG/ML IJ SOLN
INTRAMUSCULAR | Status: DC | PRN
Start: 2023-08-01 — End: 2023-08-03
  Administered 2023-08-01 (×2): 5 mg

## 2023-08-03 MED ORDER — ROPIVACAINE HCL 5 MG/ML IJ SOLN
INTRAMUSCULAR | Status: DC | PRN
  Administered 2023-08-01: 15 mL via PERINEURAL
  Administered 2023-08-01: 30 mL via PERINEURAL

## 2023-08-03 NOTE — Anesthesia Procedure Notes (Signed)
 Anesthesia Regional Block: Popliteal block   Pre-Anesthetic Checklist: , timeout performed,  Correct Patient, Correct Site, Correct Laterality,  Correct Procedure, Correct Position, site marked,  Risks and benefits discussed,  Pre-op evaluation,  At surgeon's request and post-op pain management  Laterality: Right  Prep: Maximum Sterile Barrier Precautions used, chloraprep       Needles:  Injection technique: Single-shot  Needle Type: Echogenic Stimulator Needle     Needle Length: 9cm  Needle Gauge: 21     Additional Needles:   Procedures:,,,, ultrasound used (permanent image in chart),,    Narrative:  Start time: 08/01/2023 10:08 AM End time: 08/01/2023 10:12 AM Injection made incrementally with aspirations every 5 mL. Anesthesiologist: Niels Marien CROME, MD

## 2023-08-03 NOTE — Anesthesia Postprocedure Evaluation (Signed)
 Anesthesia Post Note  Patient: Felicia [name]  Procedure(s) Performed: OPEN REDUCTION INTERNAL FIXATION (ORIF) ANKLE FRACTURE, RIGHT (Right: Ankle)     Patient location during evaluation: PACU Anesthesia Type: Regional and General Level of consciousness: awake and alert Pain management: pain level controlled Vital Signs Assessment: post-procedure vital signs reviewed and stable Respiratory status: spontaneous breathing, nonlabored ventilation, respiratory function stable and patient connected to nasal cannula oxygen Cardiovascular status: blood pressure returned to baseline and stable Postop Assessment: no apparent nausea or vomiting Anesthetic complications: no   No notable events documented.  Last Vitals:  Vitals:   08/02/23 0459 08/02/23 0744  BP: (P) 133/68 (!) 97/59  Pulse: (P) 87 85  Resp: (P) 18 18  Temp: (P) 36.7 C 36.8 C  SpO2: (P) 99% 95%    Last Pain:  Vitals:   08/02/23 0948  TempSrc:   PainSc: 5                  Abu Heavin L Ammi Hutt

## 2023-08-03 NOTE — Anesthesia Procedure Notes (Signed)
 Anesthesia Regional Block: Adductor canal block   Pre-Anesthetic Checklist: , timeout performed,  Correct Patient, Correct Site, Correct Laterality,  Correct Procedure, Correct Position, site marked,  Risks and benefits discussed,  Pre-op evaluation,  At surgeon's request and post-op pain management  Laterality: Right  Prep: Maximum Sterile Barrier Precautions used, chloraprep       Needles:  Injection technique: Single-shot  Needle Type: Echogenic Stimulator Needle     Needle Length: 9cm  Needle Gauge: 21     Additional Needles:   Procedures:,,,, ultrasound used (permanent image in chart),,    Narrative:  Start time: 08/01/2023 10:12 AM End time: 08/01/2023 10:15 AM Injection made incrementally with aspirations every 5 mL. Anesthesiologist: Niels Marien CROME, MD

## 2023-08-04 ENCOUNTER — Encounter (HOSPITAL_COMMUNITY): Payer: Self-pay | Admitting: Orthopedic Surgery

## 2023-08-05 ENCOUNTER — Encounter: Payer: Self-pay | Admitting: Physician Assistant

## 2023-08-05 ENCOUNTER — Encounter: Payer: Self-pay | Admitting: Oncology

## 2023-08-05 ENCOUNTER — Other Ambulatory Visit (HOSPITAL_COMMUNITY): Payer: Self-pay

## 2023-08-05 ENCOUNTER — Other Ambulatory Visit: Payer: Self-pay

## 2023-08-05 ENCOUNTER — Other Ambulatory Visit: Payer: Self-pay | Admitting: Physician Assistant

## 2023-08-05 MED ORDER — TRAMADOL HCL 50 MG PO TABS
50.0000 mg | ORAL_TABLET | Freq: Three times a day (TID) | ORAL | 0 refills | Status: AC | PRN
  Filled 2023-08-05: qty 30, 10d supply, fill #0

## 2023-08-05 MED ORDER — OMEPRAZOLE 40 MG PO CPDR
40.0000 mg | DELAYED_RELEASE_CAPSULE | Freq: Every evening | ORAL | 1 refills | Status: DC
  Filled 2023-08-05: qty 90, 90d supply, fill #0
  Filled 2023-10-28: qty 90, 90d supply, fill #1

## 2023-08-05 NOTE — Telephone Encounter (Signed)
 Pt update and FYI from patient

## 2023-08-06 ENCOUNTER — Other Ambulatory Visit: Payer: Self-pay

## 2023-08-06 NOTE — Patient Outreach (Signed)
 Complex Care Management   Visit Note  08/05/2023  Name:  Felicia [name] MRN: [id number] DOB: December 14, 1950  Situation: Referral received for Complex Care Management related to multiple falls over the last 3 months, most recent fall on 7/4 resulted in R ankle fracture requiring surgical repair of ankle on 7/18.  I obtained verbal consent from Patient.  Visit completed with patient  on the phone  Background:   Past Medical History:  Diagnosis Date   Adenomatous colon polyp 2015   Anemia    Anxiety    Breast cancer (HCC) 09/2019   left breast IMC   CAD (coronary artery disease)    a. 3/2007s/p DES to the LAD ([place] ); b. 01/2017 MV: EF 80%, small, mild apical ant defect w/o ischemia (felt to be breast atten). Low risk.   Depression    DM (diabetes mellitus) (HCC)    Emphysema lung (HCC)    Essential hypertension    GERD (gastroesophageal reflux disease)    Headache    secondary to neck surgery per patient   High triglycerides    HTN (hypertension)    Hyperlipidemia    Hypothyroid    OA (osteoarthritis)    right knee,hands   Overflow incontinence    Pneumonia 2003   PONV (postoperative nausea and vomiting)    s/p gallbladder    RLS (restless legs syndrome)    Urinary urgency    Uterine cancer (HCC) dx'd 2014    Assessment: Patient Reported Symptoms:  Cognitive Cognitive Status: Normal speech and language skills, Insightful and able to interpret abstract concepts, Alert and oriented to person, place, and time Cognitive/Intellectual Conditions Management [RPT]: None reported or documented in medical history or problem list   Health Maintenance Behaviors: Annual physical exam, Exercise, Healthy diet Healing Pattern: Unsure Health Facilitated by: Pain control, Healthy diet, Rest  Neurological Neurological Review of Symptoms: No symptoms reported    HEENT HEENT Symptoms Reported: No symptoms reported      Cardiovascular Cardiovascular Symptoms Reported: No symptoms  reported Does patient have uncontrolled Hypertension?: No Cardiovascular Management Strategies: Medication therapy Cardiovascular Self-Management Outcome: 4 (good)  Respiratory Respiratory Symptoms Reported: No symptoms reported    Endocrine Endocrine Symptoms Reported: No symptoms reported Is patient diabetic?: Yes Is patient checking blood sugars at home?: Yes Endocrine Self-Management Outcome: 4 (good)  Gastrointestinal Gastrointestinal Symptoms Reported: No symptoms reported      Genitourinary Genitourinary Symptoms Reported: No symptoms reported    Integumentary Integumentary Symptoms Reported: No symptoms reported    Musculoskeletal Musculoskelatal Symptoms Reviewed: Other, Limited mobility Other Musculoskeletal Symptoms: Presently Non-weightbearing to RLE after undergoing surgical repair of closed right ankle fracture on 7/18 until follow-up w/ surgeon 8/1. Complying with NWB limitation, states pain has been manageable, has meals on wheels and family & friends have also stocked her freezer & refrigerator with easy-to-warm-up meals, utilizing the seat on her rollator walker to sit and scoot herself about in order to comply with RLE NWB orders. Musculoskeletal Management Strategies: Medical device, Medication therapy, Adequate rest, Coping strategies, Exercise Musculoskeletal Self-Management Outcome: 3 (uncertain)      Psychosocial Psychosocial Symptoms Reported: No symptoms reported     Quality of Family Relationships: involved, supportive, helpful Do you feel physically threatened by others?: No      07/08/2023    1:44 PM  Depression screen PHQ 2/9  Decreased Interest 2  Down, Depressed, Hopeless 1  PHQ - 2 Score 3  Altered sleeping 2  Tired, decreased energy 2  Change in  appetite 2  Feeling bad or failure about yourself  2  Trouble concentrating 2  Moving slowly or fidgety/restless 1  Suicidal thoughts 0  PHQ-9 Score 14  Difficult doing work/chores Somewhat  difficult    There were no vitals filed for this visit.  Medications Reviewed Today     Reviewed by Gordy Channing LABOR, RN (Registered Nurse) on 08/05/23 at 1425  Med List Status: <None>   Medication Order Taking? Sig Documenting Provider Last Dose Status Informant  ACCU-CHEK GUIDE test strip [id number] Yes USE TO check blood glucose UP TO four times daily AS DIRECTED Allwardt, Alyssa M, PA-C  Active Self  Accu-Chek Softclix Lancets lancets [id number] Yes USE TO check blood glucose UP TO four times daily AS DIRECTED Allwardt, Alyssa M, PA-C  Active Self  acetaminophen  (TYLENOL ) 500 MG tablet [id number] Yes Take 500 mg by mouth every 6 (six) hours as needed for mild pain (pain score 1-3) or moderate pain (pain score 4-6). [provider]  Active Self  albuterol  (VENTOLIN  HFA) 108 (90 Base) MCG/ACT inhaler [id number] Yes Inhale 2 puffs into the lungs every 6 (six) hours as needed for wheezing or shortness of breath. Kassie Acquanetta Bradley, MD  Active Self  anastrozole  (ARIMIDEX ) 1 MG tablet [id number] Yes Take 1 tablet (1 mg total) by mouth daily.  Patient taking differently: Take 1 mg by mouth at bedtime.   Gudena, Vinay, MD  Active Self  ascorbic acid (VITAMIN C) 500 MG tablet [id number] Yes Take 500 mg by mouth daily. [provider]  Active Self  aspirin  EC (ASPIRIN  81) 81 MG tablet [id number] Yes Take 1 tablet (81 mg total) by mouth in the morning. Allwardt, Mardy HERO, PA-C  Active Self  azelastine  (ASTELIN ) 0.1 % nasal spray [id number] Yes Place 2 sprays into both nostrils 2 (two) times daily. Use in each nostril as directed Allwardt, Alyssa M, PA-C  Active Self  bisoprolol  (ZEBETA ) 5 MG tablet [id number] Yes Take 1 tablet (5 mg total) by mouth at bedtime. Nahser, Aleene PARAS, MD  Active Self  blood glucose meter kit and supplies [id number] Yes Dispense based on patient and insurance preference. Use up to four times daily as directed. (FOR ICD-10 E10.9, E11.9). Allwardt, Mardy HERO, PA-C   Active Self  buPROPion  (WELLBUTRIN  XL) 150 MG 24 hr tablet [id number] Yes Take 1 tablet (150 mg total) by mouth every morning for depression. Allwardt, Mardy HERO, PA-C  Active Self  celecoxib  (CELEBREX ) 200 MG capsule [id number] Yes Take 1 capsule (200 mg total) by mouth 2 (two) times daily. Allwardt, Alyssa M, PA-C  Active Self  CINNAMON PO [id number] Yes Take 600 mg by mouth daily with supper. [provider]  Active Self  clotrimazole -betamethasone  (LOTRISONE) cream [id number] Yes Apply 1 Application topically daily as needed (Rash). [provider]  Active Self  diclofenac  sodium (VOLTAREN ) 1 % GEL [id number] Yes Apply 2-4 g topically 4 (four) times daily as needed (as directed for pain). [provider]  Active Self  empagliflozin  (JARDIANCE ) 25 MG TABS tablet [id number] Yes Take 1 tablet (25 mg total) by mouth daily. Allwardt, Mardy HERO, PA-C  Active Self  escitalopram  (LEXAPRO ) 20 MG tablet [id number] Yes Take 1 tablet (20 mg total) by mouth daily. Allwardt, Mardy HERO, PA-C  Active Self  fenofibrate  (TRICOR ) 145 MG tablet [id number] Yes Take 1 tablet (145 mg total) by mouth at bedtime. Nahser, Aleene PARAS, MD  Active Self  Ferrous Sulfate (IRON PO) [id number] Yes Take 1 tablet by mouth daily.  alternates days:1 tablet one day and 2 tablets the next day [provider]  Active Self  FIBER PO [id number] Yes Take 1 tablet by mouth daily. [provider]  Active Self  gabapentin  (NEURONTIN ) 600 MG tablet [id number] Yes Take 1 tablet (600 mg total) by mouth 3 (three) times daily. Skeet Juliene SAUNDERS, DO  Active Self  glipiZIDE  (GLUCOTROL  XL) 10 MG 24 hr tablet [id number] Yes Take 2 tablets (20 mg total) by mouth every morning. Allwardt, Mardy HERO, PA-C  Active Self  glucose blood test strip [id number] Yes Use to check blood sugar up to four times daily as directed Allwardt, Alyssa M, PA-C  Active Self  HYDROcodone -acetaminophen  (NORCO/VICODIN) 5-325 MG tablet [id number] Yes Take 1  tablet by mouth every 6 (six) hours as needed. Gerome Maurilio HERO, PA-C  Active   Incontinence Supply Disposable (DEPEND ADJUSTABLE UNDERWEAR) MISC [id number] Yes 1 Application by Does not apply route daily. Allwardt, Mardy HERO, PA-C  Active Self  insulin  glargine (LANTUS  SOLOSTAR) 100 UNIT/ML Solostar Pen [id number] Yes Start with injecting 4 units under the skin in the evenings. Increase by 2 units every 2 days until monring fasting glucose is to goal (140). Max units 60 per day.  Patient taking differently: Inject 50 Units into the skin at bedtime.  Max units 60 per day.   Allwardt, Mardy HERO, PA-C  Active Self  Insulin  Pen Needle (INSUPEN PEN NEEDLES) 32G X 4 MM MISC [id number] Yes Use to inject insulin  as directed Allwardt, Mardy HERO, PA-C  Active Self  ketoconazole  (NIZORAL ) 2 % cream [id number] Yes Apply 1 Application topically daily.  Patient taking differently: Apply 1 Application topically daily as needed (Rash).   Allwardt, Mardy HERO, PA-C  Active Self  Lancets MISC [id number] Yes Use to check blood sugar up to four times daily as directed Allwardt, Mardy HERO, PA-C  Active Self  levothyroxine  (SYNTHROID ) 125 MCG tablet [id number] Yes Take 1 tablet (125 mcg total) by mouth daily before breakfast. Allwardt, Mardy HERO, PA-C  Active Self  loratadine  (ALLERGY RELIEF) 10 MG tablet [id number] Yes Take 1 tablet (10 mg total) by mouth every evening. Allwardt, Mardy HERO, PA-C  Active Self  magnesium  gluconate (MAGONATE) 500 (27 Mg) MG TABS tablet [id number] Yes Take 500 mg by mouth at bedtime. [provider]  Active Self  Multiple Vitamins-Calcium  (ONE-A-DAY WOMENS PO) [id number] Yes Take 1 tablet by mouth daily. [provider]  Active Self  mupirocin  cream (BACTROBAN ) 2 % [id number] Yes Apply 1 Application topically 3 (three) times daily.  Patient taking differently: Apply 1 Application topically daily as needed (Rash).   Allwardt, Mardy HERO, PA-C  Active Self  nitroGLYCERIN  (NITROSTAT ) 0.4 MG SL  tablet [id number] Yes Dissolve 1 tablet under the tongue every 5 minutes as needed for chest pain. Max of 3 doses, then 911. Nahser, Aleene PARAS, MD  Active Self  omeprazole  (PRILOSEC) 40 MG capsule [id number] Yes Take 1 capsule (40 mg total) by mouth every evening. Allwardt, Mardy HERO, PA-C  Active   polyethylene glycol powder (MIRALAX ) 17 GM/SCOOP powder [id number] Yes Take 17 g by mouth daily.  Patient taking differently: Take 17 g by mouth daily as needed for mild constipation or moderate constipation.   Craig Alan SAUNDERS, [place]  Active Self  Potassium 99 MG TABS [id number] Yes Take 99 mg by mouth at bedtime. [provider]  Active Self  rOPINIRole  (REQUIP ) 1 MG tablet [id number] Yes Take 1 tablet (1 mg total) by mouth at bedtime. Corey, Evan  S, MD  Active Self  rosuvastatin  (CRESTOR ) 40 MG tablet [id number] Yes Take 1 tablet (40 mg total) by mouth daily. Mona Vinie BROCKS, MD  Active Self  Semaglutide ,0.25 or 0.5MG /DOS, (OZEMPIC , 0.25 OR 0.5 MG/DOSE,) 2 MG/3ML SOPN [id number] Yes Inject 0.25 mg into the skin once a week for 28 days, THEN 0.5 mg once a week for 28 days.  Patient taking differently: Inject 0.25 mg into the skin once a week for 28 days, THEN 0.5 mg once a week for 28 days.   Allwardt, Mardy HERO, PA-C  Active Self  Tiotropium Bromide-Olodaterol (STIOLTO RESPIMAT ) 2.5-2.5 MCG/ACT AERS [id number] Yes Inhale 2 puffs into the lungs daily. Kassie Acquanetta Bradley, MD  Active Self  tiZANidine  (ZANAFLEX ) 2 MG tablet [id number] Yes Take 1 tablet (2 mg total) by mouth 3 (three) times daily. Skeet Juliene SAUNDERS, DO  Active Self  triamcinolone  (KENALOG ) 0.1 % paste [id number] Yes Apply a small amount to affected area twice daily for 2 - 3 weeks.  Patient taking differently: Use as directed 1 Application in the mouth or throat daily as needed (Mouth).     Active Self  TURMERIC PO [id number] Yes Take 450 mg by mouth daily with supper. [provider]  Active Self  Med List Note Christie Alyson Sola  07/31/23 [id number]): Jolynn cone            Recommendation:   Specialty provider follow-up 08/15/23 with Orthopedic surgeon Continue complying with Non-weightbearing status to right leg until sees Surgeon post op  Follow Up Plan:   Telephone follow up appointment date/time:  08/18/23 at 1pm.  Maridel Pixler A. Gordy RN, BA, [facility], CRRN [place]  [facility] Population Health RN Care Manager Direct Dial: [phone]  Fax: [phone]

## 2023-08-06 NOTE — Patient Instructions (Signed)
 Visit Information  Thank you for taking time to visit with me today. Please don't hesitate to contact me if I can be of assistance to you before our next scheduled telephone appointment.  Our next appointment is by telephone on 8/4 at 1pm.  Following is a copy of your care plan:   Goals Addressed             This Visit's Progress    VBCI RN Care Plan       Problems:  Chronic Disease Management support and education needs related to 3 falls over 2 months and recent injury to Right ankle due to most recent fall going up staircase @ home.  Goal: Over the next 4 months the Patient will continue to work with RN Care Manager and/or Social Worker to address care management and care coordination needs related to 3 falls over last 2 months and sustaining an injury to right ankle as result of falling while walking up stairs as evidenced by adherence to care management team scheduled appointments      Interventions:   Falls Interventions: Provided written and verbal education re: potential causes of falls and Fall prevention strategies Reviewed medications and discussed potential side effects of medications such as dizziness and frequent urination Advised patient of importance of notifying provider of falls Assessed for signs and symptoms of orthostatic hypotension Assessed for falls since last encounter Assessed patients knowledge of fall risk prevention secondary to previously provided education Provided patient information for fall alert systems Screening for signs and symptoms of depression related to chronic disease state  Assessed social determinant of health barriers  Patient Self-Care Activities:  Attend all scheduled provider appointments Call pharmacy for medication refills 3-7 days in advance of running out of medications Call provider office for new concerns or questions  Perform all self care activities independently  Perform IADL's (shopping, preparing meals, housekeeping,  managing finances) independently Take medications as prescribed   Work with the social worker to address care coordination needs and will continue to work with the clinical team to address health care and disease management related needs Underwent surgery on 7/18 for closed fracture of right ankle that was injured while she was walking upstairs. Currently non-weightbearing to RLE until post-op follow up on 8/1.   Plan:  The patient has been provided with contact information for the care management team and has been advised to call with any health related questions or concerns.  Next appointment with RNCM scheduled for 08/18/23 @ 1pm.             Patient verbalizes understanding of instructions and care plan provided today and agrees to view in MyChart. Active MyChart status and patient understanding of how to access instructions and care plan via MyChart confirmed with patient.     The patient has been provided with contact information for the care management team and has been advised to call with any health related questions or concerns.   Please call the care guide team at [phone] if you need to cancel or reschedule your appointment.   Please call [phone]-TALK (toll free, 24 hour hotline) if you are experiencing a Mental Health or Behavioral Health Crisis or need someone to talk to.  Imanni Burdine A. Gordy RN, BA, [facility], CRRN [place]  [facility] Population Health RN Care Manager Direct Dial: [phone]  Fax: [phone]

## 2023-08-11 NOTE — Telephone Encounter (Signed)
 Will wait to see if patients wants it

## 2023-08-12 ENCOUNTER — Other Ambulatory Visit: Payer: Self-pay | Admitting: Physician Assistant

## 2023-08-12 ENCOUNTER — Other Ambulatory Visit (HOSPITAL_COMMUNITY): Payer: Self-pay

## 2023-08-12 MED ORDER — CELECOXIB 200 MG PO CAPS
200.0000 mg | ORAL_CAPSULE | Freq: Two times a day (BID) | ORAL | 1 refills | Status: DC
  Filled 2023-08-12: qty 180, 90d supply, fill #0
  Filled 2023-11-04: qty 180, 90d supply, fill #1

## 2023-08-15 ENCOUNTER — Encounter: Payer: Self-pay | Admitting: Pharmacist

## 2023-08-15 ENCOUNTER — Ambulatory Visit (INDEPENDENT_AMBULATORY_CARE_PROVIDER_SITE_OTHER): Payer: Self-pay

## 2023-08-15 ENCOUNTER — Ambulatory Visit: Admitting: Family

## 2023-08-15 ENCOUNTER — Other Ambulatory Visit: Payer: Self-pay | Admitting: Pharmacist

## 2023-08-15 DIAGNOSIS — S99911A Unspecified injury of right ankle, initial encounter: Secondary | ICD-10-CM

## 2023-08-15 NOTE — Progress Notes (Signed)
 Post-Op Visit Note   Patient: Felicia [name]           Date of Birth: [birth year]           MRN: [id number] Visit Date: 08/15/2023 PCP: Kathrene Mardy HERO, PA-C  Chief Complaint:  Chief Complaint  Patient presents with   Right Ankle - Routine Post Op    08/01/2023 ORIF right ankle fracture    HPI:  HPI The patient is a 73 year old woman who presents status post ORIF right ankle fracture July 18.  She has been using her rolling walker as a to nonweightbearing.  She has some pain right now but overall has been doing quite well she has not had issues controlling her pain.  She does remove the cam boot for sleep and rest she reports using ice as well as tramadol  for pain Ortho Exam On examination right ankle her incisions well-approximated sutures there is no gaping or drainage no erythema minimal edema  Visit Diagnoses:  1. Ankle injuries, right, initial encounter     Plan: Begin daily Dial soap cleansing.  Dry dressings.  Continue the cam boot.  She may again begin to weight-bear as tolerated in the cam boot follow-up in 2 weeks with repeat radiographs  Follow-Up Instructions: No follow-ups on file.   Imaging: No results found.  Orders:  Orders Placed This Encounter  Procedures   XR Ankle Complete Right   No orders of the defined types were placed in this encounter.    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   Erosive osteoarthritis of multiple sites 09/26/2021   Frequent headaches 05/31/2021   Bilateral impacted cerumen 02/22/2021   Nasal septal perforation 02/22/2021   Postnasal drip 02/22/2021   Hoarseness 01/25/2021   Hyperglycemia due to type 2  diabetes mellitus (HCC) 01/18/2021   Mixed simple and mucopurulent chronic bronchitis (HCC) 12/27/2020   Centrilobular emphysema (HCC) 07/31/2020   Shortness of breath 07/31/2020   Tongue lesion 04/30/2020   Hypothyroid    Anxiety    Anxiety and depression    Port-A-Cath in place 11/24/2019   Hepatic steatosis 09/28/2019   Malignant neoplasm of upper-outer quadrant of left breast in female, estrogen receptor positive (HCC) 09/02/2019   Trigger index finger of right hand 05/05/2019   Primary osteoarthritis of first carpometacarpal joint of right hand 05/05/2019   Osteoarthritis of finger of right hand 05/05/2019   Endometrial cancer (HCC) 10/23/2018   Cirrhosis of liver without ascites (HCC) 10/23/2018   Diarrhea 10/23/2018   Chest pain 02/15/2018   Coronary artery disease involving native coronary artery of native heart without angina pectoris 11/18/2017   Hyperlipidemia LDL goal <70 11/18/2017   Diabetes mellitus treated with insulin  and oral medication (HCC) 11/18/2017   Dizziness 11/18/2017   Type 2 diabetes mellitus without complication, without long-term current use of insulin  (HCC) 11/18/2017   Past Medical History:  Diagnosis Date   Adenomatous colon polyp 2015   Anemia    Anxiety    Breast cancer (HCC) 09/2019   left breast IMC   CAD (coronary artery disease)    a. 3/2007s/p DES to the LAD ([place] ); b. 01/2017 MV: EF 80%, small, mild apical  ant defect w/o ischemia (felt to be breast atten). Low risk.   Depression    DM (diabetes mellitus) (HCC)    Emphysema lung (HCC)    Essential hypertension    GERD (gastroesophageal reflux disease)    Headache    secondary to neck surgery per patient   High triglycerides    HTN (hypertension)    Hyperlipidemia    Hypothyroid    OA (osteoarthritis)    right knee,hands   Overflow incontinence    Pneumonia 2003   PONV (postoperative nausea and vomiting)    s/p gallbladder    RLS (restless legs syndrome)    Urinary  urgency    Uterine cancer (HCC) dx'd 2014    Family History  Problem Relation Age of Onset   AAA (abdominal aortic aneurysm) Mother    Thyroid  disease Mother    Colon cancer Mother    Diabetes Father    Alzheimer's disease Father    Arthritis Sister    Heart disease Sister    Throat cancer Sister    Kidney failure Sister    Diabetes Sister    Diabetes Brother    Diabetes Brother    Breast cancer Cousin     Past Surgical History:  Procedure Laterality Date   BREAST LUMPECTOMY WITH RADIOACTIVE SEED AND SENTINEL LYMPH NODE BIOPSY Left 10/07/2019   Procedure: LEFT BREAST LUMPECTOMY WITH RADIOACTIVE SEED AND SENTINEL LYMPH NODE BIOPSY;  Surgeon: Ebbie Cough, MD;  Location: [facility];  Service: General;  Laterality: Left;   CHOLECYSTECTOMY     COLONOSCOPY     CORONARY STENT PLACEMENT     CORONARY/GRAFT ACUTE MI REVASCULARIZATION     FINGER ARTHRODESIS Right 06/11/2019   Procedure: ARTHRODESIS INDEX FINGER DISTAL PHALANGEAL JOINT;  Surgeon: Murrell Drivers, MD;  Location: [facility];  Service: Orthopedics;  Laterality: Right;   LEFT HEART CATH AND CORONARY ANGIOGRAPHY N/A 02/16/2018   Procedure: LEFT HEART CATH AND CORONARY ANGIOGRAPHY;  Surgeon: Court Dorn PARAS, MD;  Location: [facility];  Service: Cardiovascular;  Laterality: N/A;   LEFT HEART CATH AND CORONARY ANGIOGRAPHY N/A 05/01/2020   Procedure: LEFT HEART CATH AND CORONARY ANGIOGRAPHY;  Surgeon: Court Dorn PARAS, MD;  Location: [facility];  Service: Cardiovascular;  Laterality: N/A;   ORIF ANKLE FRACTURE Right 08/01/2023   Procedure: OPEN REDUCTION INTERNAL FIXATION (ORIF) ANKLE FRACTURE, RIGHT;  Surgeon: Harden Jerona GAILS, MD;  Location: [facility];  Service: Orthopedics;  Laterality: Right;  RIGHT ORIF DISTAL FIBULA FRACTURE   PORTACATH PLACEMENT Right 11/23/2019   Procedure: INSERTION PORT-A-CATH WITH ULTRASOUND GUIDANCE;  Surgeon: Ebbie Cough, MD;  Location:   SURGERY CENTER;  Service: General;  Laterality: Right;   POSTERIOR FUSION CERVICAL SPINE     C3-C7   REPLACEMENT TOTAL KNEE Right    SPINE SURGERY     TOTAL ABDOMINAL HYSTERECTOMY     FOR UTERUS CANCER   TOTAL HIP ARTHROPLASTY Left 2015   Social History   Occupational History   Occupation: retired  Tobacco Use   Smoking status: Never   Smokeless tobacco: Never  Vaping Use   Vaping status: Never Used  Substance and Sexual Activity   Alcohol use: Not Currently    Comment: OCCASIONALLY   Drug use: No   Sexual activity: Not Currently    Birth control/protection: Surgical    Comment: hyst

## 2023-08-15 NOTE — Progress Notes (Signed)
 08/15/23 Name: Felicia [name] MRN: [id number] DOB: 25-Aug-1950  Chief Complaint  Patient presents with   Medication Management   Diabetes   Hyperlipidemia    Felicia [name] is a 73 y.o. year old female who presented for a telephone visit.   They were referred to the pharmacist by their PCP for assistance in managing diabetes, complex medication management, and frequent falls.   Subjective:  Care Team: Primary Care Provider: Allwardt, Mardy HERO, PA-C ; Next Scheduled Visit: 09/04/2023 Endocrinologist Dr Mercie; Next Scheduled Visit: 10/14/2023 - initial visit Neurologist: Skeet; Next Scheduled Visit: 09/29/2023  Medication Access/Adherence  Current [name]:  Felicia [name] - [name] [address] [place] [address] Phone: [phone] Fax: [phone]  Felicia [name] [address] [place] [address] Phone: [phone] Fax: [phone]   Patient reports affordability concerns with their medications: No  Patient reports access/transportation concerns to their [name]: No  Patient reports adherence concerns with their medications:  No      Diabetes:  Current medications:  Jardiance  25mg  daily, glipizide  10mg  - take 2 tablets every morning, Lantus  50 units once a day.  Restarted Ozempic  0.25mg  weekly about 2 weeks ago - will start 0.5mg  weekly on Sunday 8/3 (held Ozempic  when she had surgery in July)  Medications tried in the past: Trulicity  - stopped because she had trouble getting it; Mounjaro  - chart states she stopped due to diarrhea and night sweats but patient did not remember taking Mounjaro ; metformin  - stopped because suspected was worsening diarrhea (fecal incontinence)   Current glucose readings: 134 to 220's  Using Accu-Chek meter; testing 1 to 2 times daily She tried Continuous Glucose Monitor in past but didn't like it - sensor did not stay on and too many alerts. States the sensors just don't work  with me   Patient reports hypoglycemic s/sx including dizziness, shakiness, sweating once in the last 2 weeks. - States that she felt hypoglycemia symptoms but blood glucose was 180.  Patient denies hyperglycemic symptoms including no polyuria, polydipsia, polyphagia, nocturia, neuropathy, blurred vision.  Receiving Meals on Wheels at home for next few months. She generally likes what they send. She doesn't like collards but will eats a little. She will sometimes add veggies like Brussels sprouts.  Snacks - oatmeal cream cookies, 3 double stuff oreos.  Uses coconut sugar in her tea.   Current medication access support: none  Hypertension:  Current medications: bisoprolol  5mg  once at bedtime  Patient denies hypotensive s/sx including no dizziness, lightheadedness.  Patient denies hypertensive symptoms including no headache, chest pain, shortness of breath  Hyperlipidemia:  Last LDL was 199 She restarted rosuvastatin  10mg  daily 07/23/2023.  Patient report she is tolerating rosuvastatin  without any side effects.   Frequent Falls - Patient fell on 07/18/2023 going up steps at her daughters home. She denies dizziness. Just felt that her leg were weak. She fractured ankle and had surgery 08/01/2023. She will restart physical therapy soon.   Current medications associated with increase in falls - bisoprolol , escitalopram , gabapentin  (taking for headaches), glipizide , hydrocodone  (takes only as needed - a few times per month) , insulin  (hypoglycemia), tizanidine , tramadol    Objective:  BP Readings from Last 3 Encounters:  08/02/23 (!) 97/59  07/08/23 (!) 104/58  06/20/23 128/68     Lab Results  Component Value Date   HGBA1C 10.0 (A) 06/20/2023    Lab Results  Component Value Date   CREATININE 1.02 (H) 08/01/2023   BUN 21  08/01/2023   NA 135 08/01/2023   K 4.3 08/01/2023   CL 101 08/01/2023   CO2 21 (L) 08/01/2023    Lab Results  Component Value Date   CHOL 281 (H)  11/14/2022   HDL 46 11/14/2022   LDLCALC 161 (H) 11/14/2022   TRIG 389 (H) 11/14/2022   CHOLHDL 6.1 (H) 11/14/2022    Medications Reviewed Today     Reviewed by Carla Milling, RPH-CPP (Pharmacist) on 08/15/23 at 1524  Med List Status: <None>   Medication Order Taking? Sig Documenting Provider Last Dose Status Informant  ACCU-CHEK GUIDE test strip [id number]  USE TO check blood glucose UP TO four times daily AS DIRECTED Allwardt, Alyssa M, PA-C  Active Self  Accu-Chek Softclix Lancets lancets [id number]  USE TO check blood glucose UP TO four times daily AS DIRECTED Allwardt, Alyssa M, PA-C  Active Self  acetaminophen  (TYLENOL ) 500 MG tablet [id number]  Take 500 mg by mouth every 6 (six) hours as needed for mild pain (pain score 1-3) or moderate pain (pain score 4-6). [provider]  Active Self  albuterol  (VENTOLIN  HFA) 108 (90 Base) MCG/ACT inhaler [id number]  Inhale 2 puffs into the lungs every 6 (six) hours as needed for wheezing or shortness of breath. Kassie Acquanetta Bradley, MD  Active Self  anastrozole  (ARIMIDEX ) 1 MG tablet [id number] Yes Take 1 tablet (1 mg total) by mouth daily. Gudena, Vinay, MD  Active Self  ascorbic acid (VITAMIN C) 500 MG tablet [id number] Yes Take 500 mg by mouth daily. [provider]  Active Self  aspirin  EC (ASPIRIN  81) 81 MG tablet [id number] Yes Take 1 tablet (81 mg total) by mouth in the morning. Allwardt, Mardy HERO, PA-C  Active Self  azelastine  (ASTELIN ) 0.1 % nasal spray [id number] Yes Place 2 sprays into both nostrils 2 (two) times daily. Use in each nostril as directed Allwardt, Alyssa M, PA-C  Active Self  bisoprolol  (ZEBETA ) 5 MG tablet [id number] Yes Take 1 tablet (5 mg total) by mouth at bedtime. Nahser, Aleene PARAS, MD  Active Self  blood glucose meter kit and supplies [id number] Yes Dispense based on patient and insurance preference. Use up to four times daily as directed. (FOR ICD-10 E10.9, E11.9). Allwardt, Mardy HERO, PA-C  Active Self  buPROPion   (WELLBUTRIN  XL) 150 MG 24 hr tablet [id number] Yes Take 1 tablet (150 mg total) by mouth every morning for depression. Allwardt, Mardy HERO, PA-C  Active Self  celecoxib  (CELEBREX ) 200 MG capsule [id number] Yes Take 1 capsule (200 mg total) by mouth 2 (two) times daily. Allwardt, Alyssa M, PA-C  Active   CINNAMON PO [id number] Yes Take 600 mg by mouth daily with supper. [provider]  Active Self  clotrimazole -betamethasone  (LOTRISONE) cream [id number]  Apply 1 Application topically daily as needed (Rash). [provider]  Active Self  diclofenac  sodium (VOLTAREN ) 1 % GEL [id number] Yes Apply 2-4 g topically 4 (four) times daily as needed (as directed for pain). [provider]  Active Self  empagliflozin  (JARDIANCE ) 25 MG TABS tablet [id number] Yes Take 1 tablet (25 mg total) by mouth daily. Allwardt, Mardy HERO, PA-C  Active Self  escitalopram  (LEXAPRO ) 20 MG tablet [id number] Yes Take 1 tablet (20 mg total) by mouth daily. Allwardt, Mardy HERO, PA-C  Active Self  fenofibrate  (TRICOR ) 145 MG tablet [id number] Yes Take 1 tablet (145 mg total) by mouth at bedtime. Nahser, Aleene PARAS, MD  Active Self  Ferrous Sulfate (IRON PO) [id number] Yes Take 1 tablet by mouth  daily. alternates days:1 tablet one day and 2 tablets the next day [provider]  Active Self  FIBER PO [id number] Yes Take 1 tablet by mouth daily. [provider]  Active Self  gabapentin  (NEURONTIN ) 600 MG tablet [id number] Yes Take 1 tablet (600 mg total) by mouth 3 (three) times daily. Skeet Juliene SAUNDERS, DO  Active Self  glipiZIDE  (GLUCOTROL  XL) 10 MG 24 hr tablet [id number] Yes Take 2 tablets (20 mg total) by mouth every morning. Allwardt, Mardy HERO, PA-C  Active Self  glucose blood test strip [id number]  Use to check blood sugar up to four times daily as directed Allwardt, Alyssa M, PA-C  Active Self  HYDROcodone -acetaminophen  (NORCO/VICODIN) 5-325 MG tablet [id number] Yes Take 1 tablet by mouth every 6 (six) hours  as needed. Gerome Maurilio HERO, PA-C  Active   Incontinence Supply Disposable (DEPEND ADJUSTABLE UNDERWEAR) MISC [id number]  1 Application by Does not apply route daily. Allwardt, Mardy HERO, PA-C  Active Self  insulin  glargine (LANTUS  SOLOSTAR) 100 UNIT/ML Solostar Pen [id number] Yes Start with injecting 4 units under the skin in the evenings. Increase by 2 units every 2 days until monring fasting glucose is to goal (140). Max units 60 per day. Allwardt, Mardy HERO, PA-C  Active Self  Insulin  Pen Needle (INSUPEN PEN NEEDLES) 32G X 4 MM MISC [id number]  Use to inject insulin  as directed Allwardt, Alyssa M, PA-C  Active Self  ketoconazole  (NIZORAL ) 2 % cream [id number] Yes Apply 1 Application topically daily. Allwardt, Mardy HERO, PA-C  Active Self  Lancets MISC [id number]  Use to check blood sugar up to four times daily as directed Allwardt, Mardy HERO, PA-C  Active Self  levothyroxine  (SYNTHROID ) 125 MCG tablet [id number] Yes Take 1 tablet (125 mcg total) by mouth daily before breakfast. Allwardt, Mardy HERO, PA-C  Active Self  loratadine  (ALLERGY RELIEF) 10 MG tablet [id number] Yes Take 1 tablet (10 mg total) by mouth every evening. Allwardt, Mardy HERO, PA-C  Active Self  magnesium  gluconate (MAGONATE) 500 (27 Mg) MG TABS tablet [id number] Yes Take 500 mg by mouth at bedtime. [provider]  Active Self  Multiple Vitamins-Calcium  (ONE-A-DAY WOMENS PO) [id number] Yes Take 1 tablet by mouth daily. [provider]  Active Self  mupirocin  cream (BACTROBAN ) 2 % [id number]  Apply 1 Application topically 3 (three) times daily.  Patient taking differently: Apply 1 Application topically daily as needed (Rash).   Allwardt, Mardy HERO, PA-C  Active Self  nitroGLYCERIN  (NITROSTAT ) 0.4 MG SL tablet [id number]  Dissolve 1 tablet under the tongue every 5 minutes as needed for chest pain. Max of 3 doses, then 911. Nahser, Aleene PARAS, MD  Active Self  omeprazole  (PRILOSEC) 40 MG capsule [id number] Yes Take 1 capsule (40 mg  total) by mouth every evening. Allwardt, Mardy HERO, PA-C  Active   polyethylene glycol powder (MIRALAX ) 17 GM/SCOOP powder [id number]  Take 17 g by mouth daily.  Patient taking differently: Take 17 g by mouth daily as needed for mild constipation or moderate constipation.   Craig Alan SAUNDERS, [place]  Active Self  Potassium 99 MG TABS [id number] Yes Take 99 mg by mouth at bedtime. [provider]  Active Self  rOPINIRole  (REQUIP ) 1 MG tablet [id number] Yes Take 1 tablet (1 mg total) by mouth at bedtime. Corey, Evan S, MD  Active Self  rosuvastatin  (CRESTOR ) 40 MG tablet [id number] Yes Take 1 tablet (40 mg total) by mouth daily. Mona Vinie BROCKS, MD  Active Self  Semaglutide ,0.25 or 0.5MG /DOS, (  OZEMPIC , 0.25 OR 0.5 MG/DOSE,) 2 MG/3ML SOPN [id number] Yes Inject 0.25 mg into the skin once a week for 28 days, THEN 0.5 mg once a week for 28 days. Allwardt, Mardy HERO, PA-C  Active Self  Tiotropium Bromide-Olodaterol (STIOLTO RESPIMAT ) 2.5-2.5 MCG/ACT AERS [id number] Yes Inhale 2 puffs into the lungs daily. Kassie Acquanetta Bradley, MD  Active Self  tiZANidine  (ZANAFLEX ) 2 MG tablet [id number] Yes Take 1 tablet (2 mg total) by mouth 3 (three) times daily. Skeet Juliene SAUNDERS, DO  Active Self  traMADol  (ULTRAM ) 50 MG tablet [id number] Yes Take 1 tablet (50 mg total) by mouth every 8 (eight) hours as needed for up to 10 days for moderate pain (pain score 4-6) or severe pain (pain score 7-10). Allwardt, Alyssa M, PA-C  Active   triamcinolone  (KENALOG ) 0.1 % paste [id number]  Apply a small amount to affected area twice daily for 2 - 3 weeks.  Patient taking differently: Use as directed 1 Application in the mouth or throat daily as needed (Mouth).     Active Self  TURMERIC PO [id number] Yes Take 450 mg by mouth daily with supper. [provider]  Active Self  Med List Note Christie Alyson Sola 07/31/23 9041): [place]              Assessment/Plan:   Diabetes: Currently uncontrolled - but improving with  Lantus  titration - Reviewed goal A1c, goal fasting, and goal 2 hour post prandial glucose - Recommend to continue Jardiance , glipizide  and Lantus  - Continue as planned to increase Ozempic  to 0.5mg  weekly with next dose. - Recommend to check glucose 1 to 2 times a day a varying times of day.  - Discussed lower sugar / carbohydrate snack options - sent info by my chart.   Hypertension:Currently controlled - Reviewed [name] term cardiovascular and renal outcomes of uncontrolled blood pressure - Recommended to check home blood pressure and heart rate when she feels lightheaded / dizzy - Recommend to continue bisoprolol  (could consider ACEI or ARB in the future)    Hyperlipidemia  -continue rosuvastatin  20mg  daily - due to recheck lipids in August 2025.   Medication Management: - Regarding frequent falls - should minimize medications that can increase fall risk as much as possible. Patient is using pain medications and muscles relaxer sparingly. Continue as planned to restart physical therapy.   Follow Up Plan: 4 weeks  Madelin Ray, PharmD Clinical Pharmacist [facility]  Population Health [phone]

## 2023-08-18 ENCOUNTER — Other Ambulatory Visit: Payer: Self-pay

## 2023-08-18 ENCOUNTER — Other Ambulatory Visit: Payer: Self-pay | Admitting: Family Medicine

## 2023-08-18 ENCOUNTER — Other Ambulatory Visit (HOSPITAL_COMMUNITY): Payer: Self-pay

## 2023-08-18 DIAGNOSIS — Z5189 Encounter for other specified aftercare: Secondary | ICD-10-CM | POA: Diagnosis not present

## 2023-08-18 MED ORDER — ROPINIROLE HCL 1 MG PO TABS
1.0000 mg | ORAL_TABLET | Freq: Every day | ORAL | 0 refills | Status: DC
  Filled 2023-08-18: qty 90, 90d supply, fill #0

## 2023-08-18 NOTE — Patient Outreach (Signed)
 Complex Care Management   Visit Note  08/18/2023  Name:  Felicia [name] MRN: [id number] DOB: Oct 04, 1950  Situation: Referral received for Complex Care Management related to Diabetes with Complications and High Risk for Falls/Underwent recent ORIF to Right ankle d/t fracturing her Right ankle walking up the stairs in her home. I obtained verbal consent from Patient.  Visit completed with patient  on the phone  Background:   Past Medical History:  Diagnosis Date   Adenomatous colon polyp 2015   Anemia    Anxiety    Breast cancer (HCC) 09/2019   left breast IMC   CAD (coronary artery disease)    a. 3/2007s/p DES to the LAD ([place] ); b. 01/2017 MV: EF 80%, small, mild apical ant defect w/o ischemia (felt to be breast atten). Low risk.   Depression    DM (diabetes mellitus) (HCC)    Emphysema lung (HCC)    Essential hypertension    GERD (gastroesophageal reflux disease)    Headache    secondary to neck surgery per patient   High triglycerides    HTN (hypertension)    Hyperlipidemia    Hypothyroid    OA (osteoarthritis)    right knee,hands   Overflow incontinence    Pneumonia 2003   PONV (postoperative nausea and vomiting)    s/p gallbladder    RLS (restless legs syndrome)    Urinary urgency    Uterine cancer (HCC) dx'd 2014    Assessment: Patient Reported Symptoms:  Cognitive Cognitive Status: No symptoms reported, Normal speech and language skills, Insightful and able to interpret abstract concepts Cognitive/Intellectual Conditions Management [RPT]: None reported or documented in medical history or problem list   Health Maintenance Behaviors: Annual physical exam, Exercise, Healthy diet Healing Pattern: Unsure Health Facilitated by: Healthy diet, Pain control  Neurological Neurological Review of Symptoms: No symptoms reported Neurological Self-Management Outcome: 4 (good)  HEENT HEENT Symptoms Reported: No symptoms reported HEENT Management Strategies: Routine  screening HEENT Self-Management Outcome: 4 (good)    Cardiovascular Cardiovascular Symptoms Reported: No symptoms reported Does patient have uncontrolled Hypertension?: No Cardiovascular Management Strategies: Medication therapy Cardiovascular Self-Management Outcome: 4 (good)  Respiratory Respiratory Symptoms Reported: No symptoms reported    Endocrine Endocrine Symptoms Reported: No symptoms reported Is patient diabetic?: Yes Is patient checking blood sugars at home?: Yes Endocrine Self-Management Outcome: 4 (good) Endocrine Comment: had follow-up telephone appt w/ Clinical Pharmacist Tammy Eckard on 8/1 regarding diabetic meds including newly added Ozempic   Gastrointestinal Gastrointestinal Symptoms Reported: No symptoms reported      Genitourinary Genitourinary Symptoms Reported: No symptoms reported    Integumentary Integumentary Symptoms Reported: Other Other Integumentary Symptoms: States incision on her R ankle that patient underwent an ORIF R ankle on 7/18 d/t sustaining a fracture to her right ankle has continued to drain bright red blood from the portion of the incision that is poxial to the lateral malleolus. Patient states she has to change dressing to her R ankle every morning d/t continued bleeding. States dressing is not saturated but does exhibit fresh red serous drainagne each morning. No signs or symptoms of infection reported. Encouraged patient to have ankle looked at today - patient states she plans to go to an Urgent Care. Skin Management Strategies: Dressing changes Skin Self-Management Outcome: 3 (uncertain)  Musculoskeletal Other Musculoskeletal Symptoms: Patient followed up with her orthopedic surgeon last week and was told she could start weight bearing on her right leg s/p undegoing ORIF of Right ankle on 08/01/23 d/t rignt ankle  fracture sustained in fall going up stairs. Since appt. patient has been noting serosanguinous draining from her right ankle incision  every morning when she gets up and changes her dressing every day d/t the continued bleeding. Additional Musculoskeletal Details: States incision on her R ankle that patient underwent an ORIF R ankle on 7/18 d/t sustaining a fracture to her right ankle has continued to drain bright red blood from the portion of the incision that is poxial to the lateral malleolus. Patient states she has to change dressing to her R ankle every morning d/t continued bleeding. States dressing is not saturated but does exhibit fresh red serous drainagne each morning. No signs or symptoms of infection reported. Encouraged patient to have ankle looked at today - patient states she plans to go to an Urgent Care. Musculoskeletal Management Strategies: Medical device, Medication therapy, Adequate rest, Coping strategies, Exercise Musculoskeletal Self-Management Outcome: 3 (uncertain) Musculoskeletal Comment: Patient to follow up in person to an Ambulatory Urgent Care to have right ankle incision examined for possible incision dehiscence. No signs or symptoms of infection. Falls in the past year?: Yes Number of falls in past year: 2 or more Was there an injury with Fall?: Yes Fall Risk Category Calculator: 3 Patient Fall Risk Level: High Fall Risk    Psychosocial Psychosocial Symptoms Reported: No symptoms reported            07/08/2023    1:44 PM  Depression screen PHQ 2/9  Decreased Interest 2  Down, Depressed, Hopeless 1  PHQ - 2 Score 3  Altered sleeping 2  Tired, decreased energy 2  Change in appetite 2  Feeling bad or failure about yourself  2  Trouble concentrating 2  Moving slowly or fidgety/restless 1  Suicidal thoughts 0  PHQ-9 Score 14  Difficult doing work/chores Somewhat difficult    There were no vitals filed for this visit.  Medications Reviewed Today     Reviewed by Gordy Channing LABOR, RN (Registered Nurse) on 08/18/23 at 1337  Med List Status: <None>   Medication Order Taking? Sig  Documenting Provider Last Dose Status Informant  ACCU-CHEK GUIDE test strip [id number] Yes USE TO check blood glucose UP TO four times daily AS DIRECTED Allwardt, Alyssa M, PA-C  Active Self  Accu-Chek Softclix Lancets lancets [id number] Yes USE TO check blood glucose UP TO four times daily AS DIRECTED Allwardt, Alyssa M, PA-C  Active Self  acetaminophen  (TYLENOL ) 500 MG tablet [id number] Yes Take 500 mg by mouth every 6 (six) hours as needed for mild pain (pain score 1-3) or moderate pain (pain score 4-6). [provider]  Active Self  albuterol  (VENTOLIN  HFA) 108 (90 Base) MCG/ACT inhaler [id number] Yes Inhale 2 puffs into the lungs every 6 (six) hours as needed for wheezing or shortness of breath. Kassie Acquanetta Bradley, MD  Active Self  anastrozole  (ARIMIDEX ) 1 MG tablet [id number] Yes Take 1 tablet (1 mg total) by mouth daily. Gudena, Vinay, MD  Active Self  ascorbic acid (VITAMIN C) 500 MG tablet [id number] Yes Take 500 mg by mouth daily. [provider]  Active Self  aspirin  EC (ASPIRIN  81) 81 MG tablet [id number] Yes Take 1 tablet (81 mg total) by mouth in the morning. Allwardt, Mardy HERO, PA-C  Active Self  azelastine  (ASTELIN ) 0.1 % nasal spray [id number] Yes Place 2 sprays into both nostrils 2 (two) times daily. Use in each nostril as directed Allwardt, Mardy HERO, PA-C  Active Self  bisoprolol  (ZEBETA ) 5 MG tablet [id number] Yes  Take 1 tablet (5 mg total) by mouth at bedtime. Nahser, Aleene PARAS, MD  Active Self  blood glucose meter kit and supplies [id number] Yes Dispense based on patient and insurance preference. Use up to four times daily as directed. (FOR ICD-10 E10.9, E11.9). Allwardt, Mardy HERO, PA-C  Active Self  buPROPion  (WELLBUTRIN  XL) 150 MG 24 hr tablet [id number] Yes Take 1 tablet (150 mg total) by mouth every morning for depression. Allwardt, Mardy HERO, PA-C  Active Self  celecoxib  (CELEBREX ) 200 MG capsule [id number] Yes Take 1 capsule (200 mg total) by mouth 2 (two) times daily.  Allwardt, Alyssa M, PA-C  Active   CINNAMON PO [id number] Yes Take 600 mg by mouth daily with supper. [provider]  Active Self  clotrimazole -betamethasone  (LOTRISONE) cream [id number] Yes Apply 1 Application topically daily as needed (Rash). [provider]  Active Self  diclofenac  sodium (VOLTAREN ) 1 % GEL [id number] Yes Apply 2-4 g topically 4 (four) times daily as needed (as directed for pain). [provider]  Active Self  empagliflozin  (JARDIANCE ) 25 MG TABS tablet [id number] Yes Take 1 tablet (25 mg total) by mouth daily. Allwardt, Mardy HERO, PA-C  Active Self  escitalopram  (LEXAPRO ) 20 MG tablet [id number] Yes Take 1 tablet (20 mg total) by mouth daily. Allwardt, Mardy HERO, PA-C  Active Self  fenofibrate  (TRICOR ) 145 MG tablet [id number] Yes Take 1 tablet (145 mg total) by mouth at bedtime. Nahser, Aleene PARAS, MD  Active Self  Ferrous Sulfate (IRON PO) [id number] Yes Take 1 tablet by mouth daily. alternates days:1 tablet one day and 2 tablets the next day [provider]  Active Self  FIBER PO [id number] Yes Take 1 tablet by mouth daily. [provider]  Active Self  gabapentin  (NEURONTIN ) 600 MG tablet [id number] Yes Take 1 tablet (600 mg total) by mouth 3 (three) times daily. Skeet Juliene SAUNDERS, DO  Active Self  glipiZIDE  (GLUCOTROL  XL) 10 MG 24 hr tablet [id number] Yes Take 2 tablets (20 mg total) by mouth every morning. Allwardt, Mardy HERO, PA-C  Active Self  glucose blood test strip [id number] Yes Use to check blood sugar up to four times daily as directed Allwardt, Alyssa M, PA-C  Active Self  HYDROcodone -acetaminophen  (NORCO/VICODIN) 5-325 MG tablet [id number] Yes Take 1 tablet by mouth every 6 (six) hours as needed. Gerome Maurilio HERO, PA-C  Active   Incontinence Supply Disposable (DEPEND ADJUSTABLE UNDERWEAR) MISC [id number] Yes 1 Application by Does not apply route daily. Allwardt, Mardy HERO, PA-C  Active Self  insulin  glargine (LANTUS  SOLOSTAR) 100 UNIT/ML  Solostar Pen [id number] Yes Start with injecting 4 units under the skin in the evenings. Increase by 2 units every 2 days until monring fasting glucose is to goal (140). Max units 60 per day. Allwardt, Mardy HERO, PA-C  Active Self  Insulin  Pen Needle (INSUPEN PEN NEEDLES) 32G X 4 MM MISC [id number] Yes Use to inject insulin  as directed Allwardt, Mardy HERO, PA-C  Active Self  ketoconazole  (NIZORAL ) 2 % cream [id number] Yes Apply 1 Application topically daily. Allwardt, Mardy HERO, PA-C  Active Self  Lancets MISC [id number] Yes Use to check blood sugar up to four times daily as directed Allwardt, Mardy HERO, PA-C  Active Self  levothyroxine  (SYNTHROID ) 125 MCG tablet [id number] Yes Take 1 tablet (125 mcg total) by mouth daily before breakfast. Allwardt, Mardy HERO, PA-C  Active Self  loratadine  (ALLERGY RELIEF) 10 MG tablet [id number] Yes Take 1 tablet (10 mg total) by mouth every evening. Allwardt, Mardy HERO,  PA-C  Active Self  magnesium  gluconate (MAGONATE) 500 (27 Mg) MG TABS tablet [id number] Yes Take 500 mg by mouth at bedtime. [provider]  Active Self  Multiple Vitamins-Calcium  (ONE-A-DAY WOMENS PO) [id number] Yes Take 1 tablet by mouth daily. [provider]  Active Self  mupirocin  cream (BACTROBAN ) 2 % [id number] Yes Apply 1 Application topically 3 (three) times daily. Allwardt, Mardy HERO, PA-C  Active Self  nitroGLYCERIN  (NITROSTAT ) 0.4 MG SL tablet [id number] Yes Dissolve 1 tablet under the tongue every 5 minutes as needed for chest pain. Max of 3 doses, then 911. Nahser, Aleene PARAS, MD  Active Self  omeprazole  (PRILOSEC) 40 MG capsule [id number] Yes Take 1 capsule (40 mg total) by mouth every evening. Allwardt, Mardy HERO, PA-C  Active   polyethylene glycol powder (MIRALAX ) 17 GM/SCOOP powder [id number] Yes Take 17 g by mouth daily.  Patient taking differently: Take 17 g by mouth daily as needed for mild constipation or moderate constipation.   Craig Alan SAUNDERS, [place]  Active Self  Potassium 99 MG  TABS [id number] Yes Take 99 mg by mouth at bedtime. [provider]  Active Self  rOPINIRole  (REQUIP ) 1 MG tablet [id number]  Take 1 tablet (1 mg total) by mouth at bedtime. Corey, Evan S, MD  Active   rosuvastatin  (CRESTOR ) 40 MG tablet [id number] Yes Take 1 tablet (40 mg total) by mouth daily. Mona Vinie BROCKS, MD  Active Self  Semaglutide ,0.25 or 0.5MG /DOS, (OZEMPIC , 0.25 OR 0.5 MG/DOSE,) 2 MG/3ML SOPN [id number] Yes Inject 0.25 mg into the skin once a week for 28 days, THEN 0.5 mg once a week for 28 days. Allwardt, Mardy HERO, PA-C  Active Self  Tiotropium Bromide-Olodaterol (STIOLTO RESPIMAT ) 2.5-2.5 MCG/ACT AERS [id number] Yes Inhale 2 puffs into the lungs daily. Kassie Acquanetta Bradley, MD  Active Self  tiZANidine  (ZANAFLEX ) 2 MG tablet [id number] Yes Take 1 tablet (2 mg total) by mouth 3 (three) times daily. Skeet Juliene SAUNDERS, DO  Active Self  triamcinolone  (KENALOG ) 0.1 % paste [id number] Yes Apply a small amount to affected area twice daily for 2 - 3 weeks.  Patient taking differently: Use as directed 1 Application in the mouth or throat daily as needed (Mouth).     Active Self  TURMERIC PO [id number] Yes Take 450 mg by mouth daily with supper. [provider]  Active Self  Med List Note Christie Alyson Sola 07/31/23 9041): [place]            Recommendation:   Follow up in person at [facility] today to have Right ankle incision looked at for possible incision dehiscence/ need for increased incision closure support.  Follow Up Plan:   Telephone follow up appointment date/time:  09/01/23 at 3:30 pm w/ RNCM Channing Channing A. Gordy RN, BA, [facility], CRRN [place]  [facility] Population Health RN Care Manager Direct Dial: [phone]  Fax: [phone]

## 2023-08-18 NOTE — Patient Instructions (Signed)
 Visit Information  Thank you for taking time to visit with me today. Please don't hesitate to contact me if I can be of assistance to you before our next scheduled telephone appointment.  Our next appointment is by telephone on 09/01/23 at 3:30 pm  Following is a copy of your care plan:   Goals Addressed             This Visit's Progress    VBCI RN Care Plan       Problems:  Chronic Disease Management support and education needs related to DMII  Goal: Over the next 3 months the Patient will continue to work with RN Care Manager and/or Social Worker to address care management and care coordination needs related to DMII as evidenced by adherence to CM Team Scheduled appointments      Interventions:   Diabetes Interventions: Assessed patient's understanding of A1c goal: <7% Provided education to patient about basic DM disease process Reviewed medications with patient and discussed importance of medication adherence Counseled on importance of regular laboratory monitoring as prescribed Discussed plans with patient for ongoing care management follow up and provided patient with direct contact information for care management team Advised patient, providing education and rationale, to check cbg daily and record, calling PCP for findings outside established parameters Lab Results  Component Value Date   HGBA1C 9.2 (A) 03/18/2023    Patient Self-Care Activities:  Attend all scheduled provider appointments Call pharmacy for medication refills 3-7 days in advance of running out of medications Call provider office for new concerns or questions  Perform all self care activities independently  Perform IADL's (shopping, preparing meals, housekeeping, managing finances) independently Take medications as prescribed   check blood sugar at prescribed times: fasting AM blood sugar check feet daily for cuts, sores or redness enter blood sugar readings and medication or insulin  into daily  log take the blood sugar log to all doctor visits drink 6 to 8 glasses of water  each day eat fish at least once per week fill half of plate with vegetables  Plan:  The patient has been provided with contact information for the care management team and has been advised to call with any health related questions or concerns.  Next appointment wit hRN Case manager Channing is on 09/01/23 @ 3:30 pm          VBCI RN Care Plan       Problems:  Chronic Disease Management support and education needs related to 3 falls over 2 months and recent injury to Right ankle due to most recent fall going up staircase @ home.  Goal: Over the next 3 months the Patient will continue to work with Medical illustrator and/or Social Worker to address care management and care coordination needs related to 3 falls over last 2 months and sustaining an injury to right ankle as result of falling while walking up stairs as evidenced by adherence to care management team scheduled appointments      Interventions:   Falls Interventions: Provided written and verbal education re: potential causes of falls and Fall prevention strategies Reviewed medications and discussed potential side effects of medications such as dizziness and frequent urination Advised patient of importance of notifying provider of falls Assessed for signs and symptoms of orthostatic hypotension Assessed for falls since last encounter Assessed patients knowledge of fall risk prevention secondary to previously provided education Provided patient information for fall alert systems Screening for signs and symptoms of depression related to chronic disease  state  Assessed social determinant of health barriers  Patient Self-Care Activities:  Attend all scheduled provider appointments Call pharmacy for medication refills 3-7 days in advance of running out of medications Call provider office for new concerns or questions  Perform all self care activities  independently  Perform IADL's (shopping, preparing meals, housekeeping, managing finances) independently Take medications as prescribed   Work with the social worker to address care coordination needs and will continue to work with the clinical team to address health care and disease management related needs Underwent surgery on 7/18 for closed fracture of right ankle that was injured while she was walking upstairs. Currently non-weightbearing to RLE until post-op follow up on 8/1.   Plan:  The patient has been provided with contact information for the care management team and has been advised to call with any health related questions or concerns.  Next appointment with RNCM scheduled for 09/01/23 @ 3:30 pm.             Patient verbalizes understanding of instructions and care plan provided today and agrees to view in MyChart. Active MyChart status and patient understanding of how to access instructions and care plan via MyChart confirmed with patient.     The patient has been provided with contact information for the care management team and has been advised to call with any health related questions or concerns.   Please call the care guide team at [phone] if you need to cancel or reschedule your appointment.   Please call 1-800-273-TALK (toll free, 24 hour hotline) if you are experiencing a Mental Health or Behavioral Health Crisis or need someone to talk to.  Leara Rawl A. Gordy RN, BA, [facility], CRRN [place]  [facility] Population Health RN Care Manager Direct Dial: [phone]  Fax: [phone]

## 2023-08-18 NOTE — Telephone Encounter (Signed)
 Rx refill request approved per Dr. Zollie Pee orders.

## 2023-08-19 ENCOUNTER — Encounter: Payer: Self-pay | Admitting: Family

## 2023-08-19 ENCOUNTER — Ambulatory Visit

## 2023-08-20 ENCOUNTER — Encounter: Payer: Self-pay | Admitting: Oncology

## 2023-08-20 ENCOUNTER — Other Ambulatory Visit (HOSPITAL_COMMUNITY): Payer: Self-pay

## 2023-08-25 ENCOUNTER — Encounter: Payer: Self-pay | Admitting: Physician Assistant

## 2023-08-26 ENCOUNTER — Other Ambulatory Visit (HOSPITAL_COMMUNITY): Payer: Self-pay

## 2023-08-26 ENCOUNTER — Other Ambulatory Visit: Payer: Self-pay | Admitting: Family

## 2023-08-26 ENCOUNTER — Other Ambulatory Visit: Payer: Self-pay

## 2023-08-26 ENCOUNTER — Encounter

## 2023-08-26 DIAGNOSIS — S91302A Unspecified open wound, left foot, initial encounter: Secondary | ICD-10-CM

## 2023-08-26 MED ORDER — MUPIROCIN CALCIUM 2 % EX CREA
1.0000 | TOPICAL_CREAM | Freq: Three times a day (TID) | CUTANEOUS | 0 refills | Status: AC
  Filled 2023-08-26: qty 15, 5d supply, fill #0

## 2023-08-26 NOTE — Telephone Encounter (Signed)
 Please see pt msg and advise recommendations

## 2023-08-27 DIAGNOSIS — Z1231 Encounter for screening mammogram for malignant neoplasm of breast: Secondary | ICD-10-CM | POA: Diagnosis not present

## 2023-08-27 LAB — HM MAMMOGRAPHY

## 2023-09-01 ENCOUNTER — Other Ambulatory Visit: Payer: Self-pay

## 2023-09-02 ENCOUNTER — Encounter

## 2023-09-02 ENCOUNTER — Ambulatory Visit (INDEPENDENT_AMBULATORY_CARE_PROVIDER_SITE_OTHER): Admitting: Family

## 2023-09-02 ENCOUNTER — Encounter: Payer: Self-pay | Admitting: Family

## 2023-09-02 ENCOUNTER — Other Ambulatory Visit: Payer: Self-pay

## 2023-09-02 ENCOUNTER — Other Ambulatory Visit (HOSPITAL_COMMUNITY): Payer: Self-pay

## 2023-09-02 DIAGNOSIS — S99911A Unspecified injury of right ankle, initial encounter: Secondary | ICD-10-CM

## 2023-09-02 MED ORDER — DOXYCYCLINE HYCLATE 100 MG PO TABS
100.0000 mg | ORAL_TABLET | Freq: Two times a day (BID) | ORAL | 0 refills | Status: DC
  Filled 2023-09-02: qty 28, 14d supply, fill #0

## 2023-09-02 NOTE — Progress Notes (Signed)
 Office Visit Note   Patient: Felicia [name]           Date of Birth: August 11, 1950           MRN: [id number] Visit Date: 09/02/2023              Requested by: Allwardt, Mardy HERO, PA-C [address] [place],  [place] [address] PCP: Kathrene Mardy HERO, PA-C  No chief complaint on file.     HPI: The patient is a 74 year old woman who presents status post ORIF right ankle July 18 she has been weightbearing in her cam walker with 1 crutch.  Denies pain.  Reports she has 1 part of her incision that is not yet healed.  Reports scant clear orange drainage  Assessment & Plan: Visit Diagnoses: No diagnosis found.  Plan: vashe dressing changes. Placed on doxycycline . Prefers follow up in 2 weeks.  Follow-Up Instructions: No follow-ups on file.   Ortho Exam  Patient is alert, oriented, no adenopathy, well-dressed, normal affect, normal respiratory effort. On examination right ankle distally her incision has 1 area that has opened up this is about 8 mm in length 3 mm in width filled in with flat gray tissue there is no surrounding erythema this does not probe to bone no active drainage    Imaging: No results found. No images are attached to the encounter.  Labs: Lab Results  Component Value Date   HGBA1C 10.0 (A) 06/20/2023   HGBA1C 9.2 (A) 03/18/2023   HGBA1C 8.3 (A) 10/23/2022   ESRSEDRATE 24 01/31/2023   ESRSEDRATE 7 11/27/2021   ESRSEDRATE 33 (H) 04/19/2021   CRP <1.0 06/20/2023   CRP <1.0 04/19/2021     Lab Results  Component Value Date   ALBUMIN 3.9 08/01/2023   ALBUMIN 4.4 06/20/2023   ALBUMIN 4.4 12/18/2022    Lab Results  Component Value Date   MG 2.0 05/01/2020   Lab Results  Component Value Date   VD25OH 76.33 06/06/2022   VD25OH 112.91 (HH) 03/26/2022    No results found for: PREALBUMIN    Latest Ref Rng & Units 08/01/2023    6:31 PM 08/01/2023    7:40 AM 06/20/2023    2:22 PM  CBC EXTENDED  WBC 4.0 - 10.5 K/uL 20.9  19.4  14.9   RBC 3.87 -  5.11 MIL/uL 4.71  4.45  4.63   Hemoglobin 12.0 - 15.0 g/dL 85.4  86.0  85.6   HCT 36.0 - 46.0 % 45.0  42.8  43.4   Platelets 150 - 400 K/uL 302  283  257.0   NEUT# 1.7 - 7.7 K/uL  8.7  3.6   Lymph# 0.7 - 4.0 K/uL  9.7  10.5      There is no height or weight on file to calculate BMI.  Orders:  No orders of the defined types were placed in this encounter.  No orders of the defined types were placed in this encounter.    Procedures: No procedures performed  Clinical Data: No additional findings.  ROS:  All other systems negative, except as noted in the HPI. Review of Systems  Objective: Vital Signs: There were no vitals taken for this visit.  Specialty Comments:  No specialty comments available.  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   Erosive osteoarthritis of multiple sites 09/26/2021   Frequent headaches 05/31/2021   Bilateral impacted cerumen 02/22/2021   Nasal septal perforation 02/22/2021   Postnasal drip 02/22/2021   Hoarseness 01/25/2021   Hyperglycemia due to type 2 diabetes mellitus (HCC) 01/18/2021   Mixed simple and mucopurulent chronic bronchitis (HCC) 12/27/2020   Centrilobular emphysema (HCC) 07/31/2020   Shortness of breath 07/31/2020   Tongue lesion 04/30/2020   Hypothyroid    Anxiety    Anxiety and depression    Port-A-Cath in place 11/24/2019   Hepatic steatosis 09/28/2019   Malignant neoplasm of upper-outer quadrant of left breast in female, estrogen receptor positive (HCC) 09/02/2019   Trigger index finger of right hand 05/05/2019   Primary osteoarthritis of first carpometacarpal joint of right hand 05/05/2019    Osteoarthritis of finger of right hand 05/05/2019   Endometrial cancer (HCC) 10/23/2018   Cirrhosis of liver without ascites (HCC) 10/23/2018   Diarrhea 10/23/2018   Chest pain 02/15/2018   Coronary artery disease involving native coronary artery of native heart without angina pectoris 11/18/2017   Hyperlipidemia LDL goal <70 11/18/2017   Diabetes mellitus treated with insulin  and oral medication (HCC) 11/18/2017   Dizziness 11/18/2017   Type 2 diabetes mellitus without complication, without long-term current use of insulin  (HCC) 11/18/2017   Past Medical History:  Diagnosis Date   Adenomatous colon polyp 2015   Anemia    Anxiety    Breast cancer (HCC) 09/2019   left breast IMC   CAD (coronary artery disease)    a. 3/2007s/p DES to the LAD ([place] ); b. 01/2017 MV: EF 80%, small, mild apical ant defect w/o ischemia (felt to be breast atten). Low risk.   Depression    DM (diabetes mellitus) (HCC)    Emphysema lung (HCC)    Essential hypertension    GERD (gastroesophageal reflux disease)    Headache    secondary to neck surgery per patient   High triglycerides    HTN (hypertension)    Hyperlipidemia    Hypothyroid    OA (osteoarthritis)    right knee,hands   Overflow incontinence    Pneumonia 2003   PONV (postoperative nausea and vomiting)    s/p gallbladder    RLS (restless legs syndrome)    Urinary urgency    Uterine cancer (HCC) dx'd 2014    Family History  Problem Relation Age of Onset   AAA (abdominal aortic aneurysm) Mother    Thyroid  disease Mother    Colon cancer Mother    Diabetes Father    Alzheimer's disease Father    Arthritis Sister    Heart disease Sister    Throat cancer Sister    Kidney failure Sister    Diabetes Sister    Diabetes Brother    Diabetes Brother    Breast cancer Cousin     Past Surgical History:  Procedure Laterality Date   BREAST LUMPECTOMY WITH RADIOACTIVE SEED AND SENTINEL LYMPH NODE BIOPSY Left 10/07/2019   Procedure:  LEFT BREAST LUMPECTOMY WITH RADIOACTIVE SEED AND SENTINEL LYMPH NODE BIOPSY;  Surgeon: Ebbie Cough, MD;  Location: [facility];  Service: General;  Laterality: Left;   CHOLECYSTECTOMY     COLONOSCOPY     CORONARY STENT PLACEMENT     CORONARY/GRAFT ACUTE MI REVASCULARIZATION     FINGER ARTHRODESIS Right 06/11/2019   Procedure: ARTHRODESIS INDEX FINGER DISTAL PHALANGEAL  JOINT;  Surgeon: Murrell Drivers, MD;  Location: [facility];  Service: Orthopedics;  Laterality: Right;   LEFT HEART CATH AND CORONARY ANGIOGRAPHY N/A 02/16/2018   Procedure: LEFT HEART CATH AND CORONARY ANGIOGRAPHY;  Surgeon: Court Dorn PARAS, MD;  Location: [facility];  Service: Cardiovascular;  Laterality: N/A;   LEFT HEART CATH AND CORONARY ANGIOGRAPHY N/A 05/01/2020   Procedure: LEFT HEART CATH AND CORONARY ANGIOGRAPHY;  Surgeon: Court Dorn PARAS, MD;  Location: [facility];  Service: Cardiovascular;  Laterality: N/A;   ORIF ANKLE FRACTURE Right 08/01/2023   Procedure: OPEN REDUCTION INTERNAL FIXATION (ORIF) ANKLE FRACTURE, RIGHT;  Surgeon: Harden Jerona GAILS, MD;  Location: [facility];  Service: Orthopedics;  Laterality: Right;  RIGHT ORIF DISTAL FIBULA FRACTURE   PORTACATH PLACEMENT Right 11/23/2019   Procedure: INSERTION PORT-A-CATH WITH ULTRASOUND GUIDANCE;  Surgeon: Ebbie Cough, MD;  Location: [facility];  Service: General;  Laterality: Right;   POSTERIOR FUSION CERVICAL SPINE     C3-C7   REPLACEMENT TOTAL KNEE Right    SPINE SURGERY     TOTAL ABDOMINAL HYSTERECTOMY     FOR UTERUS CANCER   TOTAL HIP ARTHROPLASTY Left 2015   Social History   Occupational History   Occupation: retired  Tobacco Use   Smoking status: Never   Smokeless tobacco: Never  Vaping Use   Vaping status: Never Used  Substance and Sexual Activity   Alcohol use: Not Currently    Comment: OCCASIONALLY   Drug use: No   Sexual activity: Not Currently    Birth control/protection:  Surgical    Comment: hyst

## 2023-09-03 ENCOUNTER — Other Ambulatory Visit (HOSPITAL_COMMUNITY): Payer: Self-pay

## 2023-09-03 ENCOUNTER — Other Ambulatory Visit: Payer: Self-pay | Admitting: Physician Assistant

## 2023-09-03 ENCOUNTER — Other Ambulatory Visit: Payer: Self-pay | Admitting: Neurology

## 2023-09-03 ENCOUNTER — Other Ambulatory Visit: Payer: Self-pay

## 2023-09-03 DIAGNOSIS — E1165 Type 2 diabetes mellitus with hyperglycemia: Secondary | ICD-10-CM

## 2023-09-03 MED ORDER — OZEMPIC (0.25 OR 0.5 MG/DOSE) 2 MG/3ML ~~LOC~~ SOPN
PEN_INJECTOR | SUBCUTANEOUS | 0 refills | Status: DC
Start: 2023-09-03 — End: 2023-10-14
  Filled 2023-09-03: qty 6, 70d supply, fill #0

## 2023-09-03 MED ORDER — GABAPENTIN 600 MG PO TABS
600.0000 mg | ORAL_TABLET | Freq: Three times a day (TID) | ORAL | 0 refills | Status: DC
  Filled 2023-09-03: qty 90, 30d supply, fill #0

## 2023-09-04 ENCOUNTER — Ambulatory Visit (INDEPENDENT_AMBULATORY_CARE_PROVIDER_SITE_OTHER): Admitting: Physician Assistant

## 2023-09-04 ENCOUNTER — Encounter: Payer: Self-pay | Admitting: Physician Assistant

## 2023-09-04 VITALS — BP 122/78 | HR 78 | Temp 97.3°F | Ht 64.0 in | Wt 184.0 lb

## 2023-09-04 DIAGNOSIS — N939 Abnormal uterine and vaginal bleeding, unspecified: Secondary | ICD-10-CM

## 2023-09-04 DIAGNOSIS — S91001S Unspecified open wound, right ankle, sequela: Secondary | ICD-10-CM

## 2023-09-04 DIAGNOSIS — E1165 Type 2 diabetes mellitus with hyperglycemia: Secondary | ICD-10-CM | POA: Diagnosis not present

## 2023-09-04 DIAGNOSIS — M8580 Other specified disorders of bone density and structure, unspecified site: Secondary | ICD-10-CM

## 2023-09-04 NOTE — Progress Notes (Signed)
 Patient ID: Felicia [name], female    DOB: [birth year], 73 y.o.   MRN: [id number]   Assessment & Plan:  Vagina bleeding -     Ambulatory referral to Gynecology  Wound of right ankle, sequela -     Ambulatory referral to Wound Clinic  Uncontrolled type 2 diabetes mellitus with hyperglycemia (HCC) -     Ambulatory referral to Wound Clinic  Osteopenia, unspecified location -     DG Bone Density; Future      Assessment and Plan Assessment & Plan Postmenopausal vaginal bleeding Postmenopausal vaginal bleeding with endometrial and breast cancer history. Pelvic exam reveals discharge and bleeding from atrophic vaginal walls, likely due to skin thinning. Urgent evaluation is necessary due to cancer history. - Send urgent referral to gynecology for further evaluation  Right ankle wound after open reduction internal fixation Right ankle wound post open reduction internal fixation. Recently managed by ortho with doxycycline  and dressing changes. Due to diabetes and poor glucose control, wound care referral is warranted. The wound is healing from the inside out, and current management with antibiotics and dressing changes is appropriate. - Refer to wound care for further management - Continue current wound care regimen with antibiotics and dressing changes  Uncontrolled type 2 diabetes mellitus Uncontrolled type 2 diabetes mellitus with last A1c of 10.0 two months ago. Following with clinical pharmacy team. Glucose levels remain poorly controlled despite treatment. Scheduled to see endocrinology on September 30th for better management. Transportation has been a limiting factor for follow-up appointments. - Continue to follow with clinical pharmacy team - Increase insulin  levels gradually - Ensure follow-up with endocrinology on September 30th  Osteopenia Osteopenia noted on last bone density test. Due for repeat bone density test this year, especially important after recent fracture.  Previous bone density test was ordered by oncology team. - Order repeat bone density test      Return in about 2 weeks (around 09/18/2023) for recheck/follow-up.    Subjective:    Chief Complaint  Patient presents with   Diabetes   Vaginal Bleeding    Started this morning;    Foot Injury    HPI Discussed the use of AI scribe software for clinical note transcription with the patient, who gave verbal consent to proceed.  History of Present Illness Felicia [name] is a 73 year old female with a history of endometrial cancer who presents with vaginal bleeding.  She experienced bright red vaginal bleeding this morning, more than spotting, despite having had a complete hysterectomy. She uses pull-ups and noticed the bleeding in them today.  She has a history of a right ankle injury requiring open reduction internal fixation. Recently, she has been dealing with a wound at the site, discharging yellowish fluid. She started doxycycline  two days ago due to prolonged wound issues. She recently saw her orthopedic team for follow up two days ago and was started on doxycycline  for the wound.  Her diabetes is uncontrolled, with a last A1c of 10.0 two months ago. Despite working with a clinical pharmacy team, her glucose levels remain poorly controlled at home. She is scheduled to see an endocrinologist on September 30th for further management.  She has a history of osteopenia, identified two years ago on a bone density test. She is due for a repeat bone density test this year, especially in light of her recent fracture.  No dizziness, lightheadedness, fever, chills, or pain with urination or bowel movements. She reports stable weight and normal bowel movements.  Past Medical History:  Diagnosis Date   Adenomatous colon polyp 2015   Anemia    Anxiety    Breast cancer (HCC) 09/2019   left breast IMC   CAD (coronary artery disease)    a. 3/2007s/p DES to the LAD ([place] ); b.  01/2017 MV: EF 80%, small, mild apical ant defect w/o ischemia (felt to be breast atten). Low risk.   Depression    DM (diabetes mellitus) (HCC)    Emphysema lung (HCC)    Essential hypertension    GERD (gastroesophageal reflux disease)    Headache    secondary to neck surgery per patient   High triglycerides    HTN (hypertension)    Hyperlipidemia    Hypothyroid    OA (osteoarthritis)    right knee,hands   Overflow incontinence    Pneumonia 2003   PONV (postoperative nausea and vomiting)    s/p gallbladder    RLS (restless legs syndrome)    Urinary urgency    Uterine cancer (HCC) dx'd 2014    Past Surgical History:  Procedure Laterality Date   BREAST LUMPECTOMY WITH RADIOACTIVE SEED AND SENTINEL LYMPH NODE BIOPSY Left 10/07/2019   Procedure: LEFT BREAST LUMPECTOMY WITH RADIOACTIVE SEED AND SENTINEL LYMPH NODE BIOPSY;  Surgeon: Ebbie Cough, MD;  Location: [facility];  Service: General;  Laterality: Left;   CHOLECYSTECTOMY     COLONOSCOPY     CORONARY STENT PLACEMENT     CORONARY/GRAFT ACUTE MI REVASCULARIZATION     FINGER ARTHRODESIS Right 06/11/2019   Procedure: ARTHRODESIS INDEX FINGER DISTAL PHALANGEAL JOINT;  Surgeon: Murrell Drivers, MD;  Location: [facility];  Service: Orthopedics;  Laterality: Right;   LEFT HEART CATH AND CORONARY ANGIOGRAPHY N/A 02/16/2018   Procedure: LEFT HEART CATH AND CORONARY ANGIOGRAPHY;  Surgeon: Court Dorn PARAS, MD;  [facility];  Service: Cardiovascular;  Laterality: N/A;   LEFT HEART CATH AND CORONARY ANGIOGRAPHY N/A 05/01/2020   Procedure: LEFT HEART CATH AND CORONARY ANGIOGRAPHY;  Surgeon: Court Dorn PARAS, MD;  [facility];  Service: Cardiovascular;  Laterality: N/A;   ORIF ANKLE FRACTURE Right 08/01/2023   Procedure: OPEN REDUCTION INTERNAL FIXATION (ORIF) ANKLE FRACTURE, RIGHT;  Surgeon: Harden Jerona GAILS, MD;  Location: [facility];  Service: Orthopedics;  Laterality: Right;   RIGHT ORIF DISTAL FIBULA FRACTURE   PORTACATH PLACEMENT Right 11/23/2019   Procedure: INSERTION PORT-A-CATH WITH ULTRASOUND GUIDANCE;  Surgeon: Ebbie Cough, MD;  Location: [facility];  Service: General;  Laterality: Right;   POSTERIOR FUSION CERVICAL SPINE     C3-C7   REPLACEMENT TOTAL KNEE Right    SPINE SURGERY     TOTAL ABDOMINAL HYSTERECTOMY     FOR UTERUS CANCER   TOTAL HIP ARTHROPLASTY Left 2015    Family History  Problem Relation Age of Onset   AAA (abdominal aortic aneurysm) Mother    Thyroid  disease Mother    Colon cancer Mother    Diabetes Father    Alzheimer's disease Father    Arthritis Sister    Heart disease Sister    Throat cancer Sister    Kidney failure Sister    Diabetes Sister    Diabetes Brother    Diabetes Brother    Breast cancer Cousin     Social History   Tobacco Use   Smoking status: Never   Smokeless tobacco: Never  Vaping Use   Vaping status: Never Used  Substance Use Topics   Alcohol use:  Not Currently    Comment: OCCASIONALLY   Drug use: No     Allergies  Allergen Reactions   Chloraprep One Step [Chlorhexidine  Gluconate] Rash   Estrogens Other (See Comments)    PATIENT HAS A HISTORY OF CANCER AND HAS BEEN TOLD TO NEVER TAKE ANYTHING CONTAINING ESTROGEN, AS IT MIGHT CAUSE A RECURRENCE   Shellfish-Derived Products Nausea And Vomiting   Shrimp [Shellfish Allergy] Nausea And Vomiting    Review of Systems NEGATIVE UNLESS OTHERWISE INDICATED IN HPI      Objective:     BP 122/78 (BP Location: Right Arm, Patient Position: Sitting, Cuff Size: Normal)   Pulse 78   Temp (!) 97.3 F (36.3 C) (Temporal)   Ht 5' 4 (1.626 m)   Wt 184 lb (83.5 kg)   SpO2 96%   BMI 31.58 kg/m   Wt Readings from Last 3 Encounters:  09/04/23 184 lb (83.5 kg)  08/01/23 184 lb (83.5 kg)  07/08/23 186 lb 3.2 oz (84.5 kg)    BP Readings from Last 3 Encounters:  09/04/23 122/78  08/02/23 (!) 97/59  07/08/23 (!) 104/58      Physical Exam Vitals and nursing note reviewed.  Constitutional:      General: She is not in acute distress.    Appearance: Normal appearance. She is not ill-appearing.  HENT:     Head: Normocephalic.     Right Ear: External ear normal.     Left Ear: External ear normal.     Nose: No congestion.     Mouth/Throat:     Mouth: Mucous membranes are moist.     Pharynx: No oropharyngeal exudate or posterior oropharyngeal erythema.  Eyes:     Extraocular Movements: Extraocular movements intact.     Conjunctiva/sclera: Conjunctivae normal.     Pupils: Pupils are equal, round, and reactive to light.  Cardiovascular:     Rate and Rhythm: Normal rate and regular rhythm.     Pulses: Normal pulses. No decreased pulses.     Heart sounds: Normal heart sounds. No murmur heard. Pulmonary:     Effort: Pulmonary effort is normal. No respiratory distress.     Breath sounds: Normal breath sounds. No wheezing.  Musculoskeletal:     Cervical back: Normal range of motion.     Right lower leg: No edema.     Left lower leg: No edema.     Comments: Atrophy muscles noted bilateral lower legs   Skin:    General: Skin is warm.     Findings: Lesion (wound RLE - see photo) present.  Neurological:     General: No focal deficit present.     Mental Status: She is alert and oriented to person, place, and time.     Cranial Nerves: No cranial nerve deficit.     Sensory: No sensory deficit.  Psychiatric:        Mood and Affect: Mood normal.        Behavior: Behavior normal.             Royale Swamy M Shiloh Southern, PA-C

## 2023-09-04 NOTE — Patient Instructions (Addendum)
 Urgent referral to GYN Referral also placed to wound clinic Continue wound management as ortho team advised  Bone density test has been ordered to update this year  Your need to call your oncology team for follow-up - Dr. Odean Address: [address] [place], [place], [place] [address] Phone: [phone]     VISIT SUMMARY: During your visit, we addressed your postmenopausal vaginal bleeding, right ankle wound, uncontrolled diabetes, and osteopenia. We have made several referrals and adjustments to your treatment plan to better manage these conditions.  YOUR PLAN: POSTMENOPAUSAL VAGINAL BLEEDING: You experienced vaginal bleeding despite having had a complete hysterectomy. This is concerning given your history of endometrial cancer. -We are sending an urgent referral to gynecology for further evaluation.  RIGHT ANKLE WOUND AFTER OPEN REDUCTION INTERNAL FIXATION: You have a wound on your right ankle that is being treated with antibiotics and dressing changes. Your diabetes is making it harder for the wound to heal. -We are referring you to wound care for further management. -Continue your current wound care regimen with antibiotics and dressing changes.  UNCONTROLLED TYPE 2 DIABETES MELLITUS: Your diabetes is not well controlled, with a recent A1c of 10.0. You are scheduled to see an endocrinologist soon. -Continue to follow up with the clinical pharmacy team. -Gradually increase your insulin  levels. -Ensure you attend your endocrinology appointment on September 30th.  OSTEOPENIA: You have osteopenia and are due for a repeat bone density test, especially after your recent fracture. -We will order a repeat bone density test.                      Contains text generated by Abridge.                                 Contains text generated by Abridge.

## 2023-09-08 ENCOUNTER — Other Ambulatory Visit: Payer: Self-pay

## 2023-09-09 ENCOUNTER — Ambulatory Visit: Attending: Physician Assistant

## 2023-09-09 ENCOUNTER — Ambulatory Visit: Attending: General Surgery

## 2023-09-09 DIAGNOSIS — M6281 Muscle weakness (generalized): Secondary | ICD-10-CM | POA: Diagnosis not present

## 2023-09-09 DIAGNOSIS — R279 Unspecified lack of coordination: Secondary | ICD-10-CM | POA: Insufficient documentation

## 2023-09-09 DIAGNOSIS — Z483 Aftercare following surgery for neoplasm: Secondary | ICD-10-CM | POA: Insufficient documentation

## 2023-09-09 DIAGNOSIS — R293 Abnormal posture: Secondary | ICD-10-CM | POA: Diagnosis not present

## 2023-09-09 DIAGNOSIS — M62838 Other muscle spasm: Secondary | ICD-10-CM | POA: Diagnosis not present

## 2023-09-09 NOTE — Therapy (Signed)
 OUTPATIENT PHYSICAL THERAPY FEMALE PELVIC TREATMENT   Patient Name: Felicia [name] MRN: [id number] DOB:1950/08/15, 73 y.o., female Today's Date: 09/09/2023  END OF SESSION:  PT End of Session - 09/09/23 1449     Visit Number 6    Date for PT Re-Evaluation 10/06/23    Authorization Type UHC Medicare dual complete    PT Start Time 1445    PT Stop Time 1525    PT Time Calculation (min) 40 min    Activity Tolerance Patient tolerated treatment well    Behavior During Therapy WFL for tasks assessed/performed            Past Medical History:  Diagnosis Date   Adenomatous colon polyp 2015   Anemia    Anxiety    Breast cancer (HCC) 09/2019   left breast IMC   CAD (coronary artery disease)    a. 3/2007s/p DES to the LAD ([place] ); b. 01/2017 MV: EF 80%, small, mild apical ant defect w/o ischemia (felt to be breast atten). Low risk.   Depression    DM (diabetes mellitus) (HCC)    Emphysema lung (HCC)    Essential hypertension    GERD (gastroesophageal reflux disease)    Headache    secondary to neck surgery per patient   High triglycerides    HTN (hypertension)    Hyperlipidemia    Hypothyroid    OA (osteoarthritis)    right knee,hands   Overflow incontinence    Pneumonia 2003   PONV (postoperative nausea and vomiting)    s/p gallbladder    RLS (restless legs syndrome)    Urinary urgency    Uterine cancer (HCC) dx'd 2014   Past Surgical History:  Procedure Laterality Date   BREAST LUMPECTOMY WITH RADIOACTIVE SEED AND SENTINEL LYMPH NODE BIOPSY Left 10/07/2019   Procedure: LEFT BREAST LUMPECTOMY WITH RADIOACTIVE SEED AND SENTINEL LYMPH NODE BIOPSY;  Surgeon: Ebbie Cough, MD;  Location: [facility];  Service: General;  Laterality: Left;   CHOLECYSTECTOMY     COLONOSCOPY     CORONARY STENT PLACEMENT     CORONARY/GRAFT ACUTE MI REVASCULARIZATION     FINGER ARTHRODESIS Right 06/11/2019   Procedure: ARTHRODESIS INDEX FINGER DISTAL PHALANGEAL  JOINT;  Surgeon: Murrell Drivers, MD;  Location: [facility];  Service: Orthopedics;  Laterality: Right;   LEFT HEART CATH AND CORONARY ANGIOGRAPHY N/A 02/16/2018   Procedure: LEFT HEART CATH AND CORONARY ANGIOGRAPHY;  Surgeon: Court Dorn PARAS, MD;  [facility]: [facility];  Service: Cardiovascular;  Laterality: N/A;   LEFT HEART CATH AND CORONARY ANGIOGRAPHY N/A 05/01/2020   Procedure: LEFT HEART CATH AND CORONARY ANGIOGRAPHY;  Surgeon: Court Dorn PARAS, MD;  Location: [facility];  Service: Cardiovascular;  Laterality: N/A;   ORIF ANKLE FRACTURE Right 08/01/2023   Procedure: OPEN REDUCTION INTERNAL FIXATION (ORIF) ANKLE FRACTURE, RIGHT;  Surgeon: Harden Jerona GAILS, MD;  Location: [facility];  Service: Orthopedics;  Laterality: Right;  RIGHT ORIF DISTAL FIBULA FRACTURE   PORTACATH PLACEMENT Right 11/23/2019   Procedure: INSERTION PORT-A-CATH WITH ULTRASOUND GUIDANCE;  Surgeon: Ebbie Cough, MD;  Location: [facility];  Service: General;  Laterality: Right;   POSTERIOR FUSION CERVICAL SPINE     C3-C7   REPLACEMENT TOTAL KNEE Right    SPINE SURGERY     TOTAL ABDOMINAL HYSTERECTOMY     FOR UTERUS CANCER   TOTAL HIP ARTHROPLASTY Left 2015   Patient Active Problem List   Diagnosis Date Noted   Osteopenia  09/04/2023   Closed fracture of right ankle 08/01/2023   Ankle fracture 08/01/2023   Disorder associated with type 2 diabetes mellitus (HCC) 06/20/2023   Restless leg syndrome 05/26/2023   Referred otalgia of left ear 11/19/2022   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   Erosive osteoarthritis of multiple sites 09/26/2021   Frequent headaches 05/31/2021   Bilateral impacted cerumen 02/22/2021   Nasal septal perforation 02/22/2021   Postnasal drip 02/22/2021   Hoarseness 01/25/2021   Hyperglycemia due to type 2 diabetes mellitus (HCC) 01/18/2021   Mixed simple and mucopurulent chronic bronchitis (HCC) 12/27/2020    Centrilobular emphysema (HCC) 07/31/2020   Shortness of breath 07/31/2020   Tongue lesion 04/30/2020   Hypothyroid    Anxiety    Anxiety and depression    Port-A-Cath in place 11/24/2019   Hepatic steatosis 09/28/2019   Malignant neoplasm of upper-outer quadrant of left breast in female, estrogen receptor positive (HCC) 09/02/2019   Trigger index finger of right hand 05/05/2019   Primary osteoarthritis of first carpometacarpal joint of right hand 05/05/2019   Osteoarthritis of finger of right hand 05/05/2019   Endometrial cancer (HCC) 10/23/2018   Cirrhosis of liver without ascites (HCC) 10/23/2018   Diarrhea 10/23/2018   Chest pain 02/15/2018   Coronary artery disease involving native coronary artery of native heart without angina pectoris 11/18/2017   Hyperlipidemia LDL goal <70 11/18/2017   Diabetes mellitus treated with insulin  and oral medication (HCC) 11/18/2017   Dizziness 11/18/2017   Type 2 diabetes mellitus without complication, without long-term current use of insulin  (HCC) 11/18/2017    PCP: Kathrene Mardy HERO, PA-C  REFERRING PROVIDER: Craig Alan SAUNDERS, PA-C   REFERRING DIAG: K52.9 (ICD-10-CM) - Chronic diarrhea  THERAPY DIAG:  Muscle weakness (generalized)  Unspecified lack of coordination  Abnormal posture  Other muscle spasm  Rationale for Evaluation and Treatment: Rehabilitation  ONSET DATE: 3 years ago  SUBJECTIVE:                                                                                                                                                                                           SUBJECTIVE STATEMENT: Pt has had difficult couple of months with falling and breaking ankle and then having to have surgery. She is doing better now with mobility, but the distal part of her incision is not healing. MD has ordered wound care for her.   Last week she noticed vaginal bleeding on 09/04/23 and made an appointment with PCP that day. They have  referred her to gynecologist and appointment is October 9th. She also has oncology appointment on 09/22/23.  She has stopped taking Miralax  because she has been having more diarrhea. She is now having Bristol Stool Scale number 3-4.She very rarely leaks stool anymore, but did have any accident over the weekend when getting up from chair.   She states that bladder is doing really well.   PAIN: 09/09/23 Are you having pain? Yes NPRS scale: 5/10 Pain location: hips, low back, LE  Pain type: aching Pain description: constant   Aggravating factors: walking, standing  Relieving factors: lying down   PRECAUTIONS: Other: hx of uterine and breast cancer   RED FLAGS: None   WEIGHT BEARING RESTRICTIONS: No  FALLS:  Has patient fallen in last 6 months? Yes. Number of falls 1x, 12/20/23 (tripped over curb)  OCCUPATION: not working  ACTIVITY LEVEL : none currently  PLOF: Independent  PATIENT GOALS: control over bowel movements   PERTINENT HISTORY:  Uterine  cancer with vaginal radiation 2014, invasive mammary carcinoma Lt 2021 with lumpectomy and chemoradiation, anastrozole , CAD with stent 2018, emphysema, cirrhosis, type 2 diabetes, cholecystectomy, Rt TKA, Lt THA, total abdominal hysterectomy (from cancer)   BOWEL MOVEMENT: Pain with bowel movement: No - states that she hardly feels it Type of bowel movement:Type (Bristol Stool Scale) 5-7, Frequency many times a day, usually small amounts every time she urinates, and Strain no - just comes out Fully empty rectum: No Leakage: Yes: unaware, with passing gas Pads: Yes: 3 briefs on bad days (day and night) Fiber supplement/laxative Yes and Miralax   URINATION: Pain with urination: No Fully empty bladder: Yes:   Stream: Strong Urgency: Yes  Frequency: over 3 or 4 hours; no nocturia Leakage: Urge to void, Walking to the bathroom, and standing up Pads: Yes: 3 briefs a day/night  INTERCOURSE:  Nor sexually active and no history of  pain  PREGNANCY: Vaginal deliveries 1 Tearing No Episiotomy No C-section deliveries 1 Currently pregnant No  PROLAPSE: None   OBJECTIVE:  Note: Objective measures were completed at Evaluation unless otherwise noted. 07/14/23               Internal Pelvic Floor: vaginal stenosis, scar tissue restriction, decreased mobility; improved coordination in rectum with increased sensation, but still difficulty with consistent contraction on her own without cues - cue of hugging finger was most helpful and she was able to consistently squeeze  Patient confirms identification and approves PT to assess internal pelvic floor and treatment Yes  PELVIC MMT:   MMT 07/14/23  Vaginal 1/5, 5 repeat contractions, 5 second hold  Internal Anal Sphincter 2/5 with cuing  External Anal Sphincter 2/5 with cuing  Puborectalis 2/5 with cuing  (Blank rows = not tested)        TONE: low  PROLAPSE: None detected, but difficulty with coordination of appropriate bearing down  04/23/23: PATIENT SURVEYS:   PFIQ-7: 67  COGNITION: Overall cognitive status: Within functional limits for tasks assessed     SENSATION: Light touch: Appears intact  FUNCTIONAL TESTS:  Squat: bil valgus knee collapse Single leg stance:  Rt: compensated trendelenburg with UE support  Lt: pelvic drop with UE support Curl-up test: significant abdominal distortion    GAIT: Assistive device utilized: None Comments: Rt antalgic pattern, decreased bil hip extension  POSTURE: rounded shoulders, forward head, decreased lumbar lordosis, increased thoracic kyphosis, and posterior pelvic tilt   LUMBARAROM/PROM:  A/PROM A/PROM  Eval (% available)  Flexion 75  Extension 50  Right lateral flexion 50  Left lateral flexion 50  Right rotation 50  Left rotation 50   (  Blank rows = not tested)   PALPATION:   General: significant tightness in bil obliques and lumbar paraspinals   Abdominal: abdominal distention and tightness;  scar tissue                  External Perineal Exam: dry                             Internal Pelvic Floor: vaginal stenosis, scar tissue restriction, decreased mobility; decreased rectal sensation; decreased ability to create appropriate intra-rectal pressure when attempting to push like during bowel movement   Patient confirms identification and approves PT to assess internal pelvic floor and treatment Yes  PELVIC MMT:   MMT eval  Vaginal No active contraction  Internal Anal Sphincter No active contraction  External Anal Sphincter 1/5  Puborectalis 1/5  Diastasis Recti 4 finger widths   (Blank rows = not tested)        TONE: low  PROLAPSE: None detected, but difficulty with coordination of appropriate bearing down   TODAY'S TREATMENT:                                                                                                                              DATE:  09/09/23 Neuromuscular re-education: Bridge with hip adduction, transversus abdominus, and pelvic floor muscle 10x - persistent cramping Supine bent knee fall out red band 10x Supine march 2 x 10 Supine leg extensions 5x bil Therapeutic activities: Urge drill: working on not rushing to the bathroom; use for bowel movements and urination Performing pelvic floor muscle contraction with sit>stand transition HEP review and update   07/14/23 RE-EVAL Manual: Pt provides verbal consent for internal vaginal/rectal pelvic floor exam. Internal pelvic floor muscle reassessment, vaginal and rectal Neuromuscular re-education: Internal vaginal and rectal pelvic floor muscle contraction training Urge drill Long holds Quick flicks Therapeutic activities: Urge drill Squatty potty HEP updates: Counter squats 3 way kick Heel raises Bridges Leg extensions   07/07/23 Manual: Soft tissue mobilization/trigger point release to Rt glutes and lumbar paraspinals  Neuromuscular re-education: Unilateral side lying UE ball  press with transversus abdominus and pelvic floor muscle contractions and breath coordination 10x Side lying clam shells 10x bil Bridge with hip adduction, transversus abdominus, and pelvic floor muscle 10x Supine hip abduction red band 10x   PATIENT EDUCATION:  Education details: See above Person educated: Patient Education method: Programmer, multimedia, Facilities manager, Actor cues, Verbal cues, and Handouts Education comprehension: verbalized understanding  HOME EXERCISE PROGRAM: [id number]  ASSESSMENT:  CLINICAL IMPRESSION: Even with ankle fracture and surgery in the last 2 months, she is doing very well with bowel movements and bladder control. Due to progress, she feels ready to discharge; this will also allow her to focus on her wound care and finding out what is going on with vaginal bleeding. We went over HEP today and updated with several more supine exercises to help her slowly build  back strength. She did have cramping with bridges today; she was encouraged to keep attempting these to help slowly build hamstring strength. Due to progress and having met her personal goals for being in pelvic floor physical therapy, she is prepared to discharge skilled PT intervention; she was encouraged to call with any questions or concerns.   OBJECTIVE IMPAIRMENTS: decreased activity tolerance, decreased coordination, decreased endurance, decreased mobility, decreased ROM, decreased strength, increased fascial restrictions, increased muscle spasms, impaired tone, postural dysfunction, and pain.   ACTIVITY LIMITATIONS: continence  PARTICIPATION LIMITATIONS: community activity  PERSONAL FACTORS: 3+ comorbidities: medical history are also affecting patient's functional outcome.   REHAB POTENTIAL: Good  CLINICAL DECISION MAKING: Evolving/moderate complexity  EVALUATION COMPLEXITY: Moderate   GOALS: Goals reviewed with patient? Yes  SHORT TERM GOALS: Updated 09/09/23   Pt will be independent with  HEP.   Baseline: Goal status: MET 07/14/23  2.  Pt will report 25% improvement in fecal incontinence.  Baseline:  Goal status: MET 07/14/23  3.  Pt will be able to teach back and utilize urge suppression technique in order to help reduce number of trips to the bathroom.    Baseline:  Goal status: MET 07/14/23  4.  Pt will decrease number of bowel movements to no more than 3 a day in order to decrease skin irritation and demonstrate more complete emptying.  Baseline: Pt states that doing miralax  and fiber, she is seeing some good progress and has one bowel movements a day with possibly a second. Goal status: MET 07/14/23  5.  Pt will be independent with use of squatty potty, relaxed toileting mechanics, and improved bowel movement techniques in order to increase ease of bowel movements and complete evacuation so she can decrease leaking.   Baseline:  Goal status: MET 09/09/23   LONG TERM GOALS: Updated 09/09/23  Pt will be independent with advanced HEP.   Baseline:  Goal status: MET 09/09/23  2.  Pt will report 75% improvement in fecal incontinence.  Baseline:  Goal status: MET 09/09/23  3.  Pt will be able to pass gas without fecal incontinence in order to decrease number of briefs she is using daily.  Baseline: pt is 90% better Goal status: MET 09/09/23  4.  Pt will report no more than 1 brief a day in order to decrease amount she is having to buy and demonstrate improved bowel control.  Baseline: just 1 Goal status: MET 09/09/23  5.  Pt will demonstrate normal pelvic floor muscle tone and A/ROM, able to achieve 4/5 strength with contractions and 10 sec endurance, in order to provide appropriate lumbopelvic support in functional activities.   Baseline:  Goal status: DISCHARGED 09/09/23  6.  Pt will be able to go 2-3 hours in between voids without urgency or incontinence in order to improve QOL and perform all functional activities with less difficulty.   Baseline:  Goal  status: MET 09/09/23  PLAN:  PT FREQUENCY: -  PT DURATION: -  PLANNED INTERVENTIONS: -  PLAN FOR NEXT SESSION: DC  PHYSICAL THERAPY DISCHARGE SUMMARY  Visits from Start of Care: 6  Current functional level related to goals / functional outcomes: Independent   Remaining deficits: See above   Education / Equipment: HEP   Patient agrees to discharge. Patient goals were partially met. Patient is being discharged due to being pleased with the current functional level.  Josette Mares, PT, DPT08/26/252:50 PM

## 2023-09-09 NOTE — Therapy (Signed)
 OUTPATIENT PHYSICAL THERAPY SOZO SCREENING NOTE   Patient Name: Felicia [name] MRN: [id number] DOB:1950/01/19, 73 y.o., female Today's Date: 09/09/2023  PCP: Kathrene Mardy HERO, PA-C REFERRING PROVIDER: Ebbie Cough, MD   PT End of Session - 09/09/23 1543     Visit Number 6   unchanged due to screen   Authorization Type UHC Medicare dual complete    PT Start Time 1539    PT Stop Time 1543    PT Time Calculation (min) 4 min    Activity Tolerance Patient tolerated treatment well    Behavior During Therapy [facility] for tasks assessed/performed          Past Medical History:  Diagnosis Date   Adenomatous colon polyp 2015   Anemia    Anxiety    Breast cancer (HCC) 09/2019   left breast IMC   CAD (coronary artery disease)    a. 3/2007s/p DES to the LAD ([place] ); b. 01/2017 MV: EF 80%, small, mild apical ant defect w/o ischemia (felt to be breast atten). Low risk.   Depression    DM (diabetes mellitus) (HCC)    Emphysema lung (HCC)    Essential hypertension    GERD (gastroesophageal reflux disease)    Headache    secondary to neck surgery per patient   High triglycerides    HTN (hypertension)    Hyperlipidemia    Hypothyroid    OA (osteoarthritis)    right knee,hands   Overflow incontinence    Pneumonia 2003   PONV (postoperative nausea and vomiting)    s/p gallbladder    RLS (restless legs syndrome)    Urinary urgency    Uterine cancer (HCC) dx'd 2014   Past Surgical History:  Procedure Laterality Date   BREAST LUMPECTOMY WITH RADIOACTIVE SEED AND SENTINEL LYMPH NODE BIOPSY Left 10/07/2019   Procedure: LEFT BREAST LUMPECTOMY WITH RADIOACTIVE SEED AND SENTINEL LYMPH NODE BIOPSY;  Surgeon: Ebbie Cough, MD;  Location: [facility];  Service: General;  Laterality: Left;   CHOLECYSTECTOMY     COLONOSCOPY     CORONARY STENT PLACEMENT     CORONARY/GRAFT ACUTE MI REVASCULARIZATION     FINGER ARTHRODESIS Right 06/11/2019   Procedure:  ARTHRODESIS INDEX FINGER DISTAL PHALANGEAL JOINT;  Surgeon: Murrell Drivers, MD;  Location: [facility];  Service: Orthopedics;  Laterality: Right;   LEFT HEART CATH AND CORONARY ANGIOGRAPHY N/A 02/16/2018   Procedure: LEFT HEART CATH AND CORONARY ANGIOGRAPHY;  Surgeon: Court Dorn PARAS, MD;  [facility];  Service: Cardiovascular;  Laterality: N/A;   LEFT HEART CATH AND CORONARY ANGIOGRAPHY N/A 05/01/2020   Procedure: LEFT HEART CATH AND CORONARY ANGIOGRAPHY;  Surgeon: Court Dorn PARAS, MD;  [facility];  Service: Cardiovascular;  Laterality: N/A;   ORIF ANKLE FRACTURE Right 08/01/2023   Procedure: OPEN REDUCTION INTERNAL FIXATION (ORIF) ANKLE FRACTURE, RIGHT;  Surgeon: Harden Jerona GAILS, MD;  Location: [facility];  Service: Orthopedics;  Laterality: Right;  RIGHT ORIF DISTAL FIBULA FRACTURE   PORTACATH PLACEMENT Right 11/23/2019   Procedure: INSERTION PORT-A-CATH WITH ULTRASOUND GUIDANCE;  Surgeon: Ebbie Cough, MD;  Location:  [facility];  Service: General;  Laterality: Right;   POSTERIOR FUSION CERVICAL SPINE     C3-C7   REPLACEMENT TOTAL KNEE Right    SPINE SURGERY     TOTAL ABDOMINAL HYSTERECTOMY     FOR UTERUS CANCER   TOTAL HIP ARTHROPLASTY Left 2015   Patient Active Problem List   Diagnosis Date Noted  Osteopenia 09/04/2023   Closed fracture of right ankle 08/01/2023   Ankle fracture 08/01/2023   Disorder associated with type 2 diabetes mellitus (HCC) 06/20/2023   Restless leg syndrome 05/26/2023   Referred otalgia of left ear 11/19/2022   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   Erosive osteoarthritis of multiple sites 09/26/2021   Frequent headaches 05/31/2021   Bilateral impacted cerumen 02/22/2021   Nasal septal perforation 02/22/2021   Postnasal drip 02/22/2021   Hoarseness 01/25/2021   Hyperglycemia due to type 2 diabetes mellitus (HCC) 01/18/2021   Mixed simple and  mucopurulent chronic bronchitis (HCC) 12/27/2020   Centrilobular emphysema (HCC) 07/31/2020   Shortness of breath 07/31/2020   Tongue lesion 04/30/2020   Hypothyroid    Anxiety    Anxiety and depression    Port-A-Cath in place 11/24/2019   Hepatic steatosis 09/28/2019   Malignant neoplasm of upper-outer quadrant of left breast in female, estrogen receptor positive (HCC) 09/02/2019   Trigger index finger of right hand 05/05/2019   Primary osteoarthritis of first carpometacarpal joint of right hand 05/05/2019   Osteoarthritis of finger of right hand 05/05/2019   Endometrial cancer (HCC) 10/23/2018   Cirrhosis of liver without ascites (HCC) 10/23/2018   Diarrhea 10/23/2018   Chest pain 02/15/2018   Coronary artery disease involving native coronary artery of native heart without angina pectoris 11/18/2017   Hyperlipidemia LDL goal <70 11/18/2017   Diabetes mellitus treated with insulin  and oral medication (HCC) 11/18/2017   Dizziness 11/18/2017   Type 2 diabetes mellitus without complication, without long-term current use of insulin  (HCC) 11/18/2017    REFERRING DIAG: left breast cancer at risk for lymphedema  THERAPY DIAG:  Aftercare following surgery for neoplasm  PERTINENT HISTORY:  Patient was diagnosed on 08/03/2019 with 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 She underwent a left lumpectomy and 1 positive node removed on 10/07/2019. Ki67 is 20%. She has a history of uterine cancer in 2014 treated with hysterectomy and radiation, a left hip replacement, right knee replacement, and a C4-C7 fusion in 2014. Diabetes, history of breast cancer (LEFT), history of endometrial cancer (with history of radiation), spine surgery C3-C7 fusion 2017, heart disease, L THA 2015, history of 3 right knee scopes and right TKA, history of inner ear issue (required epley maneuver),   PRECAUTIONS: left UE Lymphedema risk,   SUBJECTIVE: has not noticed any UE swelling. She is  having some follow up tests for possible return of cancer.  PAIN:  Are you having pain? No  SOZO SCREENING: Patient was assessed today using the SOZO machine to determine the lymphedema index score. This was compared to her baseline score. It was determined that she is within the recommended range when compared to her baseline and no further action is needed at this time. She will continue SOZO screenings. These are done every 3 months for 2 years post operatively followed by every 6 months for 2 years, and then annually.  L-DEX FLOWSHEETS - 09/09/23 1500       L-DEX LYMPHEDEMA SCREENING   Measurement Type Unilateral    L-DEX MEASUREMENT EXTREMITY Upper Extremity    POSITION  Standing    DOMINANT SIDE Right    At Risk Side Left    BASELINE SCORE (UNILATERAL) 3.3    L-DEX SCORE (UNILATERAL) 9    VALUE CHANGE (UNILAT) 5.7           Pt is 4 years post op, and still in green but  elevated 5.7 above baseline. Pt will return in 3 months for another screen just to be sure.  Grayce JINNY Sheldon, PT 09/09/2023, 3:46 PM

## 2023-09-10 ENCOUNTER — Other Ambulatory Visit (HOSPITAL_COMMUNITY): Payer: Self-pay

## 2023-09-10 ENCOUNTER — Other Ambulatory Visit: Payer: Self-pay | Admitting: Physician Assistant

## 2023-09-10 ENCOUNTER — Other Ambulatory Visit: Payer: Self-pay

## 2023-09-10 ENCOUNTER — Telehealth: Payer: Self-pay | Admitting: Family

## 2023-09-10 MED ORDER — LEVOTHYROXINE SODIUM 125 MCG PO TABS
125.0000 ug | ORAL_TABLET | Freq: Every day | ORAL | 2 refills | Status: DC
Start: 2023-09-10 — End: 2023-12-02
  Filled 2023-09-10: qty 30, 30d supply, fill #0
  Filled 2023-10-05: qty 30, 30d supply, fill #1
  Filled 2023-11-03: qty 30, 30d supply, fill #2

## 2023-09-10 NOTE — Patient Instructions (Signed)
 Visit Information  Thank you for taking time to visit with me today. Please don't hesitate to contact me if I can be of assistance to you before our next scheduled telephone appointment.  Our next appointment is by telephone on 09/24/23 at 3:15 pm  Following is a copy of your care plan:   Goals Addressed             This Visit's Progress    VBCI RN Care Plan       Problems:  Chronic Disease Management support and education needs related to DMII  Goal: Over the next 3 months the Patient will continue to work with RN Care Manager and/or Social Worker to address care management and care coordination needs related to DMII as evidenced by adherence to CM Team Scheduled appointments      Interventions:   Diabetes Interventions: Assessed patient's understanding of A1c goal: <7% Provided education to patient about basic DM disease process Reviewed medications with patient and discussed importance of medication adherence Counseled on importance of regular laboratory monitoring as prescribed Discussed plans with patient for ongoing care management follow up and provided patient with direct contact information for care management team Advised patient, providing education and rationale, to check cbg daily and record, calling PCP for findings outside established parameters Lab Results  Component Value Date   HGBA1C 9.2 (A) 03/18/2023    Patient Self-Care Activities:  Attend all scheduled provider appointments Call pharmacy for medication refills 3-7 days in advance of running out of medications Call provider office for new concerns or questions  Perform all self care activities independently  Perform IADL's (shopping, preparing meals, housekeeping, managing finances) independently Take medications as prescribed   check blood sugar at prescribed times: fasting AM blood sugar check feet daily for cuts, sores or redness enter blood sugar readings and medication or insulin  into daily  log take the blood sugar log to all doctor visits drink 6 to 8 glasses of water  each day eat fish at least once per week fill half of plate with vegetables  Plan:  The patient has been provided with contact information for the care management team and has been advised to call with any health related questions or concerns.  Next appointment wit hRN Case manager Channing is on 09/24/23 @ 3:15 pm          VBCI RN Care Plan       Problems:  Chronic Disease Management support and education needs related to 3 falls over 2 months and recent injury to Right ankle due to most recent fall going up staircase @ home.  Goal: Over the next 3 months the Patient will continue to work with Medical illustrator and/or Social Worker to address care management and care coordination needs related to 3 falls over last 2 months and sustaining an injury to right ankle as result of falling while walking up stairs as evidenced by adherence to care management team scheduled appointments      Interventions:   Falls Interventions: Provided written and verbal education re: potential causes of falls and Fall prevention strategies Reviewed medications and discussed potential side effects of medications such as dizziness and frequent urination Advised patient of importance of notifying provider of falls Assessed for signs and symptoms of orthostatic hypotension Assessed for falls since last encounter Assessed patients knowledge of fall risk prevention secondary to previously provided education Provided patient information for fall alert systems Screening for signs and symptoms of depression related to chronic disease  state  Assessed social determinant of health barriers  Patient Self-Care Activities:  Attend all scheduled provider appointments Call pharmacy for medication refills 3-7 days in advance of running out of medications Call provider office for new concerns or questions  Perform all self care activities  independently  Perform IADL's (shopping, preparing meals, housekeeping, managing finances) independently Take medications as prescribed   Work with the social worker to address care coordination needs and will continue to work with the clinical team to address health care and disease management related needs Underwent surgery on 7/18 for closed fracture of right ankle that was injured while she was walking upstairs. Currently non-weightbearing to RLE until post-op follow up on 8/1.   Plan:  The patient has been provided with contact information for the care management team and has been advised to call with any health related questions or concerns.  Next appointment with RNCM scheduled for 09/24/23 @ 3:15 pm.             Patient verbalizes understanding of instructions and care plan provided today and agrees to view in MyChart. Active MyChart status and patient understanding of how to access instructions and care plan via MyChart confirmed with patient.     The patient has been provided with contact information for the care management team and has been advised to call with any health related questions or concerns.   Please call the care guide team at [phone] if you need to cancel or reschedule your appointment.   Please call 1-800-273-TALK (toll free, 24 hour hotline) if you are experiencing a Mental Health or Behavioral Health Crisis or need someone to talk to.  Tanish Prien A. Gordy RN, BA, [facility], CRRN [place]  [facility] Population Health RN Care Manager Direct Dial: [phone]  Fax: [phone]

## 2023-09-10 NOTE — Telephone Encounter (Signed)
 Patient called and said that the medication that she prescribed for her is making her have allergic reaction on her legs, arms and chest. CB#[phone]

## 2023-09-10 NOTE — Patient Outreach (Signed)
 Complex Care Management   Visit Note  09/08/2023  Name:  Felicia [name] MRN: [id number] DOB: [birth year]  Situation: Referral received for Complex Care Management related to Diabetes with Complications and High Risk for Falls/History of Frequent Falls, ORIF Right Ankle fracture sustained in recent fall.  I obtained verbal consent from Patient.  Visit completed with Patient  on the phone  Background:   Past Medical History:  Diagnosis Date   Adenomatous colon polyp 2015   Anemia    Anxiety    Breast cancer (HCC) 09/2019   left breast IMC   CAD (coronary artery disease)    a. 3/2007s/p DES to the LAD ([place] ); b. 01/2017 MV: EF 80%, small, mild apical ant defect w/o ischemia (felt to be breast atten). Low risk.   Depression    DM (diabetes mellitus) (HCC)    Emphysema lung (HCC)    Essential hypertension    GERD (gastroesophageal reflux disease)    Headache    secondary to neck surgery per patient   High triglycerides    HTN (hypertension)    Hyperlipidemia    Hypothyroid    OA (osteoarthritis)    right knee,hands   Overflow incontinence    Pneumonia 2003   PONV (postoperative nausea and vomiting)    s/p gallbladder    RLS (restless legs syndrome)    Urinary urgency    Uterine cancer (HCC) dx'd 2014    Assessment: Patient Reported Symptoms:  Cognitive Cognitive Status: No symptoms reported, Normal speech and language skills, Alert and oriented to person, place, and time, Insightful and able to interpret abstract concepts Cognitive/Intellectual Conditions Management [RPT]: None reported or documented in medical history or problem list   Health Maintenance Behaviors: Annual physical exam, Exercise, Healthy diet Healing Pattern: Unsure Health Facilitated by: Healthy diet, Pain control  Neurological Neurological Review of Symptoms: No symptoms reported Neurological Self-Management Outcome: 4 (good)  HEENT HEENT Symptoms Reported: No symptoms reported HEENT  Management Strategies: Routine screening HEENT Self-Management Outcome: 4 (good)    Cardiovascular Cardiovascular Symptoms Reported: No symptoms reported Does patient have uncontrolled Hypertension?: No Cardiovascular Management Strategies: Medication therapy Cardiovascular Self-Management Outcome: 4 (good)  Respiratory Respiratory Symptoms Reported: No symptoms reported    Endocrine Endocrine Symptoms Reported: No symptoms reported Is patient diabetic?: Yes Is patient checking blood sugars at home?: Yes List most recent blood sugar readings, include date and time of day: Patient sustained recent ankle fracture & underwent surgical repair on 7/18 - ankle healing slowly, per PCP most likely due to patient's diabetes, surgical incisionalso dehisced. DM not well controlled (a1c = 10) - pt has upcoming appt w/ Endocrinologist Endocrine Self-Management Outcome: 4 (good)  Gastrointestinal Gastrointestinal Symptoms Reported: No symptoms reported      Genitourinary Genitourinary Symptoms Reported: No symptoms reported    Integumentary Integumentary Symptoms Reported: Incision (dehisced surgical incision healing very slowly) Skin Management Strategies: Dressing changes Skin Self-Management Outcome: 3 (uncertain)  Musculoskeletal Musculoskelatal Symptoms Reviewed: Difficulty walking, Unsteady gait, Limited mobility Musculoskeletal Management Strategies: Medical device, Medication therapy, Adequate rest, Coping strategies, Exercise Musculoskeletal Self-Management Outcome: 3 (uncertain) Falls in the past year?: Yes Number of falls in past year: 2 or more Was there an injury with Fall?: Yes Fall Risk Category Calculator: 3 Patient Fall Risk Level: High Fall Risk Patient at Risk for Falls Due to: No Fall Risks Fall risk Follow up: Falls evaluation completed  Psychosocial Psychosocial Symptoms Reported: No symptoms reported          09/10/2023  PHQ2-9 Depression Screening   Little interest  or pleasure in doing things    Feeling down, depressed, or hopeless    PHQ-2 - Total Score    Trouble falling or staying asleep, or sleeping too much    Feeling tired or having little energy    Poor appetite or overeating     Feeling bad about yourself - or that you are a failure or have let yourself or your family down    Trouble concentrating on things, such as reading the newspaper or watching television    Moving or speaking so slowly that other people could have noticed.  Or the opposite - being so fidgety or restless that you have been moving around a lot more than usual    Thoughts that you would be better off dead, or hurting yourself in some way    PHQ2-9 Total Score    If you checked off any problems, how difficult have these problems made it for you to do your work, take care of things at home, or get along with other people    Depression Interventions/Treatment      There were no vitals filed for this visit.  Medications Reviewed Today     Reviewed by Gordy Channing LABOR, RN (Registered Nurse) on 09/08/23 at 1616  Med List Status: <None>   Medication Order Taking? Sig Documenting Provider Last Dose Status Informant  ACCU-CHEK GUIDE test strip [id number] Yes USE TO check blood glucose UP TO four times daily AS DIRECTED Allwardt, Alyssa M, PA-C  Active Self  Accu-Chek Softclix Lancets lancets [id number] Yes USE TO check blood glucose UP TO four times daily AS DIRECTED Allwardt, Alyssa M, PA-C  Active Self  acetaminophen  (TYLENOL ) 500 MG tablet [id number] Yes Take 500 mg by mouth every 6 (six) hours as needed for mild pain (pain score 1-3) or moderate pain (pain score 4-6). [provider]  Active Self  albuterol  (VENTOLIN  HFA) 108 (90 Base) MCG/ACT inhaler [id number] Yes Inhale 2 puffs into the lungs every 6 (six) hours as needed for wheezing or shortness of breath. Kassie Acquanetta Bradley, MD  Active Self  anastrozole  (ARIMIDEX ) 1 MG tablet [id number] Yes Take 1 tablet (1 mg total)  by mouth daily. Gudena, Vinay, MD  Active Self  ascorbic acid (VITAMIN C) 500 MG tablet [id number] Yes Take 500 mg by mouth daily. [provider]  Active Self  aspirin  EC (ASPIRIN  81) 81 MG tablet [id number] Yes Take 1 tablet (81 mg total) by mouth in the morning. Allwardt, Mardy HERO, PA-C  Active Self  azelastine  (ASTELIN ) 0.1 % nasal spray [id number] Yes Place 2 sprays into both nostrils 2 (two) times daily. Use in each nostril as directed Allwardt, Alyssa M, PA-C  Active Self  bisoprolol  (ZEBETA ) 5 MG tablet [id number] Yes Take 1 tablet (5 mg total) by mouth at bedtime. Nahser, Aleene PARAS, MD  Active Self  blood glucose meter kit and supplies [id number] Yes Dispense based on patient and insurance preference. Use up to four times daily as directed. (FOR ICD-10 E10.9, E11.9). Allwardt, Mardy HERO, PA-C  Active Self  buPROPion  (WELLBUTRIN  XL) 150 MG 24 hr tablet [id number] Yes Take 1 tablet (150 mg total) by mouth every morning for depression. Allwardt, Mardy HERO, PA-C  Active Self  celecoxib  (CELEBREX ) 200 MG capsule [id number] Yes Take 1 capsule (200 mg total) by mouth 2 (two) times daily. Allwardt, Alyssa M, PA-C  Active   CINNAMON PO [id number] Yes Take 600 mg by mouth daily  with supper. [provider]  Active Self  clotrimazole -betamethasone  (LOTRISONE) cream [id number] Yes Apply 1 Application topically daily as needed (Rash). [provider]  Active Self  diclofenac  sodium (VOLTAREN ) 1 % GEL [id number] Yes Apply 2-4 g topically 4 (four) times daily as needed (as directed for pain). [provider]  Active Self  doxycycline  (VIBRA -TABS) 100 MG tablet [id number] Yes Take 1 tablet (100 mg total) by mouth 2 (two) times daily. Zamora, Erin R, NP  Active   empagliflozin  (JARDIANCE ) 25 MG TABS tablet [id number] Yes Take 1 tablet (25 mg total) by mouth daily. Allwardt, Mardy HERO, PA-C  Active Self  escitalopram  (LEXAPRO ) 20 MG tablet [id number] Yes Take 1 tablet (20 mg total) by mouth  daily. Allwardt, Mardy HERO, PA-C  Active Self  fenofibrate  (TRICOR ) 145 MG tablet [id number] Yes Take 1 tablet (145 mg total) by mouth at bedtime. Nahser, Aleene PARAS, MD  Active Self  Ferrous Sulfate (IRON PO) [id number] Yes Take 1 tablet by mouth daily. alternates days:1 tablet one day and 2 tablets the next day [provider]  Active Self  FIBER PO [id number] Yes Take 1 tablet by mouth daily. [provider]  Active Self  gabapentin  (NEURONTIN ) 600 MG tablet [id number] Yes Take 1 tablet (600 mg total) by mouth 3 (three) times daily. Skeet Juliene SAUNDERS, DO  Active   glipiZIDE  (GLUCOTROL  XL) 10 MG 24 hr tablet [id number] Yes Take 2 tablets (20 mg total) by mouth every morning. Allwardt, Mardy HERO, PA-C  Active Self  glucose blood test strip [id number] Yes Use to check blood sugar up to four times daily as directed Allwardt, Alyssa M, PA-C  Active Self  HYDROcodone -acetaminophen  (NORCO/VICODIN) 5-325 MG tablet [id number] Yes Take 1 tablet by mouth every 6 (six) hours as needed. Gerome Maurilio HERO, PA-C  Active   Incontinence Supply Disposable (DEPEND ADJUSTABLE UNDERWEAR) MISC [id number] Yes 1 Application by Does not apply route daily. Allwardt, Mardy HERO, PA-C  Active Self  insulin  glargine (LANTUS  SOLOSTAR) 100 UNIT/ML Solostar Pen [id number] Yes Start with injecting 4 units under the skin in the evenings. Increase by 2 units every 2 days until monring fasting glucose is to goal (140). Max units 60 per day. Allwardt, Mardy HERO, PA-C  Active Self  Insulin  Pen Needle (INSUPEN PEN NEEDLES) 32G X 4 MM MISC [id number] Yes Use to inject insulin  as directed Allwardt, Mardy HERO, PA-C  Active Self  ketoconazole  (NIZORAL ) 2 % cream [id number] Yes Apply 1 Application topically daily. Allwardt, Mardy HERO, PA-C  Active Self  Lancets MISC [id number] Yes Use to check blood sugar up to four times daily as directed Allwardt, Mardy HERO, PA-C  Active Self  levothyroxine  (SYNTHROID ) 125 MCG tablet [id number] Yes Take 1 tablet (125  mcg total) by mouth daily before breakfast. Allwardt, Mardy HERO, PA-C  Active Self  loratadine  (ALLERGY RELIEF) 10 MG tablet [id number] Yes Take 1 tablet (10 mg total) by mouth every evening. Allwardt, Mardy HERO, PA-C  Active Self  magnesium  gluconate (MAGONATE) 500 (27 Mg) MG TABS tablet [id number] Yes Take 500 mg by mouth at bedtime. [provider]  Active Self  Multiple Vitamins-Calcium  (ONE-A-DAY WOMENS PO) [id number] Yes Take 1 tablet by mouth daily. [provider]  Active Self  mupirocin  cream (BACTROBAN ) 2 % [id number] Yes Apply 1 Application topically 3 (three) times daily. Zamora, Erin R, NP  Active   nitroGLYCERIN  (NITROSTAT ) 0.4 MG SL tablet [id number] Yes Dissolve 1 tablet under the tongue every 5 minutes as needed for  chest pain. Max of 3 doses, then 911. Nahser, Aleene PARAS, MD  Active Self  omeprazole  (PRILOSEC) 40 MG capsule [id number] Yes Take 1 capsule (40 mg total) by mouth every evening. Allwardt, Mardy HERO, PA-C  Active   polyethylene glycol powder (MIRALAX ) 17 GM/SCOOP powder [id number] Yes Take 17 g by mouth daily. Craig Alan SAUNDERS, [place]  Active Self  Potassium 99 MG TABS [id number] Yes Take 99 mg by mouth at bedtime. [provider]  Active Self  rOPINIRole  (REQUIP ) 1 MG tablet [id number] Yes Take 1 tablet (1 mg total) by mouth at bedtime. Corey, Evan S, MD  Active   rosuvastatin  (CRESTOR ) 40 MG tablet [id number] Yes Take 1 tablet (40 mg total) by mouth daily. Mona Vinie BROCKS, MD  Active Self  Semaglutide ,0.25 or 0.5MG /DOS, (OZEMPIC , 0.25 OR 0.5 MG/DOSE,) 2 MG/3ML SOPN [id number] Yes Inject 0.25 mg into the skin once a week for 28 days, THEN 0.5 mg once a week for 28 days. Allwardt, Mardy HERO, PA-C  Active   Tiotropium Bromide-Olodaterol (STIOLTO RESPIMAT ) 2.5-2.5 MCG/ACT AERS [id number] Yes Inhale 2 puffs into the lungs daily. Kassie Acquanetta Bradley, MD  Active Self  tiZANidine  (ZANAFLEX ) 2 MG tablet [id number] Yes Take 1 tablet (2 mg total) by mouth 3 (three) times daily.  Skeet Juliene SAUNDERS, DO  Active Self  triamcinolone  (KENALOG ) 0.1 % paste [id number] Yes Apply a small amount to affected area twice daily for 2 - 3 weeks.   Active Self  TURMERIC PO [id number] Yes Take 450 mg by mouth daily with supper. [provider]  Active Self  Med List Note Christie Alyson Sola 07/31/23 9041): Jolynn cone            Recommendation:   Specialty provider follow-up with Endocrinologist regarding poorly controlled DM2, and Orthopedic Surgeon who repaired recent Right Ankle fracture  Follow Up Plan:   Telephone follow up appointment date/time:  09/24/2023 at 3:15 pm with RN Care Manager Channing LITTIE Channing A. Gordy RN, BA, [facility], CRRN [place]  [facility] Population Health RN Care Manager Direct Dial: [phone]  Fax: [phone]

## 2023-09-11 ENCOUNTER — Other Ambulatory Visit (HOSPITAL_COMMUNITY): Payer: Self-pay

## 2023-09-11 ENCOUNTER — Other Ambulatory Visit: Payer: Self-pay

## 2023-09-11 ENCOUNTER — Other Ambulatory Visit: Payer: Self-pay | Admitting: Physician Assistant

## 2023-09-11 MED ORDER — FENOFIBRATE 145 MG PO TABS
145.0000 mg | ORAL_TABLET | Freq: Every day | ORAL | 3 refills | Status: AC
  Filled 2023-09-11: qty 90, 90d supply, fill #0
  Filled 2023-12-03: qty 90, 90d supply, fill #1

## 2023-09-12 ENCOUNTER — Other Ambulatory Visit (HOSPITAL_COMMUNITY): Payer: Self-pay

## 2023-09-12 ENCOUNTER — Other Ambulatory Visit: Payer: Self-pay

## 2023-09-12 ENCOUNTER — Other Ambulatory Visit: Payer: Self-pay | Admitting: Physician Assistant

## 2023-09-12 ENCOUNTER — Other Ambulatory Visit: Payer: Self-pay | Admitting: Pharmacist

## 2023-09-12 ENCOUNTER — Telehealth: Payer: Self-pay

## 2023-09-12 DIAGNOSIS — Z5982 Transportation insecurity: Secondary | ICD-10-CM

## 2023-09-12 MED ORDER — SULFAMETHOXAZOLE-TRIMETHOPRIM 800-160 MG PO TABS
1.0000 | ORAL_TABLET | Freq: Two times a day (BID) | ORAL | 0 refills | Status: DC
  Filled 2023-09-12: qty 14, 7d supply, fill #0

## 2023-09-12 NOTE — Telephone Encounter (Signed)
 I called patient and advised.

## 2023-09-12 NOTE — Telephone Encounter (Signed)
 Patient called stating that she had an allergic reaction to the doxycycline  and stopped taking it.  Wanted to know if something else could be called into her pharmacy.  CB# [phone].  Please advise.  Thank you  Can you please advise on message?

## 2023-09-12 NOTE — Progress Notes (Signed)
 09/12/23 Name: Felicia [name] MRN: [id number] DOB: Jun 09, 1950  No chief complaint on file.   Felicia [name] is a 73 y.o. year old female who presented for a telephone visit.   They were referred to the pharmacist by their PCP for assistance in managing diabetes, complex medication management, and frequent falls.   Subjective: Patient reports she was recently prescribed doxycyline and had an allergic reaction to it. She had a rash and itching. She stopped doxycyline and has been taking benadryl .  Denied shortness of breath or difficulty breathing.   Care Team: Primary Care Provider: Allwardt, Mardy HERO, PA-C ; Next Scheduled Visit: 09/18/2023 Endocrinologist Dr Mercie; Next Scheduled Visit: 10/14/2023 - initial visit Neurologist: Skeet; Next Scheduled Visit: 09/29/2023  Medication Access/Adherence  Current Pharmacy:  [name] - [facility] [address] [place] [address] Phone: [phone] Fax: [phone]  [name] [address] [place] [address] Phone: [phone] Fax: [phone]   Patient reports affordability concerns with their medications: No  Patient reports access/transportation concerns to their pharmacy: No  Patient reports adherence concerns with their medications:  No      Diabetes:  Current medications:  Jardiance  25mg  daily, glipizide  10mg  - take 2 tablets every morning, Lantus  56 units once a day. Ozempic  0.5mg  weekly   Medications tried in the past: Trulicity  - stopped because she had trouble getting it; Mounjaro  - chart states she stopped due to diarrhea and night sweats but patient did not remember taking Mounjaro ; metformin  - stopped because suspected was worsening diarrhea (fecal incontinence)   Current glucose readings: 166, 169, 190, 171  Using Accu-Chek meter; testing 1 to 2 times daily She tried Continuous Glucose Monitor in past but didn't like it - sensor did not stay on and  too many alerts. States the sensors just don't work with me   Patient reports no hypoglycemic s/sx including no dizziness, shakiness, sweating once in the last 2 weeks.  Patient denies hyperglycemic symptoms including no polyuria, polydipsia, polyphagia, nocturia, neuropathy, blurred vision.  Receiving Meals on Wheels at home for next few months. She generally likes what they send. She doesn't like collards but will eats a little. She will sometimes add veggies like Brussels sprouts.  Snacks - tried cottage cheese + blueberries but didn't like them together she prefers to each separate. Sugar Snap peas  Current medication access support: none  Hypertension:  Current medications: bisoprolol  5mg  once at bedtime  Patient denies hypotensive s/sx including no dizziness, lightheadedness.  Patient denies hypertensive symptoms including no headache, chest pain, shortness of breath  Hyperlipidemia:  Last LDL was 199 She restarted rosuvastatin  10mg  daily 07/23/2023.  Patient report she is tolerating rosuvastatin  without any side effects.   Objective:  BP Readings from Last 3 Encounters:  09/04/23 122/78  08/02/23 (!) 97/59  07/08/23 (!) 104/58     Lab Results  Component Value Date   HGBA1C 10.0 (A) 06/20/2023    Lab Results  Component Value Date   CREATININE 1.02 (H) 08/01/2023   BUN 21 08/01/2023   NA 135 08/01/2023   K 4.3 08/01/2023   CL 101 08/01/2023   CO2 21 (L) 08/01/2023    Lab Results  Component Value Date   CHOL 281 (H) 11/14/2022   HDL 46 11/14/2022   LDLCALC 161 (H) 11/14/2022   TRIG 389 (H) 11/14/2022   CHOLHDL 6.1 (H) 11/14/2022    Medications Reviewed Today   Medications were not reviewed in this  encounter       Assessment/Plan:   Diabetes: Currently uncontrolled - but improving with Lantus  titration - Reviewed goal A1c, goal fasting, and goal 2 hour post prandial glucose - Recommend to continue Jardiance , glipizide   - Increase Lantus  from 56  to 58 units once a day - Continue Ozempic  0.5mg  weekly  - Recommend to check glucose 1 to 2 times a day a varying times of day.  - Discussed lower sugar / carbohydrate snack options - continue cottage cheese or sugar snap peas in place of cookies.    Hypertension:Currently controlled - Reviewed long term cardiovascular and renal outcomes of uncontrolled blood pressure - Recommended to check home blood pressure and heart rate when she feels lightheaded / dizzy - Recommend to continue bisoprolol  (could consider ACEI or ARB in the future)    Hyperlipidemia  -continue rosuvastatin  20mg  daily - due to recheck lipids in September 2025  Medication Management: - Recommended she contact orthopedist to let them know she had to stop doxycycline .  - recommended she make appointment for yearly eye exam  Follow Up Plan: 4 weeks  Felicia [name], PharmD Clinical Pharmacist [facility]  Population Health [phone]

## 2023-09-12 NOTE — Telephone Encounter (Signed)
 I'm not sure where the communication was lost as it looks like she initially called about this on Wednesday.  [facility]actrim  to [facility]

## 2023-09-14 ENCOUNTER — Other Ambulatory Visit (HOSPITAL_COMMUNITY): Payer: Self-pay

## 2023-09-15 ENCOUNTER — Encounter: Payer: Self-pay | Admitting: Physician Assistant

## 2023-09-16 ENCOUNTER — Other Ambulatory Visit: Payer: Self-pay

## 2023-09-16 ENCOUNTER — Other Ambulatory Visit (HOSPITAL_COMMUNITY): Payer: Self-pay

## 2023-09-16 ENCOUNTER — Encounter

## 2023-09-16 ENCOUNTER — Telehealth: Payer: Self-pay | Admitting: *Deleted

## 2023-09-16 ENCOUNTER — Other Ambulatory Visit: Payer: Self-pay | Admitting: Physician Assistant

## 2023-09-16 MED ORDER — TRAMADOL HCL 50 MG PO TABS
50.0000 mg | ORAL_TABLET | Freq: Three times a day (TID) | ORAL | 0 refills | Status: DC | PRN
  Filled 2023-09-16: qty 30, 10d supply, fill #0

## 2023-09-16 NOTE — Progress Notes (Signed)
 Complex Care Management Note Care Guide Note  09/16/2023 Name: Tashiba Timoney MRN: [id number] DOB: 15-Mar-1950   Complex Care Management Outreach Attempts: An unsuccessful telephone outreach was attempted today to offer the patient information about available complex care management services.  Follow Up Plan:  Additional outreach attempts will be made to offer the patient complex care management information and services.   Encounter Outcome:  No Answer  [facility] Health Care Guide  Direct [phone] Fax:[phone] Website: [email or web address]

## 2023-09-16 NOTE — Telephone Encounter (Signed)
 Please see pt msg for refill

## 2023-09-18 ENCOUNTER — Ambulatory Visit: Admitting: Physician Assistant

## 2023-09-20 ENCOUNTER — Encounter: Payer: Self-pay | Admitting: Physician Assistant

## 2023-09-22 ENCOUNTER — Other Ambulatory Visit (HOSPITAL_COMMUNITY): Payer: Self-pay

## 2023-09-22 ENCOUNTER — Inpatient Hospital Stay

## 2023-09-22 ENCOUNTER — Inpatient Hospital Stay: Attending: Hematology and Oncology | Admitting: Hematology and Oncology

## 2023-09-22 VITALS — BP 131/61 | HR 73 | Temp 98.0°F | Resp 17 | Wt 185.1 lb

## 2023-09-22 DIAGNOSIS — Z7985 Long-term (current) use of injectable non-insulin antidiabetic drugs: Secondary | ICD-10-CM | POA: Insufficient documentation

## 2023-09-22 DIAGNOSIS — C50412 Malignant neoplasm of upper-outer quadrant of left female breast: Secondary | ICD-10-CM | POA: Diagnosis not present

## 2023-09-22 DIAGNOSIS — E11622 Type 2 diabetes mellitus with other skin ulcer: Secondary | ICD-10-CM | POA: Diagnosis not present

## 2023-09-22 DIAGNOSIS — N950 Postmenopausal bleeding: Secondary | ICD-10-CM | POA: Insufficient documentation

## 2023-09-22 DIAGNOSIS — M898X9 Other specified disorders of bone, unspecified site: Secondary | ICD-10-CM | POA: Insufficient documentation

## 2023-09-22 DIAGNOSIS — Z90710 Acquired absence of both cervix and uterus: Secondary | ICD-10-CM | POA: Diagnosis not present

## 2023-09-22 DIAGNOSIS — Z170 Estrogen receptor positive status [ER+]: Secondary | ICD-10-CM | POA: Diagnosis not present

## 2023-09-22 DIAGNOSIS — Z1721 Progesterone receptor positive status: Secondary | ICD-10-CM | POA: Diagnosis not present

## 2023-09-22 DIAGNOSIS — Z79899 Other long term (current) drug therapy: Secondary | ICD-10-CM | POA: Insufficient documentation

## 2023-09-22 DIAGNOSIS — Z1731 Human epidermal growth factor receptor 2 positive status: Secondary | ICD-10-CM | POA: Diagnosis not present

## 2023-09-22 DIAGNOSIS — Z79890 Hormone replacement therapy (postmenopausal): Secondary | ICD-10-CM | POA: Diagnosis not present

## 2023-09-22 DIAGNOSIS — Z7982 Long term (current) use of aspirin: Secondary | ICD-10-CM | POA: Insufficient documentation

## 2023-09-22 DIAGNOSIS — L97909 Non-pressure chronic ulcer of unspecified part of unspecified lower leg with unspecified severity: Secondary | ICD-10-CM | POA: Insufficient documentation

## 2023-09-22 DIAGNOSIS — C541 Malignant neoplasm of endometrium: Secondary | ICD-10-CM | POA: Diagnosis present

## 2023-09-22 DIAGNOSIS — Z79811 Long term (current) use of aromatase inhibitors: Secondary | ICD-10-CM | POA: Insufficient documentation

## 2023-09-22 DIAGNOSIS — Z881 Allergy status to other antibiotic agents status: Secondary | ICD-10-CM | POA: Diagnosis not present

## 2023-09-22 DIAGNOSIS — S82892A Other fracture of left lower leg, initial encounter for closed fracture: Secondary | ICD-10-CM | POA: Insufficient documentation

## 2023-09-22 DIAGNOSIS — Z780 Asymptomatic menopausal state: Secondary | ICD-10-CM | POA: Diagnosis not present

## 2023-09-22 DIAGNOSIS — Z90722 Acquired absence of ovaries, bilateral: Secondary | ICD-10-CM | POA: Insufficient documentation

## 2023-09-22 DIAGNOSIS — I6782 Cerebral ischemia: Secondary | ICD-10-CM | POA: Diagnosis not present

## 2023-09-22 DIAGNOSIS — M8580 Other specified disorders of bone density and structure, unspecified site: Secondary | ICD-10-CM | POA: Diagnosis not present

## 2023-09-22 DIAGNOSIS — Z91013 Allergy to seafood: Secondary | ICD-10-CM | POA: Insufficient documentation

## 2023-09-22 LAB — URINALYSIS, COMPLETE (UACMP) WITH MICROSCOPIC
Bacteria, UA: NONE SEEN
Bilirubin Urine: NEGATIVE
Glucose, UA: >=500 mg/dL — AB
Hgb urine dipstick: NEGATIVE
Ketones, ur: NEGATIVE mg/dL
Leukocytes,Ua: NEGATIVE
Nitrite: NEGATIVE
Protein, ur: NEGATIVE mg/dL
Specific Gravity, Urine: 1.029 (ref 1.005–1.030)
pH: 5 (ref 5.0–8.0)

## 2023-09-22 MED ORDER — ANASTROZOLE 1 MG PO TABS
1.0000 mg | ORAL_TABLET | Freq: Every day | ORAL | 4 refills | Status: AC
  Filled 2023-09-22 – 2023-11-02 (×2): qty 90, 90d supply, fill #0
  Filled 2024-02-17: qty 90, 90d supply, fill #1

## 2023-09-22 NOTE — Progress Notes (Signed)
 Patient Care Team: Allwardt, Mardy HERO, PA-C as PCP - General (Physician Assistant) Nahser, Aleene PARAS, MD (Inactive) as PCP - Cardiology (Cardiology) Ebbie Cough, MD as Consulting Physician (General Surgery) Izell Domino, MD as Attending Physician (Radiation Oncology) Ishmael Slough, MD as Consulting Physician (Rheumatology) Murrell Drivers, MD as Consulting Physician (Orthopedic Surgery) Josephina Aures, MD as Consulting Physician (Endocrinology) Kassie Acquanetta Bradley, MD as Consulting Physician (Pulmonary Disease) Odean Potts, MD as Consulting Physician (Hematology and Oncology) Nicholaus Sherlean CROME, [facility] (Inactive) as Pharmacist (Pharmacist) Gordy Channing LABOR, RN as [facility] Care Management  DIAGNOSIS:  Encounter Diagnosis  Name Primary?   Malignant neoplasm of upper-outer quadrant of left breast in female, estrogen receptor positive (HCC) Yes    SUMMARY OF ONCOLOGIC HISTORY: Oncology History Overview Note   Cancer Staging  Malignant neoplasm of upper-outer quadrant of left breast in female, estrogen receptor positive (HCC) Staging form: Breast, AJCC 8th Edition - Clinical stage from 09/08/2019: Stage IA (cT1c, cN0, cM0, G2, ER+, PR+, HER2+) - Unsigned - Pathologic stage from 10/07/2019: Stage IA (pT1c, pN1, cM0, G2, ER+, PR+, HER2+) - Signed by Crawford Morna Pickle, NP on 01/09/2021    Malignant neoplasm of upper-outer quadrant of left breast in female, estrogen receptor positive (HCC)  08/30/2019 Initial Biopsy   Diagnosis Breast, left, needle core biopsy, 2 o'clock, 10cmfn - INVASIVE MAMMARY CARCINOMA. Microscopic Comment The carcinoma is nuclear grade 2. Micropapillary features are noted. The greatest linear extent of tumor in any one core is 6 mm. ADDENDUM: Immunohistochemistry for E-cadherin is positive supporting a ductal phenotype.  PROGNOSTIC INDICATORS Results: The tumor cells are EQUIVOCAL for Her2 (2+). Her2 by FISH will be performed and results reported  separately. Estrogen Receptor: 100%, POSITIVE, STRONG STAINING INTENSITY Progesterone Receptor: 40%, POSITIVE, STRONG STAINING INTENSITY Proliferation Marker Ki67: 20%  FLUORESCENCE IN-SITU HYBRIDIZATION Results: GROUP 1: HER2 **POSITIVE**   09/02/2019 Initial Diagnosis   Malignant neoplasm of upper-outer quadrant of left breast in female, estrogen receptor positive (HCC)   09/30/2019 - 09/30/2019 Chemotherapy   The patient had trastuzumab -dkst (OGIVRI ) 630 mg in sodium chloride  0.9 % 250 mL chemo infusion, 8 mg/kg, Intravenous,  Once, 0 of 5 cycles  for chemotherapy treatment.    10/07/2019 Definitive Surgery   FINAL MICROSCOPIC DIAGNOSIS:   A. BREAST, LEFT, LUMPECTOMY:  -  Invasive ductal carcinoma, Nottingham grade 2 of 3, 1.4 cm  -  Ductal carcinoma in-situ, high grade  -  Margins uninvolved by carcinoma (see parts C, D and E)  -  Previous biopsy site changes present  -  See oncology table and comment below   B. LYMPH NODE, LEFT AXILLARY, SENTINEL, EXCISION:  -  Metastatic carcinoma involving one lymph node (1/1), 0.3 cm  -  Focal extracapsular extension   C. BREAST, LEFT, ADDITIONAL MEDIAL MARGIN, EXCISION:  -  Usual ductal hyperplasia  -  No residual carcinoma identified  -  See comment   D. BREAST, LEFT, ADDITIONAL INFERIOR MARGIN, EXCISION:  -  No residual carcinoma identified   E. BREAST, LEFT, ADDITIONAL POSTERIOR MARGIN, EXCISION:  -  No residual carcinoma identified   COMMENT:   A.  The carcinoma has micropapillary features.   C.  Cytokeratin 5/6 and E-cadherin are positive in the usual ductal  hyperplasia.    11/24/2019 - 01/28/2020 Chemotherapy   adjuvant chemotherapy consisting of carboplatin , gemcitabine , trastuzumab  given every 3 weeks x4  -discontinued after 4 cycles with poor tolerance.        02/16/2020 - 11/16/2020 Chemotherapy   Patient  is on Treatment Plan : BREAST ADO-Trastuzumab Emtansine  (Kadcyla ) q21d      03/30/2020 - 05/12/2020 Radiation  Therapy   Site Technique Total Dose (Gy) Dose per Fx (Gy) Completed Fx Beam Energies  Breast, Left: Breast_Lt 3D 50/50 2 25/25 10X  Breast, Left: Breast_Lt_SCV_PAB 3D 50/50 2 25/25 10X, 15X  Breast, Left: Breast_Lt_Bst 3D 10/10 2 5/5 6X, 10X     04/11/2020 Imaging   EXAM: MRI HEAD WITHOUT AND WITH CONTRAST  IMPRESSION: 1. No evidence of intracranial metastatic disease. 2. Mild chronic small vessel ischemia.   11/16/2020 -  Anti-estrogen oral therapy   Anastrozole , 1 mg daily     CHIEF COMPLIANT: Follow-up on anastrozole  therapy    History of Present Illness .Felicia [name] is a 73 year old female with breast cancer who presents for follow-up regarding her ongoing treatment and recent symptoms.  She has been on anastrozole  for three years following her breast cancer diagnosis in 2021 without any issues. Recently, she experienced significant vaginal bleeding, with darkish urine observed, raising concerns about the source. A gynecologist appointment is scheduled for October 7th. She is considering a urinary tract infection as a possible cause and is open to a urine test.  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 She is confused about the source of her recent bleeding, given her hysterectomy history, and is concerned about the potential for cancer recurrence.     ALLERGIES:  is allergic to chloraprep one step [chlorhexidine  gluconate], estrogens, shellfish-derived products, shrimp [shellfish allergy], and doxycycline .  MEDICATIONS:  Current Outpatient Medications  Medication Sig Dispense Refill   sulfamethoxazole -trimethoprim  (BACTRIM  DS) 800-160 MG tablet Take 1 tablet by mouth 2 (two) times daily. 14 tablet 0   ACCU-CHEK GUIDE test strip USE TO check blood glucose UP TO four times daily AS DIRECTED 100 strip 0   Accu-Chek Softclix Lancets lancets USE TO check blood glucose UP TO four times daily AS DIRECTED 100 each 0   acetaminophen   (TYLENOL ) 500 MG tablet Take 500 mg by mouth every 6 (six) hours as needed for mild pain (pain score 1-3) or moderate pain (pain score 4-6).     albuterol  (VENTOLIN  HFA) 108 (90 Base) MCG/ACT inhaler Inhale 2 puffs into the lungs every 6 (six) hours as needed for wheezing or shortness of breath. 6.7 g 2   anastrozole  (ARIMIDEX ) 1 MG tablet Take 1 tablet (1 mg total) by mouth daily. 90 tablet 4   ascorbic acid (VITAMIN C) 500 MG tablet Take 500 mg by mouth daily.     aspirin  EC (ASPIRIN  81) 81 MG tablet Take 1 tablet (81 mg total) by mouth in the morning. 90 tablet 99   azelastine  (ASTELIN ) 0.1 % nasal spray Place 2 sprays into both nostrils 2 (two) times daily. Use in each nostril as directed 30 mL 11   bisoprolol  (ZEBETA ) 5 MG tablet Take 1 tablet (5 mg total) by mouth at bedtime. 90 tablet 3   blood glucose meter kit and supplies Dispense based on patient and insurance preference. Use up to four times daily as directed. (FOR ICD-10 E10.9, E11.9). 1 each 0   buPROPion  (WELLBUTRIN  XL) 150 MG 24 hr tablet Take 1 tablet (150 mg total) by mouth every morning for depression. 30 tablet 2   celecoxib  (CELEBREX ) 200 MG capsule Take 1 capsule (200 mg total) by mouth 2 (two) times daily. 180 capsule 1   CINNAMON PO Take 600 mg by mouth daily with supper.  clotrimazole -betamethasone  (LOTRISONE) cream Apply 1 Application topically daily as needed (Rash).     diclofenac  sodium (VOLTAREN ) 1 % GEL Apply 2-4 g topically 4 (four) times daily as needed (as directed for pain).     empagliflozin  (JARDIANCE ) 25 MG TABS tablet Take 1 tablet (25 mg total) by mouth daily. 90 tablet 1   escitalopram  (LEXAPRO ) 20 MG tablet Take 1 tablet (20 mg total) by mouth daily. 90 tablet 1   fenofibrate  (TRICOR ) 145 MG tablet Take 1 tablet (145 mg total) by mouth at bedtime. 90 tablet 3   Ferrous Sulfate (IRON PO) Take 1 tablet by mouth daily. alternates days:1 tablet one day and 2 tablets the next day     FIBER PO Take 1 tablet by  mouth daily.     gabapentin  (NEURONTIN ) 600 MG tablet Take 1 tablet (600 mg total) by mouth 3 (three) times daily. 90 tablet 0   glipiZIDE  (GLUCOTROL  XL) 10 MG 24 hr tablet Take 2 tablets (20 mg total) by mouth every morning. 90 tablet 1   glucose blood test strip Use to check blood sugar up to four times daily as directed 100 each 3   HYDROcodone -acetaminophen  (NORCO/VICODIN) 5-325 MG tablet Take 1 tablet by mouth every 6 (six) hours as needed. 30 tablet 0   Incontinence Supply Disposable (DEPEND ADJUSTABLE UNDERWEAR) MISC 1 Application by Does not apply route daily. 16 each 11   insulin  glargine (LANTUS  SOLOSTAR) 100 UNIT/ML Solostar Pen Start with injecting 4 units under the skin in the evenings. Increase by 2 units every 2 days until monring fasting glucose is to goal (140). Max units 60 per day. (Patient taking differently: 56 Units daily. Start with injecting 4 units under the skin in the evenings. Increase by 2 units every 2 days until monring fasting glucose is to goal (140). Max units 60 per day.) 15 mL 11   Insulin  Pen Needle (INSUPEN PEN NEEDLES) 32G X 4 MM MISC Use to inject insulin  as directed 100 each 3   ketoconazole  (NIZORAL ) 2 % cream Apply 1 Application topically daily. 60 g 0   Lancets MISC Use to check blood sugar up to four times daily as directed 100 each 3   levothyroxine  (SYNTHROID ) 125 MCG tablet Take 1 tablet (125 mcg total) by mouth daily before breakfast. 30 tablet 2   loratadine  (ALLERGY RELIEF) 10 MG tablet Take 1 tablet (10 mg total) by mouth every evening. 90 tablet 3   magnesium  gluconate (MAGONATE) 500 (27 Mg) MG TABS tablet Take 500 mg by mouth at bedtime.     Multiple Vitamins-Calcium  (ONE-A-DAY WOMENS PO) Take 1 tablet by mouth daily.     mupirocin  cream (BACTROBAN ) 2 % Apply 1 Application topically 3 (three) times daily. 15 g 0   nitroGLYCERIN  (NITROSTAT ) 0.4 MG SL tablet Dissolve 1 tablet under the tongue every 5 minutes as needed for chest pain. Max of 3 doses,  then 911. 75 tablet 2   omeprazole  (PRILOSEC) 40 MG capsule Take 1 capsule (40 mg total) by mouth every evening. 90 capsule 1   polyethylene glycol powder (MIRALAX ) 17 GM/SCOOP powder Take 17 g by mouth daily. 255 g 11   Potassium 99 MG TABS Take 99 mg by mouth at bedtime.     rOPINIRole  (REQUIP ) 1 MG tablet Take 1 tablet (1 mg total) by mouth at bedtime. 90 tablet 0   rosuvastatin  (CRESTOR ) 40 MG tablet Take 1 tablet (40 mg total) by mouth daily. 90 tablet 3   Semaglutide ,0.25  or 0.5MG /DOS, (OZEMPIC , 0.25 OR 0.5 MG/DOSE,) 2 MG/3ML SOPN Inject 0.25 mg into the skin once a week for 28 days, THEN 0.5 mg once a week for 28 days. (Patient taking differently: Inject 0.5 mg once a week ) 6 mL 0   Tiotropium Bromide-Olodaterol (STIOLTO RESPIMAT ) 2.5-2.5 MCG/ACT AERS Inhale 2 puffs into the lungs daily. 4 g 11   tiZANidine  (ZANAFLEX ) 2 MG tablet Take 1 tablet (2 mg total) by mouth 3 (three) times daily. 90 tablet 5   traMADol  (ULTRAM ) 50 MG tablet Take 1 tablet (50 mg total) by mouth every 8 (eight) hours as needed for moderate pain (pain score 4-6). 30 tablet 0   triamcinolone  (KENALOG ) 0.1 % paste Apply a small amount to affected area twice daily for 2 - 3 weeks. 5 g 1   TURMERIC PO Take 450 mg by mouth daily with supper.     No current facility-administered medications for this visit.    PHYSICAL EXAMINATION: ECOG PERFORMANCE STATUS: 1 - Symptomatic but completely ambulatory  Vitals:   09/22/23 1316  BP: 131/61  Pulse: 73  Resp: 17  Temp: 98 F (36.7 C)  SpO2: 95%   Filed Weights   09/22/23 1316  Weight: 185 lb 1.6 oz (84 kg)      LABORATORY DATA:  I have reviewed the data as listed    Latest Ref Rng & Units 08/01/2023    6:31 PM 08/01/2023    7:40 AM 06/20/2023    2:22 PM  CMP  Glucose 70 - 99 mg/dL  778  774   BUN 8 - 23 mg/dL  21  23   Creatinine 9.55 - 1.00 mg/dL 8.97  9.18  9.05   Sodium 135 - 145 mmol/L  135  137   Potassium 3.5 - 5.1 mmol/L  4.3  4.3   Chloride 98 - 111  mmol/L  101  100   CO2 22 - 32 mmol/L  21  30   Calcium  8.9 - 10.3 mg/dL  89.8  89.3   Total Protein 6.5 - 8.1 g/dL  6.6  7.0   Total Bilirubin 0.0 - 1.2 mg/dL  0.7  0.3   Alkaline Phos 38 - 126 U/L  50  48   AST 15 - 41 U/L  25  16   ALT 0 - 44 U/L  19  17     Lab Results  Component Value Date   WBC 20.9 (H) 08/01/2023   HGB 14.5 08/01/2023   HCT 45.0 08/01/2023   MCV 95.5 08/01/2023   PLT 302 08/01/2023   NEUTROABS 8.7 (H) 08/01/2023    ASSESSMENT & PLAN:  Malignant neoplasm of upper-outer quadrant of left breast in female, estrogen receptor positive (HCC) 08/30/2019: Clinical T1CN0 stage Ia grade 2 IDC ER/PR positive HER2 positive Ki-67 20% 2014: TAH/BSO for grade 3 stage I endometrial cancer status post vaginal brachytherapy 10/07/2019: Left lumpectomy: T1CN1 stage Ib grade 2 IDC with negative margins 11/24/2019-01/26/2020: Adjuvant chemo with carboplatin , gemcitabine , trastuzumab  every 3 weeks x 6 02/16/2020-November 2022: TDM 1 03/30/2020-05/12/2020: Adjuvant radiation   Current treatment: Anastrozole  started 11/16/2020 Anastrozole  toxicities: Tolerating it fairly well without any major side effects but denies any hot flashes. Osteopenia: Status post recent ankle fracture will order new bone density.   Muscle aches and pains and bone pain:  CT CAP 08/09/2021: No evidence of metastatic disease Bone scan 08/11/2021: Nonspecific uptake left humeral head recommended an x-ray of the region.      Diabetes: She  has lost significant amount of weight over the past several years.    Bleeding: Unclear if it is vaginal bleed or urinary bleed.  She had a previous hysterectomy.  I would like to check urine analysis today.  Breast cancer surveillance: mammograms 08/26/2022 at [facility] Benign Bone density 03/19/2021: T score -0.7: Normal.  I ordered a new bone density to be done at [facility].    Return to clinic in 1 year for follow-up ------------------------------------- Assessment and  Plan Assessment & Plan Estrogen receptor positive left breast cancer, status post treatment, on adjuvant anastrozole  Four years post-diagnosis, on adjuvant anastrozole  for three years without issues. Mammogram shows low breast density, reducing risk of missed lesions. Discussed Guardant blood test for circulating tumor DNA specific to breast cancer. - Continue anastrozole  1 mg oral daily. - Order [facility] blood test for circulating tumor DNA every six months. - Schedule mammogram as per routine surveillance.  Evaluation of postmenopausal vaginal bleeding Intermittent postmenopausal vaginal bleeding with one significant episode. History of total abdominal hysterectomy in 2015 for endometrial cancer. Differential includes vaginal atrophy or urinary source. Awaiting GYN evaluation. - Perform urinalysis to evaluate for urinary source of bleeding. - Follow up with GYN appointment on October 7th for further evaluation.  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 Under care of foot specialist. Recent antibiotic allergy noted. - Continue follow-up with foot specialist for management of ankle fracture and ulcer.  Osteopenia Osteopenia with last bone density test in 2021. Recent fracture raises concern for bone health. - Order bone density test at [facility].      No orders of the defined types were placed in this encounter.  The patient has a good understanding of the overall plan. she agrees with it. she will call with any problems that may develop before the next visit here. Total time spent: 30 mins including face to face time and time spent for planning, charting and co-ordination of care   Naomi MARLA Chad, MD 09/22/23

## 2023-09-22 NOTE — Assessment & Plan Note (Signed)
 08/30/2019: Clinical T1CN0 stage Ia grade 2 IDC ER/PR positive HER2 positive Ki-67 20% 2014: TAH/BSO for grade 3 stage I endometrial cancer status post vaginal brachytherapy 10/07/2019: Left lumpectomy: T1CN1 stage Ib grade 2 IDC with negative margins 11/24/2019-01/26/2020: Adjuvant chemo with carboplatin , gemcitabine , trastuzumab  every 3 weeks x 6 02/16/2020-November 2022: TDM 1 03/30/2020-05/12/2020: Adjuvant radiation   Current treatment: Anastrozole  started 11/16/2020 Anastrozole  toxicities: Tolerating it fairly well without any major side effects but denies any hot flashes.   Muscle aches and pains and bone pain:  CT CAP 08/09/2021: No evidence of metastatic disease Bone scan 08/11/2021: Nonspecific uptake left humeral head recommended an x-ray of the region.      Diabetes: She has lost significant amount of weight over the past several years.    Breast cancer surveillance: mammograms 08/26/[facility]: Benign Bone density 03/19/2021: T score -0.7: Normal Breast exam 09/22/2023: Benign   Hypercalcemia: Stopped calcium  supplementation. Return to clinic in 1 year for follow-up

## 2023-09-23 ENCOUNTER — Ambulatory Visit (INDEPENDENT_AMBULATORY_CARE_PROVIDER_SITE_OTHER): Admitting: Family

## 2023-09-23 ENCOUNTER — Encounter: Payer: Self-pay | Admitting: Family

## 2023-09-23 DIAGNOSIS — S82891G Other fracture of right lower leg, subsequent encounter for closed fracture with delayed healing: Secondary | ICD-10-CM

## 2023-09-23 NOTE — Progress Notes (Signed)
 Post-Op Visit Note   Patient: Felicia [name]           Date of Birth: May 18, 1950           MRN: [id number] Visit Date: 09/23/2023 PCP: Kathrene Mardy HERO, PA-C  Chief Complaint:  Chief Complaint  Patient presents with   Right Ankle - Routine Post Op    08/01/2023 ORIF right ankle     HPI:  HPI the patient is a 73 year old who is being status post open reduction internal fixation right ankle July 18 her incision has been slow to heal  Ortho Exam On examination right ankle of the distal aspect of her incision is covered with an eschar about 8 mm x 4 mm there is no surrounding erythema there is mild tenderness surrounding there is no fluctuance no drainage  Visit Diagnoses: No diagnosis found.  Plan: May continue with dry dressing changes has planned bone density scan.  Will follow-up with her in 3 weeks if this fails to heal as expected discussed return precautions. Follow-Up Instructions: Return in about 3 weeks (around 10/14/2023), or if symptoms worsen or fail to improve.   Imaging: No results found.  Orders:  No orders of the defined types were placed in this encounter.  No orders of the defined types were placed in this encounter.    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   Erosive osteoarthritis of multiple sites 09/26/2021   Frequent headaches 05/31/2021   Bilateral impacted cerumen 02/22/2021   Nasal septal perforation 02/22/2021   Postnasal drip 02/22/2021   Hoarseness 01/25/2021   Hyperglycemia due to type 2 diabetes mellitus (HCC) 01/18/2021   Mixed simple and mucopurulent chronic bronchitis (HCC) 12/27/2020    Centrilobular emphysema (HCC) 07/31/2020   Shortness of breath 07/31/2020   Tongue lesion 04/30/2020   Hypothyroid    Anxiety    Anxiety and depression    Port-A-Cath in place 11/24/2019   Hepatic steatosis 09/28/2019   Malignant neoplasm of upper-outer quadrant of left breast in female, estrogen receptor positive (HCC) 09/02/2019   Trigger index finger of right hand 05/05/2019   Primary osteoarthritis of first carpometacarpal joint of right hand 05/05/2019   Osteoarthritis of finger of right hand 05/05/2019   Endometrial cancer (HCC) 10/23/2018   Cirrhosis of liver without ascites (HCC) 10/23/2018   Diarrhea 10/23/2018   Chest pain 02/15/2018   Coronary artery disease involving native coronary artery of native heart without angina pectoris 11/18/2017   Hyperlipidemia LDL goal <70 11/18/2017   Diabetes mellitus treated with insulin  and oral medication (HCC) 11/18/2017   Dizziness 11/18/2017   Type 2 diabetes mellitus without complication, without long-term current use of insulin  (HCC) 11/18/2017   Past Medical History:  Diagnosis Date   Adenomatous colon polyp 2015   Anemia    Anxiety    Breast cancer (HCC) 09/2019   left breast IMC   CAD (coronary artery disease)    a. 3/2007s/p DES to the LAD ([place] ); b. 01/2017 MV: EF 80%, small, mild apical ant defect w/o ischemia (felt to be breast atten). Low risk.   Depression    DM (diabetes mellitus) (HCC)  Emphysema lung (HCC)    Essential hypertension    GERD (gastroesophageal reflux disease)    Headache    secondary to neck surgery per patient   High triglycerides    HTN (hypertension)    Hyperlipidemia    Hypothyroid    OA (osteoarthritis)    right knee,hands   Overflow incontinence    Pneumonia 2003   PONV (postoperative nausea and vomiting)    s/p gallbladder    RLS (restless legs syndrome)    Urinary urgency    Uterine cancer (HCC) dx'd 2014    Family History  Problem Relation Age of Onset   AAA  (abdominal aortic aneurysm) Mother    Thyroid  disease Mother    Colon cancer Mother    Diabetes Father    Alzheimer's disease Father    Arthritis Sister    Heart disease Sister    Throat cancer Sister    Kidney failure Sister    Diabetes Sister    Diabetes Brother    Diabetes Brother    Breast cancer Cousin     Past Surgical History:  Procedure Laterality Date   BREAST LUMPECTOMY WITH RADIOACTIVE SEED AND SENTINEL LYMPH NODE BIOPSY Left 10/07/2019   Procedure: LEFT BREAST LUMPECTOMY WITH RADIOACTIVE SEED AND SENTINEL LYMPH NODE BIOPSY;  Surgeon: Ebbie Cough, MD;  Location: [facility];  Service: General;  Laterality: Left;   CHOLECYSTECTOMY     COLONOSCOPY     CORONARY STENT PLACEMENT     CORONARY/GRAFT ACUTE MI REVASCULARIZATION     FINGER ARTHRODESIS Right 06/11/2019   Procedure: ARTHRODESIS INDEX FINGER DISTAL PHALANGEAL JOINT;  Surgeon: Murrell Drivers, MD;  Location: [facility];  Service: Orthopedics;  Laterality: Right;   LEFT HEART CATH AND CORONARY ANGIOGRAPHY N/A 02/16/2018   Procedure: LEFT HEART CATH AND CORONARY ANGIOGRAPHY;  Surgeon: Court Dorn PARAS, MD;  Location: [facility];  Service: Cardiovascular;  Laterality: N/A;   LEFT HEART CATH AND CORONARY ANGIOGRAPHY N/A 05/01/2020   Procedure: LEFT HEART CATH AND CORONARY ANGIOGRAPHY;  Surgeon: Court Dorn PARAS, MD;  Location: [facility];  Service: Cardiovascular;  Laterality: N/A;   ORIF ANKLE FRACTURE Right 08/01/2023   Procedure: OPEN REDUCTION INTERNAL FIXATION (ORIF) ANKLE FRACTURE, RIGHT;  Surgeon: Harden Jerona GAILS, MD;  Location: [facility];  Service: Orthopedics;  Laterality: Right;  RIGHT ORIF DISTAL FIBULA FRACTURE   PORTACATH PLACEMENT Right 11/23/2019   Procedure: INSERTION PORT-A-CATH WITH ULTRASOUND GUIDANCE;  Surgeon: Ebbie Cough, MD;  Location: [facility];  Service: General;  Laterality: Right;   POSTERIOR FUSION CERVICAL SPINE     C3-C7    REPLACEMENT TOTAL KNEE Right    SPINE SURGERY     TOTAL ABDOMINAL HYSTERECTOMY     FOR UTERUS CANCER   TOTAL HIP ARTHROPLASTY Left 2015   Social History   Occupational History   Occupation: retired  Tobacco Use   Smoking status: Never   Smokeless tobacco: Never  Vaping Use   Vaping status: Never Used  Substance and Sexual Activity   Alcohol use: Not Currently    Comment: OCCASIONALLY   Drug use: No   Sexual activity: Not Currently    Birth control/protection: Surgical    Comment: hyst

## 2023-09-24 ENCOUNTER — Other Ambulatory Visit: Payer: Self-pay

## 2023-09-25 ENCOUNTER — Ambulatory Visit (INDEPENDENT_AMBULATORY_CARE_PROVIDER_SITE_OTHER): Admitting: Physician Assistant

## 2023-09-25 ENCOUNTER — Other Ambulatory Visit: Payer: Self-pay

## 2023-09-25 ENCOUNTER — Other Ambulatory Visit (HOSPITAL_COMMUNITY): Payer: Self-pay

## 2023-09-25 VITALS — BP 98/58 | HR 70 | Temp 97.3°F | Ht 64.0 in | Wt 181.4 lb

## 2023-09-25 DIAGNOSIS — G8929 Other chronic pain: Secondary | ICD-10-CM | POA: Diagnosis not present

## 2023-09-25 DIAGNOSIS — E039 Hypothyroidism, unspecified: Secondary | ICD-10-CM

## 2023-09-25 DIAGNOSIS — N939 Abnormal uterine and vaginal bleeding, unspecified: Secondary | ICD-10-CM | POA: Diagnosis not present

## 2023-09-25 DIAGNOSIS — F32A Depression, unspecified: Secondary | ICD-10-CM

## 2023-09-25 DIAGNOSIS — E1165 Type 2 diabetes mellitus with hyperglycemia: Secondary | ICD-10-CM | POA: Diagnosis not present

## 2023-09-25 DIAGNOSIS — Z170 Estrogen receptor positive status [ER+]: Secondary | ICD-10-CM | POA: Diagnosis not present

## 2023-09-25 DIAGNOSIS — F419 Anxiety disorder, unspecified: Secondary | ICD-10-CM | POA: Diagnosis not present

## 2023-09-25 DIAGNOSIS — R519 Headache, unspecified: Secondary | ICD-10-CM | POA: Diagnosis not present

## 2023-09-25 DIAGNOSIS — Z794 Long term (current) use of insulin: Secondary | ICD-10-CM | POA: Diagnosis not present

## 2023-09-25 DIAGNOSIS — C50412 Malignant neoplasm of upper-outer quadrant of left female breast: Secondary | ICD-10-CM | POA: Diagnosis not present

## 2023-09-25 DIAGNOSIS — S91001S Unspecified open wound, right ankle, sequela: Secondary | ICD-10-CM

## 2023-09-25 MED ORDER — EMPAGLIFLOZIN 25 MG PO TABS
25.0000 mg | ORAL_TABLET | Freq: Every day | ORAL | 1 refills | Status: AC
  Filled 2023-09-25: qty 90, 90d supply, fill #0
  Filled 2023-12-23: qty 90, 90d supply, fill #1

## 2023-09-25 NOTE — Patient Instructions (Signed)
 Always good to see you.  Keep your follow-up appointments as scheduled.  Continue to monitor your blood pressure and glucose readings at home.  Labs today - will MyChart with results.

## 2023-09-25 NOTE — Progress Notes (Unsigned)
 Patient ID: Felicia [name], female    DOB: 07/15/1950, 73 y.o.   MRN: [id number]   Assessment & Plan:  Type 2 diabetes mellitus with hyperglycemia, with long-term current use of insulin  (HCC) -     Hemoglobin A1c  Hypothyroidism, unspecified type -     TSH  Vagina bleeding  Wound of right ankle, sequela  Anxiety and depression  Other orders -     Empagliflozin ; Take 1 tablet (25 mg total) by mouth daily.  Dispense: 90 tablet; Refill: 1      Assessment and Plan Assessment & Plan Chronic occipital neuralgia with headache Chronic occipital neuralgia with associated headaches, particularly at the base of the neck. Recent exacerbation with severe pain, possibly stress-related. Considering dehydration as a contributing factor. - Encourage hydration - Consider heat therapy and massage - Evaluate for potential trigger point therapy or dry needling if symptoms persist  Type 2 diabetes mellitus with hyperglycemia Type 2 diabetes managed with Jardiance , glipizide , insulin , and Ozempic . Blood glucose levels show some variability, but overall management appears well-controlled. - Order A1c test - Continue current diabetes medication regimen - Follow up with endocrinologist at the end of the month Lab Results  Component Value Date   HGBA1C 9.2 (H) 09/25/2023   HGBA1C 10.0 (A) 06/20/2023   HGBA1C 9.2 (A) 03/18/2023     Hypothyroidism Hypothyroidism managed with Synthroid  125 mcg daily. - Order TSH test  Breast cancer Breast cancer with ongoing monitoring. Recent discussion of [facility] blood test for circulating tumor DNA as a new monitoring tool. - Continue anastrozole  1 mg daily - Order [facility] blood test for circulating tumor DNA every six months - this is completed and followed with her oncologist  Abnormal uterine and vaginal bleeding, unspecified No current vaginal bleeding. Previous bleeding possibly due to skin thinning. Recent urine test showed no  blood, only expected sugar due to diabetes. She has appt upcoming with GYN.  Anxiety Anxiety exacerbated by current living situation and stressors. Discussion about potential housing issues and stress-related headache. - Encourage discussion with daughter about housing options - Consider stress management techniques      Return in about 3 months (around 12/25/2023) for recheck/follow-up.    Subjective:    Chief Complaint  Patient presents with   Medical Management of Chronic Issues    Pt in office for 2 wk follow up; pt requesting refills be sent to the pharmacy on Gabapentin  600mg  and also Ropinirole  1mg ; pt brought in her fasting glucose; pt not in a good place today pt upset that her landlord is wanting to kick her out due to having to much stuff in her apt and this is upsetting to her   Diabetes    Pt due for diabetes follow up and A1C and pt requesting labs for thyroid  and A1C anything else needing to be checked    HPI Discussed the use of AI scribe software for clinical note transcription with the patient, who gave verbal consent to proceed.  History of Present Illness Felicia [name] is a 73 year old female with chronic HA presenting for f/up visit.  She has been experiencing a severe headache, particularly at the base of her neck, for the past few days. The pain is exacerbated by movement, especially when turning her head to the right. She has a history of occipital neuralgia and has been using Tylenol  and tramadol  for pain relief, along with a gun massager and heat therapy, but the headache persists.  Her  blood pressure is low today. No dizziness, imbalance, weakness, or numbness. She is currently taking bisoprolol  5 mg at bedtime.  She manages her type 2 diabetes with Jardiance , glipizide , insulin  (58 units at night), and Ozempic  0.5 mg once a week. Her fasting blood sugar levels fluctuate based on her diet, with a recent reading of 170 mg/dL, which decreased to 833 mg/dL  after medication.  She has a history of hypothyroidism and is taking Synthroid  125 mcg daily. She is experiencing significant stress due to a notice from her landlord to rectify issues in her apartment or face eviction, which she believes may be contributing to her headache.  She has a history of breast cancer and is currently being monitored for recurrence. She recently underwent a bone density test and a urine test, which showed sugar but no blood. No vaginal bleeding. She is also being monitored for lymphedema, with recent measurements indicating an increase, though still within acceptable limits.     Past Medical History:  Diagnosis Date   Adenomatous colon polyp 2015   Anemia    Anxiety    Breast cancer (HCC) 09/2019   left breast IMC   CAD (coronary artery disease)    a. 3/2007s/p DES to the LAD ([place] ); b. 01/2017 MV: EF 80%, small, mild apical ant defect w/o ischemia (felt to be breast atten). Low risk.   Depression    DM (diabetes mellitus) (HCC)    Emphysema lung (HCC)    Essential hypertension    GERD (gastroesophageal reflux disease)    Headache    secondary to neck surgery per patient   High triglycerides    HTN (hypertension)    Hyperlipidemia    Hypothyroid    OA (osteoarthritis)    right knee,hands   Overflow incontinence    Pneumonia 2003   PONV (postoperative nausea and vomiting)    s/p gallbladder    RLS (restless legs syndrome)    Urinary urgency    Uterine cancer (HCC) dx'd 2014    Past Surgical History:  Procedure Laterality Date   BREAST LUMPECTOMY WITH RADIOACTIVE SEED AND SENTINEL LYMPH NODE BIOPSY Left 10/07/2019   Procedure: LEFT BREAST LUMPECTOMY WITH RADIOACTIVE SEED AND SENTINEL LYMPH NODE BIOPSY;  Surgeon: Ebbie Cough, MD;  Location: [facility];  Service: General;  Laterality: Left;   CHOLECYSTECTOMY     COLONOSCOPY     CORONARY STENT PLACEMENT     CORONARY/GRAFT ACUTE MI REVASCULARIZATION     FINGER  ARTHRODESIS Right 06/11/2019   Procedure: ARTHRODESIS INDEX FINGER DISTAL PHALANGEAL JOINT;  Surgeon: Murrell Drivers, MD;  Location: [facility];  Service: Orthopedics;  Laterality: Right;   LEFT HEART CATH AND CORONARY ANGIOGRAPHY N/A 02/16/2018   Procedure: LEFT HEART CATH AND CORONARY ANGIOGRAPHY;  Surgeon: Court Dorn PARAS, MD;  Location: [facility];  Service: Cardiovascular;  Laterality: N/A;   LEFT HEART CATH AND CORONARY ANGIOGRAPHY N/A 05/01/2020   Procedure: LEFT HEART CATH AND CORONARY ANGIOGRAPHY;  Surgeon: Court Dorn PARAS, MD;  Location: [facility];  Service: Cardiovascular;  Laterality: N/A;   ORIF ANKLE FRACTURE Right 08/01/2023   Procedure: OPEN REDUCTION INTERNAL FIXATION (ORIF) ANKLE FRACTURE, RIGHT;  Surgeon: Harden Jerona GAILS, MD;  Location: [facility];  Service: Orthopedics;  Laterality: Right;  RIGHT ORIF DISTAL FIBULA FRACTURE   PORTACATH PLACEMENT Right 11/23/2019   Procedure: INSERTION PORT-A-CATH WITH ULTRASOUND GUIDANCE;  Surgeon: Ebbie Cough, MD;  Location: [facility];  Service: General;  Laterality: Right;   POSTERIOR FUSION CERVICAL SPINE     C3-C7   REPLACEMENT TOTAL KNEE Right    SPINE SURGERY     TOTAL ABDOMINAL HYSTERECTOMY     FOR UTERUS CANCER   TOTAL HIP ARTHROPLASTY Left 2015    Family History  Problem Relation Age of Onset   AAA (abdominal aortic aneurysm) Mother    Thyroid  disease Mother    Colon cancer Mother    Diabetes Father    Alzheimer's disease Father    Arthritis Sister    Heart disease Sister    Throat cancer Sister    Kidney failure Sister    Diabetes Sister    Diabetes Brother    Diabetes Brother    Breast cancer Cousin     Social History   Tobacco Use   Smoking status: Never   Smokeless tobacco: Never  Vaping Use   Vaping status: Never Used  Substance Use Topics   Alcohol use: Not Currently    Comment: OCCASIONALLY   Drug use: No     Allergies  Allergen Reactions    Chloraprep One Step [Chlorhexidine  Gluconate] Rash   Estrogens Other (See Comments)    PATIENT HAS A HISTORY OF CANCER AND HAS BEEN TOLD TO NEVER TAKE ANYTHING CONTAINING ESTROGEN, AS IT MIGHT CAUSE A RECURRENCE  Other Reaction(s): bad for cancer    Metformin  And Related Diarrhea   Other Dermatitis   Shellfish-Derived Products Nausea And Vomiting   Shrimp [Shellfish Allergy] Nausea And Vomiting   Doxycycline  Itching and Rash    Review of Systems NEGATIVE UNLESS OTHERWISE INDICATED IN HPI      Objective:     BP (!) 98/58 (BP Location: Left Arm, Patient Position: Sitting, Cuff Size: Normal)   Pulse 70   Temp (!) 97.3 F (36.3 C) (Temporal)   Ht 5' 4 (1.626 m)   Wt 181 lb 6.4 oz (82.3 kg)   SpO2 99%   BMI 31.14 kg/m   Wt Readings from Last 3 Encounters:  09/25/23 181 lb 6.4 oz (82.3 kg)  09/22/23 185 lb 1.6 oz (84 kg)  09/04/23 184 lb (83.5 kg)    BP Readings from Last 3 Encounters:  09/25/23 (!) 98/58  09/22/23 131/61  09/04/23 122/78     Physical Exam Vitals and nursing note reviewed.  Constitutional:      General: She is not in acute distress.    Appearance: Normal appearance. She is not ill-appearing.  HENT:     Head: Normocephalic.     Right Ear: External ear normal.     Left Ear: External ear normal.     Nose: No congestion.     Mouth/Throat:     Mouth: Mucous membranes are moist.     Pharynx: No oropharyngeal exudate or posterior oropharyngeal erythema.  Eyes:     Extraocular Movements: Extraocular movements intact.     Conjunctiva/sclera: Conjunctivae normal.     Pupils: Pupils are equal, round, and reactive to light.  Cardiovascular:     Rate and Rhythm: Normal rate and regular rhythm.     Pulses: Normal pulses. No decreased pulses.     Heart sounds: Normal heart sounds. No murmur heard. Pulmonary:     Effort: Pulmonary effort is normal. No respiratory distress.     Breath sounds: Normal breath sounds. No wheezing.  Musculoskeletal:      Cervical back: Normal range of motion.     Right lower leg: No edema.     Left lower  leg: No edema.     Comments: Atrophy muscles noted bilateral lower legs   Skin:    General: Skin is warm.     Findings: Lesion (dorsum left foot small superficial wound noted without erythema or edema) present.  Neurological:     General: No focal deficit present.     Mental Status: She is alert and oriented to person, place, and time.     Cranial Nerves: No cranial nerve deficit.     Sensory: No sensory deficit.     Motor: No weakness.     Gait: Gait normal.  Psychiatric:        Mood and Affect: Mood normal.        Behavior: Behavior normal.          Time Spent: 45 minutes of total time was spent on the date of the encounter performing the following actions: chart review prior to seeing the patient, obtaining history, performing a medically necessary exam, counseling on the treatment plan, placing orders, and documenting in our EHR.     Ovetta Bazzano M Sherle Mello, PA-C

## 2023-09-26 ENCOUNTER — Telehealth: Payer: Self-pay

## 2023-09-26 ENCOUNTER — Ambulatory Visit: Payer: Self-pay | Admitting: Physician Assistant

## 2023-09-26 LAB — TSH: TSH: 4.34 u[IU]/mL (ref 0.35–5.50)

## 2023-09-26 LAB — HEMOGLOBIN A1C: Hgb A1c MFr Bld: 9.2 % — ABNORMAL HIGH (ref 4.6–6.5)

## 2023-09-26 NOTE — Progress Notes (Signed)
 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  Otherwise, consider referral for trigger point injections. Stop tramadol  Start nortriptyline  10mg  at bedtime.  We can increase to 25mg  at bedtime in 6 weeks I needed. Tizanidine  to 2mg  three times daily Gabapentin  600mg  three times daily Check for MuSK antibodies Follow up in 6 months.       Subjective:  Addison Freimuth is a 73 year old female with CAD, HTN, HLD, DM II, hypothyroidism, osteoarthritis, depression, anxiety and history of breast cancer and uterine cancer who follows up for headache.  UPDATE: Increased tizanidine  and maintained on gabapentin . Headaches are worse.  Headaches are daily.  Takes tramadol  and Tylenol  daily for the headaches.    Myasthenia gravis panel 2 from 03/24/2023 was normal but MuSK reflex was not performed.      Current NSAIDS/analgesics:  ASA 81mg  daily, celecoxib , tramadol , Tylenol  Current triptans:  none Current ergotamine:  none Current anti-emetic:  Compazine  Current muscle relaxants:  tizanidine  2mg  TID Current Antihypertensive medications:  none Current Antidepressant medications:  escitalopram  20mg  Current Anticonvulsant medications:  gabapentin  600mg  three times daily Current anti-CGRP:  none Current Antihistamines/Decongestants:  Claritin  Other therapy:  none Other medications:  ropinirole  0.5mg  QHS     Caffeine:  no coffee.  Diet cola.  Decaf tea   HISTORY: She has had occasional sharp right sided paroxysmal headaches since childhood.  In 2017, she started developing occipital headaches with neck pain, left arm pain and balance problems.   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  Continues to have chronic neck pain and headache.  Neck pain radiates down into both shoulders.  Headache is often diffuse and throbbing but still has occasional sharp right sided headache.  Also may have left eye pain that radiates to top of head.  Sometimes phonophobia but usually not.  No nausea, vomiting, photophobia or visual disturbance.  Always with a persistent dull headache but has exacerbations everyday lasting a few hours.  Takes Tylenol  or another headache medication daily.  Cannot get injections due to hardware in the cervical spine.  She also has left sided jaw pain with prior diagnosis of TMJ dysfunction but CT sinuses from August 2024 did not reveal TMJ arthropathy.  She also notes that she sometimes has left sided periorbital headache as well.  She also notes a little eyelid lag in the left eye as well but no lacrimation or conjunctival injection.  It happens often when she is focused on something such as watching TV or looking on her phone.  May happen in the morning but mostly notices it at night.  Sed rate from 01/31/2023 was 24.  Labs from 03/18/2023 include TSH 3.19, free T3 2.6, free T4 1, and Hgb A1c 9.2.     MRI of brain with and without contrast on 02/14/2021 personally reviewed showed mild chronic small vessel ischemic changes within the cerebral white matter but overall unremarkable.     Past NSAIDS/analgesics:  none Past abortive triptans:  none Past abortive ergotamine:  none Past muscle relaxants:  tizanidine  Past anti-emetic:  Zofran  8mg  Past antihypertensive medications:  none Past antidepressant medications:  amitriptyline (helpful) Past  anticonvulsant medications:  none Past anti-CGRP:  none Past vitamins/Herbal/Supplements:  none Past antihistamines/decongestants:  none Other past therapies:  physical therapy for neck.      Family history of headache:   unknown.      PAST MEDICAL HISTORY: Past Medical History:  Diagnosis Date   Adenomatous colon polyp 2015   Anemia    Anxiety    Breast cancer (HCC) 09/2019   left breast IMC   CAD (coronary artery disease)    a. 3/2007s/p DES to the LAD ([place] ); b. 01/2017 MV: EF 80%, small, mild apical ant defect w/o ischemia (felt to be breast atten). Low risk.   Depression    DM (diabetes mellitus) (HCC)    Emphysema lung (HCC)    Essential hypertension    GERD (gastroesophageal reflux disease)    Headache    secondary to neck surgery per patient   High triglycerides    HTN (hypertension)    Hyperlipidemia    Hypothyroid    OA (osteoarthritis)    right knee,hands   Overflow incontinence    Pneumonia 2003   PONV (postoperative nausea and vomiting)    s/p gallbladder    RLS (restless legs syndrome)    Urinary urgency    Uterine cancer (HCC) dx'd 2014    MEDICATIONS: Current Outpatient Medications on File Prior to Visit  Medication Sig Dispense Refill   ACCU-CHEK GUIDE test strip USE TO check blood glucose UP TO four times daily AS DIRECTED 100 strip 0   Accu-Chek Softclix Lancets lancets USE TO check blood glucose UP TO four times daily AS DIRECTED 100 each 0   acetaminophen  (TYLENOL ) 500 MG tablet Take 500 mg by mouth every 6 (six) hours as needed for mild pain (pain score 1-3) or moderate pain (pain score 4-6).     albuterol  (VENTOLIN  HFA) 108 (90 Base) MCG/ACT inhaler Inhale 2 puffs into the lungs every 6 (six) hours as needed for wheezing or shortness of breath. 6.7 g 2   anastrozole  (ARIMIDEX ) 1 MG tablet Take 1 tablet (1 mg total) by mouth daily. 90 tablet 4   ascorbic acid (VITAMIN C) 500 MG tablet Take 500 mg by mouth daily.     aspirin  EC (ASPIRIN  81) 81 MG tablet Take 1 tablet (81 mg total) by mouth in the morning. 90 tablet 99   azelastine  (ASTELIN ) 0.1 % nasal spray Place 2 sprays into both nostrils 2 (two) times daily. Use in each nostril as directed 30 mL 11    bisoprolol  (ZEBETA ) 5 MG tablet Take 1 tablet (5 mg total) by mouth at bedtime. 90 tablet 3   blood glucose meter kit and supplies Dispense based on patient and insurance preference. Use up to four times daily as directed. (FOR ICD-10 E10.9, E11.9). 1 each 0   buPROPion  (WELLBUTRIN  XL) 150 MG 24 hr tablet Take 1 tablet (150 mg total) by mouth every morning for depression. 30 tablet 2   celecoxib  (CELEBREX ) 200 MG capsule Take 1 capsule (200 mg total) by mouth 2 (two) times daily. 180 capsule 1   CINNAMON PO Take 600 mg by mouth daily with supper.     clotrimazole -betamethasone  (LOTRISONE) cream Apply 1 Application topically daily as needed (Rash).     diclofenac  sodium (VOLTAREN ) 1 % GEL Apply 2-4 g topically 4 (four) times daily as needed (as directed for pain).     empagliflozin  (JARDIANCE ) 25 MG TABS tablet Take 1 tablet (25 mg total) by mouth daily. 90 tablet 1  escitalopram  (LEXAPRO ) 20 MG tablet Take 1 tablet (20 mg total) by mouth daily. 90 tablet 1   fenofibrate  (TRICOR ) 145 MG tablet Take 1 tablet (145 mg total) by mouth at bedtime. 90 tablet 3   Ferrous Sulfate (IRON PO) Take 1 tablet by mouth daily. alternates days:1 tablet one day and 2 tablets the next day     FIBER PO Take 1 tablet by mouth daily.     gabapentin  (NEURONTIN ) 600 MG tablet Take 1 tablet (600 mg total) by mouth 3 (three) times daily. 90 tablet 0   glipiZIDE  (GLUCOTROL  XL) 10 MG 24 hr tablet Take 2 tablets (20 mg total) by mouth every morning. 90 tablet 1   glucose blood test strip Use to check blood sugar up to four times daily as directed 100 each 3   HYDROcodone -acetaminophen  (NORCO/VICODIN) 5-325 MG tablet Take 1 tablet by mouth every 6 (six) hours as needed. 30 tablet 0   Incontinence Supply Disposable (DEPEND ADJUSTABLE UNDERWEAR) MISC 1 Application by Does not apply route daily. 16 each 11   insulin  glargine (LANTUS  SOLOSTAR) 100 UNIT/ML Solostar Pen Start with injecting 4 units under the skin in the evenings.  Increase by 2 units every 2 days until monring fasting glucose is to goal (140). Max units 60 per day. (Patient taking differently: 56 Units daily. Start with injecting 4 units under the skin in the evenings. Increase by 2 units every 2 days until monring fasting glucose is to goal (140). Max units 60 per day.) 15 mL 11   Insulin  Pen Needle (INSUPEN PEN NEEDLES) 32G X 4 MM MISC Use to inject insulin  as directed 100 each 3   ketoconazole  (NIZORAL ) 2 % cream Apply 1 Application topically daily. 60 g 0   Lancets MISC Use to check blood sugar up to four times daily as directed 100 each 3   levothyroxine  (SYNTHROID ) 125 MCG tablet Take 1 tablet (125 mcg total) by mouth daily before breakfast. 30 tablet 2   loratadine  (ALLERGY RELIEF) 10 MG tablet Take 1 tablet (10 mg total) by mouth every evening. 90 tablet 3   magnesium  gluconate (MAGONATE) 500 (27 Mg) MG TABS tablet Take 500 mg by mouth at bedtime.     Multiple Vitamins-Calcium  (ONE-A-DAY WOMENS PO) Take 1 tablet by mouth daily.     mupirocin  cream (BACTROBAN ) 2 % Apply 1 Application topically 3 (three) times daily. 15 g 0   nitroGLYCERIN  (NITROSTAT ) 0.4 MG SL tablet Dissolve 1 tablet under the tongue every 5 minutes as needed for chest pain. Max of 3 doses, then 911. 75 tablet 2   omeprazole  (PRILOSEC) 40 MG capsule Take 1 capsule (40 mg total) by mouth every evening. 90 capsule 1   polyethylene glycol powder (MIRALAX ) 17 GM/SCOOP powder Take 17 g by mouth daily. 255 g 11   Potassium 99 MG TABS Take 99 mg by mouth at bedtime.     rOPINIRole  (REQUIP ) 1 MG tablet Take 1 tablet (1 mg total) by mouth at bedtime. 90 tablet 0   rosuvastatin  (CRESTOR ) 40 MG tablet Take 1 tablet (40 mg total) by mouth daily. 90 tablet 3   Semaglutide ,0.25 or 0.5MG /DOS, (OZEMPIC , 0.25 OR 0.5 MG/DOSE,) 2 MG/3ML SOPN Inject 0.25 mg into the skin once a week for 28 days, THEN 0.5 mg once a week for 28 days. (Patient taking differently: Inject 0.5 mg once a week ) 6 mL 0    sulfamethoxazole -trimethoprim  (BACTRIM  DS) 800-160 MG tablet Take 1 tablet by mouth 2 (two)  times daily. 14 tablet 0   Tiotropium Bromide-Olodaterol (STIOLTO RESPIMAT ) 2.5-2.5 MCG/ACT AERS Inhale 2 puffs into the lungs daily. 4 g 11   tiZANidine  (ZANAFLEX ) 2 MG tablet Take 1 tablet (2 mg total) by mouth 3 (three) times daily. 90 tablet 5   traMADol  (ULTRAM ) 50 MG tablet Take 1 tablet (50 mg total) by mouth every 8 (eight) hours as needed for moderate pain (pain score 4-6). 30 tablet 0   triamcinolone  (KENALOG ) 0.1 % paste Apply a small amount to affected area twice daily for 2 - 3 weeks. 5 g 1   TURMERIC PO Take 450 mg by mouth daily with supper.     No current facility-administered medications on file prior to visit.    ALLERGIES: Allergies  Allergen Reactions   Chloraprep One Step [Chlorhexidine  Gluconate] Rash   Estrogens Other (See Comments)    PATIENT HAS A HISTORY OF CANCER AND HAS BEEN TOLD TO NEVER TAKE ANYTHING CONTAINING ESTROGEN, AS IT MIGHT CAUSE A RECURRENCE  Other Reaction(s): bad for cancer    Metformin  And Related Diarrhea   Other Dermatitis   Shellfish-Derived Products Nausea And Vomiting   Shrimp [Shellfish Allergy] Nausea And Vomiting   Doxycycline  Itching and Rash    FAMILY HISTORY: Family History  Problem Relation Age of Onset   AAA (abdominal aortic aneurysm) Mother    Thyroid  disease Mother    Colon cancer Mother    Diabetes Father    Alzheimer's disease Father    Arthritis Sister    Heart disease Sister    Throat cancer Sister    Kidney failure Sister    Diabetes Sister    Diabetes Brother    Diabetes Brother    Breast cancer Cousin       Objective:  Blood pressure 93/61, resp. rate 20, height 5' 4 (1.626 m), weight 185 lb (83.9 kg). General: No acute distress.  Head:  Normocephalic/atraumatic Neck:  Supple.  No paraspinal tenderness.  Full range of motion. Heart:  Regular rate and rhythm. Neuro:  Alert and oriented.  Speech fluent and not  dysarthric.  Language intact.  Left ptosis.  Otherwise, CN II-XII intact.  Bulk and tone normal.  Muscle strength 5/5 throughout.  Sensation to light touch intact.  Deep tendon reflexes 2+ throughout, toes downgoing.  Gait normal.  Romberg negative.      Juliene Dunnings, DO  CC: [name], PA-C

## 2023-09-26 NOTE — Telephone Encounter (Signed)
 Per md orders entered for [employer] and all supported documents faxed to [phone]. Faxed confirmation was received.

## 2023-09-29 ENCOUNTER — Encounter: Payer: Self-pay | Admitting: Neurology

## 2023-09-29 ENCOUNTER — Other Ambulatory Visit

## 2023-09-29 ENCOUNTER — Other Ambulatory Visit (HOSPITAL_COMMUNITY): Payer: Self-pay

## 2023-09-29 ENCOUNTER — Ambulatory Visit (INDEPENDENT_AMBULATORY_CARE_PROVIDER_SITE_OTHER): Admitting: Neurology

## 2023-09-29 ENCOUNTER — Encounter: Payer: Self-pay | Admitting: Oncology

## 2023-09-29 VITALS — BP 93/61 | Resp 20 | Ht 64.0 in | Wt 185.0 lb

## 2023-09-29 DIAGNOSIS — H02402 Unspecified ptosis of left eyelid: Secondary | ICD-10-CM | POA: Diagnosis not present

## 2023-09-29 DIAGNOSIS — G4486 Cervicogenic headache: Secondary | ICD-10-CM | POA: Diagnosis not present

## 2023-09-29 MED ORDER — NORTRIPTYLINE HCL 10 MG PO CAPS
10.0000 mg | ORAL_CAPSULE | Freq: Every day | ORAL | 5 refills | Status: AC
  Filled 2023-09-29: qty 30, 30d supply, fill #0
  Filled 2023-10-23: qty 30, 30d supply, fill #1
  Filled 2023-10-31 – 2023-11-22 (×3): qty 30, 30d supply, fill #2
  Filled 2023-12-22: qty 30, 30d supply, fill #3
  Filled 2024-01-17: qty 30, 30d supply, fill #4
  Filled 2024-02-16: qty 30, 30d supply, fill #5

## 2023-09-29 NOTE — Patient Instructions (Signed)
 Check anti-MuSK antibodies Check MRI of cervical spine without contrast Start nortriptyline  10mg  at bedtime.  If no improvement in 6 weeks, contact me Stop tramadol .  May use Tylenol  but Limit use of pain relievers to no more than 9 days out of the month to prevent risk of rebound or medication-overuse headache. Tizanidine  2mg  three times daily as needed. Follow up 6 month.s

## 2023-09-30 ENCOUNTER — Encounter: Payer: Self-pay | Admitting: Physician Assistant

## 2023-09-30 ENCOUNTER — Telehealth: Payer: Self-pay | Admitting: *Deleted

## 2023-09-30 NOTE — Progress Notes (Signed)
 Complex Care Management Note Care Guide Note  09/30/2023 Name: Felicia [name] MRN: [id number] DOB: 07-23-1950  Felicia [name] is a 73 y.o. year old female who is a primary care patient of Allwardt, Alyssa M, PA-C . The community resource team was consulted for assistance with Transportation Needs   SDOH screenings and interventions completed:  Yes     SDOH Interventions Today    Flowsheet Row Most Recent Value  SDOH Interventions   Transportation Interventions Community Resources Provided  [employer] application for TAMS offered online assitance patient requested paper application mailed]  Social Connections Interventions Community Resources Provided  [facility] newsletter for senior resources]     Care guide performed the following interventions: Patient provided with information about care guide support team and interviewed to confirm resource needs.  Follow Up Plan:  No further follow up planned at this time. The patient has been provided with needed resources.  Encounter Outcome:  Patient Visit Completed Felicia [name]  [facility] HealthPopulation Health Care Guide  Direct Dial:[phone] Fax:[phone] Website: [email or web address]

## 2023-09-30 NOTE — Telephone Encounter (Signed)
 Please see pt msg and advise

## 2023-09-30 NOTE — Progress Notes (Signed)
 Complex Care Management Note Care Guide Note  09/30/2023 Name: Felicia [name] MRN: [id number] DOB: 11-07-1950   Complex Care Management Outreach Attempts: An unsuccessful telephone outreach was attempted today to offer the patient information about available complex care management services.  Follow Up Plan:  Additional outreach attempts will be made to offer the patient complex care management information and services.   Encounter Outcome:  No Answer  [facility] HealthPopulation Health Care Guide  Direct [phone] [phone] Website: [email or web address]

## 2023-10-01 ENCOUNTER — Other Ambulatory Visit (HOSPITAL_COMMUNITY): Payer: Self-pay

## 2023-10-01 ENCOUNTER — Other Ambulatory Visit: Payer: Self-pay | Admitting: Neurology

## 2023-10-03 ENCOUNTER — Other Ambulatory Visit (HOSPITAL_COMMUNITY): Payer: Self-pay

## 2023-10-03 MED ORDER — GABAPENTIN 600 MG PO TABS
600.0000 mg | ORAL_TABLET | Freq: Three times a day (TID) | ORAL | 5 refills | Status: AC
  Filled 2023-10-03: qty 90, 30d supply, fill #0
  Filled 2023-10-27: qty 90, 30d supply, fill #1
  Filled 2023-11-25: qty 90, 30d supply, fill #2
  Filled 2023-12-25: qty 90, 30d supply, fill #3
  Filled 2024-01-18: qty 90, 30d supply, fill #4
  Filled 2024-02-16: qty 90, 30d supply, fill #5

## 2023-10-06 ENCOUNTER — Other Ambulatory Visit: Payer: Self-pay

## 2023-10-07 LAB — MUSK ANTIBODIES

## 2023-10-07 LAB — MYASTHENIA GRAVIS PANEL 2
A CHR BINDING ABS: <0.3 nmol/L
ACHR Blocking Abs: <15 %{inhibition} (ref <15)
Acetylchol Modul Ab: 23 %{inhibition}

## 2023-10-08 ENCOUNTER — Ambulatory Visit: Payer: Self-pay | Admitting: Neurology

## 2023-10-08 NOTE — Progress Notes (Signed)
 Patient advised.

## 2023-10-09 ENCOUNTER — Other Ambulatory Visit (HOSPITAL_COMMUNITY): Payer: Self-pay

## 2023-10-09 ENCOUNTER — Other Ambulatory Visit: Payer: Self-pay

## 2023-10-09 ENCOUNTER — Other Ambulatory Visit: Payer: Self-pay | Admitting: Pharmacist

## 2023-10-09 ENCOUNTER — Other Ambulatory Visit: Payer: Self-pay | Admitting: Physician Assistant

## 2023-10-09 ENCOUNTER — Encounter: Payer: Self-pay | Admitting: Pharmacist

## 2023-10-09 MED ORDER — GLIPIZIDE ER 10 MG PO TB24
20.0000 mg | ORAL_TABLET | Freq: Every morning | ORAL | 1 refills | Status: AC
Start: 2023-10-09 — End: ?
  Filled 2023-10-09: qty 90, 45d supply, fill #0
  Filled 2023-11-21: qty 90, 45d supply, fill #1

## 2023-10-09 NOTE — Progress Notes (Signed)
 10/09/23 Name: Felicia [name] MRN: [id number] DOB: 14-Aug-1950  Chief Complaint  Patient presents with   Diabetes   Medication Management    Nalda Shackleford is a 73 y.o. year old female who presented for a telephone visit.   They were referred to the pharmacist by their PCP for assistance in managing diabetes, complex medication management, and frequent falls.   Subjective:  Care Team: Primary Care Provider: Allwardt, Mardy HERO, PA-C ; Next Scheduled Visit: 12/25/2023 Endocrinologist Dr Mercie; Next Scheduled Visit: 10/14/2023 - initial visit Neurologist: Skeet; Next Scheduled Visit: - recent visit 09/29/2023  Cardiology: Dr Lonni; next scheduled visit: 12/29/2023 GYN: Dr Dallie; Next Scheduled visit 10/21/2023 Pulmonary: Dr Kassie; Next scheduled visit 10/29/2023  Medication Access/Adherence  Current Pharmacy:  [name] - [facility] [address] [place] [address] Phone: [phone] Fax: [phone]  [name] [address] [place] [address] Phone: [phone] Fax: [phone]   Patient reports affordability concerns with their medications: No  Patient reports access/transportation concerns to their pharmacy: No  Patient reports adherence concerns with their medications:  No      Diabetes:  Current medications:  Jardiance  25mg  daily, glipizide  10mg  - take 2 tablets every morning, Lantus  58 units once a day. Ozempic  0.5mg  weekly (restarted Ozempic  around 08/03/2023)  Medications tried in the past: Trulicity  - stopped because she had trouble getting it; Mounjaro  - chart states she stopped due to diarrhea and night sweats but patient did not remember taking Mounjaro ; metformin  - stopped because suspected was worsening diarrhea (fecal incontinence)   Current glucose readings: 116, 154, 220 (mostly checking in the morning / fasting) has checked a few after meals - 150 to 225 States blood glucose is  higher when she is in pain or when she has eaten certain foods that she knows will increase blood glucose.   Using Accu-Chek meter; testing 1 to 2 times daily She tried Continuous Glucose Monitor in past but didn't like it - sensor did not stay on and too many alerts. States the sensors just don't work with me   Patient reports no hypoglycemic s/sx including no dizziness, shakiness, sweating once in the last 4 weeks  Patient denies hyperglycemic symptoms including no polyuria, polydipsia, polyphagia, nocturia, neuropathy, blurred vision.  Diet:  Eating some vegetables - Brussels sprouts, broccoli,  Snacks - tried cottage cheese, Sugar Snap peas Sometimes cookies  Current medication access support: none   Macrovascular and Microvascular Risk Reduction:  Statin? yes (rosuvastatin  40mg ); ACEi/ARB? No due to low blood pressure  Last urinary albumin/creatinine ratio:  Lab Results  Component Value Date   MICRALBCREAT 20.6 03/18/2023   Last eye exam:  Lab Results  Component Value Date   HMDIABEYEEXA No Retinopathy 04/24/2022   Last foot exam: 10/23/2022 Tobacco Use:  Tobacco Use: Low Risk  (09/29/2023)   Patient History    Smoking Tobacco Use: Never    Smokeless Tobacco Use: Never    Passive Exposure: Not on file     Hypertension:  Current medications: bisoprolol  5mg  once at bedtime  Patient denies hypotensive s/sx including no dizziness, lightheadedness.  Patient denies hypertensive symptoms including no headache, chest pain, shortness of breath  Hyperlipidemia:  Last LDL was 199 She restarted rosuvastatin  40mg  daily 07/23/2023.  Patient report she is tolerating rosuvastatin  without any side effects.   Objective:  BP Readings from Last 3 Encounters:  09/29/23 93/61  09/25/23 (!) 98/58  09/22/23 131/61     Lab  Results  Component Value Date   HGBA1C 9.2 (H) 09/25/2023    Lab Results  Component Value Date   CREATININE 1.02 (H) 08/01/2023   BUN 21 08/01/2023    NA 135 08/01/2023   K 4.3 08/01/2023   CL 101 08/01/2023   CO2 21 (L) 08/01/2023    Lab Results  Component Value Date   CHOL 281 (H) 11/14/2022   HDL 46 11/14/2022   LDLCALC 161 (H) 11/14/2022   TRIG 389 (H) 11/14/2022   CHOLHDL 6.1 (H) 11/14/2022    Medications Reviewed Today     Reviewed by Carla Milling, RPH-CPP (Pharmacist) on 10/09/23 at 1426  Med List Status: <None>   Medication Order Taking? Sig Documenting Provider Last Dose Status Informant  ACCU-CHEK GUIDE test strip [id number] Yes USE TO check blood glucose UP TO four times daily AS DIRECTED Allwardt, Alyssa M, PA-C  Active Self  Accu-Chek Softclix Lancets lancets [id number] Yes USE TO check blood glucose UP TO four times daily AS DIRECTED Allwardt, Alyssa M, PA-C  Active Self  acetaminophen  (TYLENOL ) 500 MG tablet [id number] Yes Take 500 mg by mouth every 6 (six) hours as needed for mild pain (pain score 1-3) or moderate pain (pain score 4-6). [provider]  Active Self  albuterol  (VENTOLIN  HFA) 108 (90 Base) MCG/ACT inhaler [id number] Yes Inhale 2 puffs into the lungs every 6 (six) hours as needed for wheezing or shortness of breath. Kassie Acquanetta Bradley, MD  Active Self  anastrozole  (ARIMIDEX ) 1 MG tablet [id number] Yes Take 1 tablet (1 mg total) by mouth daily. Gudena, Vinay, MD  Active   ascorbic acid (VITAMIN C) 500 MG tablet [id number] Yes Take 500 mg by mouth daily. [provider]  Active Self  aspirin  EC (ASPIRIN  81) 81 MG tablet [id number] Yes Take 1 tablet (81 mg total) by mouth in the morning. Allwardt, Mardy HERO, PA-C  Active Self  azelastine  (ASTELIN ) 0.1 % nasal spray [id number]  Place 2 sprays into both nostrils 2 (two) times daily. Use in each nostril as directed Allwardt, Alyssa M, PA-C  Active Self  bisoprolol  (ZEBETA ) 5 MG tablet [id number] Yes Take 1 tablet (5 mg total) by mouth at bedtime. Nahser, Aleene PARAS, MD  Active Self  blood glucose meter kit and supplies [id number] Yes Dispense based on  patient and insurance preference. Use up to four times daily as directed. (FOR ICD-10 E10.9, E11.9). Allwardt, Mardy HERO, PA-C  Active Self  buPROPion  (WELLBUTRIN  XL) 150 MG 24 hr tablet [id number] Yes Take 1 tablet (150 mg total) by mouth every morning for depression. Allwardt, Mardy HERO, PA-C  Active Self  celecoxib  (CELEBREX ) 200 MG capsule [id number] Yes Take 1 capsule (200 mg total) by mouth 2 (two) times daily. Allwardt, Alyssa M, PA-C  Active   CINNAMON PO [id number] Yes Take 600 mg by mouth daily with supper. [provider]  Active Self  clotrimazole -betamethasone  (LOTRISONE) cream [id number]  Apply 1 Application topically daily as needed (Rash). [provider]  Active Self  diclofenac  sodium (VOLTAREN ) 1 % GEL [id number]  Apply 2-4 g topically 4 (four) times daily as needed (as directed for pain). [provider]  Active Self  empagliflozin  (JARDIANCE ) 25 MG TABS tablet [id number] Yes Take 1 tablet (25 mg total) by mouth daily. Allwardt, Alyssa M, PA-C  Active   escitalopram  (LEXAPRO ) 20 MG tablet [id number] Yes Take 1 tablet (20 mg total) by mouth daily. Allwardt, Mardy HERO, PA-C  Active Self  fenofibrate  (TRICOR ) 145 MG tablet [id number]  Yes Take 1 tablet (145 mg total) by mouth at bedtime. Allwardt, Mardy HERO, PA-C  Active   Ferrous Sulfate (IRON PO) [id number]  Take 1 tablet by mouth daily. alternates days:1 tablet one day and 2 tablets the next day [provider]  Active Self  FIBER PO [id number]  Take 1 tablet by mouth daily. [provider]  Active Self  gabapentin  (NEURONTIN ) 600 MG tablet [id number] Yes Take 1 tablet (600 mg total) by mouth 3 (three) times daily. Skeet Juliene SAUNDERS, DO  Active   glipiZIDE  (GLUCOTROL  XL) 10 MG 24 hr tablet [id number]  Take 2 tablets (20 mg total) by mouth every morning. Allwardt, Mardy HERO, PA-C  Active   glucose blood test strip [id number]  Use to check blood sugar up to four times daily as directed Allwardt, Alyssa M, PA-C   Active Self  HYDROcodone -acetaminophen  (NORCO/VICODIN) 5-325 MG tablet [id number]  Take 1 tablet by mouth every 6 (six) hours as needed.  Patient not taking: Reported on 10/09/2023   Gerome Maurilio HERO, PA-C  Active   Incontinence Supply Disposable (DEPEND ADJUSTABLE UNDERWEAR) MISC [id number]  1 Application by Does not apply route daily. Allwardt, Mardy HERO, PA-C  Active Self  insulin  glargine (LANTUS  SOLOSTAR) 100 UNIT/ML Solostar Pen [id number] Yes Start with injecting 4 units under the skin in the evenings. Increase by 2 units every 2 days until monring fasting glucose is to goal (140). Max units 60 per day.  Patient taking differently: 58 Units daily. Start with injecting 4 units under the skin in the evenings. Increase by 2 units every 2 days until monring fasting glucose is to goal (140). Max units 60 per day.   Allwardt, Mardy HERO, PA-C  Active Self  Insulin  Pen Needle (INSUPEN PEN NEEDLES) 32G X 4 MM MISC [id number] Yes Use to inject insulin  as directed Allwardt, Mardy HERO, PA-C  Active Self  ketoconazole  (NIZORAL ) 2 % cream [id number]  Apply 1 Application topically daily. Allwardt, Mardy HERO, PA-C  Active Self  Lancets MISC [id number]  Use to check blood sugar up to four times daily as directed Allwardt, Mardy HERO, PA-C  Active Self  levothyroxine  (SYNTHROID ) 125 MCG tablet [id number] Yes Take 1 tablet (125 mcg total) by mouth daily before breakfast. Allwardt, Alyssa M, PA-C  Active   loratadine  (ALLERGY RELIEF) 10 MG tablet [id number] Yes Take 1 tablet (10 mg total) by mouth every evening. Allwardt, Mardy HERO, PA-C  Active Self  magnesium  gluconate (MAGONATE) 500 (27 Mg) MG TABS tablet [id number]  Take 500 mg by mouth at bedtime. [provider]  Active Self  Multiple Vitamins-Calcium  (ONE-A-DAY WOMENS PO) [id number]  Take 1 tablet by mouth daily. [provider]  Active Self  mupirocin  cream (BACTROBAN ) 2 % [id number]  Apply 1 Application topically 3 (three) times daily. Zamora, Erin R, NP   Active   nitroGLYCERIN  (NITROSTAT ) 0.4 MG SL tablet [id number]  Dissolve 1 tablet under the tongue every 5 minutes as needed for chest pain. Max of 3 doses, then 911. Nahser, Aleene PARAS, MD  Active Self  nortriptyline  (PAMELOR ) 10 MG capsule [id number] Yes Take 1 capsule (10 mg total) by mouth at bedtime. Skeet Juliene SAUNDERS, DO  Active   omeprazole  (PRILOSEC) 40 MG capsule [id number] Yes Take 1 capsule (40 mg total) by mouth every evening. Allwardt, Mardy HERO, PA-C  Active   polyethylene glycol powder (MIRALAX ) 17 GM/SCOOP powder [id number]  Take 17 g by mouth daily. Craig Alan SAUNDERS, PA-C  Active Self  Potassium 99  MG TABS [id number]  Take 99 mg by mouth at bedtime. [provider]  Active Self  rOPINIRole  (REQUIP ) 1 MG tablet [id number] Yes Take 1 tablet (1 mg total) by mouth at bedtime. Corey, Evan S, MD  Active   rosuvastatin  (CRESTOR ) 40 MG tablet [id number]  Take 1 tablet (40 mg total) by mouth daily. Mona Vinie BROCKS, MD  Active Self  Semaglutide ,0.25 or 0.5MG /DOS, (OZEMPIC , 0.25 OR 0.5 MG/DOSE,) 2 MG/3ML SOPN [id number] Yes Inject 0.25 mg into the skin once a week for 28 days, THEN 0.5 mg once a week for 28 days.  Patient taking differently: Inject 0.5 mg once a week    Allwardt, Alyssa M, PA-C  Active   sulfamethoxazole -trimethoprim  (BACTRIM  DS) 800-160 MG tablet [id number]  Take 1 tablet by mouth 2 (two) times daily.  Patient not taking: Reported on 09/29/2023   Jule Ronal CROME, PA-C  Active   Tiotropium Bromide-Olodaterol (STIOLTO RESPIMAT ) 2.5-2.5 MCG/ACT AERS [id number] Yes Inhale 2 puffs into the lungs daily. Kassie Acquanetta Bradley, MD  Active Self  tiZANidine  (ZANAFLEX ) 2 MG tablet [id number] Yes Take 1 tablet (2 mg total) by mouth 3 (three) times daily. Skeet Juliene SAUNDERS, DO  Active Self  triamcinolone  (KENALOG ) 0.1 % paste [id number]  Apply a small amount to affected area twice daily for 2 - 3 weeks.   Active Self  TURMERIC PO [id number]  Take 450 mg by mouth daily with supper. [provider]   Active Self  Med List Note Christie Alyson Sola 07/31/23 9041): [place]              Assessment/Plan:   Diabetes: Currently uncontrolled - but improving with Lantus  titration and restarting Ozempic .  - Reviewed goal A1c, goal fasting, and goal 2 hour post prandial glucose - Recommend to continue Jardiance , glipizide   - Continue Lantus  from 58 units once a day - Continue Ozempic  0.5mg  weekly - would like to increase to 1mg  weekly but since she will have initial visit with endo in 5 days will defer to them.  - Recommend to check glucose 1 to 2 times a day a varying times of day.  - Discussed lower sugar / carbohydrate snack options - continue cottage cheese or sugar snap peas in place of cookies.    Hypertension:Currently controlled - Reviewed long term cardiovascular and renal outcomes of uncontrolled blood pressure - Recommended to check home blood pressure and heart rate when she feels lightheaded / dizzy - Recommend to continue bisoprolol  (could consider ACEI or ARB in the future)    Hyperlipidemia  -continue rosuvastatin  40mg  daily - due to recheck lipids  Medication Management:  - Discussed yearly diabetic eye exam - forwarded patient a list of a few office near her home.   Follow Up Plan: 4 weeks  Madelin Ray, PharmD Clinical Pharmacist [facility] Primary Care  Population Health [phone]

## 2023-10-10 ENCOUNTER — Ambulatory Visit
Admission: RE | Admit: 2023-10-10 | Discharge: 2023-10-10 | Disposition: A | Discharge status: Home / Self Care | Source: Ambulatory Visit | Attending: Neurology | Admitting: Neurology

## 2023-10-10 ENCOUNTER — Other Ambulatory Visit: Payer: Self-pay

## 2023-10-10 DIAGNOSIS — M4802 Spinal stenosis, cervical region: Secondary | ICD-10-CM | POA: Diagnosis not present

## 2023-10-10 DIAGNOSIS — M47812 Spondylosis without myelopathy or radiculopathy, cervical region: Secondary | ICD-10-CM | POA: Diagnosis not present

## 2023-10-14 ENCOUNTER — Encounter: Payer: Self-pay | Admitting: Oncology

## 2023-10-14 ENCOUNTER — Other Ambulatory Visit: Payer: Self-pay

## 2023-10-14 ENCOUNTER — Encounter: Payer: Self-pay | Admitting: Endocrinology

## 2023-10-14 ENCOUNTER — Other Ambulatory Visit (HOSPITAL_COMMUNITY): Payer: Self-pay

## 2023-10-14 ENCOUNTER — Ambulatory Visit: Admitting: Endocrinology

## 2023-10-14 VITALS — BP 118/62 | HR 77 | Resp 16 | Ht 64.0 in | Wt 186.4 lb

## 2023-10-14 DIAGNOSIS — E1165 Type 2 diabetes mellitus with hyperglycemia: Secondary | ICD-10-CM

## 2023-10-14 DIAGNOSIS — Z794 Long term (current) use of insulin: Secondary | ICD-10-CM | POA: Diagnosis not present

## 2023-10-14 MED ORDER — DEXCOM G7 RECEIVER DEVI
1.0000 | 0 refills | Status: AC
  Filled 2023-10-14: qty 1, 30d supply, fill #0

## 2023-10-14 MED ORDER — DEXCOM G7 SENSOR MISC
1.0000 | 3 refills | Status: AC
  Filled 2023-10-14: qty 9, 90d supply, fill #0
  Filled 2024-01-06: qty 9, 90d supply, fill #1

## 2023-10-14 MED ORDER — SEMAGLUTIDE (1 MG/DOSE) 4 MG/3ML ~~LOC~~ SOPN
1.0000 mg | PEN_INJECTOR | SUBCUTANEOUS | 3 refills | Status: DC
  Filled 2023-10-14: qty 9, 84d supply, fill #0
  Filled 2023-12-31: qty 9, 84d supply, fill #1

## 2023-10-14 NOTE — Progress Notes (Signed)
 Outpatient Endocrinology Note [place] Luka Stohr, MD   Patient's Name: Felicia [name]    DOB: 16-Apr-1950    MRN: [id number]                                                    REASON OF VISIT: New consult for type 2 diabetes mellitus  REFERRING PROVIDER: Allwardt, Mardy HERO, PA-C  PCP: Allwardt, Mardy HERO, PA-C  HISTORY OF PRESENT ILLNESS:   Felicia [name] is a 73 y.o. old female with past medical history listed below, is here for new consult for type 2 diabetes mellitus.   Pertinent Diabetes History: Patient is referred to endocrinology for evaluation and management of type 2 diabetes mellitus uncontrolled.  Initial consult in October 14, 2023.  Patient was diagnosed with type 2 diabetes mellitus around late 1990s.  History of DKA or diabetes related hospitalizations: none  Previous diabetes education: Yes , long time ago.   Family h/o diabetes mellitus: father and brother / sister with type 2 diabetes.    No personal history of pancreatitis and / or family history of medullary thyroid  carcinoma or MEN 2B syndrome.   She has uncontrolled type 2 diabetes mellitus with mostly hemoglobin A1c in the range of 9 to 10% range.  She has mild improvement in diabetes with hemoglobin A1c of 9.2% from 10% recently.  She had reasonable control of diabetes with hemoglobin A1c up to 7% range in 2023.  Chronic Diabetes Complications : Retinopathy: no. Last ophthalmology exam was done on 04/2022, following with ophthalmology regularly.  Nephropathy: no Peripheral neuropathy: no Coronary artery disease: CAD s/p stents Stroke: no  Relevant comorbidities and cardiovascular risk factors: Obesity: yes Body mass index is 32 kg/m.  Hypertension: Yes  Hyperlipidemia : Yes, on statin   Current / Home Diabetic regimen includes:  Lantus  58 units at bedtime. Ozempic  0.5mg  weekly, Sundays.  Glipizide  XL 10mg  2 tabs with breakfast. Jardiance  25mg  daily.  Prior diabetic medications: Trulicity  in the past  he stopped due to backorder.  Changed to Mounjaro  stopped due to diarrhea and night sweats.  Metformin  stopped due to diarrhea.  She was started on Ozempic  in June and has been on 0.5 mg weekly since around August 03, 2023.  Glycemic data:   She reports she had used freestyle libre sensor in the past had adhesive problem.  Felicia [name]'s glucose meter is computer downloaded. Raw data and trends analyzed.  She has been using Accu-Chek guide glucose meter, glucose download from September 16 to October 14, 2023 reviewed average blood sugar 150.  She has been checking mostly once a day in the morning fasting.  Some of the fasting blood sugar 131, 148, 179, 140, 151, 158, 151, 214, 116, 122, 132.  Mostly acceptable fasting blood sugar.  She had blood sugar of 190 at bedtime checked once in last 2 weeks.   Hypoglycemia: Patient has no hypoglycemic episodes. Patient has hypoglycemia awareness.  Factors modifying glucose control: 1.  Diabetic diet assessment: 3 meals a day.  Usually drinks diet soda and breads.  2.  Staying active or exercising: No formal exercise.  3.  Medication compliance: compliant all of the time.  # Primary hypothyroidism : Taking levothyroxine  125 mcg daily.  TSH normal in September 2025.  Managed by PCP.  Interval history  Patient presented to establish diabetes care.  REVIEW  OF SYSTEMS As per history of present illness.   PAST MEDICAL HISTORY: Past Medical History:  Diagnosis Date   Adenomatous colon polyp 2015   Anemia    Anxiety    Breast cancer (HCC) 09/2019   left breast IMC   CAD (coronary artery disease)    a. 3/2007s/p DES to the LAD ([place] ); b. 01/2017 MV: EF 80%, small, mild apical ant defect w/o ischemia (felt to be breast atten). Low risk.   Depression    DM (diabetes mellitus) (HCC)    Emphysema lung (HCC)    Essential hypertension    GERD (gastroesophageal reflux disease)    Headache    secondary to neck surgery per patient   High  triglycerides    HTN (hypertension)    Hyperlipidemia    Hypothyroid    OA (osteoarthritis)    right knee,hands   Overflow incontinence    Pneumonia 2003   PONV (postoperative nausea and vomiting)    s/p gallbladder    RLS (restless legs syndrome)    Urinary urgency    Uterine cancer (HCC) dx'd 2014    PAST SURGICAL HISTORY: Past Surgical History:  Procedure Laterality Date   BREAST LUMPECTOMY WITH RADIOACTIVE SEED AND SENTINEL LYMPH NODE BIOPSY Left 10/07/2019   Procedure: LEFT BREAST LUMPECTOMY WITH RADIOACTIVE SEED AND SENTINEL LYMPH NODE BIOPSY;  Surgeon: Ebbie Cough, MD;  Location: [facility];  Service: General;  Laterality: Left;   CHOLECYSTECTOMY     COLONOSCOPY     CORONARY STENT PLACEMENT     CORONARY/GRAFT ACUTE MI REVASCULARIZATION     FINGER ARTHRODESIS Right 06/11/2019   Procedure: ARTHRODESIS INDEX FINGER DISTAL PHALANGEAL JOINT;  Surgeon: Murrell Drivers, MD;  Location: [facility];  Service: Orthopedics;  Laterality: Right;   LEFT HEART CATH AND CORONARY ANGIOGRAPHY N/A 02/16/2018   Procedure: LEFT HEART CATH AND CORONARY ANGIOGRAPHY;  Surgeon: Court Dorn PARAS, MD;  Location: [facility];  Service: Cardiovascular;  Laterality: N/A;   LEFT HEART CATH AND CORONARY ANGIOGRAPHY N/A 05/01/2020   Procedure: LEFT HEART CATH AND CORONARY ANGIOGRAPHY;  Surgeon: Court Dorn PARAS, MD;  Location: [facility];  Service: Cardiovascular;  Laterality: N/A;   ORIF ANKLE FRACTURE Right 08/01/2023   Procedure: OPEN REDUCTION INTERNAL FIXATION (ORIF) ANKLE FRACTURE, RIGHT;  Surgeon: Harden Jerona GAILS, MD;  Location: [facility];  Service: Orthopedics;  Laterality: Right;  RIGHT ORIF DISTAL FIBULA FRACTURE   PORTACATH PLACEMENT Right 11/23/2019   Procedure: INSERTION PORT-A-CATH WITH ULTRASOUND GUIDANCE;  Surgeon: Ebbie Cough, MD;  Location: [facility];  Service: General;  Laterality: Right;   POSTERIOR FUSION CERVICAL SPINE      C3-C7   REPLACEMENT TOTAL KNEE Right    SPINE SURGERY     TOTAL ABDOMINAL HYSTERECTOMY     FOR UTERUS CANCER   TOTAL HIP ARTHROPLASTY Left 2015    ALLERGIES: Allergies  Allergen Reactions   Chloraprep One Step [Chlorhexidine  Gluconate] Rash   Estrogens Other (See Comments)    PATIENT HAS A HISTORY OF CANCER AND HAS BEEN TOLD TO NEVER TAKE ANYTHING CONTAINING ESTROGEN, AS IT MIGHT CAUSE A RECURRENCE  Other Reaction(s): bad for cancer    Metformin  And Related Diarrhea   Other Dermatitis   Shellfish-Derived Products Nausea And Vomiting   Shrimp [Shellfish Allergy] Nausea And Vomiting   Doxycycline  Itching and Rash    FAMILY HISTORY:  Family History  Problem Relation Age of Onset   AAA (abdominal aortic aneurysm) Mother  Thyroid  disease Mother    Colon cancer Mother    Diabetes Father    Alzheimer's disease Father    Arthritis Sister    Heart disease Sister    Throat cancer Sister    Kidney failure Sister    Diabetes Sister    Diabetes Brother    Diabetes Brother    Breast cancer Cousin     SOCIAL HISTORY: Social History   Socioeconomic History   Marital status: Divorced    Spouse name: Not on file   Number of children: 2   Years of education: Not on file   Highest education level: Not on file  Occupational History   Occupation: retired  Tobacco Use   Smoking status: Never   Smokeless tobacco: Never  Vaping Use   Vaping status: Never Used  Substance and Sexual Activity   Alcohol use: Not Currently    Comment: OCCASIONALLY   Drug use: No   Sexual activity: Not Currently    Birth control/protection: Surgical    Comment: hyst  Other Topics Concern   Not on file  Social History Narrative   Originally from [place] , moved here in 2018.    Right handed   Drinks decaf   One floor house   Social Drivers of Health   Financial Resource Strain: Low Risk  (07/01/2023)   Overall Financial Resource Strain (CARDIA)    Difficulty of Paying Living  Expenses: Not hard at all  Food Insecurity: No Food Insecurity (08/01/2023)   Hunger Vital Sign    Worried About Running Out of Food in the Last Year: Never true    Ran Out of Food in the Last Year: Never true  Transportation Needs: Unmet Transportation Needs (09/19/2023)   PRAPARE - Administrator, Civil Service (Medical): Yes    Lack of Transportation (Non-Medical): No  Physical Activity: Inactive (07/01/2023)   Exercise Vital Sign    Days of Exercise per Week: 0 days    Minutes of Exercise per Session: 0 min  Stress: Stress Concern Present (07/01/2023)   [employer] of Occupational Health - Occupational Stress Questionnaire    Feeling of Stress: To some extent  Social Connections: Socially Isolated (08/01/2023)   Social Connection and Isolation Panel    Frequency of Communication with Friends and Family: Twice a week    Frequency of Social Gatherings with Friends and Family: Twice a week    Attends Religious Services: Never    Database administrator or Organizations: No    Attends Engineer, structural: Never    Marital Status: Divorced    MEDICATIONS:  Current Outpatient Medications  Medication Sig Dispense Refill   ACCU-CHEK GUIDE test strip USE TO check blood glucose UP TO four times daily AS DIRECTED 100 strip 0   Accu-Chek Softclix Lancets lancets USE TO check blood glucose UP TO four times daily AS DIRECTED 100 each 0   acetaminophen  (TYLENOL ) 500 MG tablet Take 500 mg by mouth every 6 (six) hours as needed for mild pain (pain score 1-3) or moderate pain (pain score 4-6).     albuterol  (VENTOLIN  HFA) 108 (90 Base) MCG/ACT inhaler Inhale 2 puffs into the lungs every 6 (six) hours as needed for wheezing or shortness of breath. 6.7 g 2   anastrozole  (ARIMIDEX ) 1 MG tablet Take 1 tablet (1 mg total) by mouth daily. 90 tablet 4   ascorbic acid (VITAMIN C) 500 MG tablet Take 500 mg by mouth daily.  aspirin  EC (ASPIRIN  81) 81 MG tablet Take 1 tablet (81 mg  total) by mouth in the morning. 90 tablet 99   azelastine  (ASTELIN ) 0.1 % nasal spray Place 2 sprays into both nostrils 2 (two) times daily. Use in each nostril as directed 30 mL 11   bisoprolol  (ZEBETA ) 5 MG tablet Take 1 tablet (5 mg total) by mouth at bedtime. 90 tablet 3   blood glucose meter kit and supplies Dispense based on patient and insurance preference. Use up to four times daily as directed. (FOR ICD-10 E10.9, E11.9). 1 each 0   buPROPion  (WELLBUTRIN  XL) 150 MG 24 hr tablet Take 1 tablet (150 mg total) by mouth every morning for depression. 30 tablet 2   celecoxib  (CELEBREX ) 200 MG capsule Take 1 capsule (200 mg total) by mouth 2 (two) times daily. 180 capsule 1   CINNAMON PO Take 600 mg by mouth daily with supper.     clotrimazole -betamethasone  (LOTRISONE) cream Apply 1 Application topically daily as needed (Rash).     Continuous Glucose Receiver (DEXCOM G7 RECEIVER) DEVI Use to monitor glucose continuously. 1 each 0   Continuous Glucose Sensor (DEXCOM G7 SENSOR) MISC Change sensor every 10 days. 9 each 3   diclofenac  sodium (VOLTAREN ) 1 % GEL Apply 2-4 g topically 4 (four) times daily as needed (as directed for pain).     empagliflozin  (JARDIANCE ) 25 MG TABS tablet Take 1 tablet (25 mg total) by mouth daily. 90 tablet 1   escitalopram  (LEXAPRO ) 20 MG tablet Take 1 tablet (20 mg total) by mouth daily. 90 tablet 1   fenofibrate  (TRICOR ) 145 MG tablet Take 1 tablet (145 mg total) by mouth at bedtime. 90 tablet 3   Ferrous Sulfate (IRON PO) Take 1 tablet by mouth daily. alternates days:1 tablet one day and 2 tablets the next day     FIBER PO Take 1 tablet by mouth daily.     gabapentin  (NEURONTIN ) 600 MG tablet Take 1 tablet (600 mg total) by mouth 3 (three) times daily. 90 tablet 5   glipiZIDE  (GLUCOTROL  XL) 10 MG 24 hr tablet Take 2 tablets (20 mg total) by mouth every morning. 90 tablet 1   glucose blood test strip Use to check blood sugar up to four times daily as directed 100 each 3    HYDROcodone -acetaminophen  (NORCO/VICODIN) 5-325 MG tablet Take 1 tablet by mouth every 6 (six) hours as needed. 30 tablet 0   Incontinence Supply Disposable (DEPEND ADJUSTABLE UNDERWEAR) MISC 1 Application by Does not apply route daily. 16 each 11   insulin  glargine (LANTUS  SOLOSTAR) 100 UNIT/ML Solostar Pen Start with injecting 4 units under the skin in the evenings. Increase by 2 units every 2 days until monring fasting glucose is to goal (140). Max units 60 per day. (Patient taking differently: 58 Units daily. Start with injecting 4 units under the skin in the evenings. Increase by 2 units every 2 days until monring fasting glucose is to goal (140). Max units 60 per day.) 15 mL 11   Insulin  Pen Needle (INSUPEN PEN NEEDLES) 32G X 4 MM MISC Use to inject insulin  as directed 100 each 3   ketoconazole  (NIZORAL ) 2 % cream Apply 1 Application topically daily. 60 g 0   Lancets MISC Use to check blood sugar up to four times daily as directed 100 each 3   levothyroxine  (SYNTHROID ) 125 MCG tablet Take 1 tablet (125 mcg total) by mouth daily before breakfast. 30 tablet 2   loratadine  (ALLERGY RELIEF)  10 MG tablet Take 1 tablet (10 mg total) by mouth every evening. 90 tablet 3   magnesium  gluconate (MAGONATE) 500 (27 Mg) MG TABS tablet Take 500 mg by mouth at bedtime.     Multiple Vitamins-Calcium  (ONE-A-DAY WOMENS PO) Take 1 tablet by mouth daily.     mupirocin  cream (BACTROBAN ) 2 % Apply 1 Application topically 3 (three) times daily. 15 g 0   nitroGLYCERIN  (NITROSTAT ) 0.4 MG SL tablet Dissolve 1 tablet under the tongue every 5 minutes as needed for chest pain. Max of 3 doses, then 911. 75 tablet 2   nortriptyline  (PAMELOR ) 10 MG capsule Take 1 capsule (10 mg total) by mouth at bedtime. 30 capsule 5   omeprazole  (PRILOSEC) 40 MG capsule Take 1 capsule (40 mg total) by mouth every evening. 90 capsule 1   polyethylene glycol powder (MIRALAX ) 17 GM/SCOOP powder Take 17 g by mouth daily. 255 g 11   Potassium  99 MG TABS Take 99 mg by mouth at bedtime.     rOPINIRole  (REQUIP ) 1 MG tablet Take 1 tablet (1 mg total) by mouth at bedtime. 90 tablet 0   rosuvastatin  (CRESTOR ) 40 MG tablet Take 1 tablet (40 mg total) by mouth daily. 90 tablet 3   Semaglutide , 1 MG/DOSE, 4 MG/3ML SOPN Inject 1 mg as directed once a week. 9 mL 3   sulfamethoxazole -trimethoprim  (BACTRIM  DS) 800-160 MG tablet Take 1 tablet by mouth 2 (two) times daily. 14 tablet 0   Tiotropium Bromide-Olodaterol (STIOLTO RESPIMAT ) 2.5-2.5 MCG/ACT AERS Inhale 2 puffs into the lungs daily. 4 g 11   tiZANidine  (ZANAFLEX ) 2 MG tablet Take 1 tablet (2 mg total) by mouth 3 (three) times daily. 90 tablet 5   triamcinolone  (KENALOG ) 0.1 % paste Apply a small amount to affected area twice daily for 2 - 3 weeks. 5 g 1   TURMERIC PO Take 450 mg by mouth daily with supper.     No current facility-administered medications for this visit.    PHYSICAL EXAM: Vitals:   10/14/23 1437  BP: 118/62  Pulse: 77  Resp: 16  SpO2: 98%  Weight: 186 lb 6.4 oz (84.6 kg)  Height: 5' 4 (1.626 m)   Body mass index is 32 kg/m.  Wt Readings from Last 3 Encounters:  10/14/23 186 lb 6.4 oz (84.6 kg)  09/29/23 185 lb (83.9 kg)  09/25/23 181 lb 6.4 oz (82.3 kg)    General: Well developed, well nourished female in no apparent distress.  HEENT: AT/[place], no external lesions.  Eyes: Conjunctiva clear and no icterus. Neck: Neck supple  Lungs: Respirations not labored Neurologic: Alert, oriented, normal speech Extremities / Skin: Dry.  Psychiatric: Does not appear depressed or anxious  Diabetic Foot Exam - Simple   No data filed    LABS Reviewed Lab Results  Component Value Date   HGBA1C 9.2 (H) 09/25/2023   HGBA1C 10.0 (A) 06/20/2023   HGBA1C 9.2 (A) 03/18/2023   No results found for: FRUCTOSAMINE Lab Results  Component Value Date   CHOL 281 (H) 11/14/2022   HDL 46 11/14/2022   LDLCALC 161 (H) 11/14/2022   TRIG 389 (H) 11/14/2022   CHOLHDL 6.1 (H)  11/14/2022   Lab Results  Component Value Date   MICRALBCREAT 20.6 03/18/2023   Lab Results  Component Value Date   CREATININE 1.02 (H) 08/01/2023   Lab Results  Component Value Date   GFR 60.22 06/20/2023    ASSESSMENT / PLAN  1. Uncontrolled type 2 diabetes mellitus  with hyperglycemia (HCC)   2. Type 2 diabetes mellitus with hyperglycemia, with long-term current use of insulin  (HCC)     Diabetes Mellitus type 2, complicated by CAD - Diabetic status / severity: Uncontrolled, improving.  Lab Results  Component Value Date   HGBA1C 9.2 (H) 09/25/2023    - Hemoglobin A1c goal : <6.5%  Discussed about type 2 diabetes mellitus and potential chronic diabetic complications including diabetic retinopathy, neuropathy and nephropathy.  Discussed about controlling blood sugar to prevent complications.  Discussed that diet control is most important for type 2 diabetes management.  Discussed diet plan portion control and limiting carbohydrates in detail.  She has mostly acceptable blood sugar in the morning fasting.  She is probably having postprandial hyperglycemia.  Consider mealtime insulin  in the future visit, if not optimal improvement.  Adjusted diabetes regimen as follows.  - Medications: See below.  I) decrease Lantus  from 58 to 50 units daily at bedtime. II) increase Ozempic  from 0.5 to 1 mg weekly. III) continue glipizide  extended release 20 mg daily for now.  Consider decreasing in the future. IV) continue Jardiance  25 mg daily.  - Home glucose testing: Check blood sugar before meals and at bedtime.  Sent prescription for Dexcom G7 and receiver to check with the pharmacy regarding cost and coverage.  - Discussed/ Gave Hypoglycemia treatment plan.  # Consult : Offered dietitian visit, patient declined.  Patient reports she may have transportation problem.  # Annual urine for microalbuminuria/ creatinine ratio, no microalbuminuria currently, continue jardiance . Last   Lab Results  Component Value Date   MICRALBCREAT 20.6 03/18/2023    # Foot check nightly.  Patient on gabapentin  for headache and joint pain.  # Annual dilated diabetic eye exams.   - Diet: Make healthy diabetic food choices, discussed in detail. - Life style / activity / exercise: Discussed.  2. Blood pressure  -  BP Readings from Last 1 Encounters:  10/14/23 118/62    - Control is in target.  - No change in current plans.  3. Lipid status / Hyperlipidemia - Last  Lab Results  Component Value Date   LDLCALC 161 (H) 11/14/2022   - Continue rosuvastatin  40 mg daily.  Managed by PCP /cardiology.  Diagnoses and all orders for this visit:  Uncontrolled type 2 diabetes mellitus with hyperglycemia (HCC) -     Semaglutide , 1 MG/DOSE, 4 MG/3ML SOPN; Inject 1 mg as directed once a week. -     Continuous Glucose Sensor (DEXCOM G7 SENSOR) MISC; Change sensor every 10 days. -     Continuous Glucose Receiver (DEXCOM G7 RECEIVER) DEVI; Use to monitor glucose continuously.  Type 2 diabetes mellitus with hyperglycemia, with long-term current use of insulin  (HCC)    DISPOSITION Follow up in clinic in 3 months suggested.   All questions answered and patient verbalized understanding of the plan.  Iraq Abeer Iversen, MD [facility] Endocrinology [facility] [address] [place], [address] [place], [place] [address] Phone # [phone]  At least part of this note was generated using voice recognition software. Inadvertent word errors may have occurred, which were not recognized during the proofreading process.

## 2023-10-14 NOTE — Patient Instructions (Signed)
 Diabetes with  Decrease Lantus  to 50 units daily at bedtime.  Increase Ozempic  from 0.5 to 1 mg weekly.  Continue current dose of glipizide  and Jardiance .

## 2023-10-15 ENCOUNTER — Telehealth: Payer: Self-pay | Admitting: *Deleted

## 2023-10-15 DIAGNOSIS — C50412 Malignant neoplasm of upper-outer quadrant of left female breast: Secondary | ICD-10-CM | POA: Diagnosis not present

## 2023-10-15 NOTE — Telephone Encounter (Signed)
 Per MD request RN placed call to pt with recent Guardant Reveal results being negative.  Pt educated and verbalized understanding.

## 2023-10-16 ENCOUNTER — Other Ambulatory Visit (HOSPITAL_COMMUNITY): Payer: Self-pay

## 2023-10-16 ENCOUNTER — Encounter: Payer: Self-pay | Admitting: Hematology and Oncology

## 2023-10-17 ENCOUNTER — Other Ambulatory Visit (HOSPITAL_COMMUNITY): Payer: Self-pay

## 2023-10-17 ENCOUNTER — Other Ambulatory Visit: Payer: Self-pay

## 2023-10-17 ENCOUNTER — Other Ambulatory Visit: Payer: Self-pay | Admitting: Neurology

## 2023-10-17 MED ORDER — TIZANIDINE HCL 2 MG PO TABS
2.0000 mg | ORAL_TABLET | Freq: Three times a day (TID) | ORAL | 5 refills | Status: AC
  Filled 2023-10-17 (×2): qty 90, 30d supply, fill #0
  Filled 2023-11-12: qty 90, 30d supply, fill #1
  Filled 2023-12-07: qty 90, 30d supply, fill #2
  Filled 2024-01-11: qty 90, 30d supply, fill #3
  Filled 2024-02-05 – 2024-02-17 (×2): qty 90, 30d supply, fill #4

## 2023-10-17 NOTE — Progress Notes (Signed)
 Patient advised.

## 2023-10-20 NOTE — Progress Notes (Signed)
 73 y.o. No obstetric history on file. female here for vaginal bleeding. Divorced.  No LMP recorded. Patient has had a hysterectomy.    She reports ***. Urine sample provided: ***  Birth control: *** Last mammogram: 08/27/23 Sexually active: ***    GYN HISTORY: ***  OB History  No obstetric history on file.   Past Medical History:  Diagnosis Date   Adenomatous colon polyp 2015   Anemia    Anxiety    Breast cancer (HCC) 09/2019   left breast IMC   CAD (coronary artery disease)    a. 3/2007s/p DES to the LAD ([place] ); b. 01/2017 MV: EF 80%, small, mild apical ant defect w/o ischemia (felt to be breast atten). Low risk.   Depression    DM (diabetes mellitus) (HCC)    Emphysema lung (HCC)    Essential hypertension    GERD (gastroesophageal reflux disease)    Headache    secondary to neck surgery per patient   High triglycerides    HTN (hypertension)    Hyperlipidemia    Hypothyroid    OA (osteoarthritis)    right knee,hands   Overflow incontinence    Pneumonia 2003   PONV (postoperative nausea and vomiting)    s/p gallbladder    RLS (restless legs syndrome)    Urinary urgency    Uterine cancer (HCC) dx'd 2014   Past Surgical History:  Procedure Laterality Date   BREAST LUMPECTOMY WITH RADIOACTIVE SEED AND SENTINEL LYMPH NODE BIOPSY Left 10/07/2019   Procedure: LEFT BREAST LUMPECTOMY WITH RADIOACTIVE SEED AND SENTINEL LYMPH NODE BIOPSY;  Surgeon: Ebbie Cough, MD;  Location: [facility];  Service: General;  Laterality: Left;   CHOLECYSTECTOMY     COLONOSCOPY     CORONARY STENT PLACEMENT     CORONARY/GRAFT ACUTE MI REVASCULARIZATION     FINGER ARTHRODESIS Right 06/11/2019   Procedure: ARTHRODESIS INDEX FINGER DISTAL PHALANGEAL JOINT;  Surgeon: Murrell Drivers, MD;  Location: [facility];  Service: Orthopedics;  Laterality: Right;   LEFT HEART CATH AND CORONARY ANGIOGRAPHY N/A 02/16/2018   Procedure: LEFT HEART CATH AND  CORONARY ANGIOGRAPHY;  Surgeon: Court Dorn PARAS, MD;  Location: [facility];  Service: Cardiovascular;  Laterality: N/A;   LEFT HEART CATH AND CORONARY ANGIOGRAPHY N/A 05/01/2020   Procedure: LEFT HEART CATH AND CORONARY ANGIOGRAPHY;  Surgeon: Court Dorn PARAS, MD;  Location: [facility];  Service: Cardiovascular;  Laterality: N/A;   ORIF ANKLE FRACTURE Right 08/01/2023   Procedure: OPEN REDUCTION INTERNAL FIXATION (ORIF) ANKLE FRACTURE, RIGHT;  Surgeon: Harden Jerona GAILS, MD;  Location: [facility];  Service: Orthopedics;  Laterality: Right;  RIGHT ORIF DISTAL FIBULA FRACTURE   PORTACATH PLACEMENT Right 11/23/2019   Procedure: INSERTION PORT-A-CATH WITH ULTRASOUND GUIDANCE;  Surgeon: Ebbie Cough, MD;  Location: [facility];  Service: General;  Laterality: Right;   POSTERIOR FUSION CERVICAL SPINE     C3-C7   REPLACEMENT TOTAL KNEE Right    SPINE SURGERY     TOTAL ABDOMINAL HYSTERECTOMY     FOR UTERUS CANCER   TOTAL HIP ARTHROPLASTY Left 2015   Current Outpatient Medications on File Prior to Visit  Medication Sig Dispense Refill   ACCU-CHEK GUIDE test strip USE TO check blood glucose UP TO four times daily AS DIRECTED 100 strip 0   Accu-Chek Softclix Lancets lancets USE TO check blood glucose UP TO four times daily AS DIRECTED 100 each 0   acetaminophen  (TYLENOL ) 500 MG tablet  Take 500 mg by mouth every 6 (six) hours as needed for mild pain (pain score 1-3) or moderate pain (pain score 4-6).     albuterol  (VENTOLIN  HFA) 108 (90 Base) MCG/ACT inhaler Inhale 2 puffs into the lungs every 6 (six) hours as needed for wheezing or shortness of breath. 6.7 g 2   anastrozole  (ARIMIDEX ) 1 MG tablet Take 1 tablet (1 mg total) by mouth daily. 90 tablet 4   ascorbic acid (VITAMIN C) 500 MG tablet Take 500 mg by mouth daily.     aspirin  EC (ASPIRIN  81) 81 MG tablet Take 1 tablet (81 mg total) by mouth in the morning. 90 tablet 99   azelastine  (ASTELIN ) 0.1 % nasal spray Place 2  sprays into both nostrils 2 (two) times daily. Use in each nostril as directed 30 mL 11   bisoprolol  (ZEBETA ) 5 MG tablet Take 1 tablet (5 mg total) by mouth at bedtime. 90 tablet 3   blood glucose meter kit and supplies Dispense based on patient and insurance preference. Use up to four times daily as directed. (FOR ICD-10 E10.9, E11.9). 1 each 0   buPROPion  (WELLBUTRIN  XL) 150 MG 24 hr tablet Take 1 tablet (150 mg total) by mouth every morning for depression. 30 tablet 2   celecoxib  (CELEBREX ) 200 MG capsule Take 1 capsule (200 mg total) by mouth 2 (two) times daily. 180 capsule 1   CINNAMON PO Take 600 mg by mouth daily with supper.     clotrimazole -betamethasone  (LOTRISONE) cream Apply 1 Application topically daily as needed (Rash).     Continuous Glucose Receiver (DEXCOM G7 RECEIVER) DEVI Use to monitor glucose continuously. 1 each 0   Continuous Glucose Sensor (DEXCOM G7 SENSOR) MISC Change sensor every 10 days. 9 each 3   diclofenac  sodium (VOLTAREN ) 1 % GEL Apply 2-4 g topically 4 (four) times daily as needed (as directed for pain).     empagliflozin  (JARDIANCE ) 25 MG TABS tablet Take 1 tablet (25 mg total) by mouth daily. 90 tablet 1   escitalopram  (LEXAPRO ) 20 MG tablet Take 1 tablet (20 mg total) by mouth daily. 90 tablet 1   fenofibrate  (TRICOR ) 145 MG tablet Take 1 tablet (145 mg total) by mouth at bedtime. 90 tablet 3   Ferrous Sulfate (IRON PO) Take 1 tablet by mouth daily. alternates days:1 tablet one day and 2 tablets the next day     FIBER PO Take 1 tablet by mouth daily.     gabapentin  (NEURONTIN ) 600 MG tablet Take 1 tablet (600 mg total) by mouth 3 (three) times daily. 90 tablet 5   glipiZIDE  (GLUCOTROL  XL) 10 MG 24 hr tablet Take 2 tablets (20 mg total) by mouth every morning. 90 tablet 1   glucose blood test strip Use to check blood sugar up to four times daily as directed 100 each 3   HYDROcodone -acetaminophen  (NORCO/VICODIN) 5-325 MG tablet Take 1 tablet by mouth every 6  (six) hours as needed. 30 tablet 0   Incontinence Supply Disposable (DEPEND ADJUSTABLE UNDERWEAR) MISC 1 Application by Does not apply route daily. 16 each 11   insulin  glargine (LANTUS  SOLOSTAR) 100 UNIT/ML Solostar Pen Start with injecting 4 units under the skin in the evenings. Increase by 2 units every 2 days until monring fasting glucose is to goal (140). Max units 60 per day. (Patient taking differently: 58 Units daily. Start with injecting 4 units under the skin in the evenings. Increase by 2 units every 2 days until monring fasting glucose  is to goal (140). Max units 60 per day.) 15 mL 11   Insulin  Pen Needle (INSUPEN PEN NEEDLES) 32G X 4 MM MISC Use to inject insulin  as directed 100 each 3   ketoconazole  (NIZORAL ) 2 % cream Apply 1 Application topically daily. 60 g 0   Lancets MISC Use to check blood sugar up to four times daily as directed 100 each 3   levothyroxine  (SYNTHROID ) 125 MCG tablet Take 1 tablet (125 mcg total) by mouth daily before breakfast. 30 tablet 2   loratadine  (ALLERGY RELIEF) 10 MG tablet Take 1 tablet (10 mg total) by mouth every evening. 90 tablet 3   magnesium  gluconate (MAGONATE) 500 (27 Mg) MG TABS tablet Take 500 mg by mouth at bedtime.     Multiple Vitamins-Calcium  (ONE-A-DAY WOMENS PO) Take 1 tablet by mouth daily.     mupirocin  cream (BACTROBAN ) 2 % Apply 1 Application topically 3 (three) times daily. 15 g 0   nitroGLYCERIN  (NITROSTAT ) 0.4 MG SL tablet Dissolve 1 tablet under the tongue every 5 minutes as needed for chest pain. Max of 3 doses, then 911. 75 tablet 2   nortriptyline  (PAMELOR ) 10 MG capsule Take 1 capsule (10 mg total) by mouth at bedtime. 30 capsule 5   omeprazole  (PRILOSEC) 40 MG capsule Take 1 capsule (40 mg total) by mouth every evening. 90 capsule 1   polyethylene glycol powder (MIRALAX ) 17 GM/SCOOP powder Take 17 g by mouth daily. 255 g 11   Potassium 99 MG TABS Take 99 mg by mouth at bedtime.     rOPINIRole  (REQUIP ) 1 MG tablet Take 1 tablet  (1 mg total) by mouth at bedtime. 90 tablet 0   rosuvastatin  (CRESTOR ) 40 MG tablet Take 1 tablet (40 mg total) by mouth daily. 90 tablet 3   Semaglutide , 1 MG/DOSE, 4 MG/3ML SOPN Inject 1 mg as directed once a week. 9 mL 3   sulfamethoxazole -trimethoprim  (BACTRIM  DS) 800-160 MG tablet Take 1 tablet by mouth 2 (two) times daily. 14 tablet 0   Tiotropium Bromide-Olodaterol (STIOLTO RESPIMAT ) 2.5-2.5 MCG/ACT AERS Inhale 2 puffs into the lungs daily. 4 g 11   tiZANidine  (ZANAFLEX ) 2 MG tablet Take 1 tablet (2 mg total) by mouth 3 (three) times daily. 90 tablet 5   triamcinolone  (KENALOG ) 0.1 % paste Apply a small amount to affected area twice daily for 2 - 3 weeks. 5 g 1   TURMERIC PO Take 450 mg by mouth daily with supper.     No current facility-administered medications on file prior to visit.   Allergies  Allergen Reactions   Chloraprep One Step [Chlorhexidine  Gluconate] Rash   Estrogens Other (See Comments)    PATIENT HAS A HISTORY OF CANCER AND HAS BEEN TOLD TO NEVER TAKE ANYTHING CONTAINING ESTROGEN, AS IT MIGHT CAUSE A RECURRENCE  Other Reaction(s): bad for cancer    Metformin  And Related Diarrhea   Other Dermatitis   Shellfish-Derived Products Nausea And Vomiting   Shrimp [Shellfish Allergy] Nausea And Vomiting   Doxycycline  Itching and Rash      PE There were no vitals filed for this visit. There is no height or weight on file to calculate BMI.  Physical Exam    Assessment and Plan:        There are no diagnoses linked to this encounter.  Felicia FORBES Pa, CMA

## 2023-10-21 ENCOUNTER — Ambulatory Visit (INDEPENDENT_AMBULATORY_CARE_PROVIDER_SITE_OTHER): Payer: Self-pay | Admitting: Obstetrics and Gynecology

## 2023-10-21 ENCOUNTER — Encounter: Payer: Self-pay | Admitting: Oncology

## 2023-10-21 ENCOUNTER — Encounter: Payer: Self-pay | Admitting: Obstetrics and Gynecology

## 2023-10-21 VITALS — BP 96/62 | HR 75 | Temp 98.2°F | Ht 64.25 in | Wt 182.0 lb

## 2023-10-21 DIAGNOSIS — N950 Postmenopausal bleeding: Secondary | ICD-10-CM | POA: Diagnosis not present

## 2023-10-21 LAB — URINALYSIS, COMPLETE W/RFL CULTURE
Bacteria, UA: NONE SEEN /HPF
Bilirubin Urine: NEGATIVE
Hgb urine dipstick: NEGATIVE
Hyaline Cast: NONE SEEN /LPF
Ketones, ur: NEGATIVE
Leukocyte Esterase: NEGATIVE
Nitrites, Initial: NEGATIVE
Protein, ur: NEGATIVE
RBC / HPF: NONE SEEN /HPF (ref 0–2)
Specific Gravity, Urine: 1.01 (ref 1.001–1.035)
WBC, UA: NONE SEEN /HPF (ref 0–5)
pH: 5 (ref 5.0–8.0)

## 2023-10-21 LAB — NO CULTURE INDICATED

## 2023-10-21 NOTE — Patient Instructions (Signed)
 Considering applying vaseline, aquaphor or coconut oil to the vulva after showers. You could also using daily vaginal moisturizers by brands like Good Clean Love and Ah! Yes.

## 2023-10-23 ENCOUNTER — Encounter: Payer: Self-pay | Admitting: Oncology

## 2023-10-23 ENCOUNTER — Other Ambulatory Visit: Payer: Self-pay

## 2023-10-27 ENCOUNTER — Other Ambulatory Visit (HOSPITAL_COMMUNITY): Payer: Self-pay

## 2023-10-27 ENCOUNTER — Other Ambulatory Visit: Payer: Self-pay

## 2023-10-27 ENCOUNTER — Telehealth: Payer: Self-pay | Admitting: Pharmacy Technician

## 2023-10-27 ENCOUNTER — Other Ambulatory Visit: Payer: Self-pay | Admitting: Physician Assistant

## 2023-10-27 DIAGNOSIS — F32A Depression, unspecified: Secondary | ICD-10-CM

## 2023-10-27 MED ORDER — BUPROPION HCL ER (XL) 150 MG PO TB24
150.0000 mg | ORAL_TABLET | Freq: Every morning | ORAL | 2 refills | Status: DC
  Filled 2023-11-02: qty 30, 30d supply, fill #0
  Filled 2023-12-07: qty 30, 30d supply, fill #1
  Filled 2024-01-11: qty 30, 30d supply, fill #2

## 2023-10-27 NOTE — Progress Notes (Signed)
   10/27/2023  Patient ID: Felicia [name], female   DOB: 24-Oct-1950, 73 y.o.   MRN: [id number]  Patient engaged with clinical pharmacist for management of diabetes on 10/09/2023. Outreach by [employer] technician was requested.   Outreached patient to discuss diabetes medication management. Left voicemail for patient to return my call at their convenience.   Arshad Oberholzer, CPhT [facility] Office: [phone] Email: [email or web address]

## 2023-10-28 ENCOUNTER — Other Ambulatory Visit: Payer: Self-pay

## 2023-10-28 ENCOUNTER — Telehealth: Payer: Self-pay | Admitting: Pharmacy Technician

## 2023-10-28 NOTE — Progress Notes (Signed)
   10/28/2023 Name: Felicia [name] MRN: [id number] DOB: 1950/10/29  Patient is appearing for a follow-up visit with the population health pharmacy technician. Last engaged with the clinical pharmacist to discuss diabetes and Medication Management on 10/09/2023. Contacted patient today to discuss diabetes.   Plan from last clinical pharmacist appointment: Diabetes: Currently uncontrolled - but improving with Lantus  titration and restarting Ozempic .  - Reviewed goal A1c, goal fasting, and goal 2 hour post prandial glucose - Recommend to continue Jardiance , glipizide   - Continue Lantus  from 58 units once a day - Continue Ozempic  0.5mg  weekly - would like to increase to 1mg  weekly but since she will have initial visit with endo in 5 days will defer to them.  - Recommend to check glucose 1 to 2 times a day a varying times of day.  - Discussed lower sugar / carbohydrate snack options - continue cottage cheese or sugar snap peas in place of cookies.     Medication Adherence Barriers Identified Patient made recommended medication changes per plan:  Patient saw Endo on 10/14/2023 and Dr. Mercie made changes to her diabetes regimen as follows: I) decrease Lantus  from 58 to 50 units daily at bedtime. II) increase Ozempic  from 0.5 to 1 mg weekly. III) continue glipizide  extended release 20 mg daily for now.  Consider decreasing in the future. IV) continue Jardiance  25 mg daily. Patient informs she has decreased Lantus  as above and increased Ozempic  to 1mg . She is tolerating the dose increase in Ozempic  well. No reported adverse effects. Access issues with any new medication or testing device: No Patient informs she has medication and Dexcom supplies on hand. Per Dr Annemarie fill history data, Ozempic   filled for 9ml on 9/26, Lantus  filled for 15ml on 9/26, Glipizide  XL 10mg  filled for 90 tabs (45 day supply) on 9/25 and Jardiance  filled for 90 tabs (90 day supply) on 9/11. Dexcom receiver and sensors filled on  10/1.  Patient is checking blood sugars as prescribed: Yes Patient informs she received the Dexcom receiver and sensors at the beginning of the month and has been using it since then.  Current CGM readings:  Date of Download: 10/28/2023 Average Glucose: 168 mg/dL Glucose Management Indicator: 7.3   Time in range-55% Very high -7% High-28% She report no Very Low or Lows.  Medication Adherence Barriers Addressed/Actions Taken:  Reviewed medication changes per plan from last clinical pharmacist note Educated patient to contact pharmacy regarding new prescriptions  Reviewed instructions for monitoring blood sugars at home and reminded patient to keep a written log to review with pharmacist Reminded patient of date/time of upcoming clinical pharmacist follow up and any upcoming PCP/specialists visits. Patient denies transportation barriers to the appointment. Yes  Next clinical pharmacist appointment is scheduled for: 11/11/2023  Kate Caddy, CPhT [facility] Office: [phone] Email: [email or web address]

## 2023-10-29 ENCOUNTER — Encounter (HOSPITAL_BASED_OUTPATIENT_CLINIC_OR_DEPARTMENT_OTHER): Payer: Self-pay | Admitting: Pulmonary Disease

## 2023-10-29 ENCOUNTER — Ambulatory Visit (HOSPITAL_BASED_OUTPATIENT_CLINIC_OR_DEPARTMENT_OTHER): Admitting: Pulmonary Disease

## 2023-10-29 ENCOUNTER — Other Ambulatory Visit (HOSPITAL_BASED_OUTPATIENT_CLINIC_OR_DEPARTMENT_OTHER): Payer: Self-pay

## 2023-10-29 VITALS — BP 108/65 | HR 81 | Ht 64.25 in | Wt 182.0 lb

## 2023-10-29 DIAGNOSIS — Z853 Personal history of malignant neoplasm of breast: Secondary | ICD-10-CM | POA: Diagnosis not present

## 2023-10-29 DIAGNOSIS — J040 Acute laryngitis: Secondary | ICD-10-CM

## 2023-10-29 DIAGNOSIS — J432 Centrilobular emphysema: Secondary | ICD-10-CM

## 2023-10-29 DIAGNOSIS — J439 Emphysema, unspecified: Secondary | ICD-10-CM

## 2023-10-29 MED ORDER — PREDNISONE 20 MG PO TABS
40.0000 mg | ORAL_TABLET | Freq: Every day | ORAL | 0 refills | Status: AC
  Filled 2023-10-29: qty 10, 5d supply, fill #0

## 2023-10-29 NOTE — Progress Notes (Signed)
 Subjective:   PATIENT ID: Felicia [name] GENDER: female DOB: Oct 27, 1950, MRN: [id number]   HPI  Chief Complaint  Patient presents with   Emphysema   Reason for Visit: Follow-up  [name] is a 73 year old female never smoker with left breast cancer s/p radiation, CAD, DM and HTN who presents for follow-up  Synopsis: 2022 S/p left breast radiation and developed SOB/ left chest pain. Emphysema on CT. Started on Advair . Self discontinued Advair  after improved sx however restarted in Nov but feels ineffective. Started on Spiriva  2023 Breo added on to Spiriva  in Jan for wheezing and lung pain which improved by 90%. Changed to [facility] in June due to hoarseness  05/03/22 Since our last visit she is not using inhalers regularly. Overall she feels well off inhalers except when triggered by environmental factors including pollen. Will intermittently use Stiolto as needed on her bad days when she has chest tightness. Never uses the albuterol  inhaler. No exacerbations requiring steroids or antibiotics.   02/03/23 Since our last visit she reports she is doing well overall. Has been doing well until the last three weeks when she started using albuterol  daily for cough and shortness of breath. No wheezing.  04/22/23 Since our last visit overall doing well. She is compliant with Stiolto daily. Not using albuterol  anymore. Denies cough, shortness of breath or wheezing. Seasonal changes will effect her. Pollen will trigger her. No exacerbations since our last visit  10/29/23 Since our last visit she has had intermittent hoarseness in the last week due to weather changes. Has been compliant with her Stiolto. Feels like she can't breath as well today associated with occasional wheezing. No fevers or chills. No exacerbations since our last visit.  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  Past Medical History:  Diagnosis Date   Adenomatous colon polyp 2015   Anemia    Anxiety    Breast cancer (HCC) 09/2019   left breast IMC   CAD (coronary artery disease)    a. 3/2007s/p DES to the LAD ([place] ); b. 01/2017 MV: EF 80%, small, mild apical ant defect w/o ischemia (felt to be breast atten). Low risk.   Depression    DM (diabetes mellitus) (HCC)    Emphysema lung (HCC)    Essential hypertension    GERD (gastroesophageal reflux disease)    Headache    secondary to neck surgery per patient   High triglycerides    HTN (hypertension)    Hyperlipidemia    Hypothyroid    OA (osteoarthritis)    right knee,hands   Overflow incontinence    Pneumonia 2003   PONV (postoperative nausea and vomiting)    s/p gallbladder    RLS (restless legs syndrome)    Urinary urgency    Uterine cancer (HCC) dx'd 2014     Family History  Problem Relation Age of Onset   AAA (abdominal aortic aneurysm) Mother    Thyroid  disease Mother    Colon cancer Mother    Diabetes Father    Alzheimer's disease Father    Arthritis Sister    Heart disease Sister    Throat cancer Sister    Kidney  failure Sister    Diabetes Sister    Diabetes Brother    Diabetes Brother    Breast cancer Cousin      Social History   Occupational History   Occupation: retired  Tobacco Use   Smoking status: Never   Smokeless tobacco: Never  Advertising account planner   Vaping status: Never Used  Substance and Sexual Activity   Alcohol use: Not Currently    Comment: OCCASIONALLY   Drug use: No   Sexual activity: Not Currently    Birth control/protection: Surgical    Comment: hyst    Allergies  Allergen Reactions   Chloraprep One Step [Chlorhexidine  Gluconate] Rash   Estrogens Other (See Comments)    PATIENT HAS A  HISTORY OF CANCER AND HAS BEEN TOLD TO NEVER TAKE ANYTHING CONTAINING ESTROGEN, AS IT MIGHT CAUSE A RECURRENCE  Other Reaction(s): bad for cancer    Metformin  And Related Diarrhea   Other Dermatitis   Shellfish Protein-Containing Drug Products Nausea And Vomiting   Shrimp [Shellfish Allergy] Nausea And Vomiting   Doxycycline  Itching and Rash     Outpatient Medications Prior to Visit  Medication Sig Dispense Refill   ACCU-CHEK GUIDE test strip USE TO check blood glucose UP TO four times daily AS DIRECTED 100 strip 0   Accu-Chek Softclix Lancets lancets USE TO check blood glucose UP TO four times daily AS DIRECTED 100 each 0   acetaminophen  (TYLENOL ) 500 MG tablet Take 500 mg by mouth every 6 (six) hours as needed for mild pain (pain score 1-3) or moderate pain (pain score 4-6).     albuterol  (VENTOLIN  HFA) 108 (90 Base) MCG/ACT inhaler Inhale 2 puffs into the lungs every 6 (six) hours as needed for wheezing or shortness of breath. 6.7 g 2   anastrozole  (ARIMIDEX ) 1 MG tablet Take 1 tablet (1 mg total) by mouth daily. 90 tablet 4   ascorbic acid (VITAMIN C) 500 MG tablet Take 500 mg by mouth daily.     aspirin  EC (ASPIRIN  81) 81 MG tablet Take 1 tablet (81 mg total) by mouth in the morning. 90 tablet 99   azelastine  (ASTELIN ) 0.1 % nasal spray Place 2 sprays into both nostrils 2 (two) times daily. Use in each nostril as directed 30 mL 11   bisoprolol  (ZEBETA ) 5 MG tablet Take 1 tablet (5 mg total) by mouth at bedtime. 90 tablet 3   blood glucose meter kit and supplies Dispense based on patient and insurance preference. Use up to four times daily as directed. (FOR ICD-10 E10.9, E11.9). 1 each 0   buPROPion  (WELLBUTRIN  XL) 150 MG 24 hr tablet Take 1 tablet (150 mg total) by mouth every morning for depression. 30 tablet 2   celecoxib  (CELEBREX ) 200 MG capsule Take 1 capsule (200 mg total) by mouth 2 (two) times daily. 180 capsule 1   CINNAMON PO Take 600 mg by mouth daily with supper.      clotrimazole -betamethasone  (LOTRISONE) cream Apply 1 Application topically daily as needed (Rash).     Continuous Glucose Receiver (DEXCOM G7 RECEIVER) DEVI Use to monitor glucose continuously. 1 each 0   Continuous Glucose Sensor (DEXCOM G7 SENSOR) MISC Change sensor every 10 days. 9 each 3   diclofenac  sodium (VOLTAREN ) 1 % GEL Apply 2-4 g topically 4 (four) times daily as needed (as directed for pain).     empagliflozin  (JARDIANCE ) 25 MG TABS tablet Take 1 tablet (25 mg total) by mouth daily. 90 tablet 1   escitalopram  (LEXAPRO ) 20  MG tablet Take 1 tablet (20 mg total) by mouth daily. 90 tablet 1   fenofibrate  (TRICOR ) 145 MG tablet Take 1 tablet (145 mg total) by mouth at bedtime. 90 tablet 3   Ferrous Sulfate (IRON PO) Take 1 tablet by mouth daily. alternates days:1 tablet one day and 2 tablets the next day     FIBER PO Take 1 tablet by mouth daily.     gabapentin  (NEURONTIN ) 600 MG tablet Take 1 tablet (600 mg total) by mouth 3 (three) times daily. 90 tablet 5   glipiZIDE  (GLUCOTROL  XL) 10 MG 24 hr tablet Take 2 tablets (20 mg total) by mouth every morning. 90 tablet 1   glucose blood test strip Use to check blood sugar up to four times daily as directed 100 each 3   HYDROcodone -acetaminophen  (NORCO/VICODIN) 5-325 MG tablet Take 1 tablet by mouth every 6 (six) hours as needed. 30 tablet 0   Incontinence Supply Disposable (DEPEND ADJUSTABLE UNDERWEAR) MISC 1 Application by Does not apply route daily. 16 each 11   insulin  glargine (LANTUS  SOLOSTAR) 100 UNIT/ML Solostar Pen Start with injecting 4 units under the skin in the evenings. Increase by 2 units every 2 days until monring fasting glucose is to goal (140). Max units 60 per day. (Patient taking differently: 58 Units daily. Start with injecting 4 units under the skin in the evenings. Increase by 2 units every 2 days until monring fasting glucose is to goal (140). Max units 60 per day.) 15 mL 11   Insulin  Pen Needle (INSUPEN PEN NEEDLES) 32G X  4 MM MISC Use to inject insulin  as directed 100 each 3   ketoconazole  (NIZORAL ) 2 % cream Apply 1 Application topically daily. 60 g 0   Lancets MISC Use to check blood sugar up to four times daily as directed 100 each 3   levothyroxine  (SYNTHROID ) 125 MCG tablet Take 1 tablet (125 mcg total) by mouth daily before breakfast. 30 tablet 2   loratadine  (ALLERGY RELIEF) 10 MG tablet Take 1 tablet (10 mg total) by mouth every evening. 90 tablet 3   magnesium  gluconate (MAGONATE) 500 (27 Mg) MG TABS tablet Take 500 mg by mouth at bedtime.     Multiple Vitamins-Calcium  (ONE-A-DAY WOMENS PO) Take 1 tablet by mouth daily.     mupirocin  cream (BACTROBAN ) 2 % Apply 1 Application topically 3 (three) times daily. 15 g 0   nitroGLYCERIN  (NITROSTAT ) 0.4 MG SL tablet Dissolve 1 tablet under the tongue every 5 minutes as needed for chest pain. Max of 3 doses, then 911. 75 tablet 2   nortriptyline  (PAMELOR ) 10 MG capsule Take 1 capsule (10 mg total) by mouth at bedtime. 30 capsule 5   omeprazole  (PRILOSEC) 40 MG capsule Take 1 capsule (40 mg total) by mouth every evening. 90 capsule 1   polyethylene glycol powder (MIRALAX ) 17 GM/SCOOP powder Take 17 g by mouth daily. 255 g 11   Potassium 99 MG TABS Take 99 mg by mouth at bedtime.     rOPINIRole  (REQUIP ) 1 MG tablet Take 1 tablet (1 mg total) by mouth at bedtime. 90 tablet 0   rosuvastatin  (CRESTOR ) 40 MG tablet Take 1 tablet (40 mg total) by mouth daily. 90 tablet 3   Semaglutide , 1 MG/DOSE, 4 MG/3ML SOPN Inject 1 mg as directed once a week. 9 mL 3   Tiotropium Bromide-Olodaterol (STIOLTO RESPIMAT ) 2.5-2.5 MCG/ACT AERS Inhale 2 puffs into the lungs daily. 4 g 11   tiZANidine  (ZANAFLEX ) 2 MG tablet Take  1 tablet (2 mg total) by mouth 3 (three) times daily. 90 tablet 5   triamcinolone  (KENALOG ) 0.1 % paste Apply a small amount to affected area twice daily for 2 - 3 weeks. 5 g 1   TURMERIC PO Take 450 mg by mouth daily with supper.     sulfamethoxazole -trimethoprim   (BACTRIM  DS) 800-160 MG tablet Take 1 tablet by mouth 2 (two) times daily. (Patient not taking: Reported on 10/29/2023) 14 tablet 0   No facility-administered medications prior to visit.    Review of Systems  Constitutional:  Negative for chills, diaphoresis, fever, malaise/fatigue and weight loss.  HENT:  Negative for congestion.        Hoarseness  Respiratory:  Positive for shortness of breath and wheezing. Negative for cough, hemoptysis and sputum production.   Cardiovascular:  Negative for chest pain, palpitations and leg swelling.     Objective:   Vitals:   10/29/23 1522  BP: 108/65  Pulse: 81  SpO2: 95%  Weight: 182 lb (82.6 kg)  Height: 5' 4.25 (1.632 m)    SpO2: 95 %  Physical Exam: General: Well-appearing, no acute distress HENT: [place], AT, hoarseness Eyes: EOMI, no scleral icterus Respiratory: Clear to auscultation bilaterally.  No crackles, wheezing or rales Cardiovascular: RRR, -M/R/G, no JVD Extremities:-Edema,-tenderness Neuro: AAO x4, CNII-XII grossly intact Psych: Normal mood, normal affect  Data Reviewed:  Imaging: CTA 04/29/20 - No pulmonary emboli. Dependent bibasilar atelectasis. Diffuse emphysema with mosaic attenuation CT Chest 01/11/21 - Multifocal reticulonodular opacities CT CAP 08/09/21 - Visualized parenchymal resolution of opacities with minimal subpleural radiation fibrosis in LUL  PFT: 08/10/20 FVC 2.61 (87%) FEV1 2.39 (105%) Ratio 87  TLC 88% DLCO 117% Interpretation: Normal spirometry. Increased DLCO suggestive of asthma No significant bronchodilator response  Labs: CBC    Component Value Date/Time   WBC 20.9 (H) 08/01/2023 1831   RBC 4.71 08/01/2023 1831   HGB 14.5 08/01/2023 1831   HGB 13.8 08/02/2021 1036   HCT 45.0 08/01/2023 1831   PLT 302 08/01/2023 1831   PLT 216 08/02/2021 1036   MCV 95.5 08/01/2023 1831   MCH 30.8 08/01/2023 1831   MCHC 32.2 08/01/2023 1831   RDW 14.8 08/01/2023 1831   LYMPHSABS 9.7 (H) 08/01/2023  0740   MONOABS 0.2 08/01/2023 0740   EOSABS 0.8 (H) 08/01/2023 0740   BASOSABS 0.0 08/01/2023 0740   Echocardiogram 11/27/20 EF 60-65%. No valvular or WMA  Assessment & Plan:   Discussion: 73 year old female never smoker with hx CAD s/p stent to LAD, hx breast cancer s/p left sided radiation 04/2020 who presents for follow-up for emphysema +/- occupational asthma +/- radiation pneumonitis. Symptoms seasonally worsen. Improved on LAMA/LABA but today with laryngitis. Discussed clinical course and management of COPD including bronchodilator regimen, preventive care including vaccinations and action plan for exacerbation.  ICS - hoarseness Stiolto - no issues but d/c'd when symptoms improved  Emphysema - improved respiratory symptoms on LAMA/LABA Laryngitis - new problem --Prednisone  20 mg daily x 5 days --CONTINUE Stiolto TWO puffs ONCE a day.  --CONTINUE Albuterol  TWO puffs AS NEEDED for shortness of breath or wheezing.  --CONTINUE mucinex as needed for chest congestion --OK for humidified air as needed   Health Maintenance Immunization History  Administered Date(s) Administered   Fluad Quad(high Dose 65+) 09/25/2021   Fluad Trivalent(High Dose 65+) 10/23/2022   INFLUENZA, HIGH DOSE SEASONAL PF 12/31/2016, 10/24/2017   PNEUMOCOCCAL CONJUGATE-20 09/25/2021   Pneumococcal Conjugate-13 10/24/2017   Td 07/12/2005, 02/08/2019  CT Lung Screen - not indicated  No orders of the defined types were placed in this encounter.  Meds ordered this encounter  Medications   predniSONE  (DELTASONE ) 20 MG tablet    Sig: Take 2 tablets (40 mg total) by mouth daily with breakfast for 5 days.    Dispense:  10 tablet    Refill:  0   Return in about 6 months (around 04/28/2024).   I have spent a total time of 30-minutes on the day of the appointment including chart review, data review, collecting history, coordinating care and discussing medical diagnosis and plan with the patient/family. Past  medical history, allergies, medications were reviewed. Pertinent imaging, labs and tests included in this note have been reviewed and interpreted independently by me.  Rommel Hogston Slater Staff, MD [facility] 10/29/2023

## 2023-10-29 NOTE — Patient Instructions (Signed)
  Emphysema - improved respiratory symptoms on LAMA/LABA Laryngitis --Prednisone  20 mg daily x 5 days --CONTINUE Stiolto TWO puffs ONCE a day.  --CONTINUE Albuterol  TWO puffs AS NEEDED for shortness of breath or wheezing.  --CONTINUE mucinex as needed for chest congestion --OK for humidified air as needed

## 2023-10-31 ENCOUNTER — Other Ambulatory Visit (HOSPITAL_COMMUNITY): Payer: Self-pay

## 2023-10-31 ENCOUNTER — Encounter (HOSPITAL_BASED_OUTPATIENT_CLINIC_OR_DEPARTMENT_OTHER): Payer: Self-pay

## 2023-10-31 ENCOUNTER — Other Ambulatory Visit (HOSPITAL_BASED_OUTPATIENT_CLINIC_OR_DEPARTMENT_OTHER): Payer: Self-pay

## 2023-10-31 ENCOUNTER — Other Ambulatory Visit: Payer: Self-pay

## 2023-10-31 MED ORDER — BISOPROLOL FUMARATE 5 MG PO TABS
5.0000 mg | ORAL_TABLET | Freq: Every day | ORAL | 3 refills | Status: DC
  Filled 2023-10-31 – 2023-11-03 (×2): qty 90, 90d supply, fill #0

## 2023-10-31 NOTE — Telephone Encounter (Signed)
 Will send message to Dr. Mona, who saw patient for lipid clinic in May.

## 2023-11-03 ENCOUNTER — Other Ambulatory Visit (HOSPITAL_BASED_OUTPATIENT_CLINIC_OR_DEPARTMENT_OTHER): Payer: Self-pay

## 2023-11-03 ENCOUNTER — Other Ambulatory Visit: Payer: Self-pay

## 2023-11-03 ENCOUNTER — Other Ambulatory Visit (HOSPITAL_COMMUNITY): Payer: Self-pay

## 2023-11-03 ENCOUNTER — Encounter: Payer: Self-pay | Admitting: Oncology

## 2023-11-03 DIAGNOSIS — T81328A Disruption or dehiscence of closure of other specified internal operation (surgical) wound, initial encounter: Secondary | ICD-10-CM | POA: Diagnosis not present

## 2023-11-08 ENCOUNTER — Other Ambulatory Visit (HOSPITAL_COMMUNITY): Payer: Self-pay

## 2023-11-11 ENCOUNTER — Other Ambulatory Visit (HOSPITAL_COMMUNITY): Payer: Self-pay

## 2023-11-11 ENCOUNTER — Encounter: Payer: Self-pay | Admitting: Oncology

## 2023-11-11 ENCOUNTER — Other Ambulatory Visit: Payer: Self-pay

## 2023-11-11 ENCOUNTER — Other Ambulatory Visit: Payer: Self-pay | Admitting: Pharmacist

## 2023-11-11 ENCOUNTER — Other Ambulatory Visit: Payer: Self-pay | Admitting: Physician Assistant

## 2023-11-11 MED ORDER — ASPIRIN 81 MG PO TBEC
81.0000 mg | DELAYED_RELEASE_TABLET | Freq: Every morning | ORAL | 3 refills | Status: AC
  Filled 2023-11-11: qty 90, 90d supply, fill #0
  Filled 2024-02-02 – 2024-02-14 (×3): qty 90, 90d supply, fill #1

## 2023-11-11 NOTE — Progress Notes (Signed)
 11/11/23 Name: Felicia [name] MRN: [id number] DOB: [birth year]  Chief Complaint  Patient presents with   Diabetes   Medication Management    Felicia [name] is a 73 y.o. year old female who presented for a telephone visit.   They were referred to the pharmacist by their PCP for assistance in managing diabetes, complex medication management, and frequent falls.   Subjective:  Care Team: Primary Care Provider: Allwardt, Felicia HERO, PA-C ; Next Scheduled Visit: 12/25/2023 Endocrinologist Dr Mercie; Next Scheduled Visit: 01/14/2024 Neurologist: Skeet; Next Scheduled Visit: - 09/21/2024  Cardiology: Dr Lonni; next scheduled visit: 12/29/2023 Oncology: Dr Odean: Next Scheduled Visit: 09/21/2024  Medication Access/Adherence  Current Pharmacy:  [name] - [facility] [address] [place] [address] Phone: [phone] Fax: [phone]  [name] [address] [place] [address] Phone: [phone] Fax: [phone]  [facility] [address] [place] [address] Phone: [phone] Fax: [phone]   Patient reports affordability concerns with their medications: No  Patient reports access/transportation concerns to their pharmacy: No  Patient reports adherence concerns with their medications:  No      Diabetes:  Current medications:  Jardiance  25mg  daily, glipizide  10mg  - take 2 tablets every morning, Lantus  50 units once a day per Dr Eugenio last note but patient decreased to 48 units. Ozempic  1 mg weekly (restarted Ozempic  around 08/03/2023)  Medications tried in the past: Trulicity  - stopped because she had trouble getting it; Mounjaro  - chart states she stopped due to diarrhea and night sweats but patient did not remember taking Mounjaro ; metformin  - stopped because suspected was worsening diarrhea (fecal incontinence)   Current glucose readings: 93,  109, 119, 176  Reports GMI is 7.4%  Using Dex Com G7 since she met with Dr Mercie and this time she has not had any issues with sensors staying on.  She is using DexCom reader.  Patient reports no hypoglycemic s/sx including no dizziness, shakiness, sweating once in the last 4 weeks  Patient denies hyperglycemic symptoms including no polyuria, polydipsia, polyphagia, nocturia, neuropathy, blurred vision.  Diet:  Eating some vegetables - Brussels sprouts, broccoli,  Snacks - tried cottage cheese, Sugar Snap peas Sometimes cookies Drinks - diet soda or water .   Current medication access support: none   Macrovascular and Microvascular Risk Reduction:  Statin? yes (rosuvastatin  40mg ); ACEi/ARB? No due to low blood pressure  Last urinary albumin/creatinine ratio:  Lab Results  Component Value Date   MICRALBCREAT 20.6 03/18/2023   Last eye exam:  Lab Results  Component Value Date   HMDIABEYEEXA No Retinopathy 04/24/2022   Last foot exam: 10/23/2022 Tobacco Use:  Tobacco Use: Low Risk  (10/29/2023)   Patient History    Smoking Tobacco Use: Never    Smokeless Tobacco Use: Never    Passive Exposure: Not on file     Hypertension:  Current medications: bisoprolol  5mg  once at bedtime  Patient denies hypotensive s/sx including no dizziness, lightheadedness.  Patient denies hypertensive symptoms including no headache, chest pain, shortness of breath  Hyperlipidemia:  Last LDL was 199 She restarted rosuvastatin  40mg  daily 07/23/2023.  Patient report she is tolerating rosuvastatin  without any side effects.   Objective:  BP Readings from Last 3 Encounters:  10/29/23 108/65  10/21/23 96/62  10/14/23 118/62     Lab Results  Component Value Date   HGBA1C 9.2 (H) 09/25/2023    Lab Results  Component Value Date  CREATININE 1.02 (H) 08/01/2023   BUN 21 08/01/2023   NA 135 08/01/2023   K 4.3 08/01/2023   CL 101 08/01/2023   CO2 21 (L) 08/01/2023    Lab Results   Component Value Date   CHOL 281 (H) 11/14/2022   HDL 46 11/14/2022   LDLCALC 161 (H) 11/14/2022   TRIG 389 (H) 11/14/2022   CHOLHDL 6.1 (H) 11/14/2022    Medications Reviewed Today     Reviewed by Carla Milling, RPH-CPP (Pharmacist) on 11/11/23 at 1334  Med List Status: <None>   Medication Order Taking? Sig Documenting Provider Last Dose Status Informant  ACCU-CHEK GUIDE test strip [id number]  USE TO check blood glucose UP TO four times daily AS DIRECTED  Patient not taking: Reported on 11/11/2023   [name], Felicia HERO, PA-C  Active Self  Accu-Chek Softclix Lancets lancets [id number]  USE TO check blood glucose UP TO four times daily AS DIRECTED  Patient not taking: Reported on 11/11/2023   [name], Felicia HERO, PA-C  Active Self  acetaminophen  (TYLENOL ) 500 MG tablet [id number] Yes Take 500 mg by mouth every 6 (six) hours as needed for mild pain (pain score 1-3) or moderate pain (pain score 4-6). [provider]  Active Self  albuterol  (VENTOLIN  HFA) 108 (90 Base) MCG/ACT inhaler [id number] Yes Inhale 2 puffs into the lungs every 6 (six) hours as needed for wheezing or shortness of breath. Kassie Acquanetta Bradley, MD  Active Self  anastrozole  (ARIMIDEX ) 1 MG tablet [id number] Yes Take 1 tablet (1 mg total) by mouth daily. Gudena, Vinay, MD  Active   ascorbic acid (VITAMIN C) 500 MG tablet [id number]  Take 500 mg by mouth daily. [provider]  Active Self  aspirin  EC (ASPIRIN  LOW DOSE) 81 MG tablet [id number] Yes Take 1 tablet (81 mg total) by mouth in the morning. [name], Felicia HERO, PA-C  Active   azelastine  (ASTELIN ) 0.1 % nasal spray [id number]  Place 2 sprays into both nostrils 2 (two) times daily. Use in each nostril as directed [name], Alyssa M, PA-C  Active Self  bisoprolol  (ZEBETA ) 5 MG tablet [id number] Yes Take 1 tablet (5 mg total) by mouth at bedtime. Mona Vinie BROCKS, MD  Active   blood glucose meter kit and supplies [id number]  Dispense based on patient and insurance  preference. Use up to four times daily as directed. (FOR ICD-10 E10.9, E11.9). [name], Felicia HERO, PA-C  Active Self  buPROPion  (WELLBUTRIN  XL) 150 MG 24 hr tablet [id number] Yes Take 1 tablet (150 mg total) by mouth every morning for depression. [name], Alyssa M, PA-C  Active   celecoxib  (CELEBREX ) 200 MG capsule [id number] Yes Take 1 capsule (200 mg total) by mouth 2 (two) times daily. [name], Alyssa M, PA-C  Active   CINNAMON PO [id number]  Take 600 mg by mouth daily with supper. [provider]  Active Self  clotrimazole -betamethasone  (LOTRISONE) cream [id number]  Apply 1 Application topically daily as needed (Rash). [provider]  Active Self  Continuous Glucose Receiver (DEXCOM G7 RECEIVER) DEVI [id number]  Use to monitor glucose continuously. Thapa, Sudan, MD  Active   Continuous Glucose Sensor (DEXCOM G7 SENSOR) MISC [id number] Yes Change sensor every 10 days. Thapa, Sudan, MD  Active   diclofenac  sodium (VOLTAREN ) 1 % GEL [id number]  Apply 2-4 g topically 4 (four) times daily as needed (as directed for pain). [provider]  Active Self  empagliflozin  (JARDIANCE ) 25 MG TABS tablet [id number] Yes Take 1 tablet (25 mg total) by mouth  daily. [name], Alyssa M, PA-C  Active   escitalopram  (LEXAPRO ) 20 MG tablet [id number] Yes Take 1 tablet (20 mg total) by mouth daily. [name], Felicia HERO, PA-C  Active Self  fenofibrate  (TRICOR ) 145 MG tablet [id number] Yes Take 1 tablet (145 mg total) by mouth at bedtime. [name], Felicia HERO, PA-C  Active   Ferrous Sulfate (IRON PO) [id number]  Take 1 tablet by mouth daily. alternates days:1 tablet one day and 2 tablets the next day [provider]  Active Self  FIBER PO [id number]  Take 1 tablet by mouth daily. [provider]  Active Self  gabapentin  (NEURONTIN ) 600 MG tablet [id number] Yes Take 1 tablet (600 mg total) by mouth 3 (three) times daily. Skeet Juliene SAUNDERS, DO  Active   glipiZIDE  (GLUCOTROL  XL) 10 MG 24 hr tablet  [id number]  Take 2 tablets (20 mg total) by mouth every morning. [name], Felicia HERO, PA-C  Active   glucose blood test strip [id number]  Use to check blood sugar up to four times daily as directed [name], Alyssa M, PA-C  Active Self  HYDROcodone -acetaminophen  (NORCO/VICODIN) 5-325 MG tablet [id number]  Take 1 tablet by mouth every 6 (six) hours as needed. Gerome Maurilio HERO, PA-C  Active   Incontinence Supply Disposable (DEPEND ADJUSTABLE UNDERWEAR) MISC [id number]  1 Application by Does not apply route daily. [name], Felicia HERO, PA-C  Active Self  insulin  glargine (LANTUS  SOLOSTAR) 100 UNIT/ML Solostar Pen [id number] Yes Start with injecting 4 units under the skin in the evenings. Increase by 2 units every 2 days until monring fasting glucose is to goal (140). Max units 60 per day.  Patient taking differently: 48 Units daily. Start with injecting 4 units under the skin in the evenings. Increase by 2 units every 2 days until monring fasting glucose is to goal (140). Max units 60 per day.   [name], Felicia HERO, PA-C  Active Self  Insulin  Pen Needle (INSUPEN PEN NEEDLES) 32G X 4 MM MISC [id number]  Use to inject insulin  as directed [name], Felicia HERO, PA-C  Active Self  ketoconazole  (NIZORAL ) 2 % cream [id number]  Apply 1 Application topically daily. [name], Felicia HERO, PA-C  Active Self  Lancets MISC [id number]  Use to check blood sugar up to four times daily as directed [name], Felicia HERO, PA-C  Active Self  levothyroxine  (SYNTHROID ) 125 MCG tablet [id number]  Take 1 tablet (125 mcg total) by mouth daily before breakfast. [name], Alyssa M, PA-C  Active   loratadine  (ALLERGY RELIEF) 10 MG tablet [id number]  Take 1 tablet (10 mg total) by mouth every evening. [name], Felicia HERO, PA-C  Active Self  magnesium  gluconate (MAGONATE) 500 (27 Mg) MG TABS tablet [id number]  Take 500 mg by mouth at bedtime. [provider]  Active Self  Multiple Vitamins-Calcium  (ONE-A-DAY WOMENS PO) [id number]  Take 1 tablet by  mouth daily. [provider]  Active Self  mupirocin  cream (BACTROBAN ) 2 % [id number]  Apply 1 Application topically 3 (three) times daily. Zamora, Erin R, NP  Active   nitroGLYCERIN  (NITROSTAT ) 0.4 MG SL tablet [id number]  Dissolve 1 tablet under the tongue every 5 minutes as needed for chest pain. Max of 3 doses, then 911. Nahser, Aleene PARAS, MD  Active Self  nortriptyline  (PAMELOR ) 10 MG capsule [id number]  Take 1 capsule (10 mg total) by mouth at bedtime. Skeet Juliene SAUNDERS, DO  Active   omeprazole  (PRILOSEC) 40 MG capsule [id number] Yes Take 1 capsule (40 mg total) by mouth every evening. [name], Felicia HERO,  PA-C  Active   polyethylene glycol powder (MIRALAX ) 17 GM/SCOOP powder [id number]  Take 17 g by mouth daily. Craig Alan SAUNDERS, [place]  Active Self  Potassium 99 MG TABS [id number]  Take 99 mg by mouth at bedtime. [provider]  Active Self  rOPINIRole  (REQUIP ) 1 MG tablet [id number] Yes Take 1 tablet (1 mg total) by mouth at bedtime. Corey, Evan S, MD  Active   rosuvastatin  (CRESTOR ) 40 MG tablet [id number]  Take 1 tablet (40 mg total) by mouth daily. Mona Vinie BROCKS, MD  Active Self  Semaglutide , 1 MG/DOSE, 4 MG/3ML SOPN [id number] Yes Inject 1 mg as directed once a week. Thapa, [place], MD  Active   sulfamethoxazole -trimethoprim  (BACTRIM  DS) 800-160 MG tablet [id number]  Take 1 tablet by mouth 2 (two) times daily.  Patient not taking: Reported on 11/11/2023   Jule Ronal CROME, PA-C  Consider Medication Status and Discontinue (Completed Course)   Tiotropium Bromide-Olodaterol (STIOLTO RESPIMAT ) 2.5-2.5 MCG/ACT AERS [id number] Yes Inhale 2 puffs into the lungs daily. Kassie Acquanetta Bradley, MD  Active Self  tiZANidine  (ZANAFLEX ) 2 MG tablet [id number]  Take 1 tablet (2 mg total) by mouth 3 (three) times daily. Skeet Juliene SAUNDERS, DO  Active   triamcinolone  (KENALOG ) 0.1 % paste [id number]  Apply a small amount to affected area twice daily for 2 - 3 weeks.   Active Self  TURMERIC PO [id number]  Take 450  mg by mouth daily with supper. [provider]  Active Self  Med List Note Christie Alyson Sola 07/31/23 9041): [place]              Assessment/Plan:   Diabetes: Currently uncontrolled - but improving with Lantus  titration and restarting Ozempic .  - Reviewed goal A1c, goal fasting, and goal 2 hour post prandial glucose - Recommend to continue Jardiance , glipizide   - Continue Lantus  from 48 units once a day. Provided education about long acting insulin .  - Continue Ozempic  1mg  weekly   - Recommend to check glucose 1 to 2 times a day a varying times of day.   Hypertension:Currently controlled - Reviewed long term cardiovascular and renal outcomes of uncontrolled blood pressure - Recommended to check home blood pressure and heart rate when she feels lightheaded / dizzy - Recommend to continue bisoprolol  (could consider ACEI or ARB in the future)    Hyperlipidemia  -continue rosuvastatin  40mg  daily - due to recheck lipids  Medication Management:  - Discussed yearly diabetic eye exam. Reminded patient to make appointment - Discussed flu vaccine - patient is planning to get when she is in the office 12/25/2023   Follow Up Plan: 4 to 6 weeks  Madelin Ray, PharmD Clinical Pharmacist [facility]  Population Health [phone]

## 2023-11-16 ENCOUNTER — Other Ambulatory Visit: Payer: Self-pay | Admitting: Family Medicine

## 2023-11-17 ENCOUNTER — Other Ambulatory Visit: Payer: Self-pay

## 2023-11-17 ENCOUNTER — Other Ambulatory Visit (HOSPITAL_COMMUNITY): Payer: Self-pay

## 2023-11-17 ENCOUNTER — Encounter: Payer: Self-pay | Admitting: Radiology

## 2023-11-17 MED ORDER — ROPINIROLE HCL 1 MG PO TABS
1.0000 mg | ORAL_TABLET | Freq: Every day | ORAL | 0 refills | Status: DC
  Filled 2023-11-17: qty 90, 90d supply, fill #0

## 2023-11-17 NOTE — Telephone Encounter (Signed)
 Last OV 05/26/23 Next OV not scheduled  Last refill 05/26/23 Qty #90/0

## 2023-11-23 ENCOUNTER — Encounter: Payer: Self-pay | Admitting: Oncology

## 2023-11-23 ENCOUNTER — Other Ambulatory Visit (HOSPITAL_COMMUNITY): Payer: Self-pay

## 2023-11-25 ENCOUNTER — Other Ambulatory Visit: Payer: Self-pay

## 2023-12-02 ENCOUNTER — Other Ambulatory Visit: Payer: Self-pay | Admitting: Physician Assistant

## 2023-12-02 ENCOUNTER — Other Ambulatory Visit: Payer: Self-pay

## 2023-12-02 MED ORDER — LEVOTHYROXINE SODIUM 125 MCG PO TABS
125.0000 ug | ORAL_TABLET | Freq: Every day | ORAL | 2 refills | Status: AC
  Filled 2023-12-02: qty 30, 30d supply, fill #0
  Filled 2023-12-28: qty 30, 30d supply, fill #1
  Filled 2024-01-25: qty 30, 30d supply, fill #2

## 2023-12-08 ENCOUNTER — Ambulatory Visit: Attending: General Surgery

## 2023-12-08 ENCOUNTER — Other Ambulatory Visit (HOSPITAL_COMMUNITY): Payer: Self-pay

## 2023-12-08 VITALS — Wt 182.5 lb

## 2023-12-08 DIAGNOSIS — Z483 Aftercare following surgery for neoplasm: Secondary | ICD-10-CM | POA: Insufficient documentation

## 2023-12-08 NOTE — Therapy (Signed)
 OUTPATIENT PHYSICAL THERAPY SOZO SCREENING NOTE   Patient Name: Felicia [name] MRN: [id number] DOB:08-13-1950, 73 y.o., female Today's Date: 12/08/2023  PCP: Kathrene Mardy HERO, PA-C REFERRING PROVIDER: Ebbie Cough, MD   PT End of Session - 12/08/23 1605     Visit Number 6   # unchanged due to screen only   PT Start Time 1604    PT Stop Time 1608    PT Time Calculation (min) 4 min    Activity Tolerance Patient tolerated treatment well    Behavior During Therapy [facility] for tasks assessed/performed          Past Medical History:  Diagnosis Date   Adenomatous colon polyp 2015   Anemia    Anxiety    Breast cancer (HCC) 09/2019   left breast IMC   CAD (coronary artery disease)    a. 3/2007s/p DES to the LAD ([place] ); b. 01/2017 MV: EF 80%, small, mild apical ant defect w/o ischemia (felt to be breast atten). Low risk.   Depression    DM (diabetes mellitus) (HCC)    Emphysema lung (HCC)    Essential hypertension    GERD (gastroesophageal reflux disease)    Headache    secondary to neck surgery per patient   High triglycerides    HTN (hypertension)    Hyperlipidemia    Hypothyroid    OA (osteoarthritis)    right knee,hands   Overflow incontinence    Pneumonia 2003   PONV (postoperative nausea and vomiting)    s/p gallbladder    RLS (restless legs syndrome)    Urinary urgency    Uterine cancer (HCC) dx'd 2014   Past Surgical History:  Procedure Laterality Date   BREAST LUMPECTOMY WITH RADIOACTIVE SEED AND SENTINEL LYMPH NODE BIOPSY Left 10/07/2019   Procedure: LEFT BREAST LUMPECTOMY WITH RADIOACTIVE SEED AND SENTINEL LYMPH NODE BIOPSY;  Surgeon: Ebbie Cough, MD;  Location: [facility];  Service: General;  Laterality: Left;   CHOLECYSTECTOMY     COLONOSCOPY     CORONARY STENT PLACEMENT     CORONARY/GRAFT ACUTE MI REVASCULARIZATION     FINGER ARTHRODESIS Right 06/11/2019   Procedure: ARTHRODESIS INDEX FINGER DISTAL PHALANGEAL JOINT;   Surgeon: Murrell Drivers, MD;  Location: [facility];  Service: Orthopedics;  Laterality: Right;   LEFT HEART CATH AND CORONARY ANGIOGRAPHY N/A 02/16/2018   Procedure: LEFT HEART CATH AND CORONARY ANGIOGRAPHY;  Surgeon: Court Dorn PARAS, MD;  Location: [facility];  Service: Cardiovascular;  Laterality: N/A;   LEFT HEART CATH AND CORONARY ANGIOGRAPHY N/A 05/01/2020   Procedure: LEFT HEART CATH AND CORONARY ANGIOGRAPHY;  Surgeon: Court Dorn PARAS, MD;  Location: [facility];  Service: Cardiovascular;  Laterality: N/A;   ORIF ANKLE FRACTURE Right 08/01/2023   Procedure: OPEN REDUCTION INTERNAL FIXATION (ORIF) ANKLE FRACTURE, RIGHT;  Surgeon: Harden Jerona GAILS, MD;  Location: [facility];  Service: Orthopedics;  Laterality: Right;  RIGHT ORIF DISTAL FIBULA FRACTURE   PORTACATH PLACEMENT Right 11/23/2019   Procedure: INSERTION PORT-A-CATH WITH ULTRASOUND GUIDANCE;  Surgeon: Ebbie Cough, MD;  Location: [facility];  Service: General;  Laterality: Right;   POSTERIOR FUSION CERVICAL SPINE     C3-C7   REPLACEMENT TOTAL KNEE Right    SPINE SURGERY     TOTAL ABDOMINAL HYSTERECTOMY     FOR UTERUS CANCER   TOTAL HIP ARTHROPLASTY Left 2015   Patient Active Problem List   Diagnosis Date Noted   Osteopenia 09/04/2023   Closed  fracture of right ankle 08/01/2023   Ankle fracture 08/01/2023   Disorder associated with type 2 diabetes mellitus (HCC) 06/20/2023   Restless leg syndrome 05/26/2023   Referred otalgia of left ear 11/19/2022   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   Erosive osteoarthritis of multiple sites 09/26/2021   Frequent headaches 05/31/2021   Bilateral impacted cerumen 02/22/2021   Nasal septal perforation 02/22/2021   Postnasal drip 02/22/2021   Hoarseness 01/25/2021   Hyperglycemia due to type 2 diabetes mellitus (HCC) 01/18/2021   Mixed simple and mucopurulent chronic bronchitis (HCC) 12/27/2020    Centrilobular emphysema (HCC) 07/31/2020   Shortness of breath 07/31/2020   Tongue lesion 04/30/2020   Hypothyroid    Anxiety    Anxiety and depression    Port-A-Cath in place 11/24/2019   Hepatic steatosis 09/28/2019   Malignant neoplasm of upper-outer quadrant of left breast in female, estrogen receptor positive (HCC) 09/02/2019   Trigger index finger of right hand 05/05/2019   Primary osteoarthritis of first carpometacarpal joint of right hand 05/05/2019   Osteoarthritis of finger of right hand 05/05/2019   Endometrial cancer (HCC) 10/23/2018   Cirrhosis of liver without ascites (HCC) 10/23/2018   Diarrhea 10/23/2018   Chest pain 02/15/2018   Coronary artery disease involving native coronary artery of native heart without angina pectoris 11/18/2017   Hyperlipidemia LDL goal <70 11/18/2017   Diabetes mellitus treated with insulin  and oral medication (HCC) 11/18/2017   Dizziness 11/18/2017   Type 2 diabetes mellitus without complication, without long-term current use of insulin  (HCC) 11/18/2017    REFERRING DIAG: left breast cancer at risk for lymphedema  THERAPY DIAG:  Aftercare following surgery for neoplasm  PERTINENT HISTORY:  Patient was diagnosed on 08/03/2019 with 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 She underwent a left lumpectomy and 1 positive node removed on 10/07/2019. Ki67 is 20%. She has a history of uterine cancer in 2014 treated with hysterectomy and radiation, a left hip replacement, right knee replacement, and a C4-C7 fusion in 2014. Diabetes, history of breast cancer (LEFT), history of endometrial cancer (with history of radiation), spine surgery C3-C7 fusion 2017, heart disease, L THA 2015, history of 3 right knee scopes and right TKA, history of inner ear issue (required epley maneuver),   PRECAUTIONS: left UE Lymphedema risk,   SUBJECTIVE: Pt returns for her last monthly SOZO screen.   PAIN:  Are you having pain? No  SOZO  SCREENING: Patient was assessed today using the SOZO machine to determine the lymphedema index score. This was compared to her baseline score. It was determined that she is within the recommended range when compared to her baseline and no further action is needed at this time. She will continue SOZO screenings. These are done every 3 months for 2 years post operatively followed by every 6 months for 2 years, and then annually.  L-DEX FLOWSHEETS - 12/08/23 1600       L-DEX LYMPHEDEMA SCREENING   Measurement Type Unilateral    L-DEX MEASUREMENT EXTREMITY Upper Extremity    POSITION  Standing    DOMINANT SIDE Right    At Risk Side Left    BASELINE SCORE (UNILATERAL) 3.3    L-DEX SCORE (UNILATERAL) 7.5    VALUE CHANGE (UNILAT) 4.2           P: Pt is improved WNLs so will transition to annual.   Aden Berwyn Caldron, PTA 12/08/2023, 4:07 PM

## 2023-12-17 ENCOUNTER — Other Ambulatory Visit: Payer: Self-pay | Admitting: Physician Assistant

## 2023-12-17 MED ORDER — LANTUS SOLOSTAR 100 UNIT/ML ~~LOC~~ SOPN
60.0000 [IU] | PEN_INJECTOR | Freq: Every day | SUBCUTANEOUS | 11 refills | Status: DC
  Filled 2023-12-17: qty 15, 25d supply, fill #0

## 2023-12-18 ENCOUNTER — Other Ambulatory Visit (HOSPITAL_COMMUNITY): Payer: Self-pay

## 2023-12-22 ENCOUNTER — Other Ambulatory Visit: Payer: Self-pay

## 2023-12-25 ENCOUNTER — Other Ambulatory Visit (HOSPITAL_COMMUNITY): Payer: Self-pay

## 2023-12-25 ENCOUNTER — Ambulatory Visit: Admitting: Physician Assistant

## 2023-12-25 ENCOUNTER — Encounter: Payer: Self-pay | Admitting: Physician Assistant

## 2023-12-25 VITALS — BP 110/68 | HR 91 | Temp 97.6°F | Ht 64.25 in | Wt 182.8 lb

## 2023-12-25 DIAGNOSIS — E1165 Type 2 diabetes mellitus with hyperglycemia: Secondary | ICD-10-CM

## 2023-12-25 DIAGNOSIS — G2581 Restless legs syndrome: Secondary | ICD-10-CM

## 2023-12-25 DIAGNOSIS — M8580 Other specified disorders of bone density and structure, unspecified site: Secondary | ICD-10-CM

## 2023-12-25 DIAGNOSIS — M25571 Pain in right ankle and joints of right foot: Secondary | ICD-10-CM | POA: Diagnosis not present

## 2023-12-25 DIAGNOSIS — R1310 Dysphagia, unspecified: Secondary | ICD-10-CM | POA: Diagnosis not present

## 2023-12-25 DIAGNOSIS — M542 Cervicalgia: Secondary | ICD-10-CM | POA: Insufficient documentation

## 2023-12-25 DIAGNOSIS — F322 Major depressive disorder, single episode, severe without psychotic features: Secondary | ICD-10-CM | POA: Diagnosis not present

## 2023-12-25 DIAGNOSIS — Z794 Long term (current) use of insulin: Secondary | ICD-10-CM

## 2023-12-25 DIAGNOSIS — M545 Low back pain, unspecified: Secondary | ICD-10-CM | POA: Insufficient documentation

## 2023-12-25 LAB — POCT GLYCOSYLATED HEMOGLOBIN (HGB A1C): Hemoglobin A1C: 9 % — AB (ref 4.0–5.6)

## 2023-12-25 MED ORDER — ESCITALOPRAM OXALATE 20 MG PO TABS
20.0000 mg | ORAL_TABLET | Freq: Every day | ORAL | 1 refills | Status: AC
  Filled 2023-12-25 – 2024-01-19 (×2): qty 90, 90d supply, fill #0

## 2023-12-25 MED ORDER — CELECOXIB 200 MG PO CAPS
200.0000 mg | ORAL_CAPSULE | Freq: Two times a day (BID) | ORAL | 1 refills | Status: AC
  Filled 2023-12-25 – 2024-02-14 (×4): qty 180, 90d supply, fill #0

## 2023-12-25 MED ORDER — ROPINIROLE HCL 1 MG PO TABS
1.0000 mg | ORAL_TABLET | Freq: Every day | ORAL | 1 refills | Status: AC
  Filled 2023-12-25 – 2024-02-14 (×2): qty 90, 90d supply, fill #0

## 2023-12-25 MED ORDER — LORATADINE 10 MG PO TABS
10.0000 mg | ORAL_TABLET | Freq: Every evening | ORAL | 3 refills | Status: AC
  Filled 2023-12-25: qty 90, 90d supply, fill #0

## 2023-12-25 NOTE — Patient Instructions (Addendum)
-   Merry Christmas! Always good to see you!   Medications refilled  Referral to GI for ENDOSCOPY due to trouble swallowing  Referral to PT as requested for dry needling  Bone density test ordered  Keep working with Dr. Mercie. A1c was 9.0% today.  Talk with Dr. Joane about injections in back and possibly next steps for right ankle.

## 2023-12-25 NOTE — Progress Notes (Signed)
 Patient ID: Felicia [name], female    DOB: Sep 06, 1950, 73 y.o.   MRN: [id number]   Assessment & Plan:  Dysphagia, unspecified type -     Ambulatory referral to Gastroenterology  Moderately severe major depression (HCC)  Type 2 diabetes mellitus with hyperglycemia, with long-term current use of insulin  (HCC) -     POCT glycosylated hemoglobin (Hb A1C)  Lumbar pain -     Ambulatory referral to Physical Therapy  Cervicalgia -     Ambulatory referral to Physical Therapy  Osteopenia, unspecified location -     DG Bone Density; Future  Restless leg syndrome -     rOPINIRole  HCl; Take 1 tablet (1 mg total) by mouth at bedtime.  Dispense: 90 tablet; Refill: 1  Right ankle pain, unspecified chronicity  Other orders -     Loratadine ; Take 1 tablet (10 mg total) by mouth every evening.  Dispense: 90 tablet; Refill: 3 -     Escitalopram  Oxalate; Take 1 tablet (20 mg total) by mouth daily.  Dispense: 90 tablet; Refill: 1 -     Celecoxib ; Take 1 capsule (200 mg total) by mouth 2 (two) times daily.  Dispense: 180 capsule; Refill: 1    Assessment & Plan Dysphagia Intermittent dysphagia with difficulty swallowing, particularly on the left side, causing pain and occasional inability to eat. Previous ENT evaluation showed normal vocal cords. Differential includes esophageal issues. - Referred to gastroenterology for evaluation and possible endoscopy.  Depression - Refilled Lexapro  20 mg - Encouraged social engagement   Type 2 diabetes mellitus with hyperglycemia Type 2 diabetes with suboptimal glycemic control. Current A1c is 9.0, slightly improved but still elevated. She is on Ozempic  1 mg and 48 units of insulin  at night. Difficulty managing blood glucose levels, especially postprandial. She is not comfortable with the idea of insulin  injections based on carbohydrate intake. Discussed potential use of an insulin  pump to automate insulin  delivery. - Continue current diabetes management  with Ozempic  and insulin . - Follow up with endocrinologist in a couple of weeks for further management.  Chronic neck and back pain Chronic neck and back pain, with recent exacerbation on the left side affecting the hip and waist. Previous MRI showed C6 issues. She is considering dry needling therapy but is concerned about cost and scheduling. She is also experiencing headaches possibly related to neck issues. - Referred to physical therapy for dry needling at [place]. - Will consider referral to Dr. Joane for further evaluation of back pain and possible injection.  Restless legs syndrome Increased symptoms at night. Current treatment includes ropinirole , which was recently increased to 1 mg by Dr. Joane. - Continue ropinirole  at increased dose of 1 mg.  Osteopenia Osteopenia. She is due for a bone density scan but has not received it due to changes in facility availability. - Ordered bone density scan.  General Health Maintenance She is due for flu and pneumonia vaccinations. She declined flu shot today due to concerns about sore arm. - Encouraged flu and pneumonia vaccinations at a later date.      Return in about 3 months (around 03/24/2024) for recheck/follow-up.    Subjective:    Chief Complaint  Patient presents with   Follow-up    Here for a 3 month follow-up. Also needs medication refills. Needing A1c finger stick today. Meets with diabetic Dr again on 12/31.    Diabetes   Medication Refill   Depression    HPI Discussed the use of AI scribe software  for clinical note transcription with the patient, who gave verbal consent to proceed.  History of Present Illness Felicia [name] is a 73 year old female who presents for a regular three-month follow-up.  She has ongoing issues with her right ankle, experiencing spasms and pain at the site of a previous incision. She suspects a screw might be protruding, as she can feel it, but no imaging has been performed. The pain  is localized to the incision line.  Her diabetes management includes Ozempic  1 mg and 48 units of insulin  at night. Her A1c is 9, which has decreased slightly but remains elevated. She struggles with carbohydrate counting, leading to blood sugar spikes after morning meals.  She experiences chronic pain in her back, waist, and hip, particularly on the side of her hip replacement.  She has throat issues, including dryness, pain, and difficulty swallowing, especially on the left side. 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 An ENT evaluation with laryngoscopy showed normal vocal cords, but she has not had an endoscopy to assess her esophagus.  She has a history of postmenopausal bleeding, which was evaluated by a gynecologist and attributed to thinning tissue. A urine test was normal. Her cancer history necessitates monitoring for further bleeding.  Needing some refills today.     Past Medical History:  Diagnosis Date   Adenomatous colon polyp 2015   Anemia    Anxiety    Breast cancer (HCC) 09/2019   left breast IMC   CAD (coronary artery disease)    a. 3/2007s/p DES to the LAD ([place] ); b. 01/2017 MV: EF 80%, small, mild apical ant defect w/o ischemia (felt to be breast atten). Low risk.   Depression    DM (diabetes mellitus) (HCC)    Emphysema lung (HCC)    Essential hypertension    GERD (gastroesophageal reflux disease)    Headache    secondary to neck surgery per patient   High triglycerides    HTN (hypertension)    Hyperlipidemia    Hypothyroid    OA (osteoarthritis)    right knee,hands   Overflow incontinence    Pneumonia 2003   PONV (postoperative nausea and vomiting)    s/p gallbladder    RLS (restless legs syndrome)    Urinary urgency    Uterine cancer (HCC) dx'd 2014    Past Surgical History:  Procedure Laterality Date   BREAST LUMPECTOMY WITH RADIOACTIVE SEED AND SENTINEL LYMPH NODE BIOPSY Left 10/07/2019   Procedure:  LEFT BREAST LUMPECTOMY WITH RADIOACTIVE SEED AND SENTINEL LYMPH NODE BIOPSY;  Surgeon: Ebbie Cough, MD;  Location: [facility];  Service: General;  Laterality: Left;   CHOLECYSTECTOMY     COLONOSCOPY     CORONARY STENT PLACEMENT     CORONARY/GRAFT ACUTE MI REVASCULARIZATION     FINGER ARTHRODESIS Right 06/11/2019   Procedure: ARTHRODESIS INDEX FINGER DISTAL PHALANGEAL JOINT;  Surgeon: Murrell Drivers, MD;  Location: [facility];  Service: Orthopedics;  Laterality: Right;   LEFT HEART CATH AND CORONARY ANGIOGRAPHY N/A 02/16/2018   Procedure: LEFT HEART CATH AND CORONARY ANGIOGRAPHY;  Surgeon: Court Dorn PARAS, MD;  Location: [facility];  Service: Cardiovascular;  Laterality: N/A;   LEFT HEART CATH AND CORONARY ANGIOGRAPHY N/A 05/01/2020   Procedure: LEFT HEART CATH AND CORONARY ANGIOGRAPHY;  Surgeon: Court Dorn PARAS, MD;  Location: [facility];  Service: Cardiovascular;  Laterality: N/A;   ORIF ANKLE FRACTURE Right  08/01/2023   Procedure: OPEN REDUCTION INTERNAL FIXATION (ORIF) ANKLE FRACTURE, RIGHT;  Surgeon: Harden Jerona GAILS, MD;  Location: [facility];  Service: Orthopedics;  Laterality: Right;  RIGHT ORIF DISTAL FIBULA FRACTURE   PORTACATH PLACEMENT Right 11/23/2019   Procedure: INSERTION PORT-A-CATH WITH ULTRASOUND GUIDANCE;  Surgeon: Ebbie Cough, MD;  Location: [facility];  Service: General;  Laterality: Right;   POSTERIOR FUSION CERVICAL SPINE     C3-C7   REPLACEMENT TOTAL KNEE Right    SPINE SURGERY     TOTAL ABDOMINAL HYSTERECTOMY     FOR UTERUS CANCER   TOTAL HIP ARTHROPLASTY Left 2015    Family History  Problem Relation Age of Onset   AAA (abdominal aortic aneurysm) Mother    Thyroid  disease Mother    Colon cancer Mother    Diabetes Father    Alzheimer's disease Father    Arthritis Sister    Heart disease Sister    Throat cancer Sister    Kidney failure Sister    Diabetes Sister    Diabetes Brother     Diabetes Brother    Breast cancer Cousin     Social History[1]   Allergies[2]  Review of Systems NEGATIVE UNLESS OTHERWISE INDICATED IN HPI      Objective:     BP 110/68 (BP Location: Right Arm, Patient Position: Sitting, Cuff Size: Normal)   Pulse 91   Temp 97.6 F (36.4 C) (Temporal)   Ht 5' 4.25 (1.632 m)   Wt 182 lb 12.8 oz (82.9 kg)   SpO2 98%   BMI 31.13 kg/m   Wt Readings from Last 3 Encounters:  12/25/23 182 lb 12.8 oz (82.9 kg)  12/08/23 182 lb 8 oz (82.8 kg)  10/29/23 182 lb (82.6 kg)    BP Readings from Last 3 Encounters:  12/25/23 110/68  10/29/23 108/65  10/21/23 96/62     Physical Exam Vitals and nursing note reviewed.  Constitutional:      General: She is not in acute distress.    Appearance: Normal appearance. She is not ill-appearing.  HENT:     Head: Normocephalic.     Right Ear: External ear normal.     Left Ear: External ear normal.     Nose: No congestion.     Mouth/Throat:     Mouth: Mucous membranes are moist.     Pharynx: No oropharyngeal exudate or posterior oropharyngeal erythema.  Eyes:     Extraocular Movements: Extraocular movements intact.     Conjunctiva/sclera: Conjunctivae normal.     Pupils: Pupils are equal, round, and reactive to light.  Cardiovascular:     Rate and Rhythm: Normal rate and regular rhythm.     Pulses: Normal pulses. No decreased pulses.     Heart sounds: Normal heart sounds. No murmur heard. Pulmonary:     Effort: Pulmonary effort is normal. No respiratory distress.     Breath sounds: Normal breath sounds. No wheezing.  Musculoskeletal:     Cervical back: Normal range of motion.     Right lower leg: No edema.     Left lower leg: No edema.     Comments: Atrophy muscles noted bilateral lower legs  Palpable hardware R lateral ankle   Skin:    General: Skin is warm.  Neurological:     General: No focal deficit present.     Mental Status: She is alert and oriented to person, place, and time.      Cranial Nerves: No cranial nerve deficit.  Sensory: No sensory deficit.     Motor: No weakness.     Gait: Gait normal.  Psychiatric:        Mood and Affect: Mood normal.        Behavior: Behavior normal.         Time Spent: 52 minutes of total time was spent on the date of the encounter performing the following actions: chart review prior to seeing the patient, obtaining history, performing a medically necessary exam, counseling on the treatment plan, placing orders, and documenting in our EHR.    Blas Riches M Edvardo Honse, PA-C     [1]  Social History Tobacco Use   Smoking status: Never   Smokeless tobacco: Never  Vaping Use   Vaping status: Never Used  Substance Use Topics   Alcohol use: Not Currently    Comment: OCCASIONALLY   Drug use: No  [2]  Allergies Allergen Reactions   Chloraprep One Step [Chlorhexidine  Gluconate] Rash   Estrogens Other (See Comments)    PATIENT HAS A HISTORY OF CANCER AND HAS BEEN TOLD TO NEVER TAKE ANYTHING CONTAINING ESTROGEN, AS IT MIGHT CAUSE A RECURRENCE  Other Reaction(s): bad for cancer    Metformin  And Related Diarrhea   Other Dermatitis   Shellfish Protein-Containing Drug Products Nausea And Vomiting   Shrimp [Shellfish Allergy] Nausea And Vomiting   Doxycycline  Itching and Rash

## 2023-12-26 ENCOUNTER — Other Ambulatory Visit (HOSPITAL_COMMUNITY): Payer: Self-pay

## 2023-12-28 ENCOUNTER — Other Ambulatory Visit (HOSPITAL_COMMUNITY): Payer: Self-pay

## 2023-12-29 ENCOUNTER — Ambulatory Visit (INDEPENDENT_AMBULATORY_CARE_PROVIDER_SITE_OTHER): Admitting: Cardiology

## 2023-12-29 ENCOUNTER — Encounter (HOSPITAL_BASED_OUTPATIENT_CLINIC_OR_DEPARTMENT_OTHER): Payer: Self-pay | Admitting: Cardiology

## 2023-12-29 VITALS — BP 110/66 | HR 78 | Ht 64.0 in | Wt 184.8 lb

## 2023-12-29 DIAGNOSIS — Z7189 Other specified counseling: Secondary | ICD-10-CM | POA: Diagnosis not present

## 2023-12-29 DIAGNOSIS — Z794 Long term (current) use of insulin: Secondary | ICD-10-CM

## 2023-12-29 DIAGNOSIS — Z9221 Personal history of antineoplastic chemotherapy: Secondary | ICD-10-CM | POA: Diagnosis not present

## 2023-12-29 DIAGNOSIS — E782 Mixed hyperlipidemia: Secondary | ICD-10-CM | POA: Diagnosis not present

## 2023-12-29 DIAGNOSIS — I2510 Atherosclerotic heart disease of native coronary artery without angina pectoris: Secondary | ICD-10-CM

## 2023-12-29 DIAGNOSIS — Z955 Presence of coronary angioplasty implant and graft: Secondary | ICD-10-CM

## 2023-12-29 DIAGNOSIS — E119 Type 2 diabetes mellitus without complications: Secondary | ICD-10-CM | POA: Diagnosis not present

## 2023-12-29 DIAGNOSIS — E8881 Metabolic syndrome: Secondary | ICD-10-CM | POA: Diagnosis not present

## 2023-12-29 NOTE — Progress Notes (Signed)
 Cardiology Office Note:  .   Date:  12/29/2023  ID:  Felicia [name], DOB [birth year], MRN [id number] PCP: Allwardt, Mardy HERO, PA-C  [facility] Providers Cardiologist:  Shelda Bruckner, MD {  History of Present Illness: .   Felicia [name] is a 73 y.o. female with PMH CAD s/p DES 2017, hypertension, mixed hyperlipidemia, type II diabetes. She was previously followed by Dr. Alveta and established care with me on 12/29/23.  Pertinent CV history: CAD with resolute integrity stent 2017 in mid LAD, 2.25 x 18 mm. Was living in [place]  at the time. Was not having angina, but was unable to be as active as usual. Had stress echo, abnormal, went to cath and was stented. Had cath in 2020 for chest pain, 2022 after abnormal stress test. Caths both showed patent mLAD stent and no other CAD. Most recent echo 2022 with EF 60-65%, normal diastolic function, no significant valve disease.  Today: Overall doing well. Notes family history of heart disease. Sister has afib, pacemaker, valve disease. Brother also has afib. Discussed this today.  She has had blood pressure that typically runs low. She is always asymptomatic. Has been on bisoprolol  for many years, on review of notes appears   Hard to determine if she is having any cardiac chest pain. Has focal pain at sites of prior port. Sometimes will have mild pressure in her chest, nonexertional, mild. Sometimes after eating or lying down. Her prior symptoms before her stent were inability to do her usual activities, not chest pain. Notes that she has had some pain with eating and drinking, pending GI evaluation.  Does note some fatigue, takes naps. Had a sleep study in the past. Discussed bisoprolol , see below.   ROS: Denies shortness of breath at rest or with normal exertion. No PND, orthopnea, LE edema or unexpected weight gain. No syncope or palpitations. ROS otherwise negative except as noted.   Studies Reviewed: SABRA    EKG:  EKG  Interpretation Date/Time:  Monday December 29 2023 16:17:31 EST Ventricular Rate:  78 PR Interval:  190 QRS Duration:  90 QT Interval:  380 QTC Calculation: 433 R Axis:   -43  Text Interpretation: Normal sinus rhythm Left axis deviation Moderate voltage criteria for LVH, may be normal variant When compared with ECG of 06-Aug-2022 11:31, No significant change was found Confirmed by Bruckner Shelda [phone]) on 12/29/2023 4:26:12 PM    Physical Exam:   VS:  BP 110/66 (BP Location: Right Arm, Patient Position: Sitting, Cuff Size: Normal)   Pulse 78   Ht 5' 4 (1.626 m)   Wt 184 lb 12.8 oz (83.8 kg)   SpO2 94%   BMI 31.72 kg/m    Wt Readings from Last 3 Encounters:  12/29/23 184 lb 12.8 oz (83.8 kg)  12/25/23 182 lb 12.8 oz (82.9 kg)  12/08/23 182 lb 8 oz (82.8 kg)    GEN: Well nourished, well developed in no acute distress HEENT: Normal, moist mucous membranes NECK: No JVD CARDIAC: regular rhythm, normal S1 and S2, no rubs or gallops. No murmur. VASCULAR: Radial and DP pulses 2+ bilaterally. No carotid bruits RESPIRATORY:  Clear to auscultation without rales, wheezing or rhonchi  ABDOMEN: Soft, non-tender, non-distended MUSCULOSKELETAL:  Ambulates independently SKIN: Warm and dry, no edema NEUROLOGIC:  Alert and oriented x 3. No focal neuro deficits noted. PSYCHIATRIC:  Normal affect    ASSESSMENT AND PLAN: .    CAD s/p PCI 2017, mLAD Mixed hyperlipidemia Type II diabetes, on insulin   Metabolic syndrome -most recent cath 2022 with patent stent and no other CAD -saw Dr. Mona 05/2023 for lipid management. Lpa normal at 24.7, NMR lipids (was off statin) with LDL 199, TG 325. Started on rosuvastatin  40 mg daily after those numbers. Has not had recheck. Tolerating rosuvastatin  well. Due for recheck lipids and follow up with Dr. Mona -has had TG >1000 in the past -with type II diabetes, would aim for LDL <55.  -on aspirin  -on bisoprolol , presumably as antianginal though she  notes she was on lisinopril as well at one point, so may have been for hypertension. Denies any history of palpitations/arrhythmia. With intermittent hypotension, we will try to wean off of this and see how she feels.  -from CV standpoint, on SGLT2i and GLP1RA for heart protection -Has hepatic steatosis, obesity (BMI 31), diabetes, dyslipidemia as criteria for metabolic syndrome  History of breast cancer, with history of trastuzumab , carboplatin , gemcitabine  chemotherapy and then anastrozole  -diagnosed 2021, has had echoes since without cardiomyopathy  CV risk counseling and prevention -recommend heart healthy/Mediterranean diet, with whole grains, fruits, vegetable, fish, lean meats, nuts, and olive oil. Limit salt. -recommend moderate walking, 3-5 times/week for 30-50 minutes each session. Aim for at least 150 minutes/week. Goal should be pace of 3 miles/hours, or walking 1.5 miles in 30 minutes -recommend avoidance of tobacco products. Avoid excess alcohol.  Dispo: 1 year or sooner as needed  Signed, Shelda Bruckner, MD   Shelda Bruckner, MD, PhD, [facility]  [facility]  [place]  Heart & Vascular at [facility] at [facility] [address] [place], [place] [address] [phone]

## 2023-12-29 NOTE — Patient Instructions (Addendum)
 Heart medications: Bisoprolol : usually for angina (chest pain), palpitations, or high blood pressure. We will try to wean this off and see how you feel. Try taking 1/2 pill for about a week, then stop. If you have any issues or feel poorly off of it, let me know and  Aspirin : to protect stent, will be on forever. Watch for blood in the stool or blood in the urine.  Rosuvastatin : for cholesterol. Due for recheck cholesterol for Dr. Mona __________________________________   Follow up in one year.

## 2023-12-30 ENCOUNTER — Other Ambulatory Visit: Payer: Self-pay | Admitting: Pharmacist

## 2023-12-30 NOTE — Progress Notes (Signed)
 12/30/2023 Name: Felicia [name] MRN: [id number] DOB: [birth year]  No chief complaint on file.   Felicia [name] is a 73 y.o. year old female who presented for a telephone visit.   They were referred to the pharmacist by their PCP for assistance in managing diabetes, complex medication management, and frequent falls.   Subjective:  Care Team: Primary Care Provider: Allwardt, Mardy HERO, PA-C ; Next Scheduled Visit: 12/25/2023 Endocrinologist Dr Mercie; Next Scheduled Visit: 01/14/2024 Neurologist: Skeet; Next Scheduled Visit: - 09/21/2024  Cardiology: Dr Lonni; next scheduled visit: recall set for 11/2024 Oncology: Dr Odean: Next Scheduled Visit: 09/21/2024  Medication Access/Adherence  Current Pharmacy:  [name] [address] [place] [address] Phone: [phone] Fax: [phone]  [name] [address] [place] [address] Phone: [phone] Fax: [phone]  [facility] [address] [place] [address] Phone: [phone] Fax: [phone]   Patient reports affordability concerns with their medications: No  Patient reports access/transportation concerns to their pharmacy: No  Patient reports adherence concerns with their medications:  No      Diabetes:  Current medications:  Jardiance  25mg  daily, glipizide  10mg  - take 2 tablets every morning, Lantus  50 units once a day per Dr Eugenio last note but patient decreased to 48 units. Ozempic  1 mg weekly (restarted Ozempic  around 08/03/2023)  Medications tried in the past: Trulicity  - stopped because she had trouble getting it; Mounjaro  - chart states she stopped due to diarrhea and night sweats but patient did not remember taking Mounjaro ; metformin  - stopped because suspected was worsening diarrhea (fecal incontinence)   Using Dex Com G7 with DexCom reader. Patient provided the  following from her reader reports:  Last 90 days:  GMI = 8.0% Average blood glucose = 196 Time in range = 44% / time > 250 = 17% / time 181 to 250 = 37% / time 55 to 70 = 1% / time < 55 = 1%  Last 14 days:  GMI = 8.4% Average blood glucose = 212 Time in range = 30% / time > 250 = 22% / time 181 to 250 = 47% / time 55 to 70 = 1% / time < 55 = 0%  Patient reports one recent hypoglycemic event that woke her during the night. She drank sweet tea and ate crackers and blood glucose returned to normal.  Patient denies hyperglycemic symptoms including no polyuria, polydipsia, polyphagia, nocturia, neuropathy, blurred vision.  Diet:  Breakfast - toast with margarine (no jelly / preserves) or cottage cheese Lunch - cold cuts or veggies Dinner - meals on wheels meal with protein, non startchy vegetable and starchy vegetable. She is eating more non starchy vegetables - Brussels sprouts, broccoli,  Snacks - cheese, cookies Drinks - diet soda or water .   Current medication access support: none  Exercise: none currently - having some hip pain and will see ortho in February 2026.    Macrovascular and Microvascular Risk Reduction:  Statin? yes (rosuvastatin  40mg ); ACEi/ARB? No due to low blood pressure  Last urinary albumin/creatinine ratio:  Lab Results  Component Value Date   MICRALBCREAT 20.6 03/18/2023   Last eye exam:  Lab Results  Component Value Date   HMDIABEYEEXA No Retinopathy 04/24/2022   Last foot exam: 10/23/2022 Tobacco Use:  Tobacco Use: Low Risk (12/29/2023)   Patient History    Smoking Tobacco Use: Never    Smokeless Tobacco Use: Never  Passive Exposure: Past     Hypertension:  Current medications: bisoprolol  5mg  - taking 0.5 tablet daily for 1 week starting today and then stop per cardiology visit 12/29/2023  Patient reports hypotensive s/sx including occasional dizziness, lightheadedness.  Patient denies hypertensive symptoms including no headache, chest  pain, shortness of breath  Hyperlipidemia:  Last LDL was 199 per NMR checked 06/11/2023 She restarted rosuvastatin  40mg  daily 07/23/2023.  Patient report she is tolerating rosuvastatin  without any side effects.   Objective:  BP Readings from Last 3 Encounters:  12/29/23 110/66  12/25/23 110/68  10/29/23 108/65     Lab Results  Component Value Date   HGBA1C 9.0 (A) 12/25/2023    Lab Results  Component Value Date   CREATININE 1.02 (H) 08/01/2023   BUN 21 08/01/2023   NA 135 08/01/2023   K 4.3 08/01/2023   CL 101 08/01/2023   CO2 21 (L) 08/01/2023    Lab Results  Component Value Date   CHOL 281 (H) 11/14/2022   HDL 46 11/14/2022   LDLCALC 161 (H) 11/14/2022   TRIG 389 (H) 11/14/2022   CHOLHDL 6.1 (H) 11/14/2022    Medications Reviewed Today     Reviewed by Carla Milling, RPH-CPP (Pharmacist) on 12/30/23 at 1326  Med List Status: <None>   Medication Order Taking? Sig Documenting Provider Last Dose Status Informant  acetaminophen  (TYLENOL ) 500 MG tablet [id number] Yes Take 500 mg by mouth every 6 (six) hours as needed for mild pain (pain score 1-3) or moderate pain (pain score 4-6). [provider]  Active Self  albuterol  (VENTOLIN  HFA) 108 (90 Base) MCG/ACT inhaler [id number] Yes Inhale 2 puffs into the lungs every 6 (six) hours as needed for wheezing or shortness of breath. Kassie Acquanetta Bradley, MD  Active Self  anastrozole  (ARIMIDEX ) 1 MG tablet [id number] Yes Take 1 tablet (1 mg total) by mouth daily. Gudena, Vinay, MD  Active   ascorbic acid (VITAMIN C) 500 MG tablet [id number] Yes Take 500 mg by mouth daily. [provider]  Active Self  aspirin  EC (ASPIRIN  LOW DOSE) 81 MG tablet [id number] Yes Take 1 tablet (81 mg total) by mouth in the morning. Allwardt, Alyssa M, PA-C  Active   azelastine  (ASTELIN ) 0.1 % nasal spray [id number] Yes Place 2 sprays into both nostrils 2 (two) times daily. Use in each nostril as directed Allwardt, Mardy HERO, PA-C  Active Self   blood glucose meter kit and supplies [id number] Yes Dispense based on patient and insurance preference. Use up to four times daily as directed. (FOR ICD-10 E10.9, E11.9). Allwardt, Mardy HERO, PA-C  Active Self  buPROPion  (WELLBUTRIN  XL) 150 MG 24 hr tablet [id number] Yes Take 1 tablet (150 mg total) by mouth every morning for depression. Allwardt, Alyssa M, PA-C  Active   celecoxib  (CELEBREX ) 200 MG capsule [id number] Yes Take 1 capsule (200 mg total) by mouth 2 (two) times daily. Allwardt, Alyssa M, PA-C  Active   CINNAMON PO [id number] Yes Take 600 mg by mouth daily with supper. [provider]  Active Self  clotrimazole -betamethasone  (LOTRISONE) cream [id number] Yes Apply 1 Application topically daily as needed (Rash). [provider]  Active Self  Continuous Glucose Receiver (DEXCOM G7 RECEIVER) DEVI [id number] Yes Use to monitor glucose continuously. Thapa, Sudan, MD  Active   Continuous Glucose Sensor (DEXCOM G7 SENSOR) MISC [id number] Yes Change sensor every 10 days. Thapa, [place], MD  Active   diclofenac  sodium (VOLTAREN ) 1 % GEL [id number] Yes Apply 2-4 g topically 4 (four)  times daily as needed (as directed for pain). [provider]  Active Self  empagliflozin  (JARDIANCE ) 25 MG TABS tablet [id number] Yes Take 1 tablet (25 mg total) by mouth daily. Allwardt, Alyssa M, PA-C  Active   escitalopram  (LEXAPRO ) 20 MG tablet [id number] Yes Take 1 tablet (20 mg total) by mouth daily. Allwardt, Alyssa M, PA-C  Active   fenofibrate  (TRICOR ) 145 MG tablet [id number] Yes Take 1 tablet (145 mg total) by mouth at bedtime. Allwardt, Mardy HERO, PA-C  Active   Ferrous Sulfate (IRON PO) [id number] Yes Take 1 tablet by mouth daily. alternates days:1 tablet one day and 2 tablets the next day [provider]  Active Self  FIBER PO [id number] Yes Take 1 tablet by mouth daily. [provider]  Active Self  gabapentin  (NEURONTIN ) 600 MG tablet [id number] Yes Take 1 tablet (600 mg total)  by mouth 3 (three) times daily. Skeet Juliene SAUNDERS, DO  Active   glipiZIDE  (GLUCOTROL  XL) 10 MG 24 hr tablet [id number] Yes Take 2 tablets (20 mg total) by mouth every morning. Allwardt, Mardy HERO, PA-C  Active   glucose blood test strip [id number] Yes Use to check blood sugar up to four times daily as directed Allwardt, Mardy HERO, PA-C  Active Self  Incontinence Supply Disposable (DEPEND ADJUSTABLE UNDERWEAR) MISC [id number] Yes 1 Application by Does not apply route daily. Allwardt, Mardy HERO, PA-C  Active Self  insulin  glargine (LANTUS  SOLOSTAR) 100 UNIT/ML Solostar Pen [id number] Yes Start with injecting 4 units under the skin in the evenings. Increase by 2 units every 2 days until monring fasting glucose is to goal (140). Max units 60 per day. Allwardt, Mardy HERO, PA-C  Active   Insulin  Pen Needle (INSUPEN PEN NEEDLES) 32G X 4 MM MISC [id number] Yes Use to inject insulin  as directed Allwardt, Alyssa M, PA-C  Active Self  ketoconazole  (NIZORAL ) 2 % cream [id number] Yes Apply 1 Application topically daily. Allwardt, Mardy HERO, PA-C  Active Self  Lancets MISC [id number] Yes Use to check blood sugar up to four times daily as directed Allwardt, Mardy HERO, PA-C  Active Self  levothyroxine  (SYNTHROID ) 125 MCG tablet [id number] Yes Take 1 tablet (125 mcg total) by mouth daily before breakfast. Allwardt, Alyssa M, PA-C  Active   loratadine  (ALLERGY RELIEF) 10 MG tablet [id number] Yes Take 1 tablet (10 mg total) by mouth every evening. Allwardt, Mardy HERO, PA-C  Active   magnesium  gluconate (MAGONATE) 500 (27 Mg) MG TABS tablet [id number] Yes Take 500 mg by mouth at bedtime. [provider]  Active Self  Multiple Vitamins-Calcium  (ONE-A-DAY WOMENS PO) [id number] Yes Take 1 tablet by mouth daily. [provider]  Active Self  mupirocin  cream (BACTROBAN ) 2 % [id number] Yes Apply 1 Application topically 3 (three) times daily. Zamora, Erin R, NP  Active   nitroGLYCERIN  (NITROSTAT ) 0.4 MG SL tablet [id number] Yes  Dissolve 1 tablet under the tongue every 5 minutes as needed for chest pain. Max of 3 doses, then 911. Nahser, Aleene PARAS, MD  Active Self  nortriptyline  (PAMELOR ) 10 MG capsule [id number] Yes Take 1 capsule (10 mg total) by mouth at bedtime. Skeet Juliene SAUNDERS, DO  Active   omeprazole  (PRILOSEC) 40 MG capsule [id number] Yes Take 1 capsule (40 mg total) by mouth every evening. Allwardt, Mardy HERO, PA-C  Active   polyethylene glycol powder (MIRALAX ) 17 GM/SCOOP powder [id number] Yes Take 17 g by mouth daily. Craig Alan SAUNDERS, [place]  Active Self  Potassium 99 MG TABS [id number] Yes Take  99 mg by mouth at bedtime. [provider]  Active Self  rOPINIRole  (REQUIP ) 1 MG tablet [id number] Yes Take 1 tablet (1 mg total) by mouth at bedtime. Allwardt, Alyssa M, PA-C  Active   rosuvastatin  (CRESTOR ) 40 MG tablet [id number] Yes Take 1 tablet (40 mg total) by mouth daily. Mona Vinie BROCKS, MD  Active Self  Semaglutide , 1 MG/DOSE, 4 MG/3ML SOPN [id number] Yes Inject 1 mg as directed once a week. Thapa, [place], MD  Active   Tiotropium Bromide-Olodaterol (STIOLTO RESPIMAT ) 2.5-2.5 MCG/ACT AERS [id number] Yes Inhale 2 puffs into the lungs daily. Kassie Acquanetta Bradley, MD  Active Self  tiZANidine  (ZANAFLEX ) 2 MG tablet [id number] Yes Take 1 tablet (2 mg total) by mouth 3 (three) times daily. Skeet Juliene SAUNDERS, DO  Active   TURMERIC PO [id number] Yes Take 450 mg by mouth daily with supper. [provider]  Active Self  Med List Note Christie Alyson Sola 07/31/23 9041): [place]              Assessment/Plan:   Diabetes: Currently uncontrolled - but improving with Lantus  titration and restarting Ozempic .  - Reviewed goal A1c, goal fasting, and goal 2 hour post prandial glucose. Recommend to check glucose continuously with DexCom  - Reviewed diet and limiting foods containing sugar. - Encouraged her to move more as able. Consider doing her chair yoga video.  - Recommend to continue Jardiance , glipizide   -  Continue Lantus  from 48 units once a day. - Reviewed signs and symptoms of hypoglycemia and how to treat.  - Continue Ozempic  1mg  weekly. Will see Dr Mercie 01/14/2024 - there is room to increase Ozempic  to 2mg  or retry Mounjaro .    Hypertension:Currently controlled - Reviewed long term cardiovascular and renal outcomes of uncontrolled blood pressure - continue with plan per cardiology to lower dose of bisoprolol  to 0.5 tablet for 1 week, then stop  Hyperlipidemia  -continue rosuvastatin  40mg  daily - due to recheck lipids  Health Maintenance:  - Discussed yearly diabetic eye exam. Patient is working on getting appointment set up.  - Provided phone number for GI referral - Dr San [phone] - Discussed flu and Shingrix vaccines - Patient plans to get soon. Also discussed pneumonia vaccine - patient is up to date.   Follow Up Plan: 4 to 6 weeks  Madelin Ray, PharmD Clinical Pharmacist [facility]  Population Health [phone]

## 2023-12-31 ENCOUNTER — Other Ambulatory Visit: Payer: Self-pay | Admitting: Physician Assistant

## 2023-12-31 ENCOUNTER — Other Ambulatory Visit: Payer: Self-pay

## 2023-12-31 ENCOUNTER — Other Ambulatory Visit (HOSPITAL_COMMUNITY): Payer: Self-pay

## 2023-12-31 MED ORDER — GLIPIZIDE ER 10 MG PO TB24
20.0000 mg | ORAL_TABLET | Freq: Every morning | ORAL | 1 refills | Status: AC
  Filled 2023-12-31: qty 90, 45d supply, fill #0
  Filled 2024-02-14: qty 90, 45d supply, fill #1

## 2024-01-01 ENCOUNTER — Other Ambulatory Visit: Payer: Self-pay

## 2024-01-01 ENCOUNTER — Other Ambulatory Visit (HOSPITAL_COMMUNITY): Payer: Self-pay

## 2024-01-11 ENCOUNTER — Other Ambulatory Visit: Payer: Self-pay

## 2024-01-12 ENCOUNTER — Other Ambulatory Visit: Payer: Self-pay

## 2024-01-12 ENCOUNTER — Other Ambulatory Visit (HOSPITAL_COMMUNITY): Payer: Self-pay

## 2024-01-14 ENCOUNTER — Encounter: Payer: Self-pay | Admitting: Endocrinology

## 2024-01-14 ENCOUNTER — Ambulatory Visit: Admitting: Endocrinology

## 2024-01-14 ENCOUNTER — Ambulatory Visit: Admitting: Family

## 2024-01-14 ENCOUNTER — Other Ambulatory Visit (HOSPITAL_COMMUNITY): Payer: Self-pay

## 2024-01-14 ENCOUNTER — Other Ambulatory Visit (HOSPITAL_BASED_OUTPATIENT_CLINIC_OR_DEPARTMENT_OTHER): Payer: Self-pay

## 2024-01-14 ENCOUNTER — Ambulatory Visit

## 2024-01-14 VITALS — BP 130/70 | HR 97 | Resp 16 | Ht 64.0 in | Wt 180.4 lb

## 2024-01-14 DIAGNOSIS — E1165 Type 2 diabetes mellitus with hyperglycemia: Secondary | ICD-10-CM | POA: Diagnosis not present

## 2024-01-14 DIAGNOSIS — S82891G Other fracture of right lower leg, subsequent encounter for closed fracture with delayed healing: Secondary | ICD-10-CM | POA: Diagnosis not present

## 2024-01-14 DIAGNOSIS — Z794 Long term (current) use of insulin: Secondary | ICD-10-CM | POA: Diagnosis not present

## 2024-01-14 DIAGNOSIS — Z7985 Long-term (current) use of injectable non-insulin antidiabetic drugs: Secondary | ICD-10-CM

## 2024-01-14 DIAGNOSIS — Z7984 Long term (current) use of oral hypoglycemic drugs: Secondary | ICD-10-CM

## 2024-01-14 DIAGNOSIS — E039 Hypothyroidism, unspecified: Secondary | ICD-10-CM

## 2024-01-14 MED ORDER — SEMAGLUTIDE (2 MG/DOSE) 8 MG/3ML ~~LOC~~ SOPN
2.0000 mg | PEN_INJECTOR | SUBCUTANEOUS | 3 refills | Status: AC
  Filled 2024-01-14: qty 9, 90d supply, fill #0

## 2024-01-14 MED ORDER — LANTUS SOLOSTAR 100 UNIT/ML ~~LOC~~ SOPN
40.0000 [IU] | PEN_INJECTOR | Freq: Every day | SUBCUTANEOUS | 3 refills | Status: AC
  Filled 2024-01-14: qty 30, 75d supply, fill #0
  Filled 2024-01-19: qty 30, 75d supply, fill #1

## 2024-01-14 NOTE — Patient Instructions (Addendum)
 Diabetes with  Decrease Lantus  to 40 units daily at bedtime.  Increase Ozempic  to 2 mg weekly.  Continue current dose of glipizide  and Jardiance .

## 2024-01-14 NOTE — Progress Notes (Signed)
 "  Outpatient Endocrinology Note Baptiste Littler, MD   Patient's Name: Felicia [name]    DOB: [birth year]    MRN: [id number]                                                    REASON OF VISIT: Follow-up for type 2 diabetes mellitus  REFERRING PROVIDER: Allwardt, Mardy HERO, PA-C  PCP: Allwardt, Mardy HERO, PA-C  HISTORY OF PRESENT ILLNESS:   Felicia [name] is a 73 y.o. old female with past medical history listed below, is here for follow-up for type 2 diabetes mellitus.   Pertinent Diabetes History: Patient was referred to endocrinology for evaluation and management of type 2 diabetes mellitus uncontrolled.  Initial consult in October 14, 2023.    Patient was diagnosed with type 2 diabetes mellitus around late 1990s.  History of DKA or diabetes related hospitalizations: none  Previous diabetes education: Yes , long time ago.   Family h/o diabetes mellitus: father and brother / sister with type 2 diabetes.    No personal history of pancreatitis and / or family history of medullary thyroid  carcinoma or MEN 2B syndrome.   She has uncontrolled type 2 diabetes mellitus with mostly hemoglobin A1c in the range of 9 to 10% range.  S  Chronic Diabetes Complications : Retinopathy: no. Last ophthalmology exam was done on 04/2022, following with ophthalmology regularly.  Nephropathy: no Peripheral neuropathy: no Coronary artery disease: CAD s/p stents Stroke: no  Relevant comorbidities and cardiovascular risk factors: Obesity: yes Body mass index is 30.97 kg/m.  Hypertension: Yes  Hyperlipidemia : Yes, on statin   Current / Home Diabetic regimen includes:  Lantus  46-48 units at bedtime. Ozempic  1 mg weekly, Sundays.  Glipizide  XL 10mg  2 tabs with breakfast. Jardiance  25mg  daily.  Prior diabetic medications: Trulicity  in the past he stopped due to backorder.  Changed to Mounjaro  stopped due to diarrhea and night sweats.  Metformin  stopped due to diarrhea.  She was started on Ozempic  in  June and has been on 0.5 mg weekly since around August 03, 2023.  Glycemic data:    CONTINUOUS GLUCOSE MONITORING SYSTEM (CGMS) INTERPRETATION:              Dexcom G7 CGM-  Sensor Download (Sensor download was reviewed and summarized below.) Dates: December 18 to January 14, 2024, 14 days  Glucose Management Indicator: 7.8% Sensor usage:90 %    Impression: - Variable blood sugar with postprandial hyperglycemia with blood sugar up to 250 range related to high carb meal especially with lunch and sometime in the afternoon with supper.  Blood sugar in the morning and overnight trending down and acceptable, blood sugar in between the meals acceptable.  No hypoglycemia.  Hypoglycemia: Patient has no hypoglycemic episodes. Patient has hypoglycemia awareness.  Factors modifying glucose control: 1.  Diabetic diet assessment: 3 meals a day.  Usually drinks diet soda and breads.  2.  Staying active or exercising: No formal exercise.  3.  Medication compliance: compliant all of the time.  # Primary hypothyroidism : Taking levothyroxine  125 mcg daily.  TSH normal in September 2025.     Interval history  CGM data as reviewed above.  Postprandial hyperglycemia.  Diabetes regimen as reviewed and noted above.  She has been tolerating Ozempic  well.  Hemoglobin A1c was mildly improved last month to 9%.  GMI on CGM 7.8%.  Slowly improvement on blood sugar control.  Denies numbness and ting of the feet.  No vision problem.  She wants thyroid  to be managed in this clinic as well, currently taking levothyroxine  125 mg daily.  She had normal thyroid  function test in September.  No hypo or hyperthyroid symptoms.   REVIEW OF SYSTEMS As per history of present illness.   PAST MEDICAL HISTORY: Past Medical History:  Diagnosis Date   Adenomatous colon polyp 2015   Anemia    Anxiety    Breast cancer (HCC) 09/2019   left breast IMC   CAD (coronary artery disease)    a. 3/2007s/p DES to the LAD [facility]); b. 01/2017 MV: EF 80%, small, mild apical ant defect w/o ischemia (felt to be breast atten). Low risk.   Depression    DM (diabetes mellitus) (HCC)    Emphysema lung (HCC)    Essential hypertension    GERD (gastroesophageal reflux disease)    Headache    secondary to neck surgery per patient   High triglycerides    HTN (hypertension)    Hyperlipidemia    Hypothyroid    OA (osteoarthritis)    right knee,hands   Overflow incontinence    Pneumonia 2003   PONV (postoperative nausea and vomiting)    s/p gallbladder    RLS (restless legs syndrome)    Urinary urgency    Uterine cancer (HCC) dx'd 2014    PAST SURGICAL HISTORY: Past Surgical History:  Procedure Laterality Date   BREAST LUMPECTOMY WITH RADIOACTIVE SEED AND SENTINEL LYMPH NODE BIOPSY Left 10/07/2019   Procedure: LEFT BREAST LUMPECTOMY WITH RADIOACTIVE SEED AND SENTINEL LYMPH NODE BIOPSY;  Surgeon: Ebbie Cough, MD;  Location: [facility];  Service: General;  Laterality: Left;   CHOLECYSTECTOMY     COLONOSCOPY     CORONARY STENT PLACEMENT     CORONARY/GRAFT ACUTE MI REVASCULARIZATION     FINGER ARTHRODESIS Right 06/11/2019   Procedure: ARTHRODESIS INDEX FINGER DISTAL PHALANGEAL JOINT;  Surgeon: Murrell Drivers, MD;  Location: [facility];  Service: Orthopedics;  Laterality: Right;   LEFT HEART CATH AND CORONARY ANGIOGRAPHY N/A 02/16/2018   Procedure: LEFT HEART CATH AND CORONARY ANGIOGRAPHY;  Surgeon: Court Dorn PARAS, MD;  Location: [facility];  Service: Cardiovascular;  Laterality: N/A;   LEFT HEART CATH AND CORONARY ANGIOGRAPHY N/A 05/01/2020   Procedure: LEFT HEART CATH AND CORONARY ANGIOGRAPHY;  Surgeon: Court Dorn PARAS, MD;  Location: [facility];  Service: Cardiovascular;  Laterality: N/A;   ORIF ANKLE FRACTURE Right 08/01/2023   Procedure: OPEN REDUCTION INTERNAL FIXATION (ORIF) ANKLE FRACTURE, RIGHT;  Surgeon: Harden Jerona GAILS, MD;  Location: [facility];   Service: Orthopedics;  Laterality: Right;  RIGHT ORIF DISTAL FIBULA FRACTURE   PORTACATH PLACEMENT Right 11/23/2019   Procedure: INSERTION PORT-A-CATH WITH ULTRASOUND GUIDANCE;  Surgeon: Ebbie Cough, MD;  Location: [facility];  Service: General;  Laterality: Right;   POSTERIOR FUSION CERVICAL SPINE     C3-C7   REPLACEMENT TOTAL KNEE Right    SPINE SURGERY     TOTAL ABDOMINAL HYSTERECTOMY     FOR UTERUS CANCER   TOTAL HIP ARTHROPLASTY Left 2015    ALLERGIES: Allergies  Allergen Reactions   Chloraprep One Step [Chlorhexidine  Gluconate] Rash   Estrogens Other (See Comments)    PATIENT HAS A HISTORY OF CANCER AND HAS BEEN TOLD TO NEVER TAKE ANYTHING CONTAINING ESTROGEN, AS IT MIGHT CAUSE A RECURRENCE  Other Reaction(s): bad for cancer    Metformin  And Related Diarrhea   Other Dermatitis   Shellfish Protein-Containing Drug Products Nausea And Vomiting   Shrimp [Shellfish Allergy] Nausea And Vomiting   Doxycycline  Itching and Rash    FAMILY HISTORY:  Family History  Problem Relation Age of Onset   AAA (abdominal aortic aneurysm) Mother    Thyroid  disease Mother    Colon cancer Mother    Diabetes Father    Alzheimer's disease Father    Arthritis Sister    Heart disease Sister    Throat cancer Sister    Kidney failure Sister    Diabetes Sister    Diabetes Brother    Diabetes Brother    Breast cancer Cousin     SOCIAL HISTORY: Social History   Socioeconomic History   Marital status: Divorced    Spouse name: Not on file   Number of children: 2   Years of education: Not on file   Highest education level: Not on file  Occupational History   Occupation: retired  Tobacco Use   Smoking status: Never    Passive exposure: Past   Smokeless tobacco: Never  Vaping Use   Vaping status: Never Used  Substance and Sexual Activity   Alcohol use: Not Currently    Comment: OCCASIONALLY   Drug use: No   Sexual activity: Not Currently    Birth  control/protection: Surgical    Comment: hyst  Other Topics Concern   Not on file  Social History Narrative   Originally from [place] , moved here in 2018.    Right handed   Drinks decaf   One floor house   Social Drivers of Health   Tobacco Use: Low Risk (01/14/2024)   Patient History    Smoking Tobacco Use: Never    Smokeless Tobacco Use: Never    Passive Exposure: Past  Financial Resource Strain: Low Risk (07/01/2023)   Overall Financial Resource Strain (CARDIA)    Difficulty of Paying Living Expenses: Not hard at all  Food Insecurity: No Food Insecurity (08/01/2023)   Epic    Worried About Programme Researcher, Broadcasting/film/video in the Last Year: Never true    Ran Out of Food in the Last Year: Never true  Transportation Needs: Unmet Transportation Needs (09/19/2023)   Epic    Lack of Transportation (Medical): Yes    Lack of Transportation (Non-Medical): No  Physical Activity: Inactive (07/01/2023)   Exercise Vital Sign    Days of Exercise per Week: 0 days    Minutes of Exercise per Session: 0 min  Stress: Stress Concern Present (07/01/2023)   [employer] of Occupational Health - Occupational Stress Questionnaire    Feeling of Stress: To some extent  Social Connections: Socially Isolated (08/01/2023)   Social Connection and Isolation Panel    Frequency of Communication with Friends and Family: Twice a week    Frequency of Social Gatherings with Friends and Family: Twice a week    Attends Religious Services: Never    Database Administrator or Organizations: No    Attends Banker Meetings: Never    Marital Status: Divorced  Depression (PHQ2-9): High Risk (09/04/2023)   Depression (PHQ2-9)    PHQ-2 Score: 15  Alcohol Screen: Low Risk (07/01/2023)   Alcohol Screen    Last Alcohol Screening Score (AUDIT): 1  Housing: Low Risk (08/01/2023)   Epic    Unable to Pay for Housing in the Last Year: No    Number  of Times Moved in the Last Year: 0    Homeless in the Last Year: No   Utilities: Not At Risk (08/01/2023)   Epic    Threatened with loss of utilities: No  Health Literacy: Adequate Health Literacy (07/01/2023)   B1300 Health Literacy    Frequency of need for help with medical instructions: Never    MEDICATIONS:  Current Outpatient Medications  Medication Sig Dispense Refill   acetaminophen  (TYLENOL ) 500 MG tablet Take 500 mg by mouth every 6 (six) hours as needed for mild pain (pain score 1-3) or moderate pain (pain score 4-6).     albuterol  (VENTOLIN  HFA) 108 (90 Base) MCG/ACT inhaler Inhale 2 puffs into the lungs every 6 (six) hours as needed for wheezing or shortness of breath. 6.7 g 2   anastrozole  (ARIMIDEX ) 1 MG tablet Take 1 tablet (1 mg total) by mouth daily. 90 tablet 4   ascorbic acid (VITAMIN C) 500 MG tablet Take 500 mg by mouth daily.     aspirin  EC (ASPIRIN  LOW DOSE) 81 MG tablet Take 1 tablet (81 mg total) by mouth in the morning. 90 tablet 3   azelastine  (ASTELIN ) 0.1 % nasal spray Place 2 sprays into both nostrils 2 (two) times daily. Use in each nostril as directed 30 mL 11   blood glucose meter kit and supplies Dispense based on patient and insurance preference. Use up to four times daily as directed. (FOR ICD-10 E10.9, E11.9). 1 each 0   buPROPion  (WELLBUTRIN  XL) 150 MG 24 hr tablet Take 1 tablet (150 mg total) by mouth every morning for depression. 30 tablet 2   celecoxib  (CELEBREX ) 200 MG capsule Take 1 capsule (200 mg total) by mouth 2 (two) times daily. 180 capsule 1   CINNAMON PO Take 600 mg by mouth daily with supper.     clotrimazole -betamethasone  (LOTRISONE) cream Apply 1 Application topically daily as needed (Rash).     Continuous Glucose Receiver (DEXCOM G7 RECEIVER) DEVI Use to monitor glucose continuously. 1 each 0   Continuous Glucose Sensor (DEXCOM G7 SENSOR) MISC Change sensor every 10 days. 9 each 3   diclofenac  sodium (VOLTAREN ) 1 % GEL Apply 2-4 g topically 4 (four) times daily as needed (as directed for pain).      empagliflozin  (JARDIANCE ) 25 MG TABS tablet Take 1 tablet (25 mg total) by mouth daily. 90 tablet 1   escitalopram  (LEXAPRO ) 20 MG tablet Take 1 tablet (20 mg total) by mouth daily. 90 tablet 1   fenofibrate  (TRICOR ) 145 MG tablet Take 1 tablet (145 mg total) by mouth at bedtime. 90 tablet 3   Ferrous Sulfate (IRON PO) Take 1 tablet by mouth daily. alternates days:1 tablet one day and 2 tablets the next day     FIBER PO Take 1 tablet by mouth daily.     gabapentin  (NEURONTIN ) 600 MG tablet Take 1 tablet (600 mg total) by mouth 3 (three) times daily. 90 tablet 5   glipiZIDE  (GLUCOTROL  XL) 10 MG 24 hr tablet Take 2 tablets (20 mg total) by mouth every morning. 90 tablet 1   glucose blood test strip Use to check blood sugar up to four times daily as directed 100 each 3   Incontinence Supply Disposable (DEPEND ADJUSTABLE UNDERWEAR) MISC 1 Application by Does not apply route daily. 16 each 11   Insulin  Pen Needle (INSUPEN PEN NEEDLES) 32G X 4 MM MISC Use to inject insulin  as directed 100 each 3   ketoconazole  (NIZORAL ) 2 % cream Apply  1 Application topically daily. 60 g 0   Lancets MISC Use to check blood sugar up to four times daily as directed 100 each 3   levothyroxine  (SYNTHROID ) 125 MCG tablet Take 1 tablet (125 mcg total) by mouth daily before breakfast. 30 tablet 2   loratadine  (ALLERGY RELIEF) 10 MG tablet Take 1 tablet (10 mg total) by mouth every evening. 90 tablet 3   magnesium  gluconate (MAGONATE) 500 (27 Mg) MG TABS tablet Take 500 mg by mouth at bedtime.     Multiple Vitamins-Calcium  (ONE-A-DAY WOMENS PO) Take 1 tablet by mouth daily.     mupirocin  cream (BACTROBAN ) 2 % Apply 1 Application topically 3 (three) times daily. 15 g 0   nitroGLYCERIN  (NITROSTAT ) 0.4 MG SL tablet Dissolve 1 tablet under the tongue every 5 minutes as needed for chest pain. Max of 3 doses, then 911. 75 tablet 2   nortriptyline  (PAMELOR ) 10 MG capsule Take 1 capsule (10 mg total) by mouth at bedtime. 30 capsule 5    omeprazole  (PRILOSEC) 40 MG capsule Take 1 capsule (40 mg total) by mouth every evening. 90 capsule 1   polyethylene glycol powder (MIRALAX ) 17 GM/SCOOP powder Take 17 g by mouth daily. 255 g 11   Potassium 99 MG TABS Take 99 mg by mouth at bedtime.     rOPINIRole  (REQUIP ) 1 MG tablet Take 1 tablet (1 mg total) by mouth at bedtime. 90 tablet 1   rosuvastatin  (CRESTOR ) 40 MG tablet Take 1 tablet (40 mg total) by mouth daily. 90 tablet 3   Semaglutide , 2 MG/DOSE, 8 MG/3ML SOPN Inject 2 mg as directed once a week. 9 mL 3   Tiotropium Bromide-Olodaterol (STIOLTO RESPIMAT ) 2.5-2.5 MCG/ACT AERS Inhale 2 puffs into the lungs daily. 4 g 11   tiZANidine  (ZANAFLEX ) 2 MG tablet Take 1 tablet (2 mg total) by mouth 3 (three) times daily. 90 tablet 5   TURMERIC PO Take 450 mg by mouth daily with supper.     insulin  glargine (LANTUS  SOLOSTAR) 100 UNIT/ML Solostar Pen Inject 40 Units into the skin daily. 30 mL 3   No current facility-administered medications for this visit.    PHYSICAL EXAM: Vitals:   01/14/24 1445  BP: 130/70  Pulse: 97  Resp: 16  SpO2: 100%  Weight: 180 lb 6.4 oz (81.8 kg)  Height: 5' 4 (1.626 m)   Body mass index is 30.97 kg/m.  Wt Readings from Last 3 Encounters:  01/14/24 180 lb 6.4 oz (81.8 kg)  12/29/23 184 lb 12.8 oz (83.8 kg)  12/25/23 182 lb 12.8 oz (82.9 kg)    General: Well developed, well nourished female in no apparent distress.  HEENT: AT/[place], no external lesions.  Eyes: Conjunctiva clear and no icterus. Neck: Neck supple  Lungs: Respirations not labored Neurologic: Alert, oriented, normal speech Extremities / Skin: Dry.  Psychiatric: Does not appear depressed or anxious  Diabetic Foot Exam - Simple   Simple Foot Form Visual Inspection No deformities, no ulcerations, no other skin breakdown bilaterally: Yes See comments: Yes Sensation Testing Intact to touch and monofilament testing bilaterally: Yes Pulse Check Posterior Tibialis and Dorsalis pulse  intact bilaterally: Yes Comments Bilateral callus especially on right toes and left great toe medially.  No ulcer.    LABS Reviewed Lab Results  Component Value Date   HGBA1C 9.0 (A) 12/25/2023   HGBA1C 9.2 (H) 09/25/2023   HGBA1C 10.0 (A) 06/20/2023   No results found for: FRUCTOSAMINE Lab Results  Component Value Date  CHOL 281 (H) 11/14/2022   HDL 46 11/14/2022   LDLCALC 161 (H) 11/14/2022   TRIG 389 (H) 11/14/2022   CHOLHDL 6.1 (H) 11/14/2022   Lab Results  Component Value Date   MICRALBCREAT 20.6 03/18/2023   Lab Results  Component Value Date   CREATININE 1.02 (H) 08/01/2023   Lab Results  Component Value Date   GFR 60.22 06/20/2023    ASSESSMENT / PLAN  1. Uncontrolled type 2 diabetes mellitus with hyperglycemia (HCC)   2. Primary hypothyroidism      Diabetes Mellitus type 2, complicated by CAD - Diabetic status / severity: Uncontrolled, improving.  Lab Results  Component Value Date   HGBA1C 9.0 (A) 12/25/2023    - Hemoglobin A1c goal : <6.5%  GMI on CGM 7.8%.  Still with postprandial hyperglycemia. Consider mealtime insulin  in the future visit, if not optimal improvement.  Adjusted diabetes regimen as follows.  - Medications: See below.  I) decrease Lantus  48 to 40 units daily at bedtime. II) increase Ozempic  from 1 to 2 mg weekly. III) continue glipizide  extended release 20 mg daily for now.  Consider decreasing in the future. IV) continue Jardiance  25 mg daily.  - Home glucose testing: Continue Dexcom G7 and check as needed.  - Discussed/ Gave Hypoglycemia treatment plan.  # Consult : No consult at this time.  Offered dietitian visit in the last visit patient declined.  # Annual urine for microalbuminuria/ creatinine ratio, no microalbuminuria currently, continue jardiance . Last  Lab Results  Component Value Date   MICRALBCREAT 20.6 03/18/2023    # Foot check nightly.  Patient on gabapentin  for headache and joint pain.  #  Annual dilated diabetic eye exams.   - Diet: Make healthy diabetic food choices, discussed in detail. - Life style / activity / exercise: Discussed.  2. Blood pressure  -  BP Readings from Last 1 Encounters:  01/14/24 130/70    - Control is in target.  - No change in current plans.  3. Lipid status / Hyperlipidemia - Last  Lab Results  Component Value Date   LDLCALC 161 (H) 11/14/2022   - Continue rosuvastatin  40 mg daily.  Managed by PCP /cardiology.  Diagnoses and all orders for this visit:  Uncontrolled type 2 diabetes mellitus with hyperglycemia (HCC) -     Semaglutide , 2 MG/DOSE, 8 MG/3ML SOPN; Inject 2 mg as directed once a week. -     insulin  glargine (LANTUS  SOLOSTAR) 100 UNIT/ML Solostar Pen; Inject 40 Units into the skin daily.  Primary hypothyroidism   DISPOSITION Follow up in clinic in 3 months suggested.   All questions answered and patient verbalized understanding of the plan.  Raynesha Tiedt, MD [facility] [address] [place], [address] [place], [place] [address] Phone # [phone]  At least part of this note was generated using voice recognition software. Inadvertent word errors may have occurred, which were not recognized during the proofreading process. "

## 2024-01-16 ENCOUNTER — Encounter: Payer: Self-pay | Admitting: Family

## 2024-01-16 NOTE — Progress Notes (Signed)
 "  Office Visit Note   Patient: Felicia [name]           Date of Birth: Nov 15, 1950           MRN: [id number] Visit Date: 01/14/2024              Requested by: Allwardt, Mardy HERO, PA-C [address] [place],  [place] [address] PCP: Kathrene Mardy HERO, PA-C  Chief Complaint  Patient presents with   Right Ankle - Follow-up    08/01/2023 ORIF right ankle      HPI: The patient is a 74 year old woman who presents today for reevaluation of right ankle she is status post ORIF for lateral ankle fracture in July of this year.  Surgery on July 18.  She is having pain and new spasming to the posterior lateral ankle she feels she can feel 2 prominent screws along the lateral plate she is having pain which intermittently extends around her heel worse with weightbearing having issues at rest as well complains of pain from shoewear rubbing on the lateral ankle  Assessment & Plan: Visit Diagnoses:  1. Closed fracture of right ankle with delayed healing, subsequent encounter     Plan: Discussed case with Dr. Harden plan to proceed with hardware removal right ankle.  Patient in agreement with the plan  Follow-Up Instructions: No follow-ups on file.   Ortho Exam  Patient is alert, oriented, no adenopathy, well-dressed, normal affect, normal respiratory effort. On examination right ankle the surgical incision is well-healed there is no edema or erythema she has 2 prominent palpable screw heads laterally the plate is palpable as well through the skin there is no tenting of the skin no impending ulceration  Palpable dorsalis pedis pulse and posterior tibial pulse.    Imaging: No results found. No images are attached to the encounter.  Labs: Lab Results  Component Value Date   HGBA1C 9.0 (A) 12/25/2023   HGBA1C 9.2 (H) 09/25/2023   HGBA1C 10.0 (A) 06/20/2023   ESRSEDRATE 24 01/31/2023   ESRSEDRATE 7 11/27/2021   ESRSEDRATE 33 (H) 04/19/2021   CRP <1.0 06/20/2023   CRP <1.0 04/19/2021      Lab Results  Component Value Date   ALBUMIN 3.9 08/01/2023   ALBUMIN 4.4 06/20/2023   ALBUMIN 4.4 12/18/2022    Lab Results  Component Value Date   MG 2.0 05/01/2020   Lab Results  Component Value Date   VD25OH 76.33 06/06/2022   VD25OH 112.91 (HH) 03/26/2022    No results found for: PREALBUMIN    Latest Ref Rng & Units 08/01/2023    6:31 PM 08/01/2023    7:40 AM 06/20/2023    2:22 PM  CBC EXTENDED  WBC 4.0 - 10.5 K/uL 20.9  19.4  14.9   RBC 3.87 - 5.11 MIL/uL 4.71  4.45  4.63   Hemoglobin 12.0 - 15.0 g/dL 85.4  86.0  85.6   HCT 36.0 - 46.0 % 45.0  42.8  43.4   Platelets 150 - 400 K/uL 302  283  257.0   NEUT# 1.7 - 7.7 K/uL  8.7  3.6   Lymph# 0.7 - 4.0 K/uL  9.7  10.5      There is no height or weight on file to calculate BMI.  Orders:  Orders Placed This Encounter  Procedures   XR Ankle Complete Right   No orders of the defined types were placed in this encounter.    Procedures: No procedures performed  Clinical Data: No  additional findings.  ROS:  All other systems negative, except as noted in the HPI. Review of Systems  Objective: Vital Signs: There were no vitals taken for this visit.  Specialty Comments:  No specialty comments available.  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   Erosive osteoarthritis of multiple sites 09/26/2021   Frequent headaches 05/31/2021   Bilateral impacted cerumen 02/22/2021   Nasal septal perforation 02/22/2021   Postnasal drip 02/22/2021   Hoarseness  01/25/2021   Hyperglycemia due to type 2 diabetes mellitus (HCC) 01/18/2021   Mixed simple and mucopurulent chronic bronchitis (HCC) 12/27/2020   Centrilobular emphysema (HCC) 07/31/2020   Shortness of breath 07/31/2020   Tongue lesion 04/30/2020   Hypothyroid    Anxiety    Anxiety and depression    Port-A-Cath in place 11/24/2019   Hepatic steatosis 09/28/2019   Malignant neoplasm of upper-outer quadrant of left breast in female, estrogen receptor positive (HCC) 09/02/2019   Trigger index finger of right hand 05/05/2019   Primary osteoarthritis of first carpometacarpal joint of right hand 05/05/2019   Osteoarthritis of finger of right hand 05/05/2019   Endometrial cancer (HCC) 10/23/2018   Diarrhea 10/23/2018   Chest pain 02/15/2018   Coronary artery disease involving native coronary artery of native heart without angina pectoris 11/18/2017   Hyperlipidemia LDL goal <70 11/18/2017   Diabetes mellitus treated with insulin  and oral medication (HCC) 11/18/2017   Dizziness 11/18/2017   Type 2 diabetes mellitus without complication, without long-term current use of insulin  (HCC) 11/18/2017   Past Medical History:  Diagnosis Date   Adenomatous colon polyp 2015   Anemia    Anxiety    Breast cancer (HCC) 09/2019   left breast IMC   CAD (coronary artery disease)    a. 3/2007s/p DES to the LAD ([place] ); b. 01/2017 MV: EF 80%, small, mild apical ant defect w/o ischemia (felt to be breast atten). Low risk.   Depression    DM (diabetes mellitus) (HCC)    Emphysema lung (HCC)    Essential hypertension    GERD (gastroesophageal reflux disease)    Headache    secondary to neck surgery per patient   High triglycerides    HTN (hypertension)    Hyperlipidemia    Hypothyroid    OA (osteoarthritis)    right knee,hands   Overflow incontinence    Pneumonia 2003   PONV (postoperative nausea and vomiting)    s/p gallbladder    RLS (restless legs syndrome)    Urinary urgency     Uterine cancer (HCC) dx'd 2014    Family History  Problem Relation Age of Onset   AAA (abdominal aortic aneurysm) Mother    Thyroid  disease Mother    Colon cancer Mother    Diabetes Father    Alzheimer's disease Father    Arthritis Sister    Heart disease Sister    Throat cancer Sister    Kidney failure Sister    Diabetes Sister    Diabetes Brother    Diabetes Brother    Breast cancer Cousin  Past Surgical History:  Procedure Laterality Date   BREAST LUMPECTOMY WITH RADIOACTIVE SEED AND SENTINEL LYMPH NODE BIOPSY Left 10/07/2019   Procedure: LEFT BREAST LUMPECTOMY WITH RADIOACTIVE SEED AND SENTINEL LYMPH NODE BIOPSY;  Surgeon: Ebbie Cough, MD;  Location: [facility];  Service: General;  Laterality: Left;   CHOLECYSTECTOMY     COLONOSCOPY     CORONARY STENT PLACEMENT     CORONARY/GRAFT ACUTE MI REVASCULARIZATION     FINGER ARTHRODESIS Right 06/11/2019   Procedure: ARTHRODESIS INDEX FINGER DISTAL PHALANGEAL JOINT;  Surgeon: Murrell Drivers, MD;  Location: [facility];  Service: Orthopedics;  Laterality: Right;   LEFT HEART CATH AND CORONARY ANGIOGRAPHY N/A 02/16/2018   Procedure: LEFT HEART CATH AND CORONARY ANGIOGRAPHY;  Surgeon: Court Dorn PARAS, MD;  Location: [facility];  Service: Cardiovascular;  Laterality: N/A;   LEFT HEART CATH AND CORONARY ANGIOGRAPHY N/A 05/01/2020   Procedure: LEFT HEART CATH AND CORONARY ANGIOGRAPHY;  Surgeon: Court Dorn PARAS, MD;  Location: [facility];  Service: Cardiovascular;  Laterality: N/A;   ORIF ANKLE FRACTURE Right 08/01/2023   Procedure: OPEN REDUCTION INTERNAL FIXATION (ORIF) ANKLE FRACTURE, RIGHT;  Surgeon: Harden Jerona GAILS, MD;  Location: [facility];  Service: Orthopedics;  Laterality: Right;  RIGHT ORIF DISTAL FIBULA FRACTURE   PORTACATH PLACEMENT Right 11/23/2019   Procedure: INSERTION PORT-A-CATH WITH ULTRASOUND GUIDANCE;  Surgeon: Ebbie Cough, MD;  Location: [facility];   Service: General;  Laterality: Right;   POSTERIOR FUSION CERVICAL SPINE     C3-C7   REPLACEMENT TOTAL KNEE Right    SPINE SURGERY     TOTAL ABDOMINAL HYSTERECTOMY     FOR UTERUS CANCER   TOTAL HIP ARTHROPLASTY Left 2015   Social History   Occupational History   Occupation: retired  Tobacco Use   Smoking status: Never    Passive exposure: Past   Smokeless tobacco: Never  Vaping Use   Vaping status: Never Used  Substance and Sexual Activity   Alcohol use: Not Currently    Comment: OCCASIONALLY   Drug use: No   Sexual activity: Not Currently    Birth control/protection: Surgical    Comment: hyst       "

## 2024-01-19 ENCOUNTER — Other Ambulatory Visit (HOSPITAL_COMMUNITY): Payer: Self-pay

## 2024-01-19 ENCOUNTER — Other Ambulatory Visit: Payer: Self-pay

## 2024-01-20 ENCOUNTER — Other Ambulatory Visit (HOSPITAL_COMMUNITY): Payer: Self-pay

## 2024-01-22 ENCOUNTER — Encounter: Payer: Self-pay | Admitting: Gastroenterology

## 2024-01-22 ENCOUNTER — Ambulatory Visit: Admitting: Gastroenterology

## 2024-01-22 ENCOUNTER — Other Ambulatory Visit: Payer: Self-pay

## 2024-01-22 VITALS — BP 108/70 | HR 67 | Ht 64.0 in | Wt 181.0 lb

## 2024-01-22 DIAGNOSIS — J432 Centrilobular emphysema: Secondary | ICD-10-CM | POA: Diagnosis not present

## 2024-01-22 DIAGNOSIS — Z860100 Personal history of colon polyps, unspecified: Secondary | ICD-10-CM

## 2024-01-22 DIAGNOSIS — J029 Acute pharyngitis, unspecified: Secondary | ICD-10-CM | POA: Diagnosis not present

## 2024-01-22 DIAGNOSIS — R09A2 Foreign body sensation, throat: Secondary | ICD-10-CM | POA: Diagnosis not present

## 2024-01-22 DIAGNOSIS — E119 Type 2 diabetes mellitus without complications: Secondary | ICD-10-CM

## 2024-01-22 DIAGNOSIS — Z79899 Other long term (current) drug therapy: Secondary | ICD-10-CM

## 2024-01-22 DIAGNOSIS — Z7984 Long term (current) use of oral hypoglycemic drugs: Secondary | ICD-10-CM | POA: Diagnosis not present

## 2024-01-22 DIAGNOSIS — R1319 Other dysphagia: Secondary | ICD-10-CM

## 2024-01-22 DIAGNOSIS — Z794 Long term (current) use of insulin: Secondary | ICD-10-CM | POA: Diagnosis not present

## 2024-01-22 DIAGNOSIS — R490 Dysphonia: Secondary | ICD-10-CM

## 2024-01-22 MED ORDER — NA SULFATE-K SULFATE-MG SULF 17.5-3.13-1.6 GM/177ML PO SOLN
1.0000 | ORAL | 0 refills | Status: AC
  Filled 2024-01-22 – 2024-02-17 (×3): qty 354, 1d supply, fill #0

## 2024-01-22 NOTE — Patient Instructions (Signed)
 _______________________________________________________  If your blood pressure at your visit was 140/90 or greater, please contact your primary care physician to follow up on this.  _______________________________________________________  If you are age 75 or older, your body mass index should be between 23-30. Your Body mass index is 31.07 kg/m. If this is out of the aforementioned range listed, please consider follow up with your Primary Care Provider.  If you are age 75 or younger, your body mass index should be between 19-25. Your Body mass index is 31.07 kg/m. If this is out of the aformentioned range listed, please consider follow up with your Primary Care Provider.   ________________________________________________________  The [facility] providers would like to encourage you to use MYCHART to communicate with providers for non-urgent requests or questions.  Due to long hold times on the telephone, sending your provider a message by [facility] may be a faster and more efficient way to get a response.  Please allow 48 business hours for a response.  Please remember that this is for non-urgent requests.  _______________________________________________________  [facility] is using a team-based approach to care.  Your team is made up of your doctor and two to three APPS. Our APPS (Nurse Practitioners and Physician Assistants) work with your physician to ensure care continuity for you. They are fully qualified to address your health concerns and develop a treatment plan. They communicate directly with your gastroenterologist to care for you. Seeing the Advanced Practice Practitioners on your physician's team can help you by facilitating care more promptly, often allowing for earlier appointments, access to diagnostic testing, procedures, and other specialty referrals.   You have been scheduled for an endoscopy and colonoscopy. Please follow the written instructions given to you at  your visit today.  If you use inhalers (even only as needed), please bring them with you on the day of your procedure.  DO NOT TAKE 7 DAYS PRIOR TO TEST- Trulicity  (dulaglutide ) Ozempic , Wegovy  (semaglutide ) Mounjaro , Zepbound  (tirzepatide ) Bydureon Bcise (exanatide extended release)  DO NOT TAKE 1 DAY PRIOR TO YOUR TEST Rybelsus  (semaglutide ) Adlyxin (lixisenatide) Victoza (liraglutide) Byetta (exanatide) ___________________________________________________________________________  Due to recent changes in healthcare laws, you may see the results of your imaging and laboratory studies on MyChart before your provider has had a chance to review them.  We understand that in some cases there may be results that are confusing or concerning to you. Not all laboratory results come back in the same time frame and the provider may be waiting for multiple results in order to interpret others.  Please give us  48 hours in order for your provider to thoroughly review all the results before contacting the office for clarification of your results.   It was a pleasure to see you today!  Vito Cirigliano, D.O.

## 2024-01-22 NOTE — Progress Notes (Signed)
 "  Chief Complaint:    Raspy voice, sore throat, dysphagia  GI History: 74 y.o. female with a past medical history of invasive mammary carcinoma left diagnosed 02/23/19 status post chemotherapy and lumpectomy with radiation in 23-Feb-2020 on anastrozole  follows with Dr. Odean, CAD s/p DES stent 2016/02/23 on bASA, emphysema not on oxygen (no prior tobacco use, but significant 2nd had exposure), hepatic steatosis, type 2 diabetes on insulin , TMJ, ccy   Endoscopic History: - 12/2018 colonoscopy ([facility] [place] ): 1 polyp removed from hepatic flexure otherwise normal colonoscopy PATH TA polyps, recall 5 years  HPI:     Patient is a 74 y.o. female presenting to the Gastroenterology Clinic for evaluation of possible reflux symptoms and dysphagia.  Has been having sore throat, hoarseness, raspy voice.  Sxs have been present for ages. Has seen PCP (here and in [place] ), ENT (normal laryngoscopy 2022/02/22), Pulmonary Clinic.  Symptoms have been present for many years.  More recently with episodic solid food dysphagia, pointing to the left side of her throat. Sxs started about 1 month ago. Can have odynophagia at times. +globus sensation.   Had C3-7 fusion in [place]  about 2015/02/23.    Currently taking Prilosec 40 mg daily. Has been taking for years, but does not recall having history of reflux sxs. No prior EGD.   Sister deceased 2014-02-22 from Esophageal CA.   Prescribed Ozempic  weekly.   No recent upper GI or chest imaging for review.   Was previously seen by Alan Coombs, PA-C on 01/31/2023 for evaluation of chronic diarrhea.  Symptoms started during chemo/radiation for breast cancer in February 23, 2019 and have persisted.  Suspected constipation with overflow and started on fiber, MiraLAX , pelvic floor PT recommended.  Abdominal x-ray with moderate retained colonic stool.  Normal fecal fat, pancreatic elastase.  Review of systems:     No chest pain, no SOB, no fevers, no urinary sx   Past Medical  History:  Diagnosis Date   Adenomatous colon polyp 2013/02/22   Anemia    Anxiety    Breast cancer (HCC) 09/2019   left breast IMC   CAD (coronary artery disease)    a. 3/2007s/p DES to the LAD ([place] ); b. 02-22-17 MV: EF 80%, small, mild apical ant defect w/o ischemia (felt to be breast atten). Low risk.   Depression    DM (diabetes mellitus) (HCC)    Emphysema lung (HCC)    Essential hypertension    GERD (gastroesophageal reflux disease)    Headache    secondary to neck surgery per patient   High triglycerides    HTN (hypertension)    Hyperlipidemia    Hypothyroid    OA (osteoarthritis)    right knee,hands   Overflow incontinence    Pneumonia 22-Feb-2001   PONV (postoperative nausea and vomiting)    s/p gallbladder    RLS (restless legs syndrome)    Urinary urgency    Uterine cancer (HCC) dx'd 02/23/12    Patient's surgical history, family medical history, social history, medications and allergies were all reviewed in Epic    Current Outpatient Medications  Medication Sig Dispense Refill   acetaminophen  (TYLENOL ) 500 MG tablet Take 500 mg by mouth every 6 (six) hours as needed for mild pain (pain score 1-3) or moderate pain (pain score 4-6).     albuterol  (VENTOLIN  HFA) 108 (90 Base) MCG/ACT inhaler Inhale 2 puffs into the lungs every 6 (six) hours as needed for wheezing or shortness of breath. 6.7 g 2  anastrozole  (ARIMIDEX ) 1 MG tablet Take 1 tablet (1 mg total) by mouth daily. 90 tablet 4   ascorbic acid (VITAMIN C) 500 MG tablet Take 500 mg by mouth daily.     aspirin  EC (ASPIRIN  LOW DOSE) 81 MG tablet Take 1 tablet (81 mg total) by mouth in the morning. 90 tablet 3   azelastine  (ASTELIN ) 0.1 % nasal spray Place 2 sprays into both nostrils 2 (two) times daily. Use in each nostril as directed 30 mL 11   blood glucose meter kit and supplies Dispense based on patient and insurance preference. Use up to four times daily as directed. (FOR ICD-10 E10.9, E11.9). 1 each 0   buPROPion   (WELLBUTRIN  XL) 150 MG 24 hr tablet Take 1 tablet (150 mg total) by mouth every morning for depression. 30 tablet 2   celecoxib  (CELEBREX ) 200 MG capsule Take 1 capsule (200 mg total) by mouth 2 (two) times daily. 180 capsule 1   CINNAMON PO Take 600 mg by mouth daily with supper.     clotrimazole -betamethasone  (LOTRISONE) cream Apply 1 Application topically daily as needed (Rash).     Continuous Glucose Receiver (DEXCOM G7 RECEIVER) DEVI Use to monitor glucose continuously. 1 each 0   Continuous Glucose Sensor (DEXCOM G7 SENSOR) MISC Change sensor every 10 days. 9 each 3   diclofenac  sodium (VOLTAREN ) 1 % GEL Apply 2-4 g topically 4 (four) times daily as needed (as directed for pain).     empagliflozin  (JARDIANCE ) 25 MG TABS tablet Take 1 tablet (25 mg total) by mouth daily. 90 tablet 1   escitalopram  (LEXAPRO ) 20 MG tablet Take 1 tablet (20 mg total) by mouth daily. 90 tablet 1   fenofibrate  (TRICOR ) 145 MG tablet Take 1 tablet (145 mg total) by mouth at bedtime. 90 tablet 3   Ferrous Sulfate (IRON PO) Take 1 tablet by mouth daily. alternates days:1 tablet one day and 2 tablets the next day     FIBER PO Take 1 tablet by mouth daily.     gabapentin  (NEURONTIN ) 600 MG tablet Take 1 tablet (600 mg total) by mouth 3 (three) times daily. 90 tablet 5   glipiZIDE  (GLUCOTROL  XL) 10 MG 24 hr tablet Take 2 tablets (20 mg total) by mouth every morning. 90 tablet 1   glucose blood test strip Use to check blood sugar up to four times daily as directed 100 each 3   Incontinence Supply Disposable (DEPEND ADJUSTABLE UNDERWEAR) MISC 1 Application by Does not apply route daily. 16 each 11   insulin  glargine (LANTUS  SOLOSTAR) 100 UNIT/ML Solostar Pen Inject 40 Units into the skin daily. 30 mL 3   Insulin  Pen Needle (INSUPEN PEN NEEDLES) 32G X 4 MM MISC Use to inject insulin  as directed 100 each 3   ketoconazole  (NIZORAL ) 2 % cream Apply 1 Application topically daily. 60 g 0   Lancets MISC Use to check blood sugar  up to four times daily as directed 100 each 3   levothyroxine  (SYNTHROID ) 125 MCG tablet Take 1 tablet (125 mcg total) by mouth daily before breakfast. 30 tablet 2   loratadine  (ALLERGY RELIEF) 10 MG tablet Take 1 tablet (10 mg total) by mouth every evening. 90 tablet 3   magnesium  gluconate (MAGONATE) 500 (27 Mg) MG TABS tablet Take 500 mg by mouth at bedtime.     Multiple Vitamins-Calcium  (ONE-A-DAY WOMENS PO) Take 1 tablet by mouth daily.     mupirocin  cream (BACTROBAN ) 2 % Apply 1 Application topically 3 (three) times  daily. 15 g 0   nitroGLYCERIN  (NITROSTAT ) 0.4 MG SL tablet Dissolve 1 tablet under the tongue every 5 minutes as needed for chest pain. Max of 3 doses, then 911. 75 tablet 2   nortriptyline  (PAMELOR ) 10 MG capsule Take 1 capsule (10 mg total) by mouth at bedtime. 30 capsule 5   omeprazole  (PRILOSEC) 40 MG capsule Take 1 capsule (40 mg total) by mouth every evening. 90 capsule 1   polyethylene glycol powder (MIRALAX ) 17 GM/SCOOP powder Take 17 g by mouth daily. 255 g 11   Potassium 99 MG TABS Take 99 mg by mouth at bedtime.     rOPINIRole  (REQUIP ) 1 MG tablet Take 1 tablet (1 mg total) by mouth at bedtime. 90 tablet 1   rosuvastatin  (CRESTOR ) 40 MG tablet Take 1 tablet (40 mg total) by mouth daily. 90 tablet 3   Semaglutide , 2 MG/DOSE, 8 MG/3ML SOPN Inject 2 mg as directed once a week. 9 mL 3   Tiotropium Bromide-Olodaterol (STIOLTO RESPIMAT ) 2.5-2.5 MCG/ACT AERS Inhale 2 puffs into the lungs daily. 4 g 11   tiZANidine  (ZANAFLEX ) 2 MG tablet Take 1 tablet (2 mg total) by mouth 3 (three) times daily. 90 tablet 5   TURMERIC PO Take 450 mg by mouth daily with supper.     No current facility-administered medications for this visit.    Physical Exam:     BP 108/70   Pulse 67   Ht 5' 4 (1.626 m)   Wt 181 lb (82.1 kg)   SpO2 99%   BMI 31.07 kg/m   GENERAL:  Pleasant female in NAD PSYCH: : Cooperative, normal affect Musculoskeletal:  Normal muscle tone, normal  strength NEURO: Alert and oriented x 3, no focal neurologic deficits   IMPRESSION and PLAN:    1) Dysphagia 2) Raspy voice 3) Hoarseness 4) Globus sensation 5) Sore throat Discussed possible diagnosis of GERD at length today, to include pathophysiology of reflux and typical versus atypical reflux symptoms.  Does not have a history of classic reflux symptoms, but has been on Prilosec 40 mg daily for years.  Curious if her many years of symptoms are all extra esophageal manifestations of reflux or unrelated.  Plan for the following: - EGD with esophageal dilation and/or biopsies as appropriate - Evaluate for erosive esophagitis, LES laxity, hiatal hernia, Barrett's Esophagus at time of EGD - Continue Prilosec 40 mg daily for now - Depending on endoscopic findings, can discuss role/utility of medication change vs further evaluation with esophageal manometry and pH/impedance testing vs esophagram  6) History of colon polyps - Due for repeat colonoscopy for ongoing polyp surveillance - Schedule colonoscopy  7) Emphysema - Not prescribed oxygen and no history of difficult airway documented or per patient     8) Diabetes 9) Medication management - Hold Ozempic  1 week prior to procedures  The indications, risks, and benefits of EGD and colonoscopy were explained to the patient in detail. Risks include but are not limited to bleeding, perforation, adverse reaction to medications, and cardiopulmonary compromise. Sequelae include but are not limited to the possibility of surgery, hospitalization, and mortality. The patient verbalized understanding and wished to proceed. All questions answered, referred to scheduler and bowel prep ordered. Further recommendations pending results of the exam.        Sandor LULLA Flatter ,DO, FACG 01/22/2024, 3:15 PM  "

## 2024-01-23 ENCOUNTER — Other Ambulatory Visit: Payer: Self-pay

## 2024-01-25 ENCOUNTER — Other Ambulatory Visit: Payer: Self-pay | Admitting: Physician Assistant

## 2024-01-25 ENCOUNTER — Other Ambulatory Visit: Payer: Self-pay

## 2024-01-26 ENCOUNTER — Other Ambulatory Visit: Payer: Self-pay

## 2024-01-26 ENCOUNTER — Other Ambulatory Visit (HOSPITAL_COMMUNITY): Payer: Self-pay

## 2024-01-26 MED ORDER — OMEPRAZOLE 40 MG PO CPDR
40.0000 mg | DELAYED_RELEASE_CAPSULE | Freq: Every evening | ORAL | 1 refills | Status: AC
  Filled 2024-01-26: qty 90, 90d supply, fill #0

## 2024-01-27 ENCOUNTER — Other Ambulatory Visit: Payer: Self-pay

## 2024-01-28 ENCOUNTER — Ambulatory Visit: Admitting: Family Medicine

## 2024-01-28 ENCOUNTER — Other Ambulatory Visit: Payer: Self-pay

## 2024-01-28 ENCOUNTER — Ambulatory Visit

## 2024-01-28 ENCOUNTER — Encounter: Payer: Self-pay | Admitting: Physician Assistant

## 2024-01-28 VITALS — BP 122/76 | HR 93 | Ht 64.0 in | Wt 179.0 lb

## 2024-01-28 DIAGNOSIS — M542 Cervicalgia: Secondary | ICD-10-CM

## 2024-01-28 DIAGNOSIS — M79642 Pain in left hand: Secondary | ICD-10-CM | POA: Diagnosis not present

## 2024-01-28 DIAGNOSIS — M25571 Pain in right ankle and joints of right foot: Secondary | ICD-10-CM

## 2024-01-28 DIAGNOSIS — M8580 Other specified disorders of bone density and structure, unspecified site: Secondary | ICD-10-CM | POA: Diagnosis not present

## 2024-01-28 DIAGNOSIS — M5416 Radiculopathy, lumbar region: Secondary | ICD-10-CM

## 2024-01-28 DIAGNOSIS — M8000XG Age-related osteoporosis with current pathological fracture, unspecified site, subsequent encounter for fracture with delayed healing: Secondary | ICD-10-CM

## 2024-01-28 DIAGNOSIS — M545 Low back pain, unspecified: Secondary | ICD-10-CM

## 2024-01-28 DIAGNOSIS — M67442 Ganglion, left hand: Secondary | ICD-10-CM | POA: Diagnosis not present

## 2024-01-28 DIAGNOSIS — G8929 Other chronic pain: Secondary | ICD-10-CM

## 2024-01-28 NOTE — Progress Notes (Signed)
 "        I, Felicia Collet, PhD, LAT, ATC acting as a scribe for Artist Lloyd, MD.  Felicia [name] is a 74 y.o. female who presents to [facility] today for R ankle, back, and L hand pain. Pt was last seen by Dr. Lloyd on 05/26/23 and a lumbar ESI was ordered.  Pt underwent an ORIF of her R ankle in July 2025 and had a f/u visit on Dec 31st.  Today, pt reports orthopedic surgery wants to remove the parts of her ORIF in her ankle.   Pt c/o low back pain ongoing for about a month. Pain is located along the L-side of her low back w/ radiating pain along the posterior aspect of her L thigh. This is the opposite side that was previously addressed. Last lumbar ESI, 06/03/23.  She also c/o L hand pain x about 1 month intermittently. She note pain when trying to hold the steering wheel. Pain is located around the MCP joint of her 2nd finger.   Dx testing: 01/14/24 R ankle XR  Pertinent review of systems: No fevers or chills  Relevant historical information: Diabetes.  Right ankle fracture July 2025   Exam:  BP 122/76   Pulse 93   Ht 5' 4 (1.626 m)   Wt 179 lb (81.2 kg)   SpO2 99%   BMI 30.73 kg/m  General: Well Developed, well nourished, and in no acute distress.   MSK: Right ankle mature scar lateral ankle.  Mildly tender palpation.  L-spine normal appearing. Nontender palpation spinal midline.  Decreased lumbar motion.  Lower extremity strength is intact.  Left hand normal appearing. Tender palpation left second palmar MCP.  Small nodule palpated at this area.  No triggering present with flexion of PIP joint.    Lab and Radiology Results  Procedure: Real-time Ultrasound Guided Injection of left second MCP ganglion cyst associated with tendon sheath at A1 pulley.  Trigger finger injection Device: Philips Affiniti 50G/GE Logiq Images permanently stored and available for review in PACS Verbal informed consent obtained.  Discussed risks and benefits of  procedure. Warned about infection, bleeding, hyperglycemia damage to structures among others. Patient expresses understanding and agreement Time-out conducted.   Noted no overlying erythema, induration, or other signs of local infection.   Skin prepped in a sterile fashion.   Local anesthesia: Topical Ethyl chloride.   With sterile technique and under real time ultrasound guidance: 40 mg of Kenalog  and 1 mL of lidocaine  injected into ganglion cyst at the A1 pulley tendon sheath. Fluid seen entering the tendon sheath.   Completed without difficulty   Pain immediately resolved suggesting accurate placement of the medication.   Advised to call if fevers/chills, erythema, induration, drainage, or persistent bleeding.   Images permanently stored and available for review in the ultrasound unit.  Impression: Technically successful ultrasound guided injection.  X-ray images lumbar spine obtained today personally and independently interpreted. DDD L5-S1.  No acute fractures are visible. Await formal radiology review    Assessment and Plan: 74 y.o. female with left hand pain due to ganglion cyst associated with the flexor tendon at the second MCP.  Plan for injection today.  Left low back pain with pain radiating down the left leg concerning for lumbar radiculopathy.  She does have a 6 lumbar spine MRI from March 2024 that does show potential nerve impingement.  Plan for epidural steroid injection and lumbar spine x-ray.  If not improved consider MRI lumbar  spine.  Hardware pain from ankle fracture.  Seen by orthopedics who is planning to proceed with removal of hardware.  That is almost certainly the source of pain and should improve with surgery.  Possible that some of her pain is lumbar radiculopathy on the right.  Additionally will update bone density test.  She did have a fracture recently and does have a history of osteopenia. PDMP not reviewed this encounter. Orders Placed This Encounter   Procedures   US  LIMITED JOINT SPACE STRUCTURES UP LEFT(NO LINKED CHARGES)    Reason for Exam (SYMPTOM  OR DIAGNOSIS REQUIRED):   left hand pain    Preferred imaging location?:   [facility] Lumbar Spine 2-3 Views    Standing Status:   Future    Number of Occurrences:   1    Expiration Date:   02/28/2024    Reason for Exam (SYMPTOM  OR DIAGNOSIS REQUIRED):   lumbar radiculopathy    Preferred imaging location?:   [facility]   DG INJECT DIAG/THERA/INC NEEDLE/CATH/PLC EPI/LUMB/SAC W/IMG    Level and technique per radiology    Standing Status:   Future    Expiration Date:   01/27/2025    Reason for Exam (SYMPTOM  OR DIAGNOSIS REQUIRED):   Low back pain    Preferred Imaging Location?:   [facility]   DG BONE DENSITY (DXA)    Standing Status:   Future    Expiration Date:   01/27/2025    Reason for Exam (SYMPTOM  OR DIAGNOSIS REQUIRED):   osteoporosis    Preferred imaging location?:   [facility]   No orders of the defined types were placed in this encounter.    Discussed warning signs or symptoms. Please see discharge instructions. Patient expresses understanding.   The above documentation has been reviewed and is accurate and complete Artist Lloyd, M.D.   "

## 2024-01-28 NOTE — Patient Instructions (Addendum)
 Thank you for coming in today.   Please get an Xray today before you leave   You received an injection today. Seek immediate medical attention if the joint becomes red, extremely painful, or is oozing fluid.   Please call [facility] ([facility]) at [phone] to schedule your spine injection.    Schedule your Bone Density test at the check out desk before you leave today.

## 2024-01-28 NOTE — Telephone Encounter (Signed)
 FYI- sent a new 2026 referral due to insurance to Sports Medicine per patient request.

## 2024-01-29 ENCOUNTER — Other Ambulatory Visit: Payer: Self-pay

## 2024-01-29 ENCOUNTER — Other Ambulatory Visit (HOSPITAL_BASED_OUTPATIENT_CLINIC_OR_DEPARTMENT_OTHER): Payer: Self-pay

## 2024-02-02 ENCOUNTER — Encounter: Payer: Self-pay | Admitting: Oncology

## 2024-02-02 ENCOUNTER — Other Ambulatory Visit: Payer: Self-pay

## 2024-02-02 ENCOUNTER — Other Ambulatory Visit (HOSPITAL_COMMUNITY): Payer: Self-pay

## 2024-02-03 ENCOUNTER — Other Ambulatory Visit (HOSPITAL_COMMUNITY): Payer: Self-pay

## 2024-02-03 ENCOUNTER — Encounter: Payer: Self-pay | Admitting: Pharmacist

## 2024-02-03 ENCOUNTER — Other Ambulatory Visit: Payer: Self-pay

## 2024-02-04 ENCOUNTER — Ambulatory Visit: Payer: Self-pay | Admitting: Family Medicine

## 2024-02-04 NOTE — Progress Notes (Signed)
 Low back x-ray shows mild arthritis

## 2024-02-05 ENCOUNTER — Other Ambulatory Visit: Payer: Self-pay

## 2024-02-05 ENCOUNTER — Other Ambulatory Visit: Payer: Self-pay | Admitting: Physician Assistant

## 2024-02-05 ENCOUNTER — Other Ambulatory Visit (HOSPITAL_COMMUNITY): Payer: Self-pay

## 2024-02-05 ENCOUNTER — Other Ambulatory Visit: Payer: Self-pay | Admitting: Pharmacist

## 2024-02-05 ENCOUNTER — Telehealth: Payer: Self-pay | Admitting: Family

## 2024-02-05 DIAGNOSIS — F32A Depression, unspecified: Secondary | ICD-10-CM

## 2024-02-05 MED ORDER — BUPROPION HCL ER (XL) 150 MG PO TB24
150.0000 mg | ORAL_TABLET | Freq: Every morning | ORAL | 2 refills | Status: AC
  Filled 2024-02-17: qty 30, 30d supply, fill #0

## 2024-02-05 NOTE — Telephone Encounter (Signed)
 Pt called saying that she saw Felicia [name] on 12/31 and hasn't heard anything back yet. Call back number is [phone].

## 2024-02-05 NOTE — Progress Notes (Signed)
 "  02/05/2024 Name: Felicia [name] MRN: [id number] DOB: 1950/12/09  Chief Complaint  Patient presents with   Diabetes   Medication Management    Felicia [name] is a 74 y.o. year old female who presented for a telephone visit.   They were referred to the pharmacist by their PCP for assistance in managing diabetes and complex medication management.    Subjective:  Care Team: Primary Care Provider: Allwardt, Mardy HERO, PA-C ; Next Scheduled Visit: 03/25/2024 GI: Dr San - will have colonoscopy and endoscopy in February 17,2026. Neuro - Dr Skeet; Next Scheduled Visit: 04/06/2024 Endo - Dr Mercie; Next Scheduled Visit: 04/07/2024 Oncology - Dr Odean; Next Scheduled Visit: 09/21/2024  Medication Access/Adherence  Current Pharmacy:  [name] [address] [place] [address] Phone: [phone] Fax: [phone]  [name] [address] [place] [address] Phone: [phone] Fax: [phone]  [facility] [place] [facility] [address] [place] [address] Phone: [phone] Fax: [phone]   Patient reports affordability concerns with their medications: No  Patient reports access/transportation concerns to their pharmacy: No  - She is using her insurance's transportation benefits Patient reports adherence concerns with their medications:  No     Diabetes: Patient was seen by Dr Mercie 01/14/2024. Medication recommendation form visit were:  I) decrease Lantus  48 to 40 units daily at bedtime. II) increase Ozempic  from 1 to 2 mg weekly. III) continue glipizide  extended release 20 mg daily for now.  Consider decreasing in the future. IV) continue Jardiance  25 mg daily.    Current medications: Ozempic  2mg  weekly. Glipizide  ER 10mg  - take 2 tablets each morning, Jardiance  25mg  daily, Lantus  - she is tapering down from 48 units to 40 units, She is currently  injection 44 units of Lantus  daily  Medications tried in the past:  Trulicity  - stopped because she had trouble getting it;  Mounjaro  - chart states she stopped due to diarrhea and night sweats but patient did not remember taking Mounjaro ;  metformin  - stopped because suspected was worsening diarrhea (fecal incontinence)   Current glucose readings: Using Dex Com 7 Continuous Glucose Monitoring system  Recent blood glucose readings - patient has been   Patient reports hypoglycemic s/sx including shakiness but no sweating. Symptoms have improved since she lowered dose of Lantus . Patient denies hyperglycemic symptoms including no polyuria, polydipsia, polyphagia, nocturia,blurred vision.  Current meal patterns:  - Breakfast: Hard boiled egg; 1 slice of toast or cottage cheese - Lunch: omelet with vegetables with 1 slice of toast - Supper: Meals on wheels - protein, vegetables and starchy. She will meet with representative tomorrow to see if she still qualifies.  - Snacks: cheese - Drinks: water   Current physical activity: none currently - having some hip pain and will see ortho in February 2026.   Current medication access support: none  Macrovascular and Microvascular Risk Reduction:  Statin? yes (rosuvastatin  - started 07/23/2023); ACEi/ARB? No - due to low BP Last urinary albumin/creatinine ratio:  Lab Results  Component Value Date   MICRALBCREAT 20.6 03/18/2023   Last [name] exam:  Lab Results  Component Value Date   HMDIABEYEEXA No Retinopathy 04/24/2022   Last foot exam: 10/23/2022 Tobacco Use:  Tobacco Use: Low Risk (01/22/2024)   Patient History    Smoking Tobacco Use: Never    Smokeless Tobacco Use: Never    Passive Exposure: Past     Objective:  BP Readings from Last 3  Encounters:  01/28/24 122/76  01/22/24 108/70  01/14/24 130/70    Lab Results  Component Value Date   HGBA1C 9.0 (A) 12/25/2023    Lab Results  Component Value Date   CREATININE 1.02 (H)  08/01/2023   BUN 21 08/01/2023   NA 135 08/01/2023   K 4.3 08/01/2023   CL 101 08/01/2023   CO2 21 (L) 08/01/2023    Lab Results  Component Value Date   CHOL 281 (H) 11/14/2022   HDL 46 11/14/2022   LDLCALC 161 (H) 11/14/2022   TRIG 389 (H) 11/14/2022   CHOLHDL 6.1 (H) 11/14/2022    Medications Reviewed Today     Reviewed by Carla Milling, RPH-CPP (Pharmacist) on 02/05/24 at 1341  Med List Status: <None>   Medication Order Taking? Sig Documenting Provider Last Dose Status Informant  acetaminophen  (TYLENOL ) 500 MG tablet [id number]  Take 500 mg by mouth every 6 (six) hours as needed for mild pain (pain score 1-3) or moderate pain (pain score 4-6). [provider]  Active Self  albuterol  (VENTOLIN  HFA) 108 (90 Base) MCG/ACT inhaler [id number]  Inhale 2 puffs into the lungs every 6 (six) hours as needed for wheezing or shortness of breath. Kassie Acquanetta Bradley, MD  Active Self  anastrozole  (ARIMIDEX ) 1 MG tablet [id number]  Take 1 tablet (1 mg total) by mouth daily. Gudena, Vinay, MD  Active   ascorbic acid (VITAMIN C) 500 MG tablet [id number]  Take 500 mg by mouth daily. [provider]  Active Self  aspirin  EC (ASPIRIN  LOW DOSE) 81 MG tablet [id number]  Take 1 tablet (81 mg total) by mouth in the morning. Allwardt, Alyssa M, PA-C  Active   azelastine  (ASTELIN ) 0.1 % nasal spray [id number]  Place 2 sprays into both nostrils 2 (two) times daily. Use in each nostril as directed Allwardt, Mardy HERO, PA-C  Active Self  blood glucose meter kit and supplies [id number]  Dispense based on patient and insurance preference. Use up to four times daily as directed. (FOR ICD-10 E10.9, E11.9). Allwardt, Mardy HERO, PA-C  Active Self  buPROPion  (WELLBUTRIN  XL) 150 MG 24 hr tablet [id number]  Take 1 tablet (150 mg total) by mouth every morning for depression. Allwardt, Alyssa M, PA-C  Active   celecoxib  (CELEBREX ) 200 MG capsule [id number]  Take 1 capsule (200 mg total) by mouth 2 (two) times daily.  Allwardt, Alyssa M, PA-C  Active   CINNAMON PO [id number]  Take 600 mg by mouth daily with supper. [provider]  Active Self  clotrimazole -betamethasone  (LOTRISONE) cream [id number]  Apply 1 Application topically daily as needed (Rash). [provider]  Active Self  Continuous Glucose Receiver (DEXCOM G7 RECEIVER) DEVI [id number]  Use to monitor glucose continuously. Thapa, Sudan, MD  Active   Continuous Glucose Sensor (DEXCOM G7 SENSOR) MISC [id number]  Change sensor every 10 days. Thapa, Sudan, MD  Active   diclofenac  sodium (VOLTAREN ) 1 % GEL [id number]  Apply 2-4 g topically 4 (four) times daily as needed (as directed for pain). [provider]  Active Self  empagliflozin  (JARDIANCE ) 25 MG TABS tablet [id number]  Take 1 tablet (25 mg total) by mouth daily. Allwardt, Alyssa M, PA-C  Active   escitalopram  (LEXAPRO ) 20 MG tablet [id number]  Take 1 tablet (20 mg total) by mouth daily. Allwardt, Mardy HERO, PA-C  Active   fenofibrate  (TRICOR ) 145 MG tablet [id number]  Take 1 tablet (145 mg total) by mouth at bedtime. Allwardt, Mardy HERO, PA-C  Active   Ferrous  Sulfate (IRON PO) [id number]  Take 1 tablet by mouth daily. alternates days:1 tablet one day and 2 tablets the next day [provider]  Active Self  FIBER PO [id number]  Take 1 tablet by mouth daily. [provider]  Active Self  gabapentin  (NEURONTIN ) 600 MG tablet [id number]  Take 1 tablet (600 mg total) by mouth 3 (three) times daily. Skeet Juliene SAUNDERS, DO  Active   glipiZIDE  (GLUCOTROL  XL) 10 MG 24 hr tablet [id number]  Take 2 tablets (20 mg total) by mouth every morning. Allwardt, Mardy HERO, PA-C  Active   glucose blood test strip [id number]  Use to check blood sugar up to four times daily as directed Allwardt, Mardy HERO, PA-C  Active Self  Incontinence Supply Disposable (DEPEND ADJUSTABLE UNDERWEAR) MISC [id number]  1 Application by Does not apply route daily. Allwardt, Mardy HERO, PA-C  Active Self  insulin  glargine  (LANTUS  SOLOSTAR) 100 UNIT/ML Solostar Pen [id number]  Inject 40 Units into the skin daily. Thapa, [place], MD  Active   Insulin  Pen Needle (INSUPEN PEN NEEDLES) 32G X 4 MM MISC [id number]  Use to inject insulin  as directed Allwardt, Mardy HERO, PA-C  Active Self  ketoconazole  (NIZORAL ) 2 % cream [id number]  Apply 1 Application topically daily. Allwardt, Mardy HERO, PA-C  Active Self  Lancets MISC [id number]  Use to check blood sugar up to four times daily as directed Allwardt, Mardy HERO, PA-C  Active Self  levothyroxine  (SYNTHROID ) 125 MCG tablet [id number]  Take 1 tablet (125 mcg total) by mouth daily before breakfast. Allwardt, Alyssa M, PA-C  Active   loratadine  (ALLERGY RELIEF) 10 MG tablet [id number]  Take 1 tablet (10 mg total) by mouth every evening. Allwardt, Mardy HERO, PA-C  Active   magnesium  gluconate (MAGONATE) 500 (27 Mg) MG TABS tablet [id number]  Take 500 mg by mouth at bedtime. [provider]  Active Self  Multiple Vitamins-Calcium  (ONE-A-DAY WOMENS PO) [id number]  Take 1 tablet by mouth daily. [provider]  Active Self  mupirocin  cream (BACTROBAN ) 2 % [id number]  Apply 1 Application topically 3 (three) times daily. Valdemar Rocky SAUNDERS, NP  Active   Na Sulfate-K Sulfate-Mg Sulfate concentrate (SUPREP BOWEL PREP KIT) 17.5-3.13-1.6 GM/177ML SOLN [id number]  Take 1 kit (354 mLs total) by mouth as directed. Cirigliano, Vito V, DO  Active   nitroGLYCERIN  (NITROSTAT ) 0.4 MG SL tablet [id number]  Dissolve 1 tablet under the tongue every 5 minutes as needed for chest pain. Max of 3 doses, then 911. Nahser, Aleene PARAS, MD  Active Self  nortriptyline  (PAMELOR ) 10 MG capsule [id number]  Take 1 capsule (10 mg total) by mouth at bedtime. Skeet Juliene SAUNDERS, DO  Active   omeprazole  (PRILOSEC) 40 MG capsule [id number]  Take 1 capsule (40 mg total) by mouth every evening. Allwardt, Mardy HERO, PA-C  Active   polyethylene glycol powder (MIRALAX ) 17 GM/SCOOP powder [id number]  Take 17 g by mouth daily. Craig Alan SAUNDERS, [place]  Active Self  Potassium 99 MG TABS [id number]  Take 99 mg by mouth at bedtime. [provider]  Active Self  rOPINIRole  (REQUIP ) 1 MG tablet [id number]  Take 1 tablet (1 mg total) by mouth at bedtime. Allwardt, Alyssa M, PA-C  Active   rosuvastatin  (CRESTOR ) 40 MG tablet [id number]  Take 1 tablet (40 mg total) by mouth daily. Mona Vinie BROCKS, MD  Active Self  Semaglutide , 2 MG/DOSE, 8 MG/3ML SOPN [id number]  Inject 2 mg as directed once a week. Thapa, [place], MD  Active  Tiotropium Bromide-Olodaterol (STIOLTO RESPIMAT ) 2.5-2.5 MCG/ACT AERS [id number]  Inhale 2 puffs into the lungs daily. Kassie Acquanetta Bradley, MD  Active Self  tiZANidine  (ZANAFLEX ) 2 MG tablet [id number]  Take 1 tablet (2 mg total) by mouth 3 (three) times daily. Skeet Juliene SAUNDERS, DO  Active   TURMERIC PO [id number]  Take 450 mg by mouth daily with supper. [provider]  Active Self  Med List Note Christie Alyson Sola 07/31/23 9041): [place]              Assessment/Plan:   Diabetes: - Currently uncontrolled; goal A1c <7%. Cardiorenal risk reduction is optimized.. Blood pressure is at goal <130/80. LDL is not at goal. Since last Lipid panel she has started statin. Due to recheck lipids in March 2026.   Reviewed goal A1c, goal fasting, and goal 2 hour post prandial glucose. Recommended to check glucose several times a day and Reviewed dietary modifications including limiting intake of sweets; increase non starchy vegetables and lean proteins. - Reviewed dosing for current diabetes medications. Continue to follow up with Dr Mercie.   Hypertension: - Currently controlled - Recommend to continue bisoprolol      Health Maintenance - Answered patient's questions about flu and Shinlges vaccines.   Follow Up Plan: 1 to 2 months.   Madelin Ray, PharmD Clinical Pharmacist [facility]  Population Health [phone]    "

## 2024-02-06 ENCOUNTER — Encounter: Payer: Self-pay | Admitting: Pharmacist

## 2024-02-06 ENCOUNTER — Other Ambulatory Visit: Payer: Self-pay

## 2024-02-06 NOTE — Telephone Encounter (Signed)
 SW pt, let her know she will get a call from [place] in the near future.

## 2024-02-06 NOTE — Telephone Encounter (Signed)
 She found the sheet

## 2024-02-07 ENCOUNTER — Other Ambulatory Visit (HOSPITAL_COMMUNITY): Payer: Self-pay

## 2024-02-09 ENCOUNTER — Encounter: Payer: Self-pay | Admitting: Oncology

## 2024-02-09 ENCOUNTER — Other Ambulatory Visit (HOSPITAL_COMMUNITY): Payer: Self-pay

## 2024-02-10 ENCOUNTER — Encounter: Payer: Self-pay | Admitting: Pharmacist

## 2024-02-10 ENCOUNTER — Other Ambulatory Visit: Payer: Self-pay

## 2024-02-11 ENCOUNTER — Other Ambulatory Visit: Payer: Self-pay

## 2024-02-11 ENCOUNTER — Ambulatory Visit
Admission: RE | Admit: 2024-02-11 | Discharge: 2024-02-11 | Disposition: A | Source: Ambulatory Visit | Attending: Physician Assistant

## 2024-02-11 DIAGNOSIS — M8000XG Age-related osteoporosis with current pathological fracture, unspecified site, subsequent encounter for fracture with delayed healing: Secondary | ICD-10-CM

## 2024-02-11 DIAGNOSIS — M810 Age-related osteoporosis without current pathological fracture: Secondary | ICD-10-CM | POA: Diagnosis not present

## 2024-02-11 NOTE — Discharge Instructions (Signed)

## 2024-02-12 ENCOUNTER — Ambulatory Visit
Admission: RE | Admit: 2024-02-12 | Discharge: 2024-02-12 | Disposition: A | Discharge status: Home / Self Care | Source: Ambulatory Visit | Attending: Family Medicine | Admitting: Family Medicine

## 2024-02-12 DIAGNOSIS — M5416 Radiculopathy, lumbar region: Secondary | ICD-10-CM

## 2024-02-12 MED ORDER — METHYLPREDNISOLONE ACETATE 40 MG/ML INJ SUSP (RADIOLOG
80.0000 mg | Freq: Once | INTRAMUSCULAR | Status: AC
  Administered 2024-02-12: 80 mg via EPIDURAL

## 2024-02-12 MED ORDER — IOPAMIDOL (ISOVUE-M 200) INJECTION 41%
1.0000 mL | Freq: Once | INTRAMUSCULAR | Status: AC
  Administered 2024-02-12: 1 mL via EPIDURAL

## 2024-02-13 ENCOUNTER — Other Ambulatory Visit: Payer: Self-pay

## 2024-02-13 ENCOUNTER — Telehealth: Payer: Self-pay | Admitting: Family Medicine

## 2024-02-13 NOTE — Telephone Encounter (Addendum)
 Patient called and she got her shot in her back yesterday. She is planining on having ankle surgery next Friday. The doctor that did her shot told her to call Dr. Joane to ask if he recommended she go forward with the ankle surgery or if she should wait on doing that due to her shot she just got in her spine and her being a diabetic as well. Please advise.

## 2024-02-13 NOTE — Progress Notes (Signed)
 Bone density looks okay.  No osteoporosis.

## 2024-02-14 ENCOUNTER — Other Ambulatory Visit (HOSPITAL_COMMUNITY): Payer: Self-pay

## 2024-02-16 ENCOUNTER — Other Ambulatory Visit: Payer: Self-pay

## 2024-02-17 ENCOUNTER — Ambulatory Visit: Admitting: Rehabilitative and Restorative Service Providers"

## 2024-02-17 ENCOUNTER — Other Ambulatory Visit: Payer: Self-pay

## 2024-02-17 NOTE — Telephone Encounter (Signed)
 Should be okay to have surgery.  Double check blood sugar prior to surgery

## 2024-02-18 ENCOUNTER — Other Ambulatory Visit: Payer: Self-pay

## 2024-02-18 NOTE — H&P (Signed)
 Felicia [name] is an 74 y.o. female.   Chief Complaint: right ankle pain with retained hardware  HPI: The patient is a 74 year old woman who presents today for reevaluation of right ankle she is status post ORIF for lateral ankle fracture in July of this year.  Surgery on July 18.   She is having pain and new spasming to the posterior lateral ankle she feels she can feel 2 prominent screws along the lateral plate she is having pain which intermittently extends around her heel worse with weightbearing having issues at rest as well complains of pain from shoewear rubbing on the lateral ankle  Past Medical History:  Diagnosis Date   Adenomatous colon polyp 2015   Anemia    Anxiety    Breast cancer (HCC) 09/2019   left breast IMC   CAD (coronary artery disease)    a. 3/2007s/p DES to the LAD ([place] ); b. 01/2017 MV: EF 80%, small, mild apical ant defect w/o ischemia (felt to be breast atten). Low risk.   Depression    DM (diabetes mellitus) (HCC)    Emphysema lung (HCC)    Essential hypertension    GERD (gastroesophageal reflux disease)    Headache    secondary to neck surgery per patient   High triglycerides    HTN (hypertension)    Hyperlipidemia    Hypothyroid    OA (osteoarthritis)    right knee,hands   Overflow incontinence    Pneumonia 2003   PONV (postoperative nausea and vomiting)    s/p gallbladder    RLS (restless legs syndrome)    Urinary urgency    Uterine cancer (HCC) dx'd 2014    Past Surgical History:  Procedure Laterality Date   BREAST LUMPECTOMY WITH RADIOACTIVE SEED AND SENTINEL LYMPH NODE BIOPSY Left 10/07/2019   Procedure: LEFT BREAST LUMPECTOMY WITH RADIOACTIVE SEED AND SENTINEL LYMPH NODE BIOPSY;  Surgeon: Ebbie Cough, MD;  Location: [facility];  Service: General;  Laterality: Left;   CHOLECYSTECTOMY     COLONOSCOPY     CORONARY STENT PLACEMENT     CORONARY/GRAFT ACUTE MI REVASCULARIZATION     FINGER ARTHRODESIS Right  06/11/2019   Procedure: ARTHRODESIS INDEX FINGER DISTAL PHALANGEAL JOINT;  Surgeon: Murrell Drivers, MD;  Location: [facility];  Service: Orthopedics;  Laterality: Right;   LEFT HEART CATH AND CORONARY ANGIOGRAPHY N/A 02/16/2018   Procedure: LEFT HEART CATH AND CORONARY ANGIOGRAPHY;  Surgeon: Court Dorn JINNY, MD;  Location: [facility];  Service: Cardiovascular;  Laterality: N/A;   LEFT HEART CATH AND CORONARY ANGIOGRAPHY N/A 05/01/2020   Procedure: LEFT HEART CATH AND CORONARY ANGIOGRAPHY;  Surgeon: Court Dorn JINNY, MD;  Location: [facility];  Service: Cardiovascular;  Laterality: N/A;   ORIF ANKLE FRACTURE Right 08/01/2023   Procedure: OPEN REDUCTION INTERNAL FIXATION (ORIF) ANKLE FRACTURE, RIGHT;  Surgeon: Harden Jerona GAILS, MD;  Location: [facility];  Service: Orthopedics;  Laterality: Right;  RIGHT ORIF DISTAL FIBULA FRACTURE   PORTACATH PLACEMENT Right 11/23/2019   Procedure: INSERTION PORT-A-CATH WITH ULTRASOUND GUIDANCE;  Surgeon: Ebbie Cough, MD;  Location: [facility];  Service: General;  Laterality: Right;   POSTERIOR FUSION CERVICAL SPINE     C3-C7   REPLACEMENT TOTAL KNEE Right    SPINE SURGERY     TOTAL ABDOMINAL HYSTERECTOMY     FOR UTERUS CANCER   TOTAL HIP ARTHROPLASTY Left 2015    Family History  Problem Relation Age of Onset   AAA (  abdominal aortic aneurysm) Mother    Thyroid  disease Mother    Colon cancer Mother    Diabetes Father    Alzheimer's disease Father    Arthritis Sister    Heart disease Sister    Throat cancer Sister    Kidney failure Sister    Diabetes Sister    Diabetes Brother    Diabetes Brother    Breast cancer Cousin    Social History:  reports that she has never smoked. She has been exposed to tobacco smoke. She has never used smokeless tobacco. She reports that she does not currently use alcohol. She reports that she does not use drugs.  Allergies: Allergies[1]  No medications prior to admission.     No results found. However, due to the size of the patient record, not all encounters were searched. Please check Results Review for a complete set of results. No results found.  Review of Systems  All other systems reviewed and are negative.   There were no vitals taken for this visit. Physical Exam  Patient is alert, oriented, no adenopathy, well-dressed, normal affect, normal respiratory effort. On examination right ankle the surgical incision is well-healed there is no edema or erythema she has 2 prominent palpable screw heads laterally the plate is palpable as well through the skin there is no tenting of the skin no impending ulceration   Palpable dorsalis pedis pulse and posterior tibial pulse    Assessment/Plan History of right ankle ORIF after a fracture in July 2025.  The hardware is painful and she can feel 2 prominent screws along the lateral plate she is having pain which intermittently extends around her heel worse with weightbearing having issues at rest as well complains of pain from shoewear rubbing on the lateral ankle.  Plan removal of painful right ankle hardware on 02/20/24 by Dr. Harden.    Maurilio Deland Collet, PA-C 02/18/2024, 4:07 PM       [1]  Allergies Allergen Reactions   Chloraprep One Step [Chlorhexidine  Gluconate] Rash   Estrogens Other (See Comments)    PATIENT HAS A HISTORY OF CANCER AND HAS BEEN TOLD TO NEVER TAKE ANYTHING CONTAINING ESTROGEN, AS IT MIGHT CAUSE A RECURRENCE  Other Reaction(s): bad for cancer    Metformin  And Related Diarrhea   Other Dermatitis   Shellfish Protein-Containing Drug Products Nausea And Vomiting   Shrimp [Shellfish Allergy] Nausea And Vomiting   Doxycycline  Itching and Rash

## 2024-02-19 ENCOUNTER — Other Ambulatory Visit: Payer: Self-pay

## 2024-02-19 ENCOUNTER — Encounter (HOSPITAL_COMMUNITY): Payer: Self-pay | Admitting: Orthopedic Surgery

## 2024-02-19 NOTE — Progress Notes (Signed)
 SDW call  Patient was given pre-op instructions over the phone. Patient verbalized understanding of instructions provided.  Denied any SOB, fever or cough   PCP - [name] Cardiologist - Dr. Shelda Bruckner Pulmonary:    PPM/ICD - denies Device Orders - na Rep Notified - na   Chest x-ray -  EKG -  12/29/2023 Stress Test -04/30/2020 ECHO - 11/27/2020 Cardiac Cath - 05/01/2020 PFT: 07/31/2020  Sleep Study/sleep apnea/CPAP: denies  Type II diabetic. A1C 9.0 on 12/25/2023. CGM Dexcom 7 to left arm Fasting Blood sugar range: 90-100 How often check sugars:continuous Jardiance , hold 72 hrs, last dose 02/19/2024 Glipizide , hold DOS Lantus , use 20 units the night before surgery which is 50% of your regular dose  GLP1: Ozempic , states last dose 02/15/2024   Blood Thinner Instructions: denies Aspirin  Instructions:states last dose 02/13/2024   ERAS Protcol - Clears until 0430  Anesthesia review: Yes. CAD, HTN, DM, COPD, emphysema  Your procedure is scheduled on Friday February 20, 2024  Report to [facility] Main Entrance A at  0530  A.M., then check in with the Admitting office.  Call this number if you have problems the morning of surgery:  [phone]   If you have any questions prior to your surgery date call [phone]: Open Monday-Friday 8am-4pm If you experience any cold or flu symptoms such as cough, fever, chills, shortness of breath, etc. between now and your scheduled surgery, please notify us  at the above number    Remember:  Do not eat after midnight the night before your surgery  You may drink clear liquids until  0430   the morning of your surgery.   Clear liquids allowed are: Water , Non-Citrus Juices (without pulp), Carbonated Beverages, Clear Tea, Black Coffee ONLY (NO MILK, CREAM OR POWDERED CREAMER of any kind), and Gatorade   Take these medicines the morning of surgery with A SIP OF WATER :  Wellbutrin , gabapentin , synthroid , stiolto respimat   As  needed: Tylenol , albuterol , nitroglycerin   As of today, STOP taking any Naproxen, Ibuprofen, Motrin, Advil, Goody's, BC's, all herbal medications, fish oil, and all vitamins. This includes your Voltaren  and Celebrex 

## 2024-02-19 NOTE — Anesthesia Preprocedure Evaluation (Signed)
 "                                  Anesthesia Evaluation  Patient identified by MRN, date of birth, ID band Patient awake    Reviewed: Allergy & Precautions, NPO status , Patient's Chart, lab work & pertinent test results  History of Anesthesia Complications (+) PONV and history of anesthetic complications  Airway Mallampati: III  TM Distance: <3 FB Neck ROM: Limited    Dental  (+) Teeth Intact, Dental Advisory Given,    Pulmonary shortness of breath, neg sleep apnea, COPD, neg recent URI, neg PE   breath sounds clear to auscultation       Cardiovascular hypertension, Pt. on medications (-) angina + CAD and + Cardiac Stents  (-) CHF  Rhythm:Regular  1. Global longitudinal strain is -23.3%. Left ventricular ejection  fraction, by estimation, is 60 to 65%. The left ventricle has normal  function. The left ventricle has no regional wall motion abnormalities.  Left ventricular diastolic parameters were  normal.   2. Right ventricular systolic function is normal. The right ventricular  size is normal.   3. Trivial mitral valve regurgitation.   4. The aortic valve is tricuspid. Aortic valve regurgitation is trivial.  Aortic valve sclerosis is present, with no evidence of aortic valve  stenosis.   5. Aortic dilatation noted. There is mild dilatation of the ascending  aorta, measuring 38 mm.   6. The inferior vena cava is normal in size with greater than 50%  respiratory variability, suggesting right atrial pressure of 3 mmHg.     Neuro/Psych  Headaches, neg Seizures PSYCHIATRIC DISORDERS Anxiety Depression    C 3-7 fusion  Neuromuscular disease    GI/Hepatic Neg liver ROS,GERD  Medicated and Controlled,,  Endo/Other  diabetes, Type 2Hypothyroidism  Lab Results      Component                Value               Date                      HGBA1C                   9.0 (A)             12/25/2023             Renal/GU negative Renal ROSLab Results      Component                 Value               Date                      NA                       139                 02/20/2024                K                        4.1                 02/20/2024  CO2                      25                  02/20/2024                GLUCOSE                  152 (H)             02/20/2024                BUN                      15                  02/20/2024                CREATININE               0.80                02/20/2024                CALCIUM                   9.9                 02/20/2024                GFR                      60.22               06/20/2023                EGFR                     90                  08/06/2022                GFRNONAA                 >60                 02/20/2024                Musculoskeletal  (+) Arthritis ,    Abdominal   Peds  Hematology Lab Results      Component                Value               Date                      WBC                      18.2 (H)            02/20/2024                HGB                      14.6                02/20/2024                HCT  45.3                02/20/2024                MCV                      96.6                02/20/2024                PLT                      247                 02/20/2024               Anesthesia Other Findings   Reproductive/Obstetrics                              Anesthesia Physical Anesthesia Plan  ASA: 3  Anesthesia Plan: General   Post-op Pain Management: Minimal or no pain anticipated and Ofirmev  IV (intra-op)*   Induction: Intravenous  PONV Risk Score and Plan: 4 or greater and Ondansetron  and Dexamethasone   Airway Management Planned: LMA  Additional Equipment: None  Intra-op Plan:   Post-operative Plan: Extubation in OR  Informed Consent: I have reviewed the patients History and Physical, chart, labs and discussed the procedure including the risks, benefits and  alternatives for the proposed anesthesia with the patient or authorized representative who has indicated his/her understanding and acceptance.     Dental advisory given  Plan Discussed with: CRNA  Anesthesia Plan Comments: (PAT note by Lynwood Hope, PA-C: 74 yo female follows with cardiology for hx of CAD s/p DES to mLAD 2017, HTN, HLD. Had cath in 2020 for chest pain, 2022 after abnormal stress test. Caths both showed patent mLAD stent and no other CAD. Most recent echo 2022 with EF 60-65%, normal diastolic function, no significant valve disease. Last seen by Dr. Lonni 12/29/23. Bisoprolol  dc'd due to intermittent hypotension. No other changes to management, 1 year followup recommended.   Follows with pulmonology for hx of emphysema +/- occupational asthma +/- radiation pneumonitis. Symptoms usually worsen seasonally. Controlled on LAMA/LABA. Last seen by Dr. Kassie 10/29/23 and noted to have laryngitis at that time, treated with course of steroids.  Other pertinent hx includes PONV, IDDM2 (A1c 9.0 on 12/25/23), GERD on PPI, hypothyroidism, uterine cancer ( s/p TAH/BSO), left breast cancer (s/p left breast lumpectomy 10/07/19, chemoradiation; prior RIJ Port-a-cath).  She will need day of surgery labs and evaluation.  EKG 12/29/23: NSR. Rate 78. LAD. Voltage criteria for LVH.  PFTs 08/10/20: FVC 2.61 (87%) FEV1 2.39 (105%) Ratio 87  TLC 88% DLCO 117% Interpretation: Normal spirometry. Increased DLCO suggestive of asthma No significant bronchodilator response  Echo 11/27/20: IMPRESSIONS  1. Global longitudinal strain is -23.3%. Left ventricular ejection  fraction, by estimation, is 60 to 65%. The left ventricle has normal  function. The left ventricle has no regional wall motion abnormalities.  Left ventricular diastolic parameters were  normal.  2. Right ventricular systolic function is normal. The right ventricular  size is normal.  3. Trivial mitral valve regurgitation.   4. The aortic valve is tricuspid. Aortic valve regurgitation is trivial.  Aortic valve sclerosis is present, with no evidence of aortic valve  stenosis.  5. Aortic dilatation noted. There is mild dilatation of the  ascending  aorta, measuring 38 mm.  6. The inferior vena cava is normal in size with greater than 50%  respiratory variability, suggesting right atrial pressure of 3 mmHg.   Cardiac cath 05/01/20:  Previously placed Mid LAD stent (unknown type) is widely patent.  The left ventricular systolic function is normal.  LV end diastolic pressure is normal.  The left ventricular ejection fraction is 55-65% by visual estimate. IMPRESSION: Ms. Breunig has a widely patent mid LAD stent and otherwise no evidence of CAD.  Her anatomy is essentially unchanged from her prior cath performed by myself 2/20.  I believe her chest pain is noncardiac after Myoview  false positive.   )         Anesthesia Quick Evaluation  "

## 2024-02-19 NOTE — Progress Notes (Signed)
 Anesthesia Chart Review: Same day workup  74 yo female follows with cardiology for hx of CAD s/p DES to mLAD 2017, HTN, HLD. Had cath in 2020 for chest pain, 2022 after abnormal stress test. Caths both showed patent mLAD stent and no other CAD. Most recent echo 2022 with EF 60-65%, normal diastolic function, no significant valve disease. Last seen by Dr. Lonni 12/29/23. Bisoprolol  dc'd due to intermittent hypotension. No other changes to management, 1 year followup recommended.   Follows with pulmonology for hx of emphysema +/- occupational asthma +/- radiation pneumonitis. Symptoms usually worsen seasonally. Controlled on LAMA/LABA. Last seen by Dr. Kassie 10/29/23 and noted to have laryngitis at that time, treated with course of steroids.  Other pertinent hx includes PONV, IDDM2 (A1c 9.0 on 12/25/23), GERD on PPI, hypothyroidism, uterine cancer ( s/p TAH/BSO), left breast cancer (s/p left breast lumpectomy 10/07/19, chemoradiation; prior RIJ Port-a-cath).  She will need day of surgery labs and evaluation.   EKG 12/29/23: NSR. Rate 78. LAD. Voltage criteria for LVH.  PFTs 08/10/20: FVC 2.61 (87%) FEV1 2.39 (105%) Ratio 87  TLC 88% DLCO 117% Interpretation: Normal spirometry. Increased DLCO suggestive of asthma No significant bronchodilator response    Echo 11/27/20: IMPRESSIONS   1. Global longitudinal strain is -23.3%. Left ventricular ejection  fraction, by estimation, is 60 to 65%. The left ventricle has normal  function. The left ventricle has no regional wall motion abnormalities.  Left ventricular diastolic parameters were  normal.   2. Right ventricular systolic function is normal. The right ventricular  size is normal.   3. Trivial mitral valve regurgitation.   4. The aortic valve is tricuspid. Aortic valve regurgitation is trivial.  Aortic valve sclerosis is present, with no evidence of aortic valve  stenosis.   5. Aortic dilatation noted. There is mild dilatation of the  ascending  aorta, measuring 38 mm.   6. The inferior vena cava is normal in size with greater than 50%  respiratory variability, suggesting right atrial pressure of 3 mmHg.    Cardiac cath 05/01/20: Previously placed Mid LAD stent (unknown type) is widely patent. The left ventricular systolic function is normal. LV end diastolic pressure is normal. The left ventricular ejection fraction is 55-65% by visual estimate. IMPRESSION: Ms. Camerer has a widely patent mid LAD stent and otherwise no evidence of CAD.  Her anatomy is essentially unchanged from her prior cath performed by myself 2/20.  I believe her chest pain is noncardiac after Myoview  false positive.       Lynwood Geofm RIGGERS [facility] Short Stay Center/Anesthesiology Phone [phone] 02/19/2024 9:23 AM

## 2024-02-20 ENCOUNTER — Other Ambulatory Visit (HOSPITAL_COMMUNITY): Payer: Self-pay

## 2024-02-20 ENCOUNTER — Encounter (HOSPITAL_COMMUNITY): Payer: Self-pay | Admitting: Orthopedic Surgery

## 2024-02-20 ENCOUNTER — Ambulatory Visit (HOSPITAL_COMMUNITY)
Admission: RE | Admit: 2024-02-20 | Discharge: 2024-02-20 | Disposition: A | Attending: Orthopedic Surgery | Admitting: Orthopedic Surgery

## 2024-02-20 ENCOUNTER — Encounter (HOSPITAL_COMMUNITY): Admitting: Physician Assistant

## 2024-02-20 ENCOUNTER — Ambulatory Visit (HOSPITAL_COMMUNITY)

## 2024-02-20 ENCOUNTER — Encounter (HOSPITAL_COMMUNITY): Admission: RE | Disposition: A | Payer: Self-pay | Source: Home / Self Care | Attending: Orthopedic Surgery

## 2024-02-20 DIAGNOSIS — Z969 Presence of functional implant, unspecified: Secondary | ICD-10-CM

## 2024-02-20 DIAGNOSIS — M25571 Pain in right ankle and joints of right foot: Secondary | ICD-10-CM

## 2024-02-20 LAB — BASIC METABOLIC PANEL WITH GFR
Anion gap: 10 (ref 5–15)
BUN: 15 mg/dL (ref 8–23)
CO2: 25 mmol/L (ref 22–32)
Calcium: 9.9 mg/dL (ref 8.9–10.3)
Chloride: 104 mmol/L (ref 98–111)
Creatinine, Ser: 0.8 mg/dL (ref 0.44–1.00)
GFR, Estimated: >60 mL/min
Glucose, Bld: 152 mg/dL — ABNORMAL HIGH (ref 70–99)
Potassium: 4.1 mmol/L (ref 3.5–5.1)
Sodium: 139 mmol/L (ref 135–145)

## 2024-02-20 LAB — CBC
HCT: 45.3 % (ref 36.0–46.0)
Hemoglobin: 14.6 g/dL (ref 12.0–15.0)
MCH: 31.1 pg (ref 26.0–34.0)
MCHC: 32.2 g/dL (ref 30.0–36.0)
MCV: 96.6 fL (ref 80.0–100.0)
Platelets: 247 10*3/uL (ref 150–400)
RBC: 4.69 MIL/uL (ref 3.87–5.11)
RDW: 14.3 % (ref 11.5–15.5)
WBC: 18.2 10*3/uL — ABNORMAL HIGH (ref 4.0–10.5)
nRBC: 0.2 % (ref 0.0–0.2)

## 2024-02-20 LAB — SURGICAL PCR SCREEN
MRSA, PCR: NEGATIVE
Staphylococcus aureus: NEGATIVE

## 2024-02-20 LAB — GLUCOSE, CAPILLARY
Glucose-Capillary: 155 mg/dL — ABNORMAL HIGH (ref 70–99)
Glucose-Capillary: 142 mg/dL — ABNORMAL HIGH (ref 70–99)

## 2024-02-20 MED ORDER — FENTANYL CITRATE (PF) 100 MCG/2ML IJ SOLN
INTRAMUSCULAR | Status: AC
  Filled 2024-02-20: qty 2

## 2024-02-20 MED ORDER — FENTANYL CITRATE (PF) 100 MCG/2ML IJ SOLN
25.0000 ug | INTRAMUSCULAR | Status: DC | PRN
  Administered 2024-02-20 (×2): 25 ug via INTRAVENOUS

## 2024-02-20 MED ORDER — CHLORHEXIDINE GLUCONATE 0.12 % MT SOLN: 15.0000 mL | Freq: Once | OROMUCOSAL | Status: DC

## 2024-02-20 MED ORDER — FENTANYL CITRATE (PF) 100 MCG/2ML IJ SOLN
INTRAMUSCULAR | Status: DC | PRN
  Administered 2024-02-20: 50 ug via INTRAVENOUS

## 2024-02-20 MED ORDER — DEXAMETHASONE SOD PHOSPHATE PF 10 MG/ML IJ SOLN
INTRAMUSCULAR | Status: DC | PRN
  Administered 2024-02-20: 4 mg via INTRAVENOUS

## 2024-02-20 MED ORDER — CEFAZOLIN SODIUM-DEXTROSE 2-4 GM/100ML-% IV SOLN
2.0000 g | INTRAVENOUS | Status: AC
  Administered 2024-02-20: 2 g via INTRAVENOUS
  Filled 2024-02-20: qty 100

## 2024-02-20 MED ORDER — INSULIN ASPART 100 UNIT/ML IJ SOLN: 0.0000 [IU] | INTRAMUSCULAR | Status: DC | PRN

## 2024-02-20 MED ORDER — OXYCODONE HCL 5 MG PO TABS: 5.0000 mg | ORAL_TABLET | Freq: Once | ORAL | Status: DC | PRN

## 2024-02-20 MED ORDER — PROPOFOL 10 MG/ML IV BOLUS
INTRAVENOUS | Status: DC | PRN
  Administered 2024-02-20: 200 mg via INTRAVENOUS

## 2024-02-20 MED ORDER — VASHE WOUND IRRIGATION OPTIME
TOPICAL | Status: DC | PRN
  Administered 2024-02-20: 34 [oz_av]

## 2024-02-20 MED ORDER — LIDOCAINE 2% (20 MG/ML) 5 ML SYRINGE
INTRAMUSCULAR | Status: DC | PRN
  Administered 2024-02-20: 40 mg via INTRAVENOUS

## 2024-02-20 MED ORDER — ONDANSETRON HCL 4 MG/2ML IJ SOLN
INTRAMUSCULAR | Status: DC | PRN
  Administered 2024-02-20: 4 mg via INTRAVENOUS

## 2024-02-20 MED ORDER — PROPOFOL 10 MG/ML IV BOLUS
INTRAVENOUS | Status: AC
  Filled 2024-02-20: qty 20

## 2024-02-20 MED ORDER — HYDROCODONE-ACETAMINOPHEN 5-325 MG PO TABS
1.0000 | ORAL_TABLET | ORAL | 0 refills | Status: AC | PRN
  Filled 2024-02-20: qty 30, 5d supply, fill #0

## 2024-02-20 MED ORDER — ORAL CARE MOUTH RINSE: 15.0000 mL | Freq: Once | OROMUCOSAL | Status: DC

## 2024-02-20 MED ORDER — LACTATED RINGERS IV SOLN: INTRAVENOUS | Status: DC

## 2024-02-20 MED ORDER — ACETAMINOPHEN 10 MG/ML IV SOLN
1000.0000 mg | Freq: Once | INTRAVENOUS | Status: DC | PRN
  Administered 2024-02-20: 1000 mg via INTRAVENOUS

## 2024-02-20 MED ORDER — OXYCODONE HCL 5 MG/5ML PO SOLN: 5.0000 mg | Freq: Once | ORAL | Status: DC | PRN

## 2024-02-20 NOTE — Transfer of Care (Addendum)
 Immediate Anesthesia Transfer of Care Note  Patient: Felicia [name]  Procedure(s) Performed: REMOVAL, HARDWARE (Right)  Patient Location: PACU  Anesthesia Type:General  Level of Consciousness: drowsy and patient cooperative  Airway & Oxygen Therapy: Patient Spontanous Breathing and Patient connected to face mask oxygen  Post-op Assessment: Report given to RN, Post -op Vital signs reviewed and stable, and Patient moving all extremities X 4  Post vital signs: Reviewed and stable  Last Vitals:  Vitals Value Taken Time  BP 134/79 02/20/24 08:09  Temp    Pulse 103 02/20/24 08:11  Resp 26 02/20/24 08:11  SpO2 91 % 02/20/24 08:11  Vitals shown include unfiled device data.  Last Pain:  Vitals:   02/20/24 0613  TempSrc: Oral  PainSc: 6          Complications: No notable events documented.

## 2024-02-20 NOTE — Discharge Instructions (Signed)
 Weight bearing as tolerates in cam boot.  Elevate your legs to reduce swelling and pain.  Keep dressing clean and dry.

## 2024-02-20 NOTE — Progress Notes (Signed)
 50 mcg IV fentanyl  wasted in stericycle with Morna BROCKS RN after patient discharged.

## 2024-02-20 NOTE — Op Note (Signed)
 02/20/2024  8:19 AM  PATIENT:  Felicia [name]    PRE-OPERATIVE DIAGNOSIS:  Painful Deep Retained Hardware Right Ankle  POST-OPERATIVE DIAGNOSIS:  Same  PROCEDURE:  REMOVAL DEEP HARDWARE  SURGEON:  Jerona LULLA Sage, MD  PHYSICIAN ASSISTANT:None ANESTHESIA:   General  PREOPERATIVE INDICATIONS:  [name] is a  74 y.o. female with a diagnosis of Painful Deep Retained Hardware Right Ankle who failed conservative measures and elected for surgical management.    The risks benefits and alternatives were discussed with the patient preoperatively including but not limited to the risks of infection, bleeding, nerve injury, cardiopulmonary complications, the need for revision surgery, among others, and the patient was willing to proceed.  OPERATIVE IMPLANTS:   * No implants in log *  @ENCIMAGES @  OPERATIVE FINDINGS: Distal screws had loosened.  No signs of infection.  OPERATIVE PROCEDURE: Patient brought the operating room and underwent a general anesthetic.  After adequate levels anesthesia were obtained patient's right lower extremity was prepped using DuraPrep draped into a sterile field a timeout was called.  A lateral incision was made over the fibula and this was carried down to bone.  The periosteal elevator was used to remove fibrinous tissue from the plate and screws.  All screws and hardware were removed without complication.  The wound was irrigated with Vashe.  Incision closed using 2-0 nylon a sterile dressing was applied patient was extubated taken the PACU in stable condition.   DISCHARGE PLANNING:  Antibiotic duration: Preoperative antibiotics  Weightbearing: Weightbearing as tolerated in a cam boot  Pain medication: Prescription for Vicodin  Dressing care/ Wound VAC: Dry dressing change and follow-up  Ambulatory devices: Patient has a walker  Discharge to: Discharge to home  Follow-up: In the office 1 week post operative.

## 2024-02-20 NOTE — Interval H&P Note (Signed)
 History and Physical Interval Note:  02/20/2024 6:41 AM  Felicia [name]  has presented today for surgery, with the diagnosis of Painful Deep Retained Hardware Right Ankle.  The various methods of treatment have been discussed with the patient and family. After consideration of risks, benefits and other options for treatment, the patient has consented to  Procedures with comments: REMOVAL, HARDWARE (Right) - HARDWARE REMOVAL RIGHT ANKLE as a surgical intervention.  The patient's history has been reviewed, patient examined, no change in status, stable for surgery.  I have reviewed the patient's chart and labs.  Questions were answered to the patient's satisfaction.     Felicia [name]

## 2024-02-20 NOTE — Anesthesia Procedure Notes (Signed)
 Procedure Name: LMA Insertion Date/Time: 02/20/2024 7:36 AM  Performed by: Lamar Lucie DASEN, CRNAPre-anesthesia Checklist: Patient identified, Emergency Drugs available, Suction available and Patient being monitored Patient Re-evaluated:Patient Re-evaluated prior to induction Oxygen Delivery Method: Circle system utilized Preoxygenation: Pre-oxygenation with 100% oxygen Induction Type: IV induction Ventilation: Mask ventilation without difficulty LMA: LMA inserted LMA Size: 4.0 Number of attempts: 1 Airway Equipment and Method: Oral airway Placement Confirmation: positive ETCO2, breath sounds checked- equal and bilateral and CO2 detector Tube secured with: Tape Dental Injury: Teeth and Oropharynx as per pre-operative assessment

## 2024-03-02 ENCOUNTER — Encounter: Admitting: Gastroenterology

## 2024-03-25 ENCOUNTER — Ambulatory Visit: Admitting: Physician Assistant

## 2024-03-30 ENCOUNTER — Other Ambulatory Visit: Admitting: Pharmacist

## 2024-04-06 ENCOUNTER — Ambulatory Visit: Admitting: Neurology

## 2024-04-07 ENCOUNTER — Ambulatory Visit: Admitting: Endocrinology

## 2024-06-29 ENCOUNTER — Ambulatory Visit

## 2024-07-05 ENCOUNTER — Ambulatory Visit

## 2024-09-21 ENCOUNTER — Inpatient Hospital Stay: Admitting: Hematology and Oncology

## 2024-09-21 ENCOUNTER — Ambulatory Visit: Admitting: Hematology and Oncology

## 2024-12-06 ENCOUNTER — Ambulatory Visit
